# Patient Record
Sex: Female | Born: 1965
Health system: Southern US, Community
[De-identification: ages and names within clinical notes are randomized; demographics above are authoritative.]

## PROBLEM LIST (undated history)

## (undated) DIAGNOSIS — G4730 Sleep apnea, unspecified: Secondary | ICD-10-CM

## (undated) DIAGNOSIS — J45909 Unspecified asthma, uncomplicated: Secondary | ICD-10-CM

## (undated) DIAGNOSIS — R600 Localized edema: Secondary | ICD-10-CM

## (undated) DIAGNOSIS — E7800 Pure hypercholesterolemia, unspecified: Secondary | ICD-10-CM

## (undated) DIAGNOSIS — K5900 Constipation, unspecified: Secondary | ICD-10-CM

## (undated) DIAGNOSIS — Z9221 Personal history of antineoplastic chemotherapy: Secondary | ICD-10-CM

## (undated) DIAGNOSIS — R634 Abnormal weight loss: Secondary | ICD-10-CM

## (undated) DIAGNOSIS — E559 Vitamin D deficiency, unspecified: Secondary | ICD-10-CM

## (undated) DIAGNOSIS — C50919 Malignant neoplasm of unspecified site of unspecified female breast: Secondary | ICD-10-CM

## (undated) DIAGNOSIS — M549 Dorsalgia, unspecified: Secondary | ICD-10-CM

## (undated) DIAGNOSIS — Z91018 Allergy to other foods: Secondary | ICD-10-CM

## (undated) DIAGNOSIS — F419 Anxiety disorder, unspecified: Secondary | ICD-10-CM

## (undated) DIAGNOSIS — N938 Other specified abnormal uterine and vaginal bleeding: Secondary | ICD-10-CM

## (undated) DIAGNOSIS — Z923 Personal history of irradiation: Secondary | ICD-10-CM

## (undated) DIAGNOSIS — Z8742 Personal history of other diseases of the female genital tract: Secondary | ICD-10-CM

## (undated) DIAGNOSIS — R112 Nausea with vomiting, unspecified: Secondary | ICD-10-CM

## (undated) DIAGNOSIS — M2550 Pain in unspecified joint: Secondary | ICD-10-CM

## (undated) DIAGNOSIS — I10 Essential (primary) hypertension: Secondary | ICD-10-CM

## (undated) DIAGNOSIS — R638 Other symptoms and signs concerning food and fluid intake: Secondary | ICD-10-CM

## (undated) DIAGNOSIS — R0602 Shortness of breath: Secondary | ICD-10-CM

## (undated) DIAGNOSIS — E669 Obesity, unspecified: Secondary | ICD-10-CM

## (undated) HISTORY — DX: Pure hypercholesterolemia, unspecified: E78.00

## (undated) HISTORY — DX: Nausea with vomiting, unspecified: R11.2

## (undated) HISTORY — DX: Sleep apnea, unspecified: G47.30

## (undated) HISTORY — DX: Malignant neoplasm of unspecified site of unspecified female breast: C50.919

## (undated) HISTORY — DX: Allergy to other foods: Z91.018

## (undated) HISTORY — DX: Other specified abnormal uterine and vaginal bleeding: N93.8

## (undated) HISTORY — DX: Pain in unspecified joint: M25.50

## (undated) HISTORY — DX: Obesity, unspecified: E66.9

## (undated) HISTORY — DX: Shortness of breath: R06.02

## (undated) HISTORY — PX: REDUCTION MAMMAPLASTY: SUR839

## (undated) HISTORY — DX: Anxiety disorder, unspecified: F41.9

## (undated) HISTORY — DX: Dorsalgia, unspecified: M54.9

## (undated) HISTORY — DX: Constipation, unspecified: K59.00

## (undated) HISTORY — DX: Abnormal weight loss: R63.4

## (undated) HISTORY — DX: Personal history of other diseases of the female genital tract: Z87.42

## (undated) HISTORY — DX: Localized edema: R60.0

## (undated) HISTORY — PX: BREAST LUMPECTOMY: SHX2

## (undated) HISTORY — DX: Other symptoms and signs concerning food and fluid intake: R63.8

## (undated) HISTORY — DX: Unspecified asthma, uncomplicated: J45.909

## (undated) HISTORY — DX: Vitamin D deficiency, unspecified: E55.9

## (undated) MED FILL — Dexamethasone Sodium Phosphate Inj 100 MG/10ML: INTRAMUSCULAR | Qty: 1 | Status: AC

## (undated) MED FILL — Potassium Chloride Inj 10 mEq/100ML: INTRAVENOUS | Qty: 100 | Status: AC

## (undated) MED FILL — Fosaprepitant Dimeglumine For IV Infusion 150 MG (Base Eq): INTRAVENOUS | Qty: 5 | Status: AC

---

## 1991-05-04 HISTORY — PX: BREAST REDUCTION SURGERY: SHX8

## 1997-10-28 ENCOUNTER — Other Ambulatory Visit: Admission: RE | Admit: 1997-10-28 | Discharge: 1997-10-28 | Payer: Self-pay | Admitting: Obstetrics

## 1998-01-05 ENCOUNTER — Emergency Department (HOSPITAL_COMMUNITY): Admission: EM | Admit: 1998-01-05 | Discharge: 1998-01-06 | Payer: Self-pay | Admitting: Emergency Medicine

## 1998-02-12 ENCOUNTER — Inpatient Hospital Stay (HOSPITAL_COMMUNITY): Admission: AD | Admit: 1998-02-12 | Discharge: 1998-02-12 | Payer: Self-pay | Admitting: Obstetrics

## 1999-03-14 ENCOUNTER — Emergency Department (HOSPITAL_COMMUNITY): Admission: EM | Admit: 1999-03-14 | Discharge: 1999-03-15 | Payer: Self-pay | Admitting: Emergency Medicine

## 1999-09-20 ENCOUNTER — Encounter: Payer: Self-pay | Admitting: Obstetrics

## 1999-09-20 ENCOUNTER — Inpatient Hospital Stay (HOSPITAL_COMMUNITY): Admission: EM | Admit: 1999-09-20 | Discharge: 1999-09-20 | Payer: Self-pay | Admitting: *Deleted

## 1999-10-03 ENCOUNTER — Encounter: Admission: RE | Admit: 1999-10-03 | Discharge: 1999-10-03 | Payer: Self-pay | Admitting: Obstetrics

## 2000-11-02 ENCOUNTER — Emergency Department (HOSPITAL_COMMUNITY): Admission: EM | Admit: 2000-11-02 | Discharge: 2000-11-02 | Payer: Self-pay | Admitting: Emergency Medicine

## 2000-11-05 ENCOUNTER — Other Ambulatory Visit: Admission: RE | Admit: 2000-11-05 | Discharge: 2000-11-05 | Payer: Self-pay | Admitting: Family Medicine

## 2000-11-14 ENCOUNTER — Encounter: Admission: RE | Admit: 2000-11-14 | Discharge: 2000-11-14 | Payer: Self-pay | Admitting: Family Medicine

## 2000-11-14 ENCOUNTER — Encounter: Payer: Self-pay | Admitting: Family Medicine

## 2000-11-21 ENCOUNTER — Encounter: Payer: Self-pay | Admitting: Family Medicine

## 2000-11-21 ENCOUNTER — Ambulatory Visit (HOSPITAL_COMMUNITY): Admission: RE | Admit: 2000-11-21 | Discharge: 2000-11-21 | Payer: Self-pay | Admitting: Family Medicine

## 2001-11-08 ENCOUNTER — Encounter: Payer: Self-pay | Admitting: Emergency Medicine

## 2001-11-08 ENCOUNTER — Emergency Department (HOSPITAL_COMMUNITY): Admission: EM | Admit: 2001-11-08 | Discharge: 2001-11-08 | Payer: Self-pay | Admitting: Emergency Medicine

## 2003-02-11 ENCOUNTER — Other Ambulatory Visit: Admission: RE | Admit: 2003-02-11 | Discharge: 2003-02-11 | Payer: Self-pay | Admitting: Family Medicine

## 2003-07-10 ENCOUNTER — Emergency Department (HOSPITAL_COMMUNITY): Admission: EM | Admit: 2003-07-10 | Discharge: 2003-07-10 | Payer: Self-pay | Admitting: Emergency Medicine

## 2004-06-16 ENCOUNTER — Emergency Department (HOSPITAL_COMMUNITY): Admission: EM | Admit: 2004-06-16 | Discharge: 2004-06-16 | Payer: Self-pay | Admitting: Emergency Medicine

## 2005-01-18 ENCOUNTER — Inpatient Hospital Stay (HOSPITAL_COMMUNITY): Admission: AD | Admit: 2005-01-18 | Discharge: 2005-01-19 | Payer: Self-pay | Admitting: Obstetrics & Gynecology

## 2005-03-01 ENCOUNTER — Other Ambulatory Visit: Admission: RE | Admit: 2005-03-01 | Discharge: 2005-03-01 | Payer: Self-pay | Admitting: Family Medicine

## 2005-03-01 ENCOUNTER — Ambulatory Visit: Payer: Self-pay | Admitting: Family Medicine

## 2005-03-05 DIAGNOSIS — N938 Other specified abnormal uterine and vaginal bleeding: Secondary | ICD-10-CM

## 2005-03-05 DIAGNOSIS — E669 Obesity, unspecified: Secondary | ICD-10-CM

## 2005-03-05 DIAGNOSIS — Z8742 Personal history of other diseases of the female genital tract: Secondary | ICD-10-CM

## 2005-03-05 HISTORY — DX: Other specified abnormal uterine and vaginal bleeding: N93.8

## 2005-03-05 HISTORY — DX: Personal history of other diseases of the female genital tract: Z87.42

## 2005-03-05 HISTORY — DX: Obesity, unspecified: E66.9

## 2005-03-22 ENCOUNTER — Ambulatory Visit (HOSPITAL_COMMUNITY): Admission: RE | Admit: 2005-03-22 | Discharge: 2005-03-22 | Payer: Self-pay | Admitting: Family Medicine

## 2005-03-29 ENCOUNTER — Ambulatory Visit: Payer: Self-pay | Admitting: Family Medicine

## 2005-03-31 DIAGNOSIS — I10 Essential (primary) hypertension: Secondary | ICD-10-CM

## 2005-03-31 HISTORY — DX: Essential (primary) hypertension: I10

## 2005-05-13 ENCOUNTER — Emergency Department (HOSPITAL_COMMUNITY): Admission: EM | Admit: 2005-05-13 | Discharge: 2005-05-13 | Payer: Self-pay | Admitting: Family Medicine

## 2006-02-04 ENCOUNTER — Emergency Department (HOSPITAL_COMMUNITY): Admission: EM | Admit: 2006-02-04 | Discharge: 2006-02-04 | Payer: Self-pay | Admitting: Emergency Medicine

## 2006-04-30 ENCOUNTER — Ambulatory Visit: Admission: AD | Admit: 2006-04-30 | Discharge: 2006-04-30 | Payer: Self-pay | Admitting: Obstetrics and Gynecology

## 2006-04-30 ENCOUNTER — Encounter (INDEPENDENT_AMBULATORY_CARE_PROVIDER_SITE_OTHER): Payer: Self-pay | Admitting: Specialist

## 2006-05-01 DIAGNOSIS — Z8742 Personal history of other diseases of the female genital tract: Secondary | ICD-10-CM

## 2006-05-01 HISTORY — DX: Personal history of other diseases of the female genital tract: Z87.42

## 2006-05-01 HISTORY — PX: HYSTEROSCOPY: SHX211

## 2006-06-11 ENCOUNTER — Emergency Department (HOSPITAL_COMMUNITY): Admission: EM | Admit: 2006-06-11 | Discharge: 2006-06-11 | Payer: Self-pay | Admitting: Family Medicine

## 2006-07-11 DIAGNOSIS — R638 Other symptoms and signs concerning food and fluid intake: Secondary | ICD-10-CM

## 2006-07-11 DIAGNOSIS — R634 Abnormal weight loss: Secondary | ICD-10-CM

## 2006-07-11 HISTORY — DX: Abnormal weight loss: R63.4

## 2006-07-11 HISTORY — DX: Other symptoms and signs concerning food and fluid intake: R63.8

## 2006-08-10 ENCOUNTER — Encounter: Admission: RE | Admit: 2006-08-10 | Discharge: 2006-08-10 | Payer: Self-pay | Admitting: Family Medicine

## 2006-09-24 ENCOUNTER — Emergency Department (HOSPITAL_COMMUNITY): Admission: EM | Admit: 2006-09-24 | Discharge: 2006-09-24 | Payer: Self-pay | Admitting: Emergency Medicine

## 2009-05-16 ENCOUNTER — Encounter: Admission: RE | Admit: 2009-05-16 | Discharge: 2009-05-16 | Payer: Self-pay | Admitting: Obstetrics and Gynecology

## 2010-05-11 ENCOUNTER — Inpatient Hospital Stay (INDEPENDENT_AMBULATORY_CARE_PROVIDER_SITE_OTHER)
Admission: RE | Admit: 2010-05-11 | Discharge: 2010-05-11 | Disposition: A | Discharge status: Home / Self Care | Payer: 59 | Source: Ambulatory Visit | Attending: Family Medicine | Admitting: Family Medicine

## 2010-05-11 ENCOUNTER — Emergency Department (HOSPITAL_COMMUNITY): Payer: 59

## 2010-05-11 ENCOUNTER — Emergency Department (HOSPITAL_COMMUNITY)
Admission: EM | Admit: 2010-05-11 | Discharge: 2010-05-11 | Disposition: A | Discharge status: Home / Self Care | Payer: 59 | Attending: Emergency Medicine | Admitting: Emergency Medicine

## 2010-05-11 DIAGNOSIS — M6281 Muscle weakness (generalized): Secondary | ICD-10-CM

## 2010-05-11 DIAGNOSIS — G43909 Migraine, unspecified, not intractable, without status migrainosus: Secondary | ICD-10-CM | POA: Insufficient documentation

## 2010-05-11 DIAGNOSIS — Z79899 Other long term (current) drug therapy: Secondary | ICD-10-CM | POA: Insufficient documentation

## 2010-05-11 DIAGNOSIS — G43009 Migraine without aura, not intractable, without status migrainosus: Secondary | ICD-10-CM

## 2010-05-11 DIAGNOSIS — R112 Nausea with vomiting, unspecified: Secondary | ICD-10-CM | POA: Insufficient documentation

## 2010-05-11 DIAGNOSIS — I10 Essential (primary) hypertension: Secondary | ICD-10-CM | POA: Insufficient documentation

## 2010-05-11 DIAGNOSIS — H9319 Tinnitus, unspecified ear: Secondary | ICD-10-CM | POA: Insufficient documentation

## 2010-05-11 DIAGNOSIS — H53149 Visual discomfort, unspecified: Secondary | ICD-10-CM | POA: Insufficient documentation

## 2010-05-11 LAB — DIFFERENTIAL
Lymphocytes Relative: 22 % (ref 12–46)
Lymphs Abs: 2.4 10*3/uL (ref 0.7–4.0)
Monocytes Relative: 8 % (ref 3–12)
Neutro Abs: 7.3 10*3/uL (ref 1.7–7.7)

## 2010-05-11 LAB — POCT I-STAT, CHEM 8
Chloride: 108 mEq/L (ref 96–112)
Creatinine, Ser: 1.1 mg/dL (ref 0.4–1.2)
HCT: 37 % (ref 36.0–46.0)
Potassium: 4.4 mEq/L (ref 3.5–5.1)
Sodium: 136 mEq/L (ref 135–145)

## 2010-05-11 LAB — URINALYSIS, ROUTINE W REFLEX MICROSCOPIC: Ketones, ur: NEGATIVE mg/dL

## 2010-05-11 LAB — CBC
HCT: 37 % (ref 36.0–46.0)
MCHC: 32.7 g/dL (ref 30.0–36.0)
Platelets: 234 10*3/uL (ref 150–400)

## 2010-07-21 NOTE — Group Therapy Note (Signed)
NAME:  Pamela [name], Pamela [name] NO.:  [id number]   MEDICAL RECORD NO.:  [id number]           PATIENT TYPE:   LOCATION:  [facility]                     FACILITY:   PHYSICIAN:  Argentina Donovan, MD             DATE OF BIRTH:   DATE OF SERVICE:  03/31/2005                                    CLINIC NOTE   CHIEF COMPLAINT:  Follow-up laboratory tests and ultrasound.   SUBJECTIVE:  Patient is a 45 year old gravida 4, para 2-0-2-2 who had been  seen in clinic on March 01, 2005 with abnormal uterine bleeding.  This  was typified by menometrorrhagia.  In the past she had tried birth control  pills with limited success.  In November she had had very heavy bleeding,  however, had been started on progesterone but she stated the progesterone  did not work for her and she had three months of almost continuous spotting.  This prompted her to come to the GYN clinic last month.  At that time the  patient underwent an endometrial biopsy, some laboratory work, and an  ultrasound.   The results of these tests are as follows:  The endometrial biopsy shows no  evidence of a malignancy.  It does show proliferative type endometrial  glands.  She had a TSH which was normal and an FSH which was 5.6 and normal  and not premenopausal.  Patient underwent a repeat ultrasound.  This was  done because the patient on her previous ultrasound obtained when she went  to the emergency department in November showed a thickened heterogeneous  endometrium and also a 2.7 x 2.4 x 2.6 cm complex cyst in the right ovary.  The follow-up ultrasound shows a normal appearance of the uterus and both  ovaries.  This shows a resolution of the right ovarian cyst.  It also shows  a thin endometrium of 4 mm.   The patient has been on Ortho-Cyclen since we saw her in clinic last.  She  states that the bleeding has subsequently stopped now that she has been on  the birth control pills for the last two months.  She is currently  having no  problems and is having normal cycles.   OBJECTIVE:  VITAL SIGNS:  Temperature 99.2, pulse 79, blood pressure  149/100, weight 280.2 pounds, height 5 feet 7.5 inches.   Physical examination was deferred.  Spent approximately 20 minutes in  discussion with the patient about her laboratory results.   IMPRESSION:  1.  Dysfunctional uterine bleeding secondary to hormonal imbalance.  2.  Obesity.  3.  Hypertension.  4.  Ovarian cysts.   PLAN:  Abnormal uterine bleeding seems to be controlled by her Ortho-Cyclen.  Instructed the patient to continue the pills as directed.  Instructed that  if in the future patient has problems with abnormal vaginal bleeding that is  not regulated by birth control pills we can try other types of hormonal  methods and if that is insufficient could pursue options such as endometrial  ablation.  Advised the patient to come back as needed for abnormal bleeding,  but  continue birth control pills at the present time.  Ovarian cysts:  This is  resolved on the subsequent follow-up ultrasound and needs no further follow-  up.     ______________________________  Tanya Nones, M.D.    ______________________________  Argentina Donovan, MD    /MEDQ  D:  03/29/2005  T:  03/30/2005  Job:  [id number]

## 2010-07-21 NOTE — Op Note (Signed)
NAME:  Pamela [name], Pamela [name]            ACCOUNT NO.:  [id number]   MEDICAL RECORD NO.:  [id number]          PATIENT TYPE:  AMB   LOCATION:  [facility]                          FACILITY:  [facility]   PHYSICIAN:  Osborn Coho, M.D.   DATE OF BIRTH:  18-Sep-1965   DATE OF PROCEDURE:  05/01/2006  DATE OF DISCHARGE:                               OPERATIVE REPORT   PREOPERATIVE DIAGNOSIS:  Menorrhagia.   POSTOPERATIVE DIAGNOSIS:  Menorrhagia.   PROCEDURE:  1. Hysteroscopy.  2. Dilation and curettage.   ATTENDING:  Osborn Coho, M.D.   ANESTHESIA:  General via LMA.   SPECIMENS TO PATHOLOGY:  Endometrial curettings.   FLUID:  600 mL.   ESTIMATED BLOOD LOSS:  Minimal.   URINE OUTPUT:  Quantity sufficient via straight cath prior to procedure.   HYSTEROSCOPIC FLUID DEFICIT:  Sorbitol 60 mL   COMPLICATIONS:  None.   PROCEDURE:  The patient was taken to the operating room after risks,  benefits, and alternatives reviewed with the patient.  The patient  verbalized understanding, consent signed and witnessed.  The patient was  placed under general anesthesia and prepped and draped in the normal  sterile fashion in the dorsal lithotomy position.  A bivalve speculum  placed in the patient's vagina and a paracervical block administered.  The cervix was dilated for passage of the diagnostic hysteroscope after  sounding the uterus to 9 cm.  Diagnostic hysteroscope was introduced and  the patient was bleeding a fair amount and visualization was not  optimal; however, no obvious lesions were noted.  Curettage was  performed until a gritty texture noted and curettings sent to pathology.  The diagnostic hysteroscope  introduced again and slightly better visualization and still no obvious  intracavitary lesions.  All instruments were removed.  There was good  hemostasis at tenaculum site.  There was minimal bleeding.  The patient  tolerated procedure well.  Count was correct and the patient was  returned  to recovery room in good condition.      Osborn Coho, M.D.  Electronically Signed     AR/MEDQ  D:  05/01/2006  T:  05/01/2006  Job:  [id number]

## 2010-07-21 NOTE — Group Therapy Note (Signed)
NAME:  Pamela [name], Pamela [name] NO.:  [id number]   MEDICAL RECORD NO.:  [id number]          PATIENT TYPE:  WOC   LOCATION:  [facility]                   FACILITY:  [facility]   PHYSICIAN:  Tinnie Gens, MD        DATE OF BIRTH:  Nov 09, 1965   DATE OF SERVICE:                                    CLINIC NOTE   CHIEF COMPLAINT:  Heavy vaginal bleeding.   HISTORY OF PRESENT ILLNESS:  Patient is a 45 year old gravida 4, para 2-0-2-  2, who has previously had very regular cycles that came once a month, lasted  for five days, since age 5.  Since October, the patient has had  increasingly heavy cycles with abnormal bleeding.  She was seen at the  health department in November of this year and given a pack of pills along  with a Pap smear for her abnormal bleeding, which did not really help.  Her  bleeding continued.  She came in to the [facility] on November 16 and had  heavy bleeding and was started on Megace, which stopped it for five days,  then she could no longer afford that.  She was switched to Provera, and she  continued to have bleeding all through the month of November and most of  December, and it just stopped approximately three days ago.  This patient  does report having a history of this in the past.  She has had problems once  or twice in her lifetime with heavy cycles and abnormal flow, but then it  goes back to regular monthly cycles for her.  She denies any significant  weight change recently or any extra stress.  The patient does have some  light-headedness and dizziness, headaches upon standing, which she  attributes to her abnormal bleeding.   PAST MEDICAL HISTORY:  Significant for asthma, elevated blood pressure, and  elevated cholesterol for which she takes no medications.   PAST SURGICAL HISTORY:  She had a breast reduction and a C-section.   MEDICATIONS:  Ortho-Cyclen and multivitamin.   ALLERGIES:  IODINE and LIQUID CODEINE.   OBSTETRIC HISTORY:  She is a  G4, P2.  She has one vaginal delivery followed  by a C-section for fetal indications.   GYNECOLOGIC HISTORY:  Last Pap was November of 2006.  It was normal.  She  has no history of an abnormal Pap.   FAMILY HISTORY:  Diabetes, hypertension, breast and bone cancer in a  grandmother and aunt and her grandfather.   SOCIAL HISTORY:  No tobacco, alcohol, or drug use.  She is employed.  She  lives with her son and daughter.   REVIEW OF SYMPTOMS:  Fourteen point review of systems reviewed.  Please see  GYN history on the chart.  She has noted one bruise that stayed around  longer than usual.  Otherwise, the review of systems is reviewed in the HPI.   PHYSICAL EXAMINATION:  VITAL SIGNS:  Her blood pressure is 135/80, weight is  277.7, pulse is 68, temp 98.5, height 5 feet 7 and 1/2 inches tall.  GENERAL:  She is an obese black female  in no acute distress.  ABDOMEN:  Soft, non-tender, and non-distended.  No masses.  GENITOURINARY:  She has normal external female genitalia.  The vagina is  pink and rugated.  Cervix is parous and without lesion.  The uterus is very  firm, anteverted, and it feels like there may be an anterior fibroid.  The  adnexa could not be palpated secondary to body habitus.   Records are reviewed.  The patient had a thick heterogeneous endometrium and  a complex cyst on the right ovary on her January 19, 2005, pelvic  ultrasound.  She also had a hemoglobin of 10.2 in November as well.   PROCEDURE:  The cervix was cleaned with Betadine x2.  The uterus sounded to  approximately 9 cm.  Endometrial biopsy was obtained without difficulty.   IMPRESSION:  1.  Abnormal uterine bleeding.  2.  Obesity.  3.  History of hypertension.  4.  History of elevated cholesterol.  5.  Ovarian cyst.   PLAN:  1.  Endometrial biopsy today.  2.  Check TSH and FSH today.  3.  Follow-up in four weeks for a discussion of results and +/- any      treatments that are indicated.  4.  Will  follow up her pelvic ultrasound in two weeks.           ______________________________  Tinnie Gens, MD     TP/MEDQ  D:  03/01/2005  T:  03/01/2005  Job:  [id number]

## 2011-01-18 ENCOUNTER — Encounter (HOSPITAL_COMMUNITY): Payer: Self-pay | Admitting: Nurse Practitioner

## 2011-01-18 ENCOUNTER — Encounter: Payer: Self-pay | Admitting: *Deleted

## 2011-01-18 ENCOUNTER — Emergency Department (HOSPITAL_COMMUNITY)
Admission: EM | Admit: 2011-01-18 | Discharge: 2011-01-18 | Disposition: A | Discharge status: Home / Self Care | Payer: 59 | Attending: Emergency Medicine | Admitting: Emergency Medicine

## 2011-01-18 ENCOUNTER — Emergency Department (INDEPENDENT_AMBULATORY_CARE_PROVIDER_SITE_OTHER)
Admission: EM | Admit: 2011-01-18 | Discharge: 2011-01-18 | Disposition: A | Discharge status: Other Acute Inpatient Hospital | Payer: 59 | Source: Home / Self Care | Attending: Family Medicine | Admitting: Family Medicine

## 2011-01-18 DIAGNOSIS — R51 Headache: Secondary | ICD-10-CM

## 2011-01-18 DIAGNOSIS — G43909 Migraine, unspecified, not intractable, without status migrainosus: Secondary | ICD-10-CM | POA: Insufficient documentation

## 2011-01-18 DIAGNOSIS — I10 Essential (primary) hypertension: Secondary | ICD-10-CM

## 2011-01-18 DIAGNOSIS — H53149 Visual discomfort, unspecified: Secondary | ICD-10-CM | POA: Insufficient documentation

## 2011-01-18 DIAGNOSIS — R519 Headache, unspecified: Secondary | ICD-10-CM

## 2011-01-18 HISTORY — DX: Essential (primary) hypertension: I10

## 2011-01-18 MED ORDER — DEXAMETHASONE SODIUM PHOSPHATE 10 MG/ML IJ SOLN
10.0000 mg | Freq: Once | INTRAMUSCULAR | Status: AC
  Administered 2011-01-18: 10 mg via INTRAVENOUS
  Filled 2011-01-18: qty 1

## 2011-01-18 MED ORDER — METOCLOPRAMIDE HCL 5 MG/ML IJ SOLN
10.0000 mg | Freq: Once | INTRAMUSCULAR | Status: AC
  Administered 2011-01-18: 10 mg via INTRAVENOUS
  Filled 2011-01-18: qty 2

## 2011-01-18 MED ORDER — CLONIDINE HCL 0.1 MG PO TABS
0.1000 mg | ORAL_TABLET | Freq: Once | ORAL | Status: AC
  Administered 2011-01-18: 0.1 mg via ORAL
  Filled 2011-01-18: qty 1

## 2011-01-18 MED ORDER — ONDANSETRON 4 MG PO TBDP
4.0000 mg | ORAL_TABLET | Freq: Once | ORAL | Status: AC
  Administered 2011-01-18: 4 mg via ORAL

## 2011-01-18 MED ORDER — ONDANSETRON 4 MG PO TBDP
ORAL_TABLET | ORAL | Status: AC
  Filled 2011-01-18: qty 1

## 2011-01-18 MED ORDER — AMLODIPINE BESYLATE 5 MG PO TABS
5.0000 mg | ORAL_TABLET | Freq: Once | ORAL | Status: AC
  Administered 2011-01-18: 5 mg via ORAL
  Filled 2011-01-18: qty 1

## 2011-01-18 MED ORDER — DIPHENHYDRAMINE HCL 50 MG/ML IJ SOLN
25.0000 mg | Freq: Once | INTRAMUSCULAR | Status: AC
  Administered 2011-01-18: 25 mg via INTRAVENOUS
  Filled 2011-01-18: qty 1

## 2011-01-18 NOTE — ED Notes (Signed)
ASSUMED CARE ON PT. NO PAIN OR DISCOMFORT AT THIS TIME, RESPIRATIONS UNLABORED, INTRODUCED SELF , CALL LIGHT WITHIN REACH , BEDRAILS UP.

## 2011-01-18 NOTE — ED Notes (Signed)
Pt c/o migraine since 0300 today. Hx of migraines. Went to [facility] for treatment and they sent for HTN. Pt reports she has been taking BP meds as prescribed but did miss todays dose. No cp or SOB

## 2011-01-18 NOTE — ED Notes (Signed)
Report    Phoned  To  Paulino Rily

## 2011-01-18 NOTE — ED Provider Notes (Signed)
History     CSN: [id number] Arrival date & time: 01/18/2011  3:00 PM   First MD Initiated Contact with Patient 01/18/11 1523      Chief Complaint  Patient presents with  . Migraine    (Consider location/radiation/quality/duration/timing/severity/associated sxs/prior treatment) HPI Comments: Seen at urgent care as pt was unable to see her PCP and due to BP was sent here.  Patient is a 45 y.o. female presenting with migraine. The history is provided by the patient.  Migraine This is a recurrent problem. The current episode started 6 to 12 hours ago. The problem occurs constantly. The problem has been gradually worsening. Associated symptoms include headaches. Pertinent negatives include no chest pain and no shortness of breath. Exacerbated by: light, loud noise. The symptoms are relieved by nothing. She has tried acetaminophen, rest and a cold compress for the symptoms. The treatment provided no relief.    Past Medical History  Diagnosis Date  . Hypertension   . Migraine     Past Surgical History  Procedure Date  . Cesarean section   . Breast reduction surgery     History reviewed. No pertinent family history.  History  Substance Use Topics  . Smoking status: Never Smoker   . Smokeless tobacco: Not on file  . Alcohol Use: No    OB History    Grav Para Term Preterm Abortions TAB SAB Ect Mult Living                  Review of Systems  Eyes: Positive for photophobia. Negative for visual disturbance.  Respiratory: Negative for shortness of breath.   Cardiovascular: Negative for chest pain.  Neurological: Positive for headaches. Negative for speech difficulty, weakness, light-headedness and numbness.  All other systems reviewed and are negative.    Allergies  Codeine and Shellfish allergy  Home Medications   Current Outpatient Rx  Name Route Sig Dispense Refill  . AMLODIPINE-OLMESARTAN 5-40 MG PO TABS Oral Take 1 tablet by mouth daily.      Marland Kitchen VICTOZA [place]  Subcutaneous Inject 1.6 mg into the skin daily.     Marland Kitchen VITAMIN B-6 25 MG PO TABS Oral Take 25 mg by mouth 2 (two) times a week.      Marland Kitchen ROSUVASTATIN CALCIUM 40 MG PO TABS Oral Take 40 mg by mouth daily.        BP 219/106  Pulse 59  Temp(Src) 98.2 F (36.8 C) (Oral)  Ht 5\' 8"  (1.727 m)  Wt 307 lb (139.254 kg)  BMI 46.68 kg/m2  SpO2 98%  Physical Exam  Nursing note and vitals reviewed. Constitutional: She is oriented to person, place, and time. She appears well-developed and well-nourished. She appears distressed.  HENT:  Head: Normocephalic and atraumatic.  Eyes: EOM are normal. Pupils are equal, round, and reactive to light.  Fundoscopic exam:      The right eye shows no papilledema.       The left eye shows no papilledema.  Neck: Normal range of motion. Neck supple.  Cardiovascular: Normal rate, regular rhythm, normal heart sounds and intact distal pulses.  Exam reveals no friction rub.   No murmur heard. Pulmonary/Chest: Effort normal and breath sounds normal. She has no wheezes. She has no rales.  Abdominal: Soft. Bowel sounds are normal. She exhibits no distension. There is no tenderness. There is no rebound and no guarding.  Musculoskeletal: Normal range of motion. She exhibits no tenderness.       No edema  Lymphadenopathy:  She has no cervical adenopathy.  Neurological: She is alert and oriented to person, place, and time. She has normal strength. No cranial nerve deficit or sensory deficit. Gait normal.       photophobia  Skin: Skin is warm and dry. No rash noted.  Psychiatric: She has a normal mood and affect. Her behavior is normal.    ED Course  Procedures (including critical care time)  Labs Reviewed - No data to display No results found.   No diagnosis found.    MDM   Pt with typical migraine HA without sx suggestive of SAH(sudden onset, worst of life, or deficits), infection, or cavernous vein thrombosis.  Normal neuro exam however pt HTN today and  sent from urgent care due to HTN.  Pt did not take meds this am.  States usually doesn't get HA with blood pressure.   Will give HA cocktail and re-eval.  Denies CP, SOB or other complaints.  Will check Head CT to r/o other reason for migraine due to BP.  4:49 PM After HA cocktail states HA improved but pt experiencing a dystonic reaction from reglan but will not let us give more benadryl and wants the iV out.  Will allow her to calm down.   6:14 PM Pt refusing head CT.  BP still elevated and will give something else for BP clonidine to see if improves.  HA improved with cocktail.  8:01 PM Repeat BP much improved at 150's after clonidine and pt feeling better. Will d/c home.  Gwyneth Sprout, MD 01/18/11 2001

## 2011-01-18 NOTE — ED Notes (Signed)
Pt refused CT  

## 2011-01-18 NOTE — ED Notes (Signed)
Pt became anxious after receiving iv meds. Pt states "i want to leave" "take this out" explained to pt risks of leaving ama. Pt verbalized understanding. Dr Anitra Lauth at bedside to speak with pt. Pt now deciding to stay but wants iv out. Iv removed. Pt a little calmer now and resting quietly in bed.

## 2011-01-18 NOTE — ED Notes (Signed)
Pt  Reports   History  Of migraines     -  Pt  Reports  She  Was  Awake  By headache  In middle of night       -     Pain is  Worse     Nausea    -  Vomited    X  1  -  Photophobia  Present             Pt did    Not   Take  bp  Med  Today      She  Is  Awake  And  Alert  Oriented

## 2011-01-18 NOTE — ED Notes (Signed)
Pt resting quietly in bed. No distress noted

## 2011-01-18 NOTE — ED Provider Notes (Signed)
History     CSN: [id number] Arrival date & time: 01/18/2011  1:20 PM   First MD Initiated Contact with Patient 01/18/11 1338      Chief Complaint  Patient presents with  . Headache    (Consider location/radiation/quality/duration/timing/severity/associated sxs/prior treatment) Patient is a 45 y.o. female presenting with headaches. The history is provided by the patient.  Headache The primary symptoms include headaches. The symptoms began 6 to 12 hours ago (awoke during night with headache, sx continue and hbp highest ever.). The symptoms are unchanged. Focality: localized across forehead and eyes. The symptoms occurred while sleeping.  The headache is associated with photophobia.  Additional symptoms include photophobia. Associated symptoms comments: Nausea and watery eyes..    Past Medical History  Diagnosis Date  . Hypertension   . Migraine     Past Surgical History  Procedure Date  . Cesarean section   . Breast reduction surgery     No family history on file.  History  Substance Use Topics  . Smoking status: Never Smoker   . Smokeless tobacco: Not on file  . Alcohol Use: No    OB History    Grav Para Term Preterm Abortions TAB SAB Ect Mult Living                  Review of Systems  Eyes: Positive for photophobia.  Neurological: Positive for headaches.    Allergies  Codeine and Shellfish allergy  Home Medications   Current Outpatient Rx  Name Route Sig Dispense Refill  . AMLODIPINE-OLMESARTAN 5-40 MG PO TABS Oral Take 1 tablet by mouth daily.      Marland Kitchen VICTOZA [place] Subcutaneous Inject into the skin.      Marland Kitchen VITAMIN B-6 25 MG PO TABS Oral Take 25 mg by mouth 2 (two) times a week.      Marland Kitchen ROSUVASTATIN CALCIUM 40 MG PO TABS Oral Take 40 mg by mouth daily.        BP 226/122  Pulse 65  Temp(Src) 98.2 F (36.8 C) (Oral)  Resp 20  SpO2 100%  Physical Exam  ED Course  Procedures (including critical care time)  Labs Reviewed - No data to display No  results found.   No diagnosis found.    MDM         Barkley Bruns, MD 01/18/11 [id number]

## 2011-02-14 ENCOUNTER — Emergency Department (INDEPENDENT_AMBULATORY_CARE_PROVIDER_SITE_OTHER)
Admission: EM | Admit: 2011-02-14 | Discharge: 2011-02-14 | Disposition: A | Discharge status: Home / Self Care | Payer: 59 | Source: Home / Self Care | Attending: Emergency Medicine | Admitting: Emergency Medicine

## 2011-02-14 ENCOUNTER — Encounter (HOSPITAL_COMMUNITY): Payer: Self-pay

## 2011-02-14 DIAGNOSIS — J069 Acute upper respiratory infection, unspecified: Secondary | ICD-10-CM

## 2011-02-14 DIAGNOSIS — J029 Acute pharyngitis, unspecified: Secondary | ICD-10-CM

## 2011-02-14 MED ORDER — PREDNISONE 10 MG PO TABS: 20.0000 mg | ORAL_TABLET | Freq: Every day | ORAL | Status: DC

## 2011-02-14 NOTE — ED Notes (Signed)
Was reportedly seen at [facility] and was told she has strep , and was given shot and Rx for antibiotics for her syx; c/o she does not feel any better

## 2011-02-15 ENCOUNTER — Encounter (HOSPITAL_COMMUNITY): Payer: Self-pay | Admitting: Emergency Medicine

## 2011-02-15 NOTE — ED Provider Notes (Signed)
History     CSN: [id number] Arrival date & time: 02/14/2011  8:12 PM   First MD Initiated Contact with Patient 02/14/11 2014      Chief Complaint  Patient presents with  . Sore Throat    (Consider location/radiation/quality/duration/timing/severity/associated sxs/prior treatment) HPI Comments: SEEN BY PCP DIAGNOSED WITH STREP, BUT SORE THROAT NOT GOING AWAY DESPITE ANTIBIOTIC, COUGHING AND CONGSTED  Patient is a 45 y.o. female presenting with pharyngitis. The history is provided by the patient.  Sore Throat The current episode started more than 1 week ago. The problem occurs constantly. The problem has not changed since onset.Pertinent negatives include no chest pain, no abdominal pain and no headaches. The symptoms are aggravated by swallowing. She has tried nothing for the symptoms.    Past Medical History  Diagnosis Date  . Hypertension   . Migraine     Past Surgical History  Procedure Date  . Cesarean section   . Breast reduction surgery     History reviewed. No pertinent family history.  History  Substance Use Topics  . Smoking status: Never Smoker   . Smokeless tobacco: Not on file  . Alcohol Use: No    OB History    Grav Para Term Preterm Abortions TAB SAB Ect Mult Living                  Review of Systems  Constitutional: Positive for fever and fatigue.  HENT: Positive for congestion and rhinorrhea.   Eyes: Negative for photophobia and redness.  Cardiovascular: Negative for chest pain.  Gastrointestinal: Negative for abdominal pain.  Skin: Negative.   Neurological: Negative for headaches.    Allergies  Codeine and Shellfish allergy  Home Medications   Current Outpatient Rx  Name Route Sig Dispense Refill  . AMLODIPINE-OLMESARTAN 5-40 MG PO TABS Oral Take 1 tablet by mouth daily.      Marland Kitchen VICTOZA [place] Subcutaneous Inject 1.6 mg into the skin daily.     Marland Kitchen PREDNISONE 10 MG PO TABS Oral Take 2 tablets (20 mg total) by mouth daily. X 5 days 15 tablet  0  . VITAMIN B-6 25 MG PO TABS Oral Take 25 mg by mouth 2 (two) times a week.      Marland Kitchen ROSUVASTATIN CALCIUM 40 MG PO TABS Oral Take 40 mg by mouth daily.        BP 137/86  Pulse 85  Temp(Src) 99.9 F (37.7 C) (Oral)  Resp 16  SpO2 99%  Physical Exam  Nursing note and vitals reviewed. Constitutional: She appears well-developed and well-nourished.  HENT:  Head: Normocephalic.  Right Ear: Tympanic membrane normal.  Left Ear: Tympanic membrane normal.  Nose: Nose normal.  Mouth/Throat: Uvula is midline, oropharynx is clear and moist and mucous membranes are normal.  Eyes: Conjunctivae are normal.  Neck: Neck supple.  Cardiovascular: Regular rhythm.   Pulmonary/Chest: Effort normal and breath sounds normal. No respiratory distress.  Abdominal: Soft.  Neurological: She is alert.  Skin: Skin is warm.    ED Course  Procedures (including critical care time)  Labs Reviewed - No data to display No results found.   1. Upper respiratory infection   2. Pharyngitis       MDM  ILI X PHARYNGITIS ON ABX FOR  +STEP AT [facility] OFFICE        Jimmie Molly, MD 02/15/11 0009

## 2011-08-06 ENCOUNTER — Telehealth: Payer: Self-pay | Admitting: Obstetrics and Gynecology

## 2011-08-07 NOTE — Telephone Encounter (Signed)
cht pulled/collections

## 2011-09-03 ENCOUNTER — Ambulatory Visit (INDEPENDENT_AMBULATORY_CARE_PROVIDER_SITE_OTHER): Payer: 59 | Admitting: Obstetrics and Gynecology

## 2011-09-03 ENCOUNTER — Encounter: Payer: Self-pay | Admitting: Obstetrics and Gynecology

## 2011-09-03 VITALS — BP 140/90 | HR 56 | Ht 68.0 in | Wt 309.0 lb

## 2011-09-03 DIAGNOSIS — Z113 Encounter for screening for infections with a predominantly sexual mode of transmission: Secondary | ICD-10-CM

## 2011-09-03 DIAGNOSIS — Z6841 Body Mass Index (BMI) 40.0 and over, adult: Secondary | ICD-10-CM | POA: Insufficient documentation

## 2011-09-03 DIAGNOSIS — E669 Obesity, unspecified: Secondary | ICD-10-CM

## 2011-09-03 DIAGNOSIS — Z124 Encounter for screening for malignant neoplasm of cervix: Secondary | ICD-10-CM

## 2011-09-03 NOTE — Progress Notes (Signed)
Last Pap: 07/16/08 WNL: Yes Regular Periods: yes Contraception: Mirena - inserted 05/20/06 Monthly Breast exam:yes Tetanus<62yrs:yes Nl.Bladder Function:yes Daily BMs:yes Healthy Diet:no Calcium:no Mammogram:yes Date of Mammogram: 05/16/09 Exercise:yes, Pt states she just started back  Have often Exercise: walking daily Seatbelt: yes Abuse at home: no Stressful work:yes Sigmoid-colonoscopy: n/a  Bone Density: Yes, pt states sometime in the last 2-3 years PCP: Dr. Dorothyann Peng Change in PMH: n/a Change in FMH:n/a  No complaints Due to have IUD removed and replaced Filed Vitals:   09/03/11 1146  BP: 140/90  Pulse: 56   ROS: noncontributory  Physical Examination: General appearance - alert, well appearing, and in no distress Neck - supple, no significant adenopathy Chest - clear to auscultation, no wheezes, rales or rhonchi, symmetric air entry Heart - normal rate and regular rhythm Abdomen - soft, nontender, nondistended, no masses or organomegaly Breasts - breasts appear normal, no suspicious masses, no skin or nipple changes or axillary nodes Pelvic - normal external genitalia, vulva, vagina, cervix, uterus and adnexa, IUD string visible (difficult exam secondary to habitus) Back exam - no CVAT Extremities - no edema, redness or tenderness in the calves or thighs  A/P Next available to remove and replace IUD Pap/GC/CT today with consent Mammo

## 2011-09-05 LAB — PAP IG, CT-NG, RFX HPV ASCU

## 2011-10-01 ENCOUNTER — Ambulatory Visit (INDEPENDENT_AMBULATORY_CARE_PROVIDER_SITE_OTHER): Payer: 59 | Admitting: Obstetrics and Gynecology

## 2011-10-01 ENCOUNTER — Encounter: Payer: Self-pay | Admitting: Obstetrics and Gynecology

## 2011-10-01 VITALS — BP 140/88 | Wt 305.0 lb

## 2011-10-01 DIAGNOSIS — Z975 Presence of (intrauterine) contraceptive device: Secondary | ICD-10-CM | POA: Insufficient documentation

## 2011-10-01 DIAGNOSIS — Z30433 Encounter for removal and reinsertion of intrauterine contraceptive device: Secondary | ICD-10-CM

## 2011-10-01 NOTE — Progress Notes (Signed)
UPT: negative GC/CHLAMYDIA: negative CONSENT SIGNED: yes DISINFECTION WITH betadine X3 UTERUS SOUNDED AT 9 CM IUD INSERTED PER PROTOCOL: yes COMPLICATION: none PATIENT INSTRUCTED TO CALL IS FEVER OR ABNORMAL PAIN:yes PATIENT INSTRUCTED ON HOW TO CHECK IUD STRINGS: yes FOLLOW UP APPT: 5wks  Prior to insertion, existing IUD was removed without difficulty.

## 2011-10-02 ENCOUNTER — Other Ambulatory Visit: Payer: Self-pay

## 2011-10-02 DIAGNOSIS — Z975 Presence of (intrauterine) contraceptive device: Secondary | ICD-10-CM

## 2011-10-02 MED ORDER — LEVONORGESTREL 20 MCG/24HR IU IUD
INTRAUTERINE_SYSTEM | Freq: Once | INTRAUTERINE | Status: AC
  Administered 2011-10-01: 1 via INTRAUTERINE

## 2011-10-02 NOTE — Addendum Note (Signed)
Addended by: Marla Roe A on: 10/02/2011 09:56 AM   Modules accepted: Orders

## 2011-11-07 NOTE — Telephone Encounter (Signed)
Encounter to close encounter.Pamela [name] A

## 2011-11-08 ENCOUNTER — Ambulatory Visit (INDEPENDENT_AMBULATORY_CARE_PROVIDER_SITE_OTHER): Payer: 59 | Admitting: Obstetrics and Gynecology

## 2011-11-08 ENCOUNTER — Encounter: Payer: Self-pay | Admitting: Obstetrics and Gynecology

## 2011-11-08 VITALS — BP 112/68 | Ht 69.0 in | Wt 298.0 lb

## 2011-11-08 DIAGNOSIS — Z30431 Encounter for routine checking of intrauterine contraceptive device: Secondary | ICD-10-CM

## 2011-11-08 NOTE — Progress Notes (Signed)
IUD SURVEILLANCE NOTE  MIRENA IUD inserted on 10/01/2011  Pain: none Bleeding: minimal Patient has checked IUD strings: no Other complaints: no  EXAM:  IUD strings visualized: yes              Pelvic exam normal: yes  Next visit with annual exam   Osborn Coho MD 11/08/2011 11:28 AM

## 2012-01-17 MED ORDER — LEVONORGESTREL 20 MCG/24HR IU IUD: 1.0000 | INTRAUTERINE_SYSTEM | Freq: Once | INTRAUTERINE | Status: DC

## 2013-06-03 ENCOUNTER — Encounter (HOSPITAL_COMMUNITY): Payer: Self-pay | Admitting: Emergency Medicine

## 2013-06-03 ENCOUNTER — Emergency Department (HOSPITAL_COMMUNITY)
Admission: EM | Admit: 2013-06-03 | Discharge: 2013-06-03 | Disposition: A | Discharge status: Home / Self Care | Payer: 59 | Source: Home / Self Care | Attending: Family Medicine | Admitting: Family Medicine

## 2013-06-03 DIAGNOSIS — M5412 Radiculopathy, cervical region: Secondary | ICD-10-CM

## 2013-06-03 MED ORDER — CYCLOBENZAPRINE HCL 5 MG PO TABS: 5.0000 mg | ORAL_TABLET | Freq: Every evening | ORAL | Status: DC | PRN

## 2013-06-03 MED ORDER — PREDNISONE 10 MG PO KIT: PACK | ORAL | Status: DC

## 2013-06-03 NOTE — Discharge Instructions (Signed)
Thank you for coming in today. Follow up with Physical Therapy.  If not better follow up with Dr. Tamala Julian at [facility].  Come back or go to the emergency room if you notice new weakness new numbness problems walking or bowel or bladder problems.  Cervical Radiculopathy Cervical radiculopathy happens when a nerve in the neck is pinched or bruised by a slipped (herniated) disk or by arthritic changes in the bones of the cervical spine. This can occur due to an injury or as part of the normal aging process. Pressure on the cervical nerves can cause pain or numbness that runs from your neck all the way down into your arm and fingers. CAUSES  There are many possible causes, including:  Injury.  Muscle tightness in the neck from overuse.  Swollen, painful joints (arthritis).  Breakdown or degeneration in the bones and joints of the spine (spondylosis) due to aging.  Bone spurs that may develop near the cervical nerves. SYMPTOMS  Symptoms include pain, weakness, or numbness in the affected arm and hand. Pain can be severe or irritating. Symptoms may be worse when extending or turning the neck. DIAGNOSIS  Your caregiver will ask about your symptoms and do a physical exam. He or she may test your strength and reflexes. X-rays, CT scans, and MRI scans may be needed in cases of injury or if the symptoms do not go away after a period of time. Electromyography (EMG) or nerve conduction testing may be done to study how your nerves and muscles are working. TREATMENT  Your caregiver may recommend certain exercises to help relieve your symptoms. Cervical radiculopathy can, and often does, get better with time and treatment. If your problems continue, treatment options may include:  Wearing a soft collar for short periods of time.  Physical therapy to strengthen the neck muscles.  Medicines, such as nonsteroidal anti-inflammatory drugs (NSAIDs), oral corticosteroids, or spinal  injections.  Surgery. Different types of surgery may be done depending on the cause of your problems. HOME CARE INSTRUCTIONS   Put ice on the affected area.  Put ice in a plastic bag.  Place a towel between your skin and the bag.  Leave the ice on for 15-20 minutes, 03-04 times a day or as directed by your caregiver.  If ice does not help, you can try using heat. Take a warm shower or bath, or use a hot water bottle as directed by your caregiver.  You may try a gentle neck and shoulder massage.  Use a flat pillow when you sleep.  Only take over-the-counter or prescription medicines for pain, discomfort, or fever as directed by your caregiver.  If physical therapy was prescribed, follow your caregiver's directions.  If a soft collar was prescribed, use it as directed. SEEK IMMEDIATE MEDICAL CARE IF:   Your pain gets much worse and cannot be controlled with medicines.  You have weakness or numbness in your hand, arm, face, or leg.  You have a high fever or a stiff, rigid neck.  You lose bowel or bladder control (incontinence).  You have trouble with walking, balance, or speaking. MAKE SURE YOU:   Understand these instructions.  Will watch your condition.  Will get help right away if you are not doing well or get worse. Document Released: 11/14/2000 Document Revised: 05/14/2011 Document Reviewed: 10/03/2010 [facility] Patient Information 2014 [place], [place].

## 2013-06-03 NOTE — ED Notes (Signed)
C/o back pain States the pain comes and goes States pain started last Monday for two days left and came back on Sunday/Monday  States when she has this pain she has swelling and tingling in her right arm and hand

## 2013-06-03 NOTE — ED Provider Notes (Signed)
Pamela [name] is a 48 y.o. female who presents to Urgent Care today for right neck and trapezius pain. This is been present for the past several days. She notes pain in her neck trapezius radiating to her right hand. This is associated with occasional numb tingly sensations and subjective weakness. The weakness is improving over the past several days. She denies any bowel bladder dysfunction or difficulty walking. She has tried Aleve which has not helped much. She denies any injury history.   Past Medical History  Diagnosis Date  . Migraine   . H/O menorrhagia 05/01/2006  . Hypertension 03/31/2005  . History of ovarian cyst 2007  . Obesity 2007  . DUB (dysfunctional uterine bleeding) 2007  . Asthma   . Elevated cholesterol   . Weight loss 07/11/2006  . Increased BMI 07/11/2006  . N&V (nausea and vomiting)    History  Substance Use Topics  . Smoking status: Never Smoker   . Smokeless tobacco: Never Used  . Alcohol Use: No   ROS as above Medications: No current facility-administered medications for this encounter.   Current Outpatient Prescriptions  Medication Sig Dispense Refill  . amLODipine-olmesartan (AZOR) 5-40 MG per tablet Take 1 tablet by mouth daily.        . cyclobenzaprine (FLEXERIL) 5 MG tablet Take 1 tablet (5 mg total) by mouth at bedtime as needed for muscle spasms.  20 tablet  0  . ergocalciferol (VITAMIN D2) 50000 UNITS capsule Take 50,000 Units by mouth once a week.      Marland Kitchen levonorgestrel (MIRENA) 20 MCG/24HR IUD 1 Intra Uterine Device (1 each total) by Intrauterine route once.  1 each  0  . levonorgestrel (MIRENA) 20 MCG/24HR IUD 1 each by Intrauterine route once.      . Liraglutide (VICTOZA [place]) Inject 1.6 mg into the skin daily.       . Phentermine-Topiramate (QSYMIA) 3.75-23 MG CP24 Take by mouth.      . PredniSONE 10 MG KIT 12 day dose pack po  1 kit  0  . Pyridoxine HCl (VITAMIN B-6) 25 MG tablet Take 25 mg by mouth 2 (two) times a week.        . rosuvastatin  (CRESTOR) 40 MG tablet Take 40 mg by mouth daily.        Marland Kitchen topiramate (TOPAMAX) 25 MG capsule Take 25 mg by mouth as needed.        Exam:  BP 152/94  Pulse 61  Temp(Src) 98.3 F (36.8 C) (Oral)  Resp 20  SpO2 99%  LMP 05/02/2013 Gen: Well NAD obese HEENT: EOMI,  MMM Lungs: Normal work of breathing. CTABL Heart: RRR no MRG Abd: NABS, Soft. NT, ND Exts: Brisk capillary refill, warm and well perfused.  Neck: Nontender to spinal midline. Normal neck range of motion. Negative Spurling's test. Upper extremity strength is intact throughout. Reflexes are equal and normal bilateral upper extremities. Sensation is intact throughout. Capillary refill intact distally bilateral upper extremity  No results found for this or any previous visit (from the past 24 hour(s)). No results found.  Assessment and Plan: 48 y.o. female with cervical radiculopathy. Plan to treat with prednisone Dosepak, Flexeril. Additionally will use physical therapy. Follow up with sports medicine if not improving  Discussed warning signs or symptoms. Please see discharge instructions. Patient expresses understanding.    Gregor Hams, MD 06/03/13 [phone]

## 2013-07-21 ENCOUNTER — Other Ambulatory Visit: Payer: Self-pay

## 2013-07-21 DIAGNOSIS — R0683 Snoring: Secondary | ICD-10-CM | POA: Insufficient documentation

## 2013-07-21 DIAGNOSIS — I10 Essential (primary) hypertension: Secondary | ICD-10-CM | POA: Insufficient documentation

## 2013-07-21 DIAGNOSIS — E7800 Pure hypercholesterolemia, unspecified: Secondary | ICD-10-CM | POA: Insufficient documentation

## 2013-07-21 DIAGNOSIS — Z1231 Encounter for screening mammogram for malignant neoplasm of breast: Secondary | ICD-10-CM

## 2013-07-29 ENCOUNTER — Ambulatory Visit
Admission: RE | Admit: 2013-07-29 | Discharge: 2013-07-29 | Disposition: A | Discharge status: Home / Self Care | Payer: 59 | Source: Ambulatory Visit

## 2013-07-29 ENCOUNTER — Encounter (INDEPENDENT_AMBULATORY_CARE_PROVIDER_SITE_OTHER): Payer: Self-pay

## 2013-07-29 DIAGNOSIS — Z1231 Encounter for screening mammogram for malignant neoplasm of breast: Secondary | ICD-10-CM

## 2013-08-07 DIAGNOSIS — F54 Psychological and behavioral factors associated with disorders or diseases classified elsewhere: Secondary | ICD-10-CM | POA: Insufficient documentation

## 2013-10-27 DIAGNOSIS — G4733 Obstructive sleep apnea (adult) (pediatric): Secondary | ICD-10-CM | POA: Insufficient documentation

## 2013-10-27 DIAGNOSIS — G4726 Circadian rhythm sleep disorder, shift work type: Secondary | ICD-10-CM | POA: Insufficient documentation

## 2013-10-27 HISTORY — DX: Obstructive sleep apnea (adult) (pediatric): G47.33

## 2014-01-04 ENCOUNTER — Encounter (HOSPITAL_COMMUNITY): Payer: Self-pay | Admitting: Emergency Medicine

## 2015-04-10 ENCOUNTER — Encounter (HOSPITAL_COMMUNITY): Payer: Self-pay | Admitting: Emergency Medicine

## 2015-04-10 ENCOUNTER — Emergency Department (HOSPITAL_COMMUNITY)
Admission: EM | Admit: 2015-04-10 | Discharge: 2015-04-10 | Disposition: A | Discharge status: Home / Self Care | Payer: 59 | Source: Home / Self Care

## 2015-04-10 DIAGNOSIS — R6889 Other general symptoms and signs: Secondary | ICD-10-CM

## 2015-04-10 NOTE — ED Notes (Signed)
Pt d/c by Linde Gillis , PA

## 2015-04-10 NOTE — Discharge Instructions (Signed)
Influenza, Adult Influenza (flu) is an infection in the mouth, nose, and throat (respiratory tract) caused by a virus. The flu can make you feel very ill. Influenza spreads easily from person to person (contagious).  HOME CARE   Only take medicines as told by your doctor.  Use a cool mist humidifier to make breathing easier.  Get plenty of rest until your fever goes away. This usually takes 3 to 4 days.  Drink enough fluids to keep your pee (urine) clear or pale yellow.  Cover your mouth and nose when you cough or sneeze.  Wash your hands well to avoid spreading the flu.  Stay home from work or school until your fever has been gone for at least 1 full day.  Get a flu shot every year. GET HELP RIGHT AWAY IF:   You have trouble breathing or feel short of breath.  Your skin or nails turn blue.  You have severe neck pain or stiffness.  You have a severe headache, facial pain, or earache.  Your fever gets worse or keeps coming back.  You feel sick to your stomach (nauseous), throw up (vomit), or have watery poop (diarrhea).  You have chest pain.  You have a deep cough that gets worse, or you cough up more thick spit (mucus). MAKE SURE YOU:   Understand these instructions.  Will watch your condition.  Will get help right away if you are not doing well or get worse.   This information is not intended to replace advice given to you by your health care provider. Make sure you discuss any questions you have with your health care provider.   Document Released: 11/29/2007 Document Revised: 03/12/2014 Document Reviewed: 05/21/2011 Elsevier Interactive Patient Education 2016 [employer]  

## 2015-04-10 NOTE — ED Notes (Signed)
C/o cold sx onset 6 days associated w/ST, BA, HA, prod cough, emesis, nausea Reports she went to [facility] - was given Doxycycline, and cough meds; treated for Bronchitis and sinus infection States sx are getting worse A&O x4... No acute distress.

## 2015-04-28 ENCOUNTER — Encounter (HOSPITAL_COMMUNITY): Payer: Self-pay | Admitting: Family Medicine

## 2015-04-28 ENCOUNTER — Emergency Department (INDEPENDENT_AMBULATORY_CARE_PROVIDER_SITE_OTHER): Payer: 59

## 2015-04-28 ENCOUNTER — Emergency Department (HOSPITAL_COMMUNITY)
Admission: EM | Admit: 2015-04-28 | Discharge: 2015-04-28 | Disposition: A | Discharge status: Home / Self Care | Payer: 59 | Source: Home / Self Care | Attending: Family Medicine | Admitting: Family Medicine

## 2015-04-28 ENCOUNTER — Emergency Department (HOSPITAL_COMMUNITY): Payer: 59

## 2015-04-28 DIAGNOSIS — S6000XA Contusion of unspecified finger without damage to nail, initial encounter: Secondary | ICD-10-CM

## 2015-04-28 DIAGNOSIS — I10 Essential (primary) hypertension: Secondary | ICD-10-CM

## 2015-04-28 DIAGNOSIS — S62501A Fracture of unspecified phalanx of right thumb, initial encounter for closed fracture: Secondary | ICD-10-CM

## 2015-04-28 DIAGNOSIS — S6010XA Contusion of unspecified finger with damage to nail, initial encounter: Secondary | ICD-10-CM

## 2015-04-28 NOTE — ED Notes (Signed)
Right thumb injury.  Caught right thumb in car door last night.  Visible blood under nail ant base of nail.  Points to this area as location of pain

## 2015-04-28 NOTE — Discharge Instructions (Signed)
You have broken your thumb. Take several weeks to heal. Please use the splint for added protection and support. Please use ibuprofen 6 mg every 6 hours for pain and inflammation. Please let pain be her guide with regards to returning to normal function.

## 2015-04-28 NOTE — ED Provider Notes (Addendum)
CSN: [id number]     Arrival date & time 04/28/15  1315 History   First MD Initiated Contact with Patient 04/28/15 1441     Chief Complaint  Patient presents with  . Finger Injury   (Consider location/radiation/quality/duration/timing/severity/associated sxs/prior Treatment) HPI Right thumb injury. Patient closed her right thumb in the car door one day ago. Car door did not lock the finger was never stuck in the door. Pain and swelling have been constant since injury. Small amount of blood underneath the proximal right thumbnail. Ice and ibuprofen with minimal benefit. Denies any lightheadedness, dizziness, LOC. Sensation in intact. Slight decreased range of motion of the right thumb secondary to swelling.    Past Medical History  Diagnosis Date  . Migraine   . H/O menorrhagia 05/01/2006  . Hypertension 03/31/2005  . History of ovarian cyst 2007  . Obesity 2007  . DUB (dysfunctional uterine bleeding) 2007  . Asthma   . Elevated cholesterol   . Weight loss 07/11/2006  . Increased BMI 07/11/2006  . N&V (nausea and vomiting)    Past Surgical History  Procedure Laterality Date  . Breast reduction surgery  05/1991  . Hysteroscopy  05/01/2006  . Cesarean section     Family History  Problem Relation Age of Onset  . Diabetes Maternal Grandmother   . Hypertension Mother    Social History  Substance Use Topics  . Smoking status: Never Smoker   . Smokeless tobacco: Never Used  . Alcohol Use: No   OB History    Gravida Para Term Preterm AB TAB SAB Ectopic Multiple Living   4 2 1 1 2  1   2      Review of Systems Per HPI with all other pertinent systems negative.   Allergies  Codeine and Shellfish allergy  Home Medications   Prior to Admission medications   Medication Sig Start Date End Date Taking? Authorizing Provider  amLODipine-olmesartan (AZOR) 5-40 MG per tablet Take 1 tablet by mouth daily.      Historical Provider, MD  cyclobenzaprine (FLEXERIL) 5 MG tablet Take 1 tablet  (5 mg total) by mouth at bedtime as needed for muscle spasms. 06/03/13   Gregor Hams, MD  ergocalciferol (VITAMIN D2) 50000 UNITS capsule Take 50,000 Units by mouth once a week.    Historical Provider, MD  levonorgestrel (MIRENA) 20 MCG/24HR IUD 1 Intra Uterine Device (1 each total) by Intrauterine route once. 10/02/11 10/01/12  Everett Graff, MD  levonorgestrel (MIRENA) 20 MCG/24HR IUD 1 each by Intrauterine route once.    Historical Provider, MD  Liraglutide (VICTOZA [place]) Inject 1.6 mg into the skin daily.     Historical Provider, MD  Phentermine-Topiramate (QSYMIA) 3.75-23 MG CP24 Take by mouth.    Historical Provider, MD  PredniSONE 10 MG KIT 12 day dose pack po 06/03/13   Gregor Hams, MD  Pyridoxine HCl (VITAMIN B-6) 25 MG tablet Take 25 mg by mouth 2 (two) times a week.      Historical Provider, MD  rosuvastatin (CRESTOR) 40 MG tablet Take 40 mg by mouth daily.      Historical Provider, MD  topiramate (TOPAMAX) 25 MG capsule Take 25 mg by mouth as needed.    Historical Provider, MD   Meds Ordered and Administered this Visit  Medications - No data to display  BP 185/95 mmHg  Pulse 63  Temp(Src) 98.5 F (36.9 C) (Oral)  Resp 16  SpO2 100% No data found.   Physical Exam Physical Exam  Constitutional: oriented to person, place, and time. appears well-developed and well-nourished. No distress.  HENT:  Head: Normocephalic and atraumatic.  Eyes: EOMI. PERRL.  Neck: Normal range of motion.  Cardiovascular: RRR, no m/r/g, 2+ distal pulses,  Pulmonary/Chest: Effort normal and breath sounds normal. No respiratory distress.  Abdominal: Soft. Bowel sounds are normal. NonTTP, no distension.  Musculoskeletal: Right thumb swollen and tender to palpation especially along the volar surface near the distal portion of the thumb. Small proximal subungual hematoma noted.  Neurological: alert and oriented to person, place, and time.  Skin: Skin is warm. No rash noted. non diaphoretic.  Psychiatric:  normal mood and affect. behavior is normal. Judgment and thought content normal.   ED Course  Procedures (including critical care time)  Labs Review Labs Reviewed - No data to display  Imaging Review Dg Finger Thumb Right  04/28/2015  CLINICAL DATA:  Injury to the right thumb last night. Right thumb pain. EXAM: RIGHT THUMB 2+V COMPARISON:  None. FINDINGS: Alignment of thumb is normal. No evidence for fracture or dislocation. Mild degenerative changes involving the thumb IP joint. IMPRESSION: No acute bone abnormality. Electronically Signed   By: Markus Daft M.D.   On: 04/28/2015 15:05     Visual Acuity Review  Right Eye Distance:   Left Eye Distance:   Bilateral Distance:    Right Eye Near:   Left Eye Near:    Bilateral Near:         MDM   1. Closed fracture of right thumb, initial encounter   2. Subungual hematoma of digit of hand, initial encounter    Elevation in blood pressure likely secondary to pain. Patient to continue home regimen.  Plain film states that there is no obvious fracture though is would appear that there is a small avulsion fracture on the volar surface of the distal phalanx. Patient will be managed with static brace to be used as needed along with ice and anti-inflammatories. If subungual hematoma progresses the blood will need to be evacuated but this is not needed at this time.  I have verbally reviewed the discharge instructions with the patient. A printed AVS was given to the patient.  All questions were answered prior to discharge.    Waldemar Dickens, MD 04/28/15 1539  Waldemar Dickens, MD 04/28/15 [phone]

## 2015-09-05 ENCOUNTER — Other Ambulatory Visit: Payer: Self-pay | Admitting: Nurse Practitioner

## 2015-09-05 DIAGNOSIS — R221 Localized swelling, mass and lump, neck: Secondary | ICD-10-CM

## 2015-09-12 ENCOUNTER — Ambulatory Visit
Admission: RE | Admit: 2015-09-12 | Discharge: 2015-09-12 | Disposition: A | Discharge status: Home / Self Care | Payer: 59 | Source: Ambulatory Visit | Attending: Internal Medicine | Admitting: Internal Medicine

## 2015-09-12 DIAGNOSIS — R221 Localized swelling, mass and lump, neck: Secondary | ICD-10-CM

## 2016-03-20 DIAGNOSIS — I10 Essential (primary) hypertension: Secondary | ICD-10-CM | POA: Diagnosis not present

## 2016-03-20 DIAGNOSIS — E538 Deficiency of other specified B group vitamins: Secondary | ICD-10-CM | POA: Diagnosis not present

## 2016-03-20 DIAGNOSIS — E782 Mixed hyperlipidemia: Secondary | ICD-10-CM | POA: Diagnosis not present

## 2016-03-20 DIAGNOSIS — N951 Menopausal and female climacteric states: Secondary | ICD-10-CM | POA: Diagnosis not present

## 2016-03-23 DIAGNOSIS — E782 Mixed hyperlipidemia: Secondary | ICD-10-CM | POA: Diagnosis not present

## 2016-03-23 DIAGNOSIS — I10 Essential (primary) hypertension: Secondary | ICD-10-CM | POA: Diagnosis not present

## 2016-03-30 DIAGNOSIS — I10 Essential (primary) hypertension: Secondary | ICD-10-CM | POA: Diagnosis not present

## 2016-03-30 DIAGNOSIS — E782 Mixed hyperlipidemia: Secondary | ICD-10-CM | POA: Diagnosis not present

## 2016-03-30 DIAGNOSIS — E538 Deficiency of other specified B group vitamins: Secondary | ICD-10-CM | POA: Diagnosis not present

## 2016-04-04 DIAGNOSIS — E782 Mixed hyperlipidemia: Secondary | ICD-10-CM | POA: Diagnosis not present

## 2016-04-04 DIAGNOSIS — I10 Essential (primary) hypertension: Secondary | ICD-10-CM | POA: Diagnosis not present

## 2016-04-04 DIAGNOSIS — E538 Deficiency of other specified B group vitamins: Secondary | ICD-10-CM | POA: Diagnosis not present

## 2016-04-11 DIAGNOSIS — I10 Essential (primary) hypertension: Secondary | ICD-10-CM | POA: Diagnosis not present

## 2016-04-11 DIAGNOSIS — E538 Deficiency of other specified B group vitamins: Secondary | ICD-10-CM | POA: Diagnosis not present

## 2016-04-11 DIAGNOSIS — E782 Mixed hyperlipidemia: Secondary | ICD-10-CM | POA: Diagnosis not present

## 2016-04-20 DIAGNOSIS — E538 Deficiency of other specified B group vitamins: Secondary | ICD-10-CM | POA: Diagnosis not present

## 2016-04-20 DIAGNOSIS — E782 Mixed hyperlipidemia: Secondary | ICD-10-CM | POA: Diagnosis not present

## 2016-04-20 DIAGNOSIS — I10 Essential (primary) hypertension: Secondary | ICD-10-CM | POA: Diagnosis not present

## 2016-04-27 DIAGNOSIS — E538 Deficiency of other specified B group vitamins: Secondary | ICD-10-CM | POA: Diagnosis not present

## 2016-04-27 DIAGNOSIS — E782 Mixed hyperlipidemia: Secondary | ICD-10-CM | POA: Diagnosis not present

## 2016-04-27 DIAGNOSIS — I10 Essential (primary) hypertension: Secondary | ICD-10-CM | POA: Diagnosis not present

## 2016-05-01 DIAGNOSIS — Z0000 Encounter for general adult medical examination without abnormal findings: Secondary | ICD-10-CM | POA: Diagnosis not present

## 2016-05-01 DIAGNOSIS — I129 Hypertensive chronic kidney disease with stage 1 through stage 4 chronic kidney disease, or unspecified chronic kidney disease: Secondary | ICD-10-CM | POA: Diagnosis not present

## 2016-05-01 DIAGNOSIS — N182 Chronic kidney disease, stage 2 (mild): Secondary | ICD-10-CM | POA: Diagnosis not present

## 2016-05-04 DIAGNOSIS — E538 Deficiency of other specified B group vitamins: Secondary | ICD-10-CM | POA: Diagnosis not present

## 2016-05-04 DIAGNOSIS — I10 Essential (primary) hypertension: Secondary | ICD-10-CM | POA: Diagnosis not present

## 2016-05-04 DIAGNOSIS — E782 Mixed hyperlipidemia: Secondary | ICD-10-CM | POA: Diagnosis not present

## 2016-05-18 DIAGNOSIS — I10 Essential (primary) hypertension: Secondary | ICD-10-CM | POA: Diagnosis not present

## 2016-05-18 DIAGNOSIS — E782 Mixed hyperlipidemia: Secondary | ICD-10-CM | POA: Diagnosis not present

## 2016-05-18 DIAGNOSIS — E538 Deficiency of other specified B group vitamins: Secondary | ICD-10-CM | POA: Diagnosis not present

## 2016-05-25 DIAGNOSIS — E782 Mixed hyperlipidemia: Secondary | ICD-10-CM | POA: Diagnosis not present

## 2016-05-25 DIAGNOSIS — E538 Deficiency of other specified B group vitamins: Secondary | ICD-10-CM | POA: Diagnosis not present

## 2016-05-25 DIAGNOSIS — I10 Essential (primary) hypertension: Secondary | ICD-10-CM | POA: Diagnosis not present

## 2016-06-01 DIAGNOSIS — E782 Mixed hyperlipidemia: Secondary | ICD-10-CM | POA: Diagnosis not present

## 2016-06-01 DIAGNOSIS — E538 Deficiency of other specified B group vitamins: Secondary | ICD-10-CM | POA: Diagnosis not present

## 2016-06-15 DIAGNOSIS — E782 Mixed hyperlipidemia: Secondary | ICD-10-CM | POA: Diagnosis not present

## 2016-06-15 DIAGNOSIS — I10 Essential (primary) hypertension: Secondary | ICD-10-CM | POA: Diagnosis not present

## 2016-06-15 DIAGNOSIS — E538 Deficiency of other specified B group vitamins: Secondary | ICD-10-CM | POA: Diagnosis not present

## 2016-06-28 DIAGNOSIS — H6001 Abscess of right external ear: Secondary | ICD-10-CM | POA: Diagnosis not present

## 2016-07-19 DIAGNOSIS — I10 Essential (primary) hypertension: Secondary | ICD-10-CM | POA: Diagnosis not present

## 2016-07-19 DIAGNOSIS — E782 Mixed hyperlipidemia: Secondary | ICD-10-CM | POA: Diagnosis not present

## 2016-08-03 DIAGNOSIS — E782 Mixed hyperlipidemia: Secondary | ICD-10-CM | POA: Diagnosis not present

## 2016-08-03 DIAGNOSIS — I10 Essential (primary) hypertension: Secondary | ICD-10-CM | POA: Diagnosis not present

## 2016-10-10 DIAGNOSIS — I10 Essential (primary) hypertension: Secondary | ICD-10-CM | POA: Diagnosis not present

## 2016-10-15 DIAGNOSIS — R7309 Other abnormal glucose: Secondary | ICD-10-CM | POA: Diagnosis not present

## 2016-10-15 DIAGNOSIS — N182 Chronic kidney disease, stage 2 (mild): Secondary | ICD-10-CM | POA: Diagnosis not present

## 2016-10-15 DIAGNOSIS — I129 Hypertensive chronic kidney disease with stage 1 through stage 4 chronic kidney disease, or unspecified chronic kidney disease: Secondary | ICD-10-CM | POA: Diagnosis not present

## 2016-10-15 DIAGNOSIS — E785 Hyperlipidemia, unspecified: Secondary | ICD-10-CM | POA: Diagnosis not present

## 2016-10-15 DIAGNOSIS — R5383 Other fatigue: Secondary | ICD-10-CM | POA: Diagnosis not present

## 2016-10-17 DIAGNOSIS — R7303 Prediabetes: Secondary | ICD-10-CM | POA: Diagnosis not present

## 2016-10-17 DIAGNOSIS — I10 Essential (primary) hypertension: Secondary | ICD-10-CM | POA: Diagnosis not present

## 2016-10-24 DIAGNOSIS — Z124 Encounter for screening for malignant neoplasm of cervix: Secondary | ICD-10-CM | POA: Diagnosis not present

## 2016-10-24 DIAGNOSIS — I10 Essential (primary) hypertension: Secondary | ICD-10-CM | POA: Diagnosis not present

## 2016-10-24 DIAGNOSIS — Z01419 Encounter for gynecological examination (general) (routine) without abnormal findings: Secondary | ICD-10-CM | POA: Diagnosis not present

## 2017-04-15 DIAGNOSIS — R209 Unspecified disturbances of skin sensation: Secondary | ICD-10-CM | POA: Diagnosis not present

## 2017-04-15 DIAGNOSIS — I129 Hypertensive chronic kidney disease with stage 1 through stage 4 chronic kidney disease, or unspecified chronic kidney disease: Secondary | ICD-10-CM | POA: Diagnosis not present

## 2017-04-15 DIAGNOSIS — M2550 Pain in unspecified joint: Secondary | ICD-10-CM | POA: Diagnosis not present

## 2017-05-07 DIAGNOSIS — Z0000 Encounter for general adult medical examination without abnormal findings: Secondary | ICD-10-CM | POA: Diagnosis not present

## 2017-05-07 DIAGNOSIS — M79641 Pain in right hand: Secondary | ICD-10-CM | POA: Diagnosis not present

## 2017-05-07 DIAGNOSIS — R9431 Abnormal electrocardiogram [ECG] [EKG]: Secondary | ICD-10-CM | POA: Diagnosis not present

## 2017-05-07 DIAGNOSIS — I10 Essential (primary) hypertension: Secondary | ICD-10-CM | POA: Diagnosis not present

## 2017-06-19 DIAGNOSIS — I10 Essential (primary) hypertension: Secondary | ICD-10-CM | POA: Diagnosis not present

## 2017-06-19 DIAGNOSIS — G43909 Migraine, unspecified, not intractable, without status migrainosus: Secondary | ICD-10-CM | POA: Diagnosis not present

## 2017-06-19 DIAGNOSIS — J309 Allergic rhinitis, unspecified: Secondary | ICD-10-CM | POA: Diagnosis not present

## 2017-06-25 DIAGNOSIS — S63501A Unspecified sprain of right wrist, initial encounter: Secondary | ICD-10-CM | POA: Diagnosis not present

## 2017-06-25 DIAGNOSIS — S8001XA Contusion of right knee, initial encounter: Secondary | ICD-10-CM | POA: Diagnosis not present

## 2017-06-25 DIAGNOSIS — S138XXA Sprain of joints and ligaments of other parts of neck, initial encounter: Secondary | ICD-10-CM | POA: Diagnosis not present

## 2017-07-02 DIAGNOSIS — M542 Cervicalgia: Secondary | ICD-10-CM | POA: Diagnosis not present

## 2017-07-02 DIAGNOSIS — M545 Low back pain: Secondary | ICD-10-CM | POA: Diagnosis not present

## 2017-07-09 DIAGNOSIS — S335XXA Sprain of ligaments of lumbar spine, initial encounter: Secondary | ICD-10-CM | POA: Diagnosis not present

## 2017-07-09 DIAGNOSIS — S134XXA Sprain of ligaments of cervical spine, initial encounter: Secondary | ICD-10-CM | POA: Diagnosis not present

## 2017-07-09 DIAGNOSIS — S8391XA Sprain of unspecified site of right knee, initial encounter: Secondary | ICD-10-CM | POA: Diagnosis not present

## 2017-07-16 DIAGNOSIS — S8391XA Sprain of unspecified site of right knee, initial encounter: Secondary | ICD-10-CM | POA: Diagnosis not present

## 2017-07-16 DIAGNOSIS — S335XXA Sprain of ligaments of lumbar spine, initial encounter: Secondary | ICD-10-CM | POA: Diagnosis not present

## 2017-07-16 DIAGNOSIS — S134XXA Sprain of ligaments of cervical spine, initial encounter: Secondary | ICD-10-CM | POA: Diagnosis not present

## 2017-07-17 ENCOUNTER — Telehealth: Payer: Self-pay | Admitting: Cardiovascular Disease

## 2017-07-17 ENCOUNTER — Encounter: Payer: Self-pay | Admitting: Cardiovascular Disease

## 2017-07-17 DIAGNOSIS — I10 Essential (primary) hypertension: Secondary | ICD-10-CM | POA: Diagnosis not present

## 2017-07-17 DIAGNOSIS — M79641 Pain in right hand: Secondary | ICD-10-CM | POA: Diagnosis not present

## 2017-07-17 DIAGNOSIS — S134XXA Sprain of ligaments of cervical spine, initial encounter: Secondary | ICD-10-CM | POA: Diagnosis not present

## 2017-07-17 DIAGNOSIS — M542 Cervicalgia: Secondary | ICD-10-CM | POA: Diagnosis not present

## 2017-07-17 DIAGNOSIS — S8391XA Sprain of unspecified site of right knee, initial encounter: Secondary | ICD-10-CM | POA: Diagnosis not present

## 2017-07-17 DIAGNOSIS — S335XXA Sprain of ligaments of lumbar spine, initial encounter: Secondary | ICD-10-CM | POA: Diagnosis not present

## 2017-07-17 DIAGNOSIS — Z1389 Encounter for screening for other disorder: Secondary | ICD-10-CM | POA: Diagnosis not present

## 2017-07-17 NOTE — Telephone Encounter (Signed)
NOTES FAXED TO [facility] FROM DR. ROBYN SANDERS [phone].

## 2017-07-19 ENCOUNTER — Encounter: Payer: Self-pay | Admitting: Cardiovascular Disease

## 2017-07-19 ENCOUNTER — Encounter: Payer: 59 | Admitting: Cardiovascular Disease

## 2017-07-23 DIAGNOSIS — S335XXA Sprain of ligaments of lumbar spine, initial encounter: Secondary | ICD-10-CM | POA: Diagnosis not present

## 2017-07-23 DIAGNOSIS — S8391XA Sprain of unspecified site of right knee, initial encounter: Secondary | ICD-10-CM | POA: Diagnosis not present

## 2017-07-23 DIAGNOSIS — S134XXA Sprain of ligaments of cervical spine, initial encounter: Secondary | ICD-10-CM | POA: Diagnosis not present

## 2017-07-24 DIAGNOSIS — S134XXA Sprain of ligaments of cervical spine, initial encounter: Secondary | ICD-10-CM | POA: Diagnosis not present

## 2017-07-24 DIAGNOSIS — S335XXA Sprain of ligaments of lumbar spine, initial encounter: Secondary | ICD-10-CM | POA: Diagnosis not present

## 2017-07-24 DIAGNOSIS — S8391XA Sprain of unspecified site of right knee, initial encounter: Secondary | ICD-10-CM | POA: Diagnosis not present

## 2017-07-31 DIAGNOSIS — S134XXA Sprain of ligaments of cervical spine, initial encounter: Secondary | ICD-10-CM | POA: Diagnosis not present

## 2017-07-31 DIAGNOSIS — S8391XA Sprain of unspecified site of right knee, initial encounter: Secondary | ICD-10-CM | POA: Diagnosis not present

## 2017-07-31 DIAGNOSIS — S335XXA Sprain of ligaments of lumbar spine, initial encounter: Secondary | ICD-10-CM | POA: Diagnosis not present

## 2017-08-05 DIAGNOSIS — S335XXA Sprain of ligaments of lumbar spine, initial encounter: Secondary | ICD-10-CM | POA: Diagnosis not present

## 2017-08-05 DIAGNOSIS — S134XXA Sprain of ligaments of cervical spine, initial encounter: Secondary | ICD-10-CM | POA: Diagnosis not present

## 2017-08-05 DIAGNOSIS — S8391XA Sprain of unspecified site of right knee, initial encounter: Secondary | ICD-10-CM | POA: Diagnosis not present

## 2017-08-06 NOTE — Progress Notes (Signed)
This encounter was created in error - please disregard.

## 2017-08-09 DIAGNOSIS — S134XXA Sprain of ligaments of cervical spine, initial encounter: Secondary | ICD-10-CM | POA: Diagnosis not present

## 2017-08-09 DIAGNOSIS — S335XXA Sprain of ligaments of lumbar spine, initial encounter: Secondary | ICD-10-CM | POA: Diagnosis not present

## 2017-08-09 DIAGNOSIS — S8391XA Sprain of unspecified site of right knee, initial encounter: Secondary | ICD-10-CM | POA: Diagnosis not present

## 2017-08-12 DIAGNOSIS — S134XXA Sprain of ligaments of cervical spine, initial encounter: Secondary | ICD-10-CM | POA: Diagnosis not present

## 2017-08-12 DIAGNOSIS — S335XXA Sprain of ligaments of lumbar spine, initial encounter: Secondary | ICD-10-CM | POA: Diagnosis not present

## 2017-08-12 DIAGNOSIS — S8391XA Sprain of unspecified site of right knee, initial encounter: Secondary | ICD-10-CM | POA: Diagnosis not present

## 2017-08-14 DIAGNOSIS — S335XXA Sprain of ligaments of lumbar spine, initial encounter: Secondary | ICD-10-CM | POA: Diagnosis not present

## 2017-08-14 DIAGNOSIS — Z1211 Encounter for screening for malignant neoplasm of colon: Secondary | ICD-10-CM | POA: Diagnosis not present

## 2017-08-14 DIAGNOSIS — S134XXA Sprain of ligaments of cervical spine, initial encounter: Secondary | ICD-10-CM | POA: Diagnosis not present

## 2017-08-14 DIAGNOSIS — S8391XA Sprain of unspecified site of right knee, initial encounter: Secondary | ICD-10-CM | POA: Diagnosis not present

## 2017-08-26 DIAGNOSIS — I10 Essential (primary) hypertension: Secondary | ICD-10-CM | POA: Diagnosis not present

## 2017-08-27 DIAGNOSIS — S335XXA Sprain of ligaments of lumbar spine, initial encounter: Secondary | ICD-10-CM | POA: Diagnosis not present

## 2017-08-27 DIAGNOSIS — S8391XA Sprain of unspecified site of right knee, initial encounter: Secondary | ICD-10-CM | POA: Diagnosis not present

## 2017-08-27 DIAGNOSIS — S134XXA Sprain of ligaments of cervical spine, initial encounter: Secondary | ICD-10-CM | POA: Diagnosis not present

## 2017-08-29 ENCOUNTER — Encounter: Payer: Self-pay | Admitting: Cardiology

## 2017-08-29 ENCOUNTER — Ambulatory Visit: Payer: 59 | Admitting: Cardiology

## 2017-08-29 VITALS — BP 148/78 | HR 93 | Ht 69.0 in | Wt 313.0 lb

## 2017-08-29 DIAGNOSIS — I10 Essential (primary) hypertension: Secondary | ICD-10-CM

## 2017-08-29 DIAGNOSIS — E782 Mixed hyperlipidemia: Secondary | ICD-10-CM

## 2017-08-29 DIAGNOSIS — Z136 Encounter for screening for cardiovascular disorders: Secondary | ICD-10-CM | POA: Diagnosis not present

## 2017-08-29 NOTE — Patient Instructions (Signed)

## 2017-08-29 NOTE — Progress Notes (Addendum)
08/29/2017 Pamela [name]   March 27, 1965  [id number]  Primary Physician [name], MD Primary Cardiologist: New (Dr. Irish Lack, [place])  Reason for Visit/CC: New Pt Evaluation for Abnormal EKG   HPI:  Pamela [name] is a 52 y.o. female with CRF which includes FH of CAD (father w/ fatal MI at age 44 and mother with "small heart attack" at age 29), personal h/o HTN and HLD, who is being seen today as a new pt for abnormal EKG. She was referred by her PCP, Dr. [name], at [facility]. EKG at that office visit showed slight TWI in lateral leads. However repeat EKG obtained in our office today show no TWIs. Normal EKG with normal ST pattern.   Pt denies any cardiac symptoms. She walks for exercise, 3 mile walks 3-4 days a week. She denies exertional CP or dyspnea. She also is able to walk several flights of stairs w/o exertional symptoms.  Her PCP follows her BP and cholesterol. She is on amlodipine and Crestor. Pt does not smoke.   .hcc Current Meds  Medication Sig  . amLODipine (NORVASC) 10 MG tablet Take 1 tablet by mouth daily.  . cyclobenzaprine (FLEXERIL) 5 MG tablet Take 1 tablet (5 mg total) by mouth at bedtime as needed for muscle spasms.  Marland Kitchen levonorgestrel (MIRENA) 20 MCG/24HR IUD 1 each by Intrauterine route once.  . Liraglutide (VICTOZA ) Inject 1.6 mg into the skin daily.   . Phentermine-Topiramate (QSYMIA) 3.75-23 MG CP24 Take by mouth.  . rosuvastatin (CRESTOR) 40 MG tablet Take 40 mg by mouth daily.    Marland Kitchen topiramate (TOPAMAX) 25 MG capsule Take 25 mg by mouth as needed.   Allergies  Allergen Reactions  . Codeine   . Shellfish Allergy    Past Medical History:  Diagnosis Date  . Asthma   . DUB (dysfunctional uterine bleeding) 2007  . Elevated cholesterol   . H/O menorrhagia 05/01/2006  . History of ovarian cyst 2007  . Hypertension 03/31/2005  . Increased BMI 07/11/2006  . Migraine   . N&V (nausea and vomiting)   . Obesity 2007  .  Weight loss 07/11/2006   Family History  Problem Relation Age of Onset  . Diabetes Maternal Grandmother   . Hypertension Mother    Past Surgical History:  Procedure Laterality Date  . BREAST REDUCTION SURGERY  05/1991  . CESAREAN SECTION    . HYSTEROSCOPY  05/01/2006   Social History   Socioeconomic History  . Marital status: Single    Spouse name: Not on file  . Number of children: Not on file  . Years of education: Not on file  . Highest education level: Not on file  Occupational History  . Not on file  Social Needs  . Financial resource strain: Not on file  . Food insecurity:    Worry: Not on file    Inability: Not on file  . Transportation needs:    Medical: Not on file    Non-medical: Not on file  Tobacco Use  . Smoking status: Never Smoker  . Smokeless tobacco: Never Used  Substance and Sexual Activity  . Alcohol use: No  . Drug use: No    Frequency: 2.0 times per week  . Sexual activity: Never    Birth control/protection: IUD    Comment: Mirena  Lifestyle  . Physical activity:    Days per week: Not on file    Minutes per session: Not on file  . Stress: Not  on file  Relationships  . Social connections:    Talks on phone: Not on file    Gets together: Not on file    Attends religious service: Not on file    Active member of club or organization: Not on file    Attends meetings of clubs or organizations: Not on file    Relationship status: Not on file  . Intimate partner violence:    Fear of current or ex partner: Not on file    Emotionally abused: Not on file    Physically abused: Not on file    Forced sexual activity: Not on file  Other Topics Concern  . Not on file  Social History Narrative  . Not on file     Review of Systems: General: negative for chills, fever, night sweats or weight changes.  Cardiovascular: negative for chest pain, dyspnea on exertion, edema, orthopnea, palpitations, paroxysmal nocturnal dyspnea or shortness of  breath Dermatological: negative for rash Respiratory: negative for cough or wheezing Urologic: negative for hematuria Abdominal: negative for nausea, vomiting, diarrhea, bright red blood per rectum, melena, or hematemesis Neurologic: negative for visual changes, syncope, or dizziness All other systems reviewed and are otherwise negative except as noted above.   Physical Exam:  Blood pressure (!) 148/78, pulse 93, height 5\' 9"  (1.753 m), weight (!) 313 lb (142 kg), SpO2 97 %.  General appearance: alert, cooperative, no distress and morbidly obese Neck: no carotid bruit and no JVD Lungs: clear to auscultation bilaterally Heart: regular rate and rhythm, S1, S2 normal, no murmur, click, rub or gallop Extremities: extremities normal, atraumatic, no cyanosis or edema Pulses: 2+ and symmetric Skin: Skin color, texture, turgor normal. No rashes or lesions Neurologic: Grossly normal  EKG NSR no TW abnormalities -- personally reviewed   ASSESSMENT AND PLAN:   1. Abnormal EKG: EKG at PCP office showed slight lateral TWIs. However repeat EKG today shows normalization of Twaves. NSR. Despite her cardiac risk factors, she has no cardiac symptoms. No exertional CP or dyspnea. ? If Twave abnormality noted on prior EKG was related to possible elevated BP. Given her lack of symptoms and normal appearing EKG today, we do no recommend any additional cardiac w/u at this time. We recommend continued management of cardiac risk factors for risk reduction. Her PCP can continue to monitor and treat her HTN and HLD. She was instructed to f/u with [place] if she develops any exertional CP or dyspnea. Pt was also seen and examined by Dr. Irish Lack, (Doctor of the Day) who agrees with assessment and plan.    Follow-Up PRN.   Brittainy Ladoris Gene, MHS [facility] 08/29/2017 3:00 PM   Pamela [name] is a 52 y.o. female who is being seen today for the evaluation of screening for cardiovascular condition based on  ECG at the request of [name], MD.    I have examined the patient and reviewed assessment and plan and discussed with patient.  Agree with above as stated.  Cardiac exam is benign.  I personally reviewed the old ECG and today's ECG.  The T waves have normalized.  SHe has no cardiac sx.  I would focus on RF modification including weight loss, lipid lowering therapy, healthy diet and regular exercise.  Patient is in agreement and will let us know if she develops any sx.  We could reconsider testing at that time if needed.   Larae Grooms

## 2017-08-29 NOTE — Addendum Note (Signed)
Addended by: Jettie Booze on: 08/29/2017 05:07 PM   Modules accepted: Level of Service

## 2017-09-02 DIAGNOSIS — I10 Essential (primary) hypertension: Secondary | ICD-10-CM | POA: Diagnosis not present

## 2017-09-02 NOTE — Addendum Note (Signed)
Addended by: Rose Phi on: 09/02/2017 09:19 AM   Modules accepted: Orders

## 2017-09-12 DIAGNOSIS — K635 Polyp of colon: Secondary | ICD-10-CM | POA: Diagnosis not present

## 2017-09-12 DIAGNOSIS — D122 Benign neoplasm of ascending colon: Secondary | ICD-10-CM | POA: Diagnosis not present

## 2017-09-12 DIAGNOSIS — Z1211 Encounter for screening for malignant neoplasm of colon: Secondary | ICD-10-CM | POA: Diagnosis not present

## 2017-09-13 DIAGNOSIS — N951 Menopausal and female climacteric states: Secondary | ICD-10-CM | POA: Diagnosis not present

## 2017-09-13 DIAGNOSIS — E782 Mixed hyperlipidemia: Secondary | ICD-10-CM | POA: Diagnosis not present

## 2017-09-13 DIAGNOSIS — R7303 Prediabetes: Secondary | ICD-10-CM | POA: Diagnosis not present

## 2017-09-18 DIAGNOSIS — E782 Mixed hyperlipidemia: Secondary | ICD-10-CM | POA: Diagnosis not present

## 2017-09-18 DIAGNOSIS — R7303 Prediabetes: Secondary | ICD-10-CM | POA: Diagnosis not present

## 2017-09-24 DIAGNOSIS — I10 Essential (primary) hypertension: Secondary | ICD-10-CM | POA: Diagnosis not present

## 2017-09-24 DIAGNOSIS — R7303 Prediabetes: Secondary | ICD-10-CM | POA: Diagnosis not present

## 2017-10-01 DIAGNOSIS — R7303 Prediabetes: Secondary | ICD-10-CM | POA: Diagnosis not present

## 2017-10-01 DIAGNOSIS — I10 Essential (primary) hypertension: Secondary | ICD-10-CM | POA: Diagnosis not present

## 2017-10-30 DIAGNOSIS — E782 Mixed hyperlipidemia: Secondary | ICD-10-CM | POA: Diagnosis not present

## 2017-11-13 DIAGNOSIS — N951 Menopausal and female climacteric states: Secondary | ICD-10-CM | POA: Diagnosis not present

## 2017-11-13 DIAGNOSIS — I10 Essential (primary) hypertension: Secondary | ICD-10-CM | POA: Diagnosis not present

## 2017-11-13 DIAGNOSIS — E782 Mixed hyperlipidemia: Secondary | ICD-10-CM | POA: Diagnosis not present

## 2017-11-14 DIAGNOSIS — Z01419 Encounter for gynecological examination (general) (routine) without abnormal findings: Secondary | ICD-10-CM | POA: Diagnosis not present

## 2017-11-14 DIAGNOSIS — Z1231 Encounter for screening mammogram for malignant neoplasm of breast: Secondary | ICD-10-CM | POA: Diagnosis not present

## 2017-11-14 DIAGNOSIS — Z124 Encounter for screening for malignant neoplasm of cervix: Secondary | ICD-10-CM | POA: Diagnosis not present

## 2017-11-14 LAB — HM MAMMOGRAPHY

## 2018-01-20 ENCOUNTER — Ambulatory Visit (INDEPENDENT_AMBULATORY_CARE_PROVIDER_SITE_OTHER): Payer: 59 | Admitting: Family Medicine

## 2018-02-03 ENCOUNTER — Ambulatory Visit (INDEPENDENT_AMBULATORY_CARE_PROVIDER_SITE_OTHER): Payer: Self-pay

## 2018-02-03 ENCOUNTER — Ambulatory Visit (INDEPENDENT_AMBULATORY_CARE_PROVIDER_SITE_OTHER): Payer: 59 | Admitting: Orthopaedic Surgery

## 2018-02-03 ENCOUNTER — Encounter (INDEPENDENT_AMBULATORY_CARE_PROVIDER_SITE_OTHER): Payer: Self-pay | Admitting: Orthopaedic Surgery

## 2018-02-03 DIAGNOSIS — M7061 Trochanteric bursitis, right hip: Secondary | ICD-10-CM | POA: Diagnosis not present

## 2018-02-03 DIAGNOSIS — G8929 Other chronic pain: Secondary | ICD-10-CM

## 2018-02-03 DIAGNOSIS — M25561 Pain in right knee: Secondary | ICD-10-CM

## 2018-02-03 DIAGNOSIS — M25562 Pain in left knee: Secondary | ICD-10-CM

## 2018-02-03 DIAGNOSIS — M25551 Pain in right hip: Secondary | ICD-10-CM | POA: Diagnosis not present

## 2018-02-03 MED ORDER — DICLOFENAC SODIUM 1 % TD GEL: 2.0000 g | Freq: Four times a day (QID) | TRANSDERMAL | 3 refills | Status: DC

## 2018-02-03 MED ORDER — METHYLPREDNISOLONE ACETATE 40 MG/ML IJ SUSP
40.0000 mg | INTRAMUSCULAR | Status: AC | PRN
  Administered 2018-02-03: 40 mg via INTRA_ARTICULAR

## 2018-02-03 MED ORDER — LIDOCAINE HCL 1 % IJ SOLN
3.0000 mL | INTRAMUSCULAR | Status: AC | PRN
  Administered 2018-02-03: 3 mL

## 2018-02-03 NOTE — Progress Notes (Signed)
Office Visit Note   Patient: Pamela [name]           Date of Birth: [birth year]           MRN: [id number] Visit Date: 02/03/2018              Requested by: [name], [address] [place], [place] [address] PCP: [name], MD   Assessment & Plan: Visit Diagnoses:  1. Chronic pain of left knee   2. Chronic pain of right knee   3. Pain in right hip   4. Severe obesity (BMI >= 40) (HCC)   5. Trochanteric bursitis, right hip     Plan: She does have severe arthritis in both her knees and I think that contributes to her right hip hurting her.  She is not interested in any type of injection in her knees today but would like a steroid injection of the trochanteric area.  I will also have her try a regular regimen of Aleve and Tylenol arthritis.  I am also going to send in some Voltaren gel for her.  She is going to continue to work on weight loss and quad strengthening exercises.  We had a long and thorough discussion about her knees and about her health in general.  She is a very pleasant individual and at some point will likely need knee replacement surgery.  She has been to continue to try conservative treatment measures and I told her that anytime she would like to have steroid injections followed by hyaluronic acid injections in her knees not to hesitate he is to call.  All questions and concerns were answered and addressed.  Follow-up otherwise be as needed.  Follow-Up Instructions: Return if symptoms worsen or fail to improve.   Orders:  Orders Placed This Encounter  Procedures  . Large Joint Inj  . XR HIP UNILAT W OR W/O PELVIS 1V RIGHT  . XR Knee 1-2 Views Left  . XR Knee 1-2 Views Right   Meds ordered this encounter  Medications  . diclofenac sodium (VOLTAREN) 1 % GEL    Sig: Apply 2 g topically 4 (four) times daily.    Dispense:  100 g    Refill:  3      Procedures: Large Joint Inj: R greater trochanter on 02/03/2018 11:10 AM Indications:  pain and diagnostic evaluation Details: 22 G 1.5 in needle, lateral approach  Arthrogram: No  Medications: 3 mL lidocaine 1 %; 40 mg methylPREDNISolone acetate 40 MG/ML Outcome: tolerated well, no immediate complications Procedure, treatment alternatives, risks and benefits explained, specific risks discussed. Consent was given by the patient. Immediately prior to procedure a time out was called to verify the correct patient, procedure, equipment, support staff and site/side marked as required. Patient was prepped and draped in the usual sterile fashion.       Clinical Data: No additional findings.   Subjective: Chief Complaint  Patient presents with  . Right Hip - Pain  The patient is a very pleasant 52 year old female who comes in for evaluation treatment of bilateral knee pain as well as right hip pain.  She is also right-hand dominant and would like me to take a look at her right hand today.  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  She is dealt with her knees hurting for very long period of time.  She has been told  in the past that she had "bone-on-bone wear" and will need knee replacement surgery.  She has lost about 40 pounds and is still working on weight loss.  She notes that that affects her knees quite a bit.  Her last BMI is 46.2.  She does take Aleve on occasion but rarely.  She points lateral aspect of her right hip as source of her pain in the medial aspect of both knees a source of her pain.  Her hand hurts on the lateral aspect of her right hand.  This is a hand she rests against things when she does a lot of writing and typing.  It is to the lateral side of her right hand again that she reports pain with.  There is no numbness and tingling.  She denies any injuries.  She does report that about 3 years ago she had bilateral knee injections with a steroid that did help.  HPI  Review of Systems She currently denies any  headache, chest pain, shortness of breath, fever, chills, nausea, vomiting.  Objective: Vital Signs: There were no vitals taken for this visit.  Physical Exam She is alert and orient x3 and in no acute distress Ortho Exam Examination of her right hand shows pain over the fifth metacarpal at the midshaft area but no deformity to the bone.  She has good grip and pinch strength and when I compress her hand and shake her hand to simulate a handshake that cause a lot of pain the lateral aspect of her right hand.  She is neurovascularly intact in that hand.  Examination of her right hip shows no pain in the groin but pain on the lateral aspect of her hip is significant with internal X rotation and pain to palpation of the trochanteric area.  Examination of both knees show full range of motion.  Both knees feel ligamentously stable with no effusion.  Both knees have significant patellofemoral crepitation with varus malalignment and medial joint line tenderness.  Both knees hurt past 90 degrees of flexion. Specialty Comments:  No specialty comments available.  Imaging: Xr Hip Unilat W Or W/o Pelvis 1v Right  Result Date: 02/03/2018 An AP pelvis and lateral of the right hip shows no acute findings.  The hip joint space is well-maintained.  Xr Knee 1-2 Views Left  Result Date: 02/03/2018 2 views of the left knee show severe tricompartmental arthritic changes with medial joint space narrowing and varus malalignment.  There is significant patellofemoral disease and osteophytes in all 3 compartments.  Xr Knee 1-2 Views Right  Result Date: 02/03/2018 2 views of the right knee shows significant arthritic changes.  There is tricompartmental arthritis with osteophytes in all 3 compartments.  There is varus malalignment with almost complete loss of medial joint space.  There is severe patellofemoral degenerative changes as well.    PMFS History: Patient Active Problem List   Diagnosis Date Noted  .  Severe obesity (BMI >= 40) ([facility]) 02/03/2018  . IUD contraception-inserted 10/01/11 10/01/2011  . Obesity 09/03/2011   Past Medical History:  Diagnosis Date  . Asthma   . DUB (dysfunctional uterine bleeding) 2007  . Elevated cholesterol   . H/O menorrhagia 05/01/2006  . History of ovarian cyst 2007  . Hypertension 03/31/2005  . Increased BMI 07/11/2006  . Migraine   . N&V (nausea and vomiting)   . Obesity 2007  . Weight loss 07/11/2006    Family History  Problem Relation Age of Onset  . Diabetes Maternal  Grandmother   . Hypertension Mother     Past Surgical History:  Procedure Laterality Date  . BREAST REDUCTION SURGERY  05/1991  . CESAREAN SECTION    . HYSTEROSCOPY  05/01/2006   Social History   Occupational History  . Not on file  Tobacco Use  . Smoking status: Never Smoker  . Smokeless tobacco: Never Used  Substance and Sexual Activity  . Alcohol use: No  . Drug use: No    Frequency: 2.0 times per week  . Sexual activity: Never    Birth control/protection: IUD    Comment: Mirena

## 2018-02-05 ENCOUNTER — Telehealth (INDEPENDENT_AMBULATORY_CARE_PROVIDER_SITE_OTHER): Payer: Self-pay | Admitting: Orthopaedic Surgery

## 2018-02-05 ENCOUNTER — Encounter (INDEPENDENT_AMBULATORY_CARE_PROVIDER_SITE_OTHER): Payer: Self-pay

## 2018-02-05 NOTE — Telephone Encounter (Signed)
Patient called needing a note to return back to work without restrictions. Patient asked if the note can be emailed to her. The email address is [email or web address]. The number to contact patient is [phone]

## 2018-02-05 NOTE — Telephone Encounter (Signed)
Emailed to provided email address  

## 2018-02-05 NOTE — Telephone Encounter (Signed)
Should be fine right?

## 2018-02-05 NOTE — Telephone Encounter (Signed)
That will be fine. 

## 2018-02-06 ENCOUNTER — Other Ambulatory Visit: Payer: Self-pay | Admitting: Internal Medicine

## 2018-02-13 ENCOUNTER — Ambulatory Visit (INDEPENDENT_AMBULATORY_CARE_PROVIDER_SITE_OTHER): Payer: 59 | Admitting: Orthopaedic Surgery

## 2018-02-13 ENCOUNTER — Encounter (INDEPENDENT_AMBULATORY_CARE_PROVIDER_SITE_OTHER): Payer: Self-pay | Admitting: Orthopaedic Surgery

## 2018-02-13 DIAGNOSIS — M7061 Trochanteric bursitis, right hip: Secondary | ICD-10-CM

## 2018-02-13 DIAGNOSIS — M25551 Pain in right hip: Secondary | ICD-10-CM | POA: Diagnosis not present

## 2018-02-13 NOTE — Progress Notes (Signed)
The patient is well-known to me.  She is in follow-up after I provided a steroid injection around the trochanteric area of the right hip.  She says it helped for maybe a day or 2 but now the pain is becoming more aggravating again.  She is someone who has a BMI of 46.2.  She is active and on her feet all day long.  She does have chronic knee pain as well.  I can put her hip to internal extra rotation of the right side with no pain in the groin at all and her x-rays do not show any acute findings from when we x-rayed her hips a week ago.  This seems to be more of a significant trochanteric bursitis.  My next step for her would be to send her to outpatient physical therapy.  She is agreeable to try this if she can work it into her schedule.  I gave her a prescription for [facility] physical [facility].  I would like to see her back in about 6 weeks because I would consider repeat injection then since she is not a diabetic.  All question concerns were answered and addressed.

## 2018-03-10 DIAGNOSIS — R7303 Prediabetes: Secondary | ICD-10-CM | POA: Diagnosis not present

## 2018-03-10 DIAGNOSIS — Z6841 Body Mass Index (BMI) 40.0 and over, adult: Secondary | ICD-10-CM | POA: Diagnosis not present

## 2018-03-17 DIAGNOSIS — E782 Mixed hyperlipidemia: Secondary | ICD-10-CM | POA: Diagnosis not present

## 2018-03-27 ENCOUNTER — Ambulatory Visit (INDEPENDENT_AMBULATORY_CARE_PROVIDER_SITE_OTHER): Payer: 59 | Admitting: Orthopaedic Surgery

## 2018-03-27 ENCOUNTER — Encounter (INDEPENDENT_AMBULATORY_CARE_PROVIDER_SITE_OTHER): Payer: Self-pay | Admitting: Orthopaedic Surgery

## 2018-03-27 ENCOUNTER — Other Ambulatory Visit (INDEPENDENT_AMBULATORY_CARE_PROVIDER_SITE_OTHER): Payer: Self-pay

## 2018-03-27 VITALS — Ht 69.0 in | Wt 313.0 lb

## 2018-03-27 DIAGNOSIS — M25531 Pain in right wrist: Secondary | ICD-10-CM | POA: Diagnosis not present

## 2018-03-27 DIAGNOSIS — M1711 Unilateral primary osteoarthritis, right knee: Secondary | ICD-10-CM | POA: Diagnosis not present

## 2018-03-27 DIAGNOSIS — M25551 Pain in right hip: Secondary | ICD-10-CM

## 2018-03-27 MED ORDER — NABUMETONE 750 MG PO TABS: 750.0000 mg | ORAL_TABLET | Freq: Two times a day (BID) | ORAL | 1 refills | Status: DC | PRN

## 2018-03-27 NOTE — Progress Notes (Signed)
Office Visit Note   Patient: Pamela [name]           Date of Birth: October 21, 1965           MRN: [id number] Visit Date: 03/27/2018              Requested by: [name], [address] [place], [place] [address] PCP: [name], MD   Assessment & Plan: Visit Diagnoses:  1. Pain in right hip   2. Unilateral primary osteoarthritis, right knee   3. Pain in right wrist     Plan: I am going to start her on Relafen 750 mg twice daily with food.  We are also going to obtain an MRI of her right hip to assess for any muscle tear and nerve conduction studies of her right upper extremity to rule out carpal tunnel syndrome.  We will try wrist splint as well.  All question concerns were answered and addressed.  We will see her back in a few weeks to go over the studies.  We will also try to order hyaluronic acid for her right knee and I gave her handout for this.  Follow-Up Instructions: Return in about 2 weeks (around 04/10/2018).   Orders:  No orders of the defined types were placed in this encounter.  Meds ordered this encounter  Medications  . nabumetone (RELAFEN) 750 MG tablet    Sig: Take 1 tablet (750 mg total) by mouth 2 (two) times daily as needed.    Dispense:  60 tablet    Refill:  1      Procedures: No procedures performed   Clinical Data: No additional findings.   Subjective: Chief Complaint  Patient presents with  . Right Hip - Follow-up  The patient is well-known to me.  She is a very pleasant 53 year old female who we have seen for right hip pain.  We provided injection of the trochanteric area and this did not help.  More of her pain is in the groin and the proximal right thigh.  She is also now having right arm and hand pain.  She is been having right knee pain.  She has well-documented significant osteoarthritis in the right knee.  She is someone with a body mass index of 46.22.  This is become frustrating for her in terms of pain.  She  alternates Aleve and Tylenol arthritis.  She is very active individual with her work.  She does report some numbness and tingling in her fingertips on the right side.  HPI  Review of Systems She currently denies any headache, chest pain, shortness of breath, fever, chills, nausea, vomiting  Objective: Vital Signs: Ht 5\' 9"  (1.753 m)   Wt (!) 313 lb (142 kg)   BMI 46.22 kg/m   Physical Exam She is alert and orient x3 and in no acute distress Ortho Exam Examination of the right upper extremity shows fluid motion of the wrist elbow and shoulder as well as fingers without difficulty.  She does have pain and some decreased sensation but no glaring deformities.  There is no muscle atrophy.  Examination of her right hip shows that has full internal X rotation with some pain in the groin.  Seems a muscle related more so.  Examination of her right knee shows medial joint line tenderness and varus malalignment.  The knee has good range of motion and is ligamentously stable. Specialty Comments:  No specialty comments available.  Imaging: No results found. X-rays of  her hip from her last visit showed normal-appearing hip.  X-rays of the right knee show severe tricompartment arthritic change especially involving the medial compartment with near bone-on-bone wear.  There is particular osteophytes throughout the knee as well.  PMFS History: Patient Active Problem List   Diagnosis Date Noted  . Severe obesity (BMI >= 40) ([place]) 02/03/2018  . IUD contraception-inserted 10/01/11 10/01/2011  . Obesity 09/03/2011   Past Medical History:  Diagnosis Date  . Asthma   . DUB (dysfunctional uterine bleeding) 2007  . Elevated cholesterol   . H/O menorrhagia 05/01/2006  . History of ovarian cyst 2007  . Hypertension 03/31/2005  . Increased BMI 07/11/2006  . Migraine   . N&V (nausea and vomiting)   . Obesity 2007  . Weight loss 07/11/2006    Family History  Problem Relation Age of Onset  . Diabetes  Maternal Grandmother   . Hypertension Mother     Past Surgical History:  Procedure Laterality Date  . BREAST REDUCTION SURGERY  05/1991  . CESAREAN SECTION    . HYSTEROSCOPY  05/01/2006   Social History   Occupational History  . Not on file  Tobacco Use  . Smoking status: Never Smoker  . Smokeless tobacco: Never Used  Substance and Sexual Activity  . Alcohol use: No  . Drug use: No    Frequency: 2.0 times per week  . Sexual activity: Never    Birth control/protection: I.U.D.    Comment: Mirena

## 2018-03-28 ENCOUNTER — Other Ambulatory Visit (INDEPENDENT_AMBULATORY_CARE_PROVIDER_SITE_OTHER): Payer: Self-pay

## 2018-03-28 ENCOUNTER — Telehealth (INDEPENDENT_AMBULATORY_CARE_PROVIDER_SITE_OTHER): Payer: Self-pay

## 2018-03-28 DIAGNOSIS — M79641 Pain in right hand: Secondary | ICD-10-CM

## 2018-03-28 DIAGNOSIS — M25551 Pain in right hip: Secondary | ICD-10-CM

## 2018-03-28 NOTE — Telephone Encounter (Signed)
Right knee gel injection  

## 2018-04-08 ENCOUNTER — Telehealth (INDEPENDENT_AMBULATORY_CARE_PROVIDER_SITE_OTHER): Payer: Self-pay

## 2018-04-08 NOTE — Telephone Encounter (Signed)
Submitted VOB for Durolane, right knee. 

## 2018-04-08 NOTE — Telephone Encounter (Signed)
Noted  

## 2018-04-10 ENCOUNTER — Ambulatory Visit (INDEPENDENT_AMBULATORY_CARE_PROVIDER_SITE_OTHER): Payer: 59 | Admitting: Orthopaedic Surgery

## 2018-04-11 ENCOUNTER — Telehealth (INDEPENDENT_AMBULATORY_CARE_PROVIDER_SITE_OTHER): Payer: Self-pay

## 2018-04-11 NOTE — Telephone Encounter (Signed)
Patient is approved for Durolane, right knee. Buy & Bill Covered at 100% of the allowable amount. Co-pay of $50.00 No PA required  Appt. 04/22/2018 with Dr. Ninfa Linden

## 2018-04-16 ENCOUNTER — Encounter (INDEPENDENT_AMBULATORY_CARE_PROVIDER_SITE_OTHER): Payer: Self-pay | Admitting: Physical Medicine and Rehabilitation

## 2018-04-16 ENCOUNTER — Telehealth (INDEPENDENT_AMBULATORY_CARE_PROVIDER_SITE_OTHER): Payer: Self-pay | Admitting: Orthopaedic Surgery

## 2018-04-16 NOTE — Telephone Encounter (Signed)
Called in Rx to pharmacy. Tried to leave patient a message but her VM hasn't been setup

## 2018-04-16 NOTE — Telephone Encounter (Signed)
Pt came in the office asking if she can be sent in some medication for her MRI because she is very claustrophobiic.

## 2018-04-18 ENCOUNTER — Other Ambulatory Visit: Payer: 59

## 2018-04-18 ENCOUNTER — Ambulatory Visit
Admission: RE | Admit: 2018-04-18 | Discharge: 2018-04-18 | Disposition: A | Discharge status: Home / Self Care | Payer: 59 | Source: Ambulatory Visit | Attending: Orthopaedic Surgery | Admitting: Orthopaedic Surgery

## 2018-04-18 DIAGNOSIS — M25551 Pain in right hip: Secondary | ICD-10-CM

## 2018-04-21 ENCOUNTER — Other Ambulatory Visit: Payer: 59

## 2018-04-22 ENCOUNTER — Ambulatory Visit (INDEPENDENT_AMBULATORY_CARE_PROVIDER_SITE_OTHER): Payer: Self-pay | Admitting: Orthopaedic Surgery

## 2018-04-24 ENCOUNTER — Ambulatory Visit (INDEPENDENT_AMBULATORY_CARE_PROVIDER_SITE_OTHER): Payer: 59 | Admitting: Physician Assistant

## 2018-04-25 ENCOUNTER — Encounter (INDEPENDENT_AMBULATORY_CARE_PROVIDER_SITE_OTHER): Payer: Self-pay | Admitting: Physical Medicine and Rehabilitation

## 2018-04-25 ENCOUNTER — Ambulatory Visit (INDEPENDENT_AMBULATORY_CARE_PROVIDER_SITE_OTHER): Payer: 59 | Admitting: Physical Medicine and Rehabilitation

## 2018-04-25 DIAGNOSIS — R202 Paresthesia of skin: Secondary | ICD-10-CM

## 2018-04-25 NOTE — Progress Notes (Signed)
 .  Numeric Pain Rating Scale and Functional Assessment Average Pain 7   In the last MONTH (on 0-10 scale) has pain interfered with the following?  1. General activity like being  able to carry out your everyday physical activities such as walking, climbing stairs, carrying groceries, or moving a chair?  Rating(8)     

## 2018-04-28 NOTE — Procedures (Signed)
EMG & NCV Findings: Evaluation of the right median (across palm) sensory nerve showed prolonged distal peak latency (Wrist, 3.9 ms).  All remaining nerves (as indicated in the following tables) were within normal limits.    Needle evaluation of the right abductor pollicis brevis, the right first dorsal interosseous, the right Ext Digitorum, and the right triceps muscles showed moderately increased spontaneous activity.  All remaining muscles (as indicated in the following table) showed no evidence of electrical instability.    Impression: The above electrodiagnostic study is ABNORMAL and reveals evidence of moderate C8 radiculopathy on the right.  There is also evidence of a very mild right median nerve entrapment at the wrist (carpal tunnel syndrome) affecting sensory components.  The very mild median nerve entrapment does not really fit with her symptoms.   There is no significant electrodiagnostic evidence of any other focal nerve entrapment, brachial plexopathy or generalized peripheral neuropathy.   Recommendations: 1.  Follow-up with referring physician. 2.  Continue current management of symptoms. 3.  Continue use of resting splint at night-time and as needed during the day. 4.  Suggest MRI of the cervical spine.  Unfortunately the patient is very claustrophobic and tells me that she had a hard time getting the recent hip MRI do to the amount of mobility and claustrophobia that she has.  She basically stated that she probably would not be able to have an MRI of her cervical spine.  ___________________________ Wonda Olds Board Certified, American Board of Physical Medicine and Rehabilitation    Nerve Conduction Studies Anti Sensory Summary Table   Stim Site NR Peak (ms) Norm Peak (ms) P-T Amp (V) Norm P-T Amp Site1 Site2 Delta-P (ms) Dist (cm) Vel (m/s) Norm Vel (m/s)  Right Median Acr Palm Anti Sensory (2nd Digit)  32.8C  Wrist    *3.9 <3.6 35.9 >10 Wrist Palm 2.1 0.0      Palm    1.8 <2.0 7.7         Right Radial Anti Sensory (Base 1st Digit)  32.6C  Wrist    2.2 <3.1 7.0  Wrist Base 1st Digit 2.2 0.0    Right Ulnar Anti Sensory (5th Digit)  32.7C  Wrist    3.3 <3.7 27.9 >15.0 Wrist 5th Digit 3.3 14.0 42 >38   Motor Summary Table   Stim Site NR Onset (ms) Norm Onset (ms) O-P Amp (mV) Norm O-P Amp Site1 Site2 Delta-0 (ms) Dist (cm) Vel (m/s) Norm Vel (m/s)  Right Median Motor (Abd Poll Brev)  32.4C  Wrist    3.8 <4.2 8.3 >5 Elbow Wrist 4.1 21.0 51 >50  Elbow    7.9  7.9         Right Ulnar Motor (Abd Dig Min)  32.7C  Wrist    3.4 <4.2 6.9 >3 B Elbow Wrist 3.6 21.0 58 >53  B Elbow    7.0  5.3  A Elbow B Elbow 1.8 10.0 56 >53  A Elbow    8.8  5.3          EMG   Side Muscle Nerve Root Ins Act Fibs Psw Amp Dur Poly Recrt Int Fraser Din Comment  Right Abd Poll Brev Median C8-T1 Nml *2+ *2+ Nml Nml 0 Nml Nml   Right 1stDorInt Ulnar C8-T1 Nml *2+ *2+ Nml Nml 0 Nml Nml   Right PronatorTeres Median C6-7 Nml Nml Nml Nml Nml 0 Nml Nml   Right Biceps Musculocut C5-6 Nml Nml Nml Nml Nml 0 Nml Nml  Right Deltoid Axillary C5-6 Nml Nml Nml Nml Nml 0 Nml Nml   Right Ext Digitorum  Radial (Post Int) C7-8 Nml *2+ *2+ Nml Nml 0 Nml Nml   Right Triceps Radial C6-7-8 Nml *2+ *2+ Nml Nml 0 Nml Nml     Nerve Conduction Studies Anti Sensory Left/Right Comparison   Stim Site L Lat (ms) R Lat (ms) L-R Lat (ms) L Amp (V) R Amp (V) L-R Amp (%) Site1 Site2 L Vel (m/s) R Vel (m/s) L-R Vel (m/s)  Median Acr Palm Anti Sensory (2nd Digit)  32.8C  Wrist  *3.9   35.9  Wrist Palm     Palm  1.8   7.7        Radial Anti Sensory (Base 1st Digit)  32.6C  Wrist  2.2   7.0  Wrist Base 1st Digit     Ulnar Anti Sensory (5th Digit)  32.7C  Wrist  3.3   27.9  Wrist 5th Digit  42    Motor Left/Right Comparison   Stim Site L Lat (ms) R Lat (ms) L-R Lat (ms) L Amp (mV) R Amp (mV) L-R Amp (%) Site1 Site2 L Vel (m/s) R Vel (m/s) L-R Vel (m/s)  Median Motor (Abd Poll Brev)  32.4C  Wrist   3.8   8.3  Elbow Wrist  51   Elbow  7.9   7.9        Ulnar Motor (Abd Dig Min)  32.7C  Wrist  3.4   6.9  B Elbow Wrist  58   B Elbow  7.0   5.3  A Elbow B Elbow  56   A Elbow  8.8   5.3           Waveforms:

## 2018-04-30 ENCOUNTER — Ambulatory Visit (INDEPENDENT_AMBULATORY_CARE_PROVIDER_SITE_OTHER): Payer: 59 | Admitting: Physician Assistant

## 2018-05-03 NOTE — Progress Notes (Signed)
Pamela [name] - 53 y.o. female MRN [id number]  Date of birth: November 29, 1965  Office Visit Note: Visit Date: 04/25/2018 PCP: [name], MD Referred by: [name], MD  Subjective: Chief Complaint  Patient presents with  . Right Arm - Pain, Tingling  . Right Hand - Numbness, Tingling   HPI: Pamela [name] is a 53 y.o. female who comes in today At the request of Dr. Jean Rosenthal for electrodiagnostic study of the right upper limb.  She has been experiencing 6 or more months of progressive worsening pain and tingling in the right arm and right hand particularly in the middle ring and fifth digit.  Somewhat nondermatomal may be more ulnar.  She reports symptoms are constant at this point she does get some relief with heating pad.  She reports that she has weakness in the right arm and sometimes it gives out with her.  She is right-hand dominant.  She denies any left-sided symptoms.  She rates her pain as a 7 out of 10.  She also has been having right hip pain and did obtain an MRI of her right hip.  She is following up with Dr. Ninfa Linden for review both studies.  ROS Otherwise per HPI.  Assessment & Plan: Visit Diagnoses:  1. Paresthesia of skin     Plan: Impression: The above electrodiagnostic study is ABNORMAL and reveals evidence of moderate C8 radiculopathy on the right.  There is also evidence of a very mild right median nerve entrapment at the wrist (carpal tunnel syndrome) affecting sensory components.  The very mild median nerve entrapment does not really fit with her symptoms.   There is no significant electrodiagnostic evidence of any other focal nerve entrapment, brachial plexopathy or generalized peripheral neuropathy.   Recommendations: 1.  Follow-up with referring physician. 2.  Continue current management of symptoms. 3.  Continue use of resting splint at night-time and as needed during the day. 4.  Suggest MRI of the cervical spine.  Unfortunately the  patient is very claustrophobic and tells me that she had a hard time getting the recent hip MRI do to the amount of mobility and claustrophobia that she has.  She basically stated that she probably would not be able to have an MRI of her cervical spine.   Meds & Orders: No orders of the defined types were placed in this encounter.   Orders Placed This Encounter  Procedures  . NCV with EMG (electromyography)    Follow-up: Return for Jean Rosenthal, MD.   Procedures: No procedures performed  EMG & NCV Findings: Evaluation of the right median (across palm) sensory nerve showed prolonged distal peak latency (Wrist, 3.9 ms).  All remaining nerves (as indicated in the following tables) were within normal limits.    Needle evaluation of the right abductor pollicis brevis, the right first dorsal interosseous, the right Ext Digitorum, and the right triceps muscles showed moderately increased spontaneous activity.  All remaining muscles (as indicated in the following table) showed no evidence of electrical instability.    Impression: The above electrodiagnostic study is ABNORMAL and reveals evidence of moderate C8 radiculopathy on the right.  There is also evidence of a very mild right median nerve entrapment at the wrist (carpal tunnel syndrome) affecting sensory components.  The very mild median nerve entrapment does not really fit with her symptoms.   There is no significant electrodiagnostic evidence of any other focal nerve entrapment, brachial plexopathy or generalized peripheral neuropathy.   Recommendations: 1.  Follow-up with referring physician. 2.  Continue current management of symptoms. 3.  Continue use of resting splint at night-time and as needed during the day. 4.  Suggest MRI of the cervical spine.  Unfortunately the patient is very claustrophobic and tells me that she had a hard time getting the recent hip MRI do to the amount of mobility and claustrophobia that she has.   She basically stated that she probably would not be able to have an MRI of her cervical spine.  ___________________________ Wonda Olds Board Certified, American Board of Physical Medicine and Rehabilitation    Nerve Conduction Studies Anti Sensory Summary Table   Stim Site NR Peak (ms) Norm Peak (ms) P-T Amp (V) Norm P-T Amp Site1 Site2 Delta-P (ms) Dist (cm) Vel (m/s) Norm Vel (m/s)  Right Median Acr Palm Anti Sensory (2nd Digit)  32.8C  Wrist    *3.9 <3.6 35.9 >10 Wrist Palm 2.1 0.0    Palm    1.8 <2.0 7.7         Right Radial Anti Sensory (Base 1st Digit)  32.6C  Wrist    2.2 <3.1 7.0  Wrist Base 1st Digit 2.2 0.0    Right Ulnar Anti Sensory (5th Digit)  32.7C  Wrist    3.3 <3.7 27.9 >15.0 Wrist 5th Digit 3.3 14.0 42 >38   Motor Summary Table   Stim Site NR Onset (ms) Norm Onset (ms) O-P Amp (mV) Norm O-P Amp Site1 Site2 Delta-0 (ms) Dist (cm) Vel (m/s) Norm Vel (m/s)  Right Median Motor (Abd Poll Brev)  32.4C  Wrist    3.8 <4.2 8.3 >5 Elbow Wrist 4.1 21.0 51 >50  Elbow    7.9  7.9         Right Ulnar Motor (Abd Dig Min)  32.7C  Wrist    3.4 <4.2 6.9 >3 B Elbow Wrist 3.6 21.0 58 >53  B Elbow    7.0  5.3  A Elbow B Elbow 1.8 10.0 56 >53  A Elbow    8.8  5.3          EMG   Side Muscle Nerve Root Ins Act Fibs Psw Amp Dur Poly Recrt Int Fraser Din Comment  Right Abd Poll Brev Median C8-T1 Nml *2+ *2+ Nml Nml 0 Nml Nml   Right 1stDorInt Ulnar C8-T1 Nml *2+ *2+ Nml Nml 0 Nml Nml   Right PronatorTeres Median C6-7 Nml Nml Nml Nml Nml 0 Nml Nml   Right Biceps Musculocut C5-6 Nml Nml Nml Nml Nml 0 Nml Nml   Right Deltoid Axillary C5-6 Nml Nml Nml Nml Nml 0 Nml Nml   Right Ext Digitorum  Radial (Post Int) C7-8 Nml *2+ *2+ Nml Nml 0 Nml Nml   Right Triceps Radial C6-7-8 Nml *2+ *2+ Nml Nml 0 Nml Nml     Nerve Conduction Studies Anti Sensory Left/Right Comparison   Stim Site L Lat (ms) R Lat (ms) L-R Lat (ms) L Amp (V) R Amp (V) L-R Amp (%) Site1 Site2 L Vel (m/s) R Vel  (m/s) L-R Vel (m/s)  Median Acr Palm Anti Sensory (2nd Digit)  32.8C  Wrist  *3.9   35.9  Wrist Palm     Palm  1.8   7.7        Radial Anti Sensory (Base 1st Digit)  32.6C  Wrist  2.2   7.0  Wrist Base 1st Digit     Ulnar Anti Sensory (5th Digit)  32.7C  Wrist  3.3  27.9  Wrist 5th Digit  42    Motor Left/Right Comparison   Stim Site L Lat (ms) R Lat (ms) L-R Lat (ms) L Amp (mV) R Amp (mV) L-R Amp (%) Site1 Site2 L Vel (m/s) R Vel (m/s) L-R Vel (m/s)  Median Motor (Abd Poll Brev)  32.4C  Wrist  3.8   8.3  Elbow Wrist  51   Elbow  7.9   7.9        Ulnar Motor (Abd Dig Min)  32.7C  Wrist  3.4   6.9  B Elbow Wrist  58   B Elbow  7.0   5.3  A Elbow B Elbow  56   A Elbow  8.8   5.3           Waveforms:            Clinical History: No specialty comments available.   She reports that she has never smoked. She has never used smokeless tobacco. No results for input(s): HGBA1C, LABURIC in the last 8760 hours.  Objective:  VS:  HT:    WT:   BMI:     BP:   HR: bpm  TEMP: ( )  RESP:  Physical Exam Musculoskeletal:        General: No swelling, tenderness or deformity.     Comments: Inspection reveals no atrophy of the bilateral APB or FDI or hand intrinsics. There is no swelling, color changes, allodynia or dystrophic changes. There is 5 out of 5 strength in the bilateral wrist extension, finger abduction and long finger flexion.  Decreased sensation or dysesthesia in the right C7 or C8 dermatome.  There is a negative Froment's test bilaterally. There is a negative Tinel's test at the bilateral wrist and elbow. There is a negative Phalen's test bilaterally. There is a negative Hoffmann's test bilaterally.  Skin:    General: Skin is warm and dry.     Findings: No erythema or rash.  Neurological:     General: No focal deficit present.     Mental Status: She is alert and oriented to person, place, and time.     Motor: No weakness or abnormal muscle tone.     Coordination:  Coordination normal.  Psychiatric:        Mood and Affect: Mood normal.        Behavior: Behavior normal.     Ortho Exam Imaging: No results found.  Past Medical/Family/Surgical/Social History: Medications & Allergies reviewed per EMR, new medications updated. Patient Active Problem List   Diagnosis Date Noted  . Severe obesity (BMI >= 40) ([place]) 02/03/2018  . IUD contraception-inserted 10/01/11 10/01/2011  . Obesity 09/03/2011   Past Medical History:  Diagnosis Date  . Asthma   . DUB (dysfunctional uterine bleeding) 2007  . Elevated cholesterol   . H/O menorrhagia 05/01/2006  . History of ovarian cyst 2007  . Hypertension 03/31/2005  . Increased BMI 07/11/2006  . Migraine   . N&V (nausea and vomiting)   . Obesity 2007  . Weight loss 07/11/2006   Family History  Problem Relation Age of Onset  . Diabetes Maternal Grandmother   . Hypertension Mother    Past Surgical History:  Procedure Laterality Date  . BREAST REDUCTION SURGERY  05/1991  . CESAREAN SECTION    . HYSTEROSCOPY  05/01/2006   Social History   Occupational History  . Not on file  Tobacco Use  . Smoking status: Never Smoker  . Smokeless tobacco: Never Used  Substance and Sexual Activity  . Alcohol use: No  . Drug use: No    Frequency: 2.0 times per week  . Sexual activity: Never    Birth control/protection: I.U.D.    Comment: Mirena

## 2018-05-12 ENCOUNTER — Encounter (INDEPENDENT_AMBULATORY_CARE_PROVIDER_SITE_OTHER): Payer: Self-pay | Admitting: Physician Assistant

## 2018-05-12 ENCOUNTER — Ambulatory Visit (INDEPENDENT_AMBULATORY_CARE_PROVIDER_SITE_OTHER): Payer: 59 | Admitting: Physician Assistant

## 2018-05-12 DIAGNOSIS — M1611 Unilateral primary osteoarthritis, right hip: Secondary | ICD-10-CM

## 2018-05-12 DIAGNOSIS — M5412 Radiculopathy, cervical region: Secondary | ICD-10-CM | POA: Diagnosis not present

## 2018-05-12 MED ORDER — DIAZEPAM 5 MG PO TABS: ORAL_TABLET | ORAL | 0 refills | Status: DC

## 2018-05-12 NOTE — Progress Notes (Signed)
Office Visit Note   Patient: Pamela [name]           Date of Birth: 04-21-1965           MRN: [id number] Visit Date: 05/12/2018              Requested by: [name], [address] [place], [place] [address] PCP: [name], MD   Assessment & Plan: Visit Diagnoses:  1. Primary osteoarthritis of right hip   2. Cervical radicular pain     Plan: We will obtain an open MRI of her cervical spine due to her severe claustrophobia to rule out HNP as the source of her radicular pain down the right arm.  Also have her undergo a right hip intra-articular injection.  She will follow-up after the MRI to go over results and discuss further treatment.  Valium was sent in a to help her with the MRI claustrophobia symptoms.  Follow-Up Instructions: Return for After MRI .   Orders:  No orders of the defined types were placed in this encounter.  Meds ordered this encounter  Medications  . DISCONTD: diazepam (VALIUM) 5 MG tablet    Sig: TAKE ONE TAB ONE HOUR PRIOR TO MRI REPEAT AS NEEDED #2 . ZERO REFILLS    Dispense:  2 tablet    Refill:  0  . diazepam (VALIUM) 5 MG tablet    Sig: TAKE ONE TAB ONE HOUR PRIOR TO MRI REPEAT AS NEEDED #2 . ZERO REFILLS    Dispense:  2 tablet    Refill:  0      Procedures: No procedures performed   Clinical Data: No additional findings.   Subjective:   HPI Pamela [name] returns today to go over the MRI of her right hip.  She continues to have pain in the right hip groin region mainly whenever she goes from a sitting to standing position.  She states with first few steps she has pain in the right groin.  She denies any numbness tingling down the right leg.  She denies any pain in the left hip.  MRI results are reviewed with the patient and shows bilateral pincer type acetabular rims consistent with femoral acetabular impingement.  Also mild to moderate arthritic changes of both hips.  No evidence of AVN. She also underwent EMG  nerve conduction studies of the upper extremities by Dr. Ernestina Patches and this showed moderate C8 radiculopathy on the right and very mild median nerve entrapment on the right.  She continues to have pain radiating down the right arm.  She states she drops things due to the hand numbness tingling and pain.  Feels that her symptoms are becoming worse.  She notes she has pain into the right scapula that radiates down into the ring and fifth finger of the right hand. Review of Systems See HPI  Objective: Vital Signs: There were no vitals taken for this visit.  Physical Exam General: Well-developed well-nourished female no acute distress. Psych: Mood and affect appropriate. Ortho Exam Right hip good internal and external rotation with minimal discomfort.  No tenderness over the right trochanteric region. Right upper extremity: Tenderness along medial border of the right scapula.  Good range of motion the wrist hand.  No met muscle atrophy of the hand.  Subjective decreased sensation involving the right fifth and ring finger to light touch. Specialty Comments:  No specialty comments available.  Imaging: No results found.   PMFS History: Patient Active Problem List  Diagnosis Date Noted  . Severe obesity (BMI >= 40) ([place]) 02/03/2018  . IUD contraception-inserted 10/01/11 10/01/2011  . Obesity 09/03/2011   Past Medical History:  Diagnosis Date  . Asthma   . DUB (dysfunctional uterine bleeding) 2007  . Elevated cholesterol   . H/O menorrhagia 05/01/2006  . History of ovarian cyst 2007  . Hypertension 03/31/2005  . Increased BMI 07/11/2006  . Migraine   . N&V (nausea and vomiting)   . Obesity 2007  . Weight loss 07/11/2006    Family History  Problem Relation Age of Onset  . Diabetes Maternal Grandmother   . Hypertension Mother     Past Surgical History:  Procedure Laterality Date  . BREAST REDUCTION SURGERY  05/1991  . CESAREAN SECTION    . HYSTEROSCOPY  05/01/2006   Social History    Occupational History  . Not on file  Tobacco Use  . Smoking status: Never Smoker  . Smokeless tobacco: Never Used  Substance and Sexual Activity  . Alcohol use: No  . Drug use: No    Frequency: 2.0 times per week  . Sexual activity: Never    Birth control/protection: I.U.D.    Comment: Mirena

## 2018-05-13 ENCOUNTER — Other Ambulatory Visit (INDEPENDENT_AMBULATORY_CARE_PROVIDER_SITE_OTHER): Payer: Self-pay

## 2018-05-13 DIAGNOSIS — M4807 Spinal stenosis, lumbosacral region: Secondary | ICD-10-CM

## 2018-05-13 DIAGNOSIS — M25551 Pain in right hip: Secondary | ICD-10-CM

## 2018-05-26 ENCOUNTER — Telehealth (INDEPENDENT_AMBULATORY_CARE_PROVIDER_SITE_OTHER): Payer: Self-pay

## 2018-05-26 ENCOUNTER — Telehealth (INDEPENDENT_AMBULATORY_CARE_PROVIDER_SITE_OTHER): Payer: Self-pay | Admitting: Orthopaedic Surgery

## 2018-05-26 ENCOUNTER — Ambulatory Visit (INDEPENDENT_AMBULATORY_CARE_PROVIDER_SITE_OTHER): Payer: 59 | Admitting: Physician Assistant

## 2018-05-26 NOTE — Telephone Encounter (Signed)
Can we schedule her with Dr. Junius Roads for an injection please?

## 2018-05-26 NOTE — Telephone Encounter (Signed)
Ok to schedule a right hip joint steroid injection by Dr. Ernestina Patches or Dr. Junius Roads.

## 2018-05-26 NOTE — Telephone Encounter (Signed)
New Message  Pt verbalized wanting to know if she can get injection in her hip to carry her over until she gets her MRI and follows back up with [place].  Please f/u

## 2018-05-26 NOTE — Telephone Encounter (Signed)
Please advise 

## 2018-05-26 NOTE — Telephone Encounter (Signed)
Talked with patient and appointment has been canceled due to not having her MRI done.  MRI has been R/S to 06/02/2018.  Patient will call back to R/S.

## 2018-05-27 ENCOUNTER — Encounter (INDEPENDENT_AMBULATORY_CARE_PROVIDER_SITE_OTHER): Payer: Self-pay | Admitting: Family Medicine

## 2018-05-27 ENCOUNTER — Other Ambulatory Visit: Payer: Self-pay

## 2018-05-27 ENCOUNTER — Ambulatory Visit (INDEPENDENT_AMBULATORY_CARE_PROVIDER_SITE_OTHER): Payer: 59 | Admitting: Family Medicine

## 2018-05-27 DIAGNOSIS — M25551 Pain in right hip: Secondary | ICD-10-CM | POA: Diagnosis not present

## 2018-05-27 DIAGNOSIS — N6019 Diffuse cystic mastopathy of unspecified breast: Secondary | ICD-10-CM | POA: Insufficient documentation

## 2018-05-27 MED ORDER — DIAZEPAM 5 MG PO TABS: ORAL_TABLET | ORAL | 0 refills | Status: DC

## 2018-05-27 MED ORDER — METHYLPREDNISOLONE ACETATE 40 MG/ML IJ SUSP: 40.0000 mg | Freq: Once | INTRAMUSCULAR | Status: DC

## 2018-05-27 MED ORDER — NABUMETONE 750 MG PO TABS: 750.0000 mg | ORAL_TABLET | Freq: Two times a day (BID) | ORAL | 3 refills | Status: DC | PRN

## 2018-05-27 NOTE — Progress Notes (Signed)
Subjective: Patient is here for ultrasound-guided intra-articular right hip injection.  She did not get much relief with trochanteric bursa injection.  MRI showed some arthritic changes.  Ongoing constant pain in her hip and groin area, worse with walking but still painful when trying to sleep.  Objective: She still has good range of motion but moderate pain with passive flexion and internal rotation.  Procedure: Ultrasound-guided right hip injection: After sterile prep with Betadine, injected 8 cc 1% lidocaine without epinephrine and 40 mg methylprednisolone using a 22-gauge spinal needle, passing the needle through the iliofemoral ligament into the femoral head/neck junction.  Injectate was seen filling the joint capsule.  She had very good pain relief during the immediate anesthetic phase.  Follow-up as directed.

## 2018-05-27 NOTE — Telephone Encounter (Signed)
Patient scheduled to come in today at 4 pm.

## 2018-06-02 ENCOUNTER — Other Ambulatory Visit: Payer: 59

## 2018-06-06 ENCOUNTER — Telehealth (INDEPENDENT_AMBULATORY_CARE_PROVIDER_SITE_OTHER): Payer: Self-pay

## 2018-06-06 NOTE — Telephone Encounter (Signed)
Patient would like to Cx appt since her MRI appt was Cx

## 2018-06-09 ENCOUNTER — Ambulatory Visit (INDEPENDENT_AMBULATORY_CARE_PROVIDER_SITE_OTHER): Payer: 59 | Admitting: Orthopaedic Surgery

## 2018-06-12 ENCOUNTER — Encounter: Payer: Self-pay | Admitting: Internal Medicine

## 2018-06-12 ENCOUNTER — Other Ambulatory Visit: Payer: Self-pay

## 2018-06-12 ENCOUNTER — Ambulatory Visit: Payer: 59 | Admitting: Internal Medicine

## 2018-06-12 VITALS — BP 152/90 | HR 71 | Temp 98.4°F | Ht 67.8 in | Wt 317.6 lb

## 2018-06-12 DIAGNOSIS — J302 Other seasonal allergic rhinitis: Secondary | ICD-10-CM | POA: Diagnosis not present

## 2018-06-12 DIAGNOSIS — R0683 Snoring: Secondary | ICD-10-CM | POA: Diagnosis not present

## 2018-06-12 DIAGNOSIS — Z0000 Encounter for general adult medical examination without abnormal findings: Secondary | ICD-10-CM | POA: Diagnosis not present

## 2018-06-12 DIAGNOSIS — Z6841 Body Mass Index (BMI) 40.0 and over, adult: Secondary | ICD-10-CM

## 2018-06-12 DIAGNOSIS — I10 Essential (primary) hypertension: Secondary | ICD-10-CM | POA: Diagnosis not present

## 2018-06-12 LAB — POCT URINALYSIS DIPSTICK
Bilirubin, UA: NEGATIVE
Blood, UA: NEGATIVE
Glucose, UA: NEGATIVE
Ketones, UA: NEGATIVE
Leukocytes, UA: NEGATIVE
Nitrite, UA: NEGATIVE
Protein, UA: NEGATIVE
Spec Grav, UA: 1.025 (ref 1.010–1.025)
Urobilinogen, UA: 1 E.U./dL
pH, UA: 5.5 (ref 5.0–8.0)

## 2018-06-12 LAB — POCT UA - MICROALBUMIN
Albumin/Creatinine Ratio, Urine, POC: <30
Creatinine, POC: 300 mg/dL
Microalbumin Ur, POC: 80 mg/L

## 2018-06-12 MED ORDER — ROSUVASTATIN CALCIUM 20 MG PO TABS: ORAL_TABLET | ORAL | 2 refills | Status: DC

## 2018-06-12 MED ORDER — LORATADINE 10 MG PO TABS: 10.0000 mg | ORAL_TABLET | Freq: Every day | ORAL | 2 refills | Status: DC

## 2018-06-12 MED ORDER — AZILSARTAN-CHLORTHALIDONE 40-25 MG PO TABS: 1.0000 | ORAL_TABLET | Freq: Every day | ORAL | 1 refills | Status: DC

## 2018-06-12 MED ORDER — AMLODIPINE BESYLATE 10 MG PO TABS: 10.0000 mg | ORAL_TABLET | Freq: Every day | ORAL | 2 refills | Status: DC

## 2018-06-12 NOTE — Progress Notes (Addendum)
Subjective:     Patient ID: Pamela [name] , female    DOB: Dec 20, 1965 , 53 y.o.   MRN: [id number]   Chief Complaint  Patient presents with  . Annual Exam  . Hypertension    HPI  She is here today for a full physical examination. She is followed by GYN for her pelvic exams.   Hypertension  This is a chronic problem. The current episode started more than 1 year ago. The problem has been gradually improving since onset. The problem is controlled. Pertinent negatives include no blurred vision, chest pain, palpitations or shortness of breath. Risk factors for coronary artery disease include dyslipidemia, obesity and sedentary lifestyle. Past treatments include angiotensin blockers. The current treatment provides moderate improvement. Compliance problems include exercise.  Identifiable causes of hypertension include sleep apnea.     Past Medical History:  Diagnosis Date  . Asthma   . DUB (dysfunctional uterine bleeding) 2007  . Elevated cholesterol   . H/O menorrhagia 05/01/2006  . History of ovarian cyst 2007  . Hypertension 03/31/2005  . Increased BMI 07/11/2006  . Migraine   . N&V (nausea and vomiting)   . Obesity 2007  . Weight loss 07/11/2006     Family History  Problem Relation Age of Onset  . Diabetes Maternal Grandmother   . Hypertension Mother      Current Outpatient Medications:  .  amLODipine (NORVASC) 10 MG tablet, Take 1 tablet (10 mg total) by mouth daily., Disp: 90 tablet, Rfl: 2 .  Azilsartan-Chlorthalidone (EDARBYCLOR) 40-25 MG TABS, Take 1 tablet by mouth daily., Disp: 90 tablet, Rfl: 1 .  rosuvastatin (CRESTOR) 20 MG tablet, TAKE 1 TABLET IN THE P.M., Disp: 90 tablet, Rfl: 2 .  loratadine (CLARITIN) 10 MG tablet, Take 1 tablet (10 mg total) by mouth daily., Disp: 90 tablet, Rfl: 2  Current Facility-Administered Medications:  .  methylPREDNISolone acetate (DEPO-MEDROL) injection 40 mg, 40 mg, Intra-articular, Once, Hilts, Michael, MD   Allergies  Allergen  Reactions  . Pollen Extract Shortness Of Breath  . Latex Rash  . Codeine   . Shellfish Allergy      The patient states she uses abstinence for birth control. Last LMP was Patient's last menstrual period was 05/09/2018..  Negative for: breast discharge, breast lump(s), breast pain and breast self exam. Associated symptoms include abnormal vaginal bleeding. Pertinent negatives include abnormal bleeding (hematology), anxiety, decreased libido, depression, difficulty falling sleep, dyspareunia, history of infertility, nocturia, sexual dysfunction, sleep disturbances, urinary incontinence, urinary urgency, vaginal discharge and vaginal itching. Diet regular.The patient states her exercise level is  intermittent.  . The patient's tobacco use is:  Social History   Tobacco Use  Smoking Status Never Smoker  Smokeless Tobacco Never Used  . She has been exposed to passive smoke. The patient's alcohol use is:  Social History   Substance and Sexual Activity  Alcohol Use No    Review of Systems  Constitutional: Negative.   HENT: Positive for postnasal drip and rhinorrhea.   Eyes: Negative.  Negative for blurred vision.  Respiratory: Negative for shortness of breath.   Cardiovascular: Negative.  Negative for chest pain and palpitations.  Gastrointestinal: Negative.   Endocrine: Negative.   Genitourinary: Negative.   Musculoskeletal: Negative.   Skin: Negative.   Allergic/Immunologic: Negative.   Neurological: Negative.   Hematological: Negative.   Psychiatric/Behavioral: Negative.      Today's Vitals   06/12/18 1048  BP: (!) 152/90  Pulse: 71  Temp: 98.4 F (36.9 C)  TempSrc: Oral  Weight: (!) 317 lb 9.6 oz (144.1 kg)  Height: 5' 7.8" (1.722 m)   Body mass index is 48.58 kg/m.   Objective:  Physical Exam Vitals signs and nursing note reviewed.  Constitutional:      Appearance: Normal appearance. She is obese.  HENT:     Head: Normocephalic and atraumatic.     Right Ear:  Tympanic membrane, ear canal and external ear normal.     Left Ear: Tympanic membrane, ear canal and external ear normal.     Nose: Nose normal.     Mouth/Throat:     Mouth: Mucous membranes are moist.     Pharynx: Oropharynx is clear.  Eyes:     Extraocular Movements: Extraocular movements intact.     Conjunctiva/sclera: Conjunctivae normal.     Pupils: Pupils are equal, round, and reactive to light.  Neck:     Musculoskeletal: Normal range of motion and neck supple.  Cardiovascular:     Rate and Rhythm: Normal rate and regular rhythm.     Pulses: Normal pulses.     Heart sounds: Normal heart sounds.  Pulmonary:     Effort: Pulmonary effort is normal.     Breath sounds: Normal breath sounds.  Chest:     Breasts:        Right: Normal. No swelling, bleeding, inverted nipple, mass or nipple discharge.        Left: Normal. No swelling, bleeding, inverted nipple, mass or nipple discharge.  Abdominal:     General: Bowel sounds are normal.     Palpations: Abdomen is soft.     Comments: Obese, difficult to assess organomegaly  Genitourinary:    Comments: deferred Musculoskeletal: Normal range of motion.  Skin:    General: Skin is warm and dry.  Neurological:     General: No focal deficit present.     Mental Status: She is alert and oriented to person, place, and time.  Psychiatric:        Mood and Affect: Mood normal.        Behavior: Behavior normal.         Assessment And Plan:     1. Routine general medical examination at health care facility  A full exam was performed.  Importance of monthly self breast exams was discussed with the patient. PATIENT HAS BEEN ADVISED TO GET 30-45 MINUTES REGULAR EXERCISE NO LESS THAN FOUR TO FIVE DAYS PER WEEK - BOTH WEIGHTBEARING EXERCISES AND AEROBIC ARE RECOMMENDED.  SHE IS ADVISED TO FOLLOW A HEALTHY DIET WITH AT LEAST SIX FRUITS/VEGGIES PER DAY, DECREASE INTAKE OF RED MEAT, AND TO INCREASE FISH INTAKE TO TWO DAYS PER WEEK.  MEATS/FISH  SHOULD NOT BE FRIED, BAKED OR BROILED IS PREFERABLE.  I SUGGEST WEARING SPF 50 SUNSCREEN ON EXPOSED PARTS AND ESPECIALLY WHEN IN THE DIRECT SUNLIGHT FOR AN EXTENDED PERIOD OF TIME.  PLEASE AVOID FAST FOOD RESTAURANTS AND INCREASE YOUR WATER INTAKE.  - CMP14+EGFR - CBC - Lipid panel - Hemoglobin A1c  2. Essential hypertension  Uncontrolled. Importance of medication, dietary compliance was discussed with the patient.   - EKG 12-Lead  3. Seasonal allergies  She was given rx loratadine 71m daily. She will let me know if her symptoms persist.   4. Snoring  I will refer her for a sleep study. She reports having one five or more years ago and she was diagnosed with OSA but she never started the CPAP.   - Ambulatory referral to Neurology  5. Class 3 severe obesity due to excess calories with serious comorbidity and body mass index (BMI) of 45.0 to 49.9 in adult [facility])  Importance of achieving optimal weight to decrease risk of cardiovascular disease and cancers was discussed with the patient in full detail. She is encouraged to start slowly - start with 10 minutes twice daily at least three to four days per week and to gradually build to 30 minutes five days weekly. She was given tips to incorporate more activity into her daily routine - take stairs when possible, park farther away from her job, grocery stores, etc. .     [name], MD    THE PATIENT IS ENCOURAGED TO PRACTICE SOCIAL DISTANCING DUE TO THE COVID-19 PANDEMIC.

## 2018-06-12 NOTE — Patient Instructions (Signed)

## 2018-06-13 LAB — HEMOGLOBIN A1C
Est. average glucose Bld gHb Est-mCnc: 111 mg/dL
Hgb A1c MFr Bld: 5.5 % (ref 4.8–5.6)

## 2018-06-13 LAB — CMP14+EGFR
ALT: 11 IU/L (ref 0–32)
AST: 14 IU/L (ref 0–40)
Albumin/Globulin Ratio: 1.6 (ref 1.2–2.2)
Albumin: 4.3 g/dL (ref 3.8–4.9)
Alkaline Phosphatase: 68 IU/L (ref 39–117)
BUN/Creatinine Ratio: 12 (ref 9–23)
BUN: 11 mg/dL (ref 6–24)
Bilirubin Total: 0.5 mg/dL (ref 0.0–1.2)
CO2: 25 mmol/L (ref 20–29)
Calcium: 9.7 mg/dL (ref 8.7–10.2)
Chloride: 97 mmol/L (ref 96–106)
Creatinine, Ser: 0.89 mg/dL (ref 0.57–1.00)
GFR calc Af Amer: 87 mL/min/{1.73_m2} (ref >59)
GFR calc non Af Amer: 75 mL/min/{1.73_m2} (ref >59)
Globulin, Total: 2.7 g/dL (ref 1.5–4.5)
Glucose: 86 mg/dL (ref 65–99)
Potassium: 4 mmol/L (ref 3.5–5.2)
Sodium: 141 mmol/L (ref 134–144)
Total Protein: 7 g/dL (ref 6.0–8.5)

## 2018-06-13 LAB — CBC
Hematocrit: 35.8 % (ref 34.0–46.6)
Hemoglobin: 11.7 g/dL (ref 11.1–15.9)
MCH: 27.1 pg (ref 26.6–33.0)
MCHC: 32.7 g/dL (ref 31.5–35.7)
MCV: 83 fL (ref 79–97)
Platelets: 216 10*3/uL (ref 150–450)
RBC: 4.32 x10E6/uL (ref 3.77–5.28)
RDW: 13.8 % (ref 11.7–15.4)
WBC: 7.4 10*3/uL (ref 3.4–10.8)

## 2018-06-13 LAB — LIPID PANEL
Chol/HDL Ratio: 4 ratio (ref 0.0–4.4)
Cholesterol, Total: 232 mg/dL — ABNORMAL HIGH (ref 100–199)
HDL: 58 mg/dL (ref >39)
LDL Calculated: 158 mg/dL — ABNORMAL HIGH (ref 0–99)
Triglycerides: 82 mg/dL (ref 0–149)
VLDL Cholesterol Cal: 16 mg/dL (ref 5–40)

## 2018-06-19 ENCOUNTER — Ambulatory Visit (INDEPENDENT_AMBULATORY_CARE_PROVIDER_SITE_OTHER): Payer: Self-pay | Admitting: Physical Medicine and Rehabilitation

## 2018-06-24 ENCOUNTER — Other Ambulatory Visit: Payer: Self-pay

## 2018-06-24 ENCOUNTER — Telehealth: Payer: Self-pay

## 2018-06-24 NOTE — Telephone Encounter (Signed)
Patient called requesting a refill on her inhaler. I advised pt to call the pharmacy and have them send [place] over a request due to [place] not having an inhaler on file. YRL,RMA

## 2018-06-25 ENCOUNTER — Encounter: Payer: Self-pay | Admitting: Internal Medicine

## 2018-06-25 ENCOUNTER — Ambulatory Visit: Payer: 59 | Admitting: Internal Medicine

## 2018-06-25 ENCOUNTER — Other Ambulatory Visit: Payer: Self-pay

## 2018-06-25 VITALS — Ht 67.8 in

## 2018-06-25 DIAGNOSIS — J4520 Mild intermittent asthma, uncomplicated: Secondary | ICD-10-CM

## 2018-06-25 DIAGNOSIS — J302 Other seasonal allergic rhinitis: Secondary | ICD-10-CM | POA: Diagnosis not present

## 2018-06-25 DIAGNOSIS — M79641 Pain in right hand: Secondary | ICD-10-CM

## 2018-06-25 MED ORDER — CELECOXIB 200 MG PO CAPS: ORAL_CAPSULE | ORAL | 2 refills | Status: DC

## 2018-06-25 MED ORDER — ALBUTEROL SULFATE HFA 108 (90 BASE) MCG/ACT IN AERS: 2.0000 | INHALATION_SPRAY | Freq: Four times a day (QID) | RESPIRATORY_TRACT | 2 refills | Status: DC | PRN

## 2018-06-25 NOTE — Patient Instructions (Signed)
Hand Pain  Many things can cause hand pain. Some common causes are:  · An injury.  · Repeating the same movement with your hand over and over (overuse).  · Osteoporosis.  · Arthritis.  · Lumps in the tendons or joints of the hand and wrist (ganglion cysts).  · Infection.  Follow these instructions at home:  Pay attention to any changes in your symptoms. Take these actions to help with your discomfort:  · If directed, put ice on the affected area:  ? Put ice in a plastic bag.  ? Place a towel between your skin and the bag.  ? Leave the ice on for 15-20 minutes, 3?4 times a day for 2 days.  · Take over-the-counter and prescription medicines only as told by your health care provider.  · Minimize stress on your hands and wrists as much as possible.  · Take breaks from repetitive activity often.  · Do stretches as told by your health care provider.  · Do not do activities that make your pain worse.  Contact a health care provider if:  · Your pain does not get better after a few days of self-care.  · Your pain gets worse.  · Your pain affects your ability to do your daily activities.  Get help right away if:  · Your hand becomes warm, red, or swollen.  · Your hand is numb or tingling.  · Your hand is extremely swollen or deformed.  · Your hand or fingers turn white or blue.  · You cannot move your hand, wrist, or fingers.  This information is not intended to replace advice given to you by your health care provider. Make sure you discuss any questions you have with your health care provider.  Document Released: 03/18/2015 Document Revised: 07/28/2015 Document Reviewed: 03/17/2014  Elsevier Interactive Patient Education © 2019 Elsevier Inc.

## 2018-07-01 NOTE — Progress Notes (Signed)
Virtual Visit via Video   This visit type was conducted due to national recommendations for restrictions regarding the COVID-19 Pandemic (e.g. social distancing) in an effort to limit this patient's exposure and mitigate transmission in our community.  Due to her co-morbid illnesses, this patient is at least at moderate risk for complications without adequate follow up.  This format is felt to be most appropriate for this patient at this time.  All issues noted in this document were discussed and addressed.  A limited physical exam was performed with this format.    This visit type was conducted due to national recommendations for restrictions regarding the COVID-19 Pandemic (e.g. social distancing) in an effort to limit this patient's exposure and mitigate transmission in our community.  Patients identity confirmed using two different identifiers.  This format is felt to be most appropriate for this patient at this time.  All issues noted in this document were discussed and addressed.  No physical exam was performed (except for noted visual exam findings with Video Visits).    Date:  07/01/2018   ID:  Pamela [name], DOB [birth year], MRN [id number]  Patient Location:  Home  Provider location:   Office    Chief Complaint:  Allergy f/u  History of Present Illness:    Pamela [name] is a 53 y.o. female who presents via video conferencing for a telehealth visit today.    The patient does not have symptoms concerning for COVID-19 infection (fever, chills, cough, or new shortness of breath).   She presents today for virtual visit. She preferred this method of contact b/c COVID-19 pandemic. She is concerned about her allergy sx. She has not had any relief with use of tylenol and decongestant prn. She has sinus congestion, rhinorrhea and occasional dry cough. She denies having any fever/chills.     Past Medical History:  Diagnosis Date  . Asthma   . DUB (dysfunctional uterine  bleeding) 2007  . Elevated cholesterol   . H/O menorrhagia 05/01/2006  . History of ovarian cyst 2007  . Hypertension 03/31/2005  . Increased BMI 07/11/2006  . Migraine   . N&V (nausea and vomiting)   . Obesity 2007  . Weight loss 07/11/2006   Past Surgical History:  Procedure Laterality Date  . BREAST REDUCTION SURGERY  05/1991  . CESAREAN SECTION    . HYSTEROSCOPY  05/01/2006     Current Meds  Medication Sig  . amLODipine (NORVASC) 10 MG tablet Take 1 tablet (10 mg total) by mouth daily.  . Azilsartan-Chlorthalidone (EDARBYCLOR) 40-25 MG TABS Take 1 tablet by mouth daily.  Marland Kitchen loratadine (CLARITIN) 10 MG tablet Take 1 tablet (10 mg total) by mouth daily.  . rosuvastatin (CRESTOR) 20 MG tablet TAKE 1 TABLET IN THE P.M.   Current Facility-Administered Medications for the 06/25/18 encounter (Office Visit) with [name], MD  Medication  . methylPREDNISolone acetate (DEPO-MEDROL) injection 40 mg     Allergies:   Pollen extract; Latex; Codeine; and Shellfish allergy   Social History   Tobacco Use  . Smoking status: Never Smoker  . Smokeless tobacco: Never Used  Substance Use Topics  . Alcohol use: No  . Drug use: No    Frequency: 2.0 times per week     Family Hx: The patient's family history includes Diabetes in her maternal grandmother; Heart attack in her father; Hypertension in her father and mother.  ROS:   Please see the history of present illness.    Review of Systems  Constitutional: Negative.   HENT: Positive for congestion.   Respiratory: Negative.   Cardiovascular: Negative.   Gastrointestinal: Negative.   Musculoskeletal: Positive for joint pain (she c/o r hand pain. it is painful to make a fish. unable to determine what triggers her sx. ).  Neurological: Negative.   Psychiatric/Behavioral: Negative.     All other systems reviewed and are negative.   Labs/Other Tests and Data Reviewed:    Recent Labs: 06/12/2018: ALT 11; BUN 11; Creatinine, Ser 0.89;  Hemoglobin 11.7; Platelets 216; Potassium 4.0; Sodium 141   Recent Lipid Panel Lab Results  Component Value Date/Time   CHOL 232 (H) 06/12/2018 12:51 PM   TRIG 82 06/12/2018 12:51 PM   HDL 58 06/12/2018 12:51 PM   CHOLHDL 4.0 06/12/2018 12:51 PM   LDLCALC 158 (H) 06/12/2018 12:51 PM    Wt Readings from Last 3 Encounters:  06/12/18 (!) 317 lb 9.6 oz (144.1 kg)  03/27/18 (!) 313 lb (142 kg)  08/29/17 (!) 313 lb (142 kg)     Exam:    Vital Signs:  Ht 5' 7.8" (1.722 m)   BMI 48.58 kg/m     Physical Exam  Constitutional: She is oriented to person, place, and time and well-developed, well-nourished, and in no distress.  HENT:  Head: Normocephalic and atraumatic.  Neck: Normal range of motion.  Pulmonary/Chest: Effort normal.  Musculoskeletal:     Comments: r hand swelling at MCP joints. No overlying erythema. She performed squeeze test on herself which was negative.   Neurological: She is alert and oriented to person, place, and time.  Psychiatric: Affect normal.  Nursing note and vitals reviewed.   ASSESSMENT & PLAN:     1. Seasonal allergies  She was given rx loratadine 10mg  daily. She will let me know if her sx persist.   2. Mild intermittent reactive airway disease without complication  Chronic. She was given refill Ventolin HFA 2 puffs q6h prn.   3. Right hand pain  She agrees to come in for additional bloodwork. I will make further recommendations once her labs are available for review. She is encouraged to avoid processed foods which can promote inflammatory conditions.   - ANA, IFA (with reflex); Future - CYCLIC CITRUL PEPTIDE ANTIBODY, IGG/IGA; Future - Rheumatoid factor; Future - Sedimentation rate; Future - Uric acid; Future    COVID-19 Education: The signs and symptoms of COVID-19 were discussed with the patient and how to seek care for testing (follow up with PCP or arrange E-visit).  The importance of social distancing was discussed today.   Patient Risk:   After full review of this patients clinical status, I feel that they are at least moderate risk at this time.  Time:   Today, I have spent 12 minutes/ 30 seconds with the patient with telehealth technology discussing above diagnoses.     Medication Adjustments/Labs and Tests Ordered: Current medicines are reviewed at length with the patient today.  Concerns regarding medicines are outlined above.   Tests Ordered: Orders Placed This Encounter  Procedures  . ANA, IFA (with reflex)  . CYCLIC CITRUL PEPTIDE ANTIBODY, IGG/IGA  . Rheumatoid factor  . Sedimentation rate  . Uric acid    Medication Changes: Meds ordered this encounter  Medications  . albuterol (VENTOLIN HFA) 108 (90 Base) MCG/ACT inhaler    Sig: Inhale 2 puffs into the lungs every 6 (six) hours as needed for wheezing or shortness of breath.    Dispense:  1 Inhaler  Refill:  2  . celecoxib (CELEBREX) 200 MG capsule    Sig: One capsule po qd prn joint pain    Dispense:  30 capsule    Refill:  2    Disposition:  Follow up prn  Signed, [name], MD

## 2018-07-02 ENCOUNTER — Other Ambulatory Visit: Payer: Self-pay

## 2018-07-02 ENCOUNTER — Other Ambulatory Visit: Payer: 59

## 2018-07-02 DIAGNOSIS — M79641 Pain in right hand: Secondary | ICD-10-CM

## 2018-07-04 LAB — RHEUMATOID FACTOR: Rheumatoid fact SerPl-aCnc: <10 IU/mL (ref 0.0–13.9)

## 2018-07-04 LAB — URIC ACID: Uric Acid: 5 mg/dL (ref 2.5–7.1)

## 2018-07-04 LAB — SEDIMENTATION RATE: Sed Rate: 10 mm/hr (ref 0–40)

## 2018-07-04 LAB — ANTINUCLEAR ANTIBODIES, IFA: ANA Titer 1: NEGATIVE

## 2018-07-04 LAB — CYCLIC CITRUL PEPTIDE ANTIBODY, IGG/IGA: Cyclic Citrullin Peptide Ab: 9 units (ref 0–19)

## 2018-07-22 ENCOUNTER — Other Ambulatory Visit: Payer: 59

## 2018-08-06 ENCOUNTER — Other Ambulatory Visit: Payer: Self-pay

## 2018-08-06 ENCOUNTER — Encounter: Payer: Self-pay | Admitting: Orthopaedic Surgery

## 2018-08-06 ENCOUNTER — Ambulatory Visit (INDEPENDENT_AMBULATORY_CARE_PROVIDER_SITE_OTHER): Payer: 59 | Admitting: Orthopaedic Surgery

## 2018-08-06 DIAGNOSIS — M1611 Unilateral primary osteoarthritis, right hip: Secondary | ICD-10-CM

## 2018-08-06 NOTE — Progress Notes (Signed)
Office Visit Note   Patient: Pamela [name]           Date of Birth: [birth year]           MRN: [id number] Visit Date: 08/06/2018              Requested by: [name], [address] [place], [place] [address] PCP: [name], MD   Assessment & Plan: Visit Diagnoses:  1. Primary osteoarthritis of right hip     Plan: Patient would like to work on weight loss before considering any type of surgery.  Dr. Ninfa Linden did discuss with her right total hip arthroplasty.  Handout on right total hip arthroplasty anterior approach was given.  Questions encouraged and answered by Dr. Ninfa Linden and myself.  We will see her back in 2 months to see what kind of progress she is made on weight loss and to see what type of pain she has in her right hip.  Follow-Up Instructions: Return in about 2 months (around 10/06/2018).   Orders:  No orders of the defined types were placed in this encounter.  No orders of the defined types were placed in this encounter.     Procedures: No procedures performed   Clinical Data: No additional findings.   Subjective: Chief Complaint  Patient presents with  . Right Hip - Follow-up    HPI Pamela [name] returns today follow-up intra-articular injection of the right hip.  States that the injection only helped for about 2 to 3 days she states she had no real pain in her right hip and was able to do steps were easily.  She now feels that the hip pain is worse than it was.  She is having difficulty ambulating.  However she does not use any assistive device.  She has had no new injury to the hip.  She has been working on weight loss and reports her weight to be at 304 pounds she is 5 foot 9.  Review of Systems Please see HPI otherwise negative  Objective: Vital Signs: There were no vitals taken for this visit.  Physical Exam General: Well-developed well-nourished female no acute distress mood and affect appropriate Ortho Exam Right hip  good external rotation without pain.  Limited internal rotation with pain.  Left hip full range of motion without pain. Specialty Comments:  No specialty comments available.  Imaging: No results found.   PMFS History: Patient Active Problem List   Diagnosis Date Noted  . Primary osteoarthritis of right hip 08/06/2018  . Seasonal allergies 06/12/2018  . Fibrocystic disease of breast 05/27/2018  . Severe obesity (BMI >= 40) ([place]) 02/03/2018  . Obstructive sleep apnea syndrome, moderate 10/27/2013  . Shift work sleep disorder 10/27/2013  . Psychic factors associated with diseases classified elsewhere 08/07/2013  . High cholesterol 07/21/2013  . HTN (hypertension) 07/21/2013  . Snoring 07/21/2013  . IUD contraception-inserted 10/01/11 10/01/2011  . Obesity 09/03/2011   Past Medical History:  Diagnosis Date  . Asthma   . DUB (dysfunctional uterine bleeding) 2007  . Elevated cholesterol   . H/O menorrhagia 05/01/2006  . History of ovarian cyst 2007  . Hypertension 03/31/2005  . Increased BMI 07/11/2006  . Migraine   . N&V (nausea and vomiting)   . Obesity 2007  . Weight loss 07/11/2006    Family History  Problem Relation Age of Onset  . Diabetes Maternal Grandmother   . Hypertension Mother   . Hypertension Father   . Heart  attack Father     Past Surgical History:  Procedure Laterality Date  . BREAST REDUCTION SURGERY  05/1991  . CESAREAN SECTION    . HYSTEROSCOPY  05/01/2006   Social History   Occupational History  . Not on file  Tobacco Use  . Smoking status: Never Smoker  . Smokeless tobacco: Never Used  Substance and Sexual Activity  . Alcohol use: No  . Drug use: No    Frequency: 2.0 times per week  . Sexual activity: Never    Birth control/protection: I.U.D.    Comment: Mirena

## 2018-08-08 ENCOUNTER — Encounter: Payer: Self-pay | Admitting: Internal Medicine

## 2018-10-06 ENCOUNTER — Ambulatory Visit: Payer: 59 | Admitting: Physician Assistant

## 2018-10-06 ENCOUNTER — Ambulatory Visit: Payer: 59 | Admitting: Orthopaedic Surgery

## 2018-10-27 ENCOUNTER — Encounter: Payer: Self-pay | Admitting: Internal Medicine

## 2018-12-18 ENCOUNTER — Ambulatory Visit: Payer: 59 | Admitting: Internal Medicine

## 2018-12-22 ENCOUNTER — Ambulatory Visit: Payer: 59 | Admitting: Internal Medicine

## 2019-01-19 LAB — HM PAP SMEAR: HM Pap smear: NORMAL

## 2019-01-20 ENCOUNTER — Encounter: Payer: Self-pay | Admitting: Internal Medicine

## 2019-05-11 ENCOUNTER — Telehealth: Payer: Self-pay

## 2019-05-11 NOTE — Telephone Encounter (Signed)
Patient called in stated she was experiencing shortness of breath directed patient to the ER but she stated she can wait until Wednesday 05/13/2019 for appt with [facility] , appt scheduled for 05/13/2019 at 8:30am

## 2019-05-13 ENCOUNTER — Other Ambulatory Visit: Payer: Self-pay

## 2019-05-13 ENCOUNTER — Encounter: Payer: Self-pay | Admitting: Nurse Practitioner

## 2019-05-13 ENCOUNTER — Ambulatory Visit (INDEPENDENT_AMBULATORY_CARE_PROVIDER_SITE_OTHER): Payer: 59 | Admitting: Nurse Practitioner

## 2019-05-13 VITALS — BP 138/70 | HR 69 | Temp 98.4°F | Ht 67.8 in | Wt 310.0 lb

## 2019-05-13 DIAGNOSIS — R0600 Dyspnea, unspecified: Secondary | ICD-10-CM

## 2019-05-13 DIAGNOSIS — Z6841 Body Mass Index (BMI) 40.0 and over, adult: Secondary | ICD-10-CM

## 2019-05-13 DIAGNOSIS — I10 Essential (primary) hypertension: Secondary | ICD-10-CM | POA: Diagnosis not present

## 2019-05-13 DIAGNOSIS — R0609 Other forms of dyspnea: Secondary | ICD-10-CM

## 2019-05-13 MED ORDER — TRIAMCINOLONE ACETONIDE 40 MG/ML IJ SUSP
60.0000 mg | Freq: Once | INTRAMUSCULAR | Status: AC
  Administered 2019-05-13: 60 mg via INTRAMUSCULAR

## 2019-05-13 NOTE — Patient Instructions (Signed)
Shortness of Breath, Adult Shortness of breath means you have trouble breathing. Shortness of breath could be a sign of a medical problem. Follow these instructions at home:   Watch for any changes in your symptoms.  Do not use any products that contain nicotine or tobacco, such as cigarettes, e-cigarettes, and chewing tobacco.  Do not smoke. Smoking can cause shortness of breath. If you need help to quit smoking, ask your doctor.  Avoid things that can make it harder to breathe, such as: ? Mold. ? Dust. ? Air pollution. ? Chemical smells. ? Things that can cause allergy symptoms (allergens), if you have allergies.  Keep your living space clean. Use products that help remove mold and dust.  Rest as needed. Slowly return to your normal activities.  Take over-the-counter and prescription medicines only as told by your doctor. This includes oxygen therapy and inhaled medicines.  Keep all follow-up visits as told by your doctor. This is important. Contact a doctor if:  Your condition does not get better as soon as expected.  You have a hard time doing your normal activities, even after you rest.  You have new symptoms. Get help right away if:  Your shortness of breath gets worse.  You have trouble breathing when you are resting.  You feel light-headed or you pass out (faint).  You have a cough that is not helped by medicines.  You cough up blood.  You have pain with breathing.  You have pain in your chest, arms, shoulders, or belly (abdomen).  You have a fever.  You cannot walk up stairs.  You cannot exercise the way you normally do. These symptoms may represent a serious problem that is an emergency. Do not wait to see if the symptoms will go away. Get medical help right away. Call your local emergency services (911 in the [place] Do not drive yourself to the hospital. Summary  Shortness of breath is when you have trouble breathing enough air. It can be a sign of a  medical problem.  Avoid things that make it hard for you to breathe, such as smoking, pollution, mold, and dust.  Watch for any changes in your symptoms. Contact your doctor if you do not get better or you get worse. This information is not intended to replace advice given to you by your health care provider. Make sure you discuss any questions you have with your health care provider. Document Revised: 07/22/2017 Document Reviewed: 07/22/2017 Elsevier Patient Education  [place] an over [employer], newspaper 81 mg daily

## 2019-05-13 NOTE — Progress Notes (Addendum)
This visit occurred during the SARS-CoV-2 public health emergency.  Safety protocols were in place, including screening questions prior to the visit, additional usage of staff PPE, and extensive cleaning of exam room while observing appropriate contact time as indicated for disinfecting solutions.  Subjective:     Patient ID: Pamela [name] , female    DOB: 1965-04-05 , 54 y.o.   MRN: [id number]   Chief Complaint  Patient presents with  . Shortness of Breath    patient stated her sob started last sunday. patient stated she gets sob while being active.     HPI  Wt Readings from Last 3 Encounters: 05/13/19 : (!) 310 lb (140.6 kg) 06/12/18 : (!) 317 lb 9.6 oz (144.1 kg) 03/27/18 : (!) 313 lb (142 kg)   Shortness of Breath This is a recurrent (experienced back in January - negative Covid) problem. The current episode started in the past 7 days. The problem occurs intermittently (when walking and up moving around). The problem has been gradually worsening. Pertinent negatives include no abdominal pain, chest pain, fever, leg pain, leg swelling, orthopnea, sore throat or wheezing. Risk factors: IUD. She has tried ipratropium inhalers for the symptoms. Her past medical history is significant for asthma.     Past Medical History:  Diagnosis Date  . Asthma   . DUB (dysfunctional uterine bleeding) 2007  . Elevated cholesterol   . H/O menorrhagia 05/01/2006  . History of ovarian cyst 2007  . Hypertension 03/31/2005  . Increased BMI 07/11/2006  . Migraine   . N&V (nausea and vomiting)   . Obesity 2007  . Weight loss 07/11/2006     Family History  Problem Relation Age of Onset  . Diabetes Maternal Grandmother   . Hypertension Mother   . Hypertension Father   . Heart attack Father      Current Outpatient Medications:  .  albuterol (VENTOLIN HFA) 108 (90 Base) MCG/ACT inhaler, Inhale 2 puffs into the lungs every 6 (six) hours as needed for wheezing or shortness of breath., Disp: 1  Inhaler, Rfl: 2 .  amLODipine (NORVASC) 10 MG tablet, Take 1 tablet (10 mg total) by mouth daily., Disp: 90 tablet, Rfl: 2 .  Azilsartan-Chlorthalidone (EDARBYCLOR) 40-25 MG TABS, Take 1 tablet by mouth daily., Disp: 90 tablet, Rfl: 1 .  rosuvastatin (CRESTOR) 20 MG tablet, TAKE 1 TABLET IN THE P.M., Disp: 90 tablet, Rfl: 2 .  celecoxib (CELEBREX) 200 MG capsule, One capsule po qd prn joint pain (Patient not taking: Reported on 05/13/2019), Disp: 30 capsule, Rfl: 2 .  loratadine (CLARITIN) 10 MG tablet, Take 1 tablet (10 mg total) by mouth daily. (Patient not taking: Reported on 05/13/2019), Disp: 90 tablet, Rfl: 2  Current Facility-Administered Medications:  .  methylPREDNISolone acetate (DEPO-MEDROL) injection 40 mg, 40 mg, Intra-articular, Once, Hilts, Michael, MD   Allergies  Allergen Reactions  . Pollen Extract Shortness Of Breath  . Latex Rash  . Codeine   . Shellfish Allergy      Review of Systems  Constitutional: Negative for fever.  HENT: Negative for sore throat.   Respiratory: Positive for shortness of breath. Negative for wheezing.   Cardiovascular: Negative for chest pain, orthopnea and leg swelling.  Gastrointestinal: Negative for abdominal pain.  Psychiatric/Behavioral: Negative.      Today's Vitals   05/13/19 0906  BP: 138/70  Pulse: 69  Temp: 98.4 F (36.9 C)  TempSrc: Oral  SpO2: 97%  Weight: (!) 310 lb (140.6 kg)  Height: 5' 7.8" (  1.722 m)  PainSc: 0-No pain   Body mass index is 47.41 kg/m.   Objective:  Physical Exam Vitals reviewed.  Constitutional:      Appearance: She is well-developed.  Pulmonary:     Breath sounds: No decreased breath sounds, wheezing, rhonchi or rales.  Neurological:     General: No focal deficit present.     Mental Status: She is alert and oriented to person, place, and time.     Cranial Nerves: No cranial nerve deficit.  Psychiatric:        Mood and Affect: Mood normal.        Behavior: Behavior normal.          Assessment And Plan:     1. Class 3 severe obesity due to excess calories with serious comorbidity and body mass index (BMI) of 45.0 to 49.9 in adult [facility])  Chronic  She has lost 7 pounds since her last visit in 2020  2. Essential hypertension  Chronic, good control  Continue with current medications  EKG done at [facility] HR 65  3. Dyspnea on exertion  No wheezing noted on physical exam however she does have intermittent shortness of breath when speaking   Will obtain d-dimer to check for clot - CBC no Diff - triamcinolone acetonide (KENALOG-40) injection 60 mg - EKG 12-Lead - D-dimer, quantitative (not at [facility]) - Brain natriuretic peptide   [name], FNP    THE PATIENT IS ENCOURAGED TO PRACTICE SOCIAL DISTANCING DUE TO THE COVID-19 PANDEMIC.

## 2019-05-14 ENCOUNTER — Other Ambulatory Visit: Payer: Self-pay | Admitting: Nurse Practitioner

## 2019-05-14 DIAGNOSIS — R7989 Other specified abnormal findings of blood chemistry: Secondary | ICD-10-CM

## 2019-05-14 DIAGNOSIS — R0609 Other forms of dyspnea: Secondary | ICD-10-CM

## 2019-05-14 DIAGNOSIS — R0600 Dyspnea, unspecified: Secondary | ICD-10-CM

## 2019-05-14 LAB — CBC
Hematocrit: 36.4 % (ref 34.0–46.6)
Hemoglobin: 11.5 g/dL (ref 11.1–15.9)
MCH: 26.6 pg (ref 26.6–33.0)
MCHC: 31.6 g/dL (ref 31.5–35.7)
MCV: 84 fL (ref 79–97)
Platelets: 221 10*3/uL (ref 150–450)
RBC: 4.33 x10E6/uL (ref 3.77–5.28)
RDW: 14.2 % (ref 11.7–15.4)
WBC: 6.8 10*3/uL (ref 3.4–10.8)

## 2019-05-14 LAB — BRAIN NATRIURETIC PEPTIDE: BNP: 108.6 pg/mL — ABNORMAL HIGH (ref 0.0–100.0)

## 2019-05-14 LAB — D-DIMER, QUANTITATIVE: D-DIMER: 1.26 mg/L FEU — ABNORMAL HIGH (ref 0.00–0.49)

## 2019-05-15 ENCOUNTER — Telehealth: Payer: Self-pay | Admitting: Nurse Practitioner

## 2019-05-15 NOTE — Telephone Encounter (Signed)
Called patient to make patient aware of her results, she would like to go to another imaging center.  Will work on this on Monday. I have advised her if she has worsening shortness of breath, chest pain or feels worse to go to ER for further evaluation. She is to continue with the ASA 81 mg daily. She does report feeling better after having the steroid.

## 2019-06-17 ENCOUNTER — Encounter: Payer: Self-pay | Admitting: Internal Medicine

## 2019-06-17 ENCOUNTER — Ambulatory Visit (INDEPENDENT_AMBULATORY_CARE_PROVIDER_SITE_OTHER): Payer: 59 | Admitting: Internal Medicine

## 2019-06-17 ENCOUNTER — Other Ambulatory Visit: Payer: Self-pay

## 2019-06-17 VITALS — BP 128/84 | HR 81 | Temp 98.3°F | Ht 67.0 in | Wt 324.6 lb

## 2019-06-17 DIAGNOSIS — R0683 Snoring: Secondary | ICD-10-CM | POA: Diagnosis not present

## 2019-06-17 DIAGNOSIS — Z0000 Encounter for general adult medical examination without abnormal findings: Secondary | ICD-10-CM

## 2019-06-17 DIAGNOSIS — E559 Vitamin D deficiency, unspecified: Secondary | ICD-10-CM | POA: Diagnosis not present

## 2019-06-17 DIAGNOSIS — I10 Essential (primary) hypertension: Secondary | ICD-10-CM | POA: Diagnosis not present

## 2019-06-17 DIAGNOSIS — Z6841 Body Mass Index (BMI) 40.0 and over, adult: Secondary | ICD-10-CM

## 2019-06-17 LAB — POCT URINALYSIS DIPSTICK
Bilirubin, UA: NEGATIVE
Glucose, UA: NEGATIVE
Ketones, UA: NEGATIVE
Nitrite, UA: NEGATIVE
Protein, UA: POSITIVE — AB
Spec Grav, UA: 1.02 (ref 1.010–1.025)
Urobilinogen, UA: 1 E.U./dL
pH, UA: 7 (ref 5.0–8.0)

## 2019-06-17 LAB — POCT UA - MICROALBUMIN

## 2019-06-17 MED ORDER — AMLODIPINE BESYLATE 10 MG PO TABS: 10.0000 mg | ORAL_TABLET | Freq: Every day | ORAL | 2 refills | Status: DC

## 2019-06-17 MED ORDER — EDARBYCLOR 40-25 MG PO TABS: 1.0000 | ORAL_TABLET | Freq: Every day | ORAL | 2 refills | Status: DC

## 2019-06-17 MED ORDER — ROSUVASTATIN CALCIUM 20 MG PO TABS: ORAL_TABLET | ORAL | 2 refills | Status: DC

## 2019-06-17 MED ORDER — VITAMIN D (ERGOCALCIFEROL) 1.25 MG (50000 UNIT) PO CAPS: 50000.0000 [IU] | ORAL_CAPSULE | ORAL | 0 refills | Status: DC

## 2019-06-17 NOTE — Patient Instructions (Signed)
Health Maintenance, Female Adopting a healthy lifestyle and getting preventive care are important in promoting health and wellness. Ask your health care provider about:  The right schedule for you to have regular tests and exams.  Things you can do on your own to prevent diseases and keep yourself healthy. What should I know about diet, weight, and exercise? Eat a healthy diet   Eat a diet that includes plenty of vegetables, fruits, low-fat dairy products, and lean protein.  Do not eat a lot of foods that are high in solid fats, added sugars, or sodium. Maintain a healthy weight Body mass index (BMI) is used to identify weight problems. It estimates body fat based on height and weight. Your health care provider can help determine your BMI and help you achieve or maintain a healthy weight. Get regular exercise Get regular exercise. This is one of the most important things you can do for your health. Most adults should:  Exercise for at least 150 minutes each week. The exercise should increase your heart rate and make you sweat (moderate-intensity exercise).  Do strengthening exercises at least twice a week. This is in addition to the moderate-intensity exercise.  Spend less time sitting. Even light physical activity can be beneficial. Watch cholesterol and blood lipids Have your blood tested for lipids and cholesterol at 53 years of age, then have this test every 5 years. Have your cholesterol levels checked more often if:  Your lipid or cholesterol levels are high.  You are older than 53 years of age.  You are at high risk for heart disease. What should I know about cancer screening? Depending on your health history and family history, you may need to have cancer screening at various ages. This may include screening for:  Breast cancer.  Cervical cancer.  Colorectal cancer.  Skin cancer.  Lung cancer. What should I know about heart disease, diabetes, and high blood  pressure? Blood pressure and heart disease  High blood pressure causes heart disease and increases the risk of stroke. This is more likely to develop in people who have high blood pressure readings, are of African descent, or are overweight.  Have your blood pressure checked: ? Every 3-5 years if you are 18-39 years of age. ? Every year if you are 40 years old or older. Diabetes Have regular diabetes screenings. This checks your fasting blood sugar level. Have the screening done:  Once every three years after age 40 if you are at a normal weight and have a low risk for diabetes.  More often and at a younger age if you are overweight or have a high risk for diabetes. What should I know about preventing infection? Hepatitis B If you have a higher risk for hepatitis B, you should be screened for this virus. Talk with your health care provider to find out if you are at risk for hepatitis B infection. Hepatitis C Testing is recommended for:  Everyone born from 1945 through 1965.  Anyone with known risk factors for hepatitis C. Sexually transmitted infections (STIs)  Get screened for STIs, including gonorrhea and chlamydia, if: ? You are sexually active and are younger than 53 years of age. ? You are older than 53 years of age and your health care provider tells you that you are at risk for this type of infection. ? Your sexual activity has changed since you were last screened, and you are at increased risk for chlamydia or gonorrhea. Ask your health care provider if   you are at risk.  Ask your health care provider about whether you are at high risk for HIV. Your health care provider may recommend a prescription medicine to help prevent HIV infection. If you choose to take medicine to prevent HIV, you should first get tested for HIV. You should then be tested every 3 months for as long as you are taking the medicine. Pregnancy  If you are about to stop having your period (premenopausal) and  you may become pregnant, seek counseling before you get pregnant.  Take 400 to 800 micrograms (mcg) of folic acid every day if you become pregnant.  Ask for birth control (contraception) if you want to prevent pregnancy. Osteoporosis and menopause Osteoporosis is a disease in which the bones lose minerals and strength with aging. This can result in bone fractures. If you are 65 years old or older, or if you are at risk for osteoporosis and fractures, ask your health care provider if you should:  Be screened for bone loss.  Take a calcium or vitamin D supplement to lower your risk of fractures.  Be given hormone replacement therapy (HRT) to treat symptoms of menopause. Follow these instructions at home: Lifestyle  Do not use any products that contain nicotine or tobacco, such as cigarettes, e-cigarettes, and chewing tobacco. If you need help quitting, ask your health care provider.  Do not use street drugs.  Do not share needles.  Ask your health care provider for help if you need support or information about quitting drugs. Alcohol use  Do not drink alcohol if: ? Your health care provider tells you not to drink. ? You are pregnant, may be pregnant, or are planning to become pregnant.  If you drink alcohol: ? Limit how much you use to 0-1 drink a day. ? Limit intake if you are breastfeeding.  Be aware of how much alcohol is in your drink. In the [place] one drink equals one 12 oz bottle of beer (355 mL), one 5 oz glass of wine (148 mL), or one 1 oz glass of hard liquor (44 mL). General instructions  Schedule regular health, dental, and eye exams.  Stay current with your vaccines.  Tell your health care provider if: ? You often feel depressed. ? You have ever been abused or do not feel safe at home. Summary  Adopting a healthy lifestyle and getting preventive care are important in promoting health and wellness.  Follow your health care provider's instructions about healthy  diet, exercising, and getting tested or screened for diseases.  Follow your health care provider's instructions on monitoring your cholesterol and blood pressure. This information is not intended to replace advice given to you by your health care provider. Make sure you discuss any questions you have with your health care provider. Document Revised: 02/12/2018 Document Reviewed: 02/12/2018 Elsevier Patient Education  2020 [employer]  

## 2019-06-17 NOTE — Progress Notes (Signed)
This visit occurred during the SARS-CoV-2 public health emergency.  Safety protocols were in place, including screening questions prior to the visit, additional usage of staff PPE, and extensive cleaning of exam room while observing appropriate contact time as indicated for disinfecting solutions.  Subjective:     Patient ID: Pamela [name] , female    DOB: Jan 21, 1966 , 54 y.o.   MRN: [id number]   Chief Complaint  Patient presents with  . Annual Exam  . Hypertension    HPI  She is here today for a full physical exam. She is followed by Gyn for her pelvic exams. She has no specific concerns or complaints at this time.   Hypertension This is a chronic problem. The current episode started more than 1 year ago. The problem has been gradually improving since onset. The problem is controlled. Pertinent negatives include no blurred vision, chest pain, palpitations or shortness of breath. Risk factors for coronary artery disease include obesity and sedentary lifestyle.     Past Medical History:  Diagnosis Date  . Asthma   . DUB (dysfunctional uterine bleeding) 2007  . Elevated cholesterol   . H/O menorrhagia 05/01/2006  . History of ovarian cyst 2007  . Hypertension 03/31/2005  . Increased BMI 07/11/2006  . Migraine   . N&V (nausea and vomiting)   . Obesity 2007  . Weight loss 07/11/2006     Family History  Problem Relation Age of Onset  . Diabetes Maternal Grandmother   . Hypertension Mother   . Hypertension Father   . Heart attack Father      Current Outpatient Medications:  .  albuterol (VENTOLIN HFA) 108 (90 Base) MCG/ACT inhaler, Inhale 2 puffs into the lungs every 6 (six) hours as needed for wheezing or shortness of breath., Disp: 1 Inhaler, Rfl: 2 .  amLODipine (NORVASC) 10 MG tablet, Take 1 tablet (10 mg total) by mouth daily., Disp: 90 tablet, Rfl: 2 .  Azilsartan-Chlorthalidone (EDARBYCLOR) 40-25 MG TABS, Take 1 tablet by mouth daily., Disp: 90 tablet, Rfl: 1 .   celecoxib (CELEBREX) 200 MG capsule, One capsule po qd prn joint pain, Disp: 30 capsule, Rfl: 2 .  rosuvastatin (CRESTOR) 20 MG tablet, TAKE 1 TABLET IN THE P.M., Disp: 90 tablet, Rfl: 2 .  loratadine (CLARITIN) 10 MG tablet, Take 1 tablet (10 mg total) by mouth daily. (Patient not taking: Reported on 05/13/2019), Disp: 90 tablet, Rfl: 2  Current Facility-Administered Medications:  .  methylPREDNISolone acetate (DEPO-MEDROL) injection 40 mg, 40 mg, Intra-articular, Once, Hilts, Michael, MD   Allergies  Allergen Reactions  . Pollen Extract Shortness Of Breath  . Latex Rash  . Codeine   . Shellfish Allergy      The patient states she uses none for birth control. Last LMP was Patient's last menstrual period was 06/10/2019 (exact date).. Negative for Dysmenorrhea Negative for: breast discharge, breast lump(s), breast pain and breast self exam. Associated symptoms include abnormal vaginal bleeding. Pertinent negatives include abnormal bleeding (hematology), anxiety, decreased libido, depression, difficulty falling sleep, dyspareunia, history of infertility, nocturia, sexual dysfunction, sleep disturbances, urinary incontinence, urinary urgency, vaginal discharge and vaginal itching. Diet regular.The patient states her exercise level is    . The patient's tobacco use is:  Social History   Tobacco Use  Smoking Status Never Smoker  Smokeless Tobacco Never Used  . She has been exposed to passive smoke. The patient's alcohol use is:  Social History   Substance and Sexual Activity  Alcohol Use No  Review of Systems  Constitutional: Negative.   HENT: Negative.   Eyes: Negative.  Negative for blurred vision.  Respiratory: Negative.  Negative for shortness of breath.   Cardiovascular: Negative.  Negative for chest pain and palpitations.  Endocrine: Negative.   Genitourinary: Negative.   Musculoskeletal: Negative.   Skin: Negative.   Allergic/Immunologic: Negative.   Neurological: Negative.    Hematological: Negative.   Psychiatric/Behavioral: Negative.      Today's Vitals   06/17/19 0955  BP: 128/84  Pulse: 81  Temp: 98.3 F (36.8 C)  Weight: (!) 324 lb 9.6 oz (147.2 kg)  Height: 5\' 7"  (1.702 m)   Body mass index is 50.84 kg/m.   Objective:  Physical Exam Vitals and nursing note reviewed.  Constitutional:      Appearance: Normal appearance. She is obese.  HENT:     Head: Normocephalic and atraumatic.     Right Ear: Tympanic membrane, ear canal and external ear normal.     Left Ear: Tympanic membrane, ear canal and external ear normal.     Nose:     Comments: Deferred. masked    Mouth/Throat:     Comments: Deferred, masked Eyes:     Extraocular Movements: Extraocular movements intact.     Conjunctiva/sclera: Conjunctivae normal.     Pupils: Pupils are equal, round, and reactive to light.  Cardiovascular:     Rate and Rhythm: Normal rate and regular rhythm.     Pulses: Normal pulses.     Heart sounds: Normal heart sounds.  Pulmonary:     Effort: Pulmonary effort is normal.     Breath sounds: Normal breath sounds.  Chest:     Breasts: Tanner Score is 5.        Right: Normal.        Left: Normal.     Comments: Bilateral  Abdominal:     General: Bowel sounds are normal.     Palpations: Abdomen is soft.     Comments: Rounded, soft obese.  Genitourinary:    Comments: deferred Musculoskeletal:        General: Normal range of motion.     Cervical back: Normal range of motion and neck supple.  Skin:    General: Skin is warm and dry.  Neurological:     General: No focal deficit present.     Mental Status: She is alert and oriented to person, place, and time.  Psychiatric:        Mood and Affect: Mood normal.        Behavior: Behavior normal.         Assessment And Plan:     1. Routine general medical examination at health care facility  A full exam was performed.  Importance of monthly self breast exams was discussed with the patient. PATIENT  IS ADVISED TO GET 30-45 MINUTES REGULAR EXERCISE NO LESS THAN FOUR TO FIVE DAYS PER WEEK - BOTH WEIGHTBEARING EXERCISES AND AEROBIC ARE RECOMMENDED.  SHE IS ADVISED TO FOLLOW A HEALTHY DIET WITH AT LEAST SIX FRUITS/VEGGIES PER DAY, DECREASE INTAKE OF RED MEAT, AND TO INCREASE FISH INTAKE TO TWO DAYS PER WEEK.  MEATS/FISH SHOULD NOT BE FRIED, BAKED OR BROILED IS PREFERABLE.  I SUGGEST WEARING SPF 50 SUNSCREEN ON EXPOSED PARTS AND ESPECIALLY WHEN IN THE DIRECT SUNLIGHT FOR AN EXTENDED PERIOD OF TIME.  PLEASE AVOID FAST FOOD RESTAURANTS AND INCREASE YOUR WATER INTAKE.  - POCT Urinalysis Dipstick (8002) - POCT UA - Microalbumin  2. Essential hypertension  Chronic, well controlled. She will continue with current meds. She is encouraged to avoid adding salt to her foods. EKG not performed, she had one done earlier this year. She is encouraged to incorporate more exercise into her daily routine. She was given refills of her medications.   3. Snoring  She agrees to Neuro eval for sleep apnea. Risks associated with untreated OSA was discussed with the patient.   - Ambulatory referral to Neurology  4. Class 3 severe obesity due to excess calories with serious comorbidity and body mass index (BMI) of 50.0 to 59.9 in adult (HCC)  BMI 50. She is encouraged to strive for BMI less than 40 to decrease cardiac risk. She is advised to exercise at least 30 minutes five days per week. Also advised to avoid sugary beverages.   5. Vitamin D deficiency disease  I WILL CHECK A VIT D LEVEL AND SUPPLEMENT AS NEEDED.  ALSO ENCOURAGED TO SPEND 15 MINUTES IN THE SUN DAILY.  - Vitamin D (25 hydroxy)   [name], MD    THE PATIENT IS ENCOURAGED TO PRACTICE SOCIAL DISTANCING DUE TO THE COVID-19 PANDEMIC.

## 2019-06-18 LAB — LIPID PANEL
Chol/HDL Ratio: 4.7 ratio — ABNORMAL HIGH (ref 0.0–4.4)
Cholesterol, Total: 258 mg/dL — ABNORMAL HIGH (ref 100–199)
HDL: 55 mg/dL (ref >39)
LDL Chol Calc (NIH): 186 mg/dL — ABNORMAL HIGH (ref 0–99)
Triglycerides: 98 mg/dL (ref 0–149)
VLDL Cholesterol Cal: 17 mg/dL (ref 5–40)

## 2019-06-18 LAB — VITAMIN D 25 HYDROXY (VIT D DEFICIENCY, FRACTURES): Vit D, 25-Hydroxy: 18.6 ng/mL — ABNORMAL LOW (ref 30.0–100.0)

## 2019-06-18 LAB — CBC
Hematocrit: 37.3 % (ref 34.0–46.6)
Hemoglobin: 12.1 g/dL (ref 11.1–15.9)
MCH: 26.3 pg — ABNORMAL LOW (ref 26.6–33.0)
MCHC: 32.4 g/dL (ref 31.5–35.7)
MCV: 81 fL (ref 79–97)
Platelets: 229 10*3/uL (ref 150–450)
RBC: 4.6 x10E6/uL (ref 3.77–5.28)
RDW: 13.7 % (ref 11.7–15.4)
WBC: 6.3 10*3/uL (ref 3.4–10.8)

## 2019-06-18 LAB — CMP14+EGFR
ALT: 14 IU/L (ref 0–32)
AST: 14 IU/L (ref 0–40)
Albumin/Globulin Ratio: 2 (ref 1.2–2.2)
Albumin: 4.5 g/dL (ref 3.8–4.9)
Alkaline Phosphatase: 80 IU/L (ref 39–117)
BUN/Creatinine Ratio: 8 — ABNORMAL LOW (ref 9–23)
BUN: 8 mg/dL (ref 6–24)
Bilirubin Total: 0.7 mg/dL (ref 0.0–1.2)
CO2: 27 mmol/L (ref 20–29)
Calcium: 9.5 mg/dL (ref 8.7–10.2)
Chloride: 100 mmol/L (ref 96–106)
Creatinine, Ser: 0.98 mg/dL (ref 0.57–1.00)
GFR calc Af Amer: 77 mL/min/{1.73_m2} (ref >59)
GFR calc non Af Amer: 67 mL/min/{1.73_m2} (ref >59)
Globulin, Total: 2.2 g/dL (ref 1.5–4.5)
Glucose: 91 mg/dL (ref 65–99)
Potassium: 4.2 mmol/L (ref 3.5–5.2)
Sodium: 140 mmol/L (ref 134–144)
Total Protein: 6.7 g/dL (ref 6.0–8.5)

## 2019-06-18 LAB — TSH: TSH: 1.14 u[IU]/mL (ref 0.450–4.500)

## 2019-06-18 LAB — INSULIN, RANDOM: INSULIN: 16.1 u[IU]/mL (ref 2.6–24.9)

## 2019-06-24 ENCOUNTER — Telehealth: Payer: Self-pay

## 2019-06-24 NOTE — Telephone Encounter (Signed)
Called patient to discuss lab results, results given.

## 2019-07-02 ENCOUNTER — Encounter: Payer: Self-pay | Admitting: Neurology

## 2019-07-02 ENCOUNTER — Ambulatory Visit: Payer: 59 | Admitting: Neurology

## 2019-07-02 ENCOUNTER — Other Ambulatory Visit: Payer: Self-pay

## 2019-07-02 VITALS — BP 136/90 | HR 62 | Temp 96.9°F | Ht 69.0 in | Wt 326.0 lb

## 2019-07-02 DIAGNOSIS — G4733 Obstructive sleep apnea (adult) (pediatric): Secondary | ICD-10-CM

## 2019-07-02 DIAGNOSIS — R351 Nocturia: Secondary | ICD-10-CM | POA: Diagnosis not present

## 2019-07-02 DIAGNOSIS — Z6841 Body Mass Index (BMI) 40.0 and over, adult: Secondary | ICD-10-CM

## 2019-07-02 DIAGNOSIS — R519 Headache, unspecified: Secondary | ICD-10-CM

## 2019-07-02 NOTE — Progress Notes (Signed)
Subjective:    Patient ID: Pamela [name] is a 54 y.o. female.  HPI     Star Age, MD, PhD [facility] [address], [address] [address] [place], [place] [address]  Dear Dr. Baird Cancer,  I saw your patient, Pamela [name], upon your kind request in sleep clinic today for initial consultation of her sleep disorder, in particular, concern for underlying obstructive sleep apnea.  The patient is unaccompanied today.  As you know, Pamela [name] is a 54 year old right-handed woman with an underlying medical history of hypertension, migraine headaches, hyperlipidemia, asthma, and morbid obesity with a BMI of over 45, who reports snoring and sleep disruption, recurrent AM headaches. I reviewed your office note from 06/17/2019.  Her Epworth sleepiness score is 3 out of 24, fatigue severity score is 27 out of 63.  She is single and lives alone, she has 2 grown children.  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  She may work from 8 AM to 5 PM and from midnight to 5 or 6 AM some other days.  Generally, if she works the next day in the morning she will go to bed between 11 and 12 and rise time varies.  She has the TV on at night on low volume.  She has no pets in the household.  She has woken up with headaches repeatedly, at least 2-3 times per week, more so lately.  She had a sleep study several years ago in [place], probably over 5 or 6 years ago was told she had sleep apnea and was advised to proceed with CPAP therapy.  She did not pursue it at the time.  She was in the process of pursuing bariatric surgery also at the time which did not get completed either.  She decided against bariatric surgery.  She is a non-smoker and drinks alcohol a couple of times a month, caffeine not daily.  Her mother has sleep apnea and uses a CPAP machine.  Patient has gained weight over time.  She has nocturia generally once per  average night.  She would be willing to consider CPAP therapy.  Her Past Medical History Is Significant For: Past Medical History:  Diagnosis Date  . Asthma   . DUB (dysfunctional uterine bleeding) 2007  . Elevated cholesterol   . H/O menorrhagia 05/01/2006  . History of ovarian cyst 2007  . Hypertension 03/31/2005  . Increased BMI 07/11/2006  . Migraine   . N&V (nausea and vomiting)   . Obesity 2007  . Weight loss 07/11/2006    Her Past Surgical History Is Significant For: Past Surgical History:  Procedure Laterality Date  . BREAST REDUCTION SURGERY  05/1991  . CESAREAN SECTION    . HYSTEROSCOPY  05/01/2006    Her Family History Is Significant For: Family History  Problem Relation Age of Onset  . Diabetes Maternal Grandmother   . Hypertension Mother   . Hypertension Father   . Heart attack Father     Her Social History Is Significant For: Social History   Socioeconomic History  . Marital status: Single    Spouse name: Not on file  . Number of children: Not on file  . Years of education: Not on file  . Highest education level: Not on file  Occupational History  . Not on file  Tobacco Use  . Smoking status: Never Smoker  . Smokeless tobacco: Never Used  Substance  and Sexual Activity  . Alcohol use: No  . Drug use: No    Frequency: 2.0 times per week  . Sexual activity: Never    Birth control/protection: I.U.D.    Comment: Mirena  Other Topics Concern  . Not on file  Social History Narrative  . Not on file   Social Determinants of Health   Financial Resource Strain:   . Difficulty of Paying Living Expenses:   Food Insecurity:   . Worried About Charity fundraiser in the Last Year:   . Arboriculturist in the Last Year:   Transportation Needs:   . Film/video editor (Medical):   Marland Kitchen Lack of Transportation (Non-Medical):   Physical Activity:   . Days of Exercise per Week:   . Minutes of Exercise per Session:   Stress:   . Feeling of Stress :   Social  Connections:   . Frequency of Communication with Friends and Family:   . Frequency of Social Gatherings with Friends and Family:   . Attends Religious Services:   . Active Member of Clubs or Organizations:   . Attends Archivist Meetings:   Marland Kitchen Marital Status:     Her Allergies Are:  Allergies  Allergen Reactions  . Pollen Extract Shortness Of Breath  . Latex Rash  . Codeine   . Shellfish Allergy   :   Her Current Medications Are:  Outpatient Encounter Medications as of 07/02/2019  Medication Sig  . albuterol (VENTOLIN HFA) 108 (90 Base) MCG/ACT inhaler Inhale 2 puffs into the lungs every 6 (six) hours as needed for wheezing or shortness of breath.  Marland Kitchen amLODipine (NORVASC) 10 MG tablet Take 1 tablet (10 mg total) by mouth daily.  . Azilsartan-Chlorthalidone (EDARBYCLOR) 40-25 MG TABS Take 1 tablet by mouth daily.  . celecoxib (CELEBREX) 200 MG capsule One capsule po qd prn joint pain  . rosuvastatin (CRESTOR) 20 MG tablet TAKE 1 TABLET IN THE P.M.  . Vitamin D, Ergocalciferol, (DRISDOL) 1.25 MG (50000 UNIT) CAPS capsule Take 1 capsule (50,000 Units total) by mouth every 7 (seven) days.  Marland Kitchen loratadine (CLARITIN) 10 MG tablet Take 1 tablet (10 mg total) by mouth daily. (Patient not taking: Reported on 05/13/2019)   Facility-Administered Encounter Medications as of 07/02/2019  Medication  . methylPREDNISolone acetate (DEPO-MEDROL) injection 40 mg  :  Review of Systems:  Out of a complete 14 point review of systems, all are reviewed and negative with the exception of these symptoms as listed below: Review of Systems  Neurological:       Here for sleep consult. Prior sleep study. CPAP was reccommended but pt did not want to start at that time. Epworth Sleepiness Scale 0= would never doze 1= slight chance of dozing 2= moderate chance of dozing 3= high chance of dozing  Sitting and reading:1 Watching TV:1 Sitting inactive in a public place (ex. Theater or meeting):0 As a  passenger in a car for an hour without a break:0 Lying down to rest in the afternoon:1 Sitting and talking to someone:0 Sitting quietly after lunch (no alcohol):0 In a car, while stopped in traffic:0 Total:3     Objective:  Neurological Exam  Physical Exam Physical Examination:   Vitals:   07/02/19 1116  BP: 136/90  Pulse: 62  Temp: (!) 96.9 F (36.1 C)    General Examination: The patient is a very pleasant 54 y.o. female in no acute distress. She appears well-developed and well-nourished and well groomed.  HEENT: Normocephalic, atraumatic, pupils are equal, round and reactive to light, extraocular tracking is good without limitation to gaze excursion or nystagmus noted. Hearing is grossly intact. Face is symmetric with normal facial animation. Speech is clear with no dysarthria noted. There is no hypophonia. There is no lip, neck/head, jaw or voice tremor. Neck is supple with full range of passive and active motion. There are no carotid bruits on auscultation. Oropharynx exam reveals: mild mouth dryness, good dental hygiene and mild airway crowding, due to smaller airway entry, tonsils small, uvula also on the smaller side, Mallampati class I, neck circumference 15 and three-quarter inches.  She has a mild overbite.  Tongue protrudes centrally in palate elevates symmetrically.  Chest: Clear to auscultation without wheezing, rhonchi or crackles noted.  Heart: S1+S2+0, regular and normal without murmurs, rubs or gallops noted.   Abdomen: Soft, non-tender and non-distended with normal bowel sounds appreciated on auscultation.  Extremities: There is trace pitting edema in the distal lower extremities bilaterally.   Skin: Warm and dry without trophic changes noted.   Musculoskeletal: exam reveals no obvious joint deformities, tenderness or joint swelling or erythema.   Neurologically:  Mental status: The patient is awake, alert and oriented in all 4 spheres. Her immediate and  remote memory, attention, language skills and fund of knowledge are appropriate. There is no evidence of aphasia, agnosia, apraxia or anomia. Speech is clear with normal prosody and enunciation. Thought process is linear. Mood is normal and affect is normal.  Cranial nerves II - XII are as described above under HEENT exam.  Motor exam: Normal bulk, strength and tone is noted. There is no tremor, Romberg is negative. Fine motor skills and coordination: grossly intact.  Cerebellar testing: No dysmetria or intention tremor. There is no truncal or gait ataxia.  Sensory exam: intact to light touch in the upper and lower extremities.  Gait, station and balance: She stands easily. No veering to one side is noted. No leaning to one side is noted. Posture is age-appropriate and stance is narrow based. Gait shows normal stride length and normal pace. No problems turning are noted. Tandem walk is slightly challenging.                 Assessment and Plan:  In summary, Pamela [name] is a very pleasant 54 y.o.-year old female with an underlying medical history of hypertension, migraine headaches, hyperlipidemia, asthma, and morbid obesity with a BMI of over 45, whose history and physical exam are concerning for obstructive sleep apnea (OSA). I had a long chat with the patient about my findings and the diagnosis of OSA, its prognosis and treatment options. We talked about medical treatments, surgical interventions and non-pharmacological approaches. I explained in particular the risks and ramifications of untreated moderate to severe OSA, especially with respect to developing cardiovascular disease down the Road, including congestive heart failure, difficult to treat hypertension, cardiac arrhythmias, or stroke. Even type 2 diabetes has, in part, been linked to untreated OSA. Symptoms of untreated OSA include daytime sleepiness, memory problems, mood irritability and mood disorder such as depression and anxiety, lack  of energy, as well as recurrent headaches, especially morning headaches. We talked about trying to maintain a healthy lifestyle in general, as well as the importance of weight control. We also talked about the importance of good sleep hygiene. I recommended the following at this time: sleep study.   I explained the sleep test procedure to the patient and also outlined possible surgical and  non-surgical treatment options of OSA, including the use of a custom-made dental device (which would require a referral to a specialist dentist or oral surgeon), upper airway surgical options, such as traditional UPPP or a novel less invasive surgical option in the form of Inspire hypoglossal nerve stimulation (which would involve a referral to an ENT surgeon). I also explained the CPAP treatment option to the patient, who indicated that she would be willing to try CPAP if the need arises. I explained the importance of being compliant with PAP treatment, not only for insurance purposes but primarily to improve Her symptoms, and for the patient's long term health benefit, including to reduce Her cardiovascular risks. I answered all her questions today and the patient was in agreement. I plan to see her back after the sleep study is completed and encouraged her to call with any interim questions, concerns, problems or updates.   Thank you very much for allowing me to participate in the care of this nice patient. If I can be of any further assistance to you please do not hesitate to call me at [phone].  Sincerely,   Star Age, MD, PhD

## 2019-07-02 NOTE — Patient Instructions (Signed)

## 2019-07-08 ENCOUNTER — Telehealth (HOSPITAL_COMMUNITY): Payer: Self-pay | Admitting: Nurse Practitioner

## 2019-07-08 NOTE — Telephone Encounter (Signed)
Okay thank you

## 2019-07-08 NOTE — Telephone Encounter (Signed)
Called patient to schedule Echocardiogram per your order and patient declined to schedule. She state that she saw her doctor for a physical and they treated her with some steroids and now she is fine. Order will be removed from the WQ.

## 2019-07-21 ENCOUNTER — Ambulatory Visit (HOSPITAL_COMMUNITY)
Admission: EM | Admit: 2019-07-21 | Discharge: 2019-07-21 | Disposition: A | Discharge status: Home / Self Care | Payer: 59 | Attending: Family Medicine | Admitting: Family Medicine

## 2019-07-21 ENCOUNTER — Other Ambulatory Visit: Payer: Self-pay

## 2019-07-21 ENCOUNTER — Ambulatory Visit (INDEPENDENT_AMBULATORY_CARE_PROVIDER_SITE_OTHER): Payer: 59

## 2019-07-21 ENCOUNTER — Encounter (HOSPITAL_COMMUNITY): Payer: Self-pay

## 2019-07-21 DIAGNOSIS — M79642 Pain in left hand: Secondary | ICD-10-CM

## 2019-07-21 DIAGNOSIS — S42402A Unspecified fracture of lower end of left humerus, initial encounter for closed fracture: Secondary | ICD-10-CM

## 2019-07-21 DIAGNOSIS — S50312A Abrasion of left elbow, initial encounter: Secondary | ICD-10-CM

## 2019-07-21 DIAGNOSIS — M25522 Pain in left elbow: Secondary | ICD-10-CM

## 2019-07-21 DIAGNOSIS — I10 Essential (primary) hypertension: Secondary | ICD-10-CM

## 2019-07-21 MED ORDER — TETANUS-DIPHTH-ACELL PERTUSSIS 5-2.5-18.5 LF-MCG/0.5 IM SUSP
INTRAMUSCULAR | Status: AC
  Filled 2019-07-21: qty 0.5

## 2019-07-21 MED ORDER — TETANUS-DIPHTH-ACELL PERTUSSIS 5-2.5-18.5 LF-MCG/0.5 IM SUSP
0.5000 mL | Freq: Once | INTRAMUSCULAR | Status: AC
  Administered 2019-07-21: 0.5 mL via INTRAMUSCULAR

## 2019-07-21 NOTE — Discharge Instructions (Addendum)
Call your orthopaedist in the morning to schedule an appointment within the next few days. If not allergic, you may use over the counter ibuprofen or acetaminophen as needed.   Your blood pressure was noted to be elevated during your visit today. If you are currently taking medication for high blood pressure, please ensure you are taking this as directed. If you do not have a history of high blood pressure and your blood pressure remains persistently elevated, you may need to begin taking a medication at some point. You may return here within the next few days to recheck if unable to see your primary care provider or if do not have a one.  BP (!) 174/99 (BP Location: Right Arm)   Pulse 83   Temp 97.9 F (36.6 C) (Oral)   Resp 18   Ht 5\' 9"  (1.753 m)   Wt (!) 144.7 kg   SpO2 100%   BMI 47.11 kg/m

## 2019-07-21 NOTE — Progress Notes (Signed)
Orthopedic Tech Progress Note Patient Details:  Pamela [name] 07-May-1965 [id number]  Patient ID: Pamela [name], female   DOB: [birth year], 55 y.o.   MRN: [id number]  Applied post. Long arm splint [name] 07/21/2019, 9:13 PM

## 2019-07-21 NOTE — ED Triage Notes (Signed)
Pt states she tripped and fell and went to break her fall with her hands and injured her left hand and arm. Pt c/o 5/10 throbbing pain in left hand and arm. Pt has full ROM of arm and hand. Pt has wound on left elbow from fall.

## 2019-07-22 NOTE — ED Provider Notes (Signed)
[place]   [id number] 07/21/19 Arrival Time: 1907  ASSESSMENT & PLAN:  1. Pain of left hand   2. Left elbow pain   3. Elbow abrasion, left, initial encounter   4. Elevated blood pressure reading in office with diagnosis of hypertension   5. Closed fracture of left elbow, initial encounter     I have personally viewed the imaging studies ordered this visit. Left proximal ulna fracture.  Post arm splint applied by orthopaedic tech. Prefers OTC analgesics. Has an orthopaedist to schedule prompt f/u.  Simple wound care for elbow abrasion. No suturing needed.   Meds ordered this encounter  Medications  . Tdap (BOOSTRIX) injection 0.5 mL    Orders Placed This Encounter  Procedures  . DG Elbow Complete Left  . DG Hand Complete Left  . Apply splint long arm  . Apply Sling & Swathe    Recommend: Follow-up Information    Schedule an appointment as soon as possible for a visit  with [name], MD.   Specialty: Internal Medicine Why: To recheck your blood pressure. Contact information: [address] [place] [address] [phone]             Discharge Instructions     Call your orthopaedist in the morning to schedule an appointment within the next few days. If not allergic, you may use over the counter ibuprofen or acetaminophen as needed.   Your blood pressure was noted to be elevated during your visit today. If you are currently taking medication for high blood pressure, please ensure you are taking this as directed. If you do not have a history of high blood pressure and your blood pressure remains persistently elevated, you may need to begin taking a medication at some point. You may return here within the next few days to recheck if unable to see your primary care provider or if do not have a one.  BP (!) 174/99 (BP Location: Right Arm)   Pulse 83   Temp 97.9 F (36.6 C) (Oral)   Resp 18   Ht 5\' 9"  (1.753 m)   Wt (!)  144.7 kg   SpO2 100%   BMI 47.11 kg/m        Reviewed expectations re: course of current medical issues. Questions answered. Outlined signs and symptoms indicating need for more acute intervention. Patient verbalized understanding. After Visit Summary given.  SUBJECTIVE: History from: patient. Pamela [name] is a 54 y.o. female who reports persistent moderate pain of her left elbow and hand; described as aching; without radiation. Onset: abrupt. First noted: today. Injury/trama: reports falling onto LUE; immediate discomfort. Symptoms have progressed to a point and plateaued since beginning. Aggravating factors: certain movements. Alleviating factors: have not been identified. Associated symptoms: none reported. Extremity sensation changes or weakness: none. Self treatment: has not tried OTC therapies.  History of similar: no.  Past Surgical History:  Procedure Laterality Date  . BREAST REDUCTION SURGERY  05/1991  . CESAREAN SECTION    . HYSTEROSCOPY  05/01/2006    Increased blood pressure noted today. Reports that she is treated for HTN.  She reports taking medications as instructed, no chest pain on exertion, no dyspnea on exertion, no swelling of ankles, no orthostatic dizziness or lightheadedness, no orthopnea or paroxysmal nocturnal dyspnea and no palpitations.   OBJECTIVE:  Vitals:   07/21/19 1920 07/21/19 1924  BP: (!) 174/99   Pulse: 83   Resp: 18   Temp: 97.9 F (36.6 C)  TempSrc: Oral   SpO2: 100%   Weight:  (!) 144.7 kg  Height:  5\' 9"  (1.753 m)    General appearance: alert; no distress HEENT: [place]; AT Neck: supple with FROM Resp: unlabored respirations Extremities: . LUE: warm with well perfused appearance; poorly localized moderate tenderness over left lateral elbow; without gross deformities; swelling: minimal; bruising: none; small lateral skin abrasion; elbow ROM: slightly limited with extension; normal supination/pronation; is also tender  over proximal hand CV: brisk extremity capillary refill of LLE; 2+ radial pulse of LUE. Skin: warm and dry; no visible rashes Neurologic: gait normal; normal sensation and strength of LUE Psychological: alert and cooperative; normal mood and affect  Imaging: DG Elbow Complete Left  Result Date: 07/21/2019 CLINICAL DATA:  Status post fall. EXAM: LEFT ELBOW - COMPLETE 3+ VIEW COMPARISON:  None. FINDINGS: A 2 mm very mildly displaced fracture fragment is seen along the proximal aspect of the left ulna. This originates from the coronoid process. A small chronic deformity is seen involving the lateral epicondyle of the distal left humerus. There is no evidence of dislocation. There is no evidence of arthropathy or other focal bone abnormality. Mild-to-moderate severity dorsal soft tissue swelling is seen. A mild amount of soft tissue air is also noted. IMPRESSION: 1. Very small mildly displaced fracture of the proximal left ulna. 2. Dorsal soft tissue swelling with a mild amount of soft tissue air. Electronically Signed   By: Virgina Norfolk M.D.   On: 07/21/2019 20:19   DG Hand Complete Left  Result Date: 07/21/2019 CLINICAL DATA:  Status post fall. EXAM: LEFT HAND - COMPLETE 3+ VIEW COMPARISON:  None. FINDINGS: There is no evidence of fracture or dislocation. There is no evidence of arthropathy or other focal bone abnormality. Soft tissues are unremarkable. IMPRESSION: Negative. Electronically Signed   By: Virgina Norfolk M.D.   On: 07/21/2019 20:25      Allergies  Allergen Reactions  . Pollen Extract Shortness Of Breath  . Latex Rash  . Codeine   . Shellfish Allergy   . Betadine [Povidone Iodine] Rash    Past Medical History:  Diagnosis Date  . Asthma   . DUB (dysfunctional uterine bleeding) 2007  . Elevated cholesterol   . H/O menorrhagia 05/01/2006  . History of ovarian cyst 2007  . Hypertension 03/31/2005  . Increased BMI 07/11/2006  . Migraine   . N&V (nausea and vomiting)   .  Obesity 2007  . Weight loss 07/11/2006   Social History   Socioeconomic History  . Marital status: Single    Spouse name: Not on file  . Number of children: Not on file  . Years of education: Not on file  . Highest education level: Not on file  Occupational History  . Not on file  Tobacco Use  . Smoking status: Never Smoker  . Smokeless tobacco: Never Used  Substance and Sexual Activity  . Alcohol use: No  . Drug use: No    Frequency: 2.0 times per week  . Sexual activity: Never    Birth control/protection: I.U.D.    Comment: Mirena  Other Topics Concern  . Not on file  Social History Narrative  . Not on file   Social Determinants of Health   Financial Resource Strain:   . Difficulty of Paying Living Expenses:   Food Insecurity:   . Worried About Charity fundraiser in the Last Year:   . Arboriculturist in the Last Year:   Transportation Needs:   .  Lack of Transportation (Medical):   Marland Kitchen Lack of Transportation (Non-Medical):   Physical Activity:   . Days of Exercise per Week:   . Minutes of Exercise per Session:   Stress:   . Feeling of Stress :   Social Connections:   . Frequency of Communication with Friends and Family:   . Frequency of Social Gatherings with Friends and Family:   . Attends Religious Services:   . Active Member of Clubs or Organizations:   . Attends Archivist Meetings:   Marland Kitchen Marital Status:    Family History  Problem Relation Age of Onset  . Diabetes Maternal Grandmother   . Hypertension Mother   . Hypertension Father   . Heart attack Father    Past Surgical History:  Procedure Laterality Date  . BREAST REDUCTION SURGERY  05/1991  . CESAREAN SECTION    . HYSTEROSCOPY  05/01/2006      Vanessa Kick, MD 07/22/19 [phone]

## 2019-08-10 ENCOUNTER — Ambulatory Visit: Payer: 59

## 2019-09-17 ENCOUNTER — Encounter (HOSPITAL_COMMUNITY): Payer: Self-pay | Admitting: Emergency Medicine

## 2019-09-17 ENCOUNTER — Observation Stay (HOSPITAL_COMMUNITY)
Admission: EM | Admit: 2019-09-17 | Discharge: 2019-09-18 | Disposition: A | Discharge status: Home / Self Care | Payer: 59 | Attending: Internal Medicine | Admitting: Internal Medicine

## 2019-09-17 ENCOUNTER — Ambulatory Visit (INDEPENDENT_AMBULATORY_CARE_PROVIDER_SITE_OTHER): Payer: 59

## 2019-09-17 ENCOUNTER — Emergency Department (HOSPITAL_COMMUNITY): Payer: 59

## 2019-09-17 ENCOUNTER — Ambulatory Visit (HOSPITAL_COMMUNITY)
Admission: EM | Admit: 2019-09-17 | Discharge: 2019-09-17 | Disposition: A | Discharge status: Home / Self Care | Payer: 59 | Source: Home / Self Care

## 2019-09-17 ENCOUNTER — Other Ambulatory Visit: Payer: Self-pay

## 2019-09-17 DIAGNOSIS — I161 Hypertensive emergency: Secondary | ICD-10-CM | POA: Diagnosis not present

## 2019-09-17 DIAGNOSIS — Z79899 Other long term (current) drug therapy: Secondary | ICD-10-CM | POA: Insufficient documentation

## 2019-09-17 DIAGNOSIS — E66813 Obesity, class 3: Secondary | ICD-10-CM

## 2019-09-17 DIAGNOSIS — E782 Mixed hyperlipidemia: Secondary | ICD-10-CM | POA: Diagnosis not present

## 2019-09-17 DIAGNOSIS — Z20822 Contact with and (suspected) exposure to covid-19: Secondary | ICD-10-CM | POA: Insufficient documentation

## 2019-09-17 DIAGNOSIS — R0602 Shortness of breath: Secondary | ICD-10-CM | POA: Diagnosis present

## 2019-09-17 DIAGNOSIS — R778 Other specified abnormalities of plasma proteins: Secondary | ICD-10-CM | POA: Diagnosis not present

## 2019-09-17 DIAGNOSIS — Z6841 Body Mass Index (BMI) 40.0 and over, adult: Secondary | ICD-10-CM | POA: Insufficient documentation

## 2019-09-17 DIAGNOSIS — R0789 Other chest pain: Secondary | ICD-10-CM

## 2019-09-17 DIAGNOSIS — R7989 Other specified abnormal findings of blood chemistry: Secondary | ICD-10-CM | POA: Diagnosis present

## 2019-09-17 DIAGNOSIS — I10 Essential (primary) hypertension: Secondary | ICD-10-CM

## 2019-09-17 LAB — CBC
HCT: 38.5 % (ref 36.0–46.0)
Hemoglobin: 11.9 g/dL — ABNORMAL LOW (ref 12.0–15.0)
MCH: 25.7 pg — ABNORMAL LOW (ref 26.0–34.0)
MCHC: 30.9 g/dL (ref 30.0–36.0)
MCV: 83.2 fL (ref 80.0–100.0)
Platelets: 207 10*3/uL (ref 150–400)
RBC: 4.63 MIL/uL (ref 3.87–5.11)
RDW: 14.8 % (ref 11.5–15.5)
WBC: 8.1 10*3/uL (ref 4.0–10.5)
nRBC: 0 % (ref 0.0–0.2)

## 2019-09-17 LAB — BASIC METABOLIC PANEL
Anion gap: 11 (ref 5–15)
BUN: 8 mg/dL (ref 6–20)
CO2: 25 mmol/L (ref 22–32)
Calcium: 9.5 mg/dL (ref 8.9–10.3)
Chloride: 102 mmol/L (ref 98–111)
Creatinine, Ser: 0.86 mg/dL (ref 0.44–1.00)
GFR calc Af Amer: >60 mL/min (ref >60)
GFR calc non Af Amer: >60 mL/min (ref >60)
Glucose, Bld: 103 mg/dL — ABNORMAL HIGH (ref 70–99)
Potassium: 3.3 mmol/L — ABNORMAL LOW (ref 3.5–5.1)
Sodium: 138 mmol/L (ref 135–145)

## 2019-09-17 LAB — BRAIN NATRIURETIC PEPTIDE: B Natriuretic Peptide: 154.1 pg/mL — ABNORMAL HIGH (ref 0.0–100.0)

## 2019-09-17 LAB — TROPONIN I (HIGH SENSITIVITY): Troponin I (High Sensitivity): 26 ng/L — ABNORMAL HIGH (ref <18)

## 2019-09-17 LAB — CBG MONITORING, ED: Glucose-Capillary: 83 mg/dL (ref 70–99)

## 2019-09-17 MED ORDER — LORAZEPAM 1 MG PO TABS
1.0000 mg | ORAL_TABLET | Freq: Once | ORAL | Status: AC
  Administered 2019-09-17: 1 mg via ORAL
  Filled 2019-09-17: qty 1

## 2019-09-17 MED ORDER — ACETAMINOPHEN 500 MG PO TABS
1000.0000 mg | ORAL_TABLET | Freq: Once | ORAL | Status: AC
  Administered 2019-09-17: 1000 mg via ORAL
  Filled 2019-09-17: qty 2

## 2019-09-17 MED ORDER — NITROGLYCERIN 0.4 MG SL SUBL: 0.4000 mg | SUBLINGUAL_TABLET | SUBLINGUAL | Status: DC | PRN

## 2019-09-17 MED ORDER — LORAZEPAM 2 MG/ML IJ SOLN: 1.0000 mg | Freq: Once | INTRAMUSCULAR | Status: DC

## 2019-09-17 MED ORDER — NITROGLYCERIN IN D5W 200-5 MCG/ML-% IV SOLN
0.0000 ug/min | INTRAVENOUS | Status: DC
  Administered 2019-09-17: 5 ug/min via INTRAVENOUS
  Filled 2019-09-17: qty 250

## 2019-09-17 MED ORDER — ASPIRIN 81 MG PO CHEW
324.0000 mg | CHEWABLE_TABLET | Freq: Once | ORAL | Status: AC
  Administered 2019-09-17: 324 mg via ORAL

## 2019-09-17 NOTE — Discharge Instructions (Addendum)
We are redirecting you to the emergency room for rule out of a heart event given your EKG changes and severely elevated blood pressure. EMS will transport you there asap.

## 2019-09-17 NOTE — ED Triage Notes (Signed)
Patient has noticed breathing is different, sob, anxious.  .  Denies pain, but when she takes a deep breath, chest is sore.    Patient was seen in may after a fall and injury to arm.  Describes anxiety with the thought of being restricted by splint.  Once she realized she could remove it, she felt better. Patient relates haw she has felt today to how she felt when she thought she was restricted by cast.  Patient never misses work, but this is her 3rd day out of work  Denies cough, cold, runny nose, fever

## 2019-09-17 NOTE — ED Notes (Signed)
Patient is being discharged from the Urgent Care and sent to the Emergency Department via EMS . Per [place], [place], patient is in need of higher level of care due to EKG changes. Patient is aware and verbalizes understanding of plan of care.  Vitals:   09/17/19 1852  BP: (!) 182/92  Pulse: 71  Resp: 20  SpO2: 100%

## 2019-09-17 NOTE — ED Triage Notes (Signed)
Pt arrived via [facility] from [facility].Pt. complained of SOB and tightness in chest. Pt complains of tightness in chest for past 2 days.Pt denies pain and states its just tightness. EKG from urgent care shows possible first degree AV block and left bundle branch block.   Pt given 324 of asprin by PA before EMS arrival

## 2019-09-17 NOTE — ED Provider Notes (Addendum)
[place]   MRN: [id number] DOB: 1965-08-19  Subjective:   Pamela [name] is a 54 y.o. female presenting for 2 day shob, feeling anxious. Had similar episode at the beginning of the year. Had an OV with her PCP, had a Kenalog injection. Had positive d-dimer, borderline elevated bnp. A chest CT was ordered but not pursued as her sx resolved quickly after the Kenalog injection. Has not had COVID 19. No COVID vaccination. Works as a Education officer, [employer], does [employer].   No current facility-administered medications for this encounter.  Current Outpatient Medications:    amLODipine (NORVASC) 10 MG tablet, Take 1 tablet (10 mg total) by mouth daily., Disp: 90 tablet, Rfl: 2   Azilsartan-Chlorthalidone (EDARBYCLOR) 40-25 MG TABS, Take 1 tablet by mouth daily., Disp: 90 tablet, Rfl: 2   rosuvastatin (CRESTOR) 20 MG tablet, TAKE 1 TABLET IN THE P.M., Disp: 90 tablet, Rfl: 2   albuterol (VENTOLIN HFA) 108 (90 Base) MCG/ACT inhaler, Inhale 2 puffs into the lungs every 6 (six) hours as needed for wheezing or shortness of breath., Disp: 1 Inhaler, Rfl: 2   celecoxib (CELEBREX) 200 MG capsule, One capsule po qd prn joint pain, Disp: 30 capsule, Rfl: 2   loratadine (CLARITIN) 10 MG tablet, Take 1 tablet (10 mg total) by mouth daily. (Patient not taking: Reported on 05/13/2019), Disp: 90 tablet, Rfl: 2   Vitamin D, Ergocalciferol, (DRISDOL) 1.25 MG (50000 UNIT) CAPS capsule, Take 1 capsule (50,000 Units total) by mouth every 7 (seven) days., Disp: 24 capsule, Rfl: 0   Allergies  Allergen Reactions   Pollen Extract Shortness Of Breath   Latex Rash   Codeine    Shellfish Allergy    Betadine [Povidone Iodine] Rash    Past Medical History:  Diagnosis Date   Asthma    DUB (dysfunctional uterine bleeding) 2007   Elevated cholesterol    H/O menorrhagia 05/01/2006   History of ovarian cyst 2007   Hypertension 03/31/2005   Increased BMI 07/11/2006   Migraine    N&V (nausea and  vomiting)    Obesity 2007   Weight loss 07/11/2006     Past Surgical History:  Procedure Laterality Date   BREAST REDUCTION SURGERY  05/1991   CESAREAN SECTION     HYSTEROSCOPY  05/01/2006    Family History  Problem Relation Age of Onset   Diabetes Maternal Grandmother    Hypertension Mother    Hypertension Father    Heart attack Father     Social History   Tobacco Use   Smoking status: Never Smoker   Smokeless tobacco: Never Used  Vaping Use   Vaping Use: Never used  Substance Use Topics   Alcohol use: No   Drug use: Not Currently    Frequency: 2.0 times per week    ROS   Objective:   Vitals: BP (!) 182/92 (BP Location: Left Arm) Comment (BP Location): regular , forearm   Pulse 71    Resp 20    SpO2 100%   BP 184/94 on recheck.   BP 169/120 at 20:33  BP Readings from Last 3 Encounters:  09/17/19 (!) 182/92  07/21/19 (!) 174/99  07/02/19 136/90   Physical Exam Constitutional:      General: She is not in acute distress.    Appearance: Normal appearance. She is well-developed. She is obese. She is not ill-appearing, toxic-appearing or diaphoretic.  HENT:     Head: Normocephalic and atraumatic.     Nose: Nose normal.  Mouth/Throat:     Mouth: Mucous membranes are moist.  Eyes:     Extraocular Movements: Extraocular movements intact.     Pupils: Pupils are equal, round, and reactive to light.  Cardiovascular:     Rate and Rhythm: Normal rate and regular rhythm.     Pulses: Normal pulses.     Heart sounds: Normal heart sounds. No murmur heard.  No friction rub. No gallop.   Pulmonary:     Effort: Pulmonary effort is normal. No respiratory distress.     Breath sounds: Normal breath sounds. No stridor. No wheezing, rhonchi or rales.  Skin:    General: Skin is warm and dry.     Findings: No rash.  Neurological:     Mental Status: She is alert and oriented to person, place, and time.  Psychiatric:        Mood and Affect: Mood normal.          Behavior: Behavior normal.        Thought Content: Thought content normal.    DG Chest 2 View  Result Date: 09/17/2019 CLINICAL DATA:  54 year old female with shortness of breath for 2 days. EXAM: CHEST - 2 VIEW COMPARISON:  None. FINDINGS: Cardiomegaly. Other mediastinal contours are within normal limits. Visualized tracheal air column is within normal limits. Both lungs appear clear. No pneumothorax or pleural effusion. No acute osseous abnormality identified. Negative visible bowel gas pattern. IMPRESSION: Mild to moderate cardiomegaly with no acute pulmonary abnormality. Electronically Signed   By: Genevie Ann M.D.   On: 09/17/2019 19:38    ED ECG REPORT   Date: 09/17/2019  Rate: 87bpm  Rhythm: normal sinus rhythm  QRS Axis: left  Intervals: normal  ST/T Wave abnormalities: ST elevations anteriorly and ST elevations laterally  Conduction Disutrbances:first-degree A-V block   Narrative Interpretation: ST elevations in leads V2-V4, PVCs, 1st degree AV block, left axis deviation. Sinus rhythm at 87bpm. EKG starkly different from previous ekg from 05/13/2019.  Old EKG Reviewed: changes noted  I have personally reviewed the EKG tracing and agree with the computerized printout as noted.   Assessment and Plan :   PDMP not reviewed this encounter.  1. Shortness of breath   2. Uncontrolled hypertension   3. Chest pressure     Patient has significant ekg changes with possible antero-lateral infarct. Patient given 324mg  chewable aspirin. Redirected to the ER via EMS for cardiac work up.  Case reported out to EMS emphasizing stark changes from previous EKG in March showing normal sinus rhythm without any AV block, PVCs or ST elevations.    Jaynee Eagles, PA-C 09/17/19 2039

## 2019-09-17 NOTE — ED Notes (Signed)
[facility] called at this time per [place], [place]

## 2019-09-17 NOTE — ED Provider Notes (Signed)
[facility] EMERGENCY DEPARTMENT Provider Note   CSN: [id number] Arrival date & time: 09/17/19  2100     History Chief Complaint  Patient presents with  . Shortness of Breath  . Chest Pain    Pamela [name] is a 54 y.o. female with past medical history of elevated BMI, hyperlipidemia, hypertension presents to the ED from urgent care for evaluation of abnormal EKG.  Patient reports feeling anxious, restless on Monday.  Yesterday evening she had central chest heaviness that has been constant since, 8/10.  This is worse when she gets up and moves around and exerts herself.  Has noticed associated shortness of breath and feeling of anxiety with it.  Admits that she is not taking her blood pressure medicines as prescribed because they make her urinate too much.  Reports having similar episodes of shortness of breath and chest discomfort in March of this year.  She had abnormal lab test including D-dimer but did not follow-up with CT scan that was ordered because she states her symptoms improved after some steroids and breathing inhaler.  Her mother had a "slight" heart attack in her 73s.  Denies any personal history of CAD.  No tobacco use.  No Covid vaccine.  Denies fever,, orthopnea, extremity swelling, calf pain.  No associated nausea, vomiting, diaphoresis, neck or arm pain.  No history of PE or DVT.  HPI     Past Medical History:  Diagnosis Date  . Asthma   . DUB (dysfunctional uterine bleeding) 2007  . Elevated cholesterol   . H/O menorrhagia 05/01/2006  . History of ovarian cyst 2007  . Hypertension 03/31/2005  . Increased BMI 07/11/2006  . Migraine   . N&V (nausea and vomiting)   . Obesity 2007  . Weight loss 07/11/2006    Patient Active Problem List   Diagnosis Date Noted  . Primary osteoarthritis of right hip 08/06/2018  . Seasonal allergies 06/12/2018  . Fibrocystic disease of breast 05/27/2018  . Obstructive sleep apnea syndrome, moderate 10/27/2013  .  Shift work sleep disorder 10/27/2013  . Psychic factors associated with diseases classified elsewhere 08/07/2013  . High cholesterol 07/21/2013  . HTN (hypertension) 07/21/2013  . Snoring 07/21/2013  . IUD contraception-inserted 10/01/11 10/01/2011  . Obesity 09/03/2011    Past Surgical History:  Procedure Laterality Date  . BREAST REDUCTION SURGERY  05/1991  . CESAREAN SECTION    . HYSTEROSCOPY  05/01/2006     OB History    Gravida  4   Para  2   Term  1   Preterm  1   AB  2   Living  2     SAB  1   TAB      Ectopic      Multiple      Live Births  2           Family History  Problem Relation Age of Onset  . Diabetes Maternal Grandmother   . Hypertension Mother   . Hypertension Father   . Heart attack Father     Social History   Tobacco Use  . Smoking status: Never Smoker  . Smokeless tobacco: Never Used  Vaping Use  . Vaping Use: Never used  Substance Use Topics  . Alcohol use: No  . Drug use: Not Currently    Frequency: 2.0 times per week    Home Medications Prior to Admission medications   Medication Sig Start Date End Date Taking? Authorizing Provider  albuterol (  VENTOLIN HFA) 108 (90 Base) MCG/ACT inhaler Inhale 2 puffs into the lungs every 6 (six) hours as needed for wheezing or shortness of breath. 06/25/18  Yes [name], MD  amLODipine (NORVASC) 10 MG tablet Take 1 tablet (10 mg total) by mouth daily. 06/17/19  Yes [name], MD  Azilsartan-Chlorthalidone (EDARBYCLOR) 40-25 MG TABS Take 1 tablet by mouth daily. 06/17/19  Yes [name], MD  rosuvastatin (CRESTOR) 20 MG tablet TAKE 1 TABLET IN THE P.M. 06/17/19  Yes [name], MD  Vitamin D, Ergocalciferol, (DRISDOL) 1.25 MG (50000 UNIT) CAPS capsule Take 1 capsule (50,000 Units total) by mouth every 7 (seven) days. 06/17/19  Yes [name], MD  celecoxib (CELEBREX) 200 MG capsule One capsule po qd prn joint pain Patient not taking: Reported on 09/17/2019 06/25/18    [name], MD  loratadine (CLARITIN) 10 MG tablet Take 1 tablet (10 mg total) by mouth daily. Patient not taking: Reported on 05/13/2019 06/12/18 06/12/19  [name], MD    Allergies    Pollen extract, Latex, Codeine, Shellfish allergy, and Betadine [povidone iodine]  Review of Systems   Review of Systems  Respiratory: Positive for shortness of breath.   Cardiovascular: Positive for chest pain (pressure).  Psychiatric/Behavioral: The patient is nervous/anxious.   All other systems reviewed and are negative.   Physical Exam Updated Vital Signs BP (!) 165/77 (BP Location: Right Arm)   Pulse 88   Temp 98.7 F (37.1 C) (Oral)   Resp 11   Ht 5\' 9"  (1.753 m)   Wt (!) 145.2 kg   SpO2 97%   BMI 47.26 kg/m   Physical Exam Vitals and nursing note reviewed.  Constitutional:      General: She is not in acute distress.    Appearance: She is well-developed.     Comments: NAD. Obese   HENT:     Head: Normocephalic and atraumatic.     Right Ear: External ear normal.     Left Ear: External ear normal.     Nose: Nose normal.  Eyes:     General: No scleral icterus.    Conjunctiva/sclera: Conjunctivae normal.  Cardiovascular:     Rate and Rhythm: Normal rate and regular rhythm.     Heart sounds: Normal heart sounds. No murmur heard.      Comments: No peripheral edema.  1+ radial and DP pulses bilaterally.  No calf tenderness. Pulmonary:     Effort: Pulmonary effort is normal.     Breath sounds: No wheezing.     Comments: Diminished breath sounds in lower lobes anterior/posteriorly, difficult exam due to body habitus.  No rales.  Speaking in full sentences. Musculoskeletal:        General: No deformity. Normal range of motion.     Cervical back: Normal range of motion and neck supple.  Skin:    General: Skin is warm and dry.     Capillary Refill: Capillary refill takes less than 2 seconds.  Neurological:     Mental Status: She is alert and oriented to person, place, and  time.  Psychiatric:        Behavior: Behavior normal.        Thought Content: Thought content normal.        Judgment: Judgment normal.     ED Results / Procedures / Treatments   Labs (all labs ordered are listed, but only abnormal results are displayed) Labs Reviewed  BASIC METABOLIC PANEL - Abnormal; Notable for the following components:  Result Value   Potassium 3.3 (*)    Glucose, Bld 103 (*)    All other components within normal limits  CBC - Abnormal; Notable for the following components:   Hemoglobin 11.9 (*)    MCH 25.7 (*)    All other components within normal limits  BRAIN NATRIURETIC PEPTIDE - Abnormal; Notable for the following components:   B Natriuretic Peptide 154.1 (*)    All other components within normal limits  TROPONIN I (HIGH SENSITIVITY) - Abnormal; Notable for the following components:   Troponin I (High Sensitivity) 26 (*)    All other components within normal limits  SARS CORONAVIRUS 2 BY RT PCR (HOSPITAL ORDER, [facility])  CBG MONITORING, ED  TROPONIN I (HIGH SENSITIVITY)    EKG EKG Interpretation  Date/Time:  Thursday September 17 2019 21:15:30 EDT Ventricular Rate:  89 PR Interval:    QRS Duration: 160 QT Interval:  416 QTC Calculation: 507 R Axis:   -7 Text Interpretation: Sinus rhythm Atrial premature complex Right atrial enlargement Left bundle branch block Abnormal ECG Confirmed by Carmin Muskrat [phone]) on 09/17/2019 9:27:29 PM   Radiology DG Chest 2 View  Result Date: 09/17/2019 CLINICAL DATA:  54 year old female with shortness of breath for 2 days. EXAM: CHEST - 2 VIEW COMPARISON:  None. FINDINGS: Cardiomegaly. Other mediastinal contours are within normal limits. Visualized tracheal air column is within normal limits. Both lungs appear clear. No pneumothorax or pleural effusion. No acute osseous abnormality identified. Negative visible bowel gas pattern. IMPRESSION: Mild to moderate cardiomegaly with no  acute pulmonary abnormality. Electronically Signed   By: Genevie Ann M.D.   On: 09/17/2019 19:38   DG Chest Port 1 View  Result Date: 09/17/2019 CLINICAL DATA:  Chest pain, dyspnea EXAM: PORTABLE CHEST 1 VIEW COMPARISON:  09/17/2019 at 7:27 p.m. FINDINGS: Lungs are clear. No pneumothorax or pleural effusion. Mild cardiomegaly is again seen, stable since prior examination. Pulmonary vascularity is normal. No acute bone abnormality. IMPRESSION: Stable examination.  Mild cardiomegaly. Electronically Signed   By: Fidela Salisbury MD   On: 09/17/2019 22:40    Procedures .Critical Care Performed by: Kinnie Feil, PA-C Authorized by: Kinnie Feil, PA-C   Critical care provider statement:    Critical care time (minutes):  45   Critical care was necessary to treat or prevent imminent or life-threatening deterioration of the following conditions:  Cardiac failure   Critical care was time spent personally by me on the following activities:  Discussions with consultants, evaluation of patient's response to treatment, examination of patient, ordering and performing treatments and interventions, ordering and review of laboratory studies, ordering and review of radiographic studies, pulse oximetry, re-evaluation of patient's condition, obtaining history from patient or surrogate and review of old charts   I assumed direction of critical care for this patient from another provider in my specialty: no     (including critical care time)  Medications Ordered in ED Medications  nitroGLYCERIN 50 mg in dextrose 5 % 250 mL (0.2 mg/mL) infusion (45 mcg/min Intravenous Rate/Dose Change 09/17/19 2322)  acetaminophen (TYLENOL) tablet 1,000 mg (has no administration in time range)  LORazepam (ATIVAN) tablet 1 mg (has no administration in time range)    ED Course  I have reviewed the triage vital signs and the nursing notes.  Pertinent labs & imaging results that were available during my care of the patient  were reviewed by me and considered in my medical decision making (see chart  for details).  Clinical Course as of Sep 16 2333  Thu Sep 17, 2019  2137 Sinus rhythm Atrial premature complex Right atrial enlargement Left bundle branch block Abnormal ECG Confirmed by Carmin Muskrat [phone]) on 09/17/2019 9:27:29 PM  EKG 12-Lead [CG]  2205 Troponin I (High Sensitivity)(!): 26 [CG]  2305 IMPRESSION: Stable examination. Mild cardiomegaly.  DG Chest Port 1 View [CG]  2305 BP(!): 177/82 [CG]  2305 B Natriuretic Peptide(!): 154.1 [CG]    Clinical Course User Index [CG] Kinnie Feil, PA-C   MDM Rules/Calculators/A&P                          EKG reviewed with EDP Vanita Panda upon patient arrival.  She has new left bundle branch block, PACs and right atrial enlargement.  This is very different from her last EKG March 2021.  I obtained additional history from triage, nursing notes and review of medical chart.  Previous medical records available, nursing notes reviewed to obtain more history and assist with MDM  In summary, she had elevated D-dimer and borderline BNP in March 2021.  She did not follow-up with CT of the chest that was ordered.  Chief complain involves an extensive number of treatment options and is a complaint that carries with it a high risk of complications and morbidity and mortality.    The differential diagnosis includes hypertensive for emergency, ACS, unstable angina.  No history of heart failure or overt signs of hypervolemia on exam, respiratory distress, CHF is less likely.  No infectious symptoms and pneumonia, Covid less likely.  I have ordered lab work including troponin x2, BNP.  I have ordered chest x-ray, EKG.  Continuous cardiac monitoring and pulse ox.  She received aspirin on route by EMS.  We will start nitro drip for blood pressure control and chest pain.  2314: ER work-up personally reviewed and interpreted.  BNP is 154, chest x-ray without acute  cardiopulmonary abnormalities.  Troponin is 26.  K is 3.3.  I reevaluated patient and she has mild improvement in chest pressure.  Blood pressure has been improved significantly with nitro drip.  Remains hemodynamically stable.  Shared with EDP.  Will admit to medicine for likely hypertensive emergency.  Final Clinical Impression(s) / ED Diagnoses Final diagnoses:  Elevated troponin    Rx / DC Orders ED Discharge Orders    None       Arlean Hopping 09/17/19 2335    Carmin Muskrat, MD 09/19/19 [phone]

## 2019-09-18 ENCOUNTER — Observation Stay (HOSPITAL_BASED_OUTPATIENT_CLINIC_OR_DEPARTMENT_OTHER): Payer: 59

## 2019-09-18 ENCOUNTER — Encounter (HOSPITAL_COMMUNITY): Payer: Self-pay | Admitting: Internal Medicine

## 2019-09-18 DIAGNOSIS — R0602 Shortness of breath: Secondary | ICD-10-CM | POA: Diagnosis not present

## 2019-09-18 DIAGNOSIS — R778 Other specified abnormalities of plasma proteins: Secondary | ICD-10-CM | POA: Diagnosis not present

## 2019-09-18 DIAGNOSIS — Z6841 Body Mass Index (BMI) 40.0 and over, adult: Secondary | ICD-10-CM

## 2019-09-18 DIAGNOSIS — E782 Mixed hyperlipidemia: Secondary | ICD-10-CM | POA: Diagnosis present

## 2019-09-18 DIAGNOSIS — I161 Hypertensive emergency: Secondary | ICD-10-CM | POA: Diagnosis not present

## 2019-09-18 DIAGNOSIS — R079 Chest pain, unspecified: Secondary | ICD-10-CM | POA: Diagnosis not present

## 2019-09-18 LAB — CBC WITH DIFFERENTIAL/PLATELET
Abs Immature Granulocytes: 0.03 10*3/uL (ref 0.00–0.07)
Basophils Absolute: 0 10*3/uL (ref 0.0–0.1)
Basophils Relative: 1 %
Eosinophils Absolute: 0.1 10*3/uL (ref 0.0–0.5)
Eosinophils Relative: 1 %
HCT: 38.6 % (ref 36.0–46.0)
Hemoglobin: 11.9 g/dL — ABNORMAL LOW (ref 12.0–15.0)
Immature Granulocytes: 0 %
Lymphocytes Relative: 15 %
Lymphs Abs: 1.2 10*3/uL (ref 0.7–4.0)
MCH: 26.5 pg (ref 26.0–34.0)
MCHC: 30.8 g/dL (ref 30.0–36.0)
MCV: 86 fL (ref 80.0–100.0)
Monocytes Absolute: 0.7 10*3/uL (ref 0.1–1.0)
Monocytes Relative: 9 %
Neutro Abs: 5.7 10*3/uL (ref 1.7–7.7)
Neutrophils Relative %: 74 %
Platelets: 219 10*3/uL (ref 150–400)
RBC: 4.49 MIL/uL (ref 3.87–5.11)
RDW: 14.9 % (ref 11.5–15.5)
WBC: 7.8 10*3/uL (ref 4.0–10.5)
nRBC: 0 % (ref 0.0–0.2)

## 2019-09-18 LAB — COMPREHENSIVE METABOLIC PANEL
ALT: 15 U/L (ref 0–44)
AST: 17 U/L (ref 15–41)
Albumin: 3.8 g/dL (ref 3.5–5.0)
Alkaline Phosphatase: 58 U/L (ref 38–126)
Anion gap: 11 (ref 5–15)
BUN: 7 mg/dL (ref 6–20)
CO2: 26 mmol/L (ref 22–32)
Calcium: 9.2 mg/dL (ref 8.9–10.3)
Chloride: 99 mmol/L (ref 98–111)
Creatinine, Ser: 0.85 mg/dL (ref 0.44–1.00)
GFR calc Af Amer: >60 mL/min (ref >60)
GFR calc non Af Amer: >60 mL/min (ref >60)
Glucose, Bld: 107 mg/dL — ABNORMAL HIGH (ref 70–99)
Potassium: 3.7 mmol/L (ref 3.5–5.1)
Sodium: 136 mmol/L (ref 135–145)
Total Bilirubin: 1.5 mg/dL — ABNORMAL HIGH (ref 0.3–1.2)
Total Protein: 6.8 g/dL (ref 6.5–8.1)

## 2019-09-18 LAB — TROPONIN I (HIGH SENSITIVITY)
Troponin I (High Sensitivity): 38 ng/L — ABNORMAL HIGH (ref <18)
Troponin I (High Sensitivity): 40 ng/L — ABNORMAL HIGH (ref <18)

## 2019-09-18 LAB — LIPID PANEL
Cholesterol: 236 mg/dL — ABNORMAL HIGH (ref 0–200)
HDL: 47 mg/dL (ref >40)
LDL Cholesterol: 176 mg/dL — ABNORMAL HIGH (ref 0–99)
Total CHOL/HDL Ratio: 5 RATIO
Triglycerides: 67 mg/dL (ref <150)
VLDL: 13 mg/dL (ref 0–40)

## 2019-09-18 LAB — MAGNESIUM: Magnesium: 1.8 mg/dL (ref 1.7–2.4)

## 2019-09-18 LAB — ECHOCARDIOGRAM COMPLETE
Area-P 1/2: 3.85 cm2
Height: 69 in
S' Lateral: 3.7 cm
Weight: 5120 oz

## 2019-09-18 LAB — HIV ANTIBODY (ROUTINE TESTING W REFLEX): HIV Screen 4th Generation wRfx: NONREACTIVE

## 2019-09-18 LAB — SARS CORONAVIRUS 2 (TAT 6-24 HRS): SARS Coronavirus 2: NEGATIVE

## 2019-09-18 MED ORDER — EDARBYCLOR 40-25 MG PO TABS: 1.0000 | ORAL_TABLET | Freq: Every day | ORAL | 0 refills | Status: DC

## 2019-09-18 MED ORDER — ENOXAPARIN SODIUM 80 MG/0.8ML ~~LOC~~ SOLN
70.0000 mg | SUBCUTANEOUS | Status: DC
  Filled 2019-09-18: qty 0.7

## 2019-09-18 MED ORDER — CHLORTHALIDONE 25 MG PO TABS
25.0000 mg | ORAL_TABLET | Freq: Every day | ORAL | Status: DC
  Administered 2019-09-18: 25 mg via ORAL
  Filled 2019-09-18 (×2): qty 1

## 2019-09-18 MED ORDER — ONDANSETRON HCL 4 MG PO TABS: 4.0000 mg | ORAL_TABLET | Freq: Four times a day (QID) | ORAL | Status: DC | PRN

## 2019-09-18 MED ORDER — HYDRALAZINE HCL 20 MG/ML IJ SOLN: 10.0000 mg | Freq: Four times a day (QID) | INTRAMUSCULAR | Status: DC | PRN

## 2019-09-18 MED ORDER — AZILSARTAN-CHLORTHALIDONE 40-25 MG PO TABS: 1.0000 | ORAL_TABLET | Freq: Every day | ORAL | Status: DC

## 2019-09-18 MED ORDER — ACETAMINOPHEN 325 MG PO TABS: 650.0000 mg | ORAL_TABLET | Freq: Four times a day (QID) | ORAL | Status: DC | PRN

## 2019-09-18 MED ORDER — ALBUTEROL SULFATE (2.5 MG/3ML) 0.083% IN NEBU: 2.5000 mg | INHALATION_SOLUTION | Freq: Four times a day (QID) | RESPIRATORY_TRACT | Status: DC | PRN

## 2019-09-18 MED ORDER — ROSUVASTATIN CALCIUM 20 MG PO TABS: 20.0000 mg | ORAL_TABLET | Freq: Every day | ORAL | Status: DC

## 2019-09-18 MED ORDER — POLYETHYLENE GLYCOL 3350 17 G PO PACK: 17.0000 g | PACK | Freq: Every day | ORAL | Status: DC | PRN

## 2019-09-18 MED ORDER — ONDANSETRON HCL 4 MG/2ML IJ SOLN: 4.0000 mg | Freq: Four times a day (QID) | INTRAMUSCULAR | Status: DC | PRN

## 2019-09-18 MED ORDER — ASPIRIN EC 81 MG PO TBEC
81.0000 mg | DELAYED_RELEASE_TABLET | Freq: Every day | ORAL | Status: DC
  Administered 2019-09-18: 81 mg via ORAL
  Filled 2019-09-18: qty 1

## 2019-09-18 MED ORDER — ACETAMINOPHEN 650 MG RE SUPP: 650.0000 mg | Freq: Four times a day (QID) | RECTAL | Status: DC | PRN

## 2019-09-18 MED ORDER — AMLODIPINE BESYLATE 5 MG PO TABS
10.0000 mg | ORAL_TABLET | Freq: Every day | ORAL | Status: DC
  Administered 2019-09-18: 10 mg via ORAL
  Filled 2019-09-18: qty 2

## 2019-09-18 MED ORDER — IRBESARTAN 300 MG PO TABS
300.0000 mg | ORAL_TABLET | Freq: Every day | ORAL | Status: DC
  Administered 2019-09-18: 300 mg via ORAL
  Filled 2019-09-18 (×2): qty 1

## 2019-09-18 MED ORDER — ASPIRIN EC 81 MG PO TBEC: 81.0000 mg | DELAYED_RELEASE_TABLET | Freq: Every day | ORAL | 11 refills | Status: AC

## 2019-09-18 NOTE — H&P (Signed)
History and Physical    ALYSON KI TIW:[id number] DOB: 07-19-1965 DOA: 09/17/2019  PCP: [name], MD  Patient coming from: home   Chief Complaint:  Chief Complaint  Patient presents with  . Shortness of Breath  . Chest Pain     HPI:    54 year old female with past medical history of hypertension, hyperlipidemia, obesity, asthma who presents to [facility] emergency department complaints of shortness of breath.  Patient explains that for the past 2 to 3 days she has been feeling more more short of breath.  Patient describes the shortness of breath as occurring with exertion.  Shortness of breath is initially mild in intensity but is progressively becoming more and more severe.  Over the span of time patient is also been complaining of intermittent bouts of mild chest pressure.  Patient describes this chest pressure as midsternal in location and nonradiating.  Bouts of chest pressure can occur either with exertion or at rest.  Patient denies any associated cough, fevers, sick contacts.  Patient denies any leg swelling, paroxysmal nocturnal dyspnea or pillow orthopnea.  Of note, patient states that she has been extremely compliant with her antihypertensive regimen.  She states that sometimes for over a week at a time she takes no antihypertensive medications.  She states that she does not want to frequently use the bathroom while at work while on her regimen of chlorthalidone.  Patient symptoms continue to worsen until she eventually presented to [facility] emergency department for evaluation.  Upon evaluation in the emergency department patient was found to have markedly elevated blood pressures as high as 226/106.  Patient was initiated on nitroglycerin infusion.  The hospitalist group was then called to assess patient for admission the hospital.   Review of Systems: A 10-system review of systems has been performed and all systems are negative with the  exception of what is listed in the HPI.    Past Medical History:  Diagnosis Date  . Asthma   . DUB (dysfunctional uterine bleeding) 2007  . Elevated cholesterol   . H/O menorrhagia 05/01/2006  . History of ovarian cyst 2007  . Hypertension 03/31/2005  . Increased BMI 07/11/2006  . Migraine   . N&V (nausea and vomiting)   . Obesity 2007  . Obstructive sleep apnea syndrome, moderate 10/27/2013  . Weight loss 07/11/2006    Past Surgical History:  Procedure Laterality Date  . BREAST REDUCTION SURGERY  05/1991  . CESAREAN SECTION    . HYSTEROSCOPY  05/01/2006     reports that she has never smoked. She has never used smokeless tobacco. She reports previous drug use. Frequency: 2.00 times per week. She reports that she does not drink alcohol.  Allergies  Allergen Reactions  . Pollen Extract Shortness Of Breath  . Latex Rash  . Codeine Hives  . Shellfish Allergy Hives  . Betadine [Povidone Iodine] Rash    Family History  Problem Relation Age of Onset  . Diabetes Maternal Grandmother   . Hypertension Mother   . Hypertension Father   . Heart attack Father      Prior to Admission medications   Medication Sig Start Date End Date Taking? Authorizing Provider  albuterol (VENTOLIN HFA) 108 (90 Base) MCG/ACT inhaler Inhale 2 puffs into the lungs every 6 (six) hours as needed for wheezing or shortness of breath. 06/25/18  Yes [name], MD  amLODipine (NORVASC) 10 MG tablet Take 1 tablet (10 mg total) by mouth daily. 06/17/19  Yes  [name], MD  Azilsartan-Chlorthalidone (EDARBYCLOR) 40-25 MG TABS Take 1 tablet by mouth daily. 06/17/19  Yes [name], MD  rosuvastatin (CRESTOR) 20 MG tablet TAKE 1 TABLET IN THE P.M. 06/17/19  Yes [name], MD  Vitamin D, Ergocalciferol, (DRISDOL) 1.25 MG (50000 UNIT) CAPS capsule Take 1 capsule (50,000 Units total) by mouth every 7 (seven) days. 06/17/19  Yes [name], MD  celecoxib (CELEBREX) 200 MG capsule One capsule po qd prn  joint pain Patient not taking: Reported on 09/17/2019 06/25/18   [name], MD  loratadine (CLARITIN) 10 MG tablet Take 1 tablet (10 mg total) by mouth daily. Patient not taking: Reported on 05/13/2019 06/12/18 06/12/19  [name], MD    Physical Exam: Vitals:   09/18/19 0108 09/18/19 0117 09/18/19 0141 09/18/19 0145  BP: (!) 92/56 (!) 91/40 (!) 115/57 (!) 97/46  Pulse: 65 77 65 65  Resp: 13 17 19  (!) 22  Temp:      TempSrc:  Oral    SpO2: 96% 98% 96% 98%  Weight:      Height:        Constitutional: Acute alert and oriented x3, no associated distress.   Skin: no rashes, no lesions, good skin turgor noted. Eyes: Pupils are equally reactive to light.  No evidence of scleral icterus or conjunctival pallor.  ENMT: Moist mucous membranes noted.  Posterior pharynx clear of any exudate or lesions.   Neck: normal, supple, no masses, no thyromegaly.  No evidence of jugular venous distension.   Respiratory: clear to auscultation bilaterally, no wheezing, no crackles. Normal respiratory effort. No accessory muscle use.  Cardiovascular: Regular rate and rhythm, no murmurs / rubs / gallops. No extremity edema. 2+ pedal pulses. No carotid bruits.  Chest:   Nontender without crepitus or deformity.   Back:   Nontender without crepitus or deformity. Abdomen: Abdomen is soft and nontender.  No evidence of intra-abdominal masses.  Positive bowel sounds noted in all quadrants.   Musculoskeletal: No joint deformity upper and lower extremities. Good ROM, no contractures. Normal muscle tone.  Neurologic: CN 2-12 grossly intact. Sensation intact, strength noted to be 5 out of 5 in all 4 extremities.  Patient is following all commands.  Patient is responsive to verbal stimuli.   Psychiatric: Patient presents as a normal mood with appropriate affect.  Patient seems to possess insight as to theircurrent situation.     Labs on Admission: I have personally reviewed following labs and imaging studies -    CBC: Recent Labs  Lab 09/17/19 2120  WBC 8.1  HGB 11.9*  HCT 38.5  MCV 83.2  PLT 010   Basic Metabolic Panel: Recent Labs  Lab 09/17/19 2120  NA 138  K 3.3*  CL 102  CO2 25  GLUCOSE 103*  BUN 8  CREATININE 0.86  CALCIUM 9.5   GFR: Estimated Creatinine Clearance: 116.8 mL/min (by C-G formula based on SCr of 0.86 mg/dL). Liver Function Tests: No results for input(s): AST, ALT, ALKPHOS, BILITOT, PROT, ALBUMIN in the last 168 hours. No results for input(s): LIPASE, AMYLASE in the last 168 hours. No results for input(s): AMMONIA in the last 168 hours. Coagulation Profile: No results for input(s): INR, PROTIME in the last 168 hours. Cardiac Enzymes: No results for input(s): CKTOTAL, CKMB, CKMBINDEX, TROPONINI in the last 168 hours. BNP (last 3 results) No results for input(s): PROBNP in the last 8760 hours. HbA1C: No results for input(s): HGBA1C in the last 72 hours. CBG: Recent Labs  Lab 09/17/19 2118  GLUCAP 83   Lipid Profile: No results for input(s): CHOL, HDL, LDLCALC, TRIG, CHOLHDL, LDLDIRECT in the last 72 hours. Thyroid Function Tests: No results for input(s): TSH, T4TOTAL, FREET4, T3FREE, THYROIDAB in the last 72 hours. Anemia Panel: No results for input(s): VITAMINB12, FOLATE, FERRITIN, TIBC, IRON, RETICCTPCT in the last 72 hours. Urine analysis:    Component Value Date/Time   COLORURINE YELLOW 05/11/2010 1640   APPEARANCEUR CLEAR 05/11/2010 1640   LABSPEC 1.016 05/11/2010 1640   PHURINE 6.0 05/11/2010 1640   GLUCOSEU NEGATIVE 05/11/2010 1640   HGBUR NEGATIVE 05/11/2010 1640   BILIRUBINUR Negative 06/17/2019 1007   KETONESUR NEGATIVE 05/11/2010 1640   PROTEINUR Positive (A) 06/17/2019 1007   PROTEINUR NEGATIVE 05/11/2010 1640   UROBILINOGEN 1.0 06/17/2019 1007   UROBILINOGEN 0.2 05/11/2010 1640   NITRITE Negative 06/17/2019 1007   NITRITE NEGATIVE 05/11/2010 1640   LEUKOCYTESUR Small (1+) (A) 06/17/2019 1007    Radiological Exams on  Admission - Personally Reviewed: DG Chest 2 View  Result Date: 09/17/2019 CLINICAL DATA:  54 year old female with shortness of breath for 2 days. EXAM: CHEST - 2 VIEW COMPARISON:  None. FINDINGS: Cardiomegaly. Other mediastinal contours are within normal limits. Visualized tracheal air column is within normal limits. Both lungs appear clear. No pneumothorax or pleural effusion. No acute osseous abnormality identified. Negative visible bowel gas pattern. IMPRESSION: Mild to moderate cardiomegaly with no acute pulmonary abnormality. Electronically Signed   By: Genevie Ann M.D.   On: 09/17/2019 19:38   DG Chest Port 1 View  Result Date: 09/17/2019 CLINICAL DATA:  Chest pain, dyspnea EXAM: PORTABLE CHEST 1 VIEW COMPARISON:  09/17/2019 at 7:27 p.m. FINDINGS: Lungs are clear. No pneumothorax or pleural effusion. Mild cardiomegaly is again seen, stable since prior examination. Pulmonary vascularity is normal. No acute bone abnormality. IMPRESSION: Stable examination.  Mild cardiomegaly. Electronically Signed   By: Fidela Salisbury MD   On: 09/17/2019 22:40    EKG: Personally reviewed.  Rhythm is sinus rhythm with heart rate of 89 bpm.  Left bundle branch block.  Assessment/Plan Principal Problem:   Hypertensive emergency   Patient presenting with complaints of shortness of breath and intermittent bouts of chest discomfort.  Initial evaluation revealing patient with new onset left bundle branch block and slightly elevated troponin concerning for hypertensive urgency and early hypertensive emergency.  Patient was already initiated on a nitroglycerin infusion by the emergency department staff.  Unfortunately, with this infusion the patient is becoming hypotensive and therefore I am discontinuing this therapy for now.  Target blood pressures should be approximately 25% reduction from her initial presenting blood pressure of 226/106.  Considering the large swings in blood pressure will place patient in  stepdown unit for now, patient can be quickly downgraded to medical telemetry as she clinically improves.  We will place patient on amlodipine with parameters as well as as needed intravenous hydralazine for markedly elevated blood pressures  Will slowly titrate on additional oral antihypertensives later in the hospitalization as blood pressure tolerates  Continue to cycle cardiac enzymes  Obtaining echocardiogram  To monitor patient on telemetry.  Active Problems:   Elevated troponin level not due myocardial infarction   Slightly elevated troponin in this patient with atypical chest discomfort  Chest discomfort is nonradiating and occurring both with and without exertion.  I feel the chest discomfort is most likely related to the markedly elevated blood pressures.  Patient is unlikely to be suffering from plaque rupture.  Place patient on  daily aspirin therapy  Cycling cardiac enzymes  Monitoring patient on telemetry  Patient would benefit from noninvasive ischemic assessment either towards the end of this hospitalization or in the outpatient setting.    Class 3 severe obesity due to excess calories with serious comorbidity and body mass index (BMI) of 45.0 to 49.9 in adult [facility])   Once patient clinically improves will counsel patient on caloric restriction and regular physical activity    Mixed hyperlipidemia  Obtaining lipid panel  Continue home regimen of statin therapy   Code Status:  Full code Family Communication: Deferred  Status is: Observation  The patient remains OBS appropriate and will d/c before 2 midnights.  Dispo: The patient is from: Home              Anticipated d/c is to: Home              Anticipated d/c date is: 2 days              Patient currently is not medically stable to d/c.        Vernelle Emerald MD [facility] Pager [phone]  If 7PM-7AM, please contact night-coverage [email or web address] Use universal [place]  password for that web site. If you do not have the password, please call the hospital operator.  09/18/2019, 1:54 AM

## 2019-09-18 NOTE — ED Notes (Signed)
Patient transported to echo ?

## 2019-09-18 NOTE — Progress Notes (Signed)
  Echocardiogram 2D Echocardiogram has been performed.  Pamela [name] 09/18/2019, 8:50 AM

## 2019-09-18 NOTE — ED Notes (Signed)
Dr. Cyd Silence notified of systolic levels. Monitoring Pt closely. Nitro stopped

## 2019-09-18 NOTE — ED Notes (Signed)
Patient returned from echo

## 2019-09-18 NOTE — ED Notes (Signed)
Lunch Tray Ordered @ 1139. 

## 2019-09-18 NOTE — Discharge Summary (Signed)
Pamela [name], is a 54 y.o. female  DOB 1965/07/21  MRN [id number].  Admission date:  09/17/2019  Admitting Physician  Vernelle Emerald, MD  Discharge Date:  09/18/2019   Primary MD  [name], MD  Recommendations for primary care physician for things to follow:  -Please check CBC, BMP during next visit. -Please continue counseling about medication compliance, and low-salt diet.  Consider adjusting antihypertensive regimen if blood pressure remains uncontrolled and she is compliant with her medication regimen, will follow up on lipid panel once patient has been compliant with her Crestor. -Patient to follow with cardiology as an outpatient, see [facility] to arrange for outpatient follow-up, given finding of left bundle branch in EKG.   Admission Diagnosis  Hypertensive emergency [I16.1]   Discharge Diagnosis  Hypertensive emergency [I16.1]    Principal Problem:   Hypertensive emergency Active Problems:   Class 3 severe obesity due to excess calories with serious comorbidity and body mass index (BMI) of 45.0 to 49.9 in adult (HCC)   Elevated troponin level not due myocardial infarction   Mixed hyperlipidemia      Past Medical History:  Diagnosis Date  . Asthma   . DUB (dysfunctional uterine bleeding) 2007  . Elevated cholesterol   . H/O menorrhagia 05/01/2006  . History of ovarian cyst 2007  . Hypertension 03/31/2005  . Increased BMI 07/11/2006  . Migraine   . N&V (nausea and vomiting)   . Obesity 2007  . Obstructive sleep apnea syndrome, moderate 10/27/2013  . Weight loss 07/11/2006    Past Surgical History:  Procedure Laterality Date  . BREAST REDUCTION SURGERY  05/1991  . CESAREAN SECTION    . HYSTEROSCOPY  05/01/2006       History of present illness and  Hospital Course:     Kindly see H&P for history of present illness and admission details, please review complete Labs, Consult  reports and Test reports for all details in brief  HPI  from the history and physical done on the day of admission 09/18/2019  HPI:    54 year old female with past medical history of hypertension, hyperlipidemia, obesity, asthma who presents to [facility] emergency department complaints of shortness of breath.  Patient explains that for the past 2 to 3 days she has been feeling more more short of breath.  Patient describes the shortness of breath as occurring with exertion.  Shortness of breath is initially mild in intensity but is progressively becoming more and more severe.  Over the span of time patient is also been complaining of intermittent bouts of mild chest pressure.  Patient describes this chest pressure as midsternal in location and nonradiating.  Bouts of chest pressure can occur either with exertion or at rest.  Patient denies any associated cough, fevers, sick contacts.  Patient denies any leg swelling, paroxysmal nocturnal dyspnea or pillow orthopnea.  Of note, patient states that she has been extremely compliant with her antihypertensive regimen.  She states that sometimes for over a week at a time  she takes no antihypertensive medications.  She states that she does not want to frequently use the bathroom while at work while on her regimen of chlorthalidone.  Patient symptoms continue to worsen until she eventually presented to [facility] emergency department for evaluation.  Upon evaluation in the emergency department patient was found to have markedly elevated blood pressures as high as 226/106.  Patient was initiated on nitroglycerin infusion.  The hospitalist group was then called to assess patient for admission the hospital.   Hospital Course   Hypertensive emergency   Patient presenting with complaints of shortness of breath and intermittent bouts of chest discomfort.  Initial evaluation revealing patient with new onset left bundle branch block and  slightly elevated troponin concerning for hypertensive urgency and early hypertensive emergency.  Patient was already initiated on a nitroglycerin infusion by the emergency department staff.  Unfortunately, with this infusion the patient is becoming hypotensive , it has been stopped .  Target blood pressures should be approximately 25% reduction from her initial presenting blood pressure of 226/106, she did receive Norvasc 10 mg oral daily this morning, as well I have ordered her to receive her this morning 10 mg, her blood pressure has been around current target, most recent 161/97, as well I have ordered her to receive EDARBYCLOR before discharge.  Patient blood pressure uncontrolled most likely due to noncompliance as she has not been taking any of her antihypertensive regimen, as well she has not been compliant with low-salt diet as well, now her blood pressure has improved, she was counseled at length about compliance, and patient reports she will comply, and follow with her PCP next week, and this is to be adjusted further if remains elevated despite her being compliant with her meds, as well she will comply with low-salt diet.  Patient was ambulated in the hallway, no dyspnea, no chest pain.     Elevated troponin level not due myocardial infarction   Slightly elevated troponin in this patient with atypical chest discomfort, ACS ruled out 16>38>40, her troponins minimally elevated due to hypertensive urgency.  Chest discomfort is nonradiating and occurring both with and without exertion.  I feel the chest discomfort is most likely related to the markedly elevated blood pressures.  Patient is unlikely to be suffering from plaque rupture, patient was ambulated in the hallway, no recurrence of chest pain or shortness of breath.  She was started on aspirin daily.  Patient EKG with left bundle branch block, with no cardiac evaluation in the past, so 2D echo was obtained, with no evidence of  regional wall motion abnormalities, as well she is with a preserved EF, this needs to be further work-up which can be done as an outpatient, discussed with cardiology to arrange for outpatient follow-up.  Chronic diastolic CHF -2D echo was obtained, showing grade 2 diastolic dysfunction, she does have some trace edema, she does need some diuresis, she is on chlorthalidone, I have discussed with her about importance of compliance, and low-salt diet, so current dose of chlorthalidone should be sufficient when she is compliant.    Class 3 severe obesity due to excess calories with serious comorbidity and body mass index (BMI) of 45.0 to 49.9 in adult [facility])   Once patient clinically improves will counsel patient on caloric restriction and regular physical activity    Mixed hyperlipidemia  Continue home regimen of statin therapy  Obtain lipid panel during hospital stay with LDL and total cholesterol elevated, LDL is at 176, she  is on Crestor, but she is noncompliant, has not been taking it, so she was encouraged to continue with current dose of Crestor, and to reassess as an outpatient after compliance if dose need to be adjusted .     Discharge Condition:  Stable   Follow UP   Follow-up Information    [name], MD Follow up in 1 week(s).   Specialty: Internal Medicine Contact information: [address] [place] [address] [phone]                 Discharge Instructions  and  Discharge Medications     Discharge Instructions    Diet - low sodium heart healthy   Complete by: As directed    Discharge instructions   Complete by: As directed    Follow with Primary MD [name], MD in 7 days   Get CBC, CMP, checked  by Primary MD next visit.    Activity: As tolerated with Full fall precautions use walker/cane & assistance as needed   Disposition Home   Diet: Heart Healthy , low salt .  On your next visit with your primary care  physician please Get Medicines reviewed and adjusted.   Please request your Prim.MD to go over all Hospital Tests and Procedure/Radiological results at the follow up, please get all Hospital records sent to your Prim MD by signing hospital release before you go home.   If you experience worsening of your admission symptoms, develop shortness of breath, life threatening emergency, suicidal or homicidal thoughts you must seek medical attention immediately by calling 911 or calling your MD immediately  if symptoms less severe.  You Must read complete instructions/literature along with all the possible adverse reactions/side effects for all the Medicines you take and that have been prescribed to you. Take any new Medicines after you have completely understood and accpet all the possible adverse reactions/side effects.   Do not drive, operating heavy machinery, perform activities at heights, swimming or participation in water activities or provide baby sitting services if your were admitted for syncope or siezures until you have seen by Primary MD or a Neurologist and advised to do so again.  Do not drive when taking Pain medications.    Do not take more than prescribed Pain, Sleep and Anxiety Medications  Special Instructions: If you have smoked or chewed Tobacco  in the last 2 yrs please stop smoking, stop any regular Alcohol  and or any Recreational drug use.  Wear Seat belts while driving.   Please note  You were cared for by a hospitalist during your hospital stay. If you have any questions about your discharge medications or the care you received while you were in the hospital after you are discharged, you can call the unit and asked to speak with the hospitalist on call if the hospitalist that took care of you is not available. Once you are discharged, your primary care physician will handle any further medical issues. Please note that NO REFILLS for any discharge medications will be  authorized once you are discharged, as it is imperative that you return to your primary care physician (or establish a relationship with a primary care physician if you do not have one) for your aftercare needs so that they can reassess your need for medications and monitor your lab values.   Increase activity slowly   Complete by: As directed      Allergies as of 09/18/2019      Reactions  Pollen Extract Shortness Of Breath   Latex Rash   Codeine Hives   Shellfish Allergy Hives   Betadine [povidone Iodine] Rash      Medication List    STOP taking these medications   celecoxib 200 MG capsule Commonly known as: CeleBREX     TAKE these medications   albuterol 108 (90 Base) MCG/ACT inhaler Commonly known as: VENTOLIN HFA Inhale 2 puffs into the lungs every 6 (six) hours as needed for wheezing or shortness of breath.   amLODipine 10 MG tablet Commonly known as: NORVASC Take 1 tablet (10 mg total) by mouth daily.   aspirin EC 81 MG tablet Take 1 tablet (81 mg total) by mouth daily. Swallow whole.   Edarbyclor 40-25 MG Tabs Generic drug: Azilsartan-Chlorthalidone Take 1 tablet by mouth daily.   loratadine 10 MG tablet Commonly known as: Claritin Take 1 tablet (10 mg total) by mouth daily.   rosuvastatin 20 MG tablet Commonly known as: CRESTOR TAKE 1 TABLET IN THE P.M.   Vitamin D (Ergocalciferol) 1.25 MG (50000 UNIT) Caps capsule Commonly known as: DRISDOL Take 1 capsule (50,000 Units total) by mouth every 7 (seven) days.         Diet and Activity recommendation: See Discharge Instructions above   Consults obtained -  none   Major procedures and Radiology Reports - PLEASE review detailed and final reports for all details, in brief -     DG Chest 2 View  Result Date: 09/17/2019 CLINICAL DATA:  54 year old female with shortness of breath for 2 days. EXAM: CHEST - 2 VIEW COMPARISON:  None. FINDINGS: Cardiomegaly. Other mediastinal contours are within normal  limits. Visualized tracheal air column is within normal limits. Both lungs appear clear. No pneumothorax or pleural effusion. No acute osseous abnormality identified. Negative visible bowel gas pattern. IMPRESSION: Mild to moderate cardiomegaly with no acute pulmonary abnormality. Electronically Signed   By: Genevie Ann M.D.   On: 09/17/2019 19:38   DG Chest Port 1 View  Result Date: 09/17/2019 CLINICAL DATA:  Chest pain, dyspnea EXAM: PORTABLE CHEST 1 VIEW COMPARISON:  09/17/2019 at 7:27 p.m. FINDINGS: Lungs are clear. No pneumothorax or pleural effusion. Mild cardiomegaly is again seen, stable since prior examination. Pulmonary vascularity is normal. No acute bone abnormality. IMPRESSION: Stable examination.  Mild cardiomegaly. Electronically Signed   By: Fidela Salisbury MD   On: 09/17/2019 22:40   ECHOCARDIOGRAM COMPLETE  Result Date: 09/18/2019    ECHOCARDIOGRAM REPORT   Patient Name:   WING GFELLER Date of Exam: 09/18/2019 Medical Rec #:  [id number]          Height:       69.0 in Accession #:    [id number]         Weight:       320.0 lb Date of Birth:  11-Nov-1965          BSA:          2.523 m Patient Age:    62 years           BP:           107/49 mmHg Patient Gender: F                  HR:           59 bpm. Exam Location:  Inpatient Procedure: 2D Echo, Cardiac Doppler and Color Doppler Indications:    CHF  History:        Patient has no  prior history of Echocardiogram examinations.                 Signs/Symptoms:Shortness of Breath and Chest Pain; Risk                 Factors:Morbid obesity, Hypertension and Dyslipidemia.  Sonographer:    Dustin Flock Referring Phys: [id number] Vernelle Emerald  Sonographer Comments: Patient is morbidly obese. Image acquisition challenging due to patient body habitus. IMPRESSIONS  1. Left ventricular ejection fraction, by estimation, is 55 to 60%. The left ventricle has normal function. The left ventricle has no regional wall motion abnormalities. There is mild left  ventricular hypertrophy. Left ventricular diastolic parameters are consistent with Grade II diastolic dysfunction (pseudonormalization).  2. Right ventricular systolic function is normal. The right ventricular size is normal.  3. The mitral valve is normal in structure. Trivial mitral valve regurgitation. No evidence of mitral stenosis.  4. The aortic valve is normal in structure. Aortic valve regurgitation is not visualized. No aortic stenosis is present.  5. The inferior vena cava is normal in size with greater than 50% respiratory variability, suggesting right atrial pressure of 3 mmHg. FINDINGS  Left Ventricle: Left ventricular ejection fraction, by estimation, is 55 to 60%. The left ventricle has normal function. The left ventricle has no regional wall motion abnormalities. The left ventricular internal cavity size was normal in size. There is  mild left ventricular hypertrophy. Abnormal (paradoxical) septal motion, consistent with left bundle branch block. Left ventricular diastolic parameters are consistent with Grade II diastolic dysfunction (pseudonormalization). Right Ventricle: The right ventricular size is normal. No increase in right ventricular wall thickness. Right ventricular systolic function is normal. Left Atrium: Left atrial size was normal in size. Right Atrium: Right atrial size was normal in size. Pericardium: There is no evidence of pericardial effusion. Mitral Valve: The mitral valve is normal in structure. Normal mobility of the mitral valve leaflets. Trivial mitral valve regurgitation. No evidence of mitral valve stenosis. Tricuspid Valve: The tricuspid valve is normal in structure. Tricuspid valve regurgitation is not demonstrated. No evidence of tricuspid stenosis. Aortic Valve: The aortic valve is normal in structure. Aortic valve regurgitation is not visualized. No aortic stenosis is present. Pulmonic Valve: The pulmonic valve was normal in structure. Pulmonic valve regurgitation is  not visualized. No evidence of pulmonic stenosis. Aorta: The aortic root is normal in size and structure. Venous: The inferior vena cava is normal in size with greater than 50% respiratory variability, suggesting right atrial pressure of 3 mmHg. IAS/Shunts: No atrial level shunt detected by color flow Doppler.  LEFT VENTRICLE PLAX 2D LVIDd:         5.20 cm  Diastology LVIDs:         3.70 cm  LV e' lateral:   7.18 cm/s LV PW:         1.50 cm  LV E/e' lateral: 14.8 LV IVS:        1.40 cm  LV e' medial:    6.09 cm/s LVOT diam:     2.00 cm  LV E/e' medial:  17.4 LV SV:         51 LV SV Index:   20 LVOT Area:     3.14 cm  RIGHT VENTRICLE RV Basal diam:  2.60 cm RV S prime:     7.62 cm/s TAPSE (M-mode): 3.0 cm LEFT ATRIUM             Index       RIGHT  ATRIUM           Index LA diam:        4.20 cm 1.66 cm/m  RA Area:     14.80 cm LA Vol (A2C):   65.9 ml 26.12 ml/m RA Volume:   37.90 ml  15.02 ml/m LA Vol (A4C):   63.8 ml 25.29 ml/m LA Biplane Vol: 65.4 ml 25.92 ml/m  AORTIC VALVE LVOT Vmax:   86.20 cm/s LVOT Vmean:  53.700 cm/s LVOT VTI:    0.162 m  AORTA Ao Root diam: 2.70 cm MITRAL VALVE MV Area (PHT): 3.85 cm     SHUNTS MV Decel Time: 197 msec     Systemic VTI:  0.16 m MV E velocity: 106.00 cm/s  Systemic Diam: 2.00 cm MV A velocity: 90.80 cm/s MV E/A ratio:  1.17 Candee Furbish MD Electronically signed by Candee Furbish MD Signature Date/Time: 09/18/2019/10:59:02 AM    Final     Micro Results     Recent Results (from the past 240 hour(s))  SARS CORONAVIRUS 2 (TAT 6-24 HRS) Nasopharyngeal Nasopharyngeal Swab     Status: None   Collection Time: 09/17/19  7:13 PM   Specimen: Nasopharyngeal Swab  Result Value Ref Range Status   SARS Coronavirus 2 NEGATIVE NEGATIVE Final    Comment: (NOTE) SARS-CoV-2 target nucleic acids are NOT DETECTED.  The SARS-CoV-2 RNA is generally detectable in upper and lower respiratory specimens during the acute phase of infection. Negative results do not preclude SARS-CoV-2  infection, do not rule out co-infections with other pathogens, and should not be used as the sole basis for treatment or other patient management decisions. Negative results must be combined with clinical observations, patient history, and epidemiological information. The expected result is Negative.  Fact Sheet for Patients: SugarRoll.be  Fact Sheet for Healthcare Providers: https://www.woods-mathews.com/  This test is not yet approved or cleared by the [place] FDA and  has been authorized for detection and/or diagnosis of SARS-CoV-2 by FDA under an Emergency Use Authorization (EUA). This EUA will remain  in effect (meaning this test can be used) for the duration of the COVID-19 declaration under Se ction 564(b)(1) of the Act, 21 U.S.C. section 360bbb-3(b)(1), unless the authorization is terminated or revoked sooner.  Performed at [facility], [place] [address] [place], [place] [address]        Today   Subjective:   Chastelyn Athens today has no headache,no chest abdominal pain,no new weakness tingling or numbness, feels much better wants to go home today.   Objective:   Blood pressure (!) 161/97, pulse 69, temperature 98.7 F (37.1 C), temperature source Oral, resp. rate 17, height 5\' 9"  (1.753 m), weight (!) 145.2 kg, SpO2 96 %.   Intake/Output Summary (Last 24 hours) at 09/18/2019 1301 Last data filed at 09/18/2019 0753 Gross per 24 hour  Intake --  Output 825 ml  Net -825 ml    Exam Awake Alert, Oriented x 3, No new F.N deficits, Normal affect Symmetrical Chest wall movement, Good air movement bilaterally, CTAB RRR,No Gallops,Rubs or new Murmurs, No Parasternal Heave +ve B.Sounds, Abd Soft, Non tender, No organomegaly appriciated, No rebound -guarding or rigidity. No Cyanosis, Clubbing or edema, No new Rash or bruise  Data Review   CBC w Diff:  Lab Results  Component Value Date   WBC 7.8 09/18/2019    HGB 11.9 (L) 09/18/2019   HGB 12.1 06/17/2019   HCT 38.6 09/18/2019   HCT 37.3 06/17/2019   PLT  219 09/18/2019   PLT 229 06/17/2019   LYMPHOPCT 15 09/18/2019   MONOPCT 9 09/18/2019   EOSPCT 1 09/18/2019   BASOPCT 1 09/18/2019    CMP:  Lab Results  Component Value Date   NA 136 09/18/2019   NA 140 06/17/2019   K 3.7 09/18/2019   CL 99 09/18/2019   CO2 26 09/18/2019   BUN 7 09/18/2019   BUN 8 06/17/2019   CREATININE 0.85 09/18/2019   PROT 6.8 09/18/2019   PROT 6.7 06/17/2019   ALBUMIN 3.8 09/18/2019   ALBUMIN 4.5 06/17/2019   BILITOT 1.5 (H) 09/18/2019   BILITOT 0.7 06/17/2019   ALKPHOS 58 09/18/2019   AST 17 09/18/2019   ALT 15 09/18/2019  .   Total Time in preparing paper work, data evaluation and todays exam - 67 minutes  Phillips Climes M.D on 09/18/2019 at 1:01 PM  [facility]   Office  [phone]

## 2019-09-18 NOTE — ED Notes (Signed)
md called to notify rn to hold pt discharge until outpatient cardiology calls to setup an appt.

## 2019-09-18 NOTE — Discharge Instructions (Signed)
Follow with Primary MD [name], MD in 7 days   Get CBC, CMP, checked  by Primary MD next visit.    Activity: As tolerated with Full fall precautions use walker/cane & assistance as needed   Disposition Home   Diet: Heart Healthy , low salt .  On your next visit with your primary care physician please Get Medicines reviewed and adjusted.   Please request your Prim.MD to go over all Hospital Tests and Procedure/Radiological results at the follow up, please get all Hospital records sent to your Prim MD by signing hospital release before you go home.   If you experience worsening of your admission symptoms, develop shortness of breath, life threatening emergency, suicidal or homicidal thoughts you must seek medical attention immediately by calling 911 or calling your MD immediately  if symptoms less severe.  You Must read complete instructions/literature along with all the possible adverse reactions/side effects for all the Medicines you take and that have been prescribed to you. Take any new Medicines after you have completely understood and accpet all the possible adverse reactions/side effects.   Do not drive, operating heavy machinery, perform activities at heights, swimming or participation in water activities or provide baby sitting services if your were admitted for syncope or siezures until you have seen by Primary MD or a Neurologist and advised to do so again.  Do not drive when taking Pain medications.    Do not take more than prescribed Pain, Sleep and Anxiety Medications  Special Instructions: If you have smoked or chewed Tobacco  in the last 2 yrs please stop smoking, stop any regular Alcohol  and or any Recreational drug use.  Wear Seat belts while driving.   Please note  You were cared for by a hospitalist during your hospital stay. If you have any questions about your discharge medications or the care you received while you were in the hospital after you are  discharged, you can call the unit and asked to speak with the hospitalist on call if the hospitalist that took care of you is not available. Once you are discharged, your primary care physician will handle any further medical issues. Please note that NO REFILLS for any discharge medications will be authorized once you are discharged, as it is imperative that you return to your primary care physician (or establish a relationship with a primary care physician if you do not have one) for your aftercare needs so that they can reassess your need for medications and monitor your lab values.

## 2019-09-18 NOTE — ED Notes (Signed)
Patient verbalizes understanding of discharge instructions. Opportunity for questioning and answers were provided. Armband removed by staff, pt discharged from ED ambulatory.   

## 2019-09-18 NOTE — ED Notes (Signed)
Breakfast ordered--Pamela [name]  

## 2019-09-22 ENCOUNTER — Ambulatory Visit: Payer: 59 | Admitting: Internal Medicine

## 2019-09-22 ENCOUNTER — Other Ambulatory Visit: Payer: Self-pay

## 2019-09-22 ENCOUNTER — Encounter: Payer: Self-pay | Admitting: Internal Medicine

## 2019-09-22 VITALS — BP 148/88 | HR 76 | Temp 98.1°F | Ht 69.0 in | Wt 320.2 lb

## 2019-09-22 DIAGNOSIS — E782 Mixed hyperlipidemia: Secondary | ICD-10-CM | POA: Diagnosis not present

## 2019-09-22 DIAGNOSIS — Z1159 Encounter for screening for other viral diseases: Secondary | ICD-10-CM

## 2019-09-22 DIAGNOSIS — I10 Essential (primary) hypertension: Secondary | ICD-10-CM | POA: Diagnosis not present

## 2019-09-22 DIAGNOSIS — Z6841 Body Mass Index (BMI) 40.0 and over, adult: Secondary | ICD-10-CM

## 2019-09-22 DIAGNOSIS — I519 Heart disease, unspecified: Secondary | ICD-10-CM | POA: Diagnosis not present

## 2019-09-22 DIAGNOSIS — I5189 Other ill-defined heart diseases: Secondary | ICD-10-CM

## 2019-09-22 MED ORDER — VITAMIN D (ERGOCALCIFEROL) 1.25 MG (50000 UNIT) PO CAPS: 50000.0000 [IU] | ORAL_CAPSULE | ORAL | 1 refills | Status: DC

## 2019-09-22 MED ORDER — ROSUVASTATIN CALCIUM 20 MG PO TABS: ORAL_TABLET | ORAL | 2 refills | Status: DC

## 2019-09-22 NOTE — Progress Notes (Signed)
I,[name],acting as a Education administrator for [name], MD.,have documented all relevant documentation on the behalf of [name], MD,as directed by  [name], MD while in the presence of [name], MD.  This visit occurred during the SARS-CoV-2 public health emergency.  Safety protocols were in place, including screening questions prior to the visit, additional usage of staff PPE, and extensive cleaning of exam room while observing appropriate contact time as indicated for disinfecting solutions.  Subjective:     Patient ID: Pamela [name] , female    DOB: 1966/01/21 , 54 y.o.   MRN: [id number]   Chief Complaint  Patient presents with  . Hypertension    HPI  The patient is here today for a ER f/u. She is a 54 y.o. female with past medical history of hyperlipidemia, hypertension and obesity who presented to the ED from urgent care on 7/15 for evaluation of abnormal EKG.  Patient reports feeling anxious, restless last Monday.  Evening prior to admission,  she had central chest heaviness that has been constant since, 8/10.  This was worsened with exertion. Has noticed associated shortness of breath and feeling of anxiety with it.  Admits that she is not taking her blood pressure medicines as prescribed because they make her urinate too much.  Reports having similar episodes of shortness of breath and chest discomfort in March of this year.  She had abnormal lab test including D-dimer but did not follow-up with CT scan that was ordered because she states her symptoms improved after some steroids and breathing inhaler.  Denies any personal history of CAD.  No tobacco use.  No Covid vaccine. Denies fever,, orthopnea, extremity swelling, calf pain.  No associated nausea, vomiting, diaphoresis, neck or arm pain.  No history of PE or DVT.  ER workup negative for acute MI, bp improved with iv nitroglycerin. She reports "they" wanted to admit her, but no beds were available.  She  has been feeling better since discharge. There is still some sob with exertion. She needs note to return to work next week.   Hypertension This is a chronic problem. The current episode started more than 1 year ago. The problem has been gradually improving since onset. The problem is uncontrolled. Pertinent negatives include no blurred vision, chest pain, palpitations or shortness of breath. Risk factors for coronary artery disease include dyslipidemia, obesity and sedentary lifestyle. Past treatments include ACE inhibitors, angiotensin blockers, beta blockers and diuretics. Compliance problems include exercise and medication side effects.      Past Medical History:  Diagnosis Date  . Asthma   . DUB (dysfunctional uterine bleeding) 2007  . Elevated cholesterol   . H/O menorrhagia 05/01/2006  . History of ovarian cyst 2007  . Hypertension 03/31/2005  . Increased BMI 07/11/2006  . Migraine   . N&V (nausea and vomiting)   . Obesity 2007  . Obstructive sleep apnea syndrome, moderate 10/27/2013  . Weight loss 07/11/2006     Family History  Problem Relation Age of Onset  . Diabetes Maternal Grandmother   . Hypertension Mother   . Hypertension Father   . Heart attack Father      Current Outpatient Medications:  .  albuterol (VENTOLIN HFA) 108 (90 Base) MCG/ACT inhaler, Inhale 2 puffs into the lungs every 6 (six) hours as needed for wheezing or shortness of breath., Disp: 1 Inhaler, Rfl: 2 .  amLODipine (NORVASC) 10 MG tablet, Take 1 tablet (10 mg total) by mouth daily.,  Disp: 90 tablet, Rfl: 2 .  aspirin EC 81 MG tablet, Take 1 tablet (81 mg total) by mouth daily. Swallow whole., Disp: 30 tablet, Rfl: 11 .  Azilsartan-Chlorthalidone (EDARBYCLOR) 40-25 MG TABS, Take 1 tablet by mouth daily., Disp: 30 tablet, Rfl: 0 .  rosuvastatin (CRESTOR) 20 MG tablet, TAKE 1 TABLET IN THE P.M., Disp: 90 tablet, Rfl: 2 .  loratadine (CLARITIN) 10 MG tablet, Take 1 tablet (10 mg total) by mouth daily. (Patient  not taking: Reported on 05/13/2019), Disp: 90 tablet, Rfl: 2 .  Vitamin D, Ergocalciferol, (DRISDOL) 1.25 MG (50000 UNIT) CAPS capsule, Take 1 capsule (50,000 Units total) by mouth every 7 (seven) days., Disp: 24 capsule, Rfl: 1   Allergies  Allergen Reactions  . Pollen Extract Shortness Of Breath  . Latex Rash  . Codeine Hives  . Shellfish Allergy Hives  . Betadine [Povidone Iodine] Rash     Review of Systems  Constitutional: Negative.  Negative for fever.  HENT: Negative for sore throat.   Eyes: Negative for blurred vision.  Respiratory: Negative.  Negative for shortness of breath and wheezing.   Cardiovascular: Negative.  Negative for chest pain, palpitations and leg swelling.  Gastrointestinal: Negative.  Negative for abdominal pain.  Psychiatric/Behavioral: Negative.   All other systems reviewed and are negative.    Today's Vitals   09/22/19 0929  BP: (!) 148/88  Pulse: 76  Temp: 98.1 F (36.7 C)  TempSrc: Oral  SpO2: 98%  Weight: (!) 320 lb 3.2 oz (145.2 kg)  Height: 5' 9"  (1.753 m)   Body mass index is 47.29 kg/m.  Wt Readings from Last 3 Encounters:  09/22/19 (!) 320 lb 3.2 oz (145.2 kg)  09/17/19 (!) 320 lb (145.2 kg)  07/21/19 (!) 319 lb (144.7 kg)   Objective:  Physical Exam Vitals and nursing note reviewed.  Constitutional:      Appearance: Normal appearance. She is obese.  HENT:     Head: Normocephalic and atraumatic.  Cardiovascular:     Rate and Rhythm: Normal rate and regular rhythm.     Heart sounds: Normal heart sounds.  Pulmonary:     Breath sounds: Normal breath sounds.  Skin:    General: Skin is warm.  Neurological:     General: No focal deficit present.     Mental Status: She is alert and oriented to person, place, and time.         Assessment And Plan:     1. Malignant hypertension  Comments: ER records reviewed in full detail during her visit. Improved control, yet not at goal. I will refer her to HTN clinic with Dr. Oval Linsey.  Importance of compliance was discussed with the patient. She has been referred to Neuro for sleep study; she has had initial consultation but not the actual test. Encouraged to schedule this in the near future, she has risk factors for OSA. Risk factors associated with untreated OSA were discussed with her in detail.   - BMP8+EGFR - Ambulatory referral to Cardiology - CBC no Diff  2. Mixed hyperlipidemia  Comments: Chronic, importance of medication compliance was discussed with the patient. Refill was sent to the pharmacy. Most recent lipid results uncontrolled due to noncompliance. Will recheck at a later date.   3. Echocardiogram shows left ventricular diastolic dysfunction  Comments: Echo reviewed in full detail. Results discussed with patient in full detail.  - Ambulatory referral to Cardiology  4. Class 3 severe obesity due to excess calories with serious comorbidity and  body mass index (BMI) of 45.0 to 49.9 in adult [facility]) Comments: BMI 47. She is encouraged to initially strive for BMI less than 40 to decrease cardiac risk.   5. Encounter for HCV screening test for low risk patient - Hepatitis C antibody     Patient was given opportunity to ask questions. Patient verbalized understanding of the plan and was able to repeat key elements of the plan. All questions were answered to their satisfaction.  [name], MD   I, [name], MD, have reviewed all documentation for this visit. The documentation on 09/22/19 for the exam, diagnosis, procedures, and orders are all accurate and complete.  THE PATIENT IS ENCOURAGED TO PRACTICE SOCIAL DISTANCING DUE TO THE COVID-19 PANDEMIC.

## 2019-09-22 NOTE — Patient Instructions (Addendum)
Calm, one teaspoon nightly - magnesium supplementation   DASH Eating Plan DASH stands for "Dietary Approaches to Stop Hypertension." The DASH eating plan is a healthy eating plan that has been shown to reduce high blood pressure (hypertension). It may also reduce your risk for type 2 diabetes, heart disease, and stroke. The DASH eating plan may also help with weight loss. What are tips for following this plan?  General guidelines  Avoid eating more than 2,300 mg (milligrams) of salt (sodium) a day. If you have hypertension, you may need to reduce your sodium intake to 1,500 mg a day.  Limit alcohol intake to no more than 1 drink a day for nonpregnant women and 2 drinks a day for men. One drink equals 12 oz of beer, 5 oz of wine, or 1 oz of hard liquor.  Work with your health care provider to maintain a healthy body weight or to lose weight. Ask what an ideal weight is for you.  Get at least 30 minutes of exercise that causes your heart to beat faster (aerobic exercise) most days of the week. Activities may include walking, swimming, or biking.  Work with your health care provider or diet and nutrition specialist (dietitian) to adjust your eating plan to your individual calorie needs. Reading food labels   Check food labels for the amount of sodium per serving. Choose foods with less than 5 percent of the Daily Value of sodium. Generally, foods with less than 300 mg of sodium per serving fit into this eating plan.  To find whole grains, look for the word "whole" as the first word in the ingredient list. Shopping  Buy products labeled as "low-sodium" or "no salt added."  Buy fresh foods. Avoid canned foods and premade or frozen meals. Cooking  Avoid adding salt when cooking. Use salt-free seasonings or herbs instead of table salt or sea salt. Check with your health care provider or pharmacist before using salt substitutes.  Do not fry foods. Cook foods using healthy methods such as  baking, boiling, grilling, and broiling instead.  Cook with heart-healthy oils, such as olive, canola, soybean, or sunflower oil. Meal planning  Eat a balanced diet that includes: ? 5 or more servings of fruits and vegetables each day. At each meal, try to fill half of your plate with fruits and vegetables. ? Up to 6-8 servings of whole grains each day. ? Less than 6 oz of lean meat, poultry, or fish each day. A 3-oz serving of meat is about the same size as a deck of cards. One egg equals 1 oz. ? 2 servings of low-fat dairy each day. ? A serving of nuts, seeds, or beans 5 times each week. ? Heart-healthy fats. Healthy fats called Omega-3 fatty acids are found in foods such as flaxseeds and coldwater fish, like sardines, salmon, and mackerel.  Limit how much you eat of the following: ? Canned or prepackaged foods. ? Food that is high in trans fat, such as fried foods. ? Food that is high in saturated fat, such as fatty meat. ? Sweets, desserts, sugary drinks, and other foods with added sugar. ? Full-fat dairy products.  Do not salt foods before eating.  Try to eat at least 2 vegetarian meals each week.  Eat more home-cooked food and less restaurant, buffet, and fast food.  When eating at a restaurant, ask that your food be prepared with less salt or no salt, if possible. What foods are recommended? The items listed may not  be a complete list. Talk with your dietitian about what dietary choices are best for you. Grains Whole-grain or whole-wheat bread. Whole-grain or whole-wheat pasta. Brown rice. Modena Morrow. Bulgur. Whole-grain and low-sodium cereals. Pita bread. Low-fat, low-sodium crackers. Whole-wheat flour tortillas. Vegetables Fresh or frozen vegetables (raw, steamed, roasted, or grilled). Low-sodium or reduced-sodium tomato and vegetable juice. Low-sodium or reduced-sodium tomato sauce and tomato paste. Low-sodium or reduced-sodium canned vegetables. Fruits All fresh,  dried, or frozen fruit. Canned fruit in natural juice (without added sugar). Meat and other protein foods Skinless chicken or [place]. Ground chicken or [place]. Pork with fat trimmed off. Fish and seafood. Egg whites. Dried beans, peas, or lentils. Unsalted nuts, nut butters, and seeds. Unsalted canned beans. Lean cuts of beef with fat trimmed off. Low-sodium, lean deli meat. Dairy Low-fat (1%) or fat-free (skim) milk. Fat-free, low-fat, or reduced-fat cheeses. Nonfat, low-sodium ricotta or cottage cheese. Low-fat or nonfat yogurt. Low-fat, low-sodium cheese. Fats and oils Soft margarine without trans fats. Vegetable oil. Low-fat, reduced-fat, or light mayonnaise and salad dressings (reduced-sodium). Canola, safflower, olive, soybean, and sunflower oils. Avocado. Seasoning and other foods Herbs. Spices. Seasoning mixes without salt. Unsalted popcorn and pretzels. Fat-free sweets. What foods are not recommended? The items listed may not be a complete list. Talk with your dietitian about what dietary choices are best for you. Grains Baked goods made with fat, such as croissants, muffins, or some breads. Dry pasta or rice meal packs. Vegetables Creamed or fried vegetables. Vegetables in a cheese sauce. Regular canned vegetables (not low-sodium or reduced-sodium). Regular canned tomato sauce and paste (not low-sodium or reduced-sodium). Regular tomato and vegetable juice (not low-sodium or reduced-sodium). Angie Fava. Olives. Fruits Canned fruit in a light or heavy syrup. Fried fruit. Fruit in cream or butter sauce. Meat and other protein foods Fatty cuts of meat. Ribs. Fried meat. Berniece Salines. Sausage. Bologna and other processed lunch meats. Salami. Fatback. Hotdogs. Bratwurst. Salted nuts and seeds. Canned beans with added salt. Canned or smoked fish. Whole eggs or egg yolks. Chicken or [place] with skin. Dairy Whole or 2% milk, cream, and half-and-half. Whole or full-fat cream cheese. Whole-fat or sweetened  yogurt. Full-fat cheese. Nondairy creamers. Whipped toppings. Processed cheese and cheese spreads. Fats and oils Butter. Stick margarine. Lard. Shortening. Ghee. Bacon fat. Tropical oils, such as coconut, palm kernel, or palm oil. Seasoning and other foods Salted popcorn and pretzels. Onion salt, garlic salt, seasoned salt, table salt, and sea salt. Worcestershire sauce. Tartar sauce. Barbecue sauce. Teriyaki sauce. Soy sauce, including reduced-sodium. Steak sauce. Canned and packaged gravies. Fish sauce. Oyster sauce. Cocktail sauce. Horseradish that you find on the shelf. Ketchup. Mustard. Meat flavorings and tenderizers. Bouillon cubes. Hot sauce and Tabasco sauce. Premade or packaged marinades. Premade or packaged taco seasonings. Relishes. Regular salad dressings. Where to find more information:  National Heart, Lung, and [place]: https://Kopko-eaton.com/  American Heart Association: www.heart.org Summary  The DASH eating plan is a healthy eating plan that has been shown to reduce high blood pressure (hypertension). It may also reduce your risk for type 2 diabetes, heart disease, and stroke.  With the DASH eating plan, you should limit salt (sodium) intake to 2,300 mg a day. If you have hypertension, you may need to reduce your sodium intake to 1,500 mg a day.  When on the DASH eating plan, aim to eat more fresh fruits and vegetables, whole grains, lean proteins, low-fat dairy, and heart-healthy fats.  Work with your health care provider or diet and nutrition  specialist (dietitian) to adjust your eating plan to your individual calorie needs. This information is not intended to replace advice given to you by your health care provider. Make sure you discuss any questions you have with your health care provider. Document Revised: 02/01/2017 Document Reviewed: 02/13/2016 Elsevier Patient Education  2020 [employer].

## 2019-09-23 LAB — CBC
Hematocrit: 41.3 % (ref 34.0–46.6)
Hemoglobin: 13.2 g/dL (ref 11.1–15.9)
MCH: 26.6 pg (ref 26.6–33.0)
MCHC: 32 g/dL (ref 31.5–35.7)
MCV: 83 fL (ref 79–97)
Platelets: 247 10*3/uL (ref 150–450)
RBC: 4.97 x10E6/uL (ref 3.77–5.28)
RDW: 14.7 % (ref 11.7–15.4)
WBC: 7 10*3/uL (ref 3.4–10.8)

## 2019-09-23 LAB — BMP8+EGFR
BUN/Creatinine Ratio: 11 (ref 9–23)
BUN: 11 mg/dL (ref 6–24)
CO2: 27 mmol/L (ref 20–29)
Calcium: 10.2 mg/dL (ref 8.7–10.2)
Chloride: 98 mmol/L (ref 96–106)
Creatinine, Ser: 0.97 mg/dL (ref 0.57–1.00)
GFR calc Af Amer: 77 mL/min/{1.73_m2} (ref >59)
GFR calc non Af Amer: 67 mL/min/{1.73_m2} (ref >59)
Glucose: 95 mg/dL (ref 65–99)
Potassium: 4.3 mmol/L (ref 3.5–5.2)
Sodium: 142 mmol/L (ref 134–144)

## 2019-09-23 LAB — HEPATITIS C ANTIBODY: Hep C Virus Ab: <0.1 s/co ratio (ref 0.0–0.9)

## 2019-10-08 ENCOUNTER — Ambulatory Visit: Payer: 59 | Admitting: Cardiovascular Disease

## 2019-10-08 ENCOUNTER — Encounter: Payer: Self-pay | Admitting: Cardiovascular Disease

## 2019-10-08 ENCOUNTER — Other Ambulatory Visit: Payer: Self-pay

## 2019-10-08 VITALS — BP 134/86 | HR 71 | Ht 69.0 in | Wt 325.8 lb

## 2019-10-08 DIAGNOSIS — I10 Essential (primary) hypertension: Secondary | ICD-10-CM | POA: Diagnosis not present

## 2019-10-08 DIAGNOSIS — I2510 Atherosclerotic heart disease of native coronary artery without angina pectoris: Secondary | ICD-10-CM

## 2019-10-08 DIAGNOSIS — E66813 Obesity, class 3: Secondary | ICD-10-CM

## 2019-10-08 DIAGNOSIS — G4733 Obstructive sleep apnea (adult) (pediatric): Secondary | ICD-10-CM

## 2019-10-08 DIAGNOSIS — E785 Hyperlipidemia, unspecified: Secondary | ICD-10-CM | POA: Diagnosis not present

## 2019-10-08 DIAGNOSIS — Z6841 Body Mass Index (BMI) 40.0 and over, adult: Secondary | ICD-10-CM

## 2019-10-08 DIAGNOSIS — I5033 Acute on chronic diastolic (congestive) heart failure: Secondary | ICD-10-CM | POA: Diagnosis not present

## 2019-10-08 NOTE — Progress Notes (Signed)
10/08/2019 Pamela [name]   March 17, 1965  [id number]  Primary Physician Pamela Chard, MD Primary Cardiologist: Lorretta Harp MD Pamela [name], [place]  HPI:  Pamela [name] is a 54 y.o. morbidly overweight single African-American female mother of 2 children, soon-to-be grandmother of 1 grandchild referred by Dr. Baird [name] for cardiovascular evaluation because of hypertension and left bundle branch block. She has seen Pamela [name] in the past as well several years ago when she had an abnormal EKG. Her cardiac risk factor profile is notable for treated hypertension, hyperlipidemia and family history (father at age 16, mother at age 65 had myocardial infarction's). She is never had a heart attack or stroke. She denies chest pain or shortness of breath. 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 She was recently hospitalized overnight for hypertensive urgency and diastolic heart failure 44/81/85. Her enzymes were mildly elevated thought to be related to demand ischemia. 2D echo revealed normal LV function with grade 2 diastolic dysfunction. She was diuresed. She is seeing a dietitian in the near future. She is very salt restriction. She also has symptoms compatible with obstructive sleep apnea. When she was admitted she was complaining of some chest heaviness.   Current Meds  Medication Sig  . albuterol (VENTOLIN HFA) 108 (90 Base) MCG/ACT inhaler Inhale 2 puffs into the lungs every 6 (six) hours as needed for wheezing or shortness of breath.  Marland Kitchen amLODipine (NORVASC) 10 MG tablet Take 1 tablet (10 mg total) by mouth daily.  Marland Kitchen aspirin EC 81 MG tablet Take 1 tablet (81 mg total) by mouth daily. Swallow whole.  . Azilsartan-Chlorthalidone (EDARBYCLOR) 40-25 MG TABS Take 1 tablet by mouth daily.  . rosuvastatin (CRESTOR) 20 MG tablet TAKE 1 TABLET IN THE P.M.  . Vitamin D, Ergocalciferol, (DRISDOL) 1.25 MG (50000 UNIT) CAPS capsule Take 1 capsule (50,000 Units total) by mouth  every 7 (seven) days.     Allergies  Allergen Reactions  . Pollen Extract Shortness Of Breath  . Latex Rash  . Codeine Hives  . Shellfish Allergy Hives  . Betadine [Povidone Iodine] Rash    Social History   Socioeconomic History  . Marital status: Single    Spouse name: Not on file  . Number of children: Not on file  . Years of education: Not on file  . Highest education level: Not on file  Occupational History  . Not on file  Tobacco Use  . Smoking status: Never Smoker  . Smokeless tobacco: Never Used  Vaping Use  . Vaping Use: Never used  Substance and Sexual Activity  . Alcohol use: No  . Drug use: Not Currently    Frequency: 2.0 times per week  . Sexual activity: Never    Birth control/protection: I.U.D.    Comment: Mirena  Other Topics Concern  . Not on file  Social History Narrative  . Not on file   Social Determinants of Health   Financial Resource Strain:   . Difficulty of Paying Living Expenses:   Food Insecurity:   . Worried About Charity fundraiser in the Last Year:   . Arboriculturist in the Last Year:   Transportation Needs:   . Film/video editor (Medical):   Marland Kitchen [name] of Transportation (Non-Medical):   Physical Activity:   . Days of Exercise per Week:   . Minutes of Exercise per Session:   Stress:   . Feeling of Stress :   Social Connections:   .  Frequency of Communication with Friends and Family:   . Frequency of Social Gatherings with Friends and Family:   . Attends Religious Services:   . Active Member of Clubs or Organizations:   . Attends Archivist Meetings:   Marland Kitchen Marital Status:   Intimate Partner Violence:   . Fear of Current or Ex-Partner:   . Emotionally Abused:   Marland Kitchen Physically Abused:   . Sexually Abused:      Review of Systems: General: negative for chills, fever, night sweats or weight changes.  Cardiovascular: negative for chest pain, dyspnea on exertion, edema, orthopnea, palpitations, paroxysmal nocturnal  dyspnea or shortness of breath Dermatological: negative for rash Respiratory: negative for cough or wheezing Urologic: negative for hematuria Abdominal: negative for nausea, vomiting, diarrhea, bright red blood per rectum, melena, or hematemesis Neurologic: negative for visual changes, syncope, or dizziness All other systems reviewed and are otherwise negative except as noted above.    Blood pressure 134/86, pulse 71, height 5\' 9"  (1.753 m), weight (!) 325 lb 12.8 oz (147.8 kg).  General appearance: alert and no distress Neck: no adenopathy, no carotid bruit, no JVD, supple, symmetrical, trachea midline and thyroid not enlarged, symmetric, no tenderness/mass/nodules Lungs: clear to auscultation bilaterally Heart: regular rate and rhythm, S1, S2 normal, no murmur, click, rub or gallop Extremities: extremities normal, atraumatic, no cyanosis or edema Pulses: 2+ and symmetric Skin: Skin color, texture, turgor normal. No rashes or lesions Neurologic: Alert and oriented X 3, normal strength and tone. Normal symmetric reflexes. Normal coordination and gait  EKG sinus rhythm at 71 with nonspecific ST and T wave changes. Her EKG on 7/16 did show left bundle branch block. I personally reviewed this EKG.  ASSESSMENT AND PLAN:   Class 3 severe obesity due to excess calories with serious comorbidity and body mass index (BMI) of 45.0 to 49.9 in adult (Pamela [name]) ExerciseBMI 48. Patient is aware and wishes to lose weight. She is trying to and is scheduling appointment and a diet clinic.  High cholesterol History of hyperlipidemia on Crestor which she was not taking as reflected by her most recent lipid profile performed 09/18/19 revealing total cholesterol 236, LDL of 176 and HDL of 47. We will recheck a lipid liver profile in 2 months  HTN (hypertension) History of essential hypertension a blood pressure measured today 134/86. She does measure her blood pressure at home and is it is in the therapeutic  range. She is on Edarbi chlor and amlodipine.  Obstructive sleep apnea syndrome, moderate Body habitus and symptoms consistent with obstructive sleep apnea. She is being evaluated by neurologist for this. I think she would benefit from CPAP.  Acute on chronic diastolic heart failure ([place]) Recent mission for hypertensive urgency, chest heaviness, and shortness of breath. Her echo did show normal LV systolic function with grade 2 diastolic dysfunction. She was diuresed. She is aware of salt restriction.      Lorretta Harp MD FACP,FACC,FAHA, [facility] 10/08/2019 2:37 PM

## 2019-10-08 NOTE — Assessment & Plan Note (Signed)
ExerciseBMI 48. Patient is aware and wishes to lose weight. She is trying to and is scheduling appointment and a diet clinic.

## 2019-10-08 NOTE — Assessment & Plan Note (Signed)
Recent mission for hypertensive urgency, chest heaviness, and shortness of breath. Her echo did show normal LV systolic function with grade 2 diastolic dysfunction. She was diuresed. She is aware of salt restriction.

## 2019-10-08 NOTE — Patient Instructions (Addendum)
Medication Instructions:  Your physician recommends that you continue on your current medications as directed. Please refer to the Current Medication list given to you today.  *If you need a refill on your cardiac medications before your next appointment, please call your pharmacy*   Lab Work: Your physician recommends that you return for lab work in 2 months:   Fasting Lipid Panel-DO NOT EAT OR DRINK PAST MIDNIGHT. OKAY TO HAVE WATER  Hepatic (Liver) Function   If you have labs (blood work) drawn today and your tests are completely normal, you will receive your results only by: Marland Kitchen MyChart Message (if you have MyChart) OR . A paper copy in the mail If you have any lab test that is abnormal or we need to change your treatment, we will call you to review the results.   Testing/Procedures: Dr. Quay Burow has ordered a CT coronary calcium score. This test is done at [address]. This is $150 out of pocket.   Coronary CalciumScan A coronary calcium scan is an imaging test used to look for deposits of calcium and other fatty materials (plaques) in the inner lining of the blood vessels of the heart (coronary arteries). These deposits of calcium and plaques can partly clog and narrow the coronary arteries without producing any symptoms or warning signs. This puts a person at risk for a heart attack. This test can detect these deposits before symptoms develop. Tell a health care provider about:  Any allergies you have.  All medicines you are taking, including vitamins, herbs, eye drops, creams, and over-the-counter medicines.  Any problems you or family members have had with anesthetic medicines.  Any blood disorders you have.  Any surgeries you have had.  Any medical conditions you have.  Whether you are pregnant or may be pregnant. What are the risks? Generally, this is a safe procedure. However, problems may occur, including:  Harm to a pregnant woman and  her unborn baby. This test involves the use of radiation. Radiation exposure can be dangerous to a pregnant woman and her unborn baby. If you are pregnant, you generally should not have this procedure done.  Slight increase in the risk of cancer. This is because of the radiation involved in the test. What happens before the procedure? No preparation is needed for this procedure. What happens during the procedure?  You will undress and remove any jewelry around your neck or chest.  You will put on a hospital gown.  Sticky electrodes will be placed on your chest. The electrodes will be connected to an electrocardiogram (ECG) machine to record a tracing of the electrical activity of your heart.  A CT scanner will take pictures of your heart. During this time, you will be asked to lie still and hold your breath for 2-3 seconds while a picture of your heart is being taken. The procedure may vary among health care providers and hospitals. What happens after the procedure?  You can get dressed.  You can return to your normal activities.  It is up to you to get the results of your test. Ask your health care provider, or the department that is doing the test, when your results will be ready. Summary  A coronary calcium scan is an imaging test used to look for deposits of calcium and other fatty materials (plaques) in the inner lining of the blood vessels of the heart (coronary arteries).  Generally, this is a safe procedure. Tell your health care provider  if you are pregnant or may be pregnant.  No preparation is needed for this procedure.  A CT scanner will take pictures of your heart.  You can return to your normal activities after the scan is done. This information is not intended to replace advice given to you by your health care provider. Make sure you discuss any questions you have with your health care provider. Document Released: 08/18/2007 Document Revised: 01/09/2016 Document  Reviewed: 01/09/2016 Elsevier Interactive Patient Education  2017 [place]: At [facility], you and your health needs are our priority.  As part of our continuing mission to provide you with exceptional heart care, we have created designated Provider Care Teams.  These Care Teams include your primary Cardiologist (physician) and Advanced Practice Providers (APPs -  Physician Assistants and Nurse Practitioners) who all work together to provide you with the care you need, when you need it.  Your next appointment:   6 month(s)  The format for your next appointment:   In Person  Provider:   Quay Burow, MD  Other Instructions   DASH Eating Plan DASH stands for "Dietary Approaches to Stop Hypertension." The DASH eating plan is a healthy eating plan that has been shown to reduce high blood pressure (hypertension). It may also reduce your risk for type 2 diabetes, heart disease, and stroke. The DASH eating plan may also help with weight loss. What are tips for following this plan?  General guidelines  Avoid eating more than 2,300 mg (milligrams) of salt (sodium) a day. If you have hypertension, you may need to reduce your sodium intake to 1,500 mg a day.  Limit alcohol intake to no more than 1 drink a day for nonpregnant women and 2 drinks a day for men. One drink equals 12 oz of beer, 5 oz of wine, or 1 oz of hard liquor.  Work with your health care provider to maintain a healthy body weight or to lose weight. Ask what an ideal weight is for you.  Get at least 30 minutes of exercise that causes your heart to beat faster (aerobic exercise) most days of the week. Activities may include walking, swimming, or biking.  Work with your health care provider or diet and nutrition specialist (dietitian) to adjust your eating plan to your individual calorie needs. Reading food labels   Check food labels for the amount of sodium per serving. Choose foods with less than 5  percent of the Daily Value of sodium. Generally, foods with less than 300 mg of sodium per serving fit into this eating plan.  To find whole grains, look for the word "whole" as the first word in the ingredient list. Shopping  Buy products labeled as "low-sodium" or "no salt added."  Buy fresh foods. Avoid canned foods and premade or frozen meals. Cooking  Avoid adding salt when cooking. Use salt-free seasonings or herbs instead of table salt or sea salt. Check with your health care provider or pharmacist before using salt substitutes.  Do not fry foods. Cook foods using healthy methods such as baking, boiling, grilling, and broiling instead.  Cook with heart-healthy oils, such as olive, canola, soybean, or sunflower oil. Meal planning  Eat a balanced diet that includes: ? 5 or more servings of fruits and vegetables each day. At each meal, try to fill half of your plate with fruits and vegetables. ? Up to 6-8 servings of whole grains each day. ? Less than 6 oz of lean meat, poultry,  or fish each day. A 3-oz serving of meat is about the same size as a deck of cards. One egg equals 1 oz. ? 2 servings of low-fat dairy each day. ? A serving of nuts, seeds, or beans 5 times each week. ? Heart-healthy fats. Healthy fats called Omega-3 fatty acids are found in foods such as flaxseeds and coldwater fish, like sardines, salmon, and mackerel.  Limit how much you eat of the following: ? Canned or prepackaged foods. ? Food that is high in trans fat, such as fried foods. ? Food that is high in saturated fat, such as fatty meat. ? Sweets, desserts, sugary drinks, and other foods with added sugar. ? Full-fat dairy products.  Do not salt foods before eating.  Try to eat at least 2 vegetarian meals each week.  Eat more home-cooked food and less restaurant, buffet, and fast food.  When eating at a restaurant, ask that your food be prepared with less salt or no salt, if possible. What foods are  recommended? The items listed may not be a complete list. Talk with your dietitian about what dietary choices are best for you. Grains Whole-grain or whole-wheat bread. Whole-grain or whole-wheat pasta. Brown rice. [place]. Bulgur. Whole-grain and low-sodium cereals. Pita bread. Low-fat, low-sodium crackers. Whole-wheat flour tortillas. Vegetables Fresh or frozen vegetables (raw, steamed, roasted, or grilled). Low-sodium or reduced-sodium tomato and vegetable juice. Low-sodium or reduced-sodium tomato sauce and tomato paste. Low-sodium or reduced-sodium canned vegetables. Fruits All fresh, dried, or frozen fruit. Canned fruit in natural juice (without added sugar). Meat and other protein foods Skinless chicken or [place]. Ground chicken or [place]. Pork with fat trimmed off. Fish and seafood. Egg whites. Dried beans, peas, or lentils. Unsalted nuts, nut butters, and seeds. Unsalted canned beans. Lean cuts of beef with fat trimmed off. Low-sodium, lean deli meat. Dairy Low-fat (1%) or fat-free (skim) milk. Fat-free, low-fat, or reduced-fat cheeses. Nonfat, low-sodium ricotta or cottage cheese. Low-fat or nonfat yogurt. Low-fat, low-sodium cheese. Fats and oils Soft margarine without trans fats. Vegetable oil. Low-fat, reduced-fat, or light mayonnaise and salad dressings (reduced-sodium). Canola, safflower, olive, soybean, and sunflower oils. Avocado. Seasoning and other foods Herbs. Spices. Seasoning mixes without salt. Unsalted popcorn and pretzels. Fat-free sweets. What foods are not recommended? The items listed may not be a complete list. Talk with your dietitian about what dietary choices are best for you. Grains Baked goods made with fat, such as croissants, muffins, or some breads. Dry pasta or rice meal packs. Vegetables Creamed or fried vegetables. Vegetables in a cheese sauce. Regular canned vegetables (not low-sodium or reduced-sodium). Regular canned tomato sauce and paste (not  low-sodium or reduced-sodium). Regular tomato and vegetable juice (not low-sodium or reduced-sodium). Angie Fava. Olives. Fruits Canned fruit in a light or heavy syrup. Fried fruit. Fruit in cream or butter sauce. Meat and other protein foods Fatty cuts of meat. Ribs. Fried meat. Berniece Salines. Sausage. Bologna and other processed lunch meats. Salami. Fatback. Hotdogs. Bratwurst. Salted nuts and seeds. Canned beans with added salt. Canned or smoked fish. Whole eggs or egg yolks. Chicken or [place] with skin. Dairy Whole or 2% milk, cream, and half-and-half. Whole or full-fat cream cheese. Whole-fat or sweetened yogurt. Full-fat cheese. Nondairy creamers. Whipped toppings. Processed cheese and cheese spreads. Fats and oils Butter. Stick margarine. Lard. Shortening. Ghee. Bacon fat. Tropical oils, such as coconut, palm kernel, or palm oil. Seasoning and other foods Salted popcorn and pretzels. Onion salt, garlic salt, seasoned salt, table salt, and sea  salt. Worcestershire sauce. Tartar sauce. Barbecue sauce. Teriyaki sauce. Soy sauce, including reduced-sodium. Steak sauce. Canned and packaged gravies. Fish sauce. Oyster sauce. Cocktail sauce. Horseradish that you find on the shelf. Ketchup. Mustard. Meat flavorings and tenderizers. Bouillon cubes. Hot sauce and Tabasco sauce. Premade or packaged marinades. Premade or packaged taco seasonings. Relishes. Regular salad dressings. Where to find more information:  National Heart, Lung, and [place]: https://Barc[employer]-eaton.com/  American Heart Association: www.heart.org Summary  The DASH eating plan is a healthy eating plan that has been shown to reduce high blood pressure (hypertension). It may also reduce your risk for type 2 diabetes, heart disease, and stroke.  With the DASH eating plan, you should limit salt (sodium) intake to 2,300 mg a day. If you have hypertension, you may need to reduce your sodium intake to 1,500 mg a day.  When on the DASH eating plan,  aim to eat more fresh fruits and vegetables, whole grains, lean proteins, low-fat dairy, and heart-healthy fats.  Work with your health care provider or diet and nutrition specialist (dietitian) to adjust your eating plan to your individual calorie needs. This information is not intended to replace advice given to you by your health care provider. Make sure you discuss any questions you have with your health care provider. Document Revised: 02/01/2017 Document Reviewed: 02/13/2016 Elsevier Patient Education  2020 [employer].

## 2019-10-08 NOTE — Assessment & Plan Note (Signed)
History of essential hypertension a blood pressure measured today 134/86. She does measure her blood pressure at home and is it is in the therapeutic range. She is on Edarbi chlor and amlodipine.

## 2019-10-08 NOTE — Assessment & Plan Note (Signed)
Body habitus and symptoms consistent with obstructive sleep apnea. She is being evaluated by neurologist for this. I think she would benefit from CPAP.

## 2019-10-08 NOTE — Assessment & Plan Note (Signed)
History of hyperlipidemia on Crestor which she was not taking as reflected by her most recent lipid profile performed 09/18/19 revealing total cholesterol 236, LDL of 176 and HDL of 47. We will recheck a lipid liver profile in 2 months

## 2019-10-23 ENCOUNTER — Inpatient Hospital Stay: Admission: RE | Admit: 2019-10-23 | Payer: 59 | Source: Ambulatory Visit

## 2019-11-03 ENCOUNTER — Telehealth: Payer: Self-pay

## 2019-11-03 NOTE — Telephone Encounter (Signed)
The pt left a message that she wanted to cancel her appt for this Friday for a covid vaccination because she was able to get her first dose of Omega covid vaccination yesterday.

## 2019-11-03 NOTE — Telephone Encounter (Signed)
error 

## 2019-11-06 ENCOUNTER — Ambulatory Visit: Payer: 59

## 2019-11-12 ENCOUNTER — Telehealth: Payer: Self-pay

## 2019-11-12 NOTE — Telephone Encounter (Signed)
I returned the pt's call to see how many days that the pt's blood pressure has been running high, to get the readings, and see if she was using her bp monitor that she has at home because the pt left a message that her pt has been running between 160 and 165 on the top, that she wanted an appt to see if her medications needed to be changed.

## 2019-11-20 ENCOUNTER — Inpatient Hospital Stay: Admission: RE | Admit: 2019-11-20 | Payer: 59 | Source: Ambulatory Visit

## 2019-12-17 ENCOUNTER — Ambulatory Visit: Payer: 59 | Admitting: Internal Medicine

## 2019-12-31 ENCOUNTER — Encounter: Payer: Self-pay | Admitting: Nurse Practitioner

## 2019-12-31 ENCOUNTER — Ambulatory Visit: Payer: 59 | Admitting: Nurse Practitioner

## 2019-12-31 ENCOUNTER — Other Ambulatory Visit: Payer: Self-pay

## 2019-12-31 VITALS — BP 122/78 | HR 64 | Temp 98.3°F | Ht 69.0 in | Wt 336.8 lb

## 2019-12-31 DIAGNOSIS — F419 Anxiety disorder, unspecified: Secondary | ICD-10-CM

## 2019-12-31 DIAGNOSIS — E782 Mixed hyperlipidemia: Secondary | ICD-10-CM

## 2019-12-31 DIAGNOSIS — E559 Vitamin D deficiency, unspecified: Secondary | ICD-10-CM

## 2019-12-31 DIAGNOSIS — I10 Essential (primary) hypertension: Secondary | ICD-10-CM

## 2019-12-31 MED ORDER — BUSPIRONE HCL 7.5 MG PO TABS: 7.5000 mg | ORAL_TABLET | Freq: Two times a day (BID) | ORAL | 2 refills | Status: DC

## 2019-12-31 MED ORDER — VITAMIN D (ERGOCALCIFEROL) 1.25 MG (50000 UNIT) PO CAPS: 50000.0000 [IU] | ORAL_CAPSULE | ORAL | 1 refills | Status: DC

## 2019-12-31 MED ORDER — EDARBYCLOR 40-25 MG PO TABS: 1.0000 | ORAL_TABLET | Freq: Every day | ORAL | 0 refills | Status: DC

## 2019-12-31 NOTE — Patient Instructions (Signed)

## 2019-12-31 NOTE — Progress Notes (Signed)
I,Yamilka Roman [employer] as a Education administrator for [employer], FNP.,have documented all relevant documentation on the behalf of [name], FNP,as directed by  [name], FNP while in the presence of [name], [place]. This visit occurred during the SARS-CoV-2 public health emergency.  Safety protocols were in place, including screening questions prior to the visit, additional usage of staff PPE, and extensive cleaning of exam room while observing appropriate contact time as indicated for disinfecting solutions.  Subjective:     Patient ID: Pamela [name] , female    DOB: August 24, 1965 , 54 y.o.   MRN: [id number]   Chief Complaint  Patient presents with  . Hypertension    HPI  While on her way to a home visit last week while in a car she felt like she was confined space, this is occurring more often. Her coworker had to pull over to allow her to catch her breath.  She is feeling like it is difficult to navigate throughout the day.  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   She had fractured her elbow during the summer after having a cast place, she took the cast off because she felt really confined.    Hypertension This is a chronic problem. The current episode started more than 1 year ago. The problem has been gradually improving since onset. The problem is controlled. Pertinent negatives include no anxiety, blurred vision or chest pain. There are no associated agents to hypertension. Risk factors for coronary artery disease include obesity and sedentary lifestyle. Past treatments include diuretics and angiotensin blockers. There are no compliance problems.  There is no history of angina. There is no history of chronic renal disease.  Anxiety Presents for initial visit. Onset was 1 to 4 weeks ago (she has had these in the past). Symptoms include nervous/anxious behavior (she has been having episodes ). Patient reports no chest pain.       Past Medical History:  Diagnosis  Date  . Asthma   . DUB (dysfunctional uterine bleeding) 2007  . Elevated cholesterol   . H/O menorrhagia 05/01/2006  . History of ovarian cyst 2007  . Hypertension 03/31/2005  . Increased BMI 07/11/2006  . Migraine   . N&V (nausea and vomiting)   . Obesity 2007  . Obstructive sleep apnea syndrome, moderate 10/27/2013  . Weight loss 07/11/2006     Family History  Problem Relation Age of Onset  . Diabetes Maternal Grandmother   . Hypertension Mother   . Hypertension Father   . Heart attack Father      Current Outpatient Medications:  .  albuterol (VENTOLIN HFA) 108 (90 Base) MCG/ACT inhaler, Inhale 2 puffs into the lungs every 6 (six) hours as needed for wheezing or shortness of breath., Disp: 1 Inhaler, Rfl: 2 .  amLODipine (NORVASC) 10 MG tablet, Take 1 tablet (10 mg total) by mouth daily., Disp: 90 tablet, Rfl: 2 .  aspirin EC 81 MG tablet, Take 1 tablet (81 mg total) by mouth daily. Swallow whole., Disp: 30 tablet, Rfl: 11 .  Azilsartan-Chlorthalidone (EDARBYCLOR) 40-25 MG TABS, Take 1 tablet by mouth daily., Disp: 30 tablet, Rfl: 0 .  rosuvastatin (CRESTOR) 20 MG tablet, TAKE 1 TABLET IN THE P.M., Disp: 90 tablet, Rfl: 2 .  Vitamin D, Ergocalciferol, (DRISDOL) 1.25 MG (50000 UNIT) CAPS capsule, Take 1 capsule (50,000 Units total) by mouth every 7 (seven) days., Disp: 24 capsule, Rfl: 1 .  busPIRone (BUSPAR) 7.5 MG tablet, Take 1 tablet (  7.5 mg total) by mouth 2 (two) times daily., Disp: 60 tablet, Rfl: 2   Allergies  Allergen Reactions  . Pollen Extract Shortness Of Breath  . Latex Rash  . Codeine Hives  . Shellfish Allergy Hives  . Betadine [Povidone Iodine] Rash     Review of Systems  Constitutional: Negative.   HENT: Negative.   Eyes: Negative.  Negative for blurred vision.  Cardiovascular: Negative.  Negative for chest pain.  Genitourinary: Negative.   Skin: Negative.   Neurological: Negative.   Hematological: Negative.   Psychiatric/Behavioral: The patient is  nervous/anxious (she has been having episodes ).      Today's Vitals   12/31/19 0958  BP: 122/78  Pulse: 64  Temp: 98.3 F (36.8 C)  TempSrc: Oral  Weight: (!) 336 lb 12.8 oz (152.8 kg)  Height: 5' 9"  (1.753 m)  PainSc: 0-No pain   Body mass index is 49.74 kg/m.   Objective:  Physical Exam Vitals reviewed.  Constitutional:      General: She is not in acute distress.    Appearance: Normal appearance. She is well-developed. She is obese.  HENT:     Head: Normocephalic and atraumatic.  Eyes:     Pupils: Pupils are equal, round, and reactive to light.  Cardiovascular:     Rate and Rhythm: Normal rate and regular rhythm.     Pulses: Normal pulses.     Heart sounds: Normal heart sounds. No murmur heard.   Pulmonary:     Effort: Pulmonary effort is normal. No respiratory distress.     Breath sounds: Normal breath sounds. No wheezing.  Musculoskeletal:        General: Normal range of motion.  Skin:    General: Skin is warm and dry.     Capillary Refill: Capillary refill takes less than 2 seconds.  Neurological:     General: No focal deficit present.     Mental Status: She is alert and oriented to person, place, and time.     Cranial Nerves: No cranial nerve deficit.     Motor: No weakness.  Psychiatric:        Mood and Affect: Mood normal.        Behavior: Behavior normal.        Thought Content: Thought content normal.        Judgment: Judgment normal.         Assessment And Plan:     1. Essential hypertension . B/P is controlled.  . CMP ordered to check renal function.  . The importance of regular exercise and dietary modification was stressed to the patient.  - BMP8+eGFR - Azilsartan-Chlorthalidone (EDARBYCLOR) 40-25 MG TABS; Take 1 tablet by mouth daily.  Dispense: 30 tablet; Refill: 0  2. Anxiety  Will try her on buspirone daily in which she can go up to two times a day after 2 weeks.  She is also going to check into her EAP at work  She will follow up  in 4-6 weeks - busPIRone (BUSPAR) 7.5 MG tablet; Take 1 tablet (7.5 mg total) by mouth 2 (two) times daily.  Dispense: 60 tablet; Refill: 2  3. Mixed hyperlipidemia  Chronic, controlled  Continue with current medications, tolerating well.  4. Vitamin D deficiency disease  Will check vitamin D level and supplement as needed.     Also encouraged to spend 15 minutes in the sun daily.  - Vitamin D, Ergocalciferol, (DRISDOL) 1.25 MG (50000 UNIT) CAPS capsule; Take 1 capsule (50,000  Units total) by mouth every 7 (seven) days.  Dispense: 24 capsule; Refill: 1     Patient was given opportunity to ask questions. Patient verbalized understanding of the plan and was able to repeat key elements of the plan. All questions were answered to their satisfaction.    Teola Bradley, FNP, have reviewed all documentation for this visit. The documentation on 01/03/20 for the exam, diagnosis, procedures, and orders are all accurate and complete.  THE PATIENT IS ENCOURAGED TO PRACTICE SOCIAL DISTANCING DUE TO THE COVID-19 PANDEMIC.

## 2020-01-06 ENCOUNTER — Telehealth: Payer: Self-pay

## 2020-01-06 ENCOUNTER — Ambulatory Visit
Admission: EM | Admit: 2020-01-06 | Discharge: 2020-01-06 | Disposition: A | Discharge status: Home / Self Care | Payer: 59 | Attending: Physician Assistant | Admitting: Physician Assistant

## 2020-01-06 ENCOUNTER — Encounter: Payer: Self-pay | Admitting: Emergency Medicine

## 2020-01-06 ENCOUNTER — Other Ambulatory Visit: Payer: Self-pay

## 2020-01-06 DIAGNOSIS — K0889 Other specified disorders of teeth and supporting structures: Secondary | ICD-10-CM

## 2020-01-06 MED ORDER — CHLORHEXIDINE GLUCONATE 0.12 % MT SOLN
10.0000 mL | Freq: Two times a day (BID) | OROMUCOSAL | 0 refills | Status: DC
Start: 2020-01-06 — End: 2020-06-09

## 2020-01-06 MED ORDER — AMOXICILLIN-POT CLAVULANATE 875-125 MG PO TABS
1.0000 | ORAL_TABLET | Freq: Two times a day (BID) | ORAL | 0 refills | Status: DC
Start: 2020-01-06 — End: 2020-03-08

## 2020-01-06 MED ORDER — IBUPROFEN 800 MG PO TABS
800.0000 mg | ORAL_TABLET | Freq: Three times a day (TID) | ORAL | 0 refills | Status: DC
Start: 2020-01-06 — End: 2020-06-21

## 2020-01-06 MED ORDER — FLUCONAZOLE 150 MG PO TABS
150.0000 mg | ORAL_TABLET | Freq: Every day | ORAL | 0 refills | Status: DC
Start: 2020-01-06 — End: 2020-05-26

## 2020-01-06 NOTE — ED Triage Notes (Signed)
PT reports lower central dental abscess for 2 days.

## 2020-01-06 NOTE — Discharge Instructions (Signed)
Start Augmentin as directed for dental infection. Ibuprofen/tylenol for pain. Peridex for dental hygiene. Follow up with dentist for further treatment and evaluation. If experiencing swelling of the throat, trouble breathing, trouble swallowing, leaning forward to breath, drooling, go to the emergency department for further evaluation.   Diflucan to prevent yeast.

## 2020-01-06 NOTE — ED Provider Notes (Signed)
[facility]    CSN: [id number] Arrival date & time: 01/06/20  1727      History   Chief Complaint Chief Complaint  Patient presents with  . Dental Pain    HPI Pamela [name] is a 54 y.o. female.   54 year old female comes in for 2 day history of dental pain. Patient has plaques to the teeth with crack to the lower central incisor that has been present for few months. 2 days ago, area started becoming painful with swelling. Denies fever, trouble swallowing.      Past Medical History:  Diagnosis Date  . Asthma   . DUB (dysfunctional uterine bleeding) 2007  . Elevated cholesterol   . H/O menorrhagia 05/01/2006  . History of ovarian cyst 2007  . Hypertension 03/31/2005  . Increased BMI 07/11/2006  . Migraine   . N&V (nausea and vomiting)   . Obesity 2007  . Obstructive sleep apnea syndrome, moderate 10/27/2013  . Weight loss 07/11/2006    Patient Active Problem List   Diagnosis Date Noted  . Acute on chronic diastolic heart failure ([place]) 10/08/2019  . Elevated troponin level not due myocardial infarction 09/18/2019  . Mixed hyperlipidemia 09/18/2019  . Hypertensive emergency 09/17/2019  . Primary osteoarthritis of right hip 08/06/2018  . Seasonal allergies 06/12/2018  . Fibrocystic disease of breast 05/27/2018  . Obstructive sleep apnea syndrome, moderate 10/27/2013  . Shift work sleep disorder 10/27/2013  . Psychic factors associated with diseases classified elsewhere 08/07/2013  . High cholesterol 07/21/2013  . HTN (hypertension) 07/21/2013  . Snoring 07/21/2013  . IUD contraception-inserted 10/01/11 10/01/2011  . Class 3 severe obesity due to excess calories with serious comorbidity and body mass index (BMI) of 45.0 to 49.9 in adult [facility] 09/03/2011    Past Surgical History:  Procedure Laterality Date  . BREAST REDUCTION SURGERY  05/1991  . CESAREAN SECTION    . HYSTEROSCOPY  05/01/2006    OB History    Gravida  4   Para  2   Term  1    Preterm  1   AB  2   Living  2     SAB  1   TAB      Ectopic      Multiple      Live Births  2            Home Medications    Prior to Admission medications   Medication Sig Start Date End Date Taking? Authorizing Provider  albuterol (VENTOLIN HFA) 108 (90 Base) MCG/ACT inhaler Inhale 2 puffs into the lungs every 6 (six) hours as needed for wheezing or shortness of breath. 06/25/18  Yes [name], MD  amLODipine (NORVASC) 10 MG tablet Take 1 tablet (10 mg total) by mouth daily. 06/17/19  Yes [name], MD  aspirin EC 81 MG tablet Take 1 tablet (81 mg total) by mouth daily. Swallow whole. 09/18/19  Yes Elgergawy, Silver Huguenin, MD  Azilsartan-Chlorthalidone (EDARBYCLOR) 40-25 MG TABS Take 1 tablet by mouth daily. 12/31/19  Yes [name], FNP  rosuvastatin (CRESTOR) 20 MG tablet TAKE 1 TABLET IN THE P.M. 09/22/19  Yes [name], MD  Vitamin D, Ergocalciferol, (DRISDOL) 1.25 MG (50000 UNIT) CAPS capsule Take 1 capsule (50,000 Units total) by mouth every 7 (seven) days. 12/31/19  Yes [name], FNP  amoxicillin-clavulanate (AUGMENTIN) 875-125 MG tablet Take 1 tablet by mouth every 12 (twelve) hours. 01/06/20   Tasia Catchings, Zeferino Mounts V, PA-C  busPIRone (BUSPAR) 7.5  MG tablet Take 1 tablet (7.5 mg total) by mouth 2 (two) times daily. 12/31/19   [name], FNP  chlorhexidine (PERIDEX) 0.12 % solution Use as directed 10 mLs in the mouth or throat 2 (two) times daily. 01/06/20   Tasia Catchings, Hatsumi Steinhart V, PA-C  fluconazole (DIFLUCAN) 150 MG tablet Take 1 tablet (150 mg total) by mouth daily. Take second dose 72 hours later if symptoms still persists. 01/06/20   Tasia Catchings, Claron Rosencrans V, PA-C  ibuprofen (ADVIL) 800 MG tablet Take 1 tablet (800 mg total) by mouth 3 (three) times daily. 01/06/20   Ok Edwards, PA-C    Family History Family History  Problem Relation Age of Onset  . Diabetes Maternal Grandmother   . Hypertension Mother   . Hypertension Father   . Heart attack Father     Social History Social  History   Tobacco Use  . Smoking status: Never Smoker  . Smokeless tobacco: Never Used  Vaping Use  . Vaping Use: Never used  Substance Use Topics  . Alcohol use: No  . Drug use: Not Currently    Frequency: 2.0 times per week     Allergies   Pollen extract, Latex, Codeine, Shellfish allergy, and Betadine [povidone iodine]   Review of Systems Review of Systems  Reason unable to perform ROS: See HPI as above.     Physical Exam Triage Vital Signs ED Triage Vitals  Enc Vitals Group     BP 01/06/20 1738 (!) 141/83     Pulse Rate 01/06/20 1738 86     Resp 01/06/20 1738 18     Temp 01/06/20 1738 98.5 F (36.9 C)     Temp Source 01/06/20 1738 Oral     SpO2 01/06/20 1738 97 %     Weight --      Height --      Head Circumference --      Peak Flow --      Pain Score 01/06/20 1746 9     Pain Loc --      Pain Edu? --      Excl. in [place]? --    No data found.  Updated Vital Signs BP (!) 141/83 (BP Location: Left Arm)   Pulse 86   Temp 98.5 F (36.9 C) (Oral)   Resp 18   SpO2 97%   Physical Exam Constitutional:      General: She is not in acute distress.    Appearance: Normal appearance. She is well-developed. She is not ill-appearing, toxic-appearing or diaphoretic.  HENT:     Head: Normocephalic and atraumatic.     Jaw: No trismus.     Mouth/Throat:     Mouth: Mucous membranes are moist.     Pharynx: Oropharynx is clear. Uvula midline. No uvula swelling.     Tonsils: No tonsillar exudate.     Comments: Diffuse plaque build up with cracking of the plaque between lower central incisors. Swelling to the gum without fluctuance.   Floor of mouth soft to palpation. No facial swelling.  Eyes:     Conjunctiva/sclera: Conjunctivae normal.     Pupils: Pupils are equal, round, and reactive to light.  Pulmonary:     Effort: Pulmonary effort is normal. No respiratory distress.  Musculoskeletal:     Cervical back: Normal range of motion and neck supple.  Skin:     General: Skin is warm and dry.  Neurological:     Mental Status: She is alert and oriented to person, place,  and time.      UC Treatments / Results  Labs (all labs ordered are listed, but only abnormal results are displayed) Labs Reviewed - No data to display  EKG   Radiology No results found.  Procedures Procedures (including critical care time)  Medications Ordered in UC Medications - No data to display  Initial Impression / Assessment and Plan / UC Course  I have reviewed the triage vital signs and the nursing notes.  Pertinent labs & imaging results that were available during my care of the patient were reviewed by me and considered in my medical decision making (see chart for details).    Start antibiotics for possible dental infection. Symptomatic treatment as needed. Discussed with patient symptoms can return if dental problem is not addressed. Follow up with dentist for further evaluation and treatment of dental pain. Resources given. Return precautions given.   Final Clinical Impressions(s) / UC Diagnoses   Final diagnoses:  Pain, dental    ED Prescriptions    Medication Sig Dispense Auth. Provider   amoxicillin-clavulanate (AUGMENTIN) 875-125 MG tablet Take 1 tablet by mouth every 12 (twelve) hours. 14 tablet Kingsley Herandez V, PA-C   fluconazole (DIFLUCAN) 150 MG tablet Take 1 tablet (150 mg total) by mouth daily. Take second dose 72 hours later if symptoms still persists. 2 tablet Zidan Helget V, PA-C   ibuprofen (ADVIL) 800 MG tablet Take 1 tablet (800 mg total) by mouth 3 (three) times daily. 21 tablet Markeese Boyajian V, PA-C   chlorhexidine (PERIDEX) 0.12 % solution Use as directed 10 mLs in the mouth or throat 2 (two) times daily. 240 mL Tasia Catchings, Eulanda Dorion V, PA-C     I have reviewed the PDMP during this encounter.   Ok Edwards, PA-C 01/07/20 2200

## 2020-01-06 NOTE — Telephone Encounter (Signed)
The pt called and requested an antibiotic because her dentist had to reschedule her appt and put her on a high priority list.  The pt was told that Dr. Baird Cancer said she could send in a mediation for pain and that the pt would need to come in tomorrow to be evaluated for an antibiotic. The pt said that the strongest thing she takes for pain is a tylenol and that she will go to urgent care.

## 2020-02-02 LAB — HM PAP SMEAR: HM Pap smear: POSITIVE

## 2020-02-03 ENCOUNTER — Encounter: Payer: Self-pay | Admitting: Internal Medicine

## 2020-02-04 ENCOUNTER — Ambulatory Visit: Payer: 59 | Admitting: Nurse Practitioner

## 2020-02-22 ENCOUNTER — Emergency Department (HOSPITAL_COMMUNITY): Payer: 59

## 2020-02-22 ENCOUNTER — Ambulatory Visit: Payer: 59 | Admitting: Nurse Practitioner

## 2020-02-22 ENCOUNTER — Telehealth: Payer: Self-pay

## 2020-02-22 ENCOUNTER — Other Ambulatory Visit: Payer: Self-pay

## 2020-02-22 ENCOUNTER — Encounter (HOSPITAL_COMMUNITY): Payer: Self-pay | Admitting: Pediatrics

## 2020-02-22 ENCOUNTER — Emergency Department (HOSPITAL_COMMUNITY)
Admission: EM | Admit: 2020-02-22 | Discharge: 2020-02-23 | Disposition: A | Discharge status: Home / Self Care | Payer: 59 | Attending: Emergency Medicine | Admitting: Emergency Medicine

## 2020-02-22 DIAGNOSIS — I110 Hypertensive heart disease with heart failure: Secondary | ICD-10-CM | POA: Insufficient documentation

## 2020-02-22 DIAGNOSIS — J45909 Unspecified asthma, uncomplicated: Secondary | ICD-10-CM | POA: Diagnosis not present

## 2020-02-22 DIAGNOSIS — Z7951 Long term (current) use of inhaled steroids: Secondary | ICD-10-CM | POA: Insufficient documentation

## 2020-02-22 DIAGNOSIS — Z91040 Latex allergy status: Secondary | ICD-10-CM | POA: Insufficient documentation

## 2020-02-22 DIAGNOSIS — Z79899 Other long term (current) drug therapy: Secondary | ICD-10-CM | POA: Diagnosis not present

## 2020-02-22 DIAGNOSIS — Z7982 Long term (current) use of aspirin: Secondary | ICD-10-CM | POA: Diagnosis not present

## 2020-02-22 DIAGNOSIS — R0789 Other chest pain: Secondary | ICD-10-CM

## 2020-02-22 DIAGNOSIS — R0602 Shortness of breath: Secondary | ICD-10-CM | POA: Diagnosis not present

## 2020-02-22 DIAGNOSIS — I5033 Acute on chronic diastolic (congestive) heart failure: Secondary | ICD-10-CM | POA: Diagnosis not present

## 2020-02-22 LAB — BASIC METABOLIC PANEL
Anion gap: 11 (ref 5–15)
BUN: 9 mg/dL (ref 6–20)
CO2: 27 mmol/L (ref 22–32)
Calcium: 9.8 mg/dL (ref 8.9–10.3)
Chloride: 101 mmol/L (ref 98–111)
Creatinine, Ser: 0.89 mg/dL (ref 0.44–1.00)
GFR, Estimated: >60 mL/min (ref >60)
Glucose, Bld: 119 mg/dL — ABNORMAL HIGH (ref 70–99)
Potassium: 4 mmol/L (ref 3.5–5.1)
Sodium: 139 mmol/L (ref 135–145)

## 2020-02-22 LAB — CBC
HCT: 37.9 % (ref 36.0–46.0)
Hemoglobin: 11.7 g/dL — ABNORMAL LOW (ref 12.0–15.0)
MCH: 26.8 pg (ref 26.0–34.0)
MCHC: 30.9 g/dL (ref 30.0–36.0)
MCV: 86.7 fL (ref 80.0–100.0)
Platelets: 239 10*3/uL (ref 150–400)
RBC: 4.37 MIL/uL (ref 3.87–5.11)
RDW: 13.9 % (ref 11.5–15.5)
WBC: 8.9 10*3/uL (ref 4.0–10.5)
nRBC: 0 % (ref 0.0–0.2)

## 2020-02-22 LAB — TROPONIN I (HIGH SENSITIVITY)
Troponin I (High Sensitivity): 8 ng/L (ref <18)
Troponin I (High Sensitivity): 10 ng/L (ref <18)

## 2020-02-22 LAB — I-STAT BETA HCG BLOOD, ED (MC, WL, AP ONLY): I-stat hCG, quantitative: <5 m[IU]/mL (ref <5)

## 2020-02-22 NOTE — Telephone Encounter (Signed)
Patient called stating she would not be able to come for her appointment today due to work so she would like to reschedule. I was unable to leave pt vm YL,RMA

## 2020-02-22 NOTE — ED Triage Notes (Signed)
C/O chest pain and shortness of breath since Friday.

## 2020-02-23 LAB — BRAIN NATRIURETIC PEPTIDE: B Natriuretic Peptide: 20.2 pg/mL (ref 0.0–100.0)

## 2020-02-23 LAB — D-DIMER, QUANTITATIVE: D-Dimer, Quant: <0.27 ug/mL-FEU (ref 0.00–0.50)

## 2020-02-23 MED ORDER — HYDROXYZINE HCL 25 MG PO TABS: 25.0000 mg | ORAL_TABLET | Freq: Four times a day (QID) | ORAL | 0 refills | Status: DC | PRN

## 2020-02-23 NOTE — ED Provider Notes (Signed)
[facility] EMERGENCY DEPARTMENT Provider Note   CSN: [id number] Arrival date & time: 02/22/20  1451     History Chief Complaint  Patient presents with  . Chest Pain    Pamela [name] is a 54 y.o. female.  Patient presents to the emergency department with complaints of 3 days of shortness of breath.  Patient reports that she has had some intermittent heaviness in her chest as well.  She has noticed decreased ability to walk because of her shortness of breath.  She has not had any cough or URI symptoms.  She does have a history of asthma but very rarely uses her inhaler.  She tried her inhaler today and it did not help.  Patient has also been feeling very anxious.  She is not sure if her symptoms are from anxiety or something else.  She reports that she was in the ER several months ago with similar and was referred to cardiology.  She was seen in the office but did not follow-up for the cardiac CT that was ordered.        Past Medical History:  Diagnosis Date  . Asthma   . DUB (dysfunctional uterine bleeding) 2007  . Elevated cholesterol   . H/O menorrhagia 05/01/2006  . History of ovarian cyst 2007  . Hypertension 03/31/2005  . Increased BMI 07/11/2006  . Migraine   . N&V (nausea and vomiting)   . Obesity 2007  . Obstructive sleep apnea syndrome, moderate 10/27/2013  . Weight loss 07/11/2006    Patient Active Problem List   Diagnosis Date Noted  . Acute on chronic diastolic heart failure ([place]) 10/08/2019  . Elevated troponin level not due myocardial infarction 09/18/2019  . Mixed hyperlipidemia 09/18/2019  . Hypertensive emergency 09/17/2019  . Primary osteoarthritis of right hip 08/06/2018  . Seasonal allergies 06/12/2018  . Fibrocystic disease of breast 05/27/2018  . Obstructive sleep apnea syndrome, moderate 10/27/2013  . Shift work sleep disorder 10/27/2013  . Psychic factors associated with diseases classified elsewhere 08/07/2013  . High  cholesterol 07/21/2013  . HTN (hypertension) 07/21/2013  . Snoring 07/21/2013  . IUD contraception-inserted 10/01/11 10/01/2011  . Class 3 severe obesity due to excess calories with serious comorbidity and body mass index (BMI) of 45.0 to 49.9 in adult [facility]) 09/03/2011    Past Surgical History:  Procedure Laterality Date  . BREAST REDUCTION SURGERY  05/1991  . CESAREAN SECTION    . HYSTEROSCOPY  05/01/2006     OB History    Gravida  4   Para  2   Term  1   Preterm  1   AB  2   Living  2     SAB  1   IAB      Ectopic      Multiple      Live Births  2           Family History  Problem Relation Age of Onset  . Diabetes Maternal Grandmother   . Hypertension Mother   . Hypertension Father   . Heart attack Father     Social History   Tobacco Use  . Smoking status: Never Smoker  . Smokeless tobacco: Never Used  Vaping Use  . Vaping Use: Never used  Substance Use Topics  . Alcohol use: No  . Drug use: Not Currently    Frequency: 2.0 times per week    Home Medications Prior to Admission medications   Medication Sig Start Date  End Date Taking? Authorizing Provider  albuterol (VENTOLIN HFA) 108 (90 Base) MCG/ACT inhaler Inhale 2 puffs into the lungs every 6 (six) hours as needed for wheezing or shortness of breath. 06/25/18   [name], MD  amLODipine (NORVASC) 10 MG tablet Take 1 tablet (10 mg total) by mouth daily. 06/17/19   [name], MD  amoxicillin-clavulanate (AUGMENTIN) 875-125 MG tablet Take 1 tablet by mouth every 12 (twelve) hours. 01/06/20   Ok Edwards, PA-C  aspirin EC 81 MG tablet Take 1 tablet (81 mg total) by mouth daily. Swallow whole. 09/18/19   Elgergawy, Silver Huguenin, MD  Azilsartan-Chlorthalidone (EDARBYCLOR) 40-25 MG TABS Take 1 tablet by mouth daily. 12/31/19   [name], FNP  busPIRone (BUSPAR) 7.5 MG tablet Take 1 tablet (7.5 mg total) by mouth 2 (two) times daily. 12/31/19   [name], FNP  chlorhexidine (PERIDEX) 0.12 %  solution Use as directed 10 mLs in the mouth or throat 2 (two) times daily. 01/06/20   Tasia Catchings, Amy V, PA-C  fluconazole (DIFLUCAN) 150 MG tablet Take 1 tablet (150 mg total) by mouth daily. Take second dose 72 hours later if symptoms still persists. 01/06/20   Tasia Catchings, Amy V, PA-C  ibuprofen (ADVIL) 800 MG tablet Take 1 tablet (800 mg total) by mouth 3 (three) times daily. 01/06/20   Tasia Catchings, Amy V, PA-C  rosuvastatin (CRESTOR) 20 MG tablet TAKE 1 TABLET IN THE P.M. 09/22/19   [name], MD  Vitamin D, Ergocalciferol, (DRISDOL) 1.25 MG (50000 UNIT) CAPS capsule Take 1 capsule (50,000 Units total) by mouth every 7 (seven) days. 12/31/19   [name], FNP    Allergies    Pollen extract, Latex, Codeine, Shellfish allergy, and Betadine [povidone iodine]  Review of Systems   Review of Systems  Respiratory: Positive for shortness of breath.   Cardiovascular: Positive for chest pain.  All other systems reviewed and are negative.   Physical Exam Updated Vital Signs BP (!) 122/55   Pulse 78   Temp 97.9 F (36.6 C)   Resp 18   Ht 5\' 9"  (1.753 m)   Wt (!) 146.1 kg   SpO2 100%   BMI 47.55 kg/m   Physical Exam Vitals and nursing note reviewed.  Constitutional:      General: She is not in acute distress.    Appearance: Normal appearance. She is well-developed and well-nourished. She is obese.  HENT:     Head: Normocephalic and atraumatic.     Right Ear: Hearing normal.     Left Ear: Hearing normal.     Nose: Nose normal.     Mouth/Throat:     Mouth: Oropharynx is clear and moist and mucous membranes are normal.  Eyes:     Extraocular Movements: EOM normal.     Conjunctiva/sclera: Conjunctivae normal.     Pupils: Pupils are equal, round, and reactive to light.  Cardiovascular:     Rate and Rhythm: Regular rhythm.     Heart sounds: S1 normal and S2 normal. No murmur heard. No friction rub. No gallop.   Pulmonary:     Effort: Pulmonary effort is normal. No respiratory distress.     Breath  sounds: Normal breath sounds.  Chest:     Chest wall: No tenderness.  Abdominal:     General: Bowel sounds are normal.     Palpations: Abdomen is soft. There is no hepatosplenomegaly.     Tenderness: There is no abdominal tenderness. There is no guarding or rebound. Negative signs  include Murphy's sign and McBurney's sign.     Hernia: No hernia is present.  Musculoskeletal:        General: Normal range of motion.     Cervical back: Normal range of motion and neck supple.  Skin:    General: Skin is warm, dry and intact.     Findings: No rash.     Nails: There is no cyanosis.  Neurological:     Mental Status: She is alert and oriented to person, place, and time.     GCS: GCS eye subscore is 4. GCS verbal subscore is 5. GCS motor subscore is 6.     Cranial Nerves: No cranial nerve deficit.     Sensory: No sensory deficit.     Coordination: Coordination normal.     Deep Tendon Reflexes: Strength normal.  Psychiatric:        Mood and Affect: Mood and affect normal.        Speech: Speech normal.        Behavior: Behavior normal.        Thought Content: Thought content normal.     ED Results / Procedures / Treatments   Labs (all labs ordered are listed, but only abnormal results are displayed) Labs Reviewed  BASIC METABOLIC PANEL - Abnormal; Notable for the following components:      Result Value   Glucose, Bld 119 (*)    All other components within normal limits  CBC - Abnormal; Notable for the following components:   Hemoglobin 11.7 (*)    All other components within normal limits  D-DIMER, QUANTITATIVE (NOT AT [facility])  BRAIN NATRIURETIC PEPTIDE  I-STAT BETA HCG BLOOD, ED ([facility], [facility], [facility] ONLY)  TROPONIN I (HIGH SENSITIVITY)  TROPONIN I (HIGH SENSITIVITY)    EKG EKG Interpretation  Date/Time:  Monday February 22 2020 15:53:32 EST Ventricular Rate:  90 PR Interval:  182 QRS Duration: 90 QT Interval:  362 QTC Calculation: 442 R Axis:   97 Text Interpretation: Normal sinus  rhythm Rightward axis Cannot rule out Anterior infarct , age undetermined Abnormal ECG No significant change since last tracing Confirmed by Orpah Greek [phone]) on 02/23/2020 1:36:10 AM   Radiology DG Chest 2 View  Result Date: 02/22/2020 CLINICAL DATA:  Chest pain EXAM: CHEST - 2 VIEW COMPARISON:  09/17/2019 FINDINGS: Upper normal heart size. Mediastinal contours and pulmonary vascularity normal. Minimal atherosclerotic calcification aorta. Minimal chronic accentuation of RIGHT basilar markings, stable. Lungs otherwise clear. No acute infiltrate, pleural effusion or pneumothorax. Osseous structures unremarkable. IMPRESSION: No acute abnormalities. Electronically Signed   By: Lavonia Dana M.D.   On: 02/22/2020 15:44    Procedures Procedures (including critical care time)  Medications Ordered in ED Medications - No data to display  ED Course  I have reviewed the triage vital signs and the nursing notes.  Pertinent labs & imaging results that were available during my care of the patient were reviewed by me and considered in my medical decision making (see chart for details).    MDM Rules/Calculators/A&P                          Patient presents to the emergency department for evaluation of chest pain which is described as an intermittent tightness associated with shortness of breath.  Reviewing her records reveals a hospitalization in July for hypertensive crisis with slightly elevated troponins not felt to be consistent with acute coronary syndrome.  Echo showed grade 2  diastolic dysfunction during hospital stay.  She was started on new medications and has done well.  She did follow-up with cardiology on August 5 and was doing well at that time.  Patient symptoms do not seem typical for cardiac etiology today.  EKG unchanged from prior.  Troponins negative x2.  D-dimer normal.  No specific DVT risk factors.  She does not appear volume overloaded today.  Patient is very anxious.   She was given BuSpar recently for anxiety but did not like the way it made her feel and only took it for a couple of days.  We will give her Vistaril to be used as needed for anxiety and is to follow-up with primary care and cardiology.   Final Clinical Impression(s) / ED Diagnoses Final diagnoses:  Atypical chest pain  Shortness of breath    Rx / DC Orders ED Discharge Orders    None       Darrly Loberg, Gwenyth Allegra, MD 02/23/20 [phone]

## 2020-03-04 ENCOUNTER — Ambulatory Visit
Admission: EM | Admit: 2020-03-04 | Discharge: 2020-03-04 | Disposition: A | Discharge status: Home / Self Care | Payer: 59

## 2020-03-04 ENCOUNTER — Other Ambulatory Visit: Payer: Self-pay

## 2020-03-08 ENCOUNTER — Ambulatory Visit
Admission: EM | Admit: 2020-03-08 | Discharge: 2020-03-08 | Disposition: A | Discharge status: Home / Self Care | Payer: 59 | Attending: Emergency Medicine | Admitting: Emergency Medicine

## 2020-03-08 ENCOUNTER — Other Ambulatory Visit: Payer: Self-pay

## 2020-03-08 DIAGNOSIS — Z20822 Contact with and (suspected) exposure to covid-19: Secondary | ICD-10-CM | POA: Diagnosis not present

## 2020-03-08 DIAGNOSIS — J069 Acute upper respiratory infection, unspecified: Secondary | ICD-10-CM

## 2020-03-08 MED ORDER — HYDROXYZINE HCL 25 MG PO TABS: 25.0000 mg | ORAL_TABLET | Freq: Four times a day (QID) | ORAL | 0 refills | Status: DC | PRN

## 2020-03-08 MED ORDER — DM-GUAIFENESIN ER 30-600 MG PO TB12: 1.0000 | ORAL_TABLET | Freq: Two times a day (BID) | ORAL | 0 refills | Status: DC

## 2020-03-08 MED ORDER — BENZONATATE 200 MG PO CAPS: 200.0000 mg | ORAL_CAPSULE | Freq: Three times a day (TID) | ORAL | 0 refills | Status: AC | PRN

## 2020-03-08 NOTE — Discharge Instructions (Signed)
Covid test pending, monitor my chart for results Tylenol and ibuprofen as needed for any fevers body aches headaches Tessalon for cough Mucinex DM to further help with cough and congestion Hydroxyzine refilled Rest and fluids Follow-up if any symptoms not improving or worsening

## 2020-03-08 NOTE — ED Provider Notes (Signed)
[facility]    CSN: [id number] Arrival date & time: 03/08/20  1526      History   Chief Complaint Chief Complaint  Patient presents with   Generalized Body Aches    HPI Pamela [name] is a 55 y.o. female presenting today for evaluation of URI symptoms.  Reports that her symptoms began with chills and headache on Saturday, slightly improved with Tylenol.  Since she has developed some URI symptoms of sore throat.  She has had very low energy and fatigue.  Reports that her daughter is sick, but denies any known Covid exposures.  Using Coricidin.  Does report feeling slightly lightheaded as well as reporting odd taste in mouth.  She has had decreased appetite.  HPI  Past Medical History:  Diagnosis Date   Asthma    DUB (dysfunctional uterine bleeding) 2007   Elevated cholesterol    H/O menorrhagia 05/01/2006   History of ovarian cyst 2007   Hypertension 03/31/2005   Increased BMI 07/11/2006   Migraine    N&V (nausea and vomiting)    Obesity 2007   Obstructive sleep apnea syndrome, moderate 10/27/2013   Weight loss 07/11/2006    Patient Active Problem List   Diagnosis Date Noted   Acute on chronic diastolic heart failure (HCC) 10/08/2019   Elevated troponin level not due myocardial infarction 09/18/2019   Mixed hyperlipidemia 09/18/2019   Hypertensive emergency 09/17/2019   Primary osteoarthritis of right hip 08/06/2018   Seasonal allergies 06/12/2018   Fibrocystic disease of breast 05/27/2018   Obstructive sleep apnea syndrome, moderate 10/27/2013   Shift work sleep disorder 10/27/2013   Psychic factors associated with diseases classified elsewhere 08/07/2013   High cholesterol 07/21/2013   HTN (hypertension) 07/21/2013   Snoring 07/21/2013   IUD contraception-inserted 10/01/11 10/01/2011   Class 3 severe obesity due to excess calories with serious comorbidity and body mass index (BMI) of 45.0 to 49.9 in adult [facility]) 09/03/2011     Past Surgical History:  Procedure Laterality Date   BREAST REDUCTION SURGERY  05/1991   CESAREAN SECTION     HYSTEROSCOPY  05/01/2006    OB History    Gravida  4   Para  2   Term  1   Preterm  1   AB  2   Living  2     SAB  1   IAB      Ectopic      Multiple      Live Births  2            Home Medications    Prior to Admission medications   Medication Sig Start Date End Date Taking? Authorizing Provider  benzonatate (TESSALON) 200 MG capsule Take 1 capsule (200 mg total) by mouth 3 (three) times daily as needed for up to 7 days for cough. 03/08/20 03/15/20 Yes Sharolyn Weber C, PA-C  dextromethorphan-guaiFENesin (MUCINEX DM) 30-600 MG 12hr tablet Take 1 tablet by mouth 2 (two) times daily. 03/08/20  Yes Naja Apperson C, PA-C  albuterol (VENTOLIN HFA) 108 (90 Base) MCG/ACT inhaler Inhale 2 puffs into the lungs every 6 (six) hours as needed for wheezing or shortness of breath. 06/25/18   Dorothyann Peng, MD  amLODipine (NORVASC) 10 MG tablet Take 1 tablet (10 mg total) by mouth daily. 06/17/19   Dorothyann Peng, MD  aspirin EC 81 MG tablet Take 1 tablet (81 mg total) by mouth daily. Swallow whole. 09/18/19   Elgergawy, Leana Roe, MD  Azilsartan-Chlorthalidone (EDARBYCLOR)  40-25 MG TABS Take 1 tablet by mouth daily. 12/31/19   [name], FNP  busPIRone (BUSPAR) 7.5 MG tablet Take 1 tablet (7.5 mg total) by mouth 2 (two) times daily. 12/31/19   [name], FNP  chlorhexidine (PERIDEX) 0.12 % solution Use as directed 10 mLs in the mouth or throat 2 (two) times daily. 01/06/20   Tasia Catchings, Amy V, PA-C  fluconazole (DIFLUCAN) 150 MG tablet Take 1 tablet (150 mg total) by mouth daily. Take second dose 72 hours later if symptoms still persists. 01/06/20   Tasia Catchings, Amy V, PA-C  hydrOXYzine (ATARAX/VISTARIL) 25 MG tablet Take 1 tablet (25 mg total) by mouth every 6 (six) hours as needed for anxiety. 03/08/20   Caria Transue C, PA-C  ibuprofen (ADVIL) 800 MG tablet Take 1 tablet (800  mg total) by mouth 3 (three) times daily. 01/06/20   Tasia Catchings, Amy V, PA-C  rosuvastatin (CRESTOR) 20 MG tablet TAKE 1 TABLET IN THE P.M. 09/22/19   [name], MD  Vitamin D, Ergocalciferol, (DRISDOL) 1.25 MG (50000 UNIT) CAPS capsule Take 1 capsule (50,000 Units total) by mouth every 7 (seven) days. 12/31/19   [name], FNP    Family History Family History  Problem Relation Age of Onset   Diabetes Maternal Grandmother    Hypertension Mother    Hypertension Father    Heart attack Father     Social History Social History   Tobacco Use   Smoking status: Never Smoker   Smokeless tobacco: Never Used  Scientific laboratory technician Use: Never used  Substance Use Topics   Alcohol use: No   Drug use: Not Currently    Frequency: 2.0 times per week     Allergies   Pollen extract, Latex, Codeine, Shellfish allergy, and Betadine [povidone iodine]   Review of Systems Review of Systems  Constitutional: Positive for appetite change. Negative for activity change, chills, fatigue and fever.  HENT: Positive for congestion and sore throat. Negative for ear pain, rhinorrhea, sinus pressure and trouble swallowing.   Eyes: Negative for discharge and redness.  Respiratory: Positive for cough. Negative for chest tightness and shortness of breath.   Cardiovascular: Negative for chest pain.  Gastrointestinal: Negative for abdominal pain, diarrhea, nausea and vomiting.  Musculoskeletal: Negative for myalgias.  Skin: Negative for rash.  Neurological: Negative for dizziness, light-headedness and headaches.     Physical Exam Triage Vital Signs ED Triage Vitals [03/08/20 1823]  Enc Vitals Group     BP 132/84     Pulse Rate 96     Resp 20     Temp 99.5 F (37.5 C)     Temp Source Oral     SpO2 99 %     Weight      Height      Head Circumference      Peak Flow      Pain Score 5     Pain Loc      Pain Edu?      Excl. in [place]?    No data found.  Updated Vital Signs BP 132/84 (BP  Location: Left Arm)    Pulse 96    Temp 99.5 F (37.5 C) (Oral)    Resp 20    SpO2 99%   Visual Acuity Right Eye Distance:   Left Eye Distance:   Bilateral Distance:    Right Eye Near:   Left Eye Near:    Bilateral Near:     Physical Exam Vitals and nursing note  reviewed.  Constitutional:      Appearance: She is well-developed and well-nourished.     Comments: No acute distress  HENT:     Head: Normocephalic and atraumatic.     Ears:     Comments: Bilateral ears without tenderness to palpation of external auricle, tragus and mastoid, EAC's without erythema or swelling, TM's with good bony landmarks and cone of light. Non erythematous.     Nose: Nose normal.     Mouth/Throat:     Comments: Oral mucosa pink and moist, no tonsillar enlargement or exudate. Posterior pharynx patent and nonerythematous, no uvula deviation or swelling. Normal phonation.  Eyes:     Conjunctiva/sclera: Conjunctivae normal.  Cardiovascular:     Rate and Rhythm: Normal rate.  Pulmonary:     Effort: Pulmonary effort is normal. No respiratory distress.     Comments: Breathing comfortably at rest, CTABL, no wheezing, rales or other adventitious sounds auscultated  Abdominal:     General: There is no distension.  Musculoskeletal:        General: Normal range of motion.     Cervical back: Neck supple.  Skin:    General: Skin is warm and dry.  Neurological:     Mental Status: She is alert and oriented to person, place, and time.  Psychiatric:        Mood and Affect: Mood and affect normal.      UC Treatments / Results  Labs (all labs ordered are listed, but only abnormal results are displayed) Labs Reviewed  NOVEL CORONAVIRUS, NAA    EKG   Radiology No results found.  Procedures Procedures (including critical care time)  Medications Ordered in UC Medications - No data to display  Initial Impression / Assessment and Plan / UC Course  I have reviewed the triage vital signs and the  nursing notes.  Pertinent labs & imaging results that were available during my care of the patient were reviewed by me and considered in my medical decision making (see chart for details).    Viral URI with cough-Covid test pending, recommend symptomatic and supportive care of cough congestion and sore throat.  Refilled hydroxyzine.  Monitor for gradual improvement.  Discussed strict return precautions. Patient verbalized understanding and is agreeable with plan.   Final Clinical Impressions(s) / UC Diagnoses   Final diagnoses:  Encounter for screening laboratory testing for COVID-19 virus  Viral URI with cough     Discharge Instructions     Covid test pending, monitor my chart for results Tylenol and ibuprofen as needed for any fevers body aches headaches Tessalon for cough Mucinex DM to further help with cough and congestion Hydroxyzine refilled Rest and fluids Follow-up if any symptoms not improving or worsening    ED Prescriptions    Medication Sig Dispense Auth. Provider   hydrOXYzine (ATARAX/VISTARIL) 25 MG tablet Take 1 tablet (25 mg total) by mouth every 6 (six) hours as needed for anxiety. 36 tablet Japleen Tornow C, PA-C   benzonatate (TESSALON) 200 MG capsule Take 1 capsule (200 mg total) by mouth 3 (three) times daily as needed for up to 7 days for cough. 28 capsule Kaelene Elliston C, PA-C   dextromethorphan-guaiFENesin (MUCINEX DM) 30-600 MG 12hr tablet Take 1 tablet by mouth 2 (two) times daily. 20 tablet Nechuma Boven, [place] C, PA-C     PDMP not reviewed this encounter.   Sharyn Brilliant, [place] C, PA-C 03/09/20 1022

## 2020-03-08 NOTE — ED Triage Notes (Signed)
Pt states has no appetite at all since Thursday.

## 2020-03-08 NOTE — ED Triage Notes (Signed)
Pt requesting refill on hydroxyzine.

## 2020-03-08 NOTE — ED Triage Notes (Signed)
Pt c/o body aches, sore throat, headaches, and fatigue since last Thursday.

## 2020-03-11 LAB — NOVEL CORONAVIRUS, NAA: SARS-CoV-2, NAA: NOT DETECTED

## 2020-03-24 ENCOUNTER — Telehealth: Payer: Self-pay

## 2020-03-24 ENCOUNTER — Other Ambulatory Visit: Payer: Self-pay

## 2020-03-24 ENCOUNTER — Encounter: Payer: 59 | Admitting: Internal Medicine

## 2020-03-24 NOTE — Telephone Encounter (Signed)
The pt gave consent for virtual visit for cough.

## 2020-03-25 NOTE — Progress Notes (Signed)
Pt did not "show up" for virtual visit.

## 2020-04-15 ENCOUNTER — Ambulatory Visit: Payer: 59 | Admitting: Cardiovascular Disease

## 2020-04-19 ENCOUNTER — Other Ambulatory Visit: Payer: Self-pay | Admitting: Obstetrics and Gynecology

## 2020-04-19 DIAGNOSIS — R928 Other abnormal and inconclusive findings on diagnostic imaging of breast: Secondary | ICD-10-CM

## 2020-04-25 ENCOUNTER — Other Ambulatory Visit: Payer: 59

## 2020-04-29 ENCOUNTER — Ambulatory Visit
Admission: RE | Admit: 2020-04-29 | Discharge: 2020-04-29 | Disposition: A | Discharge status: Home / Self Care | Payer: 59 | Source: Ambulatory Visit | Attending: Obstetrics and Gynecology | Admitting: Obstetrics and Gynecology

## 2020-04-29 ENCOUNTER — Telehealth: Payer: Self-pay

## 2020-04-29 ENCOUNTER — Other Ambulatory Visit: Payer: Self-pay

## 2020-04-29 DIAGNOSIS — R928 Other abnormal and inconclusive findings on diagnostic imaging of breast: Secondary | ICD-10-CM

## 2020-04-29 NOTE — Telephone Encounter (Signed)
The pt was notified that Dr Baird Cancer received FLMA papers and that the pt needed leave from 04/28/2020 - 05/03/2020 and wanted to know who took the pt out of work.  The pt said she had a biopsy performed today to remove a mass out her breast at [facility] and that she was referred there by her gyn Dr Mancel Bale.  The pt said that she had been speaking to a Vivien Rota at Dr Mancel Bale office about getting the paperwork filled out but that she hasn't been able to get a response on if they are going to fill out the form.  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  The pt said she may be able to take a doctor's note and that she didn't think she could get a note from [facility].

## 2020-05-03 ENCOUNTER — Telehealth: Payer: Self-pay | Admitting: Hematology and Oncology

## 2020-05-03 ENCOUNTER — Telehealth: Payer: Self-pay

## 2020-05-03 ENCOUNTER — Encounter: Payer: Self-pay | Admitting: *Deleted

## 2020-05-03 DIAGNOSIS — C50511 Malignant neoplasm of lower-outer quadrant of right female breast: Secondary | ICD-10-CM | POA: Insufficient documentation

## 2020-05-03 DIAGNOSIS — Z170 Estrogen receptor positive status [ER+]: Secondary | ICD-10-CM | POA: Insufficient documentation

## 2020-05-03 NOTE — Telephone Encounter (Signed)
The pt came in to pay for her flma form.  The pt was notified that the form has been completed, faxed and her original was given to the. A copy was kept for her chart.

## 2020-05-03 NOTE — Telephone Encounter (Signed)
Spoke to patient to confirm afternoon [facility] appointment for 3/9, packet will be emailed to patient

## 2020-05-04 ENCOUNTER — Inpatient Hospital Stay: Admission: RE | Admit: 2020-05-04 | Payer: Self-pay | Source: Ambulatory Visit

## 2020-05-10 ENCOUNTER — Encounter: Payer: Self-pay | Admitting: Internal Medicine

## 2020-05-10 NOTE — Progress Notes (Signed)
[facility] NOTE  Patient Care Team: [name], MD as PCP - General (Internal Medicine) Mcarthur Rossetti, MD as Consulting Physician (Orthopedic Surgery) Mauro Kaufmann, RN as Oncology Nurse Navigator Rockwell Germany, RN as Oncology Nurse Navigator [name], MD as Consulting Physician (General Surgery) Nicholas Lose, MD as Consulting Physician (Hematology and Oncology) Eppie Gibson, MD as Attending Physician (Radiation Oncology)  CHIEF COMPLAINTS/PURPOSE OF CONSULTATION:  Newly diagnosed breast cancer  HISTORY OF PRESENTING ILLNESS:  Pamela [name] 55 y.o. female is here because of recent diagnosis of invasive ductal carcinoma of the right breast. Screening mammogram on 02/02/20 showed a 1.0cm mass at the 6 o'clock position in the right breast. Diagnostic mammogram and [place] on 04/06/20 showed the 1.2cm mass at the 6 o'clock position in the right breast. Biopsy on 04/29/20 showed invasive ductal carcinoma, grade 3, HER-2 positive (3+), ER 15% weak, PR-, Ki67 80%. She presents to the clinic today for initial evaluation and discussion of treatment options.   I reviewed her records extensively and collaborated the history with the patient.  SUMMARY OF ONCOLOGIC HISTORY: Oncology History  Malignant neoplasm of lower-outer quadrant of right breast of female, estrogen receptor positive ([place])  05/03/2020 Initial Diagnosis   Screening mammogram showed a 1.0cm mass at the 6 o'clock position in the right breast. Diagnostic mammogram and US showed the 1.2cm mass at the 6 o'clock position in the right breast. Biopsy showed IDC, grade 3, HER-2 positive (3+), ER 15% weak, PR-, Ki67 80%.   05/11/2020 Cancer Staging   Staging form: Breast, AJCC 8th Edition - Clinical stage from 05/11/2020: Stage IA (cT1b, cN0, cM0, G3, ER+, PR-, HER2+) - Signed by Nicholas Lose, MD on 05/11/2020 Stage prefix: Initial diagnosis     MEDICAL HISTORY:  Past Medical History:   Diagnosis Date  . Asthma   . DUB (dysfunctional uterine bleeding) 2007  . Elevated cholesterol   . H/O menorrhagia 05/01/2006  . History of ovarian cyst 2007  . Hypertension 03/31/2005  . Increased BMI 07/11/2006  . Migraine   . N&V (nausea and vomiting)   . Obesity 2007  . Obstructive sleep apnea syndrome, moderate 10/27/2013  . Weight loss 07/11/2006    SURGICAL HISTORY: Past Surgical History:  Procedure Laterality Date  . BREAST REDUCTION SURGERY  05/1991  . CESAREAN SECTION    . HYSTEROSCOPY  05/01/2006    SOCIAL HISTORY: Social History   Socioeconomic History  . Marital status: Single    Spouse name: Not on file  . Number of children: Not on file  . Years of education: Not on file  . Highest education level: Not on file  Occupational History  . Not on file  Tobacco Use  . Smoking status: Never Smoker  . Smokeless tobacco: Never Used  Vaping Use  . Vaping Use: Never used  Substance and Sexual Activity  . Alcohol use: No  . Drug use: Not Currently    Frequency: 2.0 times per week  . Sexual activity: Never    Birth control/protection: I.U.D.    Comment: Mirena  Other Topics Concern  . Not on file  Social History Narrative  . Not on file   Social Determinants of Health   Financial Resource Strain: Not on file  Food Insecurity: Not on file  Transportation Needs: Not on file  Physical Activity: Not on file  Stress: Not on file  Social Connections: Not on file  Intimate Partner Violence: Not on file  FAMILY HISTORY: Family History  Problem Relation Age of Onset  . Diabetes Maternal Grandmother   . Hypertension Mother   . Hypertension Father   . Heart attack Father   . Bone cancer Maternal Grandfather   . Breast cancer Maternal Aunt     ALLERGIES:  is allergic to pollen extract, latex, codeine, shellfish allergy, and betadine [povidone iodine].  MEDICATIONS:  Current Outpatient Medications  Medication Sig Dispense Refill  . amLODipine (NORVASC) 10  MG tablet Take 1 tablet (10 mg total) by mouth daily. 90 tablet 2  . Azilsartan-Chlorthalidone (EDARBYCLOR) 40-25 MG TABS Take 1 tablet by mouth daily. 30 tablet 0  . hydrOXYzine (ATARAX/VISTARIL) 25 MG tablet Take 1 tablet (25 mg total) by mouth every 6 (six) hours as needed for anxiety. 36 tablet 0  . rosuvastatin (CRESTOR) 20 MG tablet TAKE 1 TABLET IN THE P.M. 90 tablet 2  . Vitamin D, Ergocalciferol, (DRISDOL) 1.25 MG (50000 UNIT) CAPS capsule Take 1 capsule (50,000 Units total) by mouth every 7 (seven) days. 24 capsule 1  . albuterol (VENTOLIN HFA) 108 (90 Base) MCG/ACT inhaler Inhale 2 puffs into the lungs every 6 (six) hours as needed for wheezing or shortness of breath. (Patient not taking: Reported on 05/11/2020) 1 Inhaler 2  . aspirin EC 81 MG tablet Take 1 tablet (81 mg total) by mouth daily. Swallow whole. 30 tablet 11  . busPIRone (BUSPAR) 7.5 MG tablet Take 1 tablet (7.5 mg total) by mouth 2 (two) times daily. (Patient not taking: Reported on 05/11/2020) 60 tablet 2  . chlorhexidine (PERIDEX) 0.12 % solution Use as directed 10 mLs in the mouth or throat 2 (two) times daily. (Patient not taking: Reported on 05/11/2020) 240 mL 0  . dextromethorphan-guaiFENesin (MUCINEX DM) 30-600 MG 12hr tablet Take 1 tablet by mouth 2 (two) times daily. (Patient not taking: Reported on 05/11/2020) 20 tablet 0  . fluconazole (DIFLUCAN) 150 MG tablet Take 1 tablet (150 mg total) by mouth daily. Take second dose 72 hours later if symptoms still persists. (Patient not taking: Reported on 05/11/2020) 2 tablet 0  . ibuprofen (ADVIL) 800 MG tablet Take 1 tablet (800 mg total) by mouth 3 (three) times daily. (Patient not taking: Reported on 05/11/2020) 21 tablet 0   No current facility-administered medications for this visit.    REVIEW OF SYSTEMS:   Constitutional: Denies fevers, chills or abnormal night sweats  All other systems were reviewed with the patient and are negative.  PHYSICAL EXAMINATION: ECOG PERFORMANCE  STATUS: 1 - Symptomatic but completely ambulatory  There were no vitals filed for this visit. There were no vitals filed for this visit.      LABORATORY DATA:  I have reviewed the data as listed Lab Results  Component Value Date   WBC 7.1 05/11/2020   HGB 11.7 (L) 05/11/2020   HCT 37.2 05/11/2020   MCV 83.8 05/11/2020   PLT 238 05/11/2020   Lab Results  Component Value Date   NA 143 05/11/2020   K 3.9 05/11/2020   CL 102 05/11/2020   CO2 30 05/11/2020    RADIOGRAPHIC STUDIES: I have personally reviewed the radiological reports and agreed with the findings in the report.  ASSESSMENT AND PLAN:  Malignant neoplasm of lower-outer quadrant of right breast of female, estrogen receptor positive ([place]) 05/03/2020:Screening mammogram showed a 1.0cm mass at the 6 o'clock position in the right breast. Diagnostic mammogram and US showed the 1.2cm mass at the 6 o'clock position in the right breast. Biopsy  showed IDC, grade 3, HER-2 positive (3+), ER 15% weak, PR-, Ki67 80%.  Pathology and radiology counseling: Discussed with the patient, the details of pathology including the type of breast cancer,the clinical staging, the significance of ER, PR and HER-2/neu receptors and the implications for treatment. After reviewing the pathology in detail, we proceeded to discuss the different treatment options between surgery, radiation, chemotherapy, antiestrogen therapies.  Treatment plan: 1.  Breast conserving surgery with sentinel lymph node biopsy 2. adjuvant chemotherapy with Taxol Herceptin followed by Herceptin maintenance. 3.  Adjuvant radiation therapy 4.  Adjuvant antiestrogen therapy 5.  Genetic testing (mother and grandmother breast cancer)  Chemo counseling: Taxol Herceptin risks and benefits were explained in great detail including risk of cytopenias, allergic reactions to Taxol, neuropathy and cytopenias along with LFT changes.  Herceptin-related cardiac toxicities were also discussed  in detail.  Return to make after surgery to discuss the final pathology report.   All questions were answered. The patient knows to call the clinic with any problems, questions or concerns.   Rulon Eisenmenger, MD, MPH 05/11/2020    I, Molly Dorshimer, am acting as scribe for Nicholas Lose, MD.  I have reviewed the above documentation for accuracy and completeness, and I agree with the above.

## 2020-05-11 ENCOUNTER — Encounter: Payer: Self-pay | Admitting: Radiation Oncology

## 2020-05-11 ENCOUNTER — Inpatient Hospital Stay: Payer: 59 | Attending: Hematology and Oncology

## 2020-05-11 ENCOUNTER — Other Ambulatory Visit: Payer: Self-pay

## 2020-05-11 ENCOUNTER — Inpatient Hospital Stay (HOSPITAL_BASED_OUTPATIENT_CLINIC_OR_DEPARTMENT_OTHER): Payer: 59

## 2020-05-11 ENCOUNTER — Ambulatory Visit (HOSPITAL_BASED_OUTPATIENT_CLINIC_OR_DEPARTMENT_OTHER): Payer: 59 | Admitting: Genetic Counselor

## 2020-05-11 ENCOUNTER — Ambulatory Visit
Admission: RE | Admit: 2020-05-11 | Discharge: 2020-05-11 | Disposition: A | Discharge status: Home / Self Care | Payer: 59 | Source: Ambulatory Visit | Attending: Radiation Oncology | Admitting: Radiation Oncology

## 2020-05-11 ENCOUNTER — Ambulatory Visit: Payer: 59 | Attending: Surgery | Admitting: Physical Therapy

## 2020-05-11 ENCOUNTER — Encounter: Payer: Self-pay | Admitting: *Deleted

## 2020-05-11 ENCOUNTER — Encounter: Payer: Self-pay | Admitting: Physical Therapy

## 2020-05-11 ENCOUNTER — Inpatient Hospital Stay (HOSPITAL_BASED_OUTPATIENT_CLINIC_OR_DEPARTMENT_OTHER): Payer: 59 | Admitting: Hematology and Oncology

## 2020-05-11 ENCOUNTER — Ambulatory Visit: Payer: Self-pay | Admitting: Surgery

## 2020-05-11 DIAGNOSIS — Z885 Allergy status to narcotic agent status: Secondary | ICD-10-CM | POA: Insufficient documentation

## 2020-05-11 DIAGNOSIS — Z833 Family history of diabetes mellitus: Secondary | ICD-10-CM | POA: Insufficient documentation

## 2020-05-11 DIAGNOSIS — Z803 Family history of malignant neoplasm of breast: Secondary | ICD-10-CM

## 2020-05-11 DIAGNOSIS — Z170 Estrogen receptor positive status [ER+]: Secondary | ICD-10-CM

## 2020-05-11 DIAGNOSIS — Z8249 Family history of ischemic heart disease and other diseases of the circulatory system: Secondary | ICD-10-CM | POA: Insufficient documentation

## 2020-05-11 DIAGNOSIS — I10 Essential (primary) hypertension: Secondary | ICD-10-CM

## 2020-05-11 DIAGNOSIS — R293 Abnormal posture: Secondary | ICD-10-CM

## 2020-05-11 DIAGNOSIS — Z808 Family history of malignant neoplasm of other organs or systems: Secondary | ICD-10-CM | POA: Insufficient documentation

## 2020-05-11 DIAGNOSIS — Z801 Family history of malignant neoplasm of trachea, bronchus and lung: Secondary | ICD-10-CM

## 2020-05-11 DIAGNOSIS — C50511 Malignant neoplasm of lower-outer quadrant of right female breast: Secondary | ICD-10-CM

## 2020-05-11 DIAGNOSIS — Z79899 Other long term (current) drug therapy: Secondary | ICD-10-CM | POA: Insufficient documentation

## 2020-05-11 DIAGNOSIS — C50911 Malignant neoplasm of unspecified site of right female breast: Secondary | ICD-10-CM

## 2020-05-11 DIAGNOSIS — Z23 Encounter for immunization: Secondary | ICD-10-CM

## 2020-05-11 DIAGNOSIS — Z883 Allergy status to other anti-infective agents status: Secondary | ICD-10-CM

## 2020-05-11 LAB — CBC WITH DIFFERENTIAL (CANCER CENTER ONLY)
Abs Immature Granulocytes: 0.1 10*3/uL — ABNORMAL HIGH (ref 0.00–0.07)
Basophils Absolute: 0 10*3/uL (ref 0.0–0.1)
Basophils Relative: 1 %
Eosinophils Absolute: 0.2 10*3/uL (ref 0.0–0.5)
Eosinophils Relative: 2 %
HCT: 37.2 % (ref 36.0–46.0)
Hemoglobin: 11.7 g/dL — ABNORMAL LOW (ref 12.0–15.0)
Immature Granulocytes: 1 %
Lymphocytes Relative: 25 %
Lymphs Abs: 1.8 10*3/uL (ref 0.7–4.0)
MCH: 26.4 pg (ref 26.0–34.0)
MCHC: 31.5 g/dL (ref 30.0–36.0)
MCV: 83.8 fL (ref 80.0–100.0)
Monocytes Absolute: 0.7 10*3/uL (ref 0.1–1.0)
Monocytes Relative: 10 %
Neutro Abs: 4.3 10*3/uL (ref 1.7–7.7)
Neutrophils Relative %: 61 %
Platelet Count: 238 10*3/uL (ref 150–400)
RBC: 4.44 MIL/uL (ref 3.87–5.11)
RDW: 14.9 % (ref 11.5–15.5)
WBC Count: 7.1 10*3/uL (ref 4.0–10.5)
nRBC: 0 % (ref 0.0–0.2)

## 2020-05-11 LAB — CMP (CANCER CENTER ONLY)
ALT: 20 U/L (ref 0–44)
AST: 19 U/L (ref 15–41)
Albumin: 4.3 g/dL (ref 3.5–5.0)
Alkaline Phosphatase: 56 U/L (ref 38–126)
Anion gap: 11 (ref 5–15)
BUN: 9 mg/dL (ref 6–20)
CO2: 30 mmol/L (ref 22–32)
Calcium: 9.9 mg/dL (ref 8.9–10.3)
Chloride: 102 mmol/L (ref 98–111)
Creatinine: 0.97 mg/dL (ref 0.44–1.00)
GFR, Estimated: >60 mL/min (ref >60)
Glucose, Bld: 112 mg/dL — ABNORMAL HIGH (ref 70–99)
Potassium: 3.9 mmol/L (ref 3.5–5.1)
Sodium: 143 mmol/L (ref 135–145)
Total Bilirubin: 0.8 mg/dL (ref 0.3–1.2)
Total Protein: 7.8 g/dL (ref 6.5–8.1)

## 2020-05-11 LAB — GENETIC SCREENING ORDER

## 2020-05-11 NOTE — Progress Notes (Signed)
[facility] Psychosocial Distress Screening Counseling Intern  Counseling intern was referred by distress screening protocol.  The patient scored a 5 on the Psychosocial Distress Thermometer which indicates moderate distress. Counseling intern met with patient in exam room" to assess for distress and other psychosocial needs. The patient attended clinic with her daughter, who will also be her caregiver. The patient reported good support and said her distress went down to a 4. Her anxiety has gone down but will go up again. Patient reported having coping mechanisms.   ONCBCN DISTRESS SCREENING 05/11/2020  Screening Type Initial Screening  Distress experienced in past week (1-10) 5  Emotional problem type Nervousness/Anxiety  Information Concerns Type Lack of info about diagnosis;Lack of info about treatment  Referral to support programs Yes    Follow up needed: No.  Gaylyn Rong Counseling Intern

## 2020-05-11 NOTE — H&P (View-Only) (Signed)
Pamela [name] Appointment: 05/11/2020 1:00 PM Location: [facility] Patient #: [id number] DOB: January 05, 1966 Undefined / Language: Pamela [name] / Race: Black or African American Female  History of Present Illness Pamela Moores A. Tannen Vandezande MD; 05/11/2020 2:56 PM) Patient words: Pt presents to the [facility] for evaluation SD abnormality right breast 6 oclock ICD grade 3 ER POS PR NEG HER 2 NEU POS IDC. No history of mass discharge or pain in either breast.  The patient is a 55 year old female.   Past Surgical History Conni Slipper, RN; 05/11/2020 8:27 AM) Cesarean Section - 1 Mammoplasty; Reduction Bilateral.  Diagnostic Studies History Conni Slipper, RN; 05/11/2020 8:27 AM) Colonoscopy 1-5 years ago Mammogram 1-3 years ago  Medication History Conni Slipper, RN; 05/11/2020 8:27 AM) Medications Reconciled  Social History Conni Slipper, RN; 05/11/2020 8:27 AM) Alcohol use Occasional alcohol use. Caffeine use Tea. No drug use Tobacco use Never smoker.  Family History Conni Slipper, RN; 05/11/2020 8:27 AM) Arthritis Mother. Breast Cancer Family Members In General. Cancer Family Members In General. Cerebrovascular Accident Father. Diabetes Mellitus Family Members In General. Kidney Disease Mother. Thyroid problems Mother.  Pregnancy / Birth History Conni Slipper, RN; 05/11/2020 8:27 AM) Age at menarche 24 years. Age of menopause 51-55 Contraceptive History Intrauterine device. Gravida 4 Irregular periods Maternal age <15 Para 2  Other Problems Conni Slipper, RN; 05/11/2020 8:27 AM) Anxiety Disorder Asthma Back Pain High blood pressure Hypercholesterolemia Migraine Headache     Review of Systems Conni Slipper RN; 05/11/2020 8:27 AM) General Not Present- Appetite Loss, Chills, Fatigue, Fever, Night Sweats, Weight Gain and Weight Loss. Skin Not Present- Change in Wart/Mole, Dryness, Hives, Jaundice, New Lesions, Non-Healing Wounds, Rash and Ulcer. HEENT Present- Wears  glasses/contact lenses. Not Present- Earache, Hearing Loss, Hoarseness, Nose Bleed, Oral Ulcers, Ringing in the Ears, Seasonal Allergies, Sinus Pain, Sore Throat, Visual Disturbances and Yellow Eyes. Respiratory Not Present- Bloody sputum, Chronic Cough, Difficulty Breathing, Snoring and Wheezing. Breast Present- Breast Pain. Not Present- Breast Mass, Nipple Discharge and Skin Changes. Cardiovascular Present- Swelling of Extremities. Not Present- Chest Pain, Difficulty Breathing Lying Down, Leg Cramps, Palpitations, Rapid Heart Rate and Shortness of Breath. Gastrointestinal Not Present- Abdominal Pain, Bloating, Bloody Stool, Change in Bowel Habits, Chronic diarrhea, Constipation, Difficulty Swallowing, Excessive gas, Gets full quickly at meals, Hemorrhoids, Indigestion, Nausea, Rectal Pain and Vomiting. Female Genitourinary Not Present- Frequency, Nocturia, Painful Urination, Pelvic Pain and Urgency. Musculoskeletal Present- Back Pain. Not Present- Joint Pain, Joint Stiffness, Muscle Pain, Muscle Weakness and Swelling of Extremities. Neurological Not Present- Decreased Memory, Fainting, Headaches, Numbness, Seizures, Tingling, Tremor, Trouble walking and Weakness. Psychiatric Present- Anxiety. Not Present- Bipolar, Change in Sleep Pattern, Depression, Fearful and Frequent crying. Endocrine Not Present- Cold Intolerance, Excessive Hunger, Hair Changes, Heat Intolerance, Hot flashes and New Diabetes. Hematology Not Present- Blood Thinners, Easy Bruising, Excessive bleeding, Gland problems, HIV and Persistent Infections.   Physical Exam (Pamela Lesinski A. Idania Desouza MD; 05/11/2020 2:56 PM)  General Mental Status-Alert. General Appearance-Consistent with stated age. Hydration-Well hydrated. Voice-Normal.  Head and Neck Head-normocephalic, atraumatic with no lesions or palpable masses. Trachea-midline. Thyroid Gland Characteristics - normal size and consistency.  Breast Breast -  Left-Symmetric, Non Tender, No Biopsy scars, no Dimpling - Left, No Inflammation, No Lumpectomy scars, No Mastectomy scars, No Peau d' Orange. Breast - Right-Symmetric, Non Tender, No Biopsy scars, no Dimpling - Right, No Inflammation, No Lumpectomy scars, No Mastectomy scars, No Peau d' Orange. Breast Lump-No Palpable Breast Mass.  Abdomen Inspection Inspection of the abdomen reveals - No  Hernias. Skin - Scar - no surgical scars. Palpation/Percussion Palpation and Percussion of the abdomen reveal - Soft, Non Tender, No Rebound tenderness, No Rigidity (guarding) and No hepatosplenomegaly. Auscultation Auscultation of the abdomen reveals - Bowel sounds normal.  Neurologic Neurologic evaluation reveals -alert and oriented x 3 with no impairment of recent or remote memory. Mental Status-Normal.  Lymphatic Head & Neck  General Head & Neck Lymphatics: Bilateral - Description - Normal. Axillary  General Axillary Region: Bilateral - Description - Normal. Tenderness - Non Tender.    Assessment & Plan (Pamela Witter A. Vicenta Olds MD; 05/11/2020 2:58 PM)  BREAST CANCER, RIGHT (C50.911) Impression: pt opted for right breast lumpectomy , right sln mapping and port placement for chemotherapy given her 2 neu status   Risk of lumpectomy include bleeding, infection, seroma, more surgery, use of seed/wire, wound care, cosmetic deformity and the need for other treatments, death , blood clots, death. Pt agrees to proceed. doing well drains out begin ABC class. Risk of sentinel lymph node mapping include bleeding, infection, lymphedema, shoulder pain. stiffness, dye allergy. cosmetic deformity , blood clots, death, need for more surgery. Pt agrees to proceed. Pt requires port placement for chemotherapy. Risk include bleeding, infection, pneumothorax, hemothorax, mediastinal injury, nerve injury , blood vessel injury, stroke, blood clots, death, migration. embolization and need for additional procedures. Pt  agrees to proceed.   total time 45 min  Current Plans You are being scheduled for surgery- Our schedulers will call you.  You should hear from our office's scheduling department within 5 working days about the location, date, and time of surgery. We try to make accommodations for patient's preferences in scheduling surgery, but sometimes the OR schedule or the surgeon's schedule prevents [place] from making those accommodations.  If you have not heard from our office [phone]) in 5 working days, call the office and ask for your surgeon's nurse.  If you have other questions about your diagnosis, plan, or surgery, call the office and ask for your surgeon's nurse.  Pt Education - flb breast cancer surgery: discussed with patient and provided information. We discussed the staging and pathophysiology of breast cancer. We discussed all of the different options for treatment for breast cancer including surgery, chemotherapy, radiation therapy, Herceptin, and antiestrogen therapy. We discussed a sentinel lymph node biopsy as she does not appear to having lymph node involvement right now. We discussed the performance of that with injection of radioactive tracer and blue dye. We discussed that she would have an incision underneath her axillary hairline. We discussed that there is a bout a 10-20% chance of having a positive node with a sentinel lymph node biopsy and we will await the permanent pathology to make any other first further decisions in terms of her treatment. One of these options might be to return to the operating room to perform an axillary lymph node dissection. We discussed about a 1-2% risk lifetime of chronic shoulder pain as well as lymphedema associated with a sentinel lymph node biopsy. We discussed the options for treatment of the breast cancer which included lumpectomy versus a mastectomy. We discussed the performance of the lumpectomy with a wire placement. We discussed a 10-20%  chance of a positive margin requiring reexcision in the operating room. We also discussed that she may need radiation therapy or antiestrogen therapy or both if she undergoes lumpectomy. We discussed the mastectomy and the postoperative care for that as well. We discussed that there is no difference in her survival whether she undergoes  lumpectomy with radiation therapy or antiestrogen therapy versus a mastectomy. There is a slight difference in the local recurrence rate being 3-5% with lumpectomy and about 1% with a mastectomy. We discussed the risks of operation including bleeding, infection, possible reoperation. She understands her further therapy will be based on what her stages at the time of her operation.  Pt Education - ABC (After Breast Cancer) Class Info: discussed with patient and provided information. Use of a central venous catheter for intravenous therapy was discussed. Technique of catheter placement using ultrasound and fluoroscopy guidance was discussed. Risks such as bleeding, infection, pneumothorax, catheter occlusion, reoperation, and other risks were discussed. I noted a good likelihood this will help address the problem. Questions were answered. The patient expressed understanding & wishes to proceed.

## 2020-05-11 NOTE — Therapy (Signed)
[place], [place], [address] Phone: [phone]   Fax:  [phone]  Physical Therapy Evaluation  Patient Details  Name: Pamela [name] MRN: [id number] Date of Birth: 16-Aug-1965 Referring Provider (PT): Dr. [name]   Encounter Date: 05/11/2020   PT End of Session - 05/11/20 1813    Visit Number 1    Number of Visits 2    Date for PT Re-Evaluation 07/06/20    PT Start Time 1532    PT Stop Time 1554    PT Time Calculation (min) 22 min    Activity Tolerance Patient tolerated treatment well    Behavior During Therapy [facility] for tasks assessed/performed           Past Medical History:  Diagnosis Date  . Asthma   . DUB (dysfunctional uterine bleeding) 2007  . Elevated cholesterol   . H/O menorrhagia 05/01/2006  . History of ovarian cyst 2007  . Hypertension 03/31/2005  . Increased BMI 07/11/2006  . Migraine   . N&V (nausea and vomiting)   . Obesity 2007  . Obstructive sleep apnea syndrome, moderate 10/27/2013  . Weight loss 07/11/2006    Past Surgical History:  Procedure Laterality Date  . BREAST REDUCTION SURGERY  05/1991  . CESAREAN SECTION    . HYSTEROSCOPY  05/01/2006    There were no vitals filed for this visit.    Subjective Assessment - 05/11/20 1759    Subjective Patient reports she is here today to be seen by her medical team for her newly diagnosed right breast cancer.    Patient is accompained by: Family member    Pertinent History Patient was diagnosed on 02/01/2021 with right grade III invasive ductal carcinoma breast cancer. It measures 1.2 cm and is located in the lower outer quadrant. It is weakly ER positive, PR negative, and HER2 positive with a Ki67 of 80%. Her BMI is 49.6 putting her at high risk for lymphedema.    Patient Stated Goals Reduce lymphedema risk and [name] post op shoulder ROM HEP    Currently in Pain? No/denies              [facility] PT Assessment -  05/11/20 0001      Assessment   Medical Diagnosis Right breast cancer    Referring Provider (PT) Dr. Marcello Moores [place]    Onset Date/Surgical Date 02/01/21    Hand Dominance Right    Prior Therapy none      Precautions   Precautions Other (comment)    Precaution Comments active cancer      Restrictions   Weight Bearing Restrictions No      Balance Screen   Has the patient fallen in the past 6 months No    Has the patient had a decrease in activity level because of a fear of falling?  No    Is the patient reluctant to leave their home because of a fear of falling?  No      Home Environment   Living Environment Private residence    Living Arrangements Children   54 y.o. daughter   Available Help at Discharge Family      Prior Function   Level of Independence Independent    Vocation Full time employment    Programme researcher, broadcasting/film/video for [employer] She does not exercise      Cognition   Overall Cognitive Status Within Functional Limits for tasks assessed  Posture/Postural Control   Posture/Postural Control Postural limitations    Postural Limitations Rounded Shoulders;Forward head      ROM / Strength   AROM / PROM / Strength AROM;Strength      AROM   Overall AROM Comments Cervical extension limited 50%; bilateral rotation limited 25%; others WNL    AROM Assessment Site Shoulder    Right/Left Shoulder Right;Left    Right Shoulder Extension 58 Degrees    Right Shoulder Flexion 158 Degrees    Right Shoulder ABduction 153 Degrees    Right Shoulder Internal Rotation 59 Degrees    Right Shoulder External Rotation 90 Degrees    Left Shoulder Extension 58 Degrees    Left Shoulder Flexion 158 Degrees    Left Shoulder ABduction 153 Degrees    Left Shoulder Internal Rotation 57 Degrees    Left Shoulder External Rotation 71 Degrees      Strength   Overall Strength Within functional limits for tasks performed             LYMPHEDEMA/ONCOLOGY  QUESTIONNAIRE - 05/11/20 0001      Type   Cancer Type right breast cancer      Lymphedema Assessments   Lymphedema Assessments Upper extremities      Right Upper Extremity Lymphedema   10 cm Proximal to Olecranon Process 44.8 cm    Olecranon Process 32.2 cm    10 cm Proximal to Ulnar Styloid Process 28.8 cm    Just Proximal to Ulnar Styloid Process 19 cm    Across Hand at [employer] 20.7 cm    At [place] of 2nd Digit 6.9 cm      Left Upper Extremity Lymphedema   10 cm Proximal to Olecranon Process 45.1 cm    Olecranon Process 31.6 cm    10 cm Proximal to Ulnar Styloid Process 27.6 cm    Just Proximal to Ulnar Styloid Process 19.3 cm    Across Hand at [employer] 20.3 cm    At [place] [employer] of 2nd Digit 6.9 cm           L-DEX FLOWSHEETS - 05/11/20 1800      L-DEX LYMPHEDEMA SCREENING   Measurement Type Unilateral    L-DEX MEASUREMENT EXTREMITY Upper Extremity    POSITION  Standing    DOMINANT SIDE Right    At Risk Side Right    BASELINE SCORE (UNILATERAL) -4.7          The patient was assessed using the L-Dex machine today to produce a lymphedema index baseline score. The patient will be reassessed on a regular basis (typically every 3 months) to obtain new L-Dex scores. If the score is > 6.5 points away from his/her baseline score indicating onset of subclinical lymphedema, it will be recommended to wear a compression garment for 4 weeks, 12 hours per day and then be reassessed. If the score continues to be > 6.5 points from baseline at reassessment, we will initiate lymphedema treatment. Assessing in this manner has a 95% rate of preventing clinically significant lymphedema.       Katina Dung - 05/11/20 0001    Open a tight or new jar No difficulty    Do heavy household chores (wash walls, wash floors) No difficulty    Carry a shopping bag or briefcase No difficulty    Wash your back No difficulty    Use a knife to cut food No difficulty    Recreational activities in  which you take some force  or impact through your arm, shoulder, or hand (golf, hammering, tennis) No difficulty    During the past week, to what extent has your arm, shoulder or hand problem interfered with your normal social activities with family, friends, neighbors, or groups? Not at all    During the past week, to what extent has your arm, shoulder or hand problem limited your work or other regular daily activities Not at all    Arm, shoulder, or hand pain. None    Tingling (pins and needles) in your arm, shoulder, or hand None    Difficulty Sleeping No difficulty    DASH Score 0 %            Objective measurements completed on examination: See above findings.         Patient was instructed today in a home exercise program today for post op shoulder range of motion. These included active assist shoulder flexion in sitting, scapular retraction, wall walking with shoulder abduction, and hands behind head external rotation.  She was encouraged to do these twice a day, holding 3 seconds and repeating 5 times when permitted by her physician.          PT Education - 05/11/20 1812    Education Details Lymphedema risk reduction and post op shoulder ROM HEP    Person(s) Educated Patient    Methods Explanation;Demonstration;Handout    Comprehension Returned demonstration;Verbalized understanding               PT Long Term Goals - 05/11/20 1826      PT LONG TERM GOAL #1   Title Patient will demonstrate she has regained full shoulder ROM and function post operatively compared to baselines.    Time 8    Period Weeks    Status New    Target Date 07/06/20           Breast Clinic Goals - 05/11/20 1823      Patient will be able to verbalize understanding of pertinent lymphedema risk reduction practices relevant to her diagnosis specifically related to skin care.   Time 1    Period Days    Status Achieved      Patient will be able to return demonstrate and/or verbalize  understanding of the post-op home exercise program related to regaining shoulder range of motion.   Time 1    Period Days    Status Achieved      Patient will be able to verbalize understanding of the importance of attending the postoperative After Breast Cancer Class for further lymphedema risk reduction education and therapeutic exercise.   Time 1    Period Days    Status Achieved                 Plan - 05/11/20 1813    Clinical Impression Statement Patient was diagnosed on 02/01/2021 with right grade III invasive ductal carcinoma breast cancer. It measures 1.2 cm and is located in the lower outer quadrant. It is weakly ER positive, PR negative, and HER2 positive with a Ki67 of 80%. Her BMI is 49.6 putting her at high risk for lymphedema. Her multidisiplinary medical team met prior to her assessments to determine a recommended treatment plan. She is planning to have a right lumpectomy and sentinel node biopsy followed by chemotherapy, radiation, and anti-estrogen therapy. She will benefit from a post op PT reassessment and from L-Dex screens every 3 months for 2 years to detect subclinical lymphedema.    Stability/Clinical Decision  Making Stable/Uncomplicated    Clinical Decision Making Low    Rehab Potential Excellent    PT Frequency --   Eval and 1 f/u visit   PT Treatment/Interventions ADLs/Self Care Home Management;Therapeutic exercise;Patient/family education    PT Next Visit Plan Will reassess 3-4 weeks post op to determine needs    PT Home Exercise Plan Post op shoulder ROM HEP    Consulted and Agree with Plan of Care Patient;Family member/caregiver    Family Member Consulted daughter           Patient will benefit from skilled therapeutic intervention in order to improve the following deficits and impairments:  Postural dysfunction,Decreased knowledge of precautions,Impaired UE functional use,Pain  Visit Diagnosis: Malignant neoplasm of lower-outer quadrant of right  breast of female, estrogen receptor positive ([place]) - Plan: PT plan of care cert/re-cert  Abnormal posture - Plan: PT plan of care cert/re-cert   Patient will follow up at outpatient cancer rehab 3-4 weeks following surgery.  If the patient requires physical therapy at that time, a specific plan will be dictated and sent to the referring physician for approval. The patient was educated today on appropriate basic range of motion exercises to begin post operatively and the importance of attending the After Breast Cancer class following surgery.  Patient was educated today on lymphedema risk reduction practices as it pertains to recommendations that will benefit the patient immediately following surgery.  She verbalized good understanding.      Problem List Patient Active Problem List   Diagnosis Date Noted  . Malignant neoplasm of lower-outer quadrant of right breast of female, estrogen receptor positive ([place]) 05/03/2020  . Acute on chronic diastolic heart failure ([place]) 10/08/2019  . Elevated troponin level not due myocardial infarction 09/18/2019  . Mixed hyperlipidemia 09/18/2019  . Hypertensive emergency 09/17/2019  . Primary osteoarthritis of right hip 08/06/2018  . Seasonal allergies 06/12/2018  . Fibrocystic disease of breast 05/27/2018  . Obstructive sleep apnea syndrome, moderate 10/27/2013  . Shift work sleep disorder 10/27/2013  . Psychic factors associated with diseases classified elsewhere 08/07/2013  . High cholesterol 07/21/2013  . HTN (hypertension) 07/21/2013  . Snoring 07/21/2013  . IUD contraception-inserted 10/01/11 10/01/2011  . Class 3 severe obesity due to excess calories with serious comorbidity and body mass index (BMI) of 45.0 to 49.9 in adult [facility] 09/03/2011   Annia Friendly, PT 05/11/20 6:35 PM  [place], [place], [address] Phone: [phone]   Fax:  [phone]  Name:  FLORABEL FAULKS MRN: [id number] Date of Birth: 03/21/1965

## 2020-05-11 NOTE — Patient Instructions (Signed)

## 2020-05-11 NOTE — Progress Notes (Signed)
REFERRING PROVIDER: Nicholas Lose, MD [address] [place],  [place] [address]  PRIMARY PROVIDER:  [name], MD  PRIMARY REASON FOR VISIT:  Encounter Diagnoses  Name Primary?   Malignant neoplasm of lower-outer quadrant of right breast of female, estrogen receptor positive ([place]) Yes   Family history of breast cancer    Family history of lung cancer     HISTORY OF PRESENT ILLNESS:   Ms. Mcmanamon, a 55 y.o. female, was seen for a [place] cancer genetics consultation during the breast multidisciplinary clinic at the request of Dr. Lindi Adie due to a personal and family history of cancer.  Ms. Geeting presents to clinic today with her daughter to discuss the possibility of a hereditary predisposition to cancer, to discuss genetic testing, and to further clarify her future cancer risks, as well as potential cancer risks for family members.   In March 2022, at the age of 14, Ms. Meixner was diagnosed with invasive ductal carcinoma of the right breast (ER+/PR-/HER2+). The preliminary treatment plan includes breast conserving surgery, adjuvant chemotherapy, adjuvant radiation, and anti-estrogens.   CANCER HISTORY:  Oncology History  Malignant neoplasm of lower-outer quadrant of right breast of female, estrogen receptor positive ([place])  05/03/2020 Initial Diagnosis   Screening mammogram showed a 1.0cm mass at the 6 o'clock position in the right breast. Diagnostic mammogram and US showed the 1.2cm mass at the 6 o'clock position in the right breast. Biopsy showed IDC, grade 3, HER-2 positive (3+), ER 15% weak, PR-, Ki67 80%.   05/11/2020 Cancer Staging   Staging form: Breast, AJCC 8th Edition - Clinical stage from 05/11/2020: Stage IA (cT1b, cN0, cM0, G3, ER+, PR-, HER2+) - Signed by Nicholas Lose, MD on 05/11/2020 Stage prefix: Initial diagnosis    RISK FACTORS:  Menarche was at age 76.  First live birth at age 89.  OCP use for approximately 4 years.  Ovaries intact: yes.   Hysterectomy: no.  Approx date of last period Dec 2021 HRT use: 0 years. Colonoscopy: yes; most recent in 2020. Mammogram within the last year: yes. Up to date with pelvic exams: yes; most recent PAP 12/2019   Past Medical History:  Diagnosis Date   Asthma    DUB (dysfunctional uterine bleeding) 2007   Elevated cholesterol    H/O menorrhagia 05/01/2006   History of ovarian cyst 2007   Hypertension 03/31/2005   Increased BMI 07/11/2006   Migraine    N&V (nausea and vomiting)    Obesity 2007   Obstructive sleep apnea syndrome, moderate 10/27/2013   Weight loss 07/11/2006    Past Surgical History:  Procedure Laterality Date   BREAST REDUCTION SURGERY  05/1991   CESAREAN SECTION     HYSTEROSCOPY  05/01/2006    Social History   Socioeconomic History   Marital status: Single    Spouse name: Not on file   Number of children: Not on file   Years of education: Not on file   Highest education level: Not on file  Occupational History   Not on file  Tobacco Use   Smoking status: Never Smoker   Smokeless tobacco: Never Used  Vaping Use   Vaping Use: Never used  Substance and Sexual Activity   Alcohol use: No   Drug use: Not Currently    Frequency: 2.0 times per week   Sexual activity: Never    Birth control/protection: I.U.D.    Comment: Mirena  Other Topics Concern   Not on file  Social History Narrative  Not on file   Social Determinants of Health   Financial Resource Strain: Not on file  Food Insecurity: Not on file  Transportation Needs: Not on file  Physical Activity: Not on file  Stress: Not on file  Social Connections: Not on file     FAMILY HISTORY:  We obtained a detailed, 4-generation family history.  Significant diagnoses are listed below: Family History  Problem Relation Age of Onset   Bone cancer Maternal Grandfather        dx after 50   Breast cancer Maternal Aunt        dx 22s   Lung cancer Maternal Aunt        dx  after 31    Ms. Brewbaker has one daughter and one son, both without cancer.  Her paternal half brothers and sister do not have a cancer history.   Ms. Manzella mother is living at age 28 without cancer.  Ms. Rester mother's maternal half sister had breast cancer in her 50s.  No prior genetic testing was reported.  Ms. Nofsinger also has a maternal aunt with breast cancer and a maternal grandfather with a history of bone cancer after the age of 69. Ms. Savona father died at age 40 and did not have cancer.  No paternal family history of cancer was reported.   Ms. Rosano is unaware of previous family history of genetic testing for hereditary cancer risks. Patient's maternal ancestors are of African American descent, and paternal ancestors are of African American descent. There is no reported Ashkenazi Jewish ancestry. There is no known consanguinity.  GENETIC COUNSELING ASSESSMENT: Ms. Mclaine is a 55 y.o. female with a personal and family history of cancer which is somewhat suggestive of a hereditary cancer syndrome and predisposition to cancer given the presence of related cancers in the family and the ages of diagnosis. We, therefore, discussed and recommended the following at today's visit.   DISCUSSION: We discussed that 5 - 10% of cancer is hereditary, with most cases of hereditary breast cancer associated with mutations in BRCA1/2.  There are other genes that can be associated with hereditary breast cancer syndromes.  Type of cancer risk and level of risk are gene-specific. We discussed that testing is beneficial for several reasons including knowing how to follow individuals after completing their treatment, identifying whether potential treatment options would be beneficial, and understanding if other family members could be at risk for cancer and allowing them to undergo genetic testing.   We reviewed the characteristics, features and inheritance patterns of hereditary cancer syndromes. We also  discussed genetic testing, including the appropriate family members to test, the process of testing, insurance coverage and turn-around-time for results. We discussed the implications of a negative, positive and/or variant of uncertain significant result. In order to get genetic test results in a timely manner so that Ms. Hassan can use these genetic test results for surgical decisions, we recommended Ms. Buehrer pursue genetic testing for the [facility] STAT Panel.  The STAT Breast cancer panel offered by [facility] includes sequencing and rearrangement analysis for the following 9 genes:  ATM, BRCA1, BRCA2, CDH1, CHEK2, PALB2, PTEN, STK11 and TP53.   Once complete, we recommend Ms. Cid pursue reflex genetic testing to a more comprehensive gene panel.   Ms. Yinger  was offered a common hereditary cancer panel (47 genes) and an expanded pan-cancer panel (84 genes). Ms. Perusse was informed of the benefits and limitations of each panel, including that expanded pan-cancer panels contain genes  that do not have clear management guidelines at this point in time.  We also discussed that as the number of genes included on a panel increases, the chances of variants of uncertain significance increases.  After considering the benefits and limitations of each gene panel, Ms. Rozar  elected to have a common hereditary cancers panel through [facility].  The Common Hereditary Cancers Panel offered by [facility] includes sequencing and/or deletion duplication testing of the following 47 genes: APC, ATM, AXIN2, BARD1, BMPR1A, BRCA1, BRCA2, BRIP1, CDH1, CDK4, CDKN2A (p14ARF), CDKN2A (p16INK4a), CHEK2, CTNNA1, DICER1, EPCAM (Deletion/duplication testing only), GREM1 (promoter region deletion/duplication testing only), GREM1, HOXB13, KIT, MEN1, MLH1, MSH2, MSH3, MSH6, MUTYH, NBN, NF1, NHTL1, PALB2, PDGFRA, PMS2, POLD1, POLE, PTEN, RAD50, RAD51C, RAD51D, SDHA, SDHB, SDHC, SDHD, SMAD4, SMARCA4. STK11, TP53, TSC1, TSC2, and VHL.  The  following genes were evaluated for sequence changes only: SDHA and HOXB13 c.251G>A variant only.es only: SDHA and HOXB13 c.251G>A variant only.  Based on Ms. Zeleznik's personal and family history of cancer, she meets medical criteria for genetic testing. Despite that she meets criteria, she may still have an out of pocket cost. We discussed that if she has an out of pocket cost, the laboratory should contact her to discuss self-pay prices, patient pay assistance programs, if applicable, and other billing options.   PLAN: After considering the risks, benefits, and limitations, Ms. Mostafa provided informed consent to pursue genetic testing and the blood sample was sent to [facility] for analysis of the STAT + Common Hereditary Cancers Panel. Results should be available within approximately 1-2 weeks' time, at which point they will be disclosed by telephone to Ms. Stilley, as will any additional recommendations warranted by these results. Ms. Brinker will receive a summary of her genetic counseling visit and a copy of her results once available. This information will also be available in Epic.   Lastly, we encouraged Ms. Antonellis to remain in contact with cancer genetics annually so that we can continuously update the family history and inform her of any changes in cancer genetics and testing that may be of benefit for this family.   Ms. Porcelli questions were answered to her satisfaction today. Our contact information was provided should additional questions or concerns arise. Thank you for the referral and allowing [place] to share in the care of your patient.   Malon Branton M. Joette Catching, [place], [facility] Genetic Counselor [email or web address] (P) [phone]  The patient was seen for a total of 20 minutes in face-to-face genetic counseling.  Drs. Magrinat, Lindi Adie and/or Burr Medico were available to discuss this case as needed.  _______________________________________________________________________ For Office  Staff:  Number of people involved in session: 1 Was an Intern/ student involved with case: no

## 2020-05-11 NOTE — Progress Notes (Signed)
Radiation Oncology         [phone] ________________________________  Initial Outpatient Consultation  Name: Pamela [name] MRN: [id number]  Date: 05/11/2020  DOB: 23-Jul-1965  VQ:[id number], Bailey Mech, MD  [name], MD   REFERRING PHYSICIAN: [name], MD  DIAGNOSIS:    ICD-10-CM   1. Malignant neoplasm of lower-outer quadrant of right breast of female, estrogen receptor positive ([place])  C50.511    Z17.0     Cancer Staging Malignant neoplasm of lower-outer quadrant of right breast of female, estrogen receptor positive ([place]) Staging form: Breast, AJCC 8th Edition - Clinical stage from 05/11/2020: Stage IA (cT1b, cN0, cM0, G3, ER+, PR-, HER2+) - Signed by Nicholas Lose, MD on 05/11/2020 Stage prefix: Initial diagnosis  CHIEF COMPLAINT: Here to discuss management of right breast cancer  HISTORY OF PRESENT ILLNESS::Pamela [name] is a 55 y.o. female who presented with right breast abnormality on the following imaging: bilateral screening mammogram on the date of 02/02/2020. No symptoms were reported at that time. Ultrasound of the right breast on 04/06/2020 revealed a 0.9 x 0.8 x 1.2 cm irregular-shaped nonvascular hypoechoic mass at the 6 o'clock position, 9 cm from the nipple, that was suspicious for malignancy. Biopsy on the date of 04/29/2020 showed invasive ductal carcinoma. ER status: 15% weak; PR status: 0% negative; Her2 status: positive; Grade: 3.  She works as a Education officer, museum.  She travels and makes home visits around the community.  She is here with her daughter today.   She is otherwise in her usual state of health.  PREVIOUS RADIATION THERAPY: No  PAST MEDICAL HISTORY:  has a past medical history of Asthma, DUB (dysfunctional uterine bleeding) (2007), Elevated cholesterol, H/O menorrhagia (05/01/2006), History of ovarian cyst (2007), Hypertension (03/31/2005), Increased BMI (07/11/2006), Migraine, N&V (nausea and vomiting), Obesity (2007), Obstructive sleep apnea  syndrome, moderate (10/27/2013), and Weight loss (07/11/2006).    PAST SURGICAL HISTORY: Past Surgical History:  Procedure Laterality Date  . BREAST REDUCTION SURGERY  05/1991  . CESAREAN SECTION    . HYSTEROSCOPY  05/01/2006    FAMILY HISTORY: family history includes Bone cancer in her maternal grandfather; Breast cancer in her maternal aunt; Diabetes in her maternal grandmother; Heart attack in her father; Hypertension in her father and mother.  SOCIAL HISTORY:  reports that she has never smoked. She has never used smokeless tobacco. She reports previous drug use. Frequency: 2.00 times per week. She reports that she does not drink alcohol.  ALLERGIES: Pollen extract, Latex, Codeine, Shellfish allergy, and Betadine [povidone iodine]  MEDICATIONS:  Current Outpatient Medications  Medication Sig Dispense Refill  . albuterol (VENTOLIN HFA) 108 (90 Base) MCG/ACT inhaler Inhale 2 puffs into the lungs every 6 (six) hours as needed for wheezing or shortness of breath. (Patient not taking: Reported on 05/11/2020) 1 Inhaler 2  . amLODipine (NORVASC) 10 MG tablet Take 1 tablet (10 mg total) by mouth daily. 90 tablet 2  . aspirin EC 81 MG tablet Take 1 tablet (81 mg total) by mouth daily. Swallow whole. 30 tablet 11  . Azilsartan-Chlorthalidone (EDARBYCLOR) 40-25 MG TABS Take 1 tablet by mouth daily. 30 tablet 0  . busPIRone (BUSPAR) 7.5 MG tablet Take 1 tablet (7.5 mg total) by mouth 2 (two) times daily. (Patient not taking: Reported on 05/11/2020) 60 tablet 2  . chlorhexidine (PERIDEX) 0.12 % solution Use as directed 10 mLs in the mouth or throat 2 (two) times daily. (Patient not taking: Reported on 05/11/2020) 240 mL 0  . dextromethorphan-guaiFENesin (  MUCINEX DM) 30-600 MG 12hr tablet Take 1 tablet by mouth 2 (two) times daily. (Patient not taking: Reported on 05/11/2020) 20 tablet 0  . fluconazole (DIFLUCAN) 150 MG tablet Take 1 tablet (150 mg total) by mouth daily. Take second dose 72 hours later if symptoms  still persists. (Patient not taking: Reported on 05/11/2020) 2 tablet 0  . hydrOXYzine (ATARAX/VISTARIL) 25 MG tablet Take 1 tablet (25 mg total) by mouth every 6 (six) hours as needed for anxiety. 36 tablet 0  . ibuprofen (ADVIL) 800 MG tablet Take 1 tablet (800 mg total) by mouth 3 (three) times daily. (Patient not taking: Reported on 05/11/2020) 21 tablet 0  . rosuvastatin (CRESTOR) 20 MG tablet TAKE 1 TABLET IN THE P.M. 90 tablet 2  . Vitamin D, Ergocalciferol, (DRISDOL) 1.25 MG (50000 UNIT) CAPS capsule Take 1 capsule (50,000 Units total) by mouth every 7 (seven) days. 24 capsule 1   No current facility-administered medications for this encounter.    REVIEW OF SYSTEMS: As above  PHYSICAL EXAM:  vitals were not taken for this visit.   General: Alert and oriented, in no acute distress Heart: Regular in rate and rhythm with soft systolic murmur at the aortic region  Chest: Clear to auscultation bilaterally, with no rhonchi, wheezes, or rales. Psychiatric: Judgment and insight are intact. Affect is appropriate. Breasts: No palpable masses appreciated in the breasts or axillae bilaterally.    ECOG = 0  0 - Asymptomatic (Fully active, able to carry on all predisease activities without restriction)  1 - Symptomatic but completely ambulatory (Restricted in physically strenuous activity but ambulatory and able to carry out work of a light or sedentary nature. For example, light housework, office work)  2 - Symptomatic, <50% in bed during the day (Ambulatory and capable of all self care but unable to carry out any work activities. Up and about more than 50% of waking hours)  3 - Symptomatic, >50% in bed, but not bedbound (Capable of only limited self-care, confined to bed or chair 50% or more of waking hours)  4 - Bedbound (Completely disabled. Cannot carry on any self-care. Totally confined to bed or chair)  5 - Death   Eustace Pen MM, Creech RH, Tormey DC, et al. [phone]). "Toxicity and response  criteria of the [facility]". [place] Oncol. 5 (6): 649-55   LABORATORY DATA:  Lab Results  Component Value Date   WBC 7.1 05/11/2020   HGB 11.7 (L) 05/11/2020   HCT 37.2 05/11/2020   MCV 83.8 05/11/2020   PLT 238 05/11/2020   CMP     Component Value Date/Time   NA 143 05/11/2020 1218   NA 142 09/22/2019 1040   K 3.9 05/11/2020 1218   CL 102 05/11/2020 1218   CO2 30 05/11/2020 1218   GLUCOSE 112 (H) 05/11/2020 1218   BUN 9 05/11/2020 1218   BUN 11 09/22/2019 1040   CREATININE 0.97 05/11/2020 1218   CALCIUM 9.9 05/11/2020 1218   PROT 7.8 05/11/2020 1218   PROT 6.7 06/17/2019 1054   ALBUMIN 4.3 05/11/2020 1218   ALBUMIN 4.5 06/17/2019 1054   AST 19 05/11/2020 1218   ALT 20 05/11/2020 1218   ALKPHOS 56 05/11/2020 1218   BILITOT 0.8 05/11/2020 1218   GFRNONAA >60 05/11/2020 1218   GFRAA 77 09/22/2019 1040         RADIOGRAPHY: MM CLIP PLACEMENT RIGHT  Result Date: 04/29/2020 CLINICAL DATA:  Evaluate biopsy marker EXAM: DIAGNOSTIC RIGHT MAMMOGRAM POST  ULTRASOUND BIOPSY COMPARISON:  Previous exam(s). FINDINGS: Mammographic images were obtained following ultrasound guided biopsy of a right breast mass. The biopsy marking clip is within the biopsied right breast mass IMPRESSION: Appropriate positioning of the ribbon shaped biopsy marking clip at the site of biopsy in the biopsied right breast mass. Final Assessment: Post Procedure Mammograms for Marker Placement Electronically Signed   By: Dorise Bullion III M.D   On: 04/29/2020 13:50   [place] RT BREAST BX W LOC DEV 1ST LESION IMG BX SPEC US GUIDE  Addendum Date: 05/04/2020   ADDENDUM REPORT: 05/02/2020 15:09 ADDENDUM: Pathology revealed GRADE III INVASIVE DUCTAL CARCINOMA of the Right breast, 6 o'clock. This was found to be concordant by Dr. Dorise Bullion. Pathology results were discussed with the patient by telephone. The patient reported doing well after the biopsy with tenderness and oozing at the  site. Post biopsy instructions and care were reviewed and questions were answered. The patient was encouraged to call [facility] for any additional concerns. My direct phone number was provided. The patient was referred to [facility] on May 11, 2020. Pathology results reported by Terie Purser, RN on 05/02/2020. Electronically Signed   By: Dorise Bullion III M.D   On: 05/02/2020 15:09   Result Date: 05/04/2020 CLINICAL DATA:  Biopsy of a right breast mass EXAM: ULTRASOUND GUIDED RIGHT BREAST CORE NEEDLE BIOPSY COMPARISON:  Previous exam(s). PROCEDURE: I met with the patient and we discussed the procedure of ultrasound-guided biopsy, including benefits and alternatives. We discussed the high likelihood of a successful procedure. We discussed the risks of the procedure, including infection, bleeding, tissue injury, clip migration, and inadequate sampling. Informed written consent was given. The usual time-out protocol was performed immediately prior to the procedure. Lesion quadrant: 6 o'clock right breast Using sterile technique and 1% Lidocaine as local anesthetic, under direct ultrasound visualization, a 12 gauge spring-loaded device was used to perform biopsy of a 6 o'clock right breast mass using a lateral approach. At the conclusion of the procedure a ribbon shaped tissue marker clip was deployed into the biopsy cavity. Follow up 2 view mammogram was performed and dictated separately. IMPRESSION: Ultrasound guided biopsy of a 6 o'clock right breast mass. No apparent complications. Electronically Signed: By: Dorise Bullion III M.D On: 04/29/2020 13:26      IMPRESSION/PLAN: Right breast cancer  She has been discussed at our multidisciplinary tumor board.  The consensus is that she would be a good candidate for breast conservation. I talked to her about the option of a mastectomy and informed her that  her expected overall survival would be equivalent between mastectomy and breast conservation, based upon randomized controlled data. She is enthusiastic about breast conservation.  It was a pleasure meeting the patient today. We discussed the risks, benefits, and side effects of radiotherapy. I recommend radiotherapy to the right breast to reduce her risk of locoregional recurrence by 2/3.  We discussed that radiation would take approximately 4-6 weeks to complete and that I would give the patient a few weeks to heal following surgery before starting treatment planning. As chemotherapy will be given, this will precede radiotherapy. We spoke about acute effects including skin irritation and fatigue as well as much less common late effects including internal organ injury or irritation. We spoke about the latest technology that is used to minimize the risk of late effects for patients undergoing radiotherapy to the breast or  chest wall. No guarantees of treatment were given. The patient is enthusiastic about proceeding with treatment. I look forward to participating in the patient's care.  I will await her referral back to me for postoperative follow-up and eventual CT simulation/treatment planning.  We discussed measures to reduce the risk of infection during the COVID-19 pandemic.  She received her second shot in September.  She did get COVID in early January.  We talked about the pros and cons of waiting to get her booster shot versus getting it now.  She would like to receive it now.  We will provide it to her today in the cancer center.  Her daughter has not been vaccinated would like to receive the vaccination as well.  We will give her daughter her first shot today. Therapist, music)  On date of service, in total, I spent 45 minutes on this encounter. Patient was seen in person.   __________________________________________   Eppie Gibson, MD  This document serves as a record of services personally performed  by Eppie Gibson, MD. It was created on his behalf by Clerance Lav, a trained medical scribe. The creation of this record is based on the scribe's personal observations and the provider's statements to them. This document has been checked and approved by the attending provider.

## 2020-05-11 NOTE — Assessment & Plan Note (Signed)
05/03/2020:Screening mammogram showed a 1.0cm mass at the 6 o'clock position in the right breast. Diagnostic mammogram and US showed the 1.2cm mass at the 6 o'clock position in the right breast. Biopsy showed IDC, grade 3, HER-2 positive (3+), ER 15% weak, PR-, Ki67 80%.  Pathology and radiology counseling: Discussed with the patient, the details of pathology including the type of breast cancer,the clinical staging, the significance of ER, PR and HER-2/neu receptors and the implications for treatment. After reviewing the pathology in detail, we proceeded to discuss the different treatment options between surgery, radiation, chemotherapy, antiestrogen therapies.  Treatment plan: 1.  Breast conserving surgery with sentinel lymph node biopsy 2. adjuvant chemotherapy with Taxol Herceptin for Herceptin maintenance. 3.  Adjuvant radiation therapy 4.  Adjuvant antiestrogen therapy 5.  Genetic testing (mother and grandmother breast cancer)  Chemo counseling: Taxol Herceptin risks and benefits were explained in great detail including risk of cytopenias, allergic reactions to Taxol, neuropathy and cytopenias along with LFT changes.  Herceptin-related cardiac toxicities were also discussed in detail.  Return to make after surgery to discuss the final pathology report.

## 2020-05-11 NOTE — Progress Notes (Signed)
   Covid-19 Vaccination Clinic  Name:  Pamela [name]    MRN: [id number] DOB: 13-Aug-1965  05/11/2020  Pamela [name] was observed post Covid-19 immunization for 15 minutes without incident. She was provided with Vaccine Information Sheet and instruction to access the V-Safe system.   Pamela [name] was instructed to call 911 with any severe reactions post vaccine: Marland Kitchen Difficulty breathing  . Swelling of face and throat  . A fast heartbeat  . A bad rash all over body  . Dizziness and weakness   Immunizations Administered    Name Date Dose VIS Date Route   PFIZER Comrnaty(Gray TOP) Covid-19 Vaccine 05/11/2020  2:54 PM 0.3 mL 02/11/2020 Intramuscular   Manufacturer: [place]   Lot: [id number]   NDC: [id number]

## 2020-05-11 NOTE — H&P (Signed)
Pamela [name] Appointment: 05/11/2020 1:00 PM Location: [facility] Patient #: [id number] DOB: 26-Aug-1965 Undefined / Language: Cleophus Molt / Race: Black or African American Female  History of Present Illness Marcello Moores A. Meaghann Choo MD; 05/11/2020 2:56 PM) Patient words: Pt presents to the [facility] for evaluation SD abnormality right breast 6 oclock ICD grade 3 ER POS PR NEG HER 2 NEU POS IDC. No history of mass discharge or pain in either breast.  The patient is a 55 year old female.   Past Surgical History Conni Slipper, RN; 05/11/2020 8:27 AM) Cesarean Section - 1 Mammoplasty; Reduction Bilateral.  Diagnostic Studies History Conni Slipper, RN; 05/11/2020 8:27 AM) Colonoscopy 1-5 years ago Mammogram 1-3 years ago  Medication History Conni Slipper, RN; 05/11/2020 8:27 AM) Medications Reconciled  Social History Conni Slipper, RN; 05/11/2020 8:27 AM) Alcohol use Occasional alcohol use. Caffeine use Tea. No drug use Tobacco use Never smoker.  Family History Conni Slipper, RN; 05/11/2020 8:27 AM) Arthritis Mother. Breast Cancer Family Members In General. Cancer Family Members In General. Cerebrovascular Accident Father. Diabetes Mellitus Family Members In General. Kidney Disease Mother. Thyroid problems Mother.  Pregnancy / Birth History Conni Slipper, RN; 05/11/2020 8:27 AM) Age at menarche 40 years. Age of menopause 51-55 Contraceptive History Intrauterine device. Gravida 4 Irregular periods Maternal age <15 Para 2  Other Problems Conni Slipper, RN; 05/11/2020 8:27 AM) Anxiety Disorder Asthma Back Pain High blood pressure Hypercholesterolemia Migraine Headache     Review of Systems Conni Slipper RN; 05/11/2020 8:27 AM) General Not Present- Appetite Loss, Chills, Fatigue, Fever, Night Sweats, Weight Gain and Weight Loss. Skin Not Present- Change in Wart/Mole, Dryness, Hives, Jaundice, New Lesions, Non-Healing Wounds, Rash and Ulcer. HEENT Present- Wears  glasses/contact lenses. Not Present- Earache, Hearing Loss, Hoarseness, Nose Bleed, Oral Ulcers, Ringing in the Ears, Seasonal Allergies, Sinus Pain, Sore Throat, Visual Disturbances and Yellow Eyes. Respiratory Not Present- Bloody sputum, Chronic Cough, Difficulty Breathing, Snoring and Wheezing. Breast Present- Breast Pain. Not Present- Breast Mass, Nipple Discharge and Skin Changes. Cardiovascular Present- Swelling of Extremities. Not Present- Chest Pain, Difficulty Breathing Lying Down, Leg Cramps, Palpitations, Rapid Heart Rate and Shortness of Breath. Gastrointestinal Not Present- Abdominal Pain, Bloating, Bloody Stool, Change in Bowel Habits, Chronic diarrhea, Constipation, Difficulty Swallowing, Excessive gas, Gets full quickly at meals, Hemorrhoids, Indigestion, Nausea, Rectal Pain and Vomiting. Female Genitourinary Not Present- Frequency, Nocturia, Painful Urination, Pelvic Pain and Urgency. Musculoskeletal Present- Back Pain. Not Present- Joint Pain, Joint Stiffness, Muscle Pain, Muscle Weakness and Swelling of Extremities. Neurological Not Present- Decreased Memory, Fainting, Headaches, Numbness, Seizures, Tingling, Tremor, Trouble walking and Weakness. Psychiatric Present- Anxiety. Not Present- Bipolar, Change in Sleep Pattern, Depression, Fearful and Frequent crying. Endocrine Not Present- Cold Intolerance, Excessive Hunger, Hair Changes, Heat Intolerance, Hot flashes and New Diabetes. Hematology Not Present- Blood Thinners, Easy Bruising, Excessive bleeding, Gland problems, HIV and Persistent Infections.   Physical Exam (Lowen Mansouri A. Denya Buckingham MD; 05/11/2020 2:56 PM)  General Mental Status-Alert. General Appearance-Consistent with stated age. Hydration-Well hydrated. Voice-Normal.  Head and Neck Head-normocephalic, atraumatic with no lesions or palpable masses. Trachea-midline. Thyroid Gland Characteristics - normal size and consistency.  Breast Breast -  Left-Symmetric, Non Tender, No Biopsy scars, no Dimpling - Left, No Inflammation, No Lumpectomy scars, No Mastectomy scars, No Peau d' Orange. Breast - Right-Symmetric, Non Tender, No Biopsy scars, no Dimpling - Right, No Inflammation, No Lumpectomy scars, No Mastectomy scars, No Peau d' Orange. Breast Lump-No Palpable Breast Mass.  Abdomen Inspection Inspection of the abdomen reveals - No  Hernias. Skin - Scar - no surgical scars. Palpation/Percussion Palpation and Percussion of the abdomen reveal - Soft, Non Tender, No Rebound tenderness, No Rigidity (guarding) and No hepatosplenomegaly. Auscultation Auscultation of the abdomen reveals - Bowel sounds normal.  Neurologic Neurologic evaluation reveals -alert and oriented x 3 with no impairment of recent or remote memory. Mental Status-Normal.  Lymphatic Head & Neck  General Head & Neck Lymphatics: Bilateral - Description - Normal. Axillary  General Axillary Region: Bilateral - Description - Normal. Tenderness - Non Tender.    Assessment & Plan (Ivey Cina A. Dewan Emond MD; 05/11/2020 2:58 PM)  BREAST CANCER, RIGHT (C50.911) Impression: pt opted for right breast lumpectomy , right sln mapping and port placement for chemotherapy given her 2 neu status   Risk of lumpectomy include bleeding, infection, seroma, more surgery, use of seed/wire, wound care, cosmetic deformity and the need for other treatments, death , blood clots, death. Pt agrees to proceed. doing well drains out begin ABC class. Risk of sentinel lymph node mapping include bleeding, infection, lymphedema, shoulder pain. stiffness, dye allergy. cosmetic deformity , blood clots, death, need for more surgery. Pt agrees to proceed. Pt requires port placement for chemotherapy. Risk include bleeding, infection, pneumothorax, hemothorax, mediastinal injury, nerve injury , blood vessel injury, stroke, blood clots, death, migration. embolization and need for additional procedures. Pt  agrees to proceed.   total time 45 min  Current Plans You are being scheduled for surgery- Our schedulers will call you.  You should hear from our office's scheduling department within 5 working days about the location, date, and time of surgery. We try to make accommodations for patient's preferences in scheduling surgery, but sometimes the OR schedule or the surgeon's schedule prevents [place] from making those accommodations.  If you have not heard from our office [phone]) in 5 working days, call the office and ask for your surgeon's nurse.  If you have other questions about your diagnosis, plan, or surgery, call the office and ask for your surgeon's nurse.  Pt Education - flb breast cancer surgery: discussed with patient and provided information. We discussed the staging and pathophysiology of breast cancer. We discussed all of the different options for treatment for breast cancer including surgery, chemotherapy, radiation therapy, Herceptin, and antiestrogen therapy. We discussed a sentinel lymph node biopsy as she does not appear to having lymph node involvement right now. We discussed the performance of that with injection of radioactive tracer and blue dye. We discussed that she would have an incision underneath her axillary hairline. We discussed that there is a bout a 10-20% chance of having a positive node with a sentinel lymph node biopsy and we will await the permanent pathology to make any other first further decisions in terms of her treatment. One of these options might be to return to the operating room to perform an axillary lymph node dissection. We discussed about a 1-2% risk lifetime of chronic shoulder pain as well as lymphedema associated with a sentinel lymph node biopsy. We discussed the options for treatment of the breast cancer which included lumpectomy versus a mastectomy. We discussed the performance of the lumpectomy with a wire placement. We discussed a 10-20%  chance of a positive margin requiring reexcision in the operating room. We also discussed that she may need radiation therapy or antiestrogen therapy or both if she undergoes lumpectomy. We discussed the mastectomy and the postoperative care for that as well. We discussed that there is no difference in her survival whether she undergoes  lumpectomy with radiation therapy or antiestrogen therapy versus a mastectomy. There is a slight difference in the local recurrence rate being 3-5% with lumpectomy and about 1% with a mastectomy. We discussed the risks of operation including bleeding, infection, possible reoperation. She understands her further therapy will be based on what her stages at the time of her operation.  Pt Education - ABC (After Breast Cancer) Class Info: discussed with patient and provided information. Use of a central venous catheter for intravenous therapy was discussed. Technique of catheter placement using ultrasound and fluoroscopy guidance was discussed. Risks such as bleeding, infection, pneumothorax, catheter occlusion, reoperation, and other risks were discussed. I noted a good likelihood this will help address the problem. Questions were answered. The patient expressed understanding & wishes to proceed.

## 2020-05-12 ENCOUNTER — Encounter: Payer: Self-pay | Admitting: Genetic Counselor

## 2020-05-12 DIAGNOSIS — Z801 Family history of malignant neoplasm of trachea, bronchus and lung: Secondary | ICD-10-CM

## 2020-05-12 DIAGNOSIS — Z803 Family history of malignant neoplasm of breast: Secondary | ICD-10-CM | POA: Insufficient documentation

## 2020-05-12 HISTORY — DX: Family history of malignant neoplasm of trachea, bronchus and lung: Z80.1

## 2020-05-12 HISTORY — DX: Family history of malignant neoplasm of breast: Z80.3

## 2020-05-13 ENCOUNTER — Telehealth: Payer: Self-pay | Admitting: Hematology and Oncology

## 2020-05-13 ENCOUNTER — Other Ambulatory Visit: Payer: Self-pay | Admitting: Surgery

## 2020-05-13 DIAGNOSIS — C50911 Malignant neoplasm of unspecified site of right female breast: Secondary | ICD-10-CM

## 2020-05-13 NOTE — Telephone Encounter (Signed)
Scheduled follow-up appointments per 3/11 schedule message. Patient is aware.

## 2020-05-19 ENCOUNTER — Encounter: Payer: Self-pay | Admitting: *Deleted

## 2020-05-19 ENCOUNTER — Telehealth: Payer: Self-pay | Admitting: *Deleted

## 2020-05-19 NOTE — Telephone Encounter (Signed)
Spoke with patient to follow up from [facility] 3/9 and assess navigation needs.  Patient denies any concerns at this time.  Encouraged her to call should anything arise.Patient verbalized understanding.

## 2020-05-20 ENCOUNTER — Encounter: Payer: Self-pay | Admitting: Genetic Counselor

## 2020-05-20 ENCOUNTER — Telehealth: Payer: Self-pay | Admitting: Genetic Counselor

## 2020-05-20 ENCOUNTER — Other Ambulatory Visit: Payer: Self-pay

## 2020-05-20 ENCOUNTER — Ambulatory Visit (HOSPITAL_COMMUNITY)
Admission: RE | Admit: 2020-05-20 | Discharge: 2020-05-20 | Disposition: A | Discharge status: Home / Self Care | Payer: 59 | Source: Ambulatory Visit | Attending: Hematology and Oncology | Admitting: Hematology and Oncology

## 2020-05-20 DIAGNOSIS — G4730 Sleep apnea, unspecified: Secondary | ICD-10-CM | POA: Diagnosis not present

## 2020-05-20 DIAGNOSIS — Z1379 Encounter for other screening for genetic and chromosomal anomalies: Secondary | ICD-10-CM | POA: Insufficient documentation

## 2020-05-20 DIAGNOSIS — Z01810 Encounter for preprocedural cardiovascular examination: Secondary | ICD-10-CM | POA: Insufficient documentation

## 2020-05-20 DIAGNOSIS — I10 Essential (primary) hypertension: Secondary | ICD-10-CM | POA: Insufficient documentation

## 2020-05-20 DIAGNOSIS — Z170 Estrogen receptor positive status [ER+]: Secondary | ICD-10-CM | POA: Diagnosis not present

## 2020-05-20 DIAGNOSIS — C50511 Malignant neoplasm of lower-outer quadrant of right female breast: Secondary | ICD-10-CM

## 2020-05-20 DIAGNOSIS — Z0189 Encounter for other specified special examinations: Secondary | ICD-10-CM

## 2020-05-20 LAB — ECHOCARDIOGRAM COMPLETE
Area-P 1/2: 3.53 cm2
S' Lateral: 2.5 cm

## 2020-05-20 NOTE — Telephone Encounter (Signed)
Contacted patient in attempt to disclose results of genetic testing.  No answer and voicemail not set up to LVM.

## 2020-05-20 NOTE — Progress Notes (Signed)
  Echocardiogram 2D Echocardiogram has been performed.  Krishna Heuer G Jori Thrall 05/20/2020, 10:06 AM

## 2020-05-24 ENCOUNTER — Ambulatory Visit: Payer: Self-pay | Admitting: Genetic Counselor

## 2020-05-24 DIAGNOSIS — Z1379 Encounter for other screening for genetic and chromosomal anomalies: Secondary | ICD-10-CM

## 2020-05-24 DIAGNOSIS — C50511 Malignant neoplasm of lower-outer quadrant of right female breast: Secondary | ICD-10-CM

## 2020-05-24 DIAGNOSIS — Z803 Family history of malignant neoplasm of breast: Secondary | ICD-10-CM

## 2020-05-24 DIAGNOSIS — Z801 Family history of malignant neoplasm of trachea, bronchus and lung: Secondary | ICD-10-CM

## 2020-05-24 DIAGNOSIS — Z170 Estrogen receptor positive status [ER+]: Secondary | ICD-10-CM

## 2020-05-24 NOTE — Telephone Encounter (Signed)
Revealed negative genetic testing.  Discussed that we do not know why she has breast cancer or why there is cancer in the family. It could be sporadic, due to a different gene that we are not testing, or maybe our current technology may not be able to pick something up.  It will be important for her to keep in contact with genetics to keep up with whether additional testing may be needed.

## 2020-05-24 NOTE — Progress Notes (Signed)
HPI:  Ms. Seim was previously seen in the [place] clinic due to a personal and family history of cancer and concerns regarding a hereditary predisposition to cancer. Please refer to our prior cancer genetics clinic note for more information regarding our discussion, assessment and recommendations, at the time. Ms. Gondek recent genetic test results were disclosed to her, as were recommendations warranted by these results. These results and recommendations are discussed in more detail below.  CANCER HISTORY:  Oncology History  Malignant neoplasm of lower-outer quadrant of right breast of female, estrogen receptor positive ([place])  05/03/2020 Initial Diagnosis   Screening mammogram showed a 1.0cm mass at the 6 o'clock position in the right breast. Diagnostic mammogram and US showed the 1.2cm mass at the 6 o'clock position in the right breast. Biopsy showed IDC, grade 3, HER-2 positive (3+), ER 15% weak, PR-, Ki67 80%.   05/11/2020 Cancer Staging   Staging form: Breast, AJCC 8th Edition - Clinical stage from 05/11/2020: Stage IA (cT1b, cN0, cM0, G3, ER+, PR-, HER2+) - Signed by Nicholas Lose, MD on 05/11/2020 Stage prefix: Initial diagnosis   05/19/2020 Genetic Testing   Negative hereditary cancer genetic testing: no pathogenic variants detected in [facility] Breast STAT Panel and Common Hereditary Cancers Panel.  The report dates are May 19, 2020 (STAT) and May 23, 2020 (Common Hereditary).   The Common Hereditary Cancers Panel offered by [facility] includes sequencing and/or deletion duplication testing of the following 47 genes: APC, ATM, AXIN2, BARD1, BMPR1A, BRCA1, BRCA2, BRIP1, CDH1, CDK4, CDKN2A (p14ARF), CDKN2A (p16INK4a), CHEK2, CTNNA1, DICER1, EPCAM (Deletion/duplication testing only), GREM1 (promoter region deletion/duplication testing only), GREM1, HOXB13, KIT, MEN1, MLH1, MSH2, MSH3, MSH6, MUTYH, NBN, NF1, NHTL1, PALB2, PDGFRA, PMS2, POLD1, POLE, PTEN, RAD50, RAD51C, RAD51D,  SDHA, SDHB, SDHC, SDHD, SMAD4, SMARCA4. STK11, TP53, TSC1, TSC2, and VHL.  The following genes were evaluated for sequence changes only: SDHA and HOXB13 c.251G>A variant only.es only: SDHA and HOXB13 c.251G>A variant only.     FAMILY HISTORY:  We obtained a detailed, 4-generation family history.  Significant diagnoses are listed below: Family History  Problem Relation Age of Onset  . Bone cancer Maternal Grandfather        dx after 89  . Breast cancer Maternal Aunt        dx 46s  . Lung cancer Maternal Aunt        dx after 19     Ms. Riede has one daughter and one son, both without cancer.  Her paternal half brothers and sister do not have a cancer history.   Ms. Haskins mother is living at age 51 without cancer.  Ms. Lafontant mother's maternal half sister had breast cancer in her 10s.  No prior genetic testing was reported.  Ms. Urbanowicz also has a maternal aunt with breast cancer and a maternal grandfather with a history of bone cancer after the age of 58. Ms. Dykman father died at age 8 and did not have cancer.  No paternal family history of cancer was reported.   Ms. Radich is unaware of previous family history of genetic testing for hereditary cancer risks. Patient's maternal ancestors are of African American descent, and paternal ancestors are of African American descent. There is no reported Ashkenazi Jewish ancestry. There is no known consanguinity.  GENETIC TEST RESULTS: Genetic testing reported out on May 23, 2020.  The [facility] Common Hereditary Cancers Panel found no pathogenic mutations. The Common Hereditary Cancers Panel offered by [facility] includes sequencing and/or deletion duplication  testing of the following 47 genes: APC, ATM, AXIN2, BARD1, BMPR1A, BRCA1, BRCA2, BRIP1, CDH1, CDK4, CDKN2A (p14ARF), CDKN2A (p16INK4a), CHEK2, CTNNA1, DICER1, EPCAM (Deletion/duplication testing only), GREM1 (promoter region deletion/duplication testing only), GREM1, HOXB13, KIT, MEN1,  MLH1, MSH2, MSH3, MSH6, MUTYH, NBN, NF1, NHTL1, PALB2, PDGFRA, PMS2, POLD1, POLE, PTEN, RAD50, RAD51C, RAD51D, SDHA, SDHB, SDHC, SDHD, SMAD4, SMARCA4. STK11, TP53, TSC1, TSC2, and VHL.  The following genes were evaluated for sequence changes only: SDHA and HOXB13 c.251G>A variant only.es only: SDHA and HOXB13 c.251G>A variant only.  The test report has been scanned into EPIC and is located under the Molecular Pathology section of the Results Review tab.  A portion of the result report is included below for reference.    We discussed with Ms. Ashbaugh that because current genetic testing is not perfect, it is possible there may be a gene mutation in one of these genes that current testing cannot detect, but that chance is small.  We also discussed, that there could be another gene that has not yet been discovered, or that we have not yet tested, that is responsible for the cancer diagnoses in the family. It is also possible there is a hereditary cause for the cancer in the family that Ms. Newgent did not inherit and therefore was not identified in her testing.  Therefore, it is important to remain in touch with cancer genetics in the future so that we can continue to offer Ms. Tillis the most up to date genetic testing.   ADDITIONAL GENETIC TESTING: We discussed with Ms. Condron that there are other genes that are associated with increased cancer risk that can be analyzed. Should Ms. Servantes wish to pursue additional genetic testing, we are happy to discuss and coordinate this testing, at any time.    CANCER SCREENING RECOMMENDATIONS: Ms. Paone test result is considered negative (normal).  This means that we have not identified a hereditary cause for her personal history of cancer at this time. Most cancers happen by chance and this negative test suggests that her cancer may fall into this category.    While reassuring, this does not definitively rule out a hereditary predisposition to cancer. It is still  possible that there could be genetic mutations that are undetectable by current technology. There could be genetic mutations in genes that have not been tested or identified to increase cancer risk.  Therefore, it is recommended she continue to follow the cancer management and screening guidelines provided by her oncology and primary healthcare provider.   An individual's cancer risk and medical management are not determined by genetic test results alone. Overall cancer risk assessment incorporates additional factors, including personal medical history, family history, and any available genetic information that may result in a personalized plan for cancer prevention and surveillance  RECOMMENDATIONS FOR FAMILY MEMBERS:  Individuals in this family might be at some increased risk of developing cancer, over the general population risk, simply due to the family history of cancer.  We recommended women in this family have a yearly mammogram beginning at age 22, or 5 years younger than the earliest onset of cancer, an annual clinical breast exam, and perform monthly breast self-exams. Women in this family should also have a gynecological exam as recommended by their primary provider. Family members should be referred for colonoscopy starting at age 36 or as recommended by their physicians.   It is also possible there is a hereditary cause for the cancer in Ms. Kroboth's family that she did not inherit  and therefore was not identified in her.  Based on Ms. Sabella's family history, we recommended her maternal aunt with a breast cancer history have genetic counseling and testing. Ms. Klutts can let us know if we can be of any assistance in coordinating genetic counseling and/or testing for this family member.   FOLLOW-UP: Lastly, we discussed with Ms. Antenucci that cancer genetics is a rapidly advancing field and it is possible that new genetic tests will be appropriate for her and/or her family members in the future.  We encouraged her to remain in contact with cancer genetics on an annual basis so we can update her personal and family histories and let her know of advances in cancer genetics that may benefit this family.   Our contact number was provided. Ms. Willison questions were answered to her satisfaction, and she knows she is welcome to call us at anytime with additional questions or concerns.   Nephtali Docken M. Joette Catching, [place], [facility] Genetic Counselor [email or web address] (P) [phone]

## 2020-05-26 ENCOUNTER — Telehealth: Payer: Self-pay | Admitting: *Deleted

## 2020-05-26 ENCOUNTER — Other Ambulatory Visit: Payer: Self-pay

## 2020-05-26 ENCOUNTER — Encounter: Payer: Self-pay | Admitting: *Deleted

## 2020-05-26 ENCOUNTER — Encounter (HOSPITAL_BASED_OUTPATIENT_CLINIC_OR_DEPARTMENT_OTHER): Payer: Self-pay | Admitting: Surgery

## 2020-05-26 NOTE — Telephone Encounter (Signed)
Patient portal message sent with instructions to send forms to [facility]. FMLA/Disability Cover sheet e-mail sent to patient.

## 2020-05-26 NOTE — Telephone Encounter (Signed)
"  Pamela [name] [phone]).  Upcoming surgery scheduled 06/02/2020.  FMLA leave forms to send; however the card I have states incorrect fax number.  Please call to provide correct fax number."  Unable to reach patient to provide [phone] and to advise of 7 - 10 business day processing time.

## 2020-05-26 NOTE — Telephone Encounter (Signed)
FYI "Message wasn't deliverable because recipients mailbox is undergoing maintenance and can't accept messages now.  Please try resending the message later."

## 2020-05-27 ENCOUNTER — Telehealth: Payer: Self-pay | Admitting: Cardiovascular Disease

## 2020-05-27 NOTE — Telephone Encounter (Signed)
Returned call to patient who states that she was calling in to let Dr. Gwenlyn Found know that she was recently diagnosed with Breast cancer and therefore she cancelled her Calcium score at this time. Patient would like to know if Dr. Gwenlyn Found has any recommendations regarding her cardiac care following surgery and her chemo/radiation. Patient states she is to have surgery on 3/31 and starts Chemo on 4/11. Advised patient I would forward message to Dr. Gwenlyn Found. Patient verbalized understanding.

## 2020-05-27 NOTE — Telephone Encounter (Signed)
Patient was recently diagnosed with Breast Cancer. She has to have surgery 3/31 to remove the lump from her R breast. Patient wanted to know if she needs to still have the CT. Please advise

## 2020-05-28 NOTE — Telephone Encounter (Signed)
OK to cancel CCS. 2D nl. Can F/U with me in 3 months

## 2020-05-30 ENCOUNTER — Other Ambulatory Visit (HOSPITAL_COMMUNITY): Payer: 59

## 2020-05-31 NOTE — Telephone Encounter (Signed)
Spoke with patient. Patient made aware of Dr. Kennon Holter advice. 3 month follow up scheduled for 7/1 at 8:45am.  Advised patient to call back to office with any issues, questions, or concerns. Patient verbalized understanding.

## 2020-05-31 NOTE — Progress Notes (Signed)

## 2020-06-01 ENCOUNTER — Other Ambulatory Visit: Payer: Self-pay | Admitting: Surgery

## 2020-06-01 ENCOUNTER — Ambulatory Visit
Admission: RE | Admit: 2020-06-01 | Discharge: 2020-06-01 | Disposition: A | Discharge status: Home / Self Care | Payer: 59 | Source: Ambulatory Visit | Attending: Surgery | Admitting: Surgery

## 2020-06-01 ENCOUNTER — Other Ambulatory Visit: Payer: Self-pay

## 2020-06-01 ENCOUNTER — Ambulatory Visit: Payer: 59 | Admitting: Cardiovascular Disease

## 2020-06-01 DIAGNOSIS — C50911 Malignant neoplasm of unspecified site of right female breast: Secondary | ICD-10-CM

## 2020-06-01 DIAGNOSIS — Z170 Estrogen receptor positive status [ER+]: Secondary | ICD-10-CM

## 2020-06-01 NOTE — Progress Notes (Signed)
Covid positive test results in paper chart.

## 2020-06-02 ENCOUNTER — Encounter (HOSPITAL_BASED_OUTPATIENT_CLINIC_OR_DEPARTMENT_OTHER)
Admission: RE | Disposition: A | Discharge status: Home / Self Care | Payer: Self-pay | Source: Ambulatory Visit | Attending: Surgery

## 2020-06-02 ENCOUNTER — Ambulatory Visit (HOSPITAL_COMMUNITY): Payer: 59

## 2020-06-02 ENCOUNTER — Ambulatory Visit
Admission: RE | Admit: 2020-06-02 | Discharge: 2020-06-02 | Disposition: A | Discharge status: Home / Self Care | Payer: 59 | Source: Ambulatory Visit | Attending: Surgery | Admitting: Surgery

## 2020-06-02 ENCOUNTER — Ambulatory Visit (HOSPITAL_BASED_OUTPATIENT_CLINIC_OR_DEPARTMENT_OTHER): Payer: 59 | Admitting: Anesthesiology

## 2020-06-02 ENCOUNTER — Other Ambulatory Visit: Payer: Self-pay

## 2020-06-02 ENCOUNTER — Ambulatory Visit (HOSPITAL_COMMUNITY)
Admission: RE | Admit: 2020-06-02 | Discharge: 2020-06-02 | Disposition: A | Discharge status: Home / Self Care | Payer: 59 | Source: Ambulatory Visit | Attending: Surgery | Admitting: Surgery

## 2020-06-02 ENCOUNTER — Encounter (HOSPITAL_BASED_OUTPATIENT_CLINIC_OR_DEPARTMENT_OTHER): Payer: Self-pay | Admitting: Surgery

## 2020-06-02 ENCOUNTER — Ambulatory Visit (HOSPITAL_BASED_OUTPATIENT_CLINIC_OR_DEPARTMENT_OTHER)
Admission: RE | Admit: 2020-06-02 | Discharge: 2020-06-02 | Disposition: A | Discharge status: Home / Self Care | Payer: 59 | Source: Ambulatory Visit | Attending: Surgery | Admitting: Surgery

## 2020-06-02 DIAGNOSIS — Z170 Estrogen receptor positive status [ER+]: Secondary | ICD-10-CM | POA: Insufficient documentation

## 2020-06-02 DIAGNOSIS — C50911 Malignant neoplasm of unspecified site of right female breast: Secondary | ICD-10-CM | POA: Insufficient documentation

## 2020-06-02 DIAGNOSIS — Z95828 Presence of other vascular implants and grafts: Secondary | ICD-10-CM

## 2020-06-02 DIAGNOSIS — C773 Secondary and unspecified malignant neoplasm of axilla and upper limb lymph nodes: Secondary | ICD-10-CM | POA: Insufficient documentation

## 2020-06-02 HISTORY — PX: BREAST LUMPECTOMY WITH RADIOACTIVE SEED AND SENTINEL LYMPH NODE BIOPSY: SHX6550

## 2020-06-02 HISTORY — PX: PORTACATH PLACEMENT: SHX2246

## 2020-06-02 LAB — POCT PREGNANCY, URINE: Preg Test, Ur: NEGATIVE

## 2020-06-02 SURGERY — BREAST LUMPECTOMY WITH RADIOACTIVE SEED AND SENTINEL LYMPH NODE BIOPSY
Anesthesia: General | Site: Chest | Laterality: Right

## 2020-06-02 MED ORDER — FENTANYL CITRATE (PF) 100 MCG/2ML IJ SOLN
INTRAMUSCULAR | Status: AC
  Filled 2020-06-02: qty 2

## 2020-06-02 MED ORDER — CEFAZOLIN SODIUM-DEXTROSE 2-4 GM/100ML-% IV SOLN
2.0000 g | INTRAVENOUS | Status: AC
  Administered 2020-06-02: 2 g via INTRAVENOUS

## 2020-06-02 MED ORDER — FENTANYL CITRATE (PF) 100 MCG/2ML IJ SOLN
100.0000 ug | Freq: Once | INTRAMUSCULAR | Status: AC
  Administered 2020-06-02: 50 ug via INTRAVENOUS

## 2020-06-02 MED ORDER — CEFAZOLIN SODIUM-DEXTROSE 2-4 GM/100ML-% IV SOLN
INTRAVENOUS | Status: AC
  Filled 2020-06-02: qty 100

## 2020-06-02 MED ORDER — FENTANYL CITRATE (PF) 100 MCG/2ML IJ SOLN
INTRAMUSCULAR | Status: DC | PRN
  Administered 2020-06-02 (×5): 25 ug via INTRAVENOUS
  Administered 2020-06-02: 50 ug via INTRAVENOUS
  Administered 2020-06-02: 25 ug via INTRAVENOUS

## 2020-06-02 MED ORDER — LIDOCAINE HCL 1 % IJ SOLN
INTRAMUSCULAR | Status: DC | PRN
  Administered 2020-06-02: 80 mg via INTRADERMAL

## 2020-06-02 MED ORDER — OXYCODONE HCL 5 MG PO TABS
ORAL_TABLET | ORAL | Status: AC
  Filled 2020-06-02: qty 1

## 2020-06-02 MED ORDER — OXYCODONE HCL 5 MG PO TABS
5.0000 mg | ORAL_TABLET | Freq: Once | ORAL | Status: AC | PRN
  Administered 2020-06-02: 5 mg via ORAL

## 2020-06-02 MED ORDER — SODIUM CHLORIDE 0.9 % IV SOLN
INTRAVENOUS | Status: DC | PRN
  Administered 2020-06-02: 500 mL

## 2020-06-02 MED ORDER — ONDANSETRON HCL 4 MG/2ML IJ SOLN
INTRAMUSCULAR | Status: DC | PRN
  Administered 2020-06-02: 4 mg via INTRAVENOUS

## 2020-06-02 MED ORDER — EPHEDRINE 5 MG/ML INJ
INTRAVENOUS | Status: AC
  Filled 2020-06-02: qty 10

## 2020-06-02 MED ORDER — SUGAMMADEX SODIUM 500 MG/5ML IV SOLN
INTRAVENOUS | Status: DC | PRN
  Administered 2020-06-02: 400 mg via INTRAVENOUS

## 2020-06-02 MED ORDER — ALBUTEROL SULFATE HFA 108 (90 BASE) MCG/ACT IN AERS
INHALATION_SPRAY | RESPIRATORY_TRACT | Status: DC | PRN
  Administered 2020-06-02: 5 via RESPIRATORY_TRACT

## 2020-06-02 MED ORDER — TECHNETIUM TC 99M TILMANOCEPT KIT
1.0000 | PACK | Freq: Once | INTRAVENOUS | Status: AC | PRN
  Administered 2020-06-02: 1 via INTRADERMAL

## 2020-06-02 MED ORDER — EPHEDRINE SULFATE 50 MG/ML IJ SOLN
INTRAMUSCULAR | Status: DC | PRN
  Administered 2020-06-02 (×2): 10 mg via INTRAVENOUS
  Administered 2020-06-02: 5 mg via INTRAVENOUS
  Administered 2020-06-02: 10 mg via INTRAVENOUS

## 2020-06-02 MED ORDER — IBUPROFEN 800 MG PO TABS: 800.0000 mg | ORAL_TABLET | Freq: Three times a day (TID) | ORAL | 0 refills | Status: DC | PRN

## 2020-06-02 MED ORDER — OXYCODONE HCL 5 MG PO TABS: 5.0000 mg | ORAL_TABLET | Freq: Four times a day (QID) | ORAL | 0 refills | Status: DC | PRN

## 2020-06-02 MED ORDER — BUPIVACAINE-EPINEPHRINE 0.25% -1:200000 IJ SOLN
INTRAMUSCULAR | Status: DC | PRN
  Administered 2020-06-02: 27 mL

## 2020-06-02 MED ORDER — GLYCOPYRROLATE 0.2 MG/ML IJ SOLN
INTRAMUSCULAR | Status: DC | PRN
  Administered 2020-06-02 (×2): .1 mg via INTRAVENOUS

## 2020-06-02 MED ORDER — PHENYLEPHRINE 40 MCG/ML (10ML) SYRINGE FOR IV PUSH (FOR BLOOD PRESSURE SUPPORT)
PREFILLED_SYRINGE | INTRAVENOUS | Status: AC
  Filled 2020-06-02: qty 10

## 2020-06-02 MED ORDER — HEPARIN (PORCINE) IN NACL 1000-0.9 UT/500ML-% IV SOLN
INTRAVENOUS | Status: AC
  Filled 2020-06-02: qty 500

## 2020-06-02 MED ORDER — CLONIDINE HCL (ANALGESIA) 100 MCG/ML EP SOLN
EPIDURAL | Status: DC | PRN
  Administered 2020-06-02: 50 ug

## 2020-06-02 MED ORDER — BUPIVACAINE-EPINEPHRINE (PF) 0.5% -1:200000 IJ SOLN
INTRAMUSCULAR | Status: DC | PRN
  Administered 2020-06-02: 30 mL via PERINEURAL

## 2020-06-02 MED ORDER — SUGAMMADEX SODIUM 500 MG/5ML IV SOLN
INTRAVENOUS | Status: AC
  Filled 2020-06-02: qty 5

## 2020-06-02 MED ORDER — VANCOMYCIN HCL 500 MG IV SOLR
INTRAVENOUS | Status: AC
  Filled 2020-06-02: qty 500

## 2020-06-02 MED ORDER — CHLORHEXIDINE GLUCONATE CLOTH 2 % EX PADS: 6.0000 | MEDICATED_PAD | Freq: Once | CUTANEOUS | Status: DC

## 2020-06-02 MED ORDER — SODIUM CHLORIDE (PF) 0.9 % IJ SOLN
INTRAMUSCULAR | Status: AC
  Filled 2020-06-02: qty 10

## 2020-06-02 MED ORDER — LIDOCAINE 2% (20 MG/ML) 5 ML SYRINGE
INTRAMUSCULAR | Status: AC
  Filled 2020-06-02: qty 5

## 2020-06-02 MED ORDER — PROPOFOL 10 MG/ML IV BOLUS
INTRAVENOUS | Status: AC
  Filled 2020-06-02: qty 40

## 2020-06-02 MED ORDER — SUCCINYLCHOLINE CHLORIDE 20 MG/ML IJ SOLN
INTRAMUSCULAR | Status: DC | PRN
  Administered 2020-06-02: 140 mg via INTRAVENOUS

## 2020-06-02 MED ORDER — PHENYLEPHRINE HCL-NACL 10-0.9 MG/250ML-% IV SOLN
INTRAVENOUS | Status: DC | PRN
  Administered 2020-06-02: 50 ug/min via INTRAVENOUS

## 2020-06-02 MED ORDER — HEPARIN SOD (PORK) LOCK FLUSH 100 UNIT/ML IV SOLN
INTRAVENOUS | Status: AC
  Filled 2020-06-02: qty 5

## 2020-06-02 MED ORDER — ROCURONIUM BROMIDE 100 MG/10ML IV SOLN
INTRAVENOUS | Status: DC | PRN
  Administered 2020-06-02: 40 mg via INTRAVENOUS
  Administered 2020-06-02: 10 mg via INTRAVENOUS
  Administered 2020-06-02 (×2): 20 mg via INTRAVENOUS

## 2020-06-02 MED ORDER — FENTANYL CITRATE (PF) 100 MCG/2ML IJ SOLN: 25.0000 ug | INTRAMUSCULAR | Status: DC | PRN

## 2020-06-02 MED ORDER — ALBUTEROL SULFATE HFA 108 (90 BASE) MCG/ACT IN AERS
INHALATION_SPRAY | RESPIRATORY_TRACT | Status: AC
  Filled 2020-06-02: qty 6.7

## 2020-06-02 MED ORDER — METHYLENE BLUE 0.5 % INJ SOLN
INTRAVENOUS | Status: AC
  Filled 2020-06-02: qty 10

## 2020-06-02 MED ORDER — PROMETHAZINE HCL 25 MG/ML IJ SOLN: 6.2500 mg | INTRAMUSCULAR | Status: DC | PRN

## 2020-06-02 MED ORDER — VANCOMYCIN HCL 500 MG IV SOLR
INTRAVENOUS | Status: DC | PRN
  Administered 2020-06-02: 500 mg via TOPICAL

## 2020-06-02 MED ORDER — PROPOFOL 10 MG/ML IV BOLUS
INTRAVENOUS | Status: DC | PRN
  Administered 2020-06-02: 50 mg via INTRAVENOUS
  Administered 2020-06-02: 170 mg via INTRAVENOUS
  Administered 2020-06-02 (×2): 50 mg via INTRAVENOUS

## 2020-06-02 MED ORDER — DEXAMETHASONE SODIUM PHOSPHATE 10 MG/ML IJ SOLN
INTRAMUSCULAR | Status: AC
  Filled 2020-06-02: qty 1

## 2020-06-02 MED ORDER — HEPARIN SOD (PORK) LOCK FLUSH 100 UNIT/ML IV SOLN
INTRAVENOUS | Status: DC | PRN
  Administered 2020-06-02: 400 [IU]

## 2020-06-02 MED ORDER — ACETAMINOPHEN 500 MG PO TABS
ORAL_TABLET | ORAL | Status: AC
  Filled 2020-06-02: qty 2

## 2020-06-02 MED ORDER — PHENYLEPHRINE HCL (PRESSORS) 10 MG/ML IV SOLN
INTRAVENOUS | Status: DC | PRN
  Administered 2020-06-02: 60 ug via INTRAVENOUS
  Administered 2020-06-02: 40 ug via INTRAVENOUS
  Administered 2020-06-02: 100 ug via INTRAVENOUS
  Administered 2020-06-02 (×2): 80 ug via INTRAVENOUS

## 2020-06-02 MED ORDER — LACTATED RINGERS IV SOLN: INTRAVENOUS | Status: DC

## 2020-06-02 MED ORDER — ONDANSETRON HCL 4 MG/2ML IJ SOLN
INTRAMUSCULAR | Status: AC
  Filled 2020-06-02: qty 2

## 2020-06-02 MED ORDER — GLYCOPYRROLATE PF 0.2 MG/ML IJ SOSY
PREFILLED_SYRINGE | INTRAMUSCULAR | Status: AC
  Filled 2020-06-02: qty 1

## 2020-06-02 MED ORDER — HEPARIN (PORCINE) IN NACL 2-0.9 UNITS/ML
INTRAMUSCULAR | Status: AC | PRN
  Administered 2020-06-02: 1 via INTRAVENOUS

## 2020-06-02 MED ORDER — ACETAMINOPHEN 500 MG PO TABS
1000.0000 mg | ORAL_TABLET | ORAL | Status: AC
  Administered 2020-06-02: 1000 mg via ORAL

## 2020-06-02 MED ORDER — DEXAMETHASONE SODIUM PHOSPHATE 10 MG/ML IJ SOLN
INTRAMUSCULAR | Status: DC | PRN
  Administered 2020-06-02: 10 mg via INTRAVENOUS

## 2020-06-02 MED ORDER — MIDAZOLAM HCL 2 MG/2ML IJ SOLN
2.0000 mg | Freq: Once | INTRAMUSCULAR | Status: AC
  Administered 2020-06-02: 2 mg via INTRAVENOUS

## 2020-06-02 MED ORDER — MIDAZOLAM HCL 2 MG/2ML IJ SOLN
INTRAMUSCULAR | Status: AC
  Filled 2020-06-02: qty 2

## 2020-06-02 SURGICAL SUPPLY — 73 items
ADH SKN CLS APL DERMABOND .7 (GAUZE/BANDAGES/DRESSINGS) ×2
APL PRP STRL LF DISP 70% ISPRP (MISCELLANEOUS) ×4
APL SKNCLS STERI-STRIP NONHPOA (GAUZE/BANDAGES/DRESSINGS)
APPLIER CLIP 9.375 MED OPEN (MISCELLANEOUS) ×4
APR CLP MED 9.3 20 MLT OPN (MISCELLANEOUS) ×2
BAG DECANTER FOR FLEXI CONT (MISCELLANEOUS) ×4 IMPLANT
BENZOIN TINCTURE PRP APPL 2/3 (GAUZE/BANDAGES/DRESSINGS) IMPLANT
BINDER BREAST 3XL (GAUZE/BANDAGES/DRESSINGS) ×2 IMPLANT
BINDER BREAST LRG (GAUZE/BANDAGES/DRESSINGS) IMPLANT
BINDER BREAST MEDIUM (GAUZE/BANDAGES/DRESSINGS) IMPLANT
BINDER BREAST XLRG (GAUZE/BANDAGES/DRESSINGS) IMPLANT
BINDER BREAST XXLRG (GAUZE/BANDAGES/DRESSINGS) IMPLANT
BLADE HEX COATED 2.75 (ELECTRODE) ×4 IMPLANT
BLADE SURG 11 STRL SS (BLADE) ×4 IMPLANT
BLADE SURG 15 STRL LF DISP TIS (BLADE) ×2 IMPLANT
BLADE SURG 15 STRL SS (BLADE) ×8
CANISTER SUC SOCK COL 7IN (MISCELLANEOUS) IMPLANT
CANISTER SUCT 1200ML W/VALVE (MISCELLANEOUS) ×4 IMPLANT
CHLORAPREP W/TINT 26 (MISCELLANEOUS) ×6 IMPLANT
CLIP APPLIE 9.375 MED OPEN (MISCELLANEOUS) ×2 IMPLANT
CLOSURE WOUND 1/2 X4 (GAUZE/BANDAGES/DRESSINGS)
COVER BACK TABLE 60X90IN (DRAPES) ×4 IMPLANT
COVER MAYO STAND STRL (DRAPES) ×4 IMPLANT
COVER PROBE 5X48 (MISCELLANEOUS) ×4
COVER PROBE W GEL 5X96 (DRAPES) ×4 IMPLANT
DECANTER SPIKE VIAL GLASS SM (MISCELLANEOUS) IMPLANT
DERMABOND ADVANCED (GAUZE/BANDAGES/DRESSINGS) ×2
DERMABOND ADVANCED .7 DNX12 (GAUZE/BANDAGES/DRESSINGS) ×2 IMPLANT
DRAPE C-ARM 42X72 X-RAY (DRAPES) ×4 IMPLANT
DRAPE LAPAROSCOPIC ABDOMINAL (DRAPES) ×6 IMPLANT
DRAPE UTILITY XL STRL (DRAPES) ×6 IMPLANT
DRSG TEGADERM 2-3/8X2-3/4 SM (GAUZE/BANDAGES/DRESSINGS) IMPLANT
DRSG TEGADERM 4X4.75 (GAUZE/BANDAGES/DRESSINGS) IMPLANT
ELECT COATED BLADE 2.86 ST (ELECTRODE) ×4 IMPLANT
ELECT REM PT RETURN 9FT ADLT (ELECTROSURGICAL) ×4
ELECTRODE REM PT RTRN 9FT ADLT (ELECTROSURGICAL) ×2 IMPLANT
GAUZE SPONGE 4X4 12PLY STRL LF (GAUZE/BANDAGES/DRESSINGS) IMPLANT
GLOVE SRG 8 PF TXTR STRL LF DI (GLOVE) ×2 IMPLANT
GLOVE SURG POLYISO LF SZ6.5 (GLOVE) ×4 IMPLANT
GLOVE SURG POLYISO LF SZ7 (GLOVE) ×2 IMPLANT
GLOVE SURG POLYISO LF SZ8 (GLOVE) ×4 IMPLANT
GLOVE SURG UNDER POLY LF SZ8 (GLOVE) ×8
GOWN STRL REUS W/ TWL LRG LVL3 (GOWN DISPOSABLE) ×4 IMPLANT
GOWN STRL REUS W/ TWL XL LVL3 (GOWN DISPOSABLE) ×2 IMPLANT
GOWN STRL REUS W/TWL LRG LVL3 (GOWN DISPOSABLE) ×8
GOWN STRL REUS W/TWL XL LVL3 (GOWN DISPOSABLE) ×12
HEMOSTAT ARISTA ABSORB 3G PWDR (HEMOSTASIS) IMPLANT
HEMOSTAT SNOW SURGICEL 2X4 (HEMOSTASIS) IMPLANT
KIT CVR 48X5XPRB PLUP LF (MISCELLANEOUS) ×2 IMPLANT
KIT MARKER MARGIN INK (KITS) ×4 IMPLANT
KIT PORT POWER 8FR ISP CVUE (Port) ×2 IMPLANT
NDL HYPO 25X1 1.5 SAFETY (NEEDLE) ×2 IMPLANT
NEEDLE HYPO 25X1 1.5 SAFETY (NEEDLE) ×4 IMPLANT
NS IRRIG 1000ML POUR BTL (IV SOLUTION) ×4 IMPLANT
PACK BASIN DAY SURGERY FS (CUSTOM PROCEDURE TRAY) ×4 IMPLANT
PENCIL SMOKE EVACUATOR (MISCELLANEOUS) ×4 IMPLANT
SLEEVE SCD COMPRESS KNEE MED (STOCKING) ×4 IMPLANT
SPONGE LAP 4X18 RFD (DISPOSABLE) ×6 IMPLANT
STRIP CLOSURE SKIN 1/2X4 (GAUZE/BANDAGES/DRESSINGS) IMPLANT
SUT MNCRL AB 4-0 PS2 18 (SUTURE) ×4 IMPLANT
SUT MON AB 4-0 PC3 18 (SUTURE) ×4 IMPLANT
SUT PROLENE 2 0 CT2 30 (SUTURE) IMPLANT
SUT PROLENE 2 0 SH DA (SUTURE) ×4 IMPLANT
SUT SILK 2 0 TIES 17X18 (SUTURE)
SUT SILK 2-0 18XBRD TIE BLK (SUTURE) IMPLANT
SUT VICRYL 3-0 CR8 SH (SUTURE) ×4 IMPLANT
SYR 5ML LUER SLIP (SYRINGE) ×4 IMPLANT
SYR CONTROL 10ML LL (SYRINGE) ×4 IMPLANT
TOWEL GREEN STERILE FF (TOWEL DISPOSABLE) ×8 IMPLANT
TRAY FAXITRON CT DISP (TRAY / TRAY PROCEDURE) ×4 IMPLANT
TUBE CONNECTING 20'X1/4 (TUBING) ×1
TUBE CONNECTING 20X1/4 (TUBING) ×3 IMPLANT
YANKAUER SUCT BULB TIP NO VENT (SUCTIONS) ×4 IMPLANT

## 2020-06-02 NOTE — Op Note (Addendum)
Preoperative diagnosis: Stage I right breast cancer  Postoperative diagnosis: Same  Procedure: Right breast seed localized lumpectomy with right axillary sentinel lymph node mapping and placement of right 8 French internal jugular port with C arm and ultrasound guidance  Surgeon: [name], MD  Anesthesia: General with pectoral block local anesthetic of 0.25% Marcaine plain  EBL: 40 cc  Drains: None  Specimen: right  breast mass with seed and clip verified by Faxitron as well as additional margins and 2 right axillary sentinel nodes  Drains: None  IV fluids: Per anesthesia record  Indications for procedure: The patient presents for treatment of a right breast cancer.  She was seen by medical oncologist, radiation oncologist and surgery.  She opted for right breast lumpectomy for breast conserving surgery.  She will require port placement for postoperative chemotherapy.  All options were discussed.  Complications of surgery discussed.The procedure has been discussed with the patient. Alternatives to surgery have been discussed with the patient.  Risks of surgery include bleeding,  Infection,  Seroma formation, death,  and the need for further surgery.   The patient understands and wishes to proceed.Sentinel lymph node mapping and dissection has been discussed with the patient.  Risk of bleeding,  Infection,  Seroma formation,  Additional procedures,,  Shoulder weakness ,  Shoulder stiffness,  Nerve and blood vessel injury lymphedema and reaction to the mapping dyes have been discussed.  Alternatives to surgery have been discussed with the patient.  Port placement risk includes bleeding, infection, pneumothorax, hemothorax, injury to cardiac structures, injury to major vascular structures, replacement, migration, malfunction, infection, DVT, death, and the need further treatments and/or procedures.  The patient agrees to proceed.  Description of procedure: Patient seen in the holding area.   All questions were answered.  She underwent pectoral block per anesthesia protocol as well as injection of technetium sulfur colloid.  All questions were answered.  Right side was marked as the correct side.  She was then taken back to the operating room.  She is placed supine upon the OR table.  After induction of general esthesia, right breast was prepped and draped in a sterile fashion.  The Port-A-Cath was placed first.  Using ultrasound and C-arm guidance the right internal jugular vein was identified.  With the patient in Trendelenburg a needle was placed in the return of dark nonpulsatile blood was observed and a wire was fed through this.  C-arm showed the wire going down the superior vena cava.  Small incision was made below the right clavicle.  Small pocket was made with cautery.  A small stab incision was placed at the wire insertion site.  The port was brought on the field and cut to 20 cm.  It was then tunneled from the lower incision to the upper incision after being attached.  It had been flushed previously.  We then placed the dilator introducer complex of the wire moving it to and fro with no resistance encountered.  The dilator and wire were removed.  The cath was placed on the peel-away sheath and the sheath was peeled away without difficulty.  C-arm image showed the tip to be in the very distal vena cava.  She had no ectopy.  It drew back easily and flushed easily.  It was secured to the chest wall with 2-0 Prolene.  There is no kinking or twisting the catheter.  We then closed the incisions with a combination of 3-0 Vicryl and 4-0 Monocryl.  All counts were found  to be correct.  The patient was reprepped and redraped.  The right breast was then examined with the neoprobe.  The seed was identified with the help of the films.  This in the lower central breast.  I used a previous breast reduction scars along the inframammary fold to gain access.  Dissection was carried down to all tissue around  the seed clip were excised with a grossly negative margin.  Cautery used for hemostasis.  Faxitron image revealed the seed and clip to be present.  I took additional margins and these were oriented with ink as was the specimen is sent to pathology.  Cavities irrigated with antibiotic solution.  Local anesthetic infiltrated.  Clips placed to mark the cavity and the cavity closed with 3-0 Vicryl and 4-0 Monocryl.  Neoprobe settings changed technetium.  The right axilla was examined.  Hotspot identified and incision made of about 4 cm.  Dissection was carried down into the level 1 lymph node basin.  There was 2 hot nodes identified with the neoprobe.  These were removed.  Background counts approached 0.  Long thoracic nerve, thoracodorsal trunk and axillary vein were all preserved.  Hemostasis achieved with cautery.  Irrigation used.  Arista placed well as vancomycin powder.  Hemostasis was excellent.  Wound closed with 3-0 Vicryl and 4-0 Monocryl.  Dermabond applied.  All counts were found to be correct.  Breast binder placed.  The patient was awoke extubated taken to recovery in satisfactory condition.

## 2020-06-02 NOTE — Anesthesia Procedure Notes (Signed)
Procedure Name: Intubation Date/Time: 06/02/2020 9:22 AM Performed by: Garrel Ridgel, CRNA Pre-anesthesia Checklist: Patient identified, Emergency Drugs available, Suction available and Patient being monitored Patient Re-evaluated:Patient Re-evaluated prior to induction Oxygen Delivery Method: Circle system utilized Preoxygenation: Pre-oxygenation with 100% oxygen Induction Type: IV induction Ventilation: Mask ventilation without difficulty Laryngoscope Size: Glidescope Grade View: Grade II Tube type: Oral Tube size: 7.0 mm Number of attempts: 1 Airway Equipment and Method: Stylet,  Oral airway and Video-laryngoscopy Placement Confirmation: ETT inserted through vocal cords under direct vision,  positive ETCO2 and breath sounds checked- equal and bilateral Secured at: 24 cm Tube secured with: Tape Dental Injury: Teeth and Oropharynx as per pre-operative assessment  Difficulty Due To: Difficulty was anticipated

## 2020-06-02 NOTE — Transfer of Care (Signed)
Immediate Anesthesia Transfer of Care Note  Patient: Pamela [name]  Procedure(s) Performed: RIGHT BREAST LUMPECTOMY WITH RADIOACTIVE SEED AND RIGHT SENTINEL LYMPH NODE BIOPSY (Right Breast) INSERTION PORT-A-CATH (Right Chest)  Patient Location: PACU  Anesthesia Type:GA combined with regional for post-op pain  Level of Consciousness: drowsy  Airway & Oxygen Therapy: Patient Spontanous Breathing and Patient connected to face mask oxygen  Post-op Assessment: Report given to RN and Post -op Vital signs reviewed and stable  Post vital signs: Reviewed and stable  Last Vitals:  Vitals Value Taken Time  BP    Temp    Pulse 76 06/02/20 1137  Resp 12 06/02/20 1137  SpO2 100 % 06/02/20 1137  Vitals shown include unvalidated device data.  Last Pain:  Vitals:   06/02/20 0722  TempSrc: Oral  PainSc:          Complications: No complications documented.

## 2020-06-02 NOTE — Interval H&P Note (Signed)
History and Physical Interval Note:  06/02/2020 8:28 AM  Pamela [name]  has presented today for surgery, with the diagnosis of BREAST CANCER.  The various methods of treatment have been discussed with the patient and family. After consideration of risks, benefits and other options for treatment, the patient has consented to  Procedure(s): RIGHT BREAST LUMPECTOMY WITH RADIOACTIVE SEED AND RIGHT SENTINEL LYMPH NODE BIOPSY (Right) INSERTION PORT-A-CATH (N/A) as a surgical intervention.  The patient's history has been reviewed, patient examined, no change in status, stable for surgery.  I have reviewed the patient's chart and labs.  Questions were answered to the patient's satisfaction.     Turner Daniels MD The procedure has been discussed with the patient. Alternatives to surgery have been discussed with the patient.  Risks of surgery include bleeding,  Infection,  Seroma formation, death,  and the need for further surgery.   The patient understands and wishes to proceed.  Sentinel lymph node mapping and dissection has been discussed with the patient.  Risk of bleeding,  Infection,  Seroma formation,  Additional procedures,,  Shoulder weakness ,  Shoulder stiffness,  Nerve and blood vessel injury and reaction to the mapping dyes have been discussed.  Alternatives to surgery have been discussed with the patient.  The patient agrees to proceed.   Risk or port bleeding , infection, collapse lung , bleeding around the lung or heart,  cardiac injury, death, DVT, catheter malfunction

## 2020-06-02 NOTE — Anesthesia Postprocedure Evaluation (Signed)
Anesthesia Post Note  Patient: Pamela [name]  Procedure(s) Performed: RIGHT BREAST LUMPECTOMY WITH RADIOACTIVE SEED AND RIGHT SENTINEL LYMPH NODE BIOPSY (Right Breast) INSERTION PORT-A-CATH (Right Chest)     Patient location during evaluation: PACU Anesthesia Type: General Level of consciousness: awake and alert, awake and oriented Pain management: pain level controlled Vital Signs Assessment: post-procedure vital signs reviewed and stable Respiratory status: spontaneous breathing, nonlabored ventilation and respiratory function stable Cardiovascular status: blood pressure returned to baseline and stable Postop Assessment: no apparent nausea or vomiting Anesthetic complications: no   No complications documented.  Last Vitals:  Vitals:   06/02/20 1215 06/02/20 1300  BP: 140/68 (!) 151/78  Pulse: 96 88  Resp: 18 18  Temp:  37.2 C  SpO2: 99% 97%    Last Pain:  Vitals:   06/02/20 1300  TempSrc:   PainSc: 3                  Catalina Gravel

## 2020-06-02 NOTE — Anesthesia Procedure Notes (Signed)
Anesthesia Regional Block: Pectoralis block   Pre-Anesthetic Checklist: ,, timeout performed, Correct Patient, Correct Site, Correct Laterality, Correct Procedure, Correct Position, site marked, Risks and benefits discussed,  Surgical consent,  Pre-op evaluation,  At surgeon's request and post-op pain management  Laterality: Right  Prep: chloraprep       Needles:  Injection technique: Single-shot  Needle Type: Echogenic Needle     Needle Length: 9cm  Needle Gauge: 21     Additional Needles:   Procedures:,,,, ultrasound used (permanent image in chart),,,,  Narrative:  Start time: 06/02/2020 7:58 AM End time: 06/02/2020 8:08 AM Injection made incrementally with aspirations every 5 mL.  Performed by: Personally  Anesthesiologist: Catalina Gravel, MD  Additional Notes: No pain on injection. No increased resistance to injection. Injection made in 5cc increments.  Good needle visualization.  Patient tolerated procedure well.

## 2020-06-02 NOTE — Anesthesia Preprocedure Evaluation (Signed)
Anesthesia Evaluation  Patient identified by MRN, date of birth, ID band Patient awake    Reviewed: Allergy & Precautions, NPO status , Patient's Chart, lab work & pertinent test results  Airway Mallampati: III  TM Distance: >3 FB Neck ROM: Full    Dental  (+) Teeth Intact, Dental Advisory Given   Pulmonary asthma , sleep apnea and Continuous Positive Airway Pressure Ventilation ,    Pulmonary exam normal breath sounds clear to auscultation       Cardiovascular hypertension, Pt. on medications Normal cardiovascular exam Rhythm:Regular Rate:Normal     Neuro/Psych  Headaches,    GI/Hepatic negative GI ROS, Neg liver ROS,   Endo/Other  Morbid obesity  Renal/GU negative Renal ROS     Musculoskeletal  (+) Arthritis ,   Abdominal   Peds  Hematology negative hematology ROS (+)   Anesthesia Other Findings Day of surgery medications reviewed with the patient.  Right breast cancer   Reproductive/Obstetrics                             Anesthesia Physical Anesthesia Plan  ASA: III  Anesthesia Plan: General   Post-op Pain Management:  Regional for Post-op pain   Induction: Intravenous  PONV Risk Score and Plan: 3 and Midazolam, Dexamethasone and Ondansetron  Airway Management Planned: LMA  Additional Equipment:   Intra-op Plan:   Post-operative Plan: Extubation in OR  Informed Consent: I have reviewed the patients History and Physical, chart, labs and discussed the procedure including the risks, benefits and alternatives for the proposed anesthesia with the patient or authorized representative who has indicated his/her understanding and acceptance.     Dental advisory given  Plan Discussed with: CRNA  Anesthesia Plan Comments:         Anesthesia Quick Evaluation

## 2020-06-02 NOTE — Discharge Instructions (Signed)
[place] Office Phone Number [phone]  BREAST BIOPSY/ PARTIAL MASTECTOMY: POST OP INSTRUCTIONS  Always review your discharge instruction sheet given to you by the facility where your surgery was performed.  IF YOU HAVE DISABILITY OR FAMILY LEAVE FORMS, YOU MUST BRING THEM TO THE OFFICE FOR PROCESSING.  DO NOT GIVE THEM TO YOUR DOCTOR.  1. A prescription for pain medication may be given to you upon discharge.  Take your pain medication as prescribed, if needed.  If narcotic pain medicine is not needed, then you may take acetaminophen (Tylenol) or ibuprofen (Advil) as needed. NO Tylenol until 6:00pm if needed 2. Take your usually prescribed medications unless otherwise directed 3. If you need a refill on your pain medication, please contact your pharmacy.  They will contact our office to request authorization.  Prescriptions will not be filled after 5pm or on week-ends. 4. You should eat very light the first 24 hours after surgery, such as soup, crackers, pudding, etc.  Resume your normal diet the day after surgery. 5. Most patients will experience some swelling and bruising in the breast.  Ice packs and a good support bra will help.  Swelling and bruising can take several days to resolve.  6. It is common to experience some constipation if taking pain medication after surgery.  Increasing fluid intake and taking a stool softener will usually help or prevent this problem from occurring.  A mild laxative (Milk of Magnesia or Miralax) should be taken according to package directions if there are no bowel movements after 48 hours. 7. Unless discharge instructions indicate otherwise, you may remove your bandages 24-48 hours after surgery, and you may shower at that time.  You may have steri-strips (small skin tapes) in place directly over the incision.  These strips should be left on the skin for 7-10 days.  If your surgeon used skin glue on the incision, you may shower in 24 hours.   The glue will flake off over the next 2-3 weeks.  Any sutures or staples will be removed at the office during your follow-up visit. 8. ACTIVITIES:  You may resume regular daily activities (gradually increasing) beginning the next day.  Wearing a good support bra or sports bra minimizes pain and swelling.  You may have sexual intercourse when it is comfortable. a. You may drive when you no longer are taking prescription pain medication, you can comfortably wear a seatbelt, and you can safely maneuver your car and apply brakes. b. RETURN TO WORK:  ______________________________________________________________________________________ 9. You should see your doctor in the office for a follow-up appointment approximately two weeks after your surgery.  Your doctor's nurse will typically make your follow-up appointment when she calls you with your pathology report.  Expect your pathology report 2-3 business days after your surgery.  You may call to check if you do not hear from [place] after three days. 10. OTHER INSTRUCTIONS: _______________________________________________________________________________________________ _____________________________________________________________________________________________________________________________________ _____________________________________________________________________________________________________________________________________ _____________________________________________________________________________________________________________________________________  WHEN TO CALL YOUR DOCTOR: 1. Fever over 101.0 2. Nausea and/or vomiting. 3. Extreme swelling or bruising. 4. Continued bleeding from incision. 5. Increased pain, redness, or drainage from the incision.  The clinic staff is available to answer your questions during regular business hours.  Please don't hesitate to call and ask to speak to one of the nurses for clinical concerns.  If you have a medical  emergency, go to the nearest emergency room or call 911.  A surgeon from [facility] is always on call at the hospital.  For  further questions, please visit [email or web address]      PORT-A-CATH: POST OP INSTRUCTIONS  Always review your discharge instruction sheet given to you by the facility where your surgery was performed.   1. A prescription for pain medication may be given to you upon discharge. Take your pain medication as prescribed, if needed. If narcotic pain medicine is not needed, then you make take acetaminophen (Tylenol) or ibuprofen (Advil) as needed.  2. Take your usually prescribed medications unless otherwise directed. 3. If you need a refill on your pain medication, please contact our office. All narcotic pain medicine now requires a paper prescription.  Phoned in and fax refills are no longer allowed by law.  Prescriptions will not be filled after 5 pm or on weekends.  4. You should follow a light diet for the remainder of the day after your procedure. 5. Most patients will experience some mild swelling and/or bruising in the area of the incision. It may take several days to resolve. 6. It is common to experience some constipation if taking pain medication after surgery. Increasing fluid intake and taking a stool softener (such as Colace) will usually help or prevent this problem from occurring. A mild laxative (Milk of Magnesia or Miralax) should be taken according to package directions if there are no bowel movements after 48 hours.  7. Unless discharge instructions indicate otherwise, you may remove your bandages 48 hours after surgery, and you may shower at that time. You may have steri-strips (small white skin tapes) in place directly over the incision.  These strips should be left on the skin for 7-10 days.  If your surgeon used Dermabond (skin glue) on the incision, you may shower in 24 hours.  The glue will flake off over the next 2-3 weeks.  8. If your  port is left accessed at the end of surgery (needle left in port), the dressing cannot get wet and should only by changed by a healthcare professional. When the port is no longer accessed (when the needle has been removed), follow step 7.   9. ACTIVITIES:  Limit activity involving your arms for the next 72 hours. Do no strenuous exercise or activity for 1 week. You may drive when you are no longer taking prescription pain medication, you can comfortably wear a seatbelt, and you can maneuver your car. 10.You may need to see your doctor in the office for a follow-up appointment.  Please       check with your doctor.  11.When you receive a new Port-a-Cath, you will get a product guide and        ID card.  Please keep them in case you need them.  WHEN TO CALL YOUR DOCTOR [phone]): 1. Fever over 101.0 2. Chills 3. Continued bleeding from incision 4. Increased redness and tenderness at the site 5. Shortness of breath, difficulty breathing   The clinic staff is available to answer your questions during regular business hours. Please don't hesitate to call and ask to speak to one of the nurses or medical assistants for clinical concerns. If you have a medical emergency, go to the nearest emergency room or call 911.  A surgeon from [facility] is always on call at the hospital.     For further information, please visit [email or web address]     Post Anesthesia Home Care Instructions  Activity: Get plenty of rest for the remainder of the day. A responsible individual must stay with you for 24 hours following the procedure.  For the next 24 hours, DO NOT: -Drive a car -Paediatric nurse -Drink alcoholic beverages -Take any medication unless instructed by your physician -Make any legal decisions or sign important papers.  Meals: Start with liquid foods such as gelatin or soup. Progress to regular foods as tolerated. Avoid greasy, spicy, heavy foods. If nausea  and/or vomiting occur, drink only clear liquids until the nausea and/or vomiting subsides. Call your physician if vomiting continues.  Special Instructions/Symptoms: Your throat may feel dry or sore from the anesthesia or the breathing tube placed in your throat during surgery. If this causes discomfort, gargle with warm salt water. The discomfort should disappear within 24 hours.  If you had a scopolamine patch placed behind your ear for the management of post- operative nausea and/or vomiting:  1. The medication in the patch is effective for 72 hours, after which it should be removed.  Wrap patch in a tissue and discard in the trash. Wash hands thoroughly with soap and water. 2. You may remove the patch earlier than 72 hours if you experience unpleasant side effects which may include dry mouth, dizziness or visual disturbances. 3. Avoid touching the patch. Wash your hands with soap and water after contact with the patch.

## 2020-06-02 NOTE — Anesthesia Procedure Notes (Signed)
Procedure Name: LMA Insertion Date/Time: 06/02/2020 9:14 AM Performed by: Garrel Ridgel, CRNA Pre-anesthesia Checklist: Patient identified, Emergency Drugs available and Suction available Patient Re-evaluated:Patient Re-evaluated prior to induction Oxygen Delivery Method: Circle system utilized Preoxygenation: Pre-oxygenation with 100% oxygen Induction Type: IV induction Ventilation: Mask ventilation without difficulty LMA: LMA inserted LMA Size: 4.0 Number of attempts: 1 Placement Confirmation: positive ETCO2 Tube secured with: Tape Dental Injury: Teeth and Oropharynx as per pre-operative assessment

## 2020-06-02 NOTE — Progress Notes (Signed)
Assisted Dr. Gifford Shave with right, ultrasound guided, pectoralis block. Side rails up, monitors on throughout procedure. See vital signs in flow sheet. Tolerated Procedure well.

## 2020-06-03 ENCOUNTER — Encounter (HOSPITAL_BASED_OUTPATIENT_CLINIC_OR_DEPARTMENT_OTHER): Payer: Self-pay | Admitting: Surgery

## 2020-06-06 LAB — SURGICAL PATHOLOGY

## 2020-06-07 ENCOUNTER — Encounter: Payer: Self-pay | Admitting: *Deleted

## 2020-06-08 NOTE — Progress Notes (Signed)
Patient Care Team: [name], MD as PCP - General (Internal Medicine) Mcarthur Rossetti, MD as Consulting Physician (Orthopedic Surgery) Mauro Kaufmann, RN as Oncology Nurse Navigator Rockwell Germany, RN as Oncology Nurse Navigator [name], MD as Consulting Physician (General Surgery) Nicholas Lose, MD as Consulting Physician (Hematology and Oncology) Eppie Gibson, MD as Attending Physician (Radiation Oncology)  DIAGNOSIS:    ICD-10-CM   1. Malignant neoplasm of lower-outer quadrant of right breast of female, estrogen receptor positive ([place])  C50.511    Z17.0     SUMMARY OF ONCOLOGIC HISTORY: Oncology History  Malignant neoplasm of lower-outer quadrant of right breast of female, estrogen receptor positive ([place])  05/03/2020 Initial Diagnosis   Screening mammogram showed a 1.0cm mass at the 6 o'clock position in the right breast. Diagnostic mammogram and US showed the 1.2cm mass at the 6 o'clock position in the right breast. Biopsy showed IDC, grade 3, HER-2 positive (3+), ER 15% weak, PR-, Ki67 80%.   05/11/2020 Cancer Staging   Staging form: Breast, AJCC 8th Edition - Clinical stage from 05/11/2020: Stage IA (cT1b, cN0, cM0, G3, ER+, PR-, HER2+) - Signed by Nicholas Lose, MD on 05/11/2020 Stage prefix: Initial diagnosis   05/19/2020 Genetic Testing   Negative hereditary cancer genetic testing: no pathogenic variants detected in [facility] Breast STAT Panel and Common Hereditary Cancers Panel.  The report dates are May 19, 2020 (STAT) and May 23, 2020 (Common Hereditary).   The Common Hereditary Cancers Panel offered by [facility] includes sequencing and/or deletion duplication testing of the following 47 genes: APC, ATM, AXIN2, BARD1, BMPR1A, BRCA1, BRCA2, BRIP1, CDH1, CDK4, CDKN2A (p14ARF), CDKN2A (p16INK4a), CHEK2, CTNNA1, DICER1, EPCAM (Deletion/duplication testing only), GREM1 (promoter region deletion/duplication testing only), GREM1, HOXB13, KIT, MEN1, MLH1, MSH2,  MSH3, MSH6, MUTYH, NBN, NF1, NHTL1, PALB2, PDGFRA, PMS2, POLD1, POLE, PTEN, RAD50, RAD51C, RAD51D, SDHA, SDHB, SDHC, SDHD, SMAD4, SMARCA4. STK11, TP53, TSC1, TSC2, and VHL.  The following genes were evaluated for sequence changes only: SDHA and HOXB13 c.251G>A variant only.es only: SDHA and HOXB13 c.251G>A variant only.   06/02/2020 Surgery   Right lumpectomy ([facility]): invasive and in situ ductal carcinoma, 1.3cm, clear margins, 1 right axillary lymph node negative for carcinoma.     CHIEF COMPLIANT: Follow-up s/p right lumpectomy   INTERVAL HISTORY: Pamela [name] is a 55 y.o. with above-mentioned history of right breast cancer. She underwent a right lumpectomy on 06/02/20 with Dr. Brantley Stage for which pathology showed invasive and in situ ductal carcinoma, 1.3cm, clear margins, 1 right axillary lymph node negative for carcinoma. She presents to the clinic today for follow-up.   ALLERGIES:  is allergic to pollen extract, latex, codeine, shellfish allergy, and betadine [povidone iodine].  MEDICATIONS:  Current Outpatient Medications  Medication Sig Dispense Refill  . albuterol (VENTOLIN HFA) 108 (90 Base) MCG/ACT inhaler Inhale 2 puffs into the lungs every 6 (six) hours as needed for wheezing or shortness of breath. (Patient not taking: Reported on 05/11/2020) 1 Inhaler 2  . amLODipine (NORVASC) 10 MG tablet Take 1 tablet (10 mg total) by mouth daily. 90 tablet 2  . aspirin EC 81 MG tablet Take 1 tablet (81 mg total) by mouth daily. Swallow whole. 30 tablet 11  . Azilsartan-Chlorthalidone (EDARBYCLOR) 40-25 MG TABS Take 1 tablet by mouth daily. 30 tablet 0  . busPIRone (BUSPAR) 7.5 MG tablet Take 1 tablet (7.5 mg total) by mouth 2 (two) times daily. (Patient not taking: No sig reported) 60 tablet 2  . chlorhexidine (PERIDEX)  0.12 % solution Use as directed 10 mLs in the mouth or throat 2 (two) times daily. (Patient not taking: Reported on 05/11/2020) 240 mL 0  . dextromethorphan-guaiFENesin  (MUCINEX DM) 30-600 MG 12hr tablet Take 1 tablet by mouth 2 (two) times daily. (Patient not taking: Reported on 05/11/2020) 20 tablet 0  . hydrOXYzine (ATARAX/VISTARIL) 25 MG tablet Take 1 tablet (25 mg total) by mouth every 6 (six) hours as needed for anxiety. 36 tablet 0  . ibuprofen (ADVIL) 800 MG tablet Take 1 tablet (800 mg total) by mouth 3 (three) times daily. (Patient not taking: Reported on 05/11/2020) 21 tablet 0  . ibuprofen (ADVIL) 800 MG tablet Take 1 tablet (800 mg total) by mouth every 8 (eight) hours as needed. 30 tablet 0  . oxyCODONE (OXY IR/ROXICODONE) 5 MG immediate release tablet Take 1 tablet (5 mg total) by mouth every 6 (six) hours as needed for severe pain. 15 tablet 0  . rosuvastatin (CRESTOR) 20 MG tablet TAKE 1 TABLET IN THE P.M. 90 tablet 2  . Vitamin D, Ergocalciferol, (DRISDOL) 1.25 MG (50000 UNIT) CAPS capsule Take 1 capsule (50,000 Units total) by mouth every 7 (seven) days. 24 capsule 1   No current facility-administered medications for this visit.    PHYSICAL EXAMINATION: ECOG PERFORMANCE STATUS: 1 - Symptomatic but completely ambulatory  Vitals:   06/09/20 1532  BP: 128/65  Pulse: 70  Resp: 18  Temp: (!) 97.3 F (36.3 C)  SpO2: 98%   Filed Weights   06/09/20 1532  Weight: (!) 332 lb 12.8 oz (151 kg)    LABORATORY DATA:  I have reviewed the data as listed CMP Latest Ref Rng & Units 05/11/2020 02/22/2020 09/22/2019  Glucose 70 - 99 mg/dL 112(H) 119(H) 95  BUN 6 - 20 mg/dL 9 9 11   Creatinine 0.44 - 1.00 mg/dL 0.97 0.89 0.97  Sodium 135 - 145 mmol/L 143 139 142  Potassium 3.5 - 5.1 mmol/L 3.9 4.0 4.3  Chloride 98 - 111 mmol/L 102 101 98  CO2 22 - 32 mmol/L 30 27 27   Calcium 8.9 - 10.3 mg/dL 9.9 9.8 10.2  Total Protein 6.5 - 8.1 g/dL 7.8 - -  Total Bilirubin 0.3 - 1.2 mg/dL 0.8 - -  Alkaline Phos 38 - 126 U/L 56 - -  AST 15 - 41 U/L 19 - -  ALT 0 - 44 U/L 20 - -    Lab Results  Component Value Date   WBC 7.1 05/11/2020   HGB 11.7 (L) 05/11/2020    HCT 37.2 05/11/2020   MCV 83.8 05/11/2020   PLT 238 05/11/2020   NEUTROABS 4.3 05/11/2020    ASSESSMENT & PLAN:  Malignant neoplasm of lower-outer quadrant of right breast of female, estrogen receptor positive ([place]) 05/03/2020:Screening mammogram showed a 1.0cm mass at the 6 o'clock position in the right breast. Diagnostic mammogram and US showed the 1.2cm mass at the 6 o'clock position in the right breast. Biopsy showed IDC, grade 3, HER-2 positive (3+), ER 15% weak, PR-, Ki67 80%.  06/02/2020:Right lumpectomy ([facility]): invasive and in situ ductal carcinoma, 1.3cm, clear margins, 1 right axillary lymph node negative for carcinoma. ER 15% weak, PR-,HER-2 positive (3+) Ki67 80%.  Pathology counseling: I discussed the final pathology report of the patient provided  a copy of this report. I discussed the margins as well as lymph node surgeries. We also discussed the final staging along with previously performed ER/PR and HER-2/neu testing.  Treatment plan: 1.  Adjuvant chemotherapy with  Taxol Herceptin followed by Herceptin maintenance. 2.  Adjuvant radiation therapy 3.  Adjuvant antiestrogen therapy ----------------------------------------------------------------------------------------------------- Return to clinic in 3 weeks to start chemotherapy. Chemo education and echocardiogram have been ordered.   No orders of the defined types were placed in this encounter.  The patient has a good understanding of the overall plan. she agrees with it. she will call with any problems that may develop before the next visit here.  Total time spent: 30 mins including face to face time and time spent for planning, charting and coordination of care  Rulon Eisenmenger, MD, MPH 06/09/2020  I, Molly Dorshimer, am acting as scribe for Dr. Nicholas Lose.  I have reviewed the above documentation for accuracy and completeness, and I agree with the above.

## 2020-06-09 ENCOUNTER — Encounter: Payer: Self-pay | Admitting: *Deleted

## 2020-06-09 ENCOUNTER — Other Ambulatory Visit: Payer: Self-pay

## 2020-06-09 ENCOUNTER — Inpatient Hospital Stay: Payer: 59 | Attending: Hematology and Oncology | Admitting: Hematology and Oncology

## 2020-06-09 VITALS — BP 128/65 | HR 70 | Temp 97.3°F | Resp 18 | Ht 69.0 in | Wt 332.8 lb

## 2020-06-09 DIAGNOSIS — Z170 Estrogen receptor positive status [ER+]: Secondary | ICD-10-CM | POA: Insufficient documentation

## 2020-06-09 DIAGNOSIS — Z79899 Other long term (current) drug therapy: Secondary | ICD-10-CM | POA: Diagnosis not present

## 2020-06-09 DIAGNOSIS — C50511 Malignant neoplasm of lower-outer quadrant of right female breast: Secondary | ICD-10-CM | POA: Insufficient documentation

## 2020-06-09 MED ORDER — ONDANSETRON HCL 8 MG PO TABS: 8.0000 mg | ORAL_TABLET | Freq: Two times a day (BID) | ORAL | 1 refills | Status: DC | PRN

## 2020-06-09 MED ORDER — PROCHLORPERAZINE MALEATE 10 MG PO TABS: 10.0000 mg | ORAL_TABLET | Freq: Four times a day (QID) | ORAL | 1 refills | Status: DC | PRN

## 2020-06-09 MED ORDER — LIDOCAINE-PRILOCAINE 2.5-2.5 % EX CREA: TOPICAL_CREAM | CUTANEOUS | 3 refills | Status: DC

## 2020-06-09 NOTE — Assessment & Plan Note (Signed)
05/03/2020:Screening mammogram showed a 1.0cm mass at the 6 o'clock position in the right breast. Diagnostic mammogram and US showed the 1.2cm mass at the 6 o'clock position in the right breast. Biopsy showed IDC, grade 3, HER-2 positive (3+), ER 15% weak, PR-, Ki67 80%.  06/02/2020:Right lumpectomy ([facility]): invasive and in situ ductal carcinoma, 1.3cm, clear margins, 1 right axillary lymph node negative for carcinoma. ER 15% weak, PR-,HER-2 positive (3+) Ki67 80%.  Pathology counseling: I discussed the final pathology report of the patient provided  a copy of this report. I discussed the margins as well as lymph node surgeries. We also discussed the final staging along with previously performed ER/PR and HER-2/neu testing.  Treatment plan: 1.  Adjuvant chemotherapy with Taxol Herceptin followed by Herceptin maintenance. 2.  Adjuvant radiation therapy 3.  Adjuvant antiestrogen therapy ----------------------------------------------------------------------------------------------------- Return to clinic in 3 weeks to start chemotherapy.

## 2020-06-09 NOTE — Progress Notes (Signed)
START ON PATHWAY REGIMEN - Breast ° ° °  Cycle 1: A cycle is 7 days: °    Trastuzumab-xxxx  °    Paclitaxel  °  Cycles 2 through 12: A cycle is every 7 days: °    Trastuzumab-xxxx  °    Paclitaxel  °  Cycles 13 through 25: A cycle is every 21 days: °    Trastuzumab-xxxx  ° °**Always confirm dose/schedule in your pharmacy ordering system** ° °Patient Characteristics: °Postoperative without Neoadjuvant Therapy (Pathologic Staging), Invasive Disease, Adjuvant Therapy, HER2 Positive, ER Positive, Node Negative, pT1c, pN0/N1mi °Therapeutic Status: Postoperative without Neoadjuvant Therapy (Pathologic Staging) °AJCC Grade: G3 °AJCC N Category: pN0 °AJCC M Category: cM0 °ER Status: Positive (+) °AJCC 8 Stage Grouping: IA °HER2 Status: Positive (+) °Oncotype Dx Recurrence Score: Not Appropriate °AJCC T Category: pT1c °PR Status: Negative (-) °Adjuvant Therapy Status: No Adjuvant Therapy Received Yet or Changing Initial Adjuvant Regimen due to Tolerance °Intent of Therapy: °Curative Intent, Discussed with Patient °

## 2020-06-13 ENCOUNTER — Other Ambulatory Visit: Payer: Self-pay

## 2020-06-13 ENCOUNTER — Inpatient Hospital Stay: Payer: 59

## 2020-06-13 ENCOUNTER — Encounter: Payer: Self-pay | Admitting: *Deleted

## 2020-06-14 ENCOUNTER — Telehealth: Payer: Self-pay | Admitting: Hematology and Oncology

## 2020-06-14 NOTE — Telephone Encounter (Signed)
Scheduled appt per 4/11 sch msg. Pt aware.  

## 2020-06-21 ENCOUNTER — Ambulatory Visit (INDEPENDENT_AMBULATORY_CARE_PROVIDER_SITE_OTHER): Payer: 59 | Admitting: Internal Medicine

## 2020-06-21 ENCOUNTER — Encounter: Payer: Self-pay | Admitting: Internal Medicine

## 2020-06-21 ENCOUNTER — Other Ambulatory Visit: Payer: Self-pay

## 2020-06-21 VITALS — BP 116/78 | HR 74 | Temp 98.4°F | Ht 69.0 in | Wt 339.0 lb

## 2020-06-21 DIAGNOSIS — I10 Essential (primary) hypertension: Secondary | ICD-10-CM | POA: Diagnosis not present

## 2020-06-21 DIAGNOSIS — Z6841 Body Mass Index (BMI) 40.0 and over, adult: Secondary | ICD-10-CM

## 2020-06-21 DIAGNOSIS — Z0000 Encounter for general adult medical examination without abnormal findings: Secondary | ICD-10-CM | POA: Diagnosis not present

## 2020-06-21 DIAGNOSIS — C50511 Malignant neoplasm of lower-outer quadrant of right female breast: Secondary | ICD-10-CM | POA: Diagnosis not present

## 2020-06-21 DIAGNOSIS — H6121 Impacted cerumen, right ear: Secondary | ICD-10-CM

## 2020-06-21 DIAGNOSIS — Z170 Estrogen receptor positive status [ER+]: Secondary | ICD-10-CM

## 2020-06-21 LAB — POCT URINALYSIS DIPSTICK
Bilirubin, UA: NEGATIVE
Glucose, UA: NEGATIVE
Ketones, UA: NEGATIVE
Leukocytes, UA: NEGATIVE
Nitrite, UA: NEGATIVE
Protein, UA: NEGATIVE
Spec Grav, UA: 1.025 (ref 1.010–1.025)
Urobilinogen, UA: 1 E.U./dL
pH, UA: 5.5 (ref 5.0–8.0)

## 2020-06-21 LAB — POCT UA - MICROALBUMIN
Albumin/Creatinine Ratio, Urine, POC: <30
Creatinine, POC: 300 mg/dL
Microalbumin Ur, POC: 30 mg/L

## 2020-06-21 MED ORDER — EDARBYCLOR 40-25 MG PO TABS: 1.0000 | ORAL_TABLET | Freq: Every day | ORAL | 2 refills | Status: DC

## 2020-06-21 NOTE — Progress Notes (Signed)
I,Pamela [name],acting as a Education administrator for Pamela Greenland, MD.,have documented all relevant documentation on the behalf of Pamela Greenland, MD,as directed by  Pamela Greenland, MD while in the presence of Pamela Greenland, MD.  This visit occurred during the SARS-CoV-2 public health emergency.  Safety protocols were in place, including screening questions prior to the visit, additional usage of staff PPE, and extensive cleaning of exam room while observing appropriate contact time as indicated for disinfecting solutions.  Subjective:     Patient ID: Pamela [name] , female    DOB: [birth year] , 55 y.o.   MRN: [id number]   Chief Complaint  Patient presents with  . Annual Exam  . Hypertension    HPI  She is here today for a full physical exam. She is followed by [facility]  for her pelvic exams. Last pap was 02/02/2020. Since her last visit, she has been diagnosed with right breast cancer. She has had lumpectomy and lymph node resection. Scheduled for chemo, radiation and then adjuvant therapy.   Hypertension This is a chronic problem. The current episode started more than 1 year ago. The problem has been gradually improving since onset. The problem is controlled. Pertinent negatives include no blurred vision, chest pain, palpitations or shortness of breath. Risk factors for coronary artery disease include obesity and sedentary lifestyle.     Past Medical History:  Diagnosis Date  . Asthma   . DUB (dysfunctional uterine bleeding) 2007  . Elevated cholesterol   . Family history of breast cancer 05/12/2020  . Family history of lung cancer 05/12/2020  . H/O menorrhagia 05/01/2006  . History of ovarian cyst 2007  . Hypertension 03/31/2005  . Increased BMI 07/11/2006  . Migraine   . N&V (nausea and vomiting)   . Obesity 2007  . Obstructive sleep apnea syndrome, moderate 10/27/2013   no cpap  . Weight loss 07/11/2006     Family History  Problem Relation Age of Onset  .  Diabetes Maternal Grandmother   . Hypertension Mother   . Hypertension Father   . Heart attack Father   . Bone cancer Maternal Grandfather        dx after 93  . Breast cancer Maternal Aunt        dx 30s  . Lung cancer Maternal Aunt        dx after 50     Current Outpatient Medications:  .  aspirin EC 81 MG tablet, Take 1 tablet (81 mg total) by mouth daily. Swallow whole., Disp: 30 tablet, Rfl: 11 .  hydrOXYzine (ATARAX/VISTARIL) 25 MG tablet, Take 1 tablet (25 mg total) by mouth every 6 (six) hours as needed for anxiety., Disp: 36 tablet, Rfl: 0 .  Vitamin D, Ergocalciferol, (DRISDOL) 1.25 MG (50000 UNIT) CAPS capsule, Take 1 capsule (50,000 Units total) by mouth every 7 (seven) days., Disp: 24 capsule, Rfl: 1 .  amLODipine (NORVASC) 10 MG tablet, TAKE 1 TABLET BY MOUTH DAILY., Disp: 90 tablet, Rfl: 2 .  Azilsartan-Chlorthalidone (EDARBYCLOR) 40-25 MG TABS, Take 1 tablet by mouth daily., Disp: 90 tablet, Rfl: 2 .  ondansetron (ZOFRAN) 8 MG tablet, Take 1 tablet (8 mg total) by mouth 2 (two) times daily as needed (Nausea or vomiting). (Patient not taking: Reported on 06/21/2020), Disp: 30 tablet, Rfl: 1 .  oxyCODONE (OXY IR/ROXICODONE) 5 MG immediate release tablet, Take 1 tablet (5 mg total) by mouth every 6 (six) hours as needed for severe pain. (Patient not taking: Reported  on 06/21/2020), Disp: 15 tablet, Rfl: 0 .  prochlorperazine (COMPAZINE) 10 MG tablet, Take 1 tablet (10 mg total) by mouth every 6 (six) hours as needed (Nausea or vomiting). (Patient not taking: Reported on 06/21/2020), Disp: 30 tablet, Rfl: 1 .  rosuvastatin (CRESTOR) 40 MG tablet, Take 1 tablet (40 mg total) by mouth daily., Disp: 90 tablet, Rfl: 3   Allergies  Allergen Reactions  . Pollen Extract Shortness Of Breath  . Latex Rash  . Codeine Hives  . Shellfish Allergy Hives  . Betadine [Povidone Iodine] Rash      The patient states she uses IUD for birth control. Last LMP was No LMP recorded (lmp unknown).  (Menstrual status: IUD).. Negative for Dysmenorrhea. Negative for: breast discharge, breast lump(s), breast pain and breast self exam. Associated symptoms include abnormal vaginal bleeding. Pertinent negatives include abnormal bleeding (hematology), anxiety, decreased libido, depression, difficulty falling sleep, dyspareunia, history of infertility, nocturia, sexual dysfunction, sleep disturbances, urinary incontinence, urinary urgency, vaginal discharge and vaginal itching. Diet regular.The patient states her exercise level is  intermittent.  . The patient's tobacco use is:  Social History   Tobacco Use  Smoking Status Never Smoker  Smokeless Tobacco Never Used  . She has been exposed to passive smoke. The patient's alcohol use is:  Social History   Substance and Sexual Activity  Alcohol Use No    Review of Systems  Constitutional: Negative.   HENT: Negative.   Eyes: Negative.  Negative for blurred vision.  Respiratory: Negative.  Negative for shortness of breath.   Cardiovascular: Negative.  Negative for chest pain and palpitations.  Gastrointestinal: Negative.   Endocrine: Negative.   Genitourinary: Negative.   Musculoskeletal: Negative.   Skin: Negative.   Allergic/Immunologic: Negative.   Neurological: Negative.   Hematological: Negative.   Psychiatric/Behavioral: Negative.      Today's Vitals   06/21/20 0902  BP: 116/78  Pulse: 74  Temp: 98.4 F (36.9 C)  TempSrc: Oral  Weight: (!) 339 lb (153.8 kg)  Height: 5\' 9"  (1.753 m)   Body mass index is 50.06 kg/m.  Wt Readings from Last 3 Encounters:  06/21/20 (!) 339 lb (153.8 kg)  06/09/20 (!) 332 lb 12.8 oz (151 kg)  06/02/20 (!) 340 lb 2.7 oz (154.3 kg)   Objective:  Physical Exam Vitals and nursing note reviewed.  Constitutional:      Appearance: Normal appearance. She is obese.  HENT:     Head: Normocephalic and atraumatic.     Right Ear: Ear canal and external ear normal. There is impacted cerumen.      Left Ear: Tympanic membrane, ear canal and external ear normal.     Nose:     Comments: Masked     Mouth/Throat:     Comments: Masked  Eyes:     Extraocular Movements: Extraocular movements intact.     Conjunctiva/sclera: Conjunctivae normal.     Pupils: Pupils are equal, round, and reactive to light.  Cardiovascular:     Rate and Rhythm: Normal rate and regular rhythm.     Pulses: Normal pulses.     Heart sounds: Normal heart sounds.  Pulmonary:     Effort: Pulmonary effort is normal.     Breath sounds: Normal breath sounds.  Chest:  Breasts:     Tanner Score is 5.     Right: Normal.     Left: Normal.      Comments: Old healed surgical scars b/l; New healing surgical scars r axilla and  underneath r breast. No drainage. No surrounding erythema.  Abdominal:     General: Bowel sounds are normal.     Palpations: Abdomen is soft.     Comments: Obese, soft  Genitourinary:    Comments: deferred Musculoskeletal:        General: Normal range of motion.     Cervical back: Normal range of motion and neck supple.  Skin:    General: Skin is warm and dry.  Neurological:     General: No focal deficit present.     Mental Status: She is alert and oriented to person, place, and time.  Psychiatric:        Mood and Affect: Mood normal.        Behavior: Behavior normal.         Assessment And Plan:     1. Routine general medical examination at health care facility Comments: A full exam was performed. Importance of monthly self breast exams was discussed with the patient. PATIENT IS ADVISED TO GET 30-45 MINUTES REGULAR EXERCISE NO LESS THAN FOUR TO FIVE DAYS PER WEEK - BOTH WEIGHTBEARING EXERCISES AND AEROBIC ARE RECOMMENDED.  PATIENT IS ADVISED TO FOLLOW A HEALTHY DIET WITH AT LEAST SIX FRUITS/VEGGIES PER DAY, DECREASE INTAKE OF RED MEAT, AND TO INCREASE FISH INTAKE TO TWO DAYS PER WEEK.  MEATS/FISH SHOULD NOT BE FRIED, BAKED OR BROILED IS PREFERABLE.  IT IS ALSO IMPORTANT TO CUT BACK ON  YOUR SUGAR INTAKE. PLEASE AVOID ANYTHING WITH ADDED SUGAR, CORN SYRUP OR OTHER SWEETENERS. IF YOU MUST USE A SWEETENER, YOU CAN TRY STEVIA. IT IS ALSO IMPORTANT TO AVOID ARTIFICIALLY SWEETENERS AND DIET BEVERAGES. LASTLY, I SUGGEST WEARING SPF 50 SUNSCREEN ON EXPOSED PARTS AND ESPECIALLY WHEN IN THE DIRECT SUNLIGHT FOR AN EXTENDED PERIOD OF TIME.  PLEASE AVOID FAST FOOD RESTAURANTS AND INCREASE YOUR WATER INTAKE.  - Hemoglobin A1c - Lipid panel - Insulin, random(561) - Vitamin B12  2. Essential hypertension Comments: Chronic, well controlled. She will c/w Edarbiclor and amlodipine. EKG NSR w/ incomplete LBBB - this is a new finding. Advised to follow a low sodium diet.  - POCT Urinalysis Dipstick (81002) - POCT UA - Microalbumin - EKG 12-Lead - Azilsartan-Chlorthalidone (EDARBYCLOR) 40-25 MG TABS; Take 1 tablet by mouth daily.  Dispense: 90 tablet; Refill: 2  3. Right ear impacted cerumen  AFTER OBTAINING VERBAL CONSENT, RIGHT EAR WAS FLUSHED BY IRRIGATION. SHE TOLERATED PROCEDURE WELL WITHOUT ANY COMPLICATIONS. NO TM ABNORMALITIES WERE NOTED.  - Ear Lavage  4. Malignant neoplasm of lower-outer quadrant of right breast of female, estrogen receptor positive ([place]) Comments: Right lumpectomy ([facility]): invasive and in situ ductal carcinoma, 1.3cm, clear margins, 1 right axillary lymph node negative for carcinoma.  5. Class 3 severe obesity due to excess calories with serious comorbidity and body mass index (BMI) of 50.0 to 59.9 in adult [facility]) Comments: BMI 50.  She will be starting chemo in the near future. Will address this once she has completed her full treatment course.   Patient was given opportunity to ask questions. Patient verbalized understanding of the plan and was able to repeat key elements of the plan. All questions were answered to their satisfaction.   I, Pamela Greenland, MD, have reviewed all documentation for this visit. The documentation on 06/21/20 for the exam,  diagnosis, procedures, and orders are all accurate and complete.  THE PATIENT IS ENCOURAGED TO PRACTICE SOCIAL DISTANCING DUE TO THE COVID-19 PANDEMIC.

## 2020-06-21 NOTE — Patient Instructions (Signed)
Health Maintenance, Female Adopting a healthy lifestyle and getting preventive care are important in promoting health and wellness. Ask your health care provider about:  The right schedule for you to have regular tests and exams.  Things you can do on your own to prevent diseases and keep yourself healthy. What should I know about diet, weight, and exercise? Eat a healthy diet  Eat a diet that includes plenty of vegetables, fruits, low-fat dairy products, and lean protein.  Do not eat a lot of foods that are high in solid fats, added sugars, or sodium.   Maintain a healthy weight Body mass index (BMI) is used to identify weight problems. It estimates body fat based on height and weight. Your health care provider can help determine your BMI and help you achieve or maintain a healthy weight. Get regular exercise Get regular exercise. This is one of the most important things you can do for your health. Most adults should:  Exercise for at least 150 minutes each week. The exercise should increase your heart rate and make you sweat (moderate-intensity exercise).  Do strengthening exercises at least twice a week. This is in addition to the moderate-intensity exercise.  Spend less time sitting. Even light physical activity can be beneficial. Watch cholesterol and blood lipids Have your blood tested for lipids and cholesterol at 54 years of age, then have this test every 5 years. Have your cholesterol levels checked more often if:  Your lipid or cholesterol levels are high.  You are older than 54 years of age.  You are at high risk for heart disease. What should I know about cancer screening? Depending on your health history and family history, you may need to have cancer screening at various ages. This may include screening for:  Breast cancer.  Cervical cancer.  Colorectal cancer.  Skin cancer.  Lung cancer. What should I know about heart disease, diabetes, and high blood  pressure? Blood pressure and heart disease  High blood pressure causes heart disease and increases the risk of stroke. This is more likely to develop in people who have high blood pressure readings, are of African descent, or are overweight.  Have your blood pressure checked: ? Every 3-5 years if you are 18-39 years of age. ? Every year if you are 40 years old or older. Diabetes Have regular diabetes screenings. This checks your fasting blood sugar level. Have the screening done:  Once every three years after age 40 if you are at a normal weight and have a low risk for diabetes.  More often and at a younger age if you are overweight or have a high risk for diabetes. What should I know about preventing infection? Hepatitis B If you have a higher risk for hepatitis B, you should be screened for this virus. Talk with your health care provider to find out if you are at risk for hepatitis B infection. Hepatitis C Testing is recommended for:  Everyone born from 1945 through 1965.  Anyone with known risk factors for hepatitis C. Sexually transmitted infections (STIs)  Get screened for STIs, including gonorrhea and chlamydia, if: ? You are sexually active and are younger than 54 years of age. ? You are older than 54 years of age and your health care provider tells you that you are at risk for this type of infection. ? Your sexual activity has changed since you were last screened, and you are at increased risk for chlamydia or gonorrhea. Ask your health care provider   if you are at risk.  Ask your health care provider about whether you are at high risk for HIV. Your health care provider may recommend a prescription medicine to help prevent HIV infection. If you choose to take medicine to prevent HIV, you should first get tested for HIV. You should then be tested every 3 months for as long as you are taking the medicine. Pregnancy  If you are about to stop having your period (premenopausal) and  you may become pregnant, seek counseling before you get pregnant.  Take 400 to 800 micrograms (mcg) of folic acid every day if you become pregnant.  Ask for birth control (contraception) if you want to prevent pregnancy. Osteoporosis and menopause Osteoporosis is a disease in which the bones lose minerals and strength with aging. This can result in bone fractures. If you are 65 years old or older, or if you are at risk for osteoporosis and fractures, ask your health care provider if you should:  Be screened for bone loss.  Take a calcium or vitamin D supplement to lower your risk of fractures.  Be given hormone replacement therapy (HRT) to treat symptoms of menopause. Follow these instructions at home: Lifestyle  Do not use any products that contain nicotine or tobacco, such as cigarettes, e-cigarettes, and chewing tobacco. If you need help quitting, ask your health care provider.  Do not use street drugs.  Do not share needles.  Ask your health care provider for help if you need support or information about quitting drugs. Alcohol use  Do not drink alcohol if: ? Your health care provider tells you not to drink. ? You are pregnant, may be pregnant, or are planning to become pregnant.  If you drink alcohol: ? Limit how much you use to 0-1 drink a day. ? Limit intake if you are breastfeeding.  Be aware of how much alcohol is in your drink. In the [place] one drink equals one 12 oz bottle of beer (355 mL), one 5 oz glass of wine (148 mL), or one 1 oz glass of hard liquor (44 mL). General instructions  Schedule regular health, dental, and eye exams.  Stay current with your vaccines.  Tell your health care provider if: ? You often feel depressed. ? You have ever been abused or do not feel safe at home. Summary  Adopting a healthy lifestyle and getting preventive care are important in promoting health and wellness.  Follow your health care provider's instructions about healthy  diet, exercising, and getting tested or screened for diseases.  Follow your health care provider's instructions on monitoring your cholesterol and blood pressure. This information is not intended to replace advice given to you by your health care provider. Make sure you discuss any questions you have with your health care provider. Document Revised: 02/12/2018 Document Reviewed: 02/12/2018 Elsevier Patient Education  2021 Elsevier Inc.  

## 2020-06-22 LAB — VITAMIN B12: Vitamin B-12: 588 pg/mL (ref 232–1245)

## 2020-06-22 LAB — HEMOGLOBIN A1C
Est. average glucose Bld gHb Est-mCnc: 128 mg/dL
Hgb A1c MFr Bld: 6.1 % — ABNORMAL HIGH (ref 4.8–5.6)

## 2020-06-22 LAB — LIPID PANEL
Chol/HDL Ratio: 4.2 ratio (ref 0.0–4.4)
Cholesterol, Total: 198 mg/dL (ref 100–199)
HDL: 47 mg/dL (ref >39)
LDL Chol Calc (NIH): 129 mg/dL — ABNORMAL HIGH (ref 0–99)
Triglycerides: 120 mg/dL (ref 0–149)
VLDL Cholesterol Cal: 22 mg/dL (ref 5–40)

## 2020-06-22 LAB — INSULIN, RANDOM: INSULIN: 56 u[IU]/mL — ABNORMAL HIGH (ref 2.6–24.9)

## 2020-06-24 ENCOUNTER — Encounter: Payer: Self-pay | Admitting: *Deleted

## 2020-06-24 ENCOUNTER — Telehealth: Payer: Self-pay | Admitting: Hematology and Oncology

## 2020-06-24 NOTE — Progress Notes (Signed)
Pharmacist Chemotherapy Monitoring - Initial Assessment    Anticipated start date: 06/24/20   Regimen:  . Are orders appropriate based on the patient's diagnosis, regimen, and cycle? Yes . Does the plan date match the patient's scheduled date? Yes . Is the sequencing of drugs appropriate? Yes . Are the premedications appropriate for the patient's regimen? Yes . Prior Authorization for treatment is: Not Started o If applicable, is the correct biosimilar selected based on the patient's insurance? yes  Organ Function and Labs: Marland Kitchen Are dose adjustments needed based on the patient's renal function, hepatic function, or hematologic function? No . Are appropriate labs ordered prior to the start of patient's treatment? Yes . Other organ system assessment, if indicated: trastuzumab: Echo/ MUGA . The following baseline labs, if indicated, have been ordered: N/A  Dose Assessment: . Are the drug doses appropriate? Yes . Are the following correct: o Drug concentrations Yes o IV fluid compatible with drug Yes o Administration routes Yes o Timing of therapy Yes . If applicable, does the patient have documented access for treatment and/or plans for port-a-cath placement? yes . If applicable, have lifetime cumulative doses been properly documented and assessed? yes Lifetime Dose Tracking  No doses have been documented on this patient for the following tracked chemicals: Doxorubicin, Epirubicin, Idarubicin, Daunorubicin, Mitoxantrone, Bleomycin, Oxaliplatin, Carboplatin, Liposomal Doxorubicin  o   Toxicity Monitoring/Prevention: . The patient has the following take home antiemetics prescribed: Ondansetron and Prochlorperazine . The patient has the following take home medications prescribed: N/A . Medication allergies and previous infusion related reactions, if applicable, have been reviewed and addressed. Yes . The patient's current medication list has been assessed for drug-drug interactions with  their chemotherapy regimen. no significant drug-drug interactions were identified on review.  Order Review: . Are the treatment plan orders signed? Yes . Is the patient scheduled to see a provider prior to their treatment? Yes  I verify that I have reviewed each item in the above checklist and answered each question accordingly.  Pamela [name], [place], 06/24/2020  7:52 AM

## 2020-06-24 NOTE — Progress Notes (Signed)
The following biosimilar Kanjinti (trastuzumab-anns) has been selected for use in this patient.  Henreitta Leber, PharmD 06/24/20 @ 0800

## 2020-06-24 NOTE — Telephone Encounter (Signed)
Called pt about delaying her tx by a week. When I spoke to pt, she wasn't sure if it would be okay to delay the tx and what that would look like from a clinical standpoint. I told the pt since I was just in scheduling I would not be able to make any recommendations as far as her decision on whether to delay tx or not. I transferred pt to the nurse and let the pt know to call me back after she speaks to the nurse.

## 2020-06-25 ENCOUNTER — Other Ambulatory Visit: Payer: Self-pay | Admitting: Internal Medicine

## 2020-06-27 ENCOUNTER — Encounter: Payer: Self-pay | Admitting: *Deleted

## 2020-06-29 ENCOUNTER — Telehealth: Payer: Self-pay

## 2020-06-29 MED ORDER — ROSUVASTATIN CALCIUM 40 MG PO TABS: 40.0000 mg | ORAL_TABLET | Freq: Every day | ORAL | 3 refills | Status: DC

## 2020-06-29 NOTE — Telephone Encounter (Signed)
Spoke with pt regarding cholesterol lab work recently done by her PCP. Explained to pt results and recommendations per Tennis Must. Pamela [name]. We will increase crestor to 40mg  once daily. Pt is scheduled to have an office visit with Dr. Gwenlyn [name] on 09/02/20, at this appointment we will plan to recheck lipid/liver labs. Pt instructed to come to this visit fasting. All questions answered. Pt would like more information on ways to help LDL cholesterol, offered heart healthy diet and information on reducing LDL via diet. Pt verbalizes understanding.

## 2020-06-30 ENCOUNTER — Other Ambulatory Visit: Payer: Self-pay

## 2020-06-30 ENCOUNTER — Ambulatory Visit: Payer: 59 | Attending: Surgery | Admitting: Physical Therapy

## 2020-06-30 ENCOUNTER — Encounter: Payer: Self-pay | Admitting: Physical Therapy

## 2020-06-30 DIAGNOSIS — R293 Abnormal posture: Secondary | ICD-10-CM | POA: Insufficient documentation

## 2020-06-30 DIAGNOSIS — Z483 Aftercare following surgery for neoplasm: Secondary | ICD-10-CM

## 2020-06-30 DIAGNOSIS — C50511 Malignant neoplasm of lower-outer quadrant of right female breast: Secondary | ICD-10-CM | POA: Diagnosis present

## 2020-06-30 DIAGNOSIS — Z170 Estrogen receptor positive status [ER+]: Secondary | ICD-10-CM | POA: Diagnosis present

## 2020-06-30 NOTE — Patient Instructions (Signed)
            [facility]         [address], [place] [address]         [phone]         Annia Friendly, PT, CLT   After Breast Cancer Class It is recommended you attend the ABC class to be educated on lymphedema risk reduction. This class is free of charge and lasts for 1 hour. It is a 1-time class.  You are scheduled for 07/04/2020 at 11:00. We will send you a Webex link.  Scar massage You can begin gentle scar massage to your incisions using coconut oil or vitamin E cream a few minutes each day.  Home exercise Program Continue doing these until you complete radiation   Follow up PT: It is recommended you return every 3 months for the first 3 years following surgery to be assessed on the SOZO machine for an L-Dex score. This helps prevent clinically significant lymphedema in 95% of patients. These follow up screens are 15 minute appointments that you are not billed for. APPOINTMENTS FOR THIS AFTER 08/23/2020 WILL BE LOCATED AT [facility] AT [address] [place] [address]. You are scheduled for June 27th at 9:00.

## 2020-06-30 NOTE — Therapy (Signed)
[place], [place], [address] Phone: [phone]   Fax:  [phone]  Physical Therapy Treatment  Patient Details  Name: Pamela [name] MRN: [id number] Date of Birth: 01/30/1966 Referring Provider (PT): Dr. [name]   Encounter Date: 06/30/2020   PT End of Session - 06/30/20 1148    Visit Number 2    Number of Visits 2    PT Start Time 8527    PT Stop Time 1148    PT Time Calculation (min) 36 min    Activity Tolerance Patient tolerated treatment well    Behavior During Therapy [facility] for tasks assessed/performed           Past Medical History:  Diagnosis Date  . Asthma   . DUB (dysfunctional uterine bleeding) 2007  . Elevated cholesterol   . Family history of breast cancer 05/12/2020  . Family history of lung cancer 05/12/2020  . H/O menorrhagia 05/01/2006  . History of ovarian cyst 2007  . Hypertension 03/31/2005  . Increased BMI 07/11/2006  . Migraine   . N&V (nausea and vomiting)   . Obesity 2007  . Obstructive sleep apnea syndrome, moderate 10/27/2013   no cpap  . Weight loss 07/11/2006    Past Surgical History:  Procedure Laterality Date  . BREAST LUMPECTOMY WITH RADIOACTIVE SEED AND SENTINEL LYMPH NODE BIOPSY Right 06/02/2020   Procedure: RIGHT BREAST LUMPECTOMY WITH RADIOACTIVE SEED AND RIGHT SENTINEL LYMPH NODE BIOPSY;  Surgeon: [name], MD;  Location: [place];  Service: General;  Laterality: Right;  . BREAST REDUCTION SURGERY  05/1991  . CESAREAN SECTION    . HYSTEROSCOPY  05/01/2006  . PORTACATH PLACEMENT Right 06/02/2020   Procedure: INSERTION PORT-A-CATH;  Surgeon: [name], MD;  Location: [place];  Service: General;  Laterality: Right;    There were no vitals filed for this visit.   Subjective Assessment - 06/30/20 1114    Subjective Patient reports she underwent a right lumpectomy and sentinel node biopsy (1 negative node)  on 06/02/2020. She will begin chemotherapy 04/10/2020 followed by radiation.    Pertinent History Patient was diagnosed on 02/01/2021 with right grade III invasive ductal carcinoma breast cancer. She underwent a right lumpectomy and sentinel node biopsy (1 negative node) on 06/02/2020. It is weakly ER positive, PR negative, and HER2 positive with a Ki67 of 80%. Her BMI is 49.6 putting her at high risk for lymphedema.    Patient Stated Goals See if my arm is ok    Currently in Pain? No/denies              [facility] PT Assessment - 06/30/20 0001      Assessment   Medical Diagnosis s/p right lumpectomy and SLNB    Referring Provider (PT) Dr. Marcello Moores [place]    Onset Date/Surgical Date 06/02/20    Hand Dominance Right    Prior Therapy Baselines      Precautions   Precautions Other (comment)    Precaution Comments ative cancer      Restrictions   Weight Bearing Restrictions No      Balance Screen   Has the patient fallen in the past 6 months No    Has the patient had a decrease in activity level because of a fear of falling?  No    Is the patient reluctant to leave their home because of a fear of falling?  No      Home Environment  Living Environment Private residence    Living Arrangements Children   54 y.o. daughter   Available Help at Discharge Family      Prior Function   Level of Independence Independent    Vocation Full time employment    Programme researcher, broadcasting/film/video for [employer] She is walking 45 minutes      Cognition   Overall Cognitive Status Within Functional Limits for tasks assessed      Observation/Other Assessments   Observations Both the right breast and axillary incisions appear to be well healed with no redness or edema noted. There is some thickening of scar tissue present in both locations.      Posture/Postural Control   Posture/Postural Control Postural limitations    Postural Limitations Rounded Shoulders;Forward head      ROM /  Strength   AROM / PROM / Strength AROM      AROM   AROM Assessment Site Shoulder    Right/Left Shoulder Right    Right Shoulder Extension 56 Degrees    Right Shoulder Flexion 155 Degrees    Right Shoulder ABduction 147 Degrees    Right Shoulder Internal Rotation 67 Degrees    Right Shoulder External Rotation 84 Degrees      Strength   Overall Strength Unable to assess;Due to precautions             LYMPHEDEMA/ONCOLOGY QUESTIONNAIRE - 06/30/20 0001      Type   Cancer Type Right breast cancer      Surgeries   Lumpectomy Date 06/02/20    Sentinel Lymph Node Biopsy Date 06/02/20    Number Lymph Nodes Removed 1      Treatment   Active Chemotherapy Treatment No    Past Chemotherapy Treatment No    Active Radiation Treatment No    Past Radiation Treatment No    Current Hormone Treatment No    Past Hormone Therapy No      What other symptoms do you have   Are you Having Heaviness or Tightness No    Are you having Pain No    Are you having pitting edema No    Is it Hard or Difficult finding clothes that fit No    Do you have infections No    Is there Decreased scar mobility No    Stemmer Sign No      Lymphedema Assessments   Lymphedema Assessments Upper extremities      Right Upper Extremity Lymphedema   10 cm Proximal to Olecranon Process 45.5 cm    Olecranon Process 32.4 cm    10 cm Proximal to Ulnar Styloid Process 27.6 cm    Just Proximal to Ulnar Styloid Process 18.5 cm    Across Hand at [employer] 22 cm    At [place] of 2nd Digit 7 cm      Left Upper Extremity Lymphedema   10 cm Proximal to Olecranon Process 45.7 cm    Olecranon Process 30.9 cm    10 cm Proximal to Ulnar Styloid Process 26.3 cm    Just Proximal to Ulnar Styloid Process 18.4 cm    Across Hand at [employer] 19.8 cm    At [place] of 2nd Digit 6.7 cm              Quick Dash - 06/30/20 0001    Open a tight or new jar No difficulty    Do heavy household chores (wash walls, wash  floors) No difficulty    Carry a shopping bag or briefcase No difficulty    Wash your back No difficulty    Use a knife to cut food No difficulty    Recreational activities in which you take some force or impact through your arm, shoulder, or hand (golf, hammering, tennis) No difficulty    During the past week, to what extent has your arm, shoulder or hand problem interfered with your normal social activities with family, friends, neighbors, or groups? Not at all    During the past week, to what extent has your arm, shoulder or hand problem limited your work or other regular daily activities Not at all    Arm, shoulder, or hand pain. Mild    Tingling (pins and needles) in your arm, shoulder, or hand Mild    Difficulty Sleeping No difficulty    DASH Score 4.55 %                          PT Education - 06/30/20 1139    Education Details Aftercare including scar massage and lymphedema risk reduction    Person(s) Educated Patient    Methods Explanation;Demonstration;Handout    Comprehension Returned demonstration;Verbalized understanding               PT Long Term Goals - 06/30/20 1152      PT LONG TERM GOAL #1   Title Patient will demonstrate she has regained full shoulder ROM and function post operatively compared to baselines.    Time 8    Period Weeks    Status Achieved                 Plan - 06/30/20 1148    Clinical Impression Statement Patient appears to be doing very well s/p right lumpectomy and sentinel node biopsy on 06/02/2020. She had 1 negative node removed. Her incisions look well healed, her shoulder ROM is back to baseline, funcitonally she has returned to baseline, and she has no signs of early lymphedema. She will participate in the After Breast Cancer class on 07/04/2020 for lymphedema education. She will f/u with [place] every 3 months for a quick SOZO screen to detect subclinica lympehdema but otherwise has no PT needs at this time. She has been  encouraged to increase her activity level and aim for at least 30 minutes of exercise daily. She knows to contact us with questions or concerns.    PT Treatment/Interventions ADLs/Self Care Home Management;Therapeutic exercise;Patient/family education    PT Next Visit Plan D/C    PT Home Exercise Plan Post op shoulder ROM HEP    Consulted and Agree with Plan of Care Patient           Patient will benefit from skilled therapeutic intervention in order to improve the following deficits and impairments:  Postural dysfunction,Decreased knowledge of precautions,Impaired UE functional use,Pain  Visit Diagnosis: Abnormal posture  Malignant neoplasm of lower-outer quadrant of right breast of female, estrogen receptor positive [facility])  Aftercare following surgery for neoplasm     Problem List Patient Active Problem List   Diagnosis Date Noted  . Genetic testing 05/20/2020  . Family history of breast cancer 05/12/2020  . Family history of lung cancer 05/12/2020  . Malignant neoplasm of lower-outer quadrant of right breast of female, estrogen receptor positive ([place]) 05/03/2020  . Elevated troponin level not due myocardial infarction 09/18/2019  . Mixed hyperlipidemia 09/18/2019  . Hypertensive emergency 09/17/2019  .  Primary osteoarthritis of right hip 08/06/2018  . Seasonal allergies 06/12/2018  . Fibrocystic disease of breast 05/27/2018  . Obstructive sleep apnea syndrome, moderate 10/27/2013  . Shift work sleep disorder 10/27/2013  . Psychic factors associated with diseases classified elsewhere 08/07/2013  . High cholesterol 07/21/2013  . Essential hypertension 07/21/2013  . Snoring 07/21/2013  . IUD contraception-inserted 10/01/11 10/01/2011  . Class 3 severe obesity due to excess calories with serious comorbidity and body mass index (BMI) of 45.0 to 49.9 in adult [facility]) 09/03/2011   PHYSICAL THERAPY DISCHARGE SUMMARY  Visits from Start of Care: 2  Current functional level  related to goals / functional outcomes: Goals met. See above for objective findings.   Remaining deficits: None   Education / Equipment: Lymphedema education and HEP Plan: Patient agrees to discharge.  Patient goals were met. Patient is being discharged due to meeting the stated rehab goals.  ?????         Annia Friendly, [place] 06/30/20 11:53 AM  [place], [place], [address] Phone: [phone]   Fax:  [phone]  Name: RHYLAN GROSS MRN: [id number] Date of Birth: [birth year]

## 2020-07-01 ENCOUNTER — Ambulatory Visit: Payer: 59

## 2020-07-01 ENCOUNTER — Other Ambulatory Visit: Payer: 59

## 2020-07-07 ENCOUNTER — Encounter: Payer: Self-pay | Admitting: Hematology and Oncology

## 2020-07-07 NOTE — Assessment & Plan Note (Addendum)
05/03/2020:Screening mammogram showed a 1.0cm mass at the 6 o'clock position in the right breast. Diagnostic mammogram and US showed the 1.2cm mass at the 6 o'clock position in the right breast. Biopsy showed IDC, grade 3, HER-2 positive (3+), ER 15% weak, PR-, Ki67 80%.  06/02/2020:Right lumpectomy ([facility]): invasive and in situ ductal carcinoma, 1.3cm, clear margins, 1 right axillary lymph node negative for carcinoma. ER 15% weak, PR-,HER-2 positive (3+) Ki67 80%.  Treatment plan: 1.Adjuvant chemotherapy with Taxol Herceptinfollowed byHerceptin maintenance. 2.Adjuvant radiation therapy 3.Adjuvant antiestrogen therapy ----------------------------------------------------------------------------------------------------- Current Treatment: Cycle 1 Taxol Herceptin ECHO 05/20/20: EF 60-65% RTC in 1 week for cycle 2

## 2020-07-07 NOTE — Progress Notes (Signed)
Patient Care Team: [name], MD as PCP - General (Internal Medicine) Mcarthur Rossetti, MD as Consulting Physician (Orthopedic Surgery) Mauro Kaufmann, RN as Oncology Nurse Navigator Rockwell Germany, RN as Oncology Nurse Navigator [name], MD as Consulting Physician (General Surgery) Nicholas Lose, MD as Consulting Physician (Hematology and Oncology) Eppie Gibson, MD as Attending Physician (Radiation Oncology)  DIAGNOSIS:    ICD-10-CM   1. Malignant neoplasm of lower-outer quadrant of right breast of female, estrogen receptor positive ([place])  C50.511    Z17.0     SUMMARY OF ONCOLOGIC HISTORY: Oncology History  Malignant neoplasm of lower-outer quadrant of right breast of female, estrogen receptor positive ([place])  05/03/2020 Initial Diagnosis   Screening mammogram showed a 1.0cm mass at the 6 o'clock position in the right breast. Diagnostic mammogram and US showed the 1.2cm mass at the 6 o'clock position in the right breast. Biopsy showed IDC, grade 3, HER-2 positive (3+), ER 15% weak, PR-, Ki67 80%.   05/11/2020 Cancer Staging   Staging form: Breast, AJCC 8th Edition - Clinical stage from 05/11/2020: Stage IA (cT1b, cN0, cM0, G3, ER+, PR-, HER2+) - Signed by Nicholas Lose, MD on 05/11/2020 Stage prefix: Initial diagnosis   05/19/2020 Genetic Testing   Negative hereditary cancer genetic testing: no pathogenic variants detected in [facility] Breast STAT Panel and Common Hereditary Cancers Panel.  The report dates are May 19, 2020 (STAT) and May 23, 2020 (Common Hereditary).   The Common Hereditary Cancers Panel offered by [facility] includes sequencing and/or deletion duplication testing of the following 47 genes: APC, ATM, AXIN2, BARD1, BMPR1A, BRCA1, BRCA2, BRIP1, CDH1, CDK4, CDKN2A (p14ARF), CDKN2A (p16INK4a), CHEK2, CTNNA1, DICER1, EPCAM (Deletion/duplication testing only), GREM1 (promoter region deletion/duplication testing only), GREM1, HOXB13, KIT, MEN1, MLH1, MSH2,  MSH3, MSH6, MUTYH, NBN, NF1, NHTL1, PALB2, PDGFRA, PMS2, POLD1, POLE, PTEN, RAD50, RAD51C, RAD51D, SDHA, SDHB, SDHC, SDHD, SMAD4, SMARCA4. STK11, TP53, TSC1, TSC2, and VHL.  The following genes were evaluated for sequence changes only: SDHA and HOXB13 c.251G>A variant only.es only: SDHA and HOXB13 c.251G>A variant only.   06/02/2020 Surgery   Right lumpectomy ([facility]): invasive and in situ ductal carcinoma, 1.3cm, clear margins, 1 right axillary lymph node negative for carcinoma.   06/02/2020 Cancer Staging   Staging form: Breast, AJCC 8th Edition - Pathologic stage from 06/02/2020: Stage IA (pT1c, pN0, cM0, G3, ER+, PR-, HER2+) - Signed by Gardenia Phlegm, NP on 06/15/2020 Stage prefix: Initial diagnosis Histologic grading system: 3 grade system   07/08/2020 -  Chemotherapy    Patient is on Treatment Plan: BREAST PACLITAXEL + TRASTUZUMAB Q7D / TRASTUZUMAB Q21D        CHIEF COMPLIANT: Cycle 1 Taxol Herceptin  INTERVAL HISTORY: Pamela [name] is a 56 y.o. with above-mentioned history of right breast cancer who underwent a right lumpectomy and is currently on adjuvant chemotherapy with Taxol Herceptin. She presents to the clinic today for follow-up.   She is ready to start her treatment today.  Denies any new problems or concerns.  She is anxious to get started.  ALLERGIES:  is allergic to pollen extract, latex, codeine, shellfish allergy, and betadine [povidone iodine].  MEDICATIONS:  Current Outpatient Medications  Medication Sig Dispense Refill  . amLODipine (NORVASC) 10 MG tablet TAKE 1 TABLET BY MOUTH DAILY. 90 tablet 2  . aspirin EC 81 MG tablet Take 1 tablet (81 mg total) by mouth daily. Swallow whole. 30 tablet 11  . Azilsartan-Chlorthalidone (EDARBYCLOR) 40-25 MG TABS Take 1 tablet by mouth  daily. 90 tablet 2  . hydrOXYzine (ATARAX/VISTARIL) 25 MG tablet Take 1 tablet (25 mg total) by mouth every 6 (six) hours as needed for anxiety. 36 tablet 0  . rosuvastatin (CRESTOR)  40 MG tablet Take 1 tablet (40 mg total) by mouth daily. 90 tablet 3  . Vitamin D, Ergocalciferol, (DRISDOL) 1.25 MG (50000 UNIT) CAPS capsule Take 1 capsule (50,000 Units total) by mouth every 7 (seven) days. 24 capsule 1   No current facility-administered medications for this visit.    PHYSICAL EXAMINATION: ECOG PERFORMANCE STATUS: 1 - Symptomatic but completely ambulatory  Vitals:   07/08/20 1141  BP: (!) 128/53  Pulse: 89  Resp: 17  Temp: 97.7 F (36.5 C)  SpO2: 100%   Filed Weights   07/08/20 1141  Weight: (!) 338 lb (153.3 kg)    LABORATORY DATA:  I have reviewed the data as listed CMP Latest Ref Rng & Units 05/11/2020 02/22/2020 09/22/2019  Glucose 70 - 99 mg/dL 112(H) 119(H) 95  BUN 6 - 20 mg/dL 9 9 11   Creatinine 0.44 - 1.00 mg/dL 0.97 0.89 0.97  Sodium 135 - 145 mmol/L 143 139 142  Potassium 3.5 - 5.1 mmol/L 3.9 4.0 4.3  Chloride 98 - 111 mmol/L 102 101 98  CO2 22 - 32 mmol/L 30 27 27   Calcium 8.9 - 10.3 mg/dL 9.9 9.8 10.2  Total Protein 6.5 - 8.1 g/dL 7.8 - -  Total Bilirubin 0.3 - 1.2 mg/dL 0.8 - -  Alkaline Phos 38 - 126 U/L 56 - -  AST 15 - 41 U/L 19 - -  ALT 0 - 44 U/L 20 - -    Lab Results  Component Value Date   WBC 7.1 05/11/2020   HGB 11.7 (L) 05/11/2020   HCT 37.2 05/11/2020   MCV 83.8 05/11/2020   PLT 238 05/11/2020   NEUTROABS 4.3 05/11/2020    ASSESSMENT & PLAN:  Malignant neoplasm of lower-outer quadrant of right breast of female, estrogen receptor positive ([place]) 05/03/2020:Screening mammogram showed a 1.0cm mass at the 6 o'clock position in the right breast. Diagnostic mammogram and US showed the 1.2cm mass at the 6 o'clock position in the right breast. Biopsy showed IDC, grade 3, HER-2 positive (3+), ER 15% weak, PR-, Ki67 80%.  06/02/2020:Right lumpectomy ([facility]): invasive and in situ ductal carcinoma, 1.3cm, clear margins, 1 right axillary lymph node negative for carcinoma. ER 15% weak, PR-,HER-2 positive (3+) Ki67 80%.  Treatment  plan: 1.Adjuvant chemotherapy with Taxol Herceptinfollowed byHerceptin maintenance. 2.Adjuvant radiation therapy 3.Adjuvant antiestrogen therapy ----------------------------------------------------------------------------------------------------- Current Treatment: Cycle 1 Taxol Herceptin ECHO 05/20/20: EF 60-65% I discussed with her about antiemetic regimen in detail. We discussed the entire treatment plan once again.  RTC in 1 week for cycle 2    No orders of the defined types were placed in this encounter.  The patient has a good understanding of the overall plan. she agrees with it. she will call with any problems that may develop before the next visit here.  Total time spent: 30 mins including face to face time and time spent for planning, charting and coordination of care  Rulon Eisenmenger, MD, MPH 07/08/2020  I, Cloyde Reams Dorshimer, am acting as scribe for Dr. Nicholas Lose.  I have reviewed the above documentation for accuracy and completeness, and I agree with the above.

## 2020-07-07 NOTE — Progress Notes (Signed)
Called pt to introduce myself as herFinancial Resource Specialist,discuss copay assistanceand the [employer].  Pt gave me consent to apply in herbehalf so I enrolledher in the [employer] First Step program forKanjintifor $20,000 per calendar yearfrom5/5/22.  Pt will pay $0 for herfirstdose or cycleand $68for each subsequent dose or cycle.  I also informed her of the [employer], went over what it covers and gave her the income requirement. Ptis overqualified for the [employer] at this time.Iwillgiveher my card for any questions or concernsshe may have in the future.

## 2020-07-08 ENCOUNTER — Inpatient Hospital Stay: Payer: 59 | Attending: Hematology and Oncology

## 2020-07-08 ENCOUNTER — Encounter: Payer: Self-pay | Admitting: *Deleted

## 2020-07-08 ENCOUNTER — Inpatient Hospital Stay: Payer: 59 | Admitting: Hematology and Oncology

## 2020-07-08 ENCOUNTER — Inpatient Hospital Stay: Payer: 59

## 2020-07-08 ENCOUNTER — Other Ambulatory Visit: Payer: Self-pay

## 2020-07-08 ENCOUNTER — Other Ambulatory Visit: Payer: 59

## 2020-07-08 VITALS — BP 130/66 | HR 65 | Temp 99.2°F | Resp 16

## 2020-07-08 DIAGNOSIS — Z885 Allergy status to narcotic agent status: Secondary | ICD-10-CM | POA: Insufficient documentation

## 2020-07-08 DIAGNOSIS — Z79899 Other long term (current) drug therapy: Secondary | ICD-10-CM | POA: Insufficient documentation

## 2020-07-08 DIAGNOSIS — M791 Myalgia, unspecified site: Secondary | ICD-10-CM | POA: Diagnosis not present

## 2020-07-08 DIAGNOSIS — R112 Nausea with vomiting, unspecified: Secondary | ICD-10-CM | POA: Insufficient documentation

## 2020-07-08 DIAGNOSIS — Z833 Family history of diabetes mellitus: Secondary | ICD-10-CM | POA: Diagnosis not present

## 2020-07-08 DIAGNOSIS — C50511 Malignant neoplasm of lower-outer quadrant of right female breast: Secondary | ICD-10-CM

## 2020-07-08 DIAGNOSIS — Z170 Estrogen receptor positive status [ER+]: Secondary | ICD-10-CM

## 2020-07-08 DIAGNOSIS — Z803 Family history of malignant neoplasm of breast: Secondary | ICD-10-CM | POA: Diagnosis not present

## 2020-07-08 DIAGNOSIS — Z5112 Encounter for antineoplastic immunotherapy: Secondary | ICD-10-CM | POA: Diagnosis present

## 2020-07-08 DIAGNOSIS — Z8249 Family history of ischemic heart disease and other diseases of the circulatory system: Secondary | ICD-10-CM | POA: Insufficient documentation

## 2020-07-08 DIAGNOSIS — Z808 Family history of malignant neoplasm of other organs or systems: Secondary | ICD-10-CM | POA: Diagnosis not present

## 2020-07-08 DIAGNOSIS — I10 Essential (primary) hypertension: Secondary | ICD-10-CM | POA: Diagnosis not present

## 2020-07-08 DIAGNOSIS — Z801 Family history of malignant neoplasm of trachea, bronchus and lung: Secondary | ICD-10-CM | POA: Insufficient documentation

## 2020-07-08 DIAGNOSIS — Z95828 Presence of other vascular implants and grafts: Secondary | ICD-10-CM | POA: Insufficient documentation

## 2020-07-08 LAB — CBC WITH DIFFERENTIAL (CANCER CENTER ONLY)
Abs Immature Granulocytes: 0.05 10*3/uL (ref 0.00–0.07)
Basophils Absolute: 0 10*3/uL (ref 0.0–0.1)
Basophils Relative: 1 %
Eosinophils Absolute: 0.2 10*3/uL (ref 0.0–0.5)
Eosinophils Relative: 3 %
HCT: 37 % (ref 36.0–46.0)
Hemoglobin: 11.6 g/dL — ABNORMAL LOW (ref 12.0–15.0)
Immature Granulocytes: 1 %
Lymphocytes Relative: 30 %
Lymphs Abs: 2 10*3/uL (ref 0.7–4.0)
MCH: 26 pg (ref 26.0–34.0)
MCHC: 31.4 g/dL (ref 30.0–36.0)
MCV: 83 fL (ref 80.0–100.0)
Monocytes Absolute: 0.7 10*3/uL (ref 0.1–1.0)
Monocytes Relative: 11 %
Neutro Abs: 3.8 10*3/uL (ref 1.7–7.7)
Neutrophils Relative %: 54 %
Platelet Count: 232 10*3/uL (ref 150–400)
RBC: 4.46 MIL/uL (ref 3.87–5.11)
RDW: 14.6 % (ref 11.5–15.5)
WBC Count: 6.8 10*3/uL (ref 4.0–10.5)
nRBC: 0 % (ref 0.0–0.2)

## 2020-07-08 LAB — CMP (CANCER CENTER ONLY)
ALT: 26 U/L (ref 0–44)
AST: 25 U/L (ref 15–41)
Albumin: 4.1 g/dL (ref 3.5–5.0)
Alkaline Phosphatase: 56 U/L (ref 38–126)
Anion gap: 11 (ref 5–15)
BUN: 11 mg/dL (ref 6–20)
CO2: 30 mmol/L (ref 22–32)
Calcium: 9.7 mg/dL (ref 8.9–10.3)
Chloride: 101 mmol/L (ref 98–111)
Creatinine: 0.94 mg/dL (ref 0.44–1.00)
GFR, Estimated: >60 mL/min (ref >60)
Glucose, Bld: 124 mg/dL — ABNORMAL HIGH (ref 70–99)
Potassium: 3.6 mmol/L (ref 3.5–5.1)
Sodium: 142 mmol/L (ref 135–145)
Total Bilirubin: 0.9 mg/dL (ref 0.3–1.2)
Total Protein: 7.5 g/dL (ref 6.5–8.1)

## 2020-07-08 MED ORDER — ACETAMINOPHEN 325 MG PO TABS
ORAL_TABLET | ORAL | Status: AC
  Filled 2020-07-08: qty 2

## 2020-07-08 MED ORDER — FAMOTIDINE 20 MG IN NS 100 ML IVPB
20.0000 mg | Freq: Once | INTRAVENOUS | Status: AC
  Administered 2020-07-08: 20 mg via INTRAVENOUS

## 2020-07-08 MED ORDER — ACETAMINOPHEN 325 MG PO TABS
650.0000 mg | ORAL_TABLET | Freq: Once | ORAL | Status: AC
Start: 2020-07-08 — End: 2020-07-08
  Administered 2020-07-08: 650 mg via ORAL

## 2020-07-08 MED ORDER — DIPHENHYDRAMINE HCL 50 MG/ML IJ SOLN
25.0000 mg | Freq: Once | INTRAMUSCULAR | Status: AC
  Administered 2020-07-08: 25 mg via INTRAVENOUS

## 2020-07-08 MED ORDER — SODIUM CHLORIDE 0.9 % IV SOLN
10.0000 mg | Freq: Once | INTRAVENOUS | Status: AC
  Administered 2020-07-08: 10 mg via INTRAVENOUS
  Filled 2020-07-08: qty 10

## 2020-07-08 MED ORDER — SODIUM CHLORIDE 0.9 % IV SOLN
Freq: Once | INTRAVENOUS | Status: AC
  Filled 2020-07-08: qty 250

## 2020-07-08 MED ORDER — SODIUM CHLORIDE 0.9 % IV SOLN
80.0000 mg/m2 | Freq: Once | INTRAVENOUS | Status: AC
  Administered 2020-07-08: 216 mg via INTRAVENOUS
  Filled 2020-07-08: qty 36

## 2020-07-08 MED ORDER — SODIUM CHLORIDE 0.9% FLUSH
10.0000 mL | Freq: Once | INTRAVENOUS | Status: AC
  Administered 2020-07-08: 10 mL
  Filled 2020-07-08: qty 10

## 2020-07-08 MED ORDER — HEPARIN SOD (PORK) LOCK FLUSH 100 UNIT/ML IV SOLN
500.0000 [IU] | Freq: Once | INTRAVENOUS | Status: AC | PRN
  Administered 2020-07-08: 500 [IU]
  Filled 2020-07-08: qty 5

## 2020-07-08 MED ORDER — FAMOTIDINE 20 MG IN NS 100 ML IVPB
INTRAVENOUS | Status: AC
  Filled 2020-07-08: qty 100

## 2020-07-08 MED ORDER — TRASTUZUMAB-ANNS CHEMO 150 MG IV SOLR
600.0000 mg | Freq: Once | INTRAVENOUS | Status: AC
  Administered 2020-07-08: 600 mg via INTRAVENOUS
  Filled 2020-07-08: qty 28.57

## 2020-07-08 MED ORDER — DIPHENHYDRAMINE HCL 50 MG/ML IJ SOLN
INTRAMUSCULAR | Status: AC
  Filled 2020-07-08: qty 1

## 2020-07-08 MED ORDER — SODIUM CHLORIDE 0.9% FLUSH
10.0000 mL | INTRAVENOUS | Status: DC | PRN
  Administered 2020-07-08: 10 mL
  Filled 2020-07-08: qty 10

## 2020-07-08 NOTE — Patient Instructions (Signed)
[facility]  Discharge Instructions: Thank you for choosing [facility] to provide your oncology and hematology care.   If you have a lab appointment with the [facility], please go directly to the [facility] and check in at the registration area.   Wear comfortable clothing and clothing appropriate for easy access to any Portacath or PICC line.   We strive to give you quality time with your provider. You may need to reschedule your appointment if you arrive late (15 or more minutes).  Arriving late affects you and other patients whose appointments are after yours.  Also, if you miss three or more appointments without notifying the office, you may be dismissed from the clinic at the provider's discretion.      For prescription refill requests, have your pharmacy contact our office and allow 72 hours for refills to be completed.    Today you received the following chemotherapy and/or immunotherapy agents: Herceptin & Taxol.   To help prevent nausea and vomiting after your treatment, we encourage you to take your nausea medication as directed.  BELOW ARE SYMPTOMS THAT SHOULD BE REPORTED IMMEDIATELY: . *FEVER GREATER THAN 100.4 F (38 C) OR HIGHER . *CHILLS OR SWEATING . *NAUSEA AND VOMITING THAT IS NOT CONTROLLED WITH YOUR NAUSEA MEDICATION . *UNUSUAL SHORTNESS OF BREATH . *UNUSUAL BRUISING OR BLEEDING . *URINARY PROBLEMS (pain or burning when urinating, or frequent urination) . *BOWEL PROBLEMS (unusual diarrhea, constipation, pain near the anus) . TENDERNESS IN MOUTH AND THROAT WITH OR WITHOUT PRESENCE OF ULCERS (sore throat, sores in mouth, or a toothache) . UNUSUAL RASH, SWELLING OR PAIN  . UNUSUAL VAGINAL DISCHARGE OR ITCHING   Items with * indicate a potential emergency and should be followed up as soon as possible or go to the Emergency Department if any problems should occur.  Please show the CHEMOTHERAPY ALERT CARD or IMMUNOTHERAPY  ALERT CARD at check-in to the Emergency Department and triage nurse.  Should you have questions after your visit or need to cancel or reschedule your appointment, please contact [facility]  Dept: [phone]  and follow the prompts.  Office hours are 8:00 a.m. to 4:30 p.m. Monday - Friday. Please note that voicemails left after 4:00 p.m. may not be returned until the following business day.  We are closed weekends and major holidays. You have access to a nurse at all times for urgent questions. Please call the main number to the clinic Dept: [phone] and follow the prompts.   For any non-urgent questions, you may also contact your provider using MyChart. We now offer e-Visits for anyone 69 and older to request care online for non-urgent symptoms. For details visit [email or web address].   Also download the MyChart app! Go to the app store, search "MyChart", open the app, select [facility], and log in with your MyChart username and password.  Due to Covid, a mask is required upon entering the hospital/clinic. If you do not have a mask, one will be given to you upon arrival. For doctor visits, patients may have 1 support person aged 110 or older with them. For treatment visits, patients cannot have anyone with them due to current Covid guidelines and our immunocompromised population.   Trastuzumab injection for infusion What is this medicine? TRASTUZUMAB (tras TOO zoo mab) is a monoclonal antibody. It is used to treat breast cancer and stomach cancer. This medicine may be used for other purposes; ask your health care provider  or pharmacist if you have questions. COMMON BRAND NAME(S): Herceptin, Galvin Proffer, Trazimera What should I tell my health care provider before I take this medicine? They need to know if you have any of these conditions:  heart disease  heart failure  lung or breathing disease, like asthma  an unusual or  allergic reaction to trastuzumab, benzyl alcohol, or other medications, foods, dyes, or preservatives  pregnant or trying to get pregnant  breast-feeding How should I use this medicine? This drug is given as an infusion into a vein. It is administered in a hospital or clinic by a specially trained health care professional. Talk to your pediatrician regarding the use of this medicine in children. This medicine is not approved for use in children. Overdosage: If you think you have taken too much of this medicine contact a poison control center or emergency room at once. NOTE: This medicine is only for you. Do not share this medicine with others. What if I miss a dose? It is important not to miss a dose. Call your doctor or health care professional if you are unable to keep an appointment. What may interact with this medicine? This medicine may interact with the following medications:  certain types of chemotherapy, such as daunorubicin, doxorubicin, epirubicin, and idarubicin This list may not describe all possible interactions. Give your health care provider a list of all the medicines, herbs, non-prescription drugs, or dietary supplements you use. Also tell them if you smoke, drink alcohol, or use illegal drugs. Some items may interact with your medicine. What should I watch for while using this medicine? Visit your doctor for checks on your progress. Report any side effects. Continue your course of treatment even though you feel ill unless your doctor tells you to stop. Call your doctor or health care professional for advice if you get a fever, chills or sore throat, or other symptoms of a cold or flu. Do not treat yourself. Try to avoid being around people who are sick. You may experience fever, chills and shaking during your first infusion. These effects are usually mild and can be treated with other medicines. Report any side effects during the infusion to your health care professional. Fever  and chills usually do not happen with later infusions. Do not become pregnant while taking this medicine or for 7 months after stopping it. Women should inform their doctor if they wish to become pregnant or think they might be pregnant. Women of child-bearing potential will need to have a negative pregnancy test before starting this medicine. There is a potential for serious side effects to an unborn child. Talk to your health care professional or pharmacist for more information. Do not breast-feed an infant while taking this medicine or for 7 months after stopping it. Women must use effective birth control with this medicine. What side effects may I notice from receiving this medicine? Side effects that you should report to your doctor or health care professional as soon as possible:  allergic reactions like skin rash, itching or hives, swelling of the face, lips, or tongue  chest pain or palpitations  cough  dizziness  feeling faint or lightheaded, falls  fever  general ill feeling or flu-like symptoms  signs of worsening heart failure like breathing problems; swelling in your legs and feet  unusually weak or tired Side effects that usually do not require medical attention (report to your doctor or health care professional if they continue or are bothersome):  bone pain  changes in taste  diarrhea  joint pain  nausea/vomiting  weight loss This list may not describe all possible side effects. Call your doctor for medical advice about side effects. You may report side effects to FDA at 1-800-FDA-1088. Where should I keep my medicine? This drug is given in a hospital or clinic and will not be stored at home. NOTE: This sheet is a summary. It may not cover all possible information. If you have questions about this medicine, talk to your doctor, pharmacist, or health care provider.  2021 Elsevier/Gold Standard (2016-02-14 14:37:52)  Paclitaxel injection What is this  medicine? PACLITAXEL (PAK li TAX el) is a chemotherapy drug. It targets fast dividing cells, like cancer cells, and causes these cells to die. This medicine is used to treat ovarian cancer, breast cancer, lung cancer, Kaposi's sarcoma, and other cancers. This medicine may be used for other purposes; ask your health care provider or pharmacist if you have questions. COMMON BRAND NAME(S): Onxol, Taxol What should I tell my health care provider before I take this medicine? They need to know if you have any of these conditions:  history of irregular heartbeat  liver disease  low blood counts, like low white cell, platelet, or red cell counts  lung or breathing disease, like asthma  tingling of the fingers or toes, or other nerve disorder  an unusual or allergic reaction to paclitaxel, alcohol, polyoxyethylated castor oil, other chemotherapy, other medicines, foods, dyes, or preservatives  pregnant or trying to get pregnant  breast-feeding How should I use this medicine? This drug is given as an infusion into a vein. It is administered in a hospital or clinic by a specially trained health care professional. Talk to your pediatrician regarding the use of this medicine in children. Special care may be needed. Overdosage: If you think you have taken too much of this medicine contact a poison control center or emergency room at once. NOTE: This medicine is only for you. Do not share this medicine with others. What if I miss a dose? It is important not to miss your dose. Call your doctor or health care professional if you are unable to keep an appointment. What may interact with this medicine? Do not take this medicine with any of the following medications:  live virus vaccines This medicine may also interact with the following medications:  antiviral medicines for hepatitis, HIV or AIDS  certain antibiotics like erythromycin and clarithromycin  certain medicines for fungal infections  like ketoconazole and itraconazole  certain medicines for seizures like carbamazepine, phenobarbital, phenytoin  gemfibrozil  nefazodone  rifampin  St. John's wort This list may not describe all possible interactions. Give your health care provider a list of all the medicines, herbs, non-prescription drugs, or dietary supplements you use. Also tell them if you smoke, drink alcohol, or use illegal drugs. Some items may interact with your medicine. What should I watch for while using this medicine? Your condition will be monitored carefully while you are receiving this medicine. You will need important blood work done while you are taking this medicine. This medicine can cause serious allergic reactions. To reduce your risk you will need to take other medicine(s) before treatment with this medicine. If you experience allergic reactions like skin rash, itching or hives, swelling of the face, lips, or tongue, tell your doctor or health care professional right away. In some cases, you may be given additional medicines to help with side effects. Follow all directions for their use. This  drug may make you feel generally unwell. This is not uncommon, as chemotherapy can affect healthy cells as well as cancer cells. Report any side effects. Continue your course of treatment even though you feel ill unless your doctor tells you to stop. Call your doctor or health care professional for advice if you get a fever, chills or sore throat, or other symptoms of a cold or flu. Do not treat yourself. This drug decreases your body's ability to fight infections. Try to avoid being around people who are sick. This medicine may increase your risk to bruise or bleed. Call your doctor or health care professional if you notice any unusual bleeding. Be careful brushing and flossing your teeth or using a toothpick because you may get an infection or bleed more easily. If you have any dental work done, tell your dentist you  are receiving this medicine. Avoid taking products that contain aspirin, acetaminophen, ibuprofen, naproxen, or ketoprofen unless instructed by your doctor. These medicines may hide a fever. Do not become pregnant while taking this medicine. Women should inform their doctor if they wish to become pregnant or think they might be pregnant. There is a potential for serious side effects to an unborn child. Talk to your health care professional or pharmacist for more information. Do not breast-feed an infant while taking this medicine. Men are advised not to father a child while receiving this medicine. This product may contain alcohol. Ask your pharmacist or healthcare provider if this medicine contains alcohol. Be sure to tell all healthcare providers you are taking this medicine. Certain medicines, like metronidazole and disulfiram, can cause an unpleasant reaction when taken with alcohol. The reaction includes flushing, headache, nausea, vomiting, sweating, and increased thirst. The reaction can last from 30 minutes to several hours. What side effects may I notice from receiving this medicine? Side effects that you should report to your doctor or health care professional as soon as possible:  allergic reactions like skin rash, itching or hives, swelling of the face, lips, or tongue  breathing problems  changes in vision  fast, irregular heartbeat  high or low blood pressure  mouth sores  pain, tingling, numbness in the hands or feet  signs of decreased platelets or bleeding - bruising, pinpoint red spots on the skin, black, tarry stools, blood in the urine  signs of decreased red blood cells - unusually weak or tired, feeling faint or lightheaded, falls  signs of infection - fever or chills, cough, sore throat, pain or difficulty passing urine  signs and symptoms of liver injury like dark yellow or brown urine; general ill feeling or flu-like symptoms; light-colored stools; loss of  appetite; nausea; right upper belly pain; unusually weak or tired; yellowing of the eyes or skin  swelling of the ankles, feet, hands  unusually slow heartbeat Side effects that usually do not require medical attention (report to your doctor or health care professional if they continue or are bothersome):  diarrhea  hair loss  loss of appetite  muscle or joint pain  nausea, vomiting  pain, redness, or irritation at site where injected  tiredness This list may not describe all possible side effects. Call your doctor for medical advice about side effects. You may report side effects to FDA at 1-800-FDA-1088. Where should I keep my medicine? This drug is given in a hospital or clinic and will not be stored at home. NOTE: This sheet is a summary. It may not cover all possible information. If you have  questions about this medicine, talk to your doctor, pharmacist, or health care provider.  2021 Elsevier/Gold Standard (2019-01-21 13:37:23)

## 2020-07-11 ENCOUNTER — Telehealth: Payer: Self-pay | Admitting: *Deleted

## 2020-07-13 ENCOUNTER — Telehealth: Payer: Self-pay | Admitting: Medical

## 2020-07-13 ENCOUNTER — Telehealth: Payer: Self-pay | Admitting: *Deleted

## 2020-07-13 ENCOUNTER — Inpatient Hospital Stay: Payer: 59

## 2020-07-13 ENCOUNTER — Inpatient Hospital Stay (HOSPITAL_BASED_OUTPATIENT_CLINIC_OR_DEPARTMENT_OTHER): Payer: 59 | Admitting: Medical

## 2020-07-13 ENCOUNTER — Other Ambulatory Visit: Payer: Self-pay

## 2020-07-13 VITALS — BP 144/85 | HR 79 | Temp 98.4°F | Resp 18 | Wt 328.6 lb

## 2020-07-13 DIAGNOSIS — Z5112 Encounter for antineoplastic immunotherapy: Secondary | ICD-10-CM | POA: Diagnosis not present

## 2020-07-13 DIAGNOSIS — C50511 Malignant neoplasm of lower-outer quadrant of right female breast: Secondary | ICD-10-CM | POA: Diagnosis not present

## 2020-07-13 DIAGNOSIS — Z95828 Presence of other vascular implants and grafts: Secondary | ICD-10-CM

## 2020-07-13 DIAGNOSIS — R112 Nausea with vomiting, unspecified: Secondary | ICD-10-CM

## 2020-07-13 DIAGNOSIS — Z170 Estrogen receptor positive status [ER+]: Secondary | ICD-10-CM

## 2020-07-13 DIAGNOSIS — K5909 Other constipation: Secondary | ICD-10-CM

## 2020-07-13 DIAGNOSIS — M791 Myalgia, unspecified site: Secondary | ICD-10-CM | POA: Diagnosis not present

## 2020-07-13 DIAGNOSIS — R1110 Vomiting, unspecified: Secondary | ICD-10-CM | POA: Insufficient documentation

## 2020-07-13 LAB — CBC WITH DIFFERENTIAL (CANCER CENTER ONLY)
Abs Immature Granulocytes: 0.01 10*3/uL (ref 0.00–0.07)
Basophils Absolute: 0 10*3/uL (ref 0.0–0.1)
Basophils Relative: 1 %
Eosinophils Absolute: 0.3 10*3/uL (ref 0.0–0.5)
Eosinophils Relative: 4 %
HCT: 37.1 % (ref 36.0–46.0)
Hemoglobin: 11.7 g/dL — ABNORMAL LOW (ref 12.0–15.0)
Immature Granulocytes: 0 %
Lymphocytes Relative: 29 %
Lymphs Abs: 1.8 10*3/uL (ref 0.7–4.0)
MCH: 25.9 pg — ABNORMAL LOW (ref 26.0–34.0)
MCHC: 31.5 g/dL (ref 30.0–36.0)
MCV: 82.1 fL (ref 80.0–100.0)
Monocytes Absolute: 0.2 10*3/uL (ref 0.1–1.0)
Monocytes Relative: 3 %
Neutro Abs: 3.8 10*3/uL (ref 1.7–7.7)
Neutrophils Relative %: 63 %
Platelet Count: 252 10*3/uL (ref 150–400)
RBC: 4.52 MIL/uL (ref 3.87–5.11)
RDW: 14.1 % (ref 11.5–15.5)
WBC Count: 6 10*3/uL (ref 4.0–10.5)
nRBC: 0 % (ref 0.0–0.2)

## 2020-07-13 LAB — CMP (CANCER CENTER ONLY)
ALT: 23 U/L (ref 0–44)
AST: 21 U/L (ref 15–41)
Albumin: 4.2 g/dL (ref 3.5–5.0)
Alkaline Phosphatase: 58 U/L (ref 38–126)
Anion gap: 8 (ref 5–15)
BUN: 13 mg/dL (ref 6–20)
CO2: 32 mmol/L (ref 22–32)
Calcium: 9.8 mg/dL (ref 8.9–10.3)
Chloride: 99 mmol/L (ref 98–111)
Creatinine: 0.93 mg/dL (ref 0.44–1.00)
GFR, Estimated: >60 mL/min (ref >60)
Glucose, Bld: 109 mg/dL — ABNORMAL HIGH (ref 70–99)
Potassium: 3.6 mmol/L (ref 3.5–5.1)
Sodium: 139 mmol/L (ref 135–145)
Total Bilirubin: 1.7 mg/dL — ABNORMAL HIGH (ref 0.3–1.2)
Total Protein: 7.6 g/dL (ref 6.5–8.1)

## 2020-07-13 MED ORDER — HEPARIN SOD (PORK) LOCK FLUSH 100 UNIT/ML IV SOLN
500.0000 [IU] | Freq: Once | INTRAVENOUS | Status: DC
  Filled 2020-07-13: qty 5

## 2020-07-13 MED ORDER — HEPARIN SOD (PORK) LOCK FLUSH 100 UNIT/ML IV SOLN
500.0000 [IU] | Freq: Once | INTRAVENOUS | Status: AC
  Administered 2020-07-13: 500 [IU]
  Filled 2020-07-13: qty 5

## 2020-07-13 MED ORDER — ONDANSETRON HCL 4 MG/2ML IJ SOLN
8.0000 mg | Freq: Once | INTRAMUSCULAR | Status: DC
Start: 2020-07-13 — End: 2020-07-14

## 2020-07-13 MED ORDER — SODIUM CHLORIDE 0.9% FLUSH
10.0000 mL | Freq: Once | INTRAVENOUS | Status: AC
  Administered 2020-07-13: 10 mL
  Filled 2020-07-13: qty 10

## 2020-07-13 MED ORDER — HYDROCODONE-ACETAMINOPHEN 5-325 MG PO TABS: ORAL_TABLET | ORAL | 0 refills | Status: DC

## 2020-07-13 MED ORDER — SODIUM CHLORIDE 0.9 % IV SOLN
Freq: Once | INTRAVENOUS | Status: AC
  Filled 2020-07-13: qty 250

## 2020-07-13 MED ORDER — SODIUM CHLORIDE 0.9% FLUSH
10.0000 mL | Freq: Once | INTRAVENOUS | Status: AC
Start: 2020-07-13 — End: 2020-07-13
  Administered 2020-07-13: 10 mL
  Filled 2020-07-13: qty 10

## 2020-07-13 NOTE — Progress Notes (Signed)
[facility] Progress Note   Pamela [name] [id number] 10/05/1965 55 y.o.  Pamela [name] is managed by Dr. Nicholas Lose  Actively treated with chemotherapy/immunotherapy/hormonal therapy: yes  Current therapy: Taxol and Kanjinti  Last treated: 07/08/2020 (cycle #1, day #1)  Next scheduled appointment with provider: 07/15/2020  Assessment: Plan:    Malignant neoplasm of lower-outer quadrant of right breast of female, estrogen receptor positive ([place]) - Plan: heparin lock flush 100 unit/mL, sodium chloride flush (NS) 0.9 % injection 10 mL  Non-intractable vomiting with nausea, unspecified vomiting type - Plan: 0.9 %  sodium chloride infusion, ondansetron (ZOFRAN) injection 8 mg  Myalgia - Plan: HYDROcodone-acetaminophen (NORCO) 5-325 MG tablet  Port-A-Cath in place - Plan: heparin lock flush 100 unit/mL, sodium chloride flush (NS) 0.9 % injection 10 mL  Other constipation   ER positive malignant neoplasm of the right breast: Pamela [name] is managed by Dr. Nicholas Lose and is status post cycle 1, day 1 of Taxol and Kanjinti which was dosed on 07/08/2020.  She is scheduled to be seen on 07/15/2020.  Nausea: Pamela [name] was given a liter of normal saline and Zofran 8 mg IV x1.  Her antiemetics were reviewed with her.  Myalgias: Pamela [name] was given a prescription for Vicodin with instructions that she can take 1/2 to 1 tablet every 4 hours as needed for pain.  I did tell her that I would pass along to Dr. Nicholas Lose her concerned regarding her significant pain that she experienced with her first cycle of chemotherapy.  I told her to remain as active as possible and that she could use Motrin for her pain along with hot showers.  She is concerned that she will be able to continue her treatment should she continue to experience the level of pain that she had with cycle 1 of therapy.  Constipation: This list was given a sheet regarding the management of constipation.   She was told to begin senna-S, 1 to 2 tablets twice daily and to add MiraLAX if needed.  Please see After Visit Summary for patient specific instructions.  Future Appointments  Date Time Provider [place]  07/15/2020 12:15 PM [place] FLUSH [facility] None  07/15/2020 12:30 PM Nicholas Lose, MD [facility] None  07/15/2020  1:00 PM [facility] INFUSION [facility] None  07/22/2020 12:15 PM [facility] [place] FLUSH [facility] None  07/22/2020  1:15 PM [facility] INFUSION [facility] None  07/29/2020 11:00 AM [facility] [place] FLUSH [facility] None  07/29/2020 11:45 AM Nicholas Lose, MD [facility] None  08/05/2020 11:30 AM [facility] [place] FLUSH [facility] None  08/05/2020 12:30 PM [facility] INFUSION [facility] None  08/12/2020 11:30 AM [facility] [place] FLUSH [facility] None  08/12/2020 12:30 PM [facility] INFUSION [facility] None  08/29/2020  9:00 AM Suanne Marker, PTA [facility] None  09/02/2020  8:30 AM Lorretta Harp, MD [facility]  12/22/2020  9:30 AM [name], MD [facility] None  06/21/2021  9:20 AM [name], MD [facility] None    No orders of the defined types were placed in this encounter.      Subjective:   Patient ID:  Pamela [name] is a 55 y.o. (DOB 1965-09-13) female.  Chief Complaint: No chief complaint on file.   HPI Pamela [name]  is a 55 y.o. female with a diagnosis of an ER positive malignant neoplasm of the right breast.  She is followed by Dr. Nicholas Lose and is status post cycle 1, day 1 of Taxol and Kanjinti which was  dosed on 07/08/2020.  She presents today with a report that she has had severe generalized body aches for 5 days which are unrelieved with her use of Tylenol every 6 hours and Claritin every 12 hours.  She also reports that she has had nausea, mild constipation, headaches and intense bone and body aches.  She reports that her body aches have been 8/10 in severity.  She has been taking Tylenol without relief.  She reports today  that she is unsure if she will be able to continue with chemotherapy if her pain is at this level with her next cycle of chemotherapy..   Medications: I have reviewed the patient's current medications.  Allergies:  Allergies  Allergen Reactions  . Pollen Extract Shortness Of Breath  . Latex Rash  . Codeine Hives  . Shellfish Allergy Hives  . Betadine [Povidone Iodine] Rash    Past Medical History:  Diagnosis Date  . Asthma   . DUB (dysfunctional uterine bleeding) 2007  . Elevated cholesterol   . Family history of breast cancer 05/12/2020  . Family history of lung cancer 05/12/2020  . H/O menorrhagia 05/01/2006  . History of ovarian cyst 2007  . Hypertension 03/31/2005  . Increased BMI 07/11/2006  . Migraine   . N&V (nausea and vomiting)   . Obesity 2007  . Obstructive sleep apnea syndrome, moderate 10/27/2013   no cpap  . Weight loss 07/11/2006    Past Surgical History:  Procedure Laterality Date  . BREAST LUMPECTOMY WITH RADIOACTIVE SEED AND SENTINEL LYMPH NODE BIOPSY Right 06/02/2020   Procedure: RIGHT BREAST LUMPECTOMY WITH RADIOACTIVE SEED AND RIGHT SENTINEL LYMPH NODE BIOPSY;  Surgeon: [name], MD;  Location: [place];  Service: General;  Laterality: Right;  . BREAST REDUCTION SURGERY  05/1991  . CESAREAN SECTION    . HYSTEROSCOPY  05/01/2006  . PORTACATH PLACEMENT Right 06/02/2020   Procedure: INSERTION PORT-A-CATH;  Surgeon: [name], MD;  Location: [place];  Service: General;  Laterality: Right;    Family History  Problem Relation Age of Onset  . Diabetes Maternal Grandmother   . Hypertension Mother   . Hypertension Father   . Heart attack Father   . Bone cancer Maternal Grandfather        dx after 45  . Breast cancer Maternal Aunt        dx 75s  . Lung cancer Maternal Aunt        dx after 17    Social History   Socioeconomic History  . Marital status: Single    Spouse name: Not on file  . Number of children:  Not on file  . Years of education: Not on file  . Highest education level: Not on file  Occupational History  . Not on file  Tobacco Use  . Smoking status: Never Smoker  . Smokeless tobacco: Never Used  Vaping Use  . Vaping Use: Never used  Substance and Sexual Activity  . Alcohol use: No  . Drug use: Not Currently    Frequency: 2.0 times per week  . Sexual activity: Never    Birth control/protection: I.U.D.    Comment: Mirena  Other Topics Concern  . Not on file  Social History Narrative  . Not on file   Social Determinants of Health   Financial Resource Strain: Not on file  Food Insecurity: Not on file  Transportation Needs: Not on file  Physical Activity: Not on file  Stress: Not on file  Social Connections: Not on file  Intimate Partner Violence: Not on file    Past Medical History, Surgical history, Social history, and Family history were reviewed and updated as appropriate.   Please see review of systems for further details on the patient's review from today.   Review of Systems:  Review of Systems  Constitutional: Positive for appetite change. Negative for chills, diaphoresis and fever.  Respiratory: Negative for cough, choking, shortness of breath and wheezing.   Cardiovascular: Negative for chest pain and palpitations.  Gastrointestinal: Positive for nausea. Negative for constipation, diarrhea and vomiting.  Genitourinary: Negative for decreased urine volume.  Musculoskeletal: Positive for myalgias.  Neurological: Negative for headaches.    Objective:   Physical Exam:  BP (!) 144/85   Pulse 79   Temp 98.4 F (36.9 C) (Oral)   Resp 18   Wt (!) 328 lb 9 oz (149 kg)   LMP  (LMP Unknown) Comment: 06/2020  SpO2 98%   BMI 48.52 kg/m  ECOG: 0  Physical Exam Constitutional:      General: She is not in acute distress.    Appearance: She is not diaphoretic.  HENT:     Head: Normocephalic and atraumatic.  Eyes:     General: No scleral icterus.        Right eye: No discharge.        Left eye: No discharge.     Conjunctiva/sclera: Conjunctivae normal.  Cardiovascular:     Rate and Rhythm: Normal rate and regular rhythm.     Heart sounds: Normal heart sounds. No murmur heard. No friction rub. No gallop.   Pulmonary:     Effort: Pulmonary effort is normal. No respiratory distress.     Breath sounds: Normal breath sounds. No wheezing or rales.  Skin:    General: Skin is warm and dry.     Findings: No erythema or rash.  Neurological:     Mental Status: She is alert.     Coordination: Coordination normal.     Gait: Gait normal.  Psychiatric:        Mood and Affect: Mood normal.        Behavior: Behavior normal.        Thought Content: Thought content normal.        Judgment: Judgment normal.     Lab Review:     Component Value Date/Time   NA 139 07/13/2020 1444   NA 142 09/22/2019 1040   K 3.6 07/13/2020 1444   CL 99 07/13/2020 1444   CO2 32 07/13/2020 1444   GLUCOSE 109 (H) 07/13/2020 1444   BUN 13 07/13/2020 1444   BUN 11 09/22/2019 1040   CREATININE 0.93 07/13/2020 1444   CALCIUM 9.8 07/13/2020 1444   PROT 7.6 07/13/2020 1444   PROT 6.7 06/17/2019 1054   ALBUMIN 4.2 07/13/2020 1444   ALBUMIN 4.5 06/17/2019 1054   AST 21 07/13/2020 1444   ALT 23 07/13/2020 1444   ALKPHOS 58 07/13/2020 1444   BILITOT 1.7 (H) 07/13/2020 1444   GFRNONAA >60 07/13/2020 1444   GFRAA 77 09/22/2019 1040       Component Value Date/Time   WBC 6.0 07/13/2020 1444   WBC 8.9 02/22/2020 1529   RBC 4.52 07/13/2020 1444   HGB 11.7 (L) 07/13/2020 1444   HGB 13.2 09/22/2019 1040   HCT 37.1 07/13/2020 1444   HCT 41.3 09/22/2019 1040   PLT 252 07/13/2020 1444   PLT 247 09/22/2019 1040   MCV 82.1 07/13/2020  1444   MCV 83 09/22/2019 1040   MCH 25.9 (L) 07/13/2020 1444   MCHC 31.5 07/13/2020 1444   RDW 14.1 07/13/2020 1444   RDW 14.7 09/22/2019 1040   LYMPHSABS 1.8 07/13/2020 1444   MONOABS 0.2 07/13/2020 1444   EOSABS 0.3 07/13/2020  1444   BASOSABS 0.0 07/13/2020 1444   -------------------------------  Imaging from last 24 hours (if applicable):  Radiology interpretation: No results found.

## 2020-07-13 NOTE — Telephone Encounter (Signed)
Received call from pt with complaint of severe generalized body aches x 5 days unrelieved by tylenol q.6 hrs and Claritin q.12 hrs.  Pt also complaint of increase in nausea.  Per MD pt needing to be evaluated in [facility] today.  Scheduling message sent.

## 2020-07-13 NOTE — Telephone Encounter (Signed)
Scheduled appts per 5/11 sch msg. Pt aware.  

## 2020-07-14 MED FILL — Dexamethasone Sodium Phosphate Inj 100 MG/10ML: INTRAMUSCULAR | Qty: 1 | Status: AC

## 2020-07-14 NOTE — Progress Notes (Signed)
Patient Care Team: [name], MD as PCP - General (Internal Medicine) Mcarthur Rossetti, MD as Consulting Physician (Orthopedic Surgery) Mauro Kaufmann, RN as Oncology Nurse Navigator Rockwell Germany, RN as Oncology Nurse Navigator [name], MD as Consulting Physician (General Surgery) Nicholas Lose, MD as Consulting Physician (Hematology and Oncology) Eppie Gibson, MD as Attending Physician (Radiation Oncology)  DIAGNOSIS:    ICD-10-CM   1. Malignant neoplasm of lower-outer quadrant of right breast of female, estrogen receptor positive ([place])  C50.511    Z17.0     SUMMARY OF ONCOLOGIC HISTORY: Oncology History  Malignant neoplasm of lower-outer quadrant of right breast of female, estrogen receptor positive ([place])  05/03/2020 Initial Diagnosis   Screening mammogram showed a 1.0cm mass at the 6 o'clock position in the right breast. Diagnostic mammogram and US showed the 1.2cm mass at the 6 o'clock position in the right breast. Biopsy showed IDC, grade 3, HER-2 positive (3+), ER 15% weak, PR-, Ki67 80%.   05/11/2020 Cancer Staging   Staging form: Breast, AJCC 8th Edition - Clinical stage from 05/11/2020: Stage IA (cT1b, cN0, cM0, G3, ER+, PR-, HER2+) - Signed by Nicholas Lose, MD on 05/11/2020 Stage prefix: Initial diagnosis   05/19/2020 Genetic Testing   Negative hereditary cancer genetic testing: no pathogenic variants detected in [facility] Breast STAT Panel and Common Hereditary Cancers Panel.  The report dates are May 19, 2020 (STAT) and May 23, 2020 (Common Hereditary).   The Common Hereditary Cancers Panel offered by [facility] includes sequencing and/or deletion duplication testing of the following 47 genes: APC, ATM, AXIN2, BARD1, BMPR1A, BRCA1, BRCA2, BRIP1, CDH1, CDK4, CDKN2A (p14ARF), CDKN2A (p16INK4a), CHEK2, CTNNA1, DICER1, EPCAM (Deletion/duplication testing only), GREM1 (promoter region deletion/duplication testing only), GREM1, HOXB13, KIT, MEN1, MLH1, MSH2,  MSH3, MSH6, MUTYH, NBN, NF1, NHTL1, PALB2, PDGFRA, PMS2, POLD1, POLE, PTEN, RAD50, RAD51C, RAD51D, SDHA, SDHB, SDHC, SDHD, SMAD4, SMARCA4. STK11, TP53, TSC1, TSC2, and VHL.  The following genes were evaluated for sequence changes only: SDHA and HOXB13 c.251G>A variant only.es only: SDHA and HOXB13 c.251G>A variant only.   06/02/2020 Surgery   Right lumpectomy ([facility]): invasive and in situ ductal carcinoma, 1.3cm, clear margins, 1 right axillary lymph node negative for carcinoma.   06/02/2020 Cancer Staging   Staging form: Breast, AJCC 8th Edition - Pathologic stage from 06/02/2020: Stage IA (pT1c, pN0, cM0, G3, ER+, PR-, HER2+) - Signed by Gardenia Phlegm, NP on 06/15/2020 Stage prefix: Initial diagnosis Histologic grading system: 3 grade system   07/08/2020 -  Chemotherapy    Patient is on Treatment Plan: BREAST PACLITAXEL + TRASTUZUMAB Q7D / TRASTUZUMAB Q21D        CHIEF COMPLIANT: Cycle 2 Taxol Herceptin  INTERVAL HISTORY: Pamela [name] is a 55 y.o. with above-mentioned history of right breast cancer who underwent a right lumpectomy and is currently on adjuvant chemotherapy with Taxol Herceptin. She presents to the clinic todayfor follow-up. She could not tolerate cycle 1 of chemo well.  She had profound body aches that lasted the entire week.  She cannot get comfortable with Tylenol or Claritin or Motrin.  She was prescribed narcotics but she is intolerant therefore she did not take them.  Finally yesterday she started to feel better.  She also had nausea issues as well as profound fatigue as result of chemotherapy.  She could not work the entire week.  ALLERGIES:  is allergic to pollen extract, latex, codeine, shellfish allergy, and betadine [povidone iodine].  MEDICATIONS:  Current Outpatient Medications  Medication  Sig Dispense Refill  . amLODipine (NORVASC) 10 MG tablet TAKE 1 TABLET BY MOUTH DAILY. 90 tablet 2  . aspirin EC 81 MG tablet Take 1 tablet (81 mg total) by  mouth daily. Swallow whole. 30 tablet 11  . Azilsartan-Chlorthalidone (EDARBYCLOR) 40-25 MG TABS Take 1 tablet by mouth daily. 90 tablet 2  . HYDROcodone-acetaminophen (NORCO) 5-325 MG tablet 1/2 to 1 tablet Q 4 hours prn pain 30 tablet 0  . hydrOXYzine (ATARAX/VISTARIL) 25 MG tablet Take 1 tablet (25 mg total) by mouth every 6 (six) hours as needed for anxiety. 36 tablet 0  . rosuvastatin (CRESTOR) 40 MG tablet Take 1 tablet (40 mg total) by mouth daily. 90 tablet 3  . Vitamin D, Ergocalciferol, (DRISDOL) 1.25 MG (50000 UNIT) CAPS capsule Take 1 capsule (50,000 Units total) by mouth every 7 (seven) days. 24 capsule 1   No current facility-administered medications for this visit.    PHYSICAL EXAMINATION: ECOG PERFORMANCE STATUS: 2 - Symptomatic, <50% confined to bed  Vitals:   07/15/20 1244  BP: 133/73  Pulse: 89  Resp: 18  Temp: 98.2 F (36.8 C)  SpO2: 100%   Filed Weights   07/15/20 1244  Weight: (!) 331 lb (150.1 kg)    LABORATORY DATA:  I have reviewed the data as listed CMP Latest Ref Rng & Units 07/13/2020 07/08/2020 05/11/2020  Glucose 70 - 99 mg/dL 109(H) 124(H) 112(H)  BUN 6 - 20 mg/dL _0 Creatinine 0.44 - 1.00 mg/dL 0.93 0.94 0.97  Sodium 135 - 145 mmol/L 139 142 143  Potassium 3.5 - 5.1 mmol/L 3.6 3.6 3.9  Chloride 98 - 111 mmol/L 99 101 102  CO2 22 - 32 mmol/L 32 30 30  Calcium 8.9 - 10.3 mg/dL 9.8 9.7 9.9  Total Protein 6.5 - 8.1 g/dL 7.6 7.5 7.8  Total Bilirubin 0.3 - 1.2 mg/dL 1.7(H) 0.9 0.8  Alkaline Phos 38 - 126 U/L 58 56 56  AST 15 - 41 U/L _1 ALT 0 - 44 U/L _2 Lab Results  Component Value Date   WBC 5.3 07/15/2020   HGB 11.1 (L) 07/15/2020   HCT 34.7 (L) 07/15/2020   MCV 82.8 07/15/2020   PLT 262 07/15/2020   NEUTROABS 3.0 07/15/2020    ASSESSMENT & PLAN:  Malignant neoplasm of lower-outer quadrant of right breast of female, estrogen receptor positive ([place]) 05/03/2020:Screening mammogram showed a 1.0cm mass at the 6 o'clock  position in the right breast. Diagnostic mammogram and US showed the 1.2cm mass at the 6 o'clock position in the right breast. Biopsy showed IDC, grade 3, HER-2 positive (3+), ER 15% weak, PR-, Ki67 80%.  06/02/2020:Right lumpectomy ([facility]): invasive and in situ ductal carcinoma, 1.3cm, clear margins, 1 right axillary lymph node negative for carcinoma. ER 15% weak, PR-,HER-2 positive (3+) Ki67 80%.  Treatment plan: 1.Adjuvant chemotherapy with Taxol Herceptinfollowed byHerceptin maintenance. 2.Adjuvant radiation therapy 3.Adjuvant antiestrogen therapy ----------------------------------------------------------------------------------------------------- Current Treatment: Cycle 2  Herceptin (Taxol being discontinued for intolerance) ECHO 05/20/20: EF 60-65% Taxol toxicities: Severe body aches and pains that kept her incapacitated for the entire week.  This did not get better even with Claritin, Tylenol, Motrin etc.  This is also accompanied by profound fatigue.  She lost 10 pounds weight.   Based on such intolerance, recommended discontinuation of Taxol and treating her with Herceptin alone. We will give her weekly Herceptin for this week and next week and then make a determination if we can  switch her to every 3-week Herceptin after that. I sent a scheduling message so that they can adjust the time of infusion. Labs have been reviewed and they are excellent.  I will see her back in 2 weeks.  No orders of the defined types were placed in this encounter.  The patient has a good understanding of the overall plan. she agrees with it. she will call with any problems that may develop before the next visit here.  Total time spent: 30 mins including face to face time and time spent for planning, charting and coordination of care  Rulon Eisenmenger, MD, MPH 07/15/2020  I, Molly Dorshimer, am acting as scribe for Dr. Nicholas Lose.  I have reviewed the above documentation for accuracy and  completeness, and I agree with the above.

## 2020-07-14 NOTE — Assessment & Plan Note (Signed)
05/03/2020:Screening mammogram showed a 1.0cm mass at the 6 o'clock position in the right breast. Diagnostic mammogram and US showed the 1.2cm mass at the 6 o'clock position in the right breast. Biopsy showed IDC, grade 3, HER-2 positive (3+), ER 15% weak, PR-, Ki67 80%.  06/02/2020:Right lumpectomy ([facility]): invasive and in situ ductal carcinoma, 1.3cm, clear margins, 1 right axillary lymph node negative for carcinoma. ER 15% weak, PR-,HER-2 positive (3+) Ki67 80%.  Treatment plan: 1.Adjuvant chemotherapy with Taxol Herceptinfollowed byHerceptin maintenance. 2.Adjuvant radiation therapy 3.Adjuvant antiestrogen therapy ----------------------------------------------------------------------------------------------------- Current Treatment: Cycle 2 Taxol Herceptin ECHO 05/20/20: EF 60-65%   RTC weekly for Taxol and q 2 weeks for follow up with me

## 2020-07-15 ENCOUNTER — Other Ambulatory Visit: Payer: 59

## 2020-07-15 ENCOUNTER — Other Ambulatory Visit: Payer: Self-pay

## 2020-07-15 ENCOUNTER — Inpatient Hospital Stay: Payer: 59

## 2020-07-15 ENCOUNTER — Inpatient Hospital Stay (HOSPITAL_BASED_OUTPATIENT_CLINIC_OR_DEPARTMENT_OTHER): Payer: 59 | Admitting: Hematology and Oncology

## 2020-07-15 ENCOUNTER — Encounter: Payer: Self-pay | Admitting: *Deleted

## 2020-07-15 DIAGNOSIS — C50511 Malignant neoplasm of lower-outer quadrant of right female breast: Secondary | ICD-10-CM

## 2020-07-15 DIAGNOSIS — Z170 Estrogen receptor positive status [ER+]: Secondary | ICD-10-CM

## 2020-07-15 DIAGNOSIS — Z5112 Encounter for antineoplastic immunotherapy: Secondary | ICD-10-CM | POA: Diagnosis not present

## 2020-07-15 LAB — CBC WITH DIFFERENTIAL (CANCER CENTER ONLY)
Abs Immature Granulocytes: 0.02 10*3/uL (ref 0.00–0.07)
Basophils Absolute: 0.1 10*3/uL (ref 0.0–0.1)
Basophils Relative: 1 %
Eosinophils Absolute: 0.3 10*3/uL (ref 0.0–0.5)
Eosinophils Relative: 5 %
HCT: 34.7 % — ABNORMAL LOW (ref 36.0–46.0)
Hemoglobin: 11.1 g/dL — ABNORMAL LOW (ref 12.0–15.0)
Immature Granulocytes: 0 %
Lymphocytes Relative: 31 %
Lymphs Abs: 1.6 10*3/uL (ref 0.7–4.0)
MCH: 26.5 pg (ref 26.0–34.0)
MCHC: 32 g/dL (ref 30.0–36.0)
MCV: 82.8 fL (ref 80.0–100.0)
Monocytes Absolute: 0.3 10*3/uL (ref 0.1–1.0)
Monocytes Relative: 6 %
Neutro Abs: 3 10*3/uL (ref 1.7–7.7)
Neutrophils Relative %: 57 %
Platelet Count: 262 10*3/uL (ref 150–400)
RBC: 4.19 MIL/uL (ref 3.87–5.11)
RDW: 14.3 % (ref 11.5–15.5)
WBC Count: 5.3 10*3/uL (ref 4.0–10.5)
nRBC: 0 % (ref 0.0–0.2)

## 2020-07-15 LAB — CMP (CANCER CENTER ONLY)
ALT: 21 U/L (ref 0–44)
AST: 22 U/L (ref 15–41)
Albumin: 4 g/dL (ref 3.5–5.0)
Alkaline Phosphatase: 56 U/L (ref 38–126)
Anion gap: 9 (ref 5–15)
BUN: 11 mg/dL (ref 6–20)
CO2: 30 mmol/L (ref 22–32)
Calcium: 9.5 mg/dL (ref 8.9–10.3)
Chloride: 101 mmol/L (ref 98–111)
Creatinine: 0.9 mg/dL (ref 0.44–1.00)
GFR, Estimated: >60 mL/min (ref >60)
Glucose, Bld: 108 mg/dL — ABNORMAL HIGH (ref 70–99)
Potassium: 3.6 mmol/L (ref 3.5–5.1)
Sodium: 140 mmol/L (ref 135–145)
Total Bilirubin: 1 mg/dL (ref 0.3–1.2)
Total Protein: 7.4 g/dL (ref 6.5–8.1)

## 2020-07-15 MED ORDER — SODIUM CHLORIDE 0.9 % IV SOLN
Freq: Once | INTRAVENOUS | Status: AC
  Filled 2020-07-15: qty 250

## 2020-07-15 MED ORDER — HEPARIN SOD (PORK) LOCK FLUSH 100 UNIT/ML IV SOLN
500.0000 [IU] | Freq: Once | INTRAVENOUS | Status: AC | PRN
  Administered 2020-07-15: 500 [IU]
  Filled 2020-07-15: qty 5

## 2020-07-15 MED ORDER — TRASTUZUMAB-ANNS CHEMO 150 MG IV SOLR
300.0000 mg | Freq: Once | INTRAVENOUS | Status: AC
  Administered 2020-07-15: 300 mg via INTRAVENOUS
  Filled 2020-07-15: qty 14.29

## 2020-07-15 MED ORDER — ACETAMINOPHEN 325 MG PO TABS
650.0000 mg | ORAL_TABLET | Freq: Once | ORAL | Status: AC
  Administered 2020-07-15: 650 mg via ORAL

## 2020-07-15 MED ORDER — SODIUM CHLORIDE 0.9% FLUSH
10.0000 mL | INTRAVENOUS | Status: DC | PRN
  Administered 2020-07-15: 10 mL
  Filled 2020-07-15: qty 10

## 2020-07-15 MED ORDER — ACETAMINOPHEN 325 MG PO TABS
ORAL_TABLET | ORAL | Status: AC
  Filled 2020-07-15: qty 2

## 2020-07-15 MED ORDER — DIPHENHYDRAMINE HCL 50 MG/ML IJ SOLN
25.0000 mg | Freq: Once | INTRAMUSCULAR | Status: AC
  Administered 2020-07-15: 25 mg via INTRAVENOUS

## 2020-07-15 MED ORDER — DIPHENHYDRAMINE HCL 50 MG/ML IJ SOLN
INTRAMUSCULAR | Status: AC
  Filled 2020-07-15: qty 1

## 2020-07-15 NOTE — Patient Instructions (Signed)
[facility]ICAL [facility]COLOG[facility]  Discharge Instructions: °Thank you for choosing [facility] to provide your oncology and hematology care.  ° °If you have a lab appointment with the Cancer Center, please go directly to the Cancer Center and check in at the registration area. °  °Wear comfortable clothing and clothing appropriate for easy access to any Portacath or PICC line.  ° °We strive to give you quality time with your provider. You may need to reschedule your appointment if you arrive late (15 or more minutes).  Arriving late affects you and other patients whose appointments are after yours.  Also, if you miss three or more appointments without notifying the office, you may be dismissed from the clinic at the provider’s discretion.    °  °For prescription refill requests, have your pharmacy contact our office and allow 72 hours for refills to be completed.   ° °Today you received the following chemotherapy and/or immunotherapy agents: Kanjinti °  °To help prevent nausea and vomiting after your treatment, we encourage you to take your nausea medication as directed. ° °BELOW ARE SYMPTOMS THAT SHOULD BE REPORTED IMMEDIATELY: °*FEVER GREATER THAN 100.4 F (38 °C) OR HIGHER °*CHILLS OR SWEATING °*NAUSEA AND VOMITING THAT IS NOT CONTROLLED WITH YOUR NAUSEA MEDICATION °*UNUSUAL SHORTNESS OF BREATH °*UNUSUAL BRUISING OR BLEEDING °*URINARY PROBLEMS (pain or burning when urinating, or frequent urination) °*BOWEL PROBLEMS (unusual diarrhea, constipation, pain near the anus) °TENDERNESS IN MOUTH AND THROAT WITH OR WITHOUT PRESENCE OF ULCERS (sore throat, sores in mouth, or a toothache) °UNUSUAL RASH, SWELLING OR PAIN  °UNUSUAL VAGINAL DISCHARGE OR ITCHING  ° °Items with * indicate a potential emergency and should be followed up as soon as possible or go to the Emergency Department if any problems should occur. ° °Please show the CHEMOTHERAPY ALERT CARD or IMMUNOTHERAPY ALERT CARD at check-in to the  Emergency Department and triage nurse. ° °Should you have questions after your visit or need to cancel or reschedule your appointment, please contact [facility]  Dept: [phone]  and follow the prompts.  Office hours are 8:00 a.m. to 4:30 p.m. Monday - Friday. Please note that voicemails left after 4:00 p.m. may not be returned until the following business day.  We are closed weekends and major holidays. You have access to a nurse at all times for urgent questions. Please call the main number to the clinic Dept: [phone] and follow the prompts. ° ° °For any non-urgent questions, you may also contact your provider using MyChart. We now offer e-Visits for anyone 18 and older to request care online for non-urgent symptoms. For details visit [email or web address]. °  °Also download the MyChart app! Go to the app store, search "MyChart", open the app, select [facility], and log in with your MyChart username and password. ° °Due to Covid, a mask is required upon entering the hospital/clinic. If you do not have a mask, one will be given to you upon arrival. For doctor visits, patients may have 1 support person aged 18 or older with them. For treatment visits, patients cannot have anyone with them due to current Covid guidelines and our immunocompromised population.  ° °

## 2020-07-21 ENCOUNTER — Encounter: Payer: Self-pay | Admitting: *Deleted

## 2020-07-22 ENCOUNTER — Other Ambulatory Visit: Payer: 59

## 2020-07-22 ENCOUNTER — Other Ambulatory Visit: Payer: Self-pay

## 2020-07-22 ENCOUNTER — Inpatient Hospital Stay: Payer: 59

## 2020-07-22 VITALS — BP 101/63 | HR 82 | Temp 98.5°F | Resp 16

## 2020-07-22 DIAGNOSIS — Z170 Estrogen receptor positive status [ER+]: Secondary | ICD-10-CM

## 2020-07-22 DIAGNOSIS — Z5112 Encounter for antineoplastic immunotherapy: Secondary | ICD-10-CM | POA: Diagnosis not present

## 2020-07-22 DIAGNOSIS — C50511 Malignant neoplasm of lower-outer quadrant of right female breast: Secondary | ICD-10-CM

## 2020-07-22 MED ORDER — ACETAMINOPHEN 325 MG PO TABS
650.0000 mg | ORAL_TABLET | Freq: Once | ORAL | Status: AC
  Administered 2020-07-22: 650 mg via ORAL

## 2020-07-22 MED ORDER — DIPHENHYDRAMINE HCL 50 MG/ML IJ SOLN
INTRAMUSCULAR | Status: AC
  Filled 2020-07-22: qty 1

## 2020-07-22 MED ORDER — DIPHENHYDRAMINE HCL 50 MG/ML IJ SOLN
25.0000 mg | Freq: Once | INTRAMUSCULAR | Status: AC
  Administered 2020-07-22: 25 mg via INTRAVENOUS

## 2020-07-22 MED ORDER — ACETAMINOPHEN 325 MG PO TABS
ORAL_TABLET | ORAL | Status: AC
  Filled 2020-07-22: qty 2

## 2020-07-22 MED ORDER — SODIUM CHLORIDE 0.9 % IV SOLN
Freq: Once | INTRAVENOUS | Status: AC
  Filled 2020-07-22: qty 250

## 2020-07-22 MED ORDER — HEPARIN SOD (PORK) LOCK FLUSH 100 UNIT/ML IV SOLN
500.0000 [IU] | Freq: Once | INTRAVENOUS | Status: AC | PRN
  Administered 2020-07-22: 500 [IU]
  Filled 2020-07-22: qty 5

## 2020-07-22 MED ORDER — TRASTUZUMAB-ANNS CHEMO 150 MG IV SOLR
300.0000 mg | Freq: Once | INTRAVENOUS | Status: AC
  Administered 2020-07-22: 300 mg via INTRAVENOUS
  Filled 2020-07-22: qty 14.29

## 2020-07-22 MED ORDER — DIPHENHYDRAMINE HCL 25 MG PO CAPS
ORAL_CAPSULE | ORAL | Status: AC
  Filled 2020-07-22: qty 1

## 2020-07-22 MED ORDER — SODIUM CHLORIDE 0.9% FLUSH
10.0000 mL | INTRAVENOUS | Status: DC | PRN
  Administered 2020-07-22: 10 mL
  Filled 2020-07-22: qty 10

## 2020-07-22 NOTE — Patient Instructions (Signed)
[facility]  Discharge Instructions: Thank you for choosing [facility] to provide your oncology and hematology care.   If you have a lab appointment with the [facility], please go directly to the [facility] and check in at the registration area.   Wear comfortable clothing and clothing appropriate for easy access to any Portacath or PICC line.   We strive to give you quality time with your provider. You may need to reschedule your appointment if you arrive late (15 or more minutes).  Arriving late affects you and other patients whose appointments are after yours.  Also, if you miss three or more appointments without notifying the office, you may be dismissed from the clinic at the provider's discretion.      For prescription refill requests, have your pharmacy contact our office and allow 72 hours for refills to be completed.    Today you received the following chemotherapy and/or immunotherapy agents: Herceptin   To help prevent nausea and vomiting after your treatment, we encourage you to take your nausea medication as directed.  BELOW ARE SYMPTOMS THAT SHOULD BE REPORTED IMMEDIATELY: . *FEVER GREATER THAN 100.4 F (38 C) OR HIGHER . *CHILLS OR SWEATING . *NAUSEA AND VOMITING THAT IS NOT CONTROLLED WITH YOUR NAUSEA MEDICATION . *UNUSUAL SHORTNESS OF BREATH . *UNUSUAL BRUISING OR BLEEDING . *URINARY PROBLEMS (pain or burning when urinating, or frequent urination) . *BOWEL PROBLEMS (unusual diarrhea, constipation, pain near the anus) . TENDERNESS IN MOUTH AND THROAT WITH OR WITHOUT PRESENCE OF ULCERS (sore throat, sores in mouth, or a toothache) . UNUSUAL RASH, SWELLING OR PAIN  . UNUSUAL VAGINAL DISCHARGE OR ITCHING   Items with * indicate a potential emergency and should be followed up as soon as possible or go to the Emergency Department if any problems should occur.  Please show the CHEMOTHERAPY ALERT CARD or IMMUNOTHERAPY ALERT  CARD at check-in to the Emergency Department and triage nurse.  Should you have questions after your visit or need to cancel or reschedule your appointment, please contact [facility]  Dept: [phone]  and follow the prompts.  Office hours are 8:00 a.m. to 4:30 p.m. Monday - Friday. Please note that voicemails left after 4:00 p.m. may not be returned until the following business day.  We are closed weekends and major holidays. You have access to a nurse at all times for urgent questions. Please call the main number to the clinic Dept: [phone] and follow the prompts.   For any non-urgent questions, you may also contact your provider using MyChart. We now offer e-Visits for anyone 60 and older to request care online for non-urgent symptoms. For details visit [email or web address].   Also download the MyChart app! Go to the app store, search "MyChart", open the app, select [facility], and log in with your MyChart username and password.  Due to Covid, a mask is required upon entering the hospital/clinic. If you do not have a mask, one will be given to you upon arrival. For doctor visits, patients may have 1 support person aged 7 or older with them. For treatment visits, patients cannot have anyone with them due to current Covid guidelines and our immunocompromised population.   Trastuzumab injection for infusion What is this medicine? TRASTUZUMAB (tras TOO zoo mab) is a monoclonal antibody. It is used to treat breast cancer and stomach cancer. This medicine may be used for other purposes; ask your health care provider or pharmacist  if you have questions. COMMON BRAND NAME(S): Herceptin, Galvin Proffer, Trazimera What should I tell my health care provider before I take this medicine? They need to know if you have any of these conditions:  heart disease  heart failure  lung or breathing disease, like asthma  an unusual or allergic  reaction to trastuzumab, benzyl alcohol, or other medications, foods, dyes, or preservatives  pregnant or trying to get pregnant  breast-feeding How should I use this medicine? This drug is given as an infusion into a vein. It is administered in a hospital or clinic by a specially trained health care professional. Talk to your pediatrician regarding the use of this medicine in children. This medicine is not approved for use in children. Overdosage: If you think you have taken too much of this medicine contact a poison control center or emergency room at once. NOTE: This medicine is only for you. Do not share this medicine with others. What if I miss a dose? It is important not to miss a dose. Call your doctor or health care professional if you are unable to keep an appointment. What may interact with this medicine? This medicine may interact with the following medications:  certain types of chemotherapy, such as daunorubicin, doxorubicin, epirubicin, and idarubicin This list may not describe all possible interactions. Give your health care provider a list of all the medicines, herbs, non-prescription drugs, or dietary supplements you use. Also tell them if you smoke, drink alcohol, or use illegal drugs. Some items may interact with your medicine. What should I watch for while using this medicine? Visit your doctor for checks on your progress. Report any side effects. Continue your course of treatment even though you feel ill unless your doctor tells you to stop. Call your doctor or health care professional for advice if you get a fever, chills or sore throat, or other symptoms of a cold or flu. Do not treat yourself. Try to avoid being around people who are sick. You may experience fever, chills and shaking during your first infusion. These effects are usually mild and can be treated with other medicines. Report any side effects during the infusion to your health care professional. Fever and  chills usually do not happen with later infusions. Do not become pregnant while taking this medicine or for 7 months after stopping it. Women should inform their doctor if they wish to become pregnant or think they might be pregnant. Women of child-bearing potential will need to have a negative pregnancy test before starting this medicine. There is a potential for serious side effects to an unborn child. Talk to your health care professional or pharmacist for more information. Do not breast-feed an infant while taking this medicine or for 7 months after stopping it. Women must use effective birth control with this medicine. What side effects may I notice from receiving this medicine? Side effects that you should report to your doctor or health care professional as soon as possible:  allergic reactions like skin rash, itching or hives, swelling of the face, lips, or tongue  chest pain or palpitations  cough  dizziness  feeling faint or lightheaded, falls  fever  general ill feeling or flu-like symptoms  signs of worsening heart failure like breathing problems; swelling in your legs and feet  unusually weak or tired Side effects that usually do not require medical attention (report to your doctor or health care professional if they continue or are bothersome):  bone pain  changes  in taste  diarrhea  joint pain  nausea/vomiting  weight loss This list may not describe all possible side effects. Call your doctor for medical advice about side effects. You may report side effects to FDA at 1-800-FDA-1088. Where should I keep my medicine? This drug is given in a hospital or clinic and will not be stored at home. NOTE: This sheet is a summary. It may not cover all possible information. If you have questions about this medicine, talk to your doctor, pharmacist, or health care provider.  2021 Elsevier/Gold Standard (2016-02-14 14:37:52)

## 2020-07-29 ENCOUNTER — Inpatient Hospital Stay: Payer: 59

## 2020-07-29 ENCOUNTER — Other Ambulatory Visit: Payer: 59

## 2020-07-29 ENCOUNTER — Inpatient Hospital Stay (HOSPITAL_BASED_OUTPATIENT_CLINIC_OR_DEPARTMENT_OTHER): Payer: 59 | Admitting: Hematology and Oncology

## 2020-07-29 ENCOUNTER — Encounter: Payer: Self-pay | Admitting: *Deleted

## 2020-07-29 ENCOUNTER — Other Ambulatory Visit: Payer: Self-pay

## 2020-07-29 DIAGNOSIS — Z170 Estrogen receptor positive status [ER+]: Secondary | ICD-10-CM | POA: Diagnosis not present

## 2020-07-29 DIAGNOSIS — C50511 Malignant neoplasm of lower-outer quadrant of right female breast: Secondary | ICD-10-CM

## 2020-07-29 DIAGNOSIS — Z5112 Encounter for antineoplastic immunotherapy: Secondary | ICD-10-CM | POA: Diagnosis not present

## 2020-07-29 DIAGNOSIS — Z95828 Presence of other vascular implants and grafts: Secondary | ICD-10-CM

## 2020-07-29 LAB — CMP (CANCER CENTER ONLY)
ALT: 16 U/L (ref 0–44)
AST: 17 U/L (ref 15–41)
Albumin: 3.8 g/dL (ref 3.5–5.0)
Alkaline Phosphatase: 63 U/L (ref 38–126)
Anion gap: 11 (ref 5–15)
BUN: 12 mg/dL (ref 6–20)
CO2: 28 mmol/L (ref 22–32)
Calcium: 9.7 mg/dL (ref 8.9–10.3)
Chloride: 103 mmol/L (ref 98–111)
Creatinine: 0.89 mg/dL (ref 0.44–1.00)
GFR, Estimated: >60 mL/min (ref >60)
Glucose, Bld: 130 mg/dL — ABNORMAL HIGH (ref 70–99)
Potassium: 3.5 mmol/L (ref 3.5–5.1)
Sodium: 142 mmol/L (ref 135–145)
Total Bilirubin: 0.7 mg/dL (ref 0.3–1.2)
Total Protein: 7.2 g/dL (ref 6.5–8.1)

## 2020-07-29 LAB — CBC WITH DIFFERENTIAL (CANCER CENTER ONLY)
Abs Immature Granulocytes: 0.37 10*3/uL — ABNORMAL HIGH (ref 0.00–0.07)
Basophils Absolute: 0 10*3/uL (ref 0.0–0.1)
Basophils Relative: 1 %
Eosinophils Absolute: 0.2 10*3/uL (ref 0.0–0.5)
Eosinophils Relative: 2 %
HCT: 32.2 % — ABNORMAL LOW (ref 36.0–46.0)
Hemoglobin: 10.4 g/dL — ABNORMAL LOW (ref 12.0–15.0)
Immature Granulocytes: 4 %
Lymphocytes Relative: 18 %
Lymphs Abs: 1.6 10*3/uL (ref 0.7–4.0)
MCH: 26.4 pg (ref 26.0–34.0)
MCHC: 32.3 g/dL (ref 30.0–36.0)
MCV: 81.7 fL (ref 80.0–100.0)
Monocytes Absolute: 0.7 10*3/uL (ref 0.1–1.0)
Monocytes Relative: 8 %
Neutro Abs: 5.7 10*3/uL (ref 1.7–7.7)
Neutrophils Relative %: 67 %
Platelet Count: 238 10*3/uL (ref 150–400)
RBC: 3.94 MIL/uL (ref 3.87–5.11)
RDW: 14.5 % (ref 11.5–15.5)
WBC Count: 8.4 10*3/uL (ref 4.0–10.5)
nRBC: 0 % (ref 0.0–0.2)

## 2020-07-29 MED ORDER — SODIUM CHLORIDE 0.9% FLUSH
10.0000 mL | Freq: Once | INTRAVENOUS | Status: AC
  Administered 2020-07-29: 10 mL
  Filled 2020-07-29: qty 10

## 2020-07-29 MED ORDER — HEPARIN SOD (PORK) LOCK FLUSH 100 UNIT/ML IV SOLN
500.0000 [IU] | Freq: Once | INTRAVENOUS | Status: AC
  Administered 2020-07-29: 500 [IU]
  Filled 2020-07-29: qty 5

## 2020-07-29 NOTE — Assessment & Plan Note (Addendum)
05/03/2020:Screening mammogram showed a 1.0cm mass at the 6 o'clock position in the right breast. Diagnostic mammogram and US showed the 1.2cm mass at the 6 o'clock position in the right breast. Biopsy showed IDC, grade 3, HER-2 positive (3+), ER 15% weak, PR-, Ki67 80%.  06/02/2020:Right lumpectomy ([facility]): invasive and in situ ductal carcinoma, 1.3cm, clear margins, 1 right axillary lymph node negative for carcinoma. ER 15% weak, PR-,HER-2 positive (3+) Ki67 80%.  Treatment plan: 1.Adjuvant chemotherapy with Taxol Herceptinfollowed byHerceptin maintenance. 2.Adjuvant radiation therapy 3.Adjuvant antiestrogen therapy ----------------------------------------------------------------------------------------------------- Current Treatment: Cycle 4  Herceptin (Taxol discontinued for intolerance) ECHO 05/20/20: EF 60-65% Taxol toxicities: Severe body aches and pains that kept her incapacitated for the entire week.  This did not get better even with Claritin, Tylenol, Motrin etc.  This is also accompanied by profound fatigue.  She lost 10 pounds weight.   She cannot proceed to radiation. Return to clinic every 3 weeks for Herceptin every 6 weeks with follow-up with me with labs

## 2020-07-29 NOTE — Progress Notes (Signed)
Patient Care Team: [name], MD as PCP - General (Internal Medicine) Mcarthur Rossetti, MD as Consulting Physician (Orthopedic Surgery) Mauro Kaufmann, RN as Oncology Nurse Navigator Rockwell Germany, RN as Oncology Nurse Navigator [name], MD as Consulting Physician (General Surgery) Nicholas Lose, MD as Consulting Physician (Hematology and Oncology) Eppie Gibson, MD as Attending Physician (Radiation Oncology)  DIAGNOSIS:  Encounter Diagnosis  Name Primary?  . Malignant neoplasm of lower-outer quadrant of right breast of female, estrogen receptor positive ([place])     SUMMARY OF ONCOLOGIC HISTORY: Oncology History  Malignant neoplasm of lower-outer quadrant of right breast of female, estrogen receptor positive ([place])  05/03/2020 Initial Diagnosis   Screening mammogram showed a 1.0cm mass at the 6 o'clock position in the right breast. Diagnostic mammogram and US showed the 1.2cm mass at the 6 o'clock position in the right breast. Biopsy showed IDC, grade 3, HER-2 positive (3+), ER 15% weak, PR-, Ki67 80%.   05/11/2020 Cancer Staging   Staging form: Breast, AJCC 8th Edition - Clinical stage from 05/11/2020: Stage IA (cT1b, cN0, cM0, G3, ER+, PR-, HER2+) - Signed by Nicholas Lose, MD on 05/11/2020 Stage prefix: Initial diagnosis   05/19/2020 Genetic Testing   Negative hereditary cancer genetic testing: no pathogenic variants detected in [facility] Breast STAT Panel and Common Hereditary Cancers Panel.  The report dates are May 19, 2020 (STAT) and May 23, 2020 (Common Hereditary).   The Common Hereditary Cancers Panel offered by [facility] includes sequencing and/or deletion duplication testing of the following 47 genes: APC, ATM, AXIN2, BARD1, BMPR1A, BRCA1, BRCA2, BRIP1, CDH1, CDK4, CDKN2A (p14ARF), CDKN2A (p16INK4a), CHEK2, CTNNA1, DICER1, EPCAM (Deletion/duplication testing only), GREM1 (promoter region deletion/duplication testing only), GREM1, HOXB13, KIT, MEN1, MLH1, MSH2,  MSH3, MSH6, MUTYH, NBN, NF1, NHTL1, PALB2, PDGFRA, PMS2, POLD1, POLE, PTEN, RAD50, RAD51C, RAD51D, SDHA, SDHB, SDHC, SDHD, SMAD4, SMARCA4. STK11, TP53, TSC1, TSC2, and VHL.  The following genes were evaluated for sequence changes only: SDHA and HOXB13 c.251G>A variant only.es only: SDHA and HOXB13 c.251G>A variant only.   06/02/2020 Surgery   Right lumpectomy ([facility]): invasive and in situ ductal carcinoma, 1.3cm, clear margins, 1 right axillary lymph node negative for carcinoma.   06/02/2020 Cancer Staging   Staging form: Breast, AJCC 8th Edition - Pathologic stage from 06/02/2020: Stage IA (pT1c, pN0, cM0, G3, ER+, PR-, HER2+) - Signed by Gardenia Phlegm, NP on 06/15/2020 Stage prefix: Initial diagnosis Histologic grading system: 3 grade system   07/08/2020 -  Chemotherapy    Patient is on Treatment Plan: BREAST PACLITAXEL + TRASTUZUMAB Q7D / TRASTUZUMAB Q21D        CHIEF COMPLIANT: Follow-up to discuss treatment plan  INTERVAL HISTORY: Pamela [name] is a 55 year old with above-mentioned history of right breast cancer treated with lumpectomy and we gave her Taxol Herceptin but she could not tolerate Taxol and therefore it was discontinued.  She is here today to discuss further adjuvant treatment plan.  She is recovering very well from the  side effects related to Taxol.  She is tolerating Herceptin very well.  She had lost significant amount of hair and therefore she had to shave her head.   ALLERGIES:  is allergic to pollen extract, latex, codeine, shellfish allergy, and betadine [povidone iodine].  MEDICATIONS:  Current Outpatient Medications  Medication Sig Dispense Refill  . amLODipine (NORVASC) 10 MG tablet TAKE 1 TABLET BY MOUTH DAILY. 90 tablet 2  . aspirin EC 81 MG tablet Take 1 tablet (81 mg total) by mouth  daily. Swallow whole. 30 tablet 11  . Azilsartan-Chlorthalidone (EDARBYCLOR) 40-25 MG TABS Take 1 tablet by mouth daily. 90 tablet 2  . HYDROcodone-acetaminophen  (NORCO) 5-325 MG tablet 1/2 to 1 tablet Q 4 hours prn pain 30 tablet 0  . hydrOXYzine (ATARAX/VISTARIL) 25 MG tablet Take 1 tablet (25 mg total) by mouth every 6 (six) hours as needed for anxiety. 36 tablet 0  . rosuvastatin (CRESTOR) 40 MG tablet Take 1 tablet (40 mg total) by mouth daily. 90 tablet 3  . Vitamin D, Ergocalciferol, (DRISDOL) 1.25 MG (50000 UNIT) CAPS capsule Take 1 capsule (50,000 Units total) by mouth every 7 (seven) days. 24 capsule 1   No current facility-administered medications for this visit.    PHYSICAL EXAMINATION: ECOG PERFORMANCE STATUS: 1 - Symptomatic but completely ambulatory  Vitals:   07/29/20 1202  BP: (!) 150/63  Pulse: 78  Resp: 18  Temp: 97.8 F (36.6 C)  SpO2: 100%   Filed Weights   07/29/20 1202  Weight: (!) 340 lb 1.6 oz (154.3 kg)      LABORATORY DATA:  I have reviewed the data as listed CMP Latest Ref Rng & Units 07/29/2020 07/15/2020 07/13/2020  Glucose 70 - 99 mg/dL 130(H) 108(H) 109(H)  BUN 6 - 20 mg/dL 12 11 13   Creatinine 0.44 - 1.00 mg/dL 0.89 0.90 0.93  Sodium 135 - 145 mmol/L 142 140 139  Potassium 3.5 - 5.1 mmol/L 3.5 3.6 3.6  Chloride 98 - 111 mmol/L 103 101 99  CO2 22 - 32 mmol/L 28 30 32  Calcium 8.9 - 10.3 mg/dL 9.7 9.5 9.8  Total Protein 6.5 - 8.1 g/dL 7.2 7.4 7.6  Total Bilirubin 0.3 - 1.2 mg/dL 0.7 1.0 1.7(H)  Alkaline Phos 38 - 126 U/L 63 56 58  AST 15 - 41 U/L 17 22 21   ALT 0 - 44 U/L 16 21 23     Lab Results  Component Value Date   WBC 8.4 07/29/2020   HGB 10.4 (L) 07/29/2020   HCT 32.2 (L) 07/29/2020   MCV 81.7 07/29/2020   PLT 238 07/29/2020   NEUTROABS 5.7 07/29/2020    ASSESSMENT & PLAN:  Malignant neoplasm of lower-outer quadrant of right breast of female, estrogen receptor positive ([place]) 05/03/2020:Screening mammogram showed a 1.0cm mass at the 6 o'clock position in the right breast. Diagnostic mammogram and US showed the 1.2cm mass at the 6 o'clock position in the right breast. Biopsy showed IDC,  grade 3, HER-2 positive (3+), ER 15% weak, PR-, Ki67 80%.  06/02/2020:Right lumpectomy ([facility]): invasive and in situ ductal carcinoma, 1.3cm, clear margins, 1 right axillary lymph node negative for carcinoma. ER 15% weak, PR-,HER-2 positive (3+) Ki67 80%.  Treatment plan: 1.Adjuvant chemotherapy with Taxol Herceptinfollowed byHerceptin maintenance. 2.Adjuvant radiation therapy 3.Adjuvant antiestrogen therapy ----------------------------------------------------------------------------------------------------- Current Treatment: Herceptin every 3 weeks (next treatment 08/05/2020) ECHO 05/20/20: EF 60-65% Taxol toxicities: Severe body aches and pains that kept her incapacitated for the entire week.  This did not get better even with Claritin, Tylenol, Motrin etc.  This is also accompanied by profound fatigue.  She lost 10 pounds weight.   She can  proceed to radiation. Return to clinic every 3 weeks for Herceptin every 6 weeks with follow-up with me with labs    No orders of the defined types were placed in this encounter.  The patient has a good understanding of the overall plan. she agrees with it. she will call with any problems that may develop before the next visit here.  Total time spent: 30 mins including face to face time and time spent for planning, charting and co-ordination of care   Harriette Ohara, MD 07/29/20

## 2020-08-03 ENCOUNTER — Telehealth: Payer: Self-pay

## 2020-08-03 ENCOUNTER — Encounter: Payer: Self-pay | Admitting: *Deleted

## 2020-08-03 NOTE — Telephone Encounter (Signed)
Voicemail not setup,  I returned the pt's call because she left a message that she needed an appt.

## 2020-08-04 ENCOUNTER — Telehealth: Payer: Self-pay | Admitting: *Deleted

## 2020-08-04 NOTE — Progress Notes (Signed)
Location of Breast Cancer: Malignant neoplasm of lower-outer quadrant of RIGHT breast, estrogen receptor positive   Histology per Pathology Report:  06/02/2020 FINAL MICROSCOPIC DIAGNOSIS:  A. BREAST, RIGHT, LUMPECTOMY:  - Invasive and in situ ductal carcinoma, 1.3 cm.  - Margins not involved.  - Biopsy site and biopsy clip.  - See oncology table.  B. BREAST, RIGHT ADDITIONAL ANTERIO-SUPERIOR MARGIN, EXCISION:  - Benign breast tissue.  - No evidence of malignancy.  - Final anterior-superior margin free of tumor.  C. BREAST, RIGHT ADDITIONAL MEDIAL-POSTERIOR MARGIN, EXCISION:  - Benign breast tissue.  - No evidence of malignancy.  - Final medial-posterior margin free of tumor.  D. BREAST, RIGHT ADDITIONAL INFERIOr-LATERAL MARGIN, EXCISION:  - Benign breast tissue.  - No evidence of malignancy.  - Final inferior-lateral margin free of tumor.  E. LYMPH NODE, RIGHT AXILLARY, SENTINEL, EXCISION:  - One lymph node with no metastatic carcinoma (0/1).   Receptor Status: ER(15%), PR (0%), Her2-neu (Positive via IHC), Ki-67(80%)  Did patient present with symptoms (if so, please note symptoms) or was this found on screening mammography?:  Screening mammogram showed a 1.0cm mass at the 6 o'clock position in the right breast. Diagnostic mammogram and US showed the 1.2cm mass at the 6 o'clock position in the right breast.  Past/Anticipated interventions by surgeon, if any: 06/02/2020 Dr. Marcello Moores [facility] --Right breast seed localized lumpectomy with right axillary sentinel lymph node mapping  --Placement of right 8 French internal jugular port with C arm and ultrasound guidance  Past/Anticipated interventions by medical oncology, if any:  Under care of Dr. Nicholas Lose 07/29/2020 --Treatment plan: 1.Adjuvant chemotherapy with Taxol/Herceptinfollowed byHerceptin maintenance. 2.Adjuvant radiation therapy 3.Adjuvant antiestrogen therapy --Current Treatment: Herceptin every 3 weeks  (next treatment 08/05/2020) --Taxol toxicities: Severe body aches and pains that kept her incapacitated for the entire week. This did not get better even with Claritin, Tylenol, Motrin etc. This is also accompanied by profound fatigue. She lost 10 pounds weight. --She can  proceed to radiation. --Return to clinic every 3 weeks for Herceptin every 6 weeks with follow-up with me with labs  Lymphedema issues, if any:  Patient denies    Pain issues, if any: Patient denies  SAFETY ISSUES:  Prior radiation? No  Pacemaker/ICD? No  Possible current pregnancy? No--IUD in place  Is the patient on methotrexate? No  Current Complaints / other details:  Patient has received the first 3 Pfizer vaccines

## 2020-08-04 NOTE — Telephone Encounter (Signed)
Received call from pt with complaint of swollen nodule and tenderness behind left ear.  Per MD pt needing to be seen by PCP.  Pt states she has an apt today with Dr. Isidore Moos and will request Dr. Isidore Moos to assess prior to seeing PCP.

## 2020-08-04 NOTE — Progress Notes (Signed)
Radiation Oncology         [phone] ________________________________  Name: Pamela [name] MRN: [id number]  Date: 08/05/2020  DOB: 19-Oct-1965  Follow-Up Visit Note  Outpatient  CC: Pamela Chard, MD  Nicholas Lose, MD  Diagnosis:      ICD-10-CM   1. Malignant neoplasm of lower-outer quadrant of right breast of female, estrogen receptor positive ([place])  C50.511 LORazepam (ATIVAN) 0.5 MG tablet   Z17.0 LORazepam (ATIVAN) tablet 0.5 mg     Cancer Staging Malignant neoplasm of lower-outer quadrant of right breast of female, estrogen receptor positive ([place]) Staging form: Breast, AJCC 8th Edition - Clinical stage from 05/11/2020: Stage IA (cT1b, cN0, cM0, G3, ER+, PR-, HER2+) - Signed by Nicholas Lose, MD on 05/11/2020 Stage prefix: Initial diagnosis - Pathologic stage from 06/02/2020: Stage IA (pT1c, pN0, cM0, G3, ER+, PR-, HER2+) - Signed by Gardenia Phlegm, NP on 06/15/2020 Stage prefix: Initial diagnosis Histologic grading system: 3 grade system   CHIEF COMPLAINT: Here to discuss management of right breast cancer  Narrative:  The patient returns today for follow-up. She was seen in the multidisciplinary breast clinic on 05/11/2020.   She has not undergone any significant imaging studies since consultation.  Systemic therapy, if applicable, involved (dates and therapy as follows): Four cycles of Taxol and Herceptin (Taxol discontinued secondary to intolerance) from 07/08/2020 to 07/29/2020, followed by Herceptin maintenance.  Breast/nodal surgery on the date of 06/02/2020 revealed: tumor size of 1.3 cm; histology of invasive and in-situ carcinoma; margin status to invasive disease of negative; margin status to in situ disease of negative; nodal status of negative; ER status: 15% weak; PR status: 0% negative; Her2 status: positive; Grade: 3.   Symptomatically, the patient reports: severe claustrophobia, requests medication for treatment/simulation.  Lymphedema issues,  if any:  Patient denies    Pain issues, if any: Patient denies  SAFETY ISSUES:  Prior radiation? No  Pacemaker/ICD? No  Possible current pregnancy? No--IUD in place  Is the patient on methotrexate? No  Current Complaints / other details:  Patient has received the first 3 Pfizer vaccines          ALLERGIES:  is allergic to pollen extract, latex, codeine, shellfish allergy, and betadine [povidone iodine].  Meds: Current Outpatient Medications  Medication Sig Dispense Refill  . LORazepam (ATIVAN) 0.5 MG tablet Take 1 tablet 20 minutes before radiation therapy, PRN anxiety.  May take once daily at home the week before treatment to get used to effects. 30 tablet 0  . amLODipine (NORVASC) 10 MG tablet TAKE 1 TABLET BY MOUTH DAILY. 90 tablet 2  . aspirin EC 81 MG tablet Take 1 tablet (81 mg total) by mouth daily. Swallow whole. 30 tablet 11  . Azilsartan-Chlorthalidone (EDARBYCLOR) 40-25 MG TABS Take 1 tablet by mouth daily. 90 tablet 2  . HYDROcodone-acetaminophen (NORCO) 5-325 MG tablet 1/2 to 1 tablet Q 4 hours prn pain 30 tablet 0  . hydrOXYzine (ATARAX/VISTARIL) 25 MG tablet Take 1 tablet (25 mg total) by mouth every 6 (six) hours as needed for anxiety. 36 tablet 0  . levonorgestrel (MIRENA, 52 MG,) 20 MCG/DAY IUD 1 each by Intrauterine route once.    . lidocaine-prilocaine (EMLA) cream Apply 1 application topically once.    . rosuvastatin (CRESTOR) 40 MG tablet Take 1 tablet (40 mg total) by mouth daily. 90 tablet 3  . Vitamin D, Ergocalciferol, (DRISDOL) 1.25 MG (50000 UNIT) CAPS capsule Take 1 capsule (50,000 Units total) by mouth every 7 (seven) days.  24 capsule 1   No current facility-administered medications for this encounter.    Physical Findings:  height is 5' 9"  (1.753 m) and weight is 338 lb 2 oz (153.4 kg) (abnormal). Her temporal temperature is 96.7 F (35.9 C) (abnormal). Her blood pressure is 139/70 and her pulse is 73. Her respiration is 18 and oxygen saturation is  100%. .     General: Alert and oriented, in no acute distress   Breast exam reveals excellent healing of lower central lumpectomy scar, right breast  Lab Findings: Lab Results  Component Value Date   WBC 7.5 08/05/2020   HGB 10.7 (L) 08/05/2020   HCT 33.0 (L) 08/05/2020   MCV 82.3 08/05/2020   PLT 231 08/05/2020      Radiographic Findings: No results found.  Impression/Plan: Right breast cancer  We discussed adjuvant radiotherapy today.  I recommend 4 weeks of RT to the right breast in order to reduce risk of locoregional recurrence by 2/3.  I reviewed the logistics, benefits, risks, and potential side effects of this treatment in detail. Risks may include but not necessary be limited to acute and late injury tissue in the radiation fields such as skin irritation (change in color/pigmentation, itching, dryness, pain, peeling). She may experience fatigue. We also discussed possible risk of long term cosmetic changes or scar tissue. There is also a smaller risk for lung toxicity, lymphedema, musculoskeletal changes, rib fragility or induction of a second malignancy, late chronic non-healing soft tissue wound.    The patient asked good questions which I answered to her satisfaction. She is enthusiastic about proceeding with treatment. A consent form has been signed and placed in her chart.  Lorazepam Rx'd for claustrophobia, precautions re: this med discussed w/ patient.    On date of service, in total, I spent 30 minutes on this encounter. Patient was seen in person.  _____________________________________   Eppie Gibson, MD  This document serves as a record of services personally performed by Eppie Gibson, MD. It was created on his behalf by Clerance Lav, a trained medical scribe. The creation of this record is based on the scribe's personal observations and the provider's statements to them. This document has been checked and approved by the attending provider.

## 2020-08-05 ENCOUNTER — Inpatient Hospital Stay: Payer: 59

## 2020-08-05 ENCOUNTER — Inpatient Hospital Stay: Payer: 59 | Attending: Hematology and Oncology

## 2020-08-05 ENCOUNTER — Ambulatory Visit
Admission: RE | Admit: 2020-08-05 | Discharge: 2020-08-05 | Disposition: A | Discharge status: Home / Self Care | Payer: 59 | Source: Ambulatory Visit | Attending: Radiation Oncology | Admitting: Radiation Oncology

## 2020-08-05 ENCOUNTER — Other Ambulatory Visit: Payer: 59

## 2020-08-05 ENCOUNTER — Other Ambulatory Visit: Payer: Self-pay

## 2020-08-05 VITALS — BP 131/68 | HR 81 | Temp 98.0°F | Resp 18 | Ht 69.0 in | Wt 337.0 lb

## 2020-08-05 VITALS — BP 139/70 | HR 73 | Temp 96.7°F | Resp 18 | Ht 69.0 in | Wt 338.1 lb

## 2020-08-05 DIAGNOSIS — Z5112 Encounter for antineoplastic immunotherapy: Secondary | ICD-10-CM | POA: Diagnosis present

## 2020-08-05 DIAGNOSIS — R5383 Other fatigue: Secondary | ICD-10-CM | POA: Diagnosis not present

## 2020-08-05 DIAGNOSIS — Z79899 Other long term (current) drug therapy: Secondary | ICD-10-CM | POA: Insufficient documentation

## 2020-08-05 DIAGNOSIS — Z793 Long term (current) use of hormonal contraceptives: Secondary | ICD-10-CM | POA: Insufficient documentation

## 2020-08-05 DIAGNOSIS — D649 Anemia, unspecified: Secondary | ICD-10-CM | POA: Insufficient documentation

## 2020-08-05 DIAGNOSIS — Z7982 Long term (current) use of aspirin: Secondary | ICD-10-CM | POA: Insufficient documentation

## 2020-08-05 DIAGNOSIS — C50511 Malignant neoplasm of lower-outer quadrant of right female breast: Secondary | ICD-10-CM

## 2020-08-05 DIAGNOSIS — Z885 Allergy status to narcotic agent status: Secondary | ICD-10-CM | POA: Diagnosis not present

## 2020-08-05 DIAGNOSIS — Z5111 Encounter for antineoplastic chemotherapy: Secondary | ICD-10-CM | POA: Diagnosis present

## 2020-08-05 DIAGNOSIS — Z170 Estrogen receptor positive status [ER+]: Secondary | ICD-10-CM

## 2020-08-05 DIAGNOSIS — Z95828 Presence of other vascular implants and grafts: Secondary | ICD-10-CM

## 2020-08-05 LAB — CBC WITH DIFFERENTIAL (CANCER CENTER ONLY)
Abs Immature Granulocytes: 0.07 10*3/uL (ref 0.00–0.07)
Basophils Absolute: 0 10*3/uL (ref 0.0–0.1)
Basophils Relative: 0 %
Eosinophils Absolute: 0.3 10*3/uL (ref 0.0–0.5)
Eosinophils Relative: 3 %
HCT: 33 % — ABNORMAL LOW (ref 36.0–46.0)
Hemoglobin: 10.7 g/dL — ABNORMAL LOW (ref 12.0–15.0)
Immature Granulocytes: 1 %
Lymphocytes Relative: 20 %
Lymphs Abs: 1.5 10*3/uL (ref 0.7–4.0)
MCH: 26.7 pg (ref 26.0–34.0)
MCHC: 32.4 g/dL (ref 30.0–36.0)
MCV: 82.3 fL (ref 80.0–100.0)
Monocytes Absolute: 0.7 10*3/uL (ref 0.1–1.0)
Monocytes Relative: 9 %
Neutro Abs: 5 10*3/uL (ref 1.7–7.7)
Neutrophils Relative %: 67 %
Platelet Count: 231 10*3/uL (ref 150–400)
RBC: 4.01 MIL/uL (ref 3.87–5.11)
RDW: 14.9 % (ref 11.5–15.5)
WBC Count: 7.5 10*3/uL (ref 4.0–10.5)
nRBC: 0 % (ref 0.0–0.2)

## 2020-08-05 LAB — CMP (CANCER CENTER ONLY)
ALT: 15 U/L (ref 0–44)
AST: 15 U/L (ref 15–41)
Albumin: 3.8 g/dL (ref 3.5–5.0)
Alkaline Phosphatase: 65 U/L (ref 38–126)
Anion gap: 10 (ref 5–15)
BUN: 10 mg/dL (ref 6–20)
CO2: 29 mmol/L (ref 22–32)
Calcium: 9.8 mg/dL (ref 8.9–10.3)
Chloride: 101 mmol/L (ref 98–111)
Creatinine: 0.83 mg/dL (ref 0.44–1.00)
GFR, Estimated: >60 mL/min (ref >60)
Glucose, Bld: 114 mg/dL — ABNORMAL HIGH (ref 70–99)
Potassium: 3.3 mmol/L — ABNORMAL LOW (ref 3.5–5.1)
Sodium: 140 mmol/L (ref 135–145)
Total Bilirubin: 0.7 mg/dL (ref 0.3–1.2)
Total Protein: 7.3 g/dL (ref 6.5–8.1)

## 2020-08-05 MED ORDER — LORAZEPAM 0.5 MG PO TABS: ORAL_TABLET | ORAL | 0 refills | Status: DC

## 2020-08-05 MED ORDER — TRASTUZUMAB-ANNS CHEMO 150 MG IV SOLR
6.0000 mg/kg | Freq: Once | INTRAVENOUS | Status: AC
  Administered 2020-08-05: 903 mg via INTRAVENOUS
  Filled 2020-08-05: qty 43

## 2020-08-05 MED ORDER — LORAZEPAM 1 MG PO TABS
0.5000 mg | ORAL_TABLET | Freq: Once | ORAL | Status: AC
  Administered 2020-08-05: 0.5 mg via ORAL
  Filled 2020-08-05: qty 0.5

## 2020-08-05 MED ORDER — SODIUM CHLORIDE 0.9% FLUSH
10.0000 mL | INTRAVENOUS | Status: DC | PRN
  Administered 2020-08-05: 10 mL
  Filled 2020-08-05: qty 10

## 2020-08-05 MED ORDER — DIPHENHYDRAMINE HCL 25 MG PO CAPS
50.0000 mg | ORAL_CAPSULE | Freq: Once | ORAL | Status: AC
  Administered 2020-08-05: 50 mg via ORAL

## 2020-08-05 MED ORDER — ACETAMINOPHEN 325 MG PO TABS
650.0000 mg | ORAL_TABLET | Freq: Once | ORAL | Status: AC
  Administered 2020-08-05: 650 mg via ORAL

## 2020-08-05 MED ORDER — SODIUM CHLORIDE 0.9 % IV SOLN
Freq: Once | INTRAVENOUS | Status: AC
  Filled 2020-08-05: qty 250

## 2020-08-05 MED ORDER — ACETAMINOPHEN 325 MG PO TABS
ORAL_TABLET | ORAL | Status: AC
  Filled 2020-08-05: qty 2

## 2020-08-05 MED ORDER — SODIUM CHLORIDE 0.9% FLUSH
10.0000 mL | Freq: Once | INTRAVENOUS | Status: AC
  Administered 2020-08-05: 10 mL
  Filled 2020-08-05: qty 10

## 2020-08-05 MED ORDER — DIPHENHYDRAMINE HCL 25 MG PO CAPS
ORAL_CAPSULE | ORAL | Status: AC
  Filled 2020-08-05: qty 2

## 2020-08-05 MED ORDER — LORAZEPAM 1 MG PO TABS
ORAL_TABLET | ORAL | Status: AC
  Filled 2020-08-05: qty 1

## 2020-08-05 MED ORDER — HEPARIN SOD (PORK) LOCK FLUSH 100 UNIT/ML IV SOLN
500.0000 [IU] | Freq: Once | INTRAVENOUS | Status: AC | PRN
  Administered 2020-08-05: 500 [IU]
  Filled 2020-08-05: qty 5

## 2020-08-05 NOTE — Patient Instructions (Signed)
[facility]   Discharge Instructions: Thank you for choosing [facility] to provide your oncology and hematology care.   If you have a lab appointment with the [facility], please go directly to the [facility] and check in at the registration area.   Wear comfortable clothing and clothing appropriate for easy access to any Portacath or PICC line.   We strive to give you quality time with your provider. You may need to reschedule your appointment if you arrive late (15 or more minutes).  Arriving late affects you and other patients whose appointments are after yours.  Also, if you miss three or more appointments without notifying the office, you may be dismissed from the clinic at the provider's discretion.      For prescription refill requests, have your pharmacy contact our office and allow 72 hours for refills to be completed.    Today you received the following chemotherapy and/or immunotherapy agents: Trastuzumab ([place]jinti)     To help prevent nausea and vomiting after your treatment, we encourage you to take your nausea medication as directed.  BELOW ARE SYMPTOMS THAT SHOULD BE REPORTED IMMEDIATELY: . *FEVER GREATER THAN 100.4 F (38 C) OR HIGHER . *CHILLS OR SWEATING . *NAUSEA AND VOMITING THAT IS NOT CONTROLLED WITH YOUR NAUSEA MEDICATION . *UNUSUAL SHORTNESS OF BREATH . *UNUSUAL BRUISING OR BLEEDING . *URINARY PROBLEMS (pain or burning when urinating, or frequent urination) . *BOWEL PROBLEMS (unusual diarrhea, constipation, pain near the anus) . TENDERNESS IN MOUTH AND THROAT WITH OR WITHOUT PRESENCE OF ULCERS (sore throat, sores in mouth, or a toothache) . UNUSUAL RASH, SWELLING OR PAIN  . UNUSUAL VAGINAL DISCHARGE OR ITCHING   Items with * indicate a potential emergency and should be followed up as soon as possible or go to the Emergency Department if any problems should occur.  Please show the CHEMOTHERAPY ALERT CARD or  IMMUNOTHERAPY ALERT CARD at check-in to the Emergency Department and triage nurse.  Should you have questions after your visit or need to cancel or reschedule your appointment, please contact [facility]  Dept: [phone]  and follow the prompts.  Office hours are 8:00 a.m. to 4:30 p.m. Monday - Friday. Please note that voicemails left after 4:00 p.m. may not be returned until the following business day.  We are closed weekends and major holidays. You have access to a nurse at all times for urgent questions. Please call the main number to the clinic Dept: [phone] and follow the prompts.   For any non-urgent questions, you may also contact your provider using MyChart. We now offer e-Visits for anyone 11 and older to request care online for non-urgent symptoms. For details visit [email or web address].   Also download the MyChart app! Go to the app store, search "MyChart", open the app, select [facility], and log in with your MyChart username and password.  Due to Covid, a mask is required upon entering the hospital/clinic. If you do not have a mask, one will be given to you upon arrival. For doctor visits, patients may have 1 support person aged 49 or older with them. For treatment visits, patients cannot have anyone with them due to current Covid guidelines and our immunocompromised population.

## 2020-08-05 NOTE — Patient Instructions (Signed)

## 2020-08-06 ENCOUNTER — Encounter: Payer: Self-pay | Admitting: Radiation Oncology

## 2020-08-08 ENCOUNTER — Encounter: Payer: Self-pay | Admitting: *Deleted

## 2020-08-08 ENCOUNTER — Telehealth: Payer: Self-pay | Admitting: *Deleted

## 2020-08-08 ENCOUNTER — Other Ambulatory Visit: Payer: Self-pay

## 2020-08-08 DIAGNOSIS — Z170 Estrogen receptor positive status [ER+]: Secondary | ICD-10-CM

## 2020-08-08 DIAGNOSIS — C50511 Malignant neoplasm of lower-outer quadrant of right female breast: Secondary | ICD-10-CM

## 2020-08-08 NOTE — Telephone Encounter (Signed)
CALLED PATIENT TO ASK ABOUT COMING IN FOR LAB- PATIENT AGREED TO COME ON 08-12-20 @ 10:30 AM

## 2020-08-09 DIAGNOSIS — C50511 Malignant neoplasm of lower-outer quadrant of right female breast: Secondary | ICD-10-CM | POA: Diagnosis not present

## 2020-08-10 ENCOUNTER — Telehealth: Payer: Self-pay | Admitting: *Deleted

## 2020-08-10 NOTE — Assessment & Plan Note (Addendum)
05/03/2020:Screening mammogram showed a 1.0cm mass at the 6 o'clock position in the right breast. Diagnostic mammogram and US showed the 1.2cm mass at the 6 o'clock position in the right breast. Biopsy showed IDC, grade 3, HER-2 positive (3+), ER 15% weak, PR-, Ki67 80%.  06/02/2020:Right lumpectomy ([facility]): invasive and in situ ductal carcinoma, 1.3cm, clear margins, 1 right axillary lymph node negative for carcinoma. ER 15% weak, PR-,HER-2 positive (3+) Ki67 80%.  Treatment plan: 1.Adjuvant chemotherapy with Taxol Herceptinfollowed byHerceptin maintenance. 2.Adjuvant radiation therapy 3.Adjuvant antiestrogen therapy ----------------------------------------------------------------------------------------------------- Current Treatment: Herceptin every 3 weeks  ECHO 05/20/20: EF 60-65%  She can  proceed to radiation. Return to clinic every 3 weeks for Herceptin every 6 weeks with follow-up with me with labs

## 2020-08-10 NOTE — Telephone Encounter (Signed)
Received call from pt requesting to speak with MD regarding tx plan and herceptin.  Pt states she has several questions if additional therapy needs to be added at this time.  Pt requesting office visit to discuss in more detail with provider.  Apt scheduled and pt verbalized understanding of date and time.

## 2020-08-10 NOTE — Progress Notes (Addendum)
HEMATOLOGY-ONCOLOGY TELEPHONE VISIT PROGRESS NOTE  I connected with Pamela [name] on 08/11/2020 at 10:45 AM EDT by telephone and verified that I am speaking with the correct person using two identifiers.  I discussed the limitations, risks, security and privacy concerns of performing an evaluation and management service by telephone and the availability of in person appointments.  I also discussed with the patient that there may be a patient responsible charge related to this service. The patient expressed understanding and agreed to proceed.   History of Present Illness: Pamela [name] is a 55 y.o. female with above-mentioned history of right breast cancer treated with lumpectomy, and is currently on Herceptin maintenance. She presents over the phone today to discuss her treatment plan.  After the first cycle of Taxol we discontinued the Taxol and she is currently on Herceptin.  She thought a lot about her decision and called me today to discuss this further.  She would like to be aggressive with her cancer care and would like to receive the chemotherapy again and see if she can handle it better.  Oncology History  Malignant neoplasm of lower-outer quadrant of right breast of female, estrogen receptor positive ([place])  05/03/2020 Initial Diagnosis   Screening mammogram showed a 1.0cm mass at the 6 o'clock position in the right breast. Diagnostic mammogram and US showed the 1.2cm mass at the 6 o'clock position in the right breast. Biopsy showed IDC, grade 3, HER-2 positive (3+), ER 15% weak, PR-, Ki67 80%.   05/11/2020 Cancer Staging   Staging form: Breast, AJCC 8th Edition - Clinical stage from 05/11/2020: Stage IA (cT1b, cN0, cM0, G3, ER+, PR-, HER2+) - Signed by Pamela Lose, MD on 05/11/2020  Stage prefix: Initial diagnosis    05/19/2020 Genetic Testing   Negative hereditary cancer genetic testing: no pathogenic variants detected in [facility] Breast STAT Panel and Common Hereditary Cancers  Panel.  The report dates are May 19, 2020 (STAT) and May 23, 2020 (Common Hereditary).   The Common Hereditary Cancers Panel offered by [facility] includes sequencing and/or deletion duplication testing of the following 47 genes: APC, ATM, AXIN2, BARD1, BMPR1A, BRCA1, BRCA2, BRIP1, CDH1, CDK4, CDKN2A (p14ARF), CDKN2A (p16INK4a), CHEK2, CTNNA1, DICER1, EPCAM (Deletion/duplication testing only), GREM1 (promoter region deletion/duplication testing only), GREM1, HOXB13, KIT, MEN1, MLH1, MSH2, MSH3, MSH6, MUTYH, NBN, NF1, NHTL1, PALB2, PDGFRA, PMS2, POLD1, POLE, PTEN, RAD50, RAD51C, RAD51D, SDHA, SDHB, SDHC, SDHD, SMAD4, SMARCA4. STK11, TP53, TSC1, TSC2, and VHL.  The following genes were evaluated for sequence changes only: SDHA and HOXB13 c.251G>A variant only.es only: SDHA and HOXB13 c.251G>A variant only.   06/02/2020 Surgery   Right lumpectomy ([facility]): invasive and in situ ductal carcinoma, 1.3cm, clear margins, 1 right axillary lymph node negative for carcinoma.   06/02/2020 Cancer Staging   Staging form: Breast, AJCC 8th Edition - Pathologic stage from 06/02/2020: Stage IA (pT1c, pN0, cM0, G3, ER+, PR-, HER2+) - Signed by Pamela Phlegm, NP on 06/15/2020  Stage prefix: Initial diagnosis  Histologic grading system: 3 grade system    07/08/2020 -  Chemotherapy    Patient is on Treatment Plan: BREAST PACLITAXEL + TRASTUZUMAB Q7D / TRASTUZUMAB Q21D         Observations/Objective:     Assessment Plan:  Malignant neoplasm of lower-outer quadrant of right breast of female, estrogen receptor positive ([place]) 05/03/2020:Screening mammogram showed a 1.0cm mass at the 6 o'clock position in the right breast. Diagnostic mammogram and US showed the 1.2cm mass at the 6 o'clock position in  the right breast. Biopsy showed IDC, grade 3, HER-2 positive (3+), ER 15% weak, PR-, Ki67 80%.   06/02/2020:Right lumpectomy ([facility]): invasive and in situ ductal carcinoma, 1.3cm, clear margins, 1 right  axillary lymph node negative for carcinoma. ER 15% weak, PR-,HER-2 positive (3+) Ki67 80%.   Treatment plan: 1.  Adjuvant chemotherapy with Taxol Herceptin followed by Herceptin maintenance. 2.  Adjuvant radiation therapy 3.  Adjuvant antiestrogen therapy ----------------------------------------------------------------------------------------------------- Treatment plan: Patient would like to resume chemotherapy once again.  We discussed lowering the dosage of Taxol and see if she can tolerate it any better. We will cancel radiation treatments and will initiate Taxol and Herceptin weekly starting tomorrow. ECHO 05/20/20: EF 60-65%   Return to clinic weekly for chemo and every other week for follow-up with me.  I discussed the assessment and treatment plan with the patient. The patient was provided an opportunity to ask questions and all were answered. The patient agreed with the plan and demonstrated an understanding of the instructions. The patient was advised to call back or seek an in-person evaluation if the symptoms worsen or if the condition fails to improve as anticipated.   Total time spent: 21 mins including non-face to face time and time spent for planning, charting and coordination of care  Pamela Eisenmenger, MD 08/11/2020    I, Pamela [name], am acting as scribe for Pamela Lose, MD.  I have reviewed the above documentation for accuracy and completeness, and I agree with the above.

## 2020-08-11 ENCOUNTER — Encounter: Payer: Self-pay | Admitting: *Deleted

## 2020-08-11 ENCOUNTER — Other Ambulatory Visit: Payer: Self-pay | Admitting: Pharmacist

## 2020-08-11 ENCOUNTER — Inpatient Hospital Stay (HOSPITAL_BASED_OUTPATIENT_CLINIC_OR_DEPARTMENT_OTHER): Payer: 59 | Admitting: Hematology and Oncology

## 2020-08-11 DIAGNOSIS — C50511 Malignant neoplasm of lower-outer quadrant of right female breast: Secondary | ICD-10-CM | POA: Diagnosis not present

## 2020-08-11 DIAGNOSIS — Z170 Estrogen receptor positive status [ER+]: Secondary | ICD-10-CM | POA: Diagnosis not present

## 2020-08-12 ENCOUNTER — Other Ambulatory Visit: Payer: 59

## 2020-08-12 ENCOUNTER — Ambulatory Visit: Payer: 59

## 2020-08-12 ENCOUNTER — Other Ambulatory Visit: Payer: Self-pay

## 2020-08-12 ENCOUNTER — Inpatient Hospital Stay: Payer: 59

## 2020-08-12 VITALS — BP 131/63 | HR 76 | Temp 98.0°F | Resp 18 | Wt 334.7 lb

## 2020-08-12 DIAGNOSIS — C50511 Malignant neoplasm of lower-outer quadrant of right female breast: Secondary | ICD-10-CM

## 2020-08-12 DIAGNOSIS — Z5112 Encounter for antineoplastic immunotherapy: Secondary | ICD-10-CM | POA: Diagnosis not present

## 2020-08-12 DIAGNOSIS — Z170 Estrogen receptor positive status [ER+]: Secondary | ICD-10-CM

## 2020-08-12 DIAGNOSIS — Z95828 Presence of other vascular implants and grafts: Secondary | ICD-10-CM

## 2020-08-12 LAB — CBC WITH DIFFERENTIAL (CANCER CENTER ONLY)
Abs Immature Granulocytes: 0.05 10*3/uL (ref 0.00–0.07)
Basophils Absolute: 0 10*3/uL (ref 0.0–0.1)
Basophils Relative: 1 %
Eosinophils Absolute: 0.2 10*3/uL (ref 0.0–0.5)
Eosinophils Relative: 3 %
HCT: 32.9 % — ABNORMAL LOW (ref 36.0–46.0)
Hemoglobin: 10.6 g/dL — ABNORMAL LOW (ref 12.0–15.0)
Immature Granulocytes: 1 %
Lymphocytes Relative: 23 %
Lymphs Abs: 1.5 10*3/uL (ref 0.7–4.0)
MCH: 26.3 pg (ref 26.0–34.0)
MCHC: 32.2 g/dL (ref 30.0–36.0)
MCV: 81.6 fL (ref 80.0–100.0)
Monocytes Absolute: 0.5 10*3/uL (ref 0.1–1.0)
Monocytes Relative: 8 %
Neutro Abs: 4.2 10*3/uL (ref 1.7–7.7)
Neutrophils Relative %: 64 %
Platelet Count: 221 10*3/uL (ref 150–400)
RBC: 4.03 MIL/uL (ref 3.87–5.11)
RDW: 15 % (ref 11.5–15.5)
WBC Count: 6.4 10*3/uL (ref 4.0–10.5)
nRBC: 0 % (ref 0.0–0.2)

## 2020-08-12 LAB — CMP (CANCER CENTER ONLY)
ALT: 15 U/L (ref 0–44)
AST: 15 U/L (ref 15–41)
Albumin: 3.9 g/dL (ref 3.5–5.0)
Alkaline Phosphatase: 66 U/L (ref 38–126)
Anion gap: 10 (ref 5–15)
BUN: 12 mg/dL (ref 6–20)
CO2: 30 mmol/L (ref 22–32)
Calcium: 10 mg/dL (ref 8.9–10.3)
Chloride: 101 mmol/L (ref 98–111)
Creatinine: 0.85 mg/dL (ref 0.44–1.00)
GFR, Estimated: >60 mL/min (ref >60)
Glucose, Bld: 139 mg/dL — ABNORMAL HIGH (ref 70–99)
Potassium: 3.4 mmol/L — ABNORMAL LOW (ref 3.5–5.1)
Sodium: 141 mmol/L (ref 135–145)
Total Bilirubin: 1.1 mg/dL (ref 0.3–1.2)
Total Protein: 7.3 g/dL (ref 6.5–8.1)

## 2020-08-12 MED ORDER — HEPARIN SOD (PORK) LOCK FLUSH 100 UNIT/ML IV SOLN
500.0000 [IU] | Freq: Once | INTRAVENOUS | Status: AC | PRN
  Administered 2020-08-12: 500 [IU]
  Filled 2020-08-12: qty 5

## 2020-08-12 MED ORDER — SODIUM CHLORIDE 0.9% FLUSH
10.0000 mL | Freq: Once | INTRAVENOUS | Status: AC
  Administered 2020-08-12: 10 mL
  Filled 2020-08-12: qty 10

## 2020-08-12 MED ORDER — SODIUM CHLORIDE 0.9% FLUSH
10.0000 mL | INTRAVENOUS | Status: DC | PRN
  Administered 2020-08-12: 10 mL
  Filled 2020-08-12: qty 10

## 2020-08-12 MED ORDER — DIPHENHYDRAMINE HCL 50 MG/ML IJ SOLN
INTRAMUSCULAR | Status: AC
  Filled 2020-08-12: qty 1

## 2020-08-12 MED ORDER — DIPHENHYDRAMINE HCL 50 MG/ML IJ SOLN
25.0000 mg | Freq: Once | INTRAMUSCULAR | Status: AC
  Administered 2020-08-12: 25 mg via INTRAVENOUS

## 2020-08-12 MED ORDER — SODIUM CHLORIDE 0.9 % IV SOLN
Freq: Once | INTRAVENOUS | Status: AC
Start: 2020-08-12 — End: 2020-08-12
  Filled 2020-08-12: qty 250

## 2020-08-12 MED ORDER — DEXAMETHASONE SODIUM PHOSPHATE 100 MG/10ML IJ SOLN
10.0000 mg | Freq: Once | INTRAMUSCULAR | Status: AC
  Administered 2020-08-12: 10 mg via INTRAVENOUS
  Filled 2020-08-12: qty 10

## 2020-08-12 MED ORDER — SODIUM CHLORIDE 0.9 % IV SOLN
50.0000 mg/m2 | Freq: Once | INTRAVENOUS | Status: AC
  Administered 2020-08-12: 138 mg via INTRAVENOUS
  Filled 2020-08-12: qty 23

## 2020-08-12 MED ORDER — FAMOTIDINE 20 MG IN NS 100 ML IVPB
INTRAVENOUS | Status: AC
  Filled 2020-08-12: qty 100

## 2020-08-12 MED ORDER — FAMOTIDINE 20 MG IN NS 100 ML IVPB
20.0000 mg | Freq: Once | INTRAVENOUS | Status: AC
  Administered 2020-08-12: 20 mg via INTRAVENOUS

## 2020-08-12 NOTE — Patient Instructions (Signed)
[facility]  Discharge Instructions: Thank you for choosing [facility] to provide your oncology and hematology care.   If you have a lab appointment with the [facility], please go directly to the [facility] and check in at the registration area.   Wear comfortable clothing and clothing appropriate for easy access to any Portacath or PICC line.   We strive to give you quality time with your provider. You may need to reschedule your appointment if you arrive late (15 or more minutes).  Arriving late affects you and other patients whose appointments are after yours.  Also, if you miss three or more appointments without notifying the office, you may be dismissed from the clinic at the provider's discretion.      For prescription refill requests, have your pharmacy contact our office and allow 72 hours for refills to be completed.    Today you received the following chemotherapy and/or immunotherapy agents Taxol    To help prevent nausea and vomiting after your treatment, we encourage you to take your nausea medication as directed.  BELOW ARE SYMPTOMS THAT SHOULD BE REPORTED IMMEDIATELY: *FEVER GREATER THAN 100.4 F (38 C) OR HIGHER *CHILLS OR SWEATING *NAUSEA AND VOMITING THAT IS NOT CONTROLLED WITH YOUR NAUSEA MEDICATION *UNUSUAL SHORTNESS OF BREATH *UNUSUAL BRUISING OR BLEEDING *URINARY PROBLEMS (pain or burning when urinating, or frequent urination) *BOWEL PROBLEMS (unusual diarrhea, constipation, pain near the anus) TENDERNESS IN MOUTH AND THROAT WITH OR WITHOUT PRESENCE OF ULCERS (sore throat, sores in mouth, or a toothache) UNUSUAL RASH, SWELLING OR PAIN  UNUSUAL VAGINAL DISCHARGE OR ITCHING   Items with * indicate a potential emergency and should be followed up as soon as possible or go to the Emergency Department if any problems should occur.  Please show the CHEMOTHERAPY ALERT CARD or IMMUNOTHERAPY ALERT CARD at check-in to the  Emergency Department and triage nurse.  Should you have questions after your visit or need to cancel or reschedule your appointment, please contact [facility]  Dept: [phone]  and follow the prompts.  Office hours are 8:00 a.m. to 4:30 p.m. Monday - Friday. Please note that voicemails left after 4:00 p.m. may not be returned until the following business day.  We are closed weekends and major holidays. You have access to a nurse at all times for urgent questions. Please call the main number to the clinic Dept: [phone] and follow the prompts.   For any non-urgent questions, you may also contact your provider using MyChart. We now offer e-Visits for anyone 32 and older to request care online for non-urgent symptoms. For details visit [email or web address].   Also download the MyChart app! Go to the app store, search "MyChart", open the app, select [facility], and log in with your MyChart username and password.  Due to Covid, a mask is required upon entering the hospital/clinic. If you do not have a mask, one will be given to you upon arrival. For doctor visits, patients may have 1 support person aged 35 or older with them. For treatment visits, patients cannot have anyone with them due to current Covid guidelines and our immunocompromised population.

## 2020-08-15 ENCOUNTER — Ambulatory Visit: Payer: 59

## 2020-08-15 ENCOUNTER — Ambulatory Visit: Payer: 59 | Admitting: Radiation Oncology

## 2020-08-16 ENCOUNTER — Ambulatory Visit: Payer: 59

## 2020-08-17 ENCOUNTER — Ambulatory Visit: Payer: 59

## 2020-08-18 ENCOUNTER — Other Ambulatory Visit: Payer: Self-pay

## 2020-08-18 ENCOUNTER — Ambulatory Visit: Payer: 59

## 2020-08-18 DIAGNOSIS — C50511 Malignant neoplasm of lower-outer quadrant of right female breast: Secondary | ICD-10-CM

## 2020-08-18 DIAGNOSIS — Z170 Estrogen receptor positive status [ER+]: Secondary | ICD-10-CM

## 2020-08-18 NOTE — Progress Notes (Signed)
Patient Care Team: [name], MD as PCP - General (Internal Medicine) Mcarthur Rossetti, MD as Consulting Physician (Orthopedic Surgery) Mauro Kaufmann, RN as Oncology Nurse Navigator Rockwell Germany, RN as Oncology Nurse Navigator [name], MD as Consulting Physician (General Surgery) Nicholas Lose, MD as Consulting Physician (Hematology and Oncology) Eppie Gibson, MD as Attending Physician (Radiation Oncology)  DIAGNOSIS:    ICD-10-CM   1. Malignant neoplasm of lower-outer quadrant of right breast of female, estrogen receptor positive ([place])  C50.511    Z17.0       SUMMARY OF ONCOLOGIC HISTORY: Oncology History  Malignant neoplasm of lower-outer quadrant of right breast of female, estrogen receptor positive ([place])  05/03/2020 Initial Diagnosis   Screening mammogram showed a 1.0cm mass at the 6 o'clock position in the right breast. Diagnostic mammogram and US showed the 1.2cm mass at the 6 o'clock position in the right breast. Biopsy showed IDC, grade 3, HER-2 positive (3+), ER 15% weak, PR-, Ki67 80%.   05/11/2020 Cancer Staging   Staging form: Breast, AJCC 8th Edition - Clinical stage from 05/11/2020: Stage IA (cT1b, cN0, cM0, G3, ER+, PR-, HER2+) - Signed by Nicholas Lose, MD on 05/11/2020  Stage prefix: Initial diagnosis    05/19/2020 Genetic Testing   Negative hereditary cancer genetic testing: no pathogenic variants detected in [facility] Breast STAT Panel and Common Hereditary Cancers Panel.  The report dates are May 19, 2020 (STAT) and May 23, 2020 (Common Hereditary).   The Common Hereditary Cancers Panel offered by [facility] includes sequencing and/or deletion duplication testing of the following 47 genes: APC, ATM, AXIN2, BARD1, BMPR1A, BRCA1, BRCA2, BRIP1, CDH1, CDK4, CDKN2A (p14ARF), CDKN2A (p16INK4a), CHEK2, CTNNA1, DICER1, EPCAM (Deletion/duplication testing only), GREM1 (promoter region deletion/duplication testing only), GREM1, HOXB13, KIT, MEN1, MLH1,  MSH2, MSH3, MSH6, MUTYH, NBN, NF1, NHTL1, PALB2, PDGFRA, PMS2, POLD1, POLE, PTEN, RAD50, RAD51C, RAD51D, SDHA, SDHB, SDHC, SDHD, SMAD4, SMARCA4. STK11, TP53, TSC1, TSC2, and VHL.  The following genes were evaluated for sequence changes only: SDHA and HOXB13 c.251G>A variant only.es only: SDHA and HOXB13 c.251G>A variant only.   06/02/2020 Surgery   Right lumpectomy ([facility]): invasive and in situ ductal carcinoma, 1.3cm, clear margins, 1 right axillary lymph node negative for carcinoma.   06/02/2020 Cancer Staging   Staging form: Breast, AJCC 8th Edition - Pathologic stage from 06/02/2020: Stage IA (pT1c, pN0, cM0, G3, ER+, PR-, HER2+) - Signed by Gardenia Phlegm, NP on 06/15/2020  Stage prefix: Initial diagnosis  Histologic grading system: 3 grade system    07/08/2020 - 08/05/2020 Chemotherapy          08/12/2020 -  Chemotherapy    Patient is on Treatment Plan: BREAST PACLITAXEL + TRASTUZUMAB Q7D / TRASTUZUMAB Q21D         CHIEF COMPLIANT: Cycle 3 Taxol Herceptin  INTERVAL HISTORY: Pamela [name] is a 55 y.o. with above-mentioned history of right breast cancer treated with lumpectomy, and is currently on Taxol and Herceptin maintenance. She reports to the clinic today for treatment.  Considering the first treatment of chemo was extremely bad for her, she did extremely well with the last cycle of chemo.  It was at a lower dosage.  Other than mild fatigue she did not experience any side effects.  Denies any nausea or vomiting.  ALLERGIES:  is allergic to pollen extract, latex, codeine, shellfish allergy, and betadine [povidone iodine].  MEDICATIONS:  Current Outpatient Medications  Medication Sig Dispense Refill   amLODipine (NORVASC) 10 MG tablet TAKE  1 TABLET BY MOUTH DAILY. 90 tablet 2   aspirin EC 81 MG tablet Take 1 tablet (81 mg total) by mouth daily. Swallow whole. 30 tablet 11   Azilsartan-Chlorthalidone (EDARBYCLOR) 40-25 MG TABS Take 1 tablet by mouth daily. 90  tablet 2   HYDROcodone-acetaminophen (NORCO) 5-325 MG tablet 1/2 to 1 tablet Q 4 hours prn pain 30 tablet 0   hydrOXYzine (ATARAX/VISTARIL) 25 MG tablet Take 1 tablet (25 mg total) by mouth every 6 (six) hours as needed for anxiety. 36 tablet 0   levonorgestrel (MIRENA, 52 MG,) 20 MCG/DAY IUD 1 each by Intrauterine route once.     lidocaine-prilocaine (EMLA) cream Apply 1 application topically once.     LORazepam (ATIVAN) 0.5 MG tablet Take 1 tablet 20 minutes before radiation therapy, PRN anxiety.  May take once daily at home the week before treatment to get used to effects. 30 tablet 0   rosuvastatin (CRESTOR) 40 MG tablet Take 1 tablet (40 mg total) by mouth daily. 90 tablet 3   Vitamin D, Ergocalciferol, (DRISDOL) 1.25 MG (50000 UNIT) CAPS capsule Take 1 capsule (50,000 Units total) by mouth every 7 (seven) days. 24 capsule 1   No current facility-administered medications for this visit.    PHYSICAL EXAMINATION: ECOG PERFORMANCE STATUS: 1 - Symptomatic but completely ambulatory  Vitals:   08/19/20 0907  BP: (!) 116/58  Pulse: (!) 56  Resp: 18  Temp: 97.6 F (36.4 C)  SpO2: 100%   Filed Weights   08/19/20 0907  Weight: (!) 335 lb 8 oz (152.2 kg)    LABORATORY DATA:  I have reviewed the data as listed CMP Latest Ref Rng & Units 08/12/2020 08/05/2020 07/29/2020  Glucose 70 - 99 mg/dL 139(H) 114(H) 130(H)  BUN 6 - 20 mg/dL 12 10 12   Creatinine 0.44 - 1.00 mg/dL 0.85 0.83 0.89  Sodium 135 - 145 mmol/L 141 140 142  Potassium 3.5 - 5.1 mmol/L 3.4(L) 3.3(L) 3.5  Chloride 98 - 111 mmol/L 101 101 103  CO2 22 - 32 mmol/L 30 29 28   Calcium 8.9 - 10.3 mg/dL 10.0 9.8 9.7  Total Protein 6.5 - 8.1 g/dL 7.3 7.3 7.2  Total Bilirubin 0.3 - 1.2 mg/dL 1.1 0.7 0.7  Alkaline Phos 38 - 126 U/L 66 65 63  AST 15 - 41 U/L 15 15 17   ALT 0 - 44 U/L 15 15 16     Lab Results  Component Value Date   WBC 6.2 08/19/2020   HGB 10.3 (L) 08/19/2020   HCT 31.8 (L) 08/19/2020   MCV 81.7 08/19/2020   PLT  268 08/19/2020   NEUTROABS 3.4 08/19/2020    ASSESSMENT & PLAN:  Malignant neoplasm of lower-outer quadrant of right breast of female, estrogen receptor positive ([place]) 05/03/2020:Screening mammogram showed a 1.0cm mass at the 6 o'clock position in the right breast. Diagnostic mammogram and US showed the 1.2cm mass at the 6 o'clock position in the right breast. Biopsy showed IDC, grade 3, HER-2 positive (3+), ER 15% weak, PR-, Ki67 80%.   06/02/2020:Right lumpectomy ([facility]): invasive and in situ ductal carcinoma, 1.3cm, clear margins, 1 right axillary lymph node negative for carcinoma. ER 15% weak, PR-,HER-2 positive (3+) Ki67 80%.   Treatment plan: 1.  Adjuvant chemotherapy with Taxol Herceptin followed by Herceptin maintenance. 2.  Adjuvant radiation therapy 3.  Adjuvant antiestrogen therapy ----------------------------------------------------------------------------------------------------- Current Treatment:Cycle 3 Taxol-Herceptin ECHO 05/20/20: EF 60-65%   Chemo Toxicities:  Fatigue: We will start her on weekly B12 injections. Anemia: I discussed  with her about eating iron-containing foods.  RTC weekly with Taxol and every other week for follow up with me Return to clinic weekly for chemo and every other week for follow-up with me.      No orders of the defined types were placed in this encounter.  The patient has a good understanding of the overall plan. she agrees with it. she will call with any problems that may develop before the next visit here.  Total time spent: 30 mins including face to face time and time spent for planning, charting and coordination of care  Rulon Eisenmenger, MD, MPH 08/19/2020  I, Thana Ates, am acting as scribe for Dr. Nicholas Lose.  I have reviewed the above documentation for accuracy and completeness, and I agree with the above.

## 2020-08-18 NOTE — Assessment & Plan Note (Signed)
05/03/2020:Screening mammogram showed a 1.0cm mass at the 6 o'clock position in the right breast. Diagnostic mammogram and US showed the 1.2cm mass at the 6 o'clock position in the right breast. Biopsy showed IDC, grade 3, HER-2 positive (3+), ER 15% weak, PR-, Ki67 80%.  06/02/2020:Right lumpectomy ([facility]): invasive and in situ ductal carcinoma, 1.3cm, clear margins, 1 right axillary lymph node negative for carcinoma. ER 15% weak, PR-,HER-2 positive (3+) Ki67 80%.  Treatment plan: 1.Adjuvant chemotherapy with Taxol Herceptinfollowed byHerceptin maintenance. 2.Adjuvant radiation therapy 3.Adjuvant antiestrogen therapy ----------------------------------------------------------------------------------------------------- Current Treatment:Cycle 3 Taxol-Herceptin ECHO 05/20/20: EF 60-65%   Chemo Toxicities:  RTC weekly with Taxol and every other week for follow up with me Return to clinic weekly for chemo and every other week for follow-up with me.  

## 2020-08-19 ENCOUNTER — Ambulatory Visit: Payer: 59

## 2020-08-19 ENCOUNTER — Inpatient Hospital Stay (HOSPITAL_BASED_OUTPATIENT_CLINIC_OR_DEPARTMENT_OTHER): Payer: 59 | Admitting: Hematology and Oncology

## 2020-08-19 ENCOUNTER — Inpatient Hospital Stay: Payer: 59

## 2020-08-19 ENCOUNTER — Other Ambulatory Visit: Payer: Self-pay

## 2020-08-19 DIAGNOSIS — Z95828 Presence of other vascular implants and grafts: Secondary | ICD-10-CM

## 2020-08-19 DIAGNOSIS — Z170 Estrogen receptor positive status [ER+]: Secondary | ICD-10-CM | POA: Diagnosis not present

## 2020-08-19 DIAGNOSIS — C50511 Malignant neoplasm of lower-outer quadrant of right female breast: Secondary | ICD-10-CM

## 2020-08-19 DIAGNOSIS — Z5112 Encounter for antineoplastic immunotherapy: Secondary | ICD-10-CM | POA: Diagnosis not present

## 2020-08-19 LAB — CBC WITH DIFFERENTIAL (CANCER CENTER ONLY)
Abs Immature Granulocytes: 0.07 10*3/uL (ref 0.00–0.07)
Basophils Absolute: 0.1 10*3/uL (ref 0.0–0.1)
Basophils Relative: 1 %
Eosinophils Absolute: 0.2 10*3/uL (ref 0.0–0.5)
Eosinophils Relative: 4 %
HCT: 31.8 % — ABNORMAL LOW (ref 36.0–46.0)
Hemoglobin: 10.3 g/dL — ABNORMAL LOW (ref 12.0–15.0)
Immature Granulocytes: 1 %
Lymphocytes Relative: 32 %
Lymphs Abs: 2 10*3/uL (ref 0.7–4.0)
MCH: 26.5 pg (ref 26.0–34.0)
MCHC: 32.4 g/dL (ref 30.0–36.0)
MCV: 81.7 fL (ref 80.0–100.0)
Monocytes Absolute: 0.4 10*3/uL (ref 0.1–1.0)
Monocytes Relative: 7 %
Neutro Abs: 3.4 10*3/uL (ref 1.7–7.7)
Neutrophils Relative %: 55 %
Platelet Count: 268 10*3/uL (ref 150–400)
RBC: 3.89 MIL/uL (ref 3.87–5.11)
RDW: 15.5 % (ref 11.5–15.5)
WBC Count: 6.2 10*3/uL (ref 4.0–10.5)
nRBC: 0 % (ref 0.0–0.2)

## 2020-08-19 LAB — CMP (CANCER CENTER ONLY)
ALT: 18 U/L (ref 0–44)
AST: 17 U/L (ref 15–41)
Albumin: 4 g/dL (ref 3.5–5.0)
Alkaline Phosphatase: 57 U/L (ref 38–126)
Anion gap: 9 (ref 5–15)
BUN: 17 mg/dL (ref 6–20)
CO2: 29 mmol/L (ref 22–32)
Calcium: 9.2 mg/dL (ref 8.9–10.3)
Chloride: 100 mmol/L (ref 98–111)
Creatinine: 0.97 mg/dL (ref 0.44–1.00)
GFR, Estimated: >60 mL/min (ref >60)
Glucose, Bld: 110 mg/dL — ABNORMAL HIGH (ref 70–99)
Potassium: 3.6 mmol/L (ref 3.5–5.1)
Sodium: 138 mmol/L (ref 135–145)
Total Bilirubin: 0.8 mg/dL (ref 0.3–1.2)
Total Protein: 6.9 g/dL (ref 6.5–8.1)

## 2020-08-19 MED ORDER — HEPARIN SOD (PORK) LOCK FLUSH 100 UNIT/ML IV SOLN
500.0000 [IU] | Freq: Once | INTRAVENOUS | Status: AC | PRN
  Administered 2020-08-19: 500 [IU]
  Filled 2020-08-19: qty 5

## 2020-08-19 MED ORDER — SODIUM CHLORIDE 0.9 % IV SOLN
10.0000 mg | Freq: Once | INTRAVENOUS | Status: AC
  Administered 2020-08-19: 10 mg via INTRAVENOUS
  Filled 2020-08-19: qty 10

## 2020-08-19 MED ORDER — SODIUM CHLORIDE 0.9 % IV SOLN
50.0000 mg/m2 | Freq: Once | INTRAVENOUS | Status: AC
  Administered 2020-08-19: 138 mg via INTRAVENOUS
  Filled 2020-08-19: qty 23

## 2020-08-19 MED ORDER — DIPHENHYDRAMINE HCL 50 MG/ML IJ SOLN
INTRAMUSCULAR | Status: AC
  Filled 2020-08-19: qty 1

## 2020-08-19 MED ORDER — SODIUM CHLORIDE 0.9% FLUSH
10.0000 mL | INTRAVENOUS | Status: DC | PRN
  Administered 2020-08-19: 10 mL
  Filled 2020-08-19: qty 10

## 2020-08-19 MED ORDER — SODIUM CHLORIDE 0.9% FLUSH
10.0000 mL | Freq: Once | INTRAVENOUS | Status: AC
Start: 2020-08-19 — End: 2020-08-19
  Administered 2020-08-19: 10 mL
  Filled 2020-08-19: qty 10

## 2020-08-19 MED ORDER — DIPHENHYDRAMINE HCL 50 MG/ML IJ SOLN
25.0000 mg | Freq: Once | INTRAMUSCULAR | Status: AC
Start: 2020-08-19 — End: 2020-08-19
  Administered 2020-08-19: 25 mg via INTRAVENOUS

## 2020-08-19 MED ORDER — FAMOTIDINE 20 MG IN NS 100 ML IVPB
INTRAVENOUS | Status: AC
  Filled 2020-08-19: qty 100

## 2020-08-19 MED ORDER — CYANOCOBALAMIN 1000 MCG/ML IJ SOLN
1000.0000 ug | Freq: Once | INTRAMUSCULAR | Status: AC
  Administered 2020-08-19: 1000 ug via INTRAMUSCULAR

## 2020-08-19 MED ORDER — FAMOTIDINE 20 MG IN NS 100 ML IVPB
20.0000 mg | Freq: Once | INTRAVENOUS | Status: AC
  Administered 2020-08-19: 20 mg via INTRAVENOUS

## 2020-08-19 MED ORDER — CYANOCOBALAMIN 1000 MCG/ML IJ SOLN
INTRAMUSCULAR | Status: AC
  Filled 2020-08-19: qty 1

## 2020-08-19 MED ORDER — SODIUM CHLORIDE 0.9 % IV SOLN
Freq: Once | INTRAVENOUS | Status: AC
Start: 2020-08-19 — End: 2020-08-19
  Filled 2020-08-19: qty 250

## 2020-08-19 NOTE — Patient Instructions (Signed)
[facility]COL[facility]   Discharge Instructions: Thank you for choosing [facility] to provide your oncology and hematology care.   If you have a lab appointment with the Cancer Center, please go directly to the Cancer Center and check in at the registration area.   Wear comfortable clothing and clothing appropriate for easy access to any Portacath or PICC line.   We strive to give you quality time with your provider. You may need to reschedule your appointment if you arrive late (15 or more minutes).  Arriving late affects you and other patients whose appointments are after yours.  Also, if you miss three or more appointments without notifying the office, you may be dismissed from the clinic at the provider's discretion.      For prescription refill requests, have your pharmacy contact our office and allow 72 hours for refills to be completed.    Today you received the following chemotherapy and/or immunotherapy agents: paclitaxel.      To help prevent nausea and vomiting after your treatment, we encourage you to take your nausea medication as directed.  BELOW ARE SYMPTOMS THAT SHOULD BE REPORTED IMMEDIATELY: *FEVER GREATER THAN 100.4 F (38 C) OR HIGHER *CHILLS OR SWEATING *NAUSEA AND VOMITING THAT IS NOT CONTROLLED WITH YOUR NAUSEA MEDICATION *UNUSUAL SHORTNESS OF BREATH *UNUSUAL BRUISING OR BLEEDING *URINARY PROBLEMS (pain or burning when urinating, or frequent urination) *BOWEL PROBLEMS (unusual diarrhea, constipation, pain near the anus) TENDERNESS IN MOUTH AND THROAT WITH OR WITHOUT PRESENCE OF ULCERS (sore throat, sores in mouth, or a toothache) UNUSUAL RASH, SWELLING OR PAIN  UNUSUAL VAGINAL DISCHARGE OR ITCHING   Items with * indicate a potential emergency and should be followed up as soon as possible or go to the Emergency Department if any problems should occur.  Please show the CHEMOTHERAPY ALERT CARD or IMMUNOTHERAPY ALERT CARD at check-in  to the Emergency Department and triage nurse.  Should you have questions after your visit or need to cancel or reschedule your appointment, please contact [facility]  Dept: [phone]  and follow the prompts.  Office hours are 8:00 a.m. to 4:30 p.m. Monday - Friday. Please note that voicemails left after 4:00 p.m. may not be returned until the following business day.  We are closed weekends and major holidays. You have access to a nurse at all times for urgent questions. Please call the main number to the clinic Dept: [phone] and follow the prompts.   For any non-urgent questions, you may also contact your provider using MyChart. We now offer e-Visits for anyone 18 and older to request care online for non-urgent symptoms. For details visit [email or web address].   Also download the MyChart app! Go to the app store, search "MyChart", open the app, select [facility], and log in with your MyChart username and password.  Due to Covid, a mask is required upon entering the hospital/clinic. If you do not have a mask, one will be given to you upon arrival. For doctor visits, patients may have 1 support person aged 18 or older with them. For treatment visits, patients cannot have anyone with them due to current Covid guidelines and our immunocompromised population.   

## 2020-08-22 ENCOUNTER — Ambulatory Visit: Payer: 59

## 2020-08-23 ENCOUNTER — Ambulatory Visit: Payer: 59

## 2020-08-24 ENCOUNTER — Ambulatory Visit: Payer: 59

## 2020-08-25 ENCOUNTER — Ambulatory Visit: Payer: 59

## 2020-08-26 ENCOUNTER — Other Ambulatory Visit: Payer: Self-pay

## 2020-08-26 ENCOUNTER — Ambulatory Visit: Payer: 59

## 2020-08-26 ENCOUNTER — Inpatient Hospital Stay: Payer: 59

## 2020-08-26 VITALS — BP 147/67 | HR 60 | Temp 98.4°F | Resp 16 | Wt 336.0 lb

## 2020-08-26 DIAGNOSIS — Z170 Estrogen receptor positive status [ER+]: Secondary | ICD-10-CM

## 2020-08-26 DIAGNOSIS — Z95828 Presence of other vascular implants and grafts: Secondary | ICD-10-CM

## 2020-08-26 DIAGNOSIS — C50511 Malignant neoplasm of lower-outer quadrant of right female breast: Secondary | ICD-10-CM

## 2020-08-26 DIAGNOSIS — Z5112 Encounter for antineoplastic immunotherapy: Secondary | ICD-10-CM | POA: Diagnosis not present

## 2020-08-26 LAB — CMP (CANCER CENTER ONLY)
ALT: 14 U/L (ref 0–44)
AST: 13 U/L — ABNORMAL LOW (ref 15–41)
Albumin: 3.4 g/dL — ABNORMAL LOW (ref 3.5–5.0)
Alkaline Phosphatase: 63 U/L (ref 38–126)
Anion gap: 8 (ref 5–15)
BUN: 9 mg/dL (ref 6–20)
CO2: 28 mmol/L (ref 22–32)
Calcium: 8.8 mg/dL — ABNORMAL LOW (ref 8.9–10.3)
Chloride: 105 mmol/L (ref 98–111)
Creatinine: 0.79 mg/dL (ref 0.44–1.00)
GFR, Estimated: >60 mL/min (ref >60)
Glucose, Bld: 97 mg/dL (ref 70–99)
Potassium: 3.8 mmol/L (ref 3.5–5.1)
Sodium: 141 mmol/L (ref 135–145)
Total Bilirubin: 0.6 mg/dL (ref 0.3–1.2)
Total Protein: 6.7 g/dL (ref 6.5–8.1)

## 2020-08-26 LAB — CBC WITH DIFFERENTIAL (CANCER CENTER ONLY)
Abs Immature Granulocytes: 0.09 10*3/uL — ABNORMAL HIGH (ref 0.00–0.07)
Basophils Absolute: 0 10*3/uL (ref 0.0–0.1)
Basophils Relative: 1 %
Eosinophils Absolute: 0.1 10*3/uL (ref 0.0–0.5)
Eosinophils Relative: 3 %
HCT: 32.1 % — ABNORMAL LOW (ref 36.0–46.0)
Hemoglobin: 10.3 g/dL — ABNORMAL LOW (ref 12.0–15.0)
Immature Granulocytes: 2 %
Lymphocytes Relative: 29 %
Lymphs Abs: 1.5 10*3/uL (ref 0.7–4.0)
MCH: 26.5 pg (ref 26.0–34.0)
MCHC: 32.1 g/dL (ref 30.0–36.0)
MCV: 82.5 fL (ref 80.0–100.0)
Monocytes Absolute: 0.4 10*3/uL (ref 0.1–1.0)
Monocytes Relative: 7 %
Neutro Abs: 3.2 10*3/uL (ref 1.7–7.7)
Neutrophils Relative %: 58 %
Platelet Count: 268 10*3/uL (ref 150–400)
RBC: 3.89 MIL/uL (ref 3.87–5.11)
RDW: 16 % — ABNORMAL HIGH (ref 11.5–15.5)
WBC Count: 5.4 10*3/uL (ref 4.0–10.5)
nRBC: 0 % (ref 0.0–0.2)

## 2020-08-26 MED ORDER — SODIUM CHLORIDE 0.9% FLUSH
10.0000 mL | Freq: Once | INTRAVENOUS | Status: AC
  Administered 2020-08-26: 10 mL
  Filled 2020-08-26: qty 10

## 2020-08-26 MED ORDER — CYANOCOBALAMIN 1000 MCG/ML IJ SOLN
INTRAMUSCULAR | Status: AC
  Filled 2020-08-26: qty 1

## 2020-08-26 MED ORDER — ACETAMINOPHEN 325 MG PO TABS
ORAL_TABLET | ORAL | Status: AC
  Filled 2020-08-26: qty 2

## 2020-08-26 MED ORDER — SODIUM CHLORIDE 0.9% FLUSH
10.0000 mL | INTRAVENOUS | Status: DC | PRN
  Administered 2020-08-26: 10 mL
  Filled 2020-08-26: qty 10

## 2020-08-26 MED ORDER — SODIUM CHLORIDE 0.9 % IV SOLN
50.0000 mg/m2 | Freq: Once | INTRAVENOUS | Status: AC
  Administered 2020-08-26: 138 mg via INTRAVENOUS
  Filled 2020-08-26: qty 23

## 2020-08-26 MED ORDER — FAMOTIDINE 20 MG IN NS 100 ML IVPB
20.0000 mg | Freq: Once | INTRAVENOUS | Status: AC
  Administered 2020-08-26: 20 mg via INTRAVENOUS

## 2020-08-26 MED ORDER — CYANOCOBALAMIN 1000 MCG/ML IJ SOLN
1000.0000 ug | Freq: Once | INTRAMUSCULAR | Status: AC
  Administered 2020-08-26: 1000 ug via INTRAMUSCULAR

## 2020-08-26 MED ORDER — ACETAMINOPHEN 325 MG PO TABS
650.0000 mg | ORAL_TABLET | Freq: Once | ORAL | Status: AC
  Administered 2020-08-26: 650 mg via ORAL

## 2020-08-26 MED ORDER — DIPHENHYDRAMINE HCL 50 MG/ML IJ SOLN
25.0000 mg | Freq: Once | INTRAMUSCULAR | Status: AC
  Administered 2020-08-26: 25 mg via INTRAVENOUS

## 2020-08-26 MED ORDER — HEPARIN SOD (PORK) LOCK FLUSH 100 UNIT/ML IV SOLN
500.0000 [IU] | Freq: Once | INTRAVENOUS | Status: AC | PRN
  Administered 2020-08-26: 500 [IU]
  Filled 2020-08-26: qty 5

## 2020-08-26 MED ORDER — FAMOTIDINE 20 MG IN NS 100 ML IVPB
INTRAVENOUS | Status: AC
  Filled 2020-08-26: qty 100

## 2020-08-26 MED ORDER — TRASTUZUMAB-ANNS CHEMO 150 MG IV SOLR
2.0000 mg/kg | Freq: Once | INTRAVENOUS | Status: AC
  Administered 2020-08-26: 315 mg via INTRAVENOUS
  Filled 2020-08-26: qty 15

## 2020-08-26 MED ORDER — SODIUM CHLORIDE 0.9 % IV SOLN
Freq: Once | INTRAVENOUS | Status: AC
  Filled 2020-08-26: qty 250

## 2020-08-26 MED ORDER — DIPHENHYDRAMINE HCL 50 MG/ML IJ SOLN
INTRAMUSCULAR | Status: AC
  Filled 2020-08-26: qty 1

## 2020-08-26 MED ORDER — SODIUM CHLORIDE 0.9 % IV SOLN
10.0000 mg | Freq: Once | INTRAVENOUS | Status: AC
  Administered 2020-08-26: 10 mg via INTRAVENOUS
  Filled 2020-08-26: qty 10

## 2020-08-26 NOTE — Patient Instructions (Signed)
[facility]   Discharge Instructions: Thank you for choosing [facility] to provide your oncology and hematology care.   If you have a lab appointment with the [facility], please go directly to the [facility] and check in at the registration area.   Wear comfortable clothing and clothing appropriate for easy access to any Portacath or PICC line.   We strive to give you quality time with your provider. You may need to reschedule your appointment if you arrive late (15 or more minutes).  Arriving late affects you and other patients whose appointments are after yours.  Also, if you miss three or more appointments without notifying the office, you may be dismissed from the clinic at the provider's discretion.      For prescription refill requests, have your pharmacy contact our office and allow 72 hours for refills to be completed.    Today you received the following chemotherapy and/or immunotherapy agents: trastuzumab/paclitaxel.      To help prevent nausea and vomiting after your treatment, we encourage you to take your nausea medication as directed.  BELOW ARE SYMPTOMS THAT SHOULD BE REPORTED IMMEDIATELY: *FEVER GREATER THAN 100.4 F (38 C) OR HIGHER *CHILLS OR SWEATING *NAUSEA AND VOMITING THAT IS NOT CONTROLLED WITH YOUR NAUSEA MEDICATION *UNUSUAL SHORTNESS OF BREATH *UNUSUAL BRUISING OR BLEEDING *URINARY PROBLEMS (pain or burning when urinating, or frequent urination) *BOWEL PROBLEMS (unusual diarrhea, constipation, pain near the anus) TENDERNESS IN MOUTH AND THROAT WITH OR WITHOUT PRESENCE OF ULCERS (sore throat, sores in mouth, or a toothache) UNUSUAL RASH, SWELLING OR PAIN  UNUSUAL VAGINAL DISCHARGE OR ITCHING   Items with * indicate a potential emergency and should be followed up as soon as possible or go to the Emergency Department if any problems should occur.  Please show the CHEMOTHERAPY ALERT CARD or IMMUNOTHERAPY ALERT CARD  at check-in to the Emergency Department and triage nurse.  Should you have questions after your visit or need to cancel or reschedule your appointment, please contact [facility]  Dept: [phone]  and follow the prompts.  Office hours are 8:00 a.m. to 4:30 p.m. Monday - Friday. Please note that voicemails left after 4:00 p.m. may not be returned until the following business day.  We are closed weekends and major holidays. You have access to a nurse at all times for urgent questions. Please call the main number to the clinic Dept: [phone] and follow the prompts.   For any non-urgent questions, you may also contact your provider using MyChart. We now offer e-Visits for anyone 72 and older to request care online for non-urgent symptoms. For details visit [email or web address].   Also download the MyChart app! Go to the app store, search "MyChart", open the app, select [facility], and log in with your MyChart username and password.  Due to Covid, a mask is required upon entering the hospital/clinic. If you do not have a mask, one will be given to you upon arrival. For doctor visits, patients may have 1 support person aged 42 or older with them. For treatment visits, patients cannot have anyone with them due to current Covid guidelines and our immunocompromised population.

## 2020-08-26 NOTE — Progress Notes (Signed)
Per Dr. Alvy Bimler, may release treatment while waiting on CMP.

## 2020-08-29 ENCOUNTER — Ambulatory Visit: Payer: 59 | Admitting: Radiation Oncology

## 2020-08-29 ENCOUNTER — Ambulatory Visit: Payer: 59 | Attending: Surgery

## 2020-08-29 ENCOUNTER — Ambulatory Visit: Payer: 59

## 2020-08-30 ENCOUNTER — Ambulatory Visit (HOSPITAL_COMMUNITY)
Admission: RE | Admit: 2020-08-30 | Discharge: 2020-08-30 | Disposition: A | Discharge status: Home / Self Care | Payer: 59 | Source: Ambulatory Visit | Attending: Hematology and Oncology | Admitting: Hematology and Oncology

## 2020-08-30 ENCOUNTER — Ambulatory Visit: Payer: 59

## 2020-08-30 ENCOUNTER — Telehealth: Payer: Self-pay | Admitting: *Deleted

## 2020-08-30 ENCOUNTER — Other Ambulatory Visit: Payer: Self-pay

## 2020-08-30 DIAGNOSIS — Z170 Estrogen receptor positive status [ER+]: Secondary | ICD-10-CM | POA: Diagnosis not present

## 2020-08-30 DIAGNOSIS — Z0189 Encounter for other specified special examinations: Secondary | ICD-10-CM

## 2020-08-30 DIAGNOSIS — Z01818 Encounter for other preprocedural examination: Secondary | ICD-10-CM | POA: Insufficient documentation

## 2020-08-30 DIAGNOSIS — C50511 Malignant neoplasm of lower-outer quadrant of right female breast: Secondary | ICD-10-CM | POA: Insufficient documentation

## 2020-08-30 LAB — ECHOCARDIOGRAM COMPLETE
Area-P 1/2: 3.25 cm2
S' Lateral: 3 cm

## 2020-08-30 NOTE — Telephone Encounter (Signed)
Received call from pt with complaint of numbness and tingling in bilateral fingers and toes x several days.  Pt scheduled to see MD this week.  MD states he will assess pt during visit this week.  Pt verbalized understanding.

## 2020-08-30 NOTE — Progress Notes (Signed)
  Echocardiogram 2D Echocardiogram with strain has been performed.  Pamela [name] M 08/30/2020, 12:59 PM

## 2020-08-31 ENCOUNTER — Ambulatory Visit: Payer: 59

## 2020-09-01 ENCOUNTER — Ambulatory Visit: Payer: 59

## 2020-09-01 NOTE — Progress Notes (Signed)
Patient Care Team: [name], MD as PCP - General (Internal Medicine) Mcarthur Rossetti, MD as Consulting Physician (Orthopedic Surgery) Mauro Kaufmann, RN as Oncology Nurse Navigator Rockwell Germany, RN as Oncology Nurse Navigator [name], MD as Consulting Physician (General Surgery) Nicholas Lose, MD as Consulting Physician (Hematology and Oncology) Eppie Gibson, MD as Attending Physician (Radiation Oncology)  DIAGNOSIS:    ICD-10-CM   1. Malignant neoplasm of lower-outer quadrant of right breast of female, estrogen receptor positive ([place])  C50.511    Z17.0       SUMMARY OF ONCOLOGIC HISTORY: Oncology History  Malignant neoplasm of lower-outer quadrant of right breast of female, estrogen receptor positive ([place])  05/03/2020 Initial Diagnosis   Screening mammogram showed a 1.0cm mass at the 6 o'clock position in the right breast. Diagnostic mammogram and US showed the 1.2cm mass at the 6 o'clock position in the right breast. Biopsy showed IDC, grade 3, HER-2 positive (3+), ER 15% weak, PR-, Ki67 80%.   05/11/2020 Cancer Staging   Staging form: Breast, AJCC 8th Edition - Clinical stage from 05/11/2020: Stage IA (cT1b, cN0, cM0, G3, ER+, PR-, HER2+) - Signed by Nicholas Lose, MD on 05/11/2020  Stage prefix: Initial diagnosis    05/19/2020 Genetic Testing   Negative hereditary cancer genetic testing: no pathogenic variants detected in [facility] Breast STAT Panel and Common Hereditary Cancers Panel.  The report dates are May 19, 2020 (STAT) and May 23, 2020 (Common Hereditary).   The Common Hereditary Cancers Panel offered by [facility] includes sequencing and/or deletion duplication testing of the following 47 genes: APC, ATM, AXIN2, BARD1, BMPR1A, BRCA1, BRCA2, BRIP1, CDH1, CDK4, CDKN2A (p14ARF), CDKN2A (p16INK4a), CHEK2, CTNNA1, DICER1, EPCAM (Deletion/duplication testing only), GREM1 (promoter region deletion/duplication testing only), GREM1, HOXB13, KIT, MEN1, MLH1,  MSH2, MSH3, MSH6, MUTYH, NBN, NF1, NHTL1, PALB2, PDGFRA, PMS2, POLD1, POLE, PTEN, RAD50, RAD51C, RAD51D, SDHA, SDHB, SDHC, SDHD, SMAD4, SMARCA4. STK11, TP53, TSC1, TSC2, and VHL.  The following genes were evaluated for sequence changes only: SDHA and HOXB13 c.251G>A variant only.es only: SDHA and HOXB13 c.251G>A variant only.   06/02/2020 Surgery   Right lumpectomy ([facility]): invasive and in situ ductal carcinoma, 1.3cm, clear margins, 1 right axillary lymph node negative for carcinoma.   06/02/2020 Cancer Staging   Staging form: Breast, AJCC 8th Edition - Pathologic stage from 06/02/2020: Stage IA (pT1c, pN0, cM0, G3, ER+, PR-, HER2+) - Signed by Gardenia Phlegm, NP on 06/15/2020  Stage prefix: Initial diagnosis  Histologic grading system: 3 grade system    07/08/2020 - 08/05/2020 Chemotherapy          08/12/2020 -  Chemotherapy    Patient is on Treatment Plan: BREAST PACLITAXEL + TRASTUZUMAB Q7D / TRASTUZUMAB Q21D         CHIEF COMPLIANT: Cycle 5 Taxol Herceptin  INTERVAL HISTORY: Pamela [name] is a 55 y.o. with above-mentioned history of right breast cancer treated with lumpectomy, and is currently on Taxol and Herceptin maintenance. She reports to the clinic today for treatment.  Overall she is tolerating chemo extremely well without any major problems or concerns.  Denies neuropathy.  She has seen her primary care physician who is referring her for a sleep apnea study as well as to healthy weight loss program for losing weight.  ALLERGIES:  is allergic to pollen extract, latex, codeine, shellfish allergy, and betadine [povidone iodine].  MEDICATIONS:  Current Outpatient Medications  Medication Sig Dispense Refill   amLODipine (NORVASC) 10 MG tablet TAKE 1 TABLET  BY MOUTH DAILY. 90 tablet 2   aspirin EC 81 MG tablet Take 1 tablet (81 mg total) by mouth daily. Swallow whole. 30 tablet 11   Azilsartan-Chlorthalidone (EDARBYCLOR) 40-25 MG TABS Take 1 tablet by mouth  daily. 90 tablet 2   hydrOXYzine (ATARAX/VISTARIL) 25 MG tablet Take 1 tablet (25 mg total) by mouth every 6 (six) hours as needed for anxiety. 36 tablet 0   levonorgestrel (MIRENA, 52 MG,) 20 MCG/DAY IUD 1 each by Intrauterine route once.     lidocaine-prilocaine (EMLA) cream Apply 1 application topically once.     LORazepam (ATIVAN) 0.5 MG tablet Take 1 tablet 20 minutes before radiation therapy, PRN anxiety.  May take once daily at home the week before treatment to get used to effects. 30 tablet 0   rosuvastatin (CRESTOR) 40 MG tablet Take 1 tablet (40 mg total) by mouth daily. 90 tablet 3   Vitamin D, Ergocalciferol, (DRISDOL) 1.25 MG (50000 UNIT) CAPS capsule Take 1 capsule (50,000 Units total) by mouth every 7 (seven) days. 24 capsule 1   No current facility-administered medications for this visit.    PHYSICAL EXAMINATION: ECOG PERFORMANCE STATUS: 1 - Symptomatic but completely ambulatory  Vitals:   09/02/20 1037  BP: (!) 148/71  Pulse: 79  Resp: 18  Temp: 97.6 F (36.4 C)  SpO2: (!) 79%   Filed Weights   09/02/20 1037  Weight: (!) 333 lb 4.8 oz (151.2 kg)    LABORATORY DATA:  I have reviewed the data as listed CMP Latest Ref Rng & Units 08/26/2020 08/19/2020 08/12/2020  Glucose 70 - 99 mg/dL 97 110(H) 139(H)  BUN 6 - 20 mg/dL 9 17 12   Creatinine 0.44 - 1.00 mg/dL 0.79 0.97 0.85  Sodium 135 - 145 mmol/L 141 138 141  Potassium 3.5 - 5.1 mmol/L 3.8 3.6 3.4(L)  Chloride 98 - 111 mmol/L 105 100 101  CO2 22 - 32 mmol/L 28 29 30   Calcium 8.9 - 10.3 mg/dL 8.8(L) 9.2 10.0  Total Protein 6.5 - 8.1 g/dL 6.7 6.9 7.3  Total Bilirubin 0.3 - 1.2 mg/dL 0.6 0.8 1.1  Alkaline Phos 38 - 126 U/L 63 57 66  AST 15 - 41 U/L 13(L) 17 15  ALT 0 - 44 U/L 14 18 15     Lab Results  Component Value Date   WBC 5.4 08/26/2020   HGB 10.3 (L) 08/26/2020   HCT 32.1 (L) 08/26/2020   MCV 82.5 08/26/2020   PLT 268 08/26/2020   NEUTROABS 3.2 08/26/2020    ASSESSMENT & PLAN:  Malignant neoplasm  of lower-outer quadrant of right breast of female, estrogen receptor positive ([place]) positive ([place]) 05/03/2020:Screening mammogram showed a 1.0cm mass at the 6 o'clock position in the right breast. Diagnostic mammogram and US showed the 1.2cm mass at the 6 o'clock position in the right breast. Biopsy showed IDC, grade 3, HER-2 positive (3+), ER 15% weak, PR-, Ki67 80%.   06/02/2020:Right lumpectomy ([facility]): invasive and in situ ductal carcinoma, 1.3cm, clear margins, 1 right axillary lymph node negative for carcinoma. ER 15% weak, PR-,HER-2 positive (3+) Ki67 80%.   Treatment plan: 1.  Adjuvant chemotherapy with Taxol Herceptin followed by Herceptin maintenance. 2.  Adjuvant radiation therapy 3.  Adjuvant antiestrogen therapy ----------------------------------------------------------------------------------------------------- Current Treatment:Cycle 5 Taxol-Herceptin ECHO 05/20/20: EF 60-65%   Chemo Toxicities:  Fatigue: We will start her on weekly B12 injections. Anemia: Stable hemoglobin 10.3 Muscle aches and pains Nosebleeds: Encouraged her to use a humidifier at bedtime and moisturize with Vaseline  RTC weekly with Taxol and every other week for follow up with me      No orders of the defined types were placed in this encounter.  The patient has a good understanding of the overall plan. she agrees with it. she will call with any problems that may develop before the next visit here.  Total time spent: 30 mins including face to face time and time spent for planning, charting and coordination of care  Rulon Eisenmenger, MD, MPH 09/02/2020  I, Thana Ates, am acting as scribe for Dr. Nicholas Lose.  I have reviewed the above documentation for accuracy and completeness, and I agree with the above.

## 2020-09-02 ENCOUNTER — Inpatient Hospital Stay (HOSPITAL_BASED_OUTPATIENT_CLINIC_OR_DEPARTMENT_OTHER): Payer: 59 | Admitting: Hematology and Oncology

## 2020-09-02 ENCOUNTER — Other Ambulatory Visit: Payer: Self-pay

## 2020-09-02 ENCOUNTER — Inpatient Hospital Stay: Payer: 59

## 2020-09-02 ENCOUNTER — Inpatient Hospital Stay: Payer: 59 | Attending: Hematology and Oncology

## 2020-09-02 ENCOUNTER — Encounter: Payer: Self-pay | Admitting: Hematology and Oncology

## 2020-09-02 ENCOUNTER — Ambulatory Visit (INDEPENDENT_AMBULATORY_CARE_PROVIDER_SITE_OTHER): Payer: 59 | Admitting: Cardiovascular Disease

## 2020-09-02 ENCOUNTER — Other Ambulatory Visit: Payer: Self-pay | Admitting: Cardiovascular Disease

## 2020-09-02 ENCOUNTER — Other Ambulatory Visit: Payer: Self-pay | Admitting: *Deleted

## 2020-09-02 ENCOUNTER — Encounter: Payer: Self-pay | Admitting: Cardiovascular Disease

## 2020-09-02 ENCOUNTER — Ambulatory Visit: Payer: 59

## 2020-09-02 VITALS — HR 62

## 2020-09-02 DIAGNOSIS — Z808 Family history of malignant neoplasm of other organs or systems: Secondary | ICD-10-CM | POA: Diagnosis not present

## 2020-09-02 DIAGNOSIS — G629 Polyneuropathy, unspecified: Secondary | ICD-10-CM | POA: Diagnosis not present

## 2020-09-02 DIAGNOSIS — I10 Essential (primary) hypertension: Secondary | ICD-10-CM | POA: Diagnosis not present

## 2020-09-02 DIAGNOSIS — Z95828 Presence of other vascular implants and grafts: Secondary | ICD-10-CM

## 2020-09-02 DIAGNOSIS — Z8249 Family history of ischemic heart disease and other diseases of the circulatory system: Secondary | ICD-10-CM | POA: Insufficient documentation

## 2020-09-02 DIAGNOSIS — Z5112 Encounter for antineoplastic immunotherapy: Secondary | ICD-10-CM | POA: Insufficient documentation

## 2020-09-02 DIAGNOSIS — D649 Anemia, unspecified: Secondary | ICD-10-CM | POA: Diagnosis not present

## 2020-09-02 DIAGNOSIS — C50511 Malignant neoplasm of lower-outer quadrant of right female breast: Secondary | ICD-10-CM

## 2020-09-02 DIAGNOSIS — R5382 Chronic fatigue, unspecified: Secondary | ICD-10-CM | POA: Insufficient documentation

## 2020-09-02 DIAGNOSIS — Z5111 Encounter for antineoplastic chemotherapy: Secondary | ICD-10-CM | POA: Insufficient documentation

## 2020-09-02 DIAGNOSIS — G4733 Obstructive sleep apnea (adult) (pediatric): Secondary | ICD-10-CM

## 2020-09-02 DIAGNOSIS — Z803 Family history of malignant neoplasm of breast: Secondary | ICD-10-CM | POA: Diagnosis not present

## 2020-09-02 DIAGNOSIS — Z833 Family history of diabetes mellitus: Secondary | ICD-10-CM | POA: Insufficient documentation

## 2020-09-02 DIAGNOSIS — E66813 Obesity, class 3: Secondary | ICD-10-CM

## 2020-09-02 DIAGNOSIS — E785 Hyperlipidemia, unspecified: Secondary | ICD-10-CM

## 2020-09-02 DIAGNOSIS — Z79899 Other long term (current) drug therapy: Secondary | ICD-10-CM | POA: Diagnosis not present

## 2020-09-02 DIAGNOSIS — Z6841 Body Mass Index (BMI) 40.0 and over, adult: Secondary | ICD-10-CM

## 2020-09-02 DIAGNOSIS — E7800 Pure hypercholesterolemia, unspecified: Secondary | ICD-10-CM

## 2020-09-02 DIAGNOSIS — Z801 Family history of malignant neoplasm of trachea, bronchus and lung: Secondary | ICD-10-CM | POA: Insufficient documentation

## 2020-09-02 DIAGNOSIS — Z170 Estrogen receptor positive status [ER+]: Secondary | ICD-10-CM | POA: Insufficient documentation

## 2020-09-02 LAB — CBC WITH DIFFERENTIAL (CANCER CENTER ONLY)
Abs Immature Granulocytes: 0.15 10*3/uL — ABNORMAL HIGH (ref 0.00–0.07)
Basophils Absolute: 0.1 10*3/uL (ref 0.0–0.1)
Basophils Relative: 1 %
Eosinophils Absolute: 0.1 10*3/uL (ref 0.0–0.5)
Eosinophils Relative: 2 %
HCT: 32.5 % — ABNORMAL LOW (ref 36.0–46.0)
Hemoglobin: 10.3 g/dL — ABNORMAL LOW (ref 12.0–15.0)
Immature Granulocytes: 2 %
Lymphocytes Relative: 23 %
Lymphs Abs: 1.5 10*3/uL (ref 0.7–4.0)
MCH: 26.7 pg (ref 26.0–34.0)
MCHC: 31.7 g/dL (ref 30.0–36.0)
MCV: 84.2 fL (ref 80.0–100.0)
Monocytes Absolute: 0.5 10*3/uL (ref 0.1–1.0)
Monocytes Relative: 8 %
Neutro Abs: 4.2 10*3/uL (ref 1.7–7.7)
Neutrophils Relative %: 64 %
Platelet Count: 322 10*3/uL (ref 150–400)
RBC: 3.86 MIL/uL — ABNORMAL LOW (ref 3.87–5.11)
RDW: 16.8 % — ABNORMAL HIGH (ref 11.5–15.5)
WBC Count: 6.5 10*3/uL (ref 4.0–10.5)
nRBC: 0 % (ref 0.0–0.2)

## 2020-09-02 LAB — CMP (CANCER CENTER ONLY)
ALT: 20 U/L (ref 0–44)
AST: 17 U/L (ref 15–41)
Albumin: 3.6 g/dL (ref 3.5–5.0)
Alkaline Phosphatase: 67 U/L (ref 38–126)
Anion gap: 9 (ref 5–15)
BUN: 16 mg/dL (ref 6–20)
CO2: 29 mmol/L (ref 22–32)
Calcium: 9.6 mg/dL (ref 8.9–10.3)
Chloride: 104 mmol/L (ref 98–111)
Creatinine: 0.88 mg/dL (ref 0.44–1.00)
GFR, Estimated: >60 mL/min (ref >60)
Glucose, Bld: 103 mg/dL — ABNORMAL HIGH (ref 70–99)
Potassium: 4.1 mmol/L (ref 3.5–5.1)
Sodium: 142 mmol/L (ref 135–145)
Total Bilirubin: 0.6 mg/dL (ref 0.3–1.2)
Total Protein: 7 g/dL (ref 6.5–8.1)

## 2020-09-02 MED ORDER — SODIUM CHLORIDE 0.9% FLUSH
10.0000 mL | Freq: Once | INTRAVENOUS | Status: AC
Start: 2020-09-02 — End: 2020-09-02
  Administered 2020-09-02: 10 mL
  Filled 2020-09-02: qty 10

## 2020-09-02 MED ORDER — SODIUM CHLORIDE 0.9% FLUSH
10.0000 mL | INTRAVENOUS | Status: DC | PRN
  Administered 2020-09-02: 10 mL
  Filled 2020-09-02: qty 10

## 2020-09-02 MED ORDER — HEPARIN SOD (PORK) LOCK FLUSH 100 UNIT/ML IV SOLN
500.0000 [IU] | Freq: Once | INTRAVENOUS | Status: AC | PRN
  Administered 2020-09-02: 500 [IU]
  Filled 2020-09-02: qty 5

## 2020-09-02 MED ORDER — SODIUM CHLORIDE 0.9 % IV SOLN
Freq: Once | INTRAVENOUS | Status: AC
  Filled 2020-09-02: qty 250

## 2020-09-02 MED ORDER — TRASTUZUMAB-ANNS CHEMO 150 MG IV SOLR
2.0000 mg/kg | Freq: Once | INTRAVENOUS | Status: AC
  Administered 2020-09-02: 315 mg via INTRAVENOUS
  Filled 2020-09-02: qty 15

## 2020-09-02 MED ORDER — FAMOTIDINE 20 MG IN NS 100 ML IVPB
INTRAVENOUS | Status: AC
  Filled 2020-09-02: qty 100

## 2020-09-02 MED ORDER — SODIUM CHLORIDE 0.9 % IV SOLN
50.0000 mg/m2 | Freq: Once | INTRAVENOUS | Status: AC
  Administered 2020-09-02: 138 mg via INTRAVENOUS
  Filled 2020-09-02: qty 23

## 2020-09-02 MED ORDER — DIPHENHYDRAMINE HCL 50 MG/ML IJ SOLN
25.0000 mg | Freq: Once | INTRAMUSCULAR | Status: AC
Start: 2020-09-02 — End: 2020-09-02
  Administered 2020-09-02: 25 mg via INTRAVENOUS

## 2020-09-02 MED ORDER — SODIUM CHLORIDE 0.9 % IV SOLN
10.0000 mg | Freq: Once | INTRAVENOUS | Status: AC
  Administered 2020-09-02: 10 mg via INTRAVENOUS
  Filled 2020-09-02: qty 10

## 2020-09-02 MED ORDER — DIPHENHYDRAMINE HCL 50 MG/ML IJ SOLN
INTRAMUSCULAR | Status: AC
  Filled 2020-09-02: qty 1

## 2020-09-02 MED ORDER — ACETAMINOPHEN 325 MG PO TABS
ORAL_TABLET | ORAL | Status: AC
  Filled 2020-09-02: qty 2

## 2020-09-02 MED ORDER — ACETAMINOPHEN 325 MG PO TABS
650.0000 mg | ORAL_TABLET | Freq: Once | ORAL | Status: AC
  Administered 2020-09-02: 650 mg via ORAL

## 2020-09-02 MED ORDER — CYANOCOBALAMIN 1000 MCG/ML IJ SOLN
1000.0000 ug | Freq: Once | INTRAMUSCULAR | Status: AC
  Administered 2020-09-02: 1000 ug via INTRAMUSCULAR

## 2020-09-02 MED ORDER — FAMOTIDINE 20 MG IN NS 100 ML IVPB
20.0000 mg | Freq: Once | INTRAVENOUS | Status: AC
  Administered 2020-09-02: 20 mg via INTRAVENOUS

## 2020-09-02 MED ORDER — CYANOCOBALAMIN 1000 MCG/ML IJ SOLN
INTRAMUSCULAR | Status: AC
  Filled 2020-09-02: qty 1

## 2020-09-02 NOTE — Assessment & Plan Note (Signed)
History of symptoms compatible with obstructive sleep apnea with nocturnal snoring, daytime somnolence and severe obesity.  She is open to having a home sleep study to further evaluate.

## 2020-09-02 NOTE — Assessment & Plan Note (Signed)
History of essential hypertension a blood pressure measured today 130/86.  She is on amlodipine, and Edarbi chlor.

## 2020-09-02 NOTE — Assessment & Plan Note (Signed)
positive (Needles) 05/03/2020:Screening mammogram showed a 1.0cm mass at the 6 o'clock position in the right breast. Diagnostic mammogram and US showed the 1.2cm mass at the 6 o'clock position in the right breast. Biopsy showed IDC, grade 3, HER-2 positive (3+), ER 15% weak, PR-, Ki67 80%.  06/02/2020:Right lumpectomy ([facility]): invasive and in situ ductal carcinoma, 1.3cm, clear margins, 1 right axillary lymph node negative for carcinoma. ER 15% weak, PR-,HER-2 positive (3+) Ki67 80%.  Treatment plan: 1.Adjuvant chemotherapy with Taxol Herceptinfollowed byHerceptin maintenance. 2.Adjuvant radiation therapy 3.Adjuvant antiestrogen therapy ----------------------------------------------------------------------------------------------------- Current Treatment:Cycle 3 Taxol-Herceptin ECHO 05/20/20: EF 60-65%  Chemo Toxicities:  Fatigue: We will start her on weekly B12 injections. Anemia: I discussed with her about eating iron-containing foods.  RTC weekly with Taxol and every other week for follow up with me

## 2020-09-02 NOTE — Patient Instructions (Signed)
  Testing/Procedures:  CORONARY CT CALCIUM SCORING AT [place]   Follow-Up: At [facility], you and your health needs are our priority.  As part of our continuing mission to provide you with exceptional heart care, we have created designated Provider Care Teams.  These Care Teams include your primary Cardiologist (physician) and Advanced Practice Providers (APPs -  Physician Assistants and Nurse Practitioners) who all work together to provide you with the care you need, when you need it.  We recommend signing up for the patient portal called "MyChart".  Sign up information is provided on this After Visit Summary.  MyChart is used to connect with patients for Virtual Visits (Telemedicine).  Patients are able to view lab/test results, encounter notes, upcoming appointments, etc.  Non-urgent messages can be sent to your provider as well.   To learn more about what you can do with MyChart, go to [email or web address].    Your next appointment:   12 month(s)  The format for your next appointment:   In Person  Provider:   Quay Burow, MD

## 2020-09-02 NOTE — Patient Instructions (Signed)
[facility]  Discharge Instructions: Thank you for choosing [facility] to provide your oncology and hematology care.   If you have a lab appointment with the [facility], please go directly to the [facility] and check in at the registration area.   Wear comfortable clothing and clothing appropriate for easy access to any Portacath or PICC line.   We strive to give you quality time with your provider. You may need to reschedule your appointment if you arrive late (15 or more minutes).  Arriving late affects you and other patients whose appointments are after yours.  Also, if you miss three or more appointments without notifying the office, you may be dismissed from the clinic at the provider's discretion.      For prescription refill requests, have your pharmacy contact our office and allow 72 hours for refills to be completed.    Today you received the following chemotherapy and/or immunotherapy agents trastuzumab/taxol      To help prevent nausea and vomiting after your treatment, we encourage you to take your nausea medication as directed.  BELOW ARE SYMPTOMS THAT SHOULD BE REPORTED IMMEDIATELY: *FEVER GREATER THAN 100.4 F (38 C) OR HIGHER *CHILLS OR SWEATING *NAUSEA AND VOMITING THAT IS NOT CONTROLLED WITH YOUR NAUSEA MEDICATION *UNUSUAL SHORTNESS OF BREATH *UNUSUAL BRUISING OR BLEEDING *URINARY PROBLEMS (pain or burning when urinating, or frequent urination) *BOWEL PROBLEMS (unusual diarrhea, constipation, pain near the anus) TENDERNESS IN MOUTH AND THROAT WITH OR WITHOUT PRESENCE OF ULCERS (sore throat, sores in mouth, or a toothache) UNUSUAL RASH, SWELLING OR PAIN  UNUSUAL VAGINAL DISCHARGE OR ITCHING   Items with * indicate a potential emergency and should be followed up as soon as possible or go to the Emergency Department if any problems should occur.  Please show the CHEMOTHERAPY ALERT CARD or IMMUNOTHERAPY ALERT CARD at  check-in to the Emergency Department and triage nurse.  Should you have questions after your visit or need to cancel or reschedule your appointment, please contact [facility]  Dept: [phone]  and follow the prompts.  Office hours are 8:00 a.m. to 4:30 p.m. Monday - Friday. Please note that voicemails left after 4:00 p.m. may not be returned until the following business day.  We are closed weekends and major holidays. You have access to a nurse at all times for urgent questions. Please call the main number to the clinic Dept: [phone] and follow the prompts.   For any non-urgent questions, you may also contact your provider using MyChart. We now offer e-Visits for anyone 22 and older to request care online for non-urgent symptoms. For details visit [email or web address].   Also download the MyChart app! Go to the app store, search "MyChart", open the app, select [facility], and log in with your MyChart username and password.  Due to Covid, a mask is required upon entering the hospital/clinic. If you do not have a mask, one will be given to you upon arrival. For doctor visits, patients may have 1 support person aged 73 or older with them. For treatment visits, patients cannot have anyone with them due to current Covid guidelines and our immunocompromised population.

## 2020-09-02 NOTE — Assessment & Plan Note (Signed)
History of hyperlipidemia on high-dose statin therapy with lipid profile performed 06/21/2020 revealing total cholesterol 198, LDL 129 HDL 47.  I am going to get a coronary calcium score to help guide how aggressive to be with further lipid management.

## 2020-09-02 NOTE — Progress Notes (Signed)
09/02/2020 Pamela [name]   January 19, 1966  [id number]  Primary Physician Pamela Chard, MD Primary Cardiologist: Pamela Harp MD Pamela [name], [place]  HPI:  Pamela [name] is a 55 y.o. morbidly overweight single African-American female mother of 2 children, soon-to-be grandmother of 1 grandchild referred by [name] for cardiovascular evaluation because of hypertension and left bundle branch block. She has seen Pamela [name] in the past as well several years ago when she had an abnormal EKG. I last saw her in the office 10/08/2019. Her cardiac risk factor profile is notable for treated hypertension, hyperlipidemia and family history (father at age 2, mother at age 31 had myocardial infarction's). She is never had a heart attack or stroke. She denies chest pain or shortness of breath. 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 She was recently hospitalized overnight for hypertensive urgency and diastolic heart failure 40/98/11. Her enzymes were mildly elevated thought to be related to demand ischemia. 2D echo revealed normal LV function with grade 2 diastolic dysfunction. She was diuresed.  She is very salt restriction. She also has symptoms compatible with obstructive sleep apnea. When she was admitted she was complaining of some chest heaviness.  Since I saw her a year ago she was diagnosed with breast cancer.  She has had a lumpectomy, chemotherapy and is getting ready to start radiation therapy.  She also contracted COVID-19 back in January and had 2 days of flulike symptoms.  She denies chest pain or shortness of breath.  We did talk about obstructive sleep apnea which she is complaining of symptoms compatible with but she has never pursued a outpatient sleep study.   Current Meds  Medication Sig   amLODipine (NORVASC) 10 MG tablet TAKE 1 TABLET BY MOUTH DAILY.   aspirin EC 81 MG tablet Take 1 tablet (81 mg total) by mouth daily. Swallow whole.    Azilsartan-Chlorthalidone (EDARBYCLOR) 40-25 MG TABS Take 1 tablet by mouth daily.   hydrOXYzine (ATARAX/VISTARIL) 25 MG tablet Take 1 tablet (25 mg total) by mouth every 6 (six) hours as needed for anxiety.   levonorgestrel (MIRENA, 52 MG,) 20 MCG/DAY IUD 1 each by Intrauterine route once.   lidocaine-prilocaine (EMLA) cream Apply 1 application topically once.   LORazepam (ATIVAN) 0.5 MG tablet Take 1 tablet 20 minutes before radiation therapy, PRN anxiety.  May take once daily at home the week before treatment to get used to effects.   rosuvastatin (CRESTOR) 40 MG tablet Take 1 tablet (40 mg total) by mouth daily.   Vitamin D, Ergocalciferol, (DRISDOL) 1.25 MG (50000 UNIT) CAPS capsule Take 1 capsule (50,000 Units total) by mouth every 7 (seven) days.   [DISCONTINUED] HYDROcodone-acetaminophen (NORCO) 5-325 MG tablet 1/2 to 1 tablet Q 4 hours prn pain     Allergies  Allergen Reactions   Pollen Extract Shortness Of Breath   Latex Rash   Codeine Hives   Shellfish Allergy Hives   Betadine [Povidone Iodine] Rash    Social History   Socioeconomic History   Marital status: Single    Spouse name: Not on file   Number of children: Not on file   Years of education: Not on file   Highest education level: Not on file  Occupational History   Not on file  Tobacco Use   Smoking status: Never   Smokeless tobacco: Never  Vaping Use   Vaping Use: Never used  Substance and Sexual Activity   Alcohol use: No  Drug use: Not Currently    Frequency: 2.0 times per week   Sexual activity: Never    Birth control/protection: I.U.D.    Comment: Mirena  Other Topics Concern   Not on file  Social History Narrative   Not on file   Social Determinants of Health   Financial Resource Strain: Not on file  Food Insecurity: Not on file  Transportation Needs: Not on file  Physical Activity: Not on file  Stress: Not on file  Social Connections: Not on file  Intimate Partner Violence: Not on file      Review of Systems: General: negative for chills, fever, night sweats or weight changes.  Cardiovascular: negative for chest pain, dyspnea on exertion, edema, orthopnea, palpitations, paroxysmal nocturnal dyspnea or shortness of breath Dermatological: negative for rash Respiratory: negative for cough or wheezing Urologic: negative for hematuria Abdominal: negative for nausea, vomiting, diarrhea, bright red blood per rectum, melena, or hematemesis Neurologic: negative for visual changes, syncope, or dizziness All other systems reviewed and are otherwise negative except as noted above.    Blood pressure 130/86, pulse 90, height 5\' 9"  (1.753 m), weight (!) 333 lb (151 kg), SpO2 93 %.  General appearance: alert and no distress Neck: no adenopathy, no carotid bruit, no JVD, supple, symmetrical, trachea midline, and thyroid not enlarged, symmetric, no tenderness/mass/nodules Lungs: clear to auscultation bilaterally Heart: regular rate and rhythm, S1, S2 normal, no murmur, click, rub or gallop Extremities: extremities normal, atraumatic, no cyanosis or edema Pulses: 2+ and symmetric Skin: Skin color, texture, turgor normal. No rashes or lesions Neurologic: Grossly normal  EKG not performed today  ASSESSMENT AND PLAN:   Class 3 severe obesity due to excess calories with serious comorbidity and body mass index (BMI) of 45.0 to 49.9 in adult [facility]) We didHistory of morbid obesity with a BMI of approximately 50 talked about weight loss which she has been unsuccessful doing on her own.  She is agreed to go to the diet and wellness center at [facility] to have Pamela [name] assist her with this.  High cholesterol History of hyperlipidemia on high-dose statin therapy with lipid profile performed 06/21/2020 revealing total cholesterol 198, LDL 129 HDL 47.  I am going to get a coronary calcium score to help guide how aggressive to be with further lipid management.  Essential hypertension History of  essential hypertension a blood pressure measured today 130/86.  She is on amlodipine, and Edarbi chlor.  Obstructive sleep apnea syndrome, moderate History of symptoms compatible with obstructive sleep apnea with nocturnal snoring, daytime somnolence and severe obesity.  She is open to having a home sleep study to further evaluate.     Pamela Harp MD FACP,FACC,FAHA, FSCAI 09/02/2020 9:00 AM

## 2020-09-02 NOTE — Patient Instructions (Signed)

## 2020-09-02 NOTE — Assessment & Plan Note (Signed)
We didHistory of morbid obesity with a BMI of approximately 50 talked about weight loss which she has been unsuccessful doing on her own.  She is agreed to go to the diet and wellness center at [facility] to have Dr. Leafy Ro assist her with this.

## 2020-09-06 ENCOUNTER — Ambulatory Visit: Payer: 59

## 2020-09-07 ENCOUNTER — Ambulatory Visit: Payer: 59

## 2020-09-08 ENCOUNTER — Ambulatory Visit: Payer: 59

## 2020-09-09 ENCOUNTER — Other Ambulatory Visit: Payer: Self-pay

## 2020-09-09 ENCOUNTER — Inpatient Hospital Stay: Payer: 59

## 2020-09-09 ENCOUNTER — Ambulatory Visit: Payer: 59

## 2020-09-09 VITALS — BP 121/70 | HR 66 | Temp 98.0°F | Resp 18 | Wt 333.2 lb

## 2020-09-09 DIAGNOSIS — C50511 Malignant neoplasm of lower-outer quadrant of right female breast: Secondary | ICD-10-CM

## 2020-09-09 DIAGNOSIS — Z170 Estrogen receptor positive status [ER+]: Secondary | ICD-10-CM

## 2020-09-09 DIAGNOSIS — Z5112 Encounter for antineoplastic immunotherapy: Secondary | ICD-10-CM | POA: Diagnosis not present

## 2020-09-09 DIAGNOSIS — Z95828 Presence of other vascular implants and grafts: Secondary | ICD-10-CM

## 2020-09-09 LAB — CMP (CANCER CENTER ONLY)
ALT: 20 U/L (ref 0–44)
AST: 14 U/L — ABNORMAL LOW (ref 15–41)
Albumin: 4.1 g/dL (ref 3.5–5.0)
Alkaline Phosphatase: 57 U/L (ref 38–126)
Anion gap: 9 (ref 5–15)
BUN: 13 mg/dL (ref 6–20)
CO2: 29 mmol/L (ref 22–32)
Calcium: 9.6 mg/dL (ref 8.9–10.3)
Chloride: 101 mmol/L (ref 98–111)
Creatinine: 0.81 mg/dL (ref 0.44–1.00)
GFR, Estimated: >60 mL/min (ref >60)
Glucose, Bld: 116 mg/dL — ABNORMAL HIGH (ref 70–99)
Potassium: 3.6 mmol/L (ref 3.5–5.1)
Sodium: 139 mmol/L (ref 135–145)
Total Bilirubin: 0.7 mg/dL (ref 0.3–1.2)
Total Protein: 6.8 g/dL (ref 6.5–8.1)

## 2020-09-09 LAB — CBC WITH DIFFERENTIAL (CANCER CENTER ONLY)
Abs Immature Granulocytes: 0.27 10*3/uL — ABNORMAL HIGH (ref 0.00–0.07)
Basophils Absolute: 0.1 10*3/uL (ref 0.0–0.1)
Basophils Relative: 1 %
Eosinophils Absolute: 0.2 10*3/uL (ref 0.0–0.5)
Eosinophils Relative: 2 %
HCT: 33.9 % — ABNORMAL LOW (ref 36.0–46.0)
Hemoglobin: 10.6 g/dL — ABNORMAL LOW (ref 12.0–15.0)
Immature Granulocytes: 4 %
Lymphocytes Relative: 26 %
Lymphs Abs: 2 10*3/uL (ref 0.7–4.0)
MCH: 26.3 pg (ref 26.0–34.0)
MCHC: 31.3 g/dL (ref 30.0–36.0)
MCV: 84.1 fL (ref 80.0–100.0)
Monocytes Absolute: 0.5 10*3/uL (ref 0.1–1.0)
Monocytes Relative: 7 %
Neutro Abs: 4.7 10*3/uL (ref 1.7–7.7)
Neutrophils Relative %: 60 %
Platelet Count: 319 10*3/uL (ref 150–400)
RBC: 4.03 MIL/uL (ref 3.87–5.11)
RDW: 17.2 % — ABNORMAL HIGH (ref 11.5–15.5)
WBC Count: 7.6 10*3/uL (ref 4.0–10.5)
nRBC: 0 % (ref 0.0–0.2)

## 2020-09-09 MED ORDER — DIPHENHYDRAMINE HCL 50 MG/ML IJ SOLN
25.0000 mg | Freq: Once | INTRAMUSCULAR | Status: AC
  Administered 2020-09-09: 25 mg via INTRAVENOUS
  Filled 2020-09-09: qty 1

## 2020-09-09 MED ORDER — SODIUM CHLORIDE 0.9% FLUSH
10.0000 mL | INTRAVENOUS | Status: DC | PRN
  Administered 2020-09-09: 10 mL
  Filled 2020-09-09: qty 10

## 2020-09-09 MED ORDER — SODIUM CHLORIDE 0.9 % IV SOLN
50.0000 mg/m2 | Freq: Once | INTRAVENOUS | Status: AC
  Administered 2020-09-09: 138 mg via INTRAVENOUS
  Filled 2020-09-09: qty 23

## 2020-09-09 MED ORDER — HEPARIN SOD (PORK) LOCK FLUSH 100 UNIT/ML IV SOLN
500.0000 [IU] | Freq: Once | INTRAVENOUS | Status: AC | PRN
Start: 2020-09-09 — End: 2020-09-09
  Administered 2020-09-09: 500 [IU]
  Filled 2020-09-09: qty 5

## 2020-09-09 MED ORDER — TRASTUZUMAB-ANNS CHEMO 150 MG IV SOLR
300.0000 mg | Freq: Once | INTRAVENOUS | Status: AC
  Administered 2020-09-09: 300 mg via INTRAVENOUS
  Filled 2020-09-09: qty 14.29

## 2020-09-09 MED ORDER — SODIUM CHLORIDE 0.9 % IV SOLN
10.0000 mg | Freq: Once | INTRAVENOUS | Status: AC
  Administered 2020-09-09: 10 mg via INTRAVENOUS
  Filled 2020-09-09: qty 1

## 2020-09-09 MED ORDER — ACETAMINOPHEN 325 MG PO TABS
650.0000 mg | ORAL_TABLET | Freq: Once | ORAL | Status: AC
  Administered 2020-09-09: 650 mg via ORAL
  Filled 2020-09-09: qty 2

## 2020-09-09 MED ORDER — CYANOCOBALAMIN 1000 MCG/ML IJ SOLN
1000.0000 ug | Freq: Once | INTRAMUSCULAR | Status: AC
  Administered 2020-09-09: 1000 ug via INTRAMUSCULAR
  Filled 2020-09-09: qty 1

## 2020-09-09 MED ORDER — FAMOTIDINE 20 MG IN NS 100 ML IVPB
20.0000 mg | Freq: Once | INTRAVENOUS | Status: AC
  Administered 2020-09-09: 20 mg via INTRAVENOUS
  Filled 2020-09-09: qty 100

## 2020-09-09 MED ORDER — SODIUM CHLORIDE 0.9 % IV SOLN
Freq: Once | INTRAVENOUS | Status: AC
  Filled 2020-09-09: qty 250

## 2020-09-09 NOTE — Patient Instructions (Signed)
Implanted Port Home Guide An implanted port is a device that is placed under the skin. It is usually placed in the chest. The device can be used to give IV medicine, to take blood, or for dialysis. You may have an implanted port if: You need IV medicine that would be irritating to the small veins in your hands or arms. You need IV medicines, such as antibiotics, for a long period of time. You need IV nutrition for a long period of time. You need dialysis. When you have a port, your health care provider can choose to use the port instead of veins in your arms for these procedures. You may have fewer limitations when using a port than you would if you used other types of long-term IVs, and you will likely be able to return to normal activities afteryour incision heals. An implanted port has two main parts: Reservoir. The reservoir is the part where a needle is inserted to give medicines or draw blood. The reservoir is round. After it is placed, it appears as a small, raised area under your skin. Catheter. The catheter is a thin, flexible tube that connects the reservoir to a vein. Medicine that is inserted into the reservoir goes into the catheter and then into the vein. How is my port accessed? To access your port: A numbing cream may be placed on the skin over the port site. Your health care provider will put on a mask and sterile gloves. The skin over your port will be cleaned carefully with a germ-killing soap and allowed to dry. Your health care provider will gently pinch the port and insert a needle into it. Your health care provider will check for a blood return to make sure the port is in the vein and is not clogged. If your port needs to remain accessed to get medicine continuously (constant infusion), your health care provider will place a clear bandage (dressing) over the needle site. The dressing and needle will need to be changed every week, or as told by your health care provider. What  is flushing? Flushing helps keep the port from getting clogged. Follow instructions from your health care provider about how and when to flush the port. Ports are usually flushed with saline solution or a medicine called heparin. The need for flushing will depend on how the port is used: If the port is only used from time to time to give medicines or draw blood, the port may need to be flushed: Before and after medicines have been given. Before and after blood has been drawn. As part of routine maintenance. Flushing may be recommended every 4-6 weeks. If a constant infusion is running, the port may not need to be flushed. Throw away any syringes in a disposal container that is meant for sharp items (sharps container). You can buy a sharps container from a pharmacy, or you can make one by using an empty hard plastic bottle with a cover. How long will my port stay implanted? The port can stay in for as long as your health care provider thinks it is needed. When it is time for the port to come out, a surgery will be done to remove it. The surgery will be similar to the procedure that was done to putthe port in. Follow these instructions at home:  Flush your port as told by your health care provider. If you need an infusion over several days, follow instructions from your health care provider about how to take   care of your port site. Make sure you: Wash your hands with soap and water before you change your dressing. If soap and water are not available, use alcohol-based hand sanitizer. Change your dressing as told by your health care provider. Place any used dressings or infusion bags into a plastic bag. Throw that bag in the trash. Keep the dressing that covers the needle clean and dry. Do not get it wet. Do not use scissors or sharp objects near the tube. Keep the tube clamped, unless it is being used. Check your port site every day for signs of infection. Check for: Redness, swelling, or  pain. Fluid or blood. Pus or a bad smell. Protect the skin around the port site. Avoid wearing bra straps that rub or irritate the site. Protect the skin around your port from seat belts. Place a soft pad over your chest if needed. Bathe or shower as told by your health care provider. The site may get wet as long as you are not actively receiving an infusion. Return to your normal activities as told by your health care provider. Ask your health care provider what activities are safe for you. Carry a medical alert card or wear a medical alert bracelet at all times. This will let health care providers know that you have an implanted port in case of an emergency. Get help right away if: You have redness, swelling, or pain at the port site. You have fluid or blood coming from your port site. You have pus or a bad smell coming from the port site. You have a fever. Summary Implanted ports are usually placed in the chest for long-term IV access. Follow instructions from your health care provider about flushing the port and changing bandages (dressings). Take care of the area around your port by avoiding clothing that puts pressure on the area, and by watching for signs of infection. Protect the skin around your port from seat belts. Place a soft pad over your chest if needed. Get help right away if you have a fever or you have redness, swelling, pain, drainage, or a bad smell at the port site. This information is not intended to replace advice given to you by your health care provider. Make sure you discuss any questions you have with your healthcare provider. Document Revised: 07/06/2019 Document Reviewed: 07/06/2019 Elsevier Patient Education  2022 [employer]  

## 2020-09-09 NOTE — Patient Instructions (Signed)
[facility]  Discharge Instructions: Thank you for choosing [facility] to provide your oncology and hematology care.   If you have a lab appointment with the [facility], please go directly to the [facility] and check in at the registration area.   Wear comfortable clothing and clothing appropriate for easy access to any Portacath or PICC line.   We strive to give you quality time with your provider. You may need to reschedule your appointment if you arrive late (15 or more minutes).  Arriving late affects you and other patients whose appointments are after yours.  Also, if you miss three or more appointments without notifying the office, you may be dismissed from the clinic at the provider's discretion.      For prescription refill requests, have your pharmacy contact our office and allow 72 hours for refills to be completed.    Today you received the following chemotherapy and/or immunotherapy agents trastuzumab/taxol      To help prevent nausea and vomiting after your treatment, we encourage you to take your nausea medication as directed.  BELOW ARE SYMPTOMS THAT SHOULD BE REPORTED IMMEDIATELY: *FEVER GREATER THAN 100.4 F (38 C) OR HIGHER *CHILLS OR SWEATING *NAUSEA AND VOMITING THAT IS NOT CONTROLLED WITH YOUR NAUSEA MEDICATION *UNUSUAL SHORTNESS OF BREATH *UNUSUAL BRUISING OR BLEEDING *URINARY PROBLEMS (pain or burning when urinating, or frequent urination) *BOWEL PROBLEMS (unusual diarrhea, constipation, pain near the anus) TENDERNESS IN MOUTH AND THROAT WITH OR WITHOUT PRESENCE OF ULCERS (sore throat, sores in mouth, or a toothache) UNUSUAL RASH, SWELLING OR PAIN  UNUSUAL VAGINAL DISCHARGE OR ITCHING   Items with * indicate a potential emergency and should be followed up as soon as possible or go to the Emergency Department if any problems should occur.  Please show the CHEMOTHERAPY ALERT CARD or IMMUNOTHERAPY ALERT CARD at check-in  to the Emergency Department and triage nurse.  Should you have questions after your visit or need to cancel or reschedule your appointment, please contact [facility]  Dept: [phone]  and follow the prompts.  Office hours are 8:00 a.m. to 4:30 p.m. Monday - Friday. Please note that voicemails left after 4:00 p.m. may not be returned until the following business day.  We are closed weekends and major holidays. You have access to a nurse at all times for urgent questions. Please call the main number to the clinic Dept: [phone] and follow the prompts.   For any non-urgent questions, you may also contact your provider using MyChart. We now offer e-Visits for anyone 38 and older to request care online for non-urgent symptoms. For details visit [email or web address].   Also download the MyChart app! Go to the app store, search "MyChart", open the app, select [facility], and log in with your MyChart username and password.  Due to Covid, a mask is required upon entering the hospital/clinic. If you do not have a mask, one will be given to you upon arrival. For doctor visits, patients may have 1 support person aged 56 or older with them. For treatment visits, patients cannot have anyone with them due to current Covid guidelines and our immunocompromised population.   Paclitaxel injection What is this medication? PACLITAXEL (PAK li TAX el) is a chemotherapy drug. It targets fast dividing cells, like cancer cells, and causes these cells to die. This medicine is used to treat ovarian cancer, breast cancer, lung cancer, Kaposi's sarcoma, andother cancers. This medicine may be  used for other purposes; ask your health care provider orpharmacist if you have questions. COMMON BRAND NAME(S): Onxol, Taxol What should I tell my care team before I take this medication? They need to know if you have any of these conditions: history of irregular heartbeat liver disease low blood  counts, like low white cell, platelet, or red cell counts lung or breathing disease, like asthma tingling of the fingers or toes, or other nerve disorder an unusual or allergic reaction to paclitaxel, alcohol, polyoxyethylated castor oil, other chemotherapy, other medicines, foods, dyes, or preservatives pregnant or trying to get pregnant breast-feeding How should I use this medication? This drug is given as an infusion into a vein. It is administered in a hospitalor clinic by a specially trained health care professional. Talk to your pediatrician regarding the use of this medicine in children.Special care may be needed. Overdosage: If you think you have taken too much of this medicine contact apoison control center or emergency room at once. NOTE: This medicine is only for you. Do not share this medicine with others. What if I miss a dose? It is important not to miss your dose. Call your doctor or health careprofessional if you are unable to keep an appointment. What may interact with this medication? Do not take this medicine with any of the following medications: live virus vaccines This medicine may also interact with the following medications: antiviral medicines for hepatitis, HIV or AIDS certain antibiotics like erythromycin and clarithromycin certain medicines for fungal infections like ketoconazole and itraconazole certain medicines for seizures like carbamazepine, phenobarbital, phenytoin gemfibrozil nefazodone rifampin St. John's wort This list may not describe all possible interactions. Give your health care provider a list of all the medicines, herbs, non-prescription drugs, or dietary supplements you use. Also tell them if you smoke, drink alcohol, or use illegaldrugs. Some items may interact with your medicine. What should I watch for while using this medication? Your condition will be monitored carefully while you are receiving this medicine. You will need important blood  work done while you are taking thismedicine. This medicine can cause serious allergic reactions. To reduce your risk you will need to take other medicine(s) before treatment with this medicine. If you experience allergic reactions like skin rash, itching or hives, swelling of theface, lips, or tongue, tell your doctor or health care professional right away. In some cases, you may be given additional medicines to help with side effects.Follow all directions for their use. This drug may make you feel generally unwell. This is not uncommon, as chemotherapy can affect healthy cells as well as cancer cells. Report any side effects. Continue your course of treatment even though you feel ill unless yourdoctor tells you to stop. Call your doctor or health care professional for advice if you get a fever, chills or sore throat, or other symptoms of a cold or flu. Do not treat yourself. This drug decreases your body's ability to fight infections. Try toavoid being around people who are sick. This medicine may increase your risk to bruise or bleed. Call your doctor orhealth care professional if you notice any unusual bleeding. Be careful brushing and flossing your teeth or using a toothpick because you may get an infection or bleed more easily. If you have any dental work done,tell your dentist you are receiving this medicine. Avoid taking products that contain aspirin, acetaminophen, ibuprofen, naproxen, or ketoprofen unless instructed by your doctor. These medicines may hide afever. Do not become pregnant while taking this medicine.  Women should inform their doctor if they wish to become pregnant or think they might be pregnant. There is a potential for serious side effects to an unborn child. Talk to your health care professional or pharmacist for more information. Do not breast-feed aninfant while taking this medicine. Men are advised not to father a child while receiving this medicine. This product may contain  alcohol. Ask your pharmacist or healthcare provider if this medicine contains alcohol. Be sure to tell all healthcare providers you are taking this medicine. Certain medicines, like metronidazole and disulfiram, can cause an unpleasant reaction when taken with alcohol. The reaction includes flushing, headache, nausea, vomiting, sweating, and increased thirst. Thereaction can last from 30 minutes to several hours. What side effects may I notice from receiving this medication? Side effects that you should report to your doctor or health care professionalas soon as possible: allergic reactions like skin rash, itching or hives, swelling of the face, lips, or tongue breathing problems changes in vision fast, irregular heartbeat high or low blood pressure mouth sores pain, tingling, numbness in the hands or feet signs of decreased platelets or bleeding - bruising, pinpoint red spots on the skin, black, tarry stools, blood in the urine signs of decreased red blood cells - unusually weak or tired, feeling faint or lightheaded, falls signs of infection - fever or chills, cough, sore throat, pain or difficulty passing urine signs and symptoms of liver injury like dark yellow or brown urine; general ill feeling or flu-like symptoms; light-colored stools; loss of appetite; nausea; right upper belly pain; unusually weak or tired; yellowing of the eyes or skin swelling of the ankles, feet, hands unusually slow heartbeat Side effects that usually do not require medical attention (report to yourdoctor or health care professional if they continue or are bothersome): diarrhea hair loss loss of appetite muscle or joint pain nausea, vomiting pain, redness, or irritation at site where injected tiredness This list may not describe all possible side effects. Call your doctor for medical advice about side effects. You may report side effects to FDA at1-800-FDA-1088. Where should I keep my medication? This drug is  given in a hospital or clinic and will not be stored at home. NOTE: This sheet is a summary. It may not cover all possible information. If you have questions about this medicine, talk to your doctor, pharmacist, orhealth care provider.  2022 Elsevier/Gold Standard (2019-01-21 13:37:23)

## 2020-09-12 ENCOUNTER — Ambulatory Visit: Payer: 59

## 2020-09-12 ENCOUNTER — Encounter: Payer: Self-pay | Admitting: *Deleted

## 2020-09-14 ENCOUNTER — Telehealth: Payer: Self-pay | Admitting: Adult Health

## 2020-09-14 NOTE — Telephone Encounter (Signed)
Rescheduled per provider called and spoke with pt confirmed that 7/15 appt would be a virtual visit

## 2020-09-15 ENCOUNTER — Other Ambulatory Visit: Payer: 59

## 2020-09-15 ENCOUNTER — Ambulatory Visit: Payer: 59

## 2020-09-15 ENCOUNTER — Ambulatory Visit: Payer: 59 | Admitting: Hematology and Oncology

## 2020-09-15 NOTE — Progress Notes (Signed)
Pamela [name]:    [name], Pamela [name] [id number]   DIAGNOSIS: Cancer Staging Malignant neoplasm of lower-outer quadrant of right breast of female, estrogen receptor positive ([place]) Staging form: Breast, AJCC 8th Edition - Clinical stage from 05/11/2020: Stage IA (cT1b, cN0, cM0, G3, ER+, PR-, HER2+) - Signed by Nicholas Lose, MD on 05/11/2020 Stage prefix: Initial diagnosis - Pathologic stage from 06/02/2020: Stage IA (pT1c, pN0, cM0, G3, ER+, PR-, HER2+) - Signed by Gardenia Phlegm, NP on 06/15/2020 Stage prefix: Initial diagnosis Histologic grading system: 3 grade system   SUMMARY OF ONCOLOGIC HISTORY: Oncology History  Malignant neoplasm of lower-outer quadrant of right breast of female, estrogen receptor positive ([place])  05/03/2020 Initial Diagnosis   Screening mammogram showed a 1.0cm mass at the 6 o'clock position in the right breast. Diagnostic mammogram and US showed the 1.2cm mass at the 6 o'clock position in the right breast. Biopsy showed IDC, grade 3, HER-2 positive (3+), ER 15% weak, PR-, Ki67 80%.   05/11/2020 Cancer Staging   Staging form: Breast, AJCC 8th Edition - Clinical stage from 05/11/2020: Stage IA (cT1b, cN0, cM0, G3, ER+, PR-, HER2+) - Signed by Nicholas Lose, MD on 05/11/2020  Stage prefix: Initial diagnosis    05/19/2020 Genetic Testing   Negative hereditary cancer genetic testing: no pathogenic variants detected in [facility] Breast STAT Panel and Common Hereditary Cancers Panel.  The report dates are May 19, 2020 (STAT) and May 23, 2020 (Common Hereditary).   The Common Hereditary Cancers Panel offered by [facility] includes sequencing and/or deletion duplication testing of the following 47 genes: APC, ATM, AXIN2, BARD1, BMPR1A, BRCA1, BRCA2, BRIP1, CDH1, CDK4, CDKN2A (p14ARF), CDKN2A (p16INK4a), CHEK2, CTNNA1, DICER1, EPCAM (Deletion/duplication testing only), GREM1 (promoter region  deletion/duplication testing only), GREM1, HOXB13, KIT, MEN1, MLH1, MSH2, MSH3, MSH6, MUTYH, NBN, NF1, NHTL1, PALB2, PDGFRA, PMS2, POLD1, POLE, PTEN, RAD50, RAD51C, RAD51D, SDHA, SDHB, SDHC, SDHD, SMAD4, SMARCA4. STK11, TP53, TSC1, TSC2, and VHL.  The following genes were evaluated for sequence changes only: SDHA and HOXB13 c.251G>A variant only.es only: SDHA and HOXB13 c.251G>A variant only.   06/02/2020 Surgery   Right lumpectomy ([facility]): invasive and in situ ductal carcinoma, 1.3cm, clear margins, 1 right axillary lymph node negative for carcinoma.   06/02/2020 Cancer Staging   Staging form: Breast, AJCC 8th Edition - Pathologic stage from 06/02/2020: Stage IA (pT1c, pN0, cM0, G3, ER+, PR-, HER2+) - Signed by Gardenia Phlegm, NP on 06/15/2020  Stage prefix: Initial diagnosis  Histologic grading system: 3 grade system    07/08/2020 - 08/05/2020 Chemotherapy          08/12/2020 -  Chemotherapy    Patient is on Treatment Plan: BREAST PACLITAXEL + TRASTUZUMAB Q7D / TRASTUZUMAB Q21D         CURRENT THERAPY: Taxol/Herceptin cycle 7  INTERVAL HISTORY: Pennelope [name] 55 y.o. female returns for evaluation prior to receiving her seventh cycle of weekly Taxol Hercpetin.  She notes that she has some mild achiness in her joints, but nothing compared to how profound her pain was with the first cycle.    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.  She has remained doing desk duties, however she felt tired, and achy this week so she took the week off.  She is grateful that she did so because several of her co-workers were diagnosed with COVID 19.   Pamela [name] notes  that she has occasional blood when she blows her nose, but no frank epistaxis.  She has applied neosporin to her nares, but that hasn't made a significant impact on it.    Pamela [name] is otherwise feeling well.  She specifically denies peripheral neuropathy.     Patient Active  Problem List   Diagnosis Date Noted   Non-intractable vomiting 07/13/2020   Port-A-Cath in place 07/08/2020   Genetic testing 05/20/2020   Family history of breast cancer 05/12/2020   Family history of lung cancer 05/12/2020   Malignant neoplasm of lower-outer quadrant of right breast of female, estrogen receptor positive ([place]) 05/03/2020   Elevated troponin level not due myocardial infarction 09/18/2019   Mixed hyperlipidemia 09/18/2019   Hypertensive emergency 09/17/2019   Primary osteoarthritis of right hip 08/06/2018   Seasonal allergies 06/12/2018   Fibrocystic disease of breast 05/27/2018   Obstructive sleep apnea syndrome, moderate 10/27/2013   Shift work sleep disorder 10/27/2013   Psychic factors associated with diseases classified elsewhere 08/07/2013   High cholesterol 07/21/2013   Essential hypertension 07/21/2013   Snoring 07/21/2013   IUD contraception-inserted 10/01/11 10/01/2011   Class 3 severe obesity due to excess calories with serious comorbidity and body mass index (BMI) of 45.0 to 49.9 in adult ([place]) 09/03/2011    is allergic to pollen extract, latex, codeine, shellfish allergy, and betadine [povidone iodine].  MEDICAL HISTORY: Past Medical History:  Diagnosis Date   Asthma    DUB (dysfunctional uterine bleeding) 2007   Elevated cholesterol    Family history of breast cancer 05/12/2020   Family history of lung cancer 05/12/2020   H/O menorrhagia 05/01/2006   History of ovarian cyst 2007   Hypertension 03/31/2005   Increased BMI 07/11/2006   Migraine    N&V (nausea and vomiting)    Obesity 2007   Obstructive sleep apnea syndrome, moderate 10/27/2013   no cpap   Weight loss 07/11/2006    SURGICAL HISTORY: Past Surgical History:  Procedure Laterality Date   BREAST LUMPECTOMY WITH RADIOACTIVE SEED AND SENTINEL LYMPH NODE BIOPSY Right 06/02/2020   Procedure: RIGHT BREAST LUMPECTOMY WITH RADIOACTIVE SEED AND RIGHT SENTINEL LYMPH NODE BIOPSY;  Surgeon: [name], MD;  Location: [facility];  Service: General;  Laterality: Right;   BREAST REDUCTION SURGERY  05/1991   CESAREAN SECTION     HYSTEROSCOPY  05/01/2006   PORTACATH PLACEMENT Right 06/02/2020   Procedure: INSERTION PORT-A-CATH;  Surgeon: [name], MD;  Location: [facility];  Service: General;  Laterality: Right;    SOCIAL HISTORY: Social History   Socioeconomic History   Marital status: Single    Spouse name: Not on file   Number of children: Not on file   Years of education: Not on file   Highest education level: Not on file  Occupational History   Not on file  Tobacco Use   Smoking status: Never   Smokeless tobacco: Never  Vaping Use   Vaping Use: Never used  Substance and Sexual Activity   Alcohol use: No   Drug use: Not Currently    Frequency: 2.0 times per week   Sexual activity: Never    Birth control/protection: I.U.D.    Comment: Mirena  Other Topics Concern   Not on file  Social History Narrative   Not on file   Social Determinants of Health   Financial Resource Strain: Not on file  Food Insecurity: Not on file  Transportation Needs: Not on file  Physical Activity: Not  on file  Stress: Not on file  Social Connections: Not on file  Intimate Partner Violence: Not on file    FAMILY HISTORY: Family History  Problem Relation Age of Onset   Diabetes Maternal Grandmother    Hypertension Mother    Hypertension Father    Heart attack Father    Bone cancer Maternal Grandfather        dx after 7   Breast cancer Maternal Aunt        dx 52s   Lung cancer Maternal Aunt        dx after 50    Review of Systems  Constitutional:  Positive for fatigue. Negative for appetite change, chills, fever and unexpected weight change.  HENT:   Negative for hearing loss, lump/mass and trouble swallowing.   Eyes:  Negative for eye problems and icterus.  Respiratory:  Negative for chest tightness, cough and shortness of breath.    Cardiovascular:  Negative for chest pain, leg swelling and palpitations.  Gastrointestinal:  Negative for abdominal distention, abdominal pain, constipation, diarrhea, nausea and vomiting.  Endocrine: Negative for hot flashes.  Genitourinary:  Negative for difficulty urinating.   Musculoskeletal:  Positive for arthralgias.  Skin:  Negative for itching and rash.  Neurological:  Negative for dizziness, extremity weakness, headaches and numbness.  Hematological:  Negative for adenopathy. Does not bruise/bleed easily.  Psychiatric/Behavioral:  Negative for depression. The patient is not nervous/anxious.      PHYSICAL EXAMINATION  ECOG PERFORMANCE STATUS: 1 - Symptomatic but completely ambulatory  Vitals:   09/16/20 1322  BP: 137/63  Pulse: 83  Resp: 18  Temp: 97.7 F (36.5 C)  SpO2: 100%   Patient appears well, in no  apparent distress, breathing is non labored, skin visualized without rash or lesion, mood and behavior are normal.     LABORATORY DATA:  CBC    Component Value Date/Time   WBC 8.1 09/16/2020 1249   WBC 8.9 02/22/2020 1529   RBC 3.89 09/16/2020 1249   HGB 10.5 (L) 09/16/2020 1249   HGB 13.2 09/22/2019 1040   HCT 31.9 (L) 09/16/2020 1249   HCT 41.3 09/22/2019 1040   PLT 294 09/16/2020 1249   PLT 247 09/22/2019 1040   MCV 82.0 09/16/2020 1249   MCV 83 09/22/2019 1040   MCH 27.0 09/16/2020 1249   MCHC 32.9 09/16/2020 1249   RDW 17.3 (H) 09/16/2020 1249   RDW 14.7 09/22/2019 1040   LYMPHSABS 1.4 09/16/2020 1249   MONOABS 0.5 09/16/2020 1249   EOSABS 0.1 09/16/2020 1249   BASOSABS 0.1 09/16/2020 1249    CMP     Component Value Date/Time   NA 140 09/16/2020 1249   NA 142 09/22/2019 1040   K 3.8 09/16/2020 1249   CL 103 09/16/2020 1249   CO2 31 09/16/2020 1249   GLUCOSE 95 09/16/2020 1249   BUN 13 09/16/2020 1249   BUN 11 09/22/2019 1040   CREATININE 0.85 09/16/2020 1249   CALCIUM 10.0 09/16/2020 1249   PROT 7.1 09/16/2020 1249   PROT 6.7  06/17/2019 1054   ALBUMIN 3.7 09/16/2020 1249   ALBUMIN 4.5 06/17/2019 1054   AST 16 09/16/2020 1249   ALT 20 09/16/2020 1249   ALKPHOS 64 09/16/2020 1249   BILITOT 0.9 09/16/2020 1249   GFRNONAA >60 09/16/2020 1249   GFRAA 77 09/22/2019 1040         ASSESSMENT and THERAPY PLAN:   Malignant neoplasm of lower-outer quadrant of right breast of female, estrogen receptor  positive ([place]) positive ([place]) 05/03/2020:Screening mammogram showed a 1.0cm mass at the 6 o'clock position in the right breast. Diagnostic mammogram and US showed the 1.2cm mass at the 6 o'clock position in the right breast. Biopsy showed IDC, grade 3, HER-2 positive (3+), ER 15% weak, PR-, Ki67 80%.   06/02/2020:Right lumpectomy ([facility]): invasive and in situ ductal carcinoma, 1.3cm, clear margins, 1 right axillary lymph node negative for carcinoma. ER 15% weak, PR-,HER-2 positive (3+) Ki67 80%.   Treatment plan: 1.  Adjuvant chemotherapy with Taxol Herceptin followed by Herceptin maintenance. Initially had significant pain with 80 mg/m2, held x 3 weeks then dose reduced to 50 mg/m2 2.  Adjuvant radiation therapy 3.  Adjuvant antiestrogen therapy ----------------------------------------------------------------------------------------------------- Current Treatment:Cycle 7 Taxol-Herceptin ECHO 05/20/20: EF 60-65%   Chemo Toxicities:  Fatigue: stable Anemia: stable Profound achiness: much improved since dose reduction, will monitor  She has not developed any peripheral neuropathy.  She reviewed port concerns with me and I have asked Rodney Langton, our very experienced nurse, who is giving her chemotherapy today for his ideas on how to make this a better experience for her.      RTC weekly with Taxol and every other week for follow-[name]       The patient was provided an opportunity to ask questions and all were answered. The patient agreed with the plan and demonstrated an understanding of the instructions.   The  patient was advised to call back or seek an in-person evaluation if the symptoms worsen or if the condition fails to improve as anticipated.   I provided 30 minutes of face-to-face video visit time during this encounter, and > 50% was spent counseling as documented under my assessment & plan.  Wilber Bihari, NP 09/16/20 1:56 PM Medical Oncology and Hematology [facility] [address], [place] [address] Tel. [phone]    Fax. [phone]  *Total Encounter Time as defined by the Centers for Medicare and Medicaid Services includes, in addition to the face-to-face time of a patient visit (documented in the note above) non-face-to-face time: obtaining and reviewing outside history, ordering and reviewing medications, tests or procedures, care coordination (communications with other health care professionals or caregivers) and documentation in the medical record.

## 2020-09-15 NOTE — Progress Notes (Signed)
Pamela [name]:    [name], [name] [place] [address]   DIAGNOSIS: Cancer Staging Malignant neoplasm of lower-outer quadrant of right breast of female, estrogen receptor positive ([name]) Staging form: Breast, AJCC 8th Edition - Clinical stage from 05/11/2020: Stage IA (cT1b, cN0, cM0, G3, ER+, PR-, HER2+) - Signed by Nicholas Lose, MD on 05/11/2020 Stage prefix: Initial diagnosis - Pathologic stage from 06/02/2020: Stage IA (pT1c, pN0, cM0, G3, ER+, PR-, HER2+) - Signed by Gardenia Phlegm, NP on 06/15/2020 Stage prefix: Initial diagnosis Histologic grading system: 3 grade system   SUMMARY OF ONCOLOGIC HISTORY: Oncology History  Malignant neoplasm of lower-outer quadrant of right breast of female, estrogen receptor positive ([name])  05/03/2020 Initial Diagnosis   Screening mammogram showed a 1.0cm mass at the 6 o'clock position in the right breast. Diagnostic mammogram and US showed the 1.2cm mass at the 6 o'clock position in the right breast. Biopsy showed IDC, grade 3, HER-2 positive (3+), ER 15% weak, PR-, Ki67 80%.   05/11/2020 Cancer Staging   Staging form: Breast, AJCC 8th Edition - Clinical stage from 05/11/2020: Stage IA (cT1b, cN0, cM0, G3, ER+, PR-, HER2+) - Signed by Nicholas Lose, MD on 05/11/2020  Stage prefix: Initial diagnosis    05/19/2020 Genetic Testing   Negative hereditary cancer genetic testing: no pathogenic variants detected in [facility] Breast STAT Panel and Common Hereditary Cancers Panel.  The report dates are May 19, 2020 (STAT) and May 23, 2020 (Common Hereditary).   The Common Hereditary Cancers Panel offered by [facility] includes sequencing and/or deletion duplication testing of the following 47 genes: APC, ATM, AXIN2, BARD1, BMPR1A, BRCA1, BRCA2, BRIP1, CDH1, CDK4, CDKN2A (p14ARF), CDKN2A (p16INK4a), CHEK2, CTNNA1, DICER1, EPCAM (Deletion/duplication testing only), GREM1 (promoter region  deletion/duplication testing only), GREM1, HOXB13, KIT, MEN1, MLH1, MSH2, MSH3, MSH6, MUTYH, NBN, NF1, NHTL1, PALB2, PDGFRA, PMS2, POLD1, POLE, PTEN, RAD50, RAD51C, RAD51D, SDHA, SDHB, SDHC, SDHD, SMAD4, SMARCA4. STK11, TP53, TSC1, TSC2, and VHL.  The following genes were evaluated for sequence changes only: SDHA and HOXB13 c.251G>A variant only.es only: SDHA and HOXB13 c.251G>A variant only.   06/02/2020 Surgery   Right lumpectomy ([facility]): invasive and in situ ductal carcinoma, 1.3cm, clear margins, 1 right axillary lymph node negative for carcinoma.   06/02/2020 Cancer Staging   Staging form: Breast, AJCC 8th Edition - Pathologic stage from 06/02/2020: Stage IA (pT1c, pN0, cM0, G3, ER+, PR-, HER2+) - Signed by Gardenia Phlegm, NP on 06/15/2020  Stage prefix: Initial diagnosis  Histologic grading system: 3 grade system    07/08/2020 - 08/05/2020 Chemotherapy          08/12/2020 -  Chemotherapy    Patient is on Treatment Plan: BREAST PACLITAXEL + TRASTUZUMAB Q7D / TRASTUZUMAB Q21D         CURRENT THERAPY: Cycle 8 Taxol/Herceptin  INTERVAL HISTORY: Pamela [name] 55 y.o. female returns for evaluation prior to receiving cycle 6 of Taxol/Herceptin.  She says that she is tolerating the treatment well.  She had one episode of numbness and tingling in the pads of her fingertips which has since resolved.  She denies any significant mucositis, nausea, vomiting, or any other concerns.  She is mildly fatigued and has been managing this with energy conservation.   Her most recent echo was completed on 08/30/2020 and showed an EF of 60-65%.   Patient Active Problem List   Diagnosis Date Noted   Non-intractable vomiting 07/13/2020   Port-A-Cath in place  07/08/2020   Genetic testing 05/20/2020   Family history of breast cancer 05/12/2020   Family history of lung cancer 05/12/2020   Malignant neoplasm of lower-outer quadrant of right breast of female, estrogen receptor positive (Pamela [name])  05/03/2020   Elevated troponin level not due myocardial infarction 09/18/2019   Mixed hyperlipidemia 09/18/2019   Hypertensive emergency 09/17/2019   Primary osteoarthritis of right hip 08/06/2018   Seasonal allergies 06/12/2018   Fibrocystic disease of breast 05/27/2018   Obstructive sleep apnea syndrome, moderate 10/27/2013   Shift work sleep disorder 10/27/2013   Psychic factors associated with diseases classified elsewhere 08/07/2013   High cholesterol 07/21/2013   Essential hypertension 07/21/2013   Snoring 07/21/2013   IUD contraception-inserted 10/01/11 10/01/2011   Class 3 severe obesity due to excess calories with serious comorbidity and body mass index (BMI) of 45.0 to 49.9 in adult ([name]) 09/03/2011    is allergic to pollen extract, latex, codeine, shellfish allergy, and betadine [povidone iodine].  MEDICAL HISTORY: Past Medical History:  Diagnosis Date   Asthma    DUB (dysfunctional uterine bleeding) 2007   Elevated cholesterol    Family history of breast cancer 05/12/2020   Family history of lung cancer 05/12/2020   H/O menorrhagia 05/01/2006   History of ovarian cyst 2007   Hypertension 03/31/2005   Increased BMI 07/11/2006   Migraine    N&V (nausea and vomiting)    Obesity 2007   Obstructive sleep apnea syndrome, moderate 10/27/2013   no cpap   Weight loss 07/11/2006    SURGICAL HISTORY: Past Surgical History:  Procedure Laterality Date   BREAST LUMPECTOMY WITH RADIOACTIVE SEED AND SENTINEL LYMPH NODE BIOPSY Right 06/02/2020   Procedure: RIGHT BREAST LUMPECTOMY WITH RADIOACTIVE SEED AND RIGHT SENTINEL LYMPH NODE BIOPSY;  Surgeon: [name], MD;  Location: [facility];  Service: General;  Laterality: Right;   BREAST REDUCTION SURGERY  05/1991   CESAREAN SECTION     HYSTEROSCOPY  05/01/2006   PORTACATH PLACEMENT Right 06/02/2020   Procedure: INSERTION PORT-A-CATH;  Surgeon: [name], MD;  Location: [facility];  Service: General;   Laterality: Right;    SOCIAL HISTORY: Social History   Socioeconomic History   Marital status: Single    Spouse name: Not on file   Number of children: Not on file   Years of education: Not on file   Highest education level: Not on file  Occupational History   Not on file  Tobacco Use   Smoking status: Never   Smokeless tobacco: Never  Vaping Use   Vaping Use: Never used  Substance and Sexual Activity   Alcohol use: No   Drug use: Not Currently    Frequency: 2.0 times per week   Sexual activity: Never    Birth control/protection: I.U.D.    Comment: Mirena  Other Topics Concern   Not on file  Social History Narrative   Not on file   Social Determinants of Health   Financial Resource Strain: Not on file  Food Insecurity: Not on file  Transportation Needs: Not on file  Physical Activity: Not on file  Stress: Not on file  Social Connections: Not on file  Intimate Partner Violence: Not on file    FAMILY HISTORY: Family History  Problem Relation Age of Onset   Diabetes Maternal Grandmother    Hypertension Mother    Hypertension Father    Heart attack Father    Bone cancer Maternal Grandfather  dx after 58   Breast cancer Maternal Aunt        dx 16s   Lung cancer Maternal Aunt        dx after 50    Review of Systems  Constitutional:  Positive for fatigue. Negative for appetite change, chills, fever and unexpected weight change.  HENT:   Negative for hearing loss, lump/mass and trouble swallowing.   Eyes:  Negative for eye problems and icterus.  Respiratory:  Negative for chest tightness, cough and shortness of breath.   Cardiovascular:  Negative for chest pain, leg swelling and palpitations.  Gastrointestinal:  Negative for abdominal distention, abdominal pain, constipation, diarrhea, nausea and vomiting.  Endocrine: Negative for hot flashes.  Genitourinary:  Negative for difficulty urinating.   Musculoskeletal:  Negative for arthralgias.  Skin:   Negative for itching and rash.  Neurological:  Negative for dizziness, extremity weakness, headaches and numbness.  Hematological:  Negative for adenopathy. Does not bruise/bleed easily.  Psychiatric/Behavioral:  Negative for depression. The patient is not nervous/anxious.      PHYSICAL EXAMINATION  ECOG PERFORMANCE STATUS: 1 - Symptomatic but completely ambulatory  There were no vitals filed for this visit. See [facility] vitals taken in chemo room.  Physical Exam Constitutional:      General: She is not in acute distress.    Appearance: Normal appearance. She is not toxic-appearing.  HENT:     Head: Normocephalic and atraumatic.  Eyes:     General: No scleral icterus. Cardiovascular:     Rate and Rhythm: Normal rate and regular rhythm.     Pulses: Normal pulses.     Heart sounds: Normal heart sounds.  Pulmonary:     Effort: Pulmonary effort is normal.     Breath sounds: Normal breath sounds.  Abdominal:     General: Abdomen is flat. Bowel sounds are normal. There is no distension.     Palpations: Abdomen is soft.     Tenderness: There is no abdominal tenderness.  Musculoskeletal:        General: No swelling.     Cervical back: Neck supple.  Lymphadenopathy:     Cervical: No cervical adenopathy.  Skin:    General: Skin is warm and dry.     Findings: No rash.  Neurological:     General: No focal deficit present.     Mental Status: She is alert.  Psychiatric:        Mood and Affect: Mood normal.        Behavior: Behavior normal.    LABORATORY DATA:  CBC    Component Value Date/Time   WBC 7.9 09/23/2020 0957   WBC 8.9 02/22/2020 1529   RBC 3.97 09/23/2020 0957   HGB 10.6 (L) 09/23/2020 0957   HGB 13.2 09/22/2019 1040   HCT 33.6 (L) 09/23/2020 0957   HCT 41.3 09/22/2019 1040   PLT 318 09/23/2020 0957   PLT 247 09/22/2019 1040   MCV 84.6 09/23/2020 0957   MCV 83 09/22/2019 1040   MCH 26.7 09/23/2020 0957   MCHC 31.5 09/23/2020 0957   RDW 17.7 (H) 09/23/2020 0957    RDW 14.7 09/22/2019 1040   LYMPHSABS 2.2 09/23/2020 0957   MONOABS 0.6 09/23/2020 0957   EOSABS 0.1 09/23/2020 0957   BASOSABS 0.1 09/23/2020 0957    CMP     Component Value Date/Time   NA 143 09/23/2020 0957   NA 142 09/22/2019 1040   K 3.6 09/23/2020 0957   CL 100 09/23/2020 0957  CO2 32 09/23/2020 0957   GLUCOSE 113 (H) 09/23/2020 0957   BUN 11 09/23/2020 0957   BUN 11 09/22/2019 1040   CREATININE 0.73 09/23/2020 0957   CALCIUM 9.2 09/23/2020 0957   PROT 6.9 09/23/2020 0957   PROT 6.7 06/17/2019 1054   ALBUMIN 4.0 09/23/2020 0957   ALBUMIN 4.5 06/17/2019 1054   AST 20 09/23/2020 0957   ALT 24 09/23/2020 0957   ALKPHOS 52 09/23/2020 0957   BILITOT 0.7 09/23/2020 0957   GFRNONAA >60 09/23/2020 0957   GFRAA 77 09/22/2019 1040    ASSESSMENT and THERAPY PLAN:   Malignant neoplasm of lower-outer quadrant of right breast of female, estrogen receptor positive ([place]) positive ([place]) 05/03/2020:Screening mammogram showed a 1.0cm mass at the 6 o'clock position in the right breast. Diagnostic mammogram and US showed the 1.2cm mass at the 6 o'clock position in the right breast. Biopsy showed IDC, grade 3, HER-2 positive (3+), ER 15% weak, PR-, Ki67 80%.   06/02/2020:Right lumpectomy ([facility]): invasive and in situ ductal carcinoma, 1.3cm, clear margins, 1 right axillary lymph node negative for carcinoma. ER 15% weak, PR-,HER-2 positive (3+) Ki67 80%.   Treatment plan: 1.  Adjuvant chemotherapy with Taxol Herceptin followed by Herceptin maintenance. Initially had significant pain with 80 mg/m2, held x 3 weeks then dose reduced to 50 mg/m2 2.  Adjuvant radiation therapy 3.  Adjuvant antiestrogen therapy ----------------------------------------------------------------------------------------------------- Current Treatment:Cycle 8 Taxol-Herceptin ECHO 08/30/20: EF 60-65%   Chemo Toxicities:  Fatigue: stable, managing with energy conservation.  Anemia: stable She had very mild  peripheral neuropathy which has since resolved.  She will let me know if this returns, and we will be monitoring it very closely.   RTC weekly with Taxol and every other week for follow-[name]     All questions were answered. The patient knows to call the clinic with any problems, questions or concerns. We can certainly see the patient much sooner if necessary.  Total encounter time: 20 minutes* in face to face visit time, chart review, lab review, and documentation of the encounter.   Wilber Bihari, NP 09/25/20 2:22 PM Medical Oncology and Hematology [facility] [place], [place] [address] Tel. [phone]    Fax. [phone]  *Total Encounter Time as defined by the Centers for Medicare and Medicaid Services includes, in addition to the face-to-face time of a patient visit (documented in the note above) non-face-to-face time: obtaining and reviewing outside history, ordering and reviewing medications, tests or procedures, care coordination (communications with other health care professionals or caregivers) and documentation in the medical record.

## 2020-09-15 NOTE — Assessment & Plan Note (Addendum)
positive ([place]) 05/03/2020:Screening mammogram showed a 1.0cm mass at the 6 o'clock position in the right breast. Diagnostic mammogram and US showed the 1.2cm mass at the 6 o'clock position in the right breast. Biopsy showed IDC, grade 3, HER-2 positive (3+), ER 15% weak, PR-, Ki67 80%.  06/02/2020:Right lumpectomy ([facility]): invasive and in situ ductal carcinoma, 1.3cm, clear margins, 1 right axillary lymph node negative for carcinoma. ER 15% weak, PR-,HER-2 positive (3+) Ki67 80%.  Treatment plan: 1.Adjuvant chemotherapy with Taxol Herceptinfollowed byHerceptin maintenance. Initially had significant pain with 80 mg/m2, held x 3 weeks then dose reduced to 50 mg/m2 2.Adjuvant radiation therapy 3.Adjuvant antiestrogen therapy ----------------------------------------------------------------------------------------------------- Current Treatment:Cycle 7 Taxol-Herceptin ECHO 05/20/20: EF 60-65%  Chemo Toxicities:  Fatigue: stable Anemia: stable Profound achiness: much improved since dose reduction, will monitor  She has not developed any peripheral neuropathy.  She reviewed port concerns with me and I have asked Rodney Langton, our very experienced nurse, who is giving her chemotherapy today for his ideas on how to make this a better experience for her.     RTC weekly with Taxol and every other week for follow-up

## 2020-09-16 ENCOUNTER — Ambulatory Visit: Payer: 59

## 2020-09-16 ENCOUNTER — Other Ambulatory Visit: Payer: Self-pay

## 2020-09-16 ENCOUNTER — Encounter: Payer: Self-pay | Admitting: Adult Health

## 2020-09-16 ENCOUNTER — Inpatient Hospital Stay: Payer: 59

## 2020-09-16 ENCOUNTER — Ambulatory Visit: Payer: 59 | Admitting: Hematology and Oncology

## 2020-09-16 ENCOUNTER — Other Ambulatory Visit: Payer: Self-pay | Admitting: *Deleted

## 2020-09-16 ENCOUNTER — Other Ambulatory Visit: Payer: 59

## 2020-09-16 ENCOUNTER — Inpatient Hospital Stay (HOSPITAL_BASED_OUTPATIENT_CLINIC_OR_DEPARTMENT_OTHER): Payer: 59 | Admitting: Adult Health

## 2020-09-16 VITALS — BP 137/63 | HR 83 | Temp 97.7°F | Resp 18 | Ht 69.0 in | Wt 330.9 lb

## 2020-09-16 DIAGNOSIS — Z5112 Encounter for antineoplastic immunotherapy: Secondary | ICD-10-CM | POA: Diagnosis not present

## 2020-09-16 DIAGNOSIS — C50511 Malignant neoplasm of lower-outer quadrant of right female breast: Secondary | ICD-10-CM

## 2020-09-16 DIAGNOSIS — Z170 Estrogen receptor positive status [ER+]: Secondary | ICD-10-CM

## 2020-09-16 DIAGNOSIS — Z95828 Presence of other vascular implants and grafts: Secondary | ICD-10-CM

## 2020-09-16 LAB — CBC WITH DIFFERENTIAL (CANCER CENTER ONLY)
Abs Immature Granulocytes: 0.19 10*3/uL — ABNORMAL HIGH (ref 0.00–0.07)
Basophils Absolute: 0.1 10*3/uL (ref 0.0–0.1)
Basophils Relative: 1 %
Eosinophils Absolute: 0.1 10*3/uL (ref 0.0–0.5)
Eosinophils Relative: 1 %
HCT: 31.9 % — ABNORMAL LOW (ref 36.0–46.0)
Hemoglobin: 10.5 g/dL — ABNORMAL LOW (ref 12.0–15.0)
Immature Granulocytes: 2 %
Lymphocytes Relative: 18 %
Lymphs Abs: 1.4 10*3/uL (ref 0.7–4.0)
MCH: 27 pg (ref 26.0–34.0)
MCHC: 32.9 g/dL (ref 30.0–36.0)
MCV: 82 fL (ref 80.0–100.0)
Monocytes Absolute: 0.5 10*3/uL (ref 0.1–1.0)
Monocytes Relative: 7 %
Neutro Abs: 5.8 10*3/uL (ref 1.7–7.7)
Neutrophils Relative %: 71 %
Platelet Count: 294 10*3/uL (ref 150–400)
RBC: 3.89 MIL/uL (ref 3.87–5.11)
RDW: 17.3 % — ABNORMAL HIGH (ref 11.5–15.5)
WBC Count: 8.1 10*3/uL (ref 4.0–10.5)
nRBC: 0 % (ref 0.0–0.2)

## 2020-09-16 LAB — CMP (CANCER CENTER ONLY)
ALT: 20 U/L (ref 0–44)
AST: 16 U/L (ref 15–41)
Albumin: 3.7 g/dL (ref 3.5–5.0)
Alkaline Phosphatase: 64 U/L (ref 38–126)
Anion gap: 6 (ref 5–15)
BUN: 13 mg/dL (ref 6–20)
CO2: 31 mmol/L (ref 22–32)
Calcium: 10 mg/dL (ref 8.9–10.3)
Chloride: 103 mmol/L (ref 98–111)
Creatinine: 0.85 mg/dL (ref 0.44–1.00)
GFR, Estimated: >60 mL/min (ref >60)
Glucose, Bld: 95 mg/dL (ref 70–99)
Potassium: 3.8 mmol/L (ref 3.5–5.1)
Sodium: 140 mmol/L (ref 135–145)
Total Bilirubin: 0.9 mg/dL (ref 0.3–1.2)
Total Protein: 7.1 g/dL (ref 6.5–8.1)

## 2020-09-16 MED ORDER — DIPHENHYDRAMINE HCL 50 MG/ML IJ SOLN
INTRAMUSCULAR | Status: AC
  Filled 2020-09-16: qty 1

## 2020-09-16 MED ORDER — DEXAMETHASONE SODIUM PHOSPHATE 100 MG/10ML IJ SOLN
10.0000 mg | Freq: Once | INTRAMUSCULAR | Status: AC
  Administered 2020-09-16: 10 mg via INTRAVENOUS
  Filled 2020-09-16: qty 10

## 2020-09-16 MED ORDER — SODIUM CHLORIDE 0.9 % IV SOLN
50.0000 mg/m2 | Freq: Once | INTRAVENOUS | Status: AC
  Administered 2020-09-16: 138 mg via INTRAVENOUS
  Filled 2020-09-16: qty 23

## 2020-09-16 MED ORDER — CYANOCOBALAMIN 1000 MCG/ML IJ SOLN
1000.0000 ug | Freq: Once | INTRAMUSCULAR | Status: AC
  Administered 2020-09-16: 1000 ug via INTRAMUSCULAR

## 2020-09-16 MED ORDER — CYANOCOBALAMIN 1000 MCG/ML IJ SOLN
INTRAMUSCULAR | Status: AC
  Filled 2020-09-16: qty 1

## 2020-09-16 MED ORDER — TRASTUZUMAB-ANNS CHEMO 150 MG IV SOLR
2.0000 mg/kg | Freq: Once | INTRAVENOUS | Status: AC
  Administered 2020-09-16: 315 mg via INTRAVENOUS
  Filled 2020-09-16: qty 15

## 2020-09-16 MED ORDER — ACETAMINOPHEN 325 MG PO TABS
650.0000 mg | ORAL_TABLET | Freq: Once | ORAL | Status: AC
  Administered 2020-09-16: 650 mg via ORAL

## 2020-09-16 MED ORDER — FAMOTIDINE 20 MG IN NS 100 ML IVPB
20.0000 mg | Freq: Once | INTRAVENOUS | Status: AC
  Administered 2020-09-16: 20 mg via INTRAVENOUS

## 2020-09-16 MED ORDER — DIPHENHYDRAMINE HCL 25 MG PO CAPS
ORAL_CAPSULE | ORAL | Status: AC
  Filled 2020-09-16: qty 1

## 2020-09-16 MED ORDER — SODIUM CHLORIDE 0.9% FLUSH
10.0000 mL | Freq: Once | INTRAVENOUS | Status: AC
Start: 2020-09-16 — End: 2020-09-16
  Administered 2020-09-16: 10 mL
  Filled 2020-09-16: qty 10

## 2020-09-16 MED ORDER — SODIUM CHLORIDE 0.9 % IV SOLN
Freq: Once | INTRAVENOUS | Status: AC
  Filled 2020-09-16: qty 250

## 2020-09-16 MED ORDER — FAMOTIDINE 20 MG IN NS 100 ML IVPB
INTRAVENOUS | Status: AC
  Filled 2020-09-16: qty 100

## 2020-09-16 MED ORDER — SODIUM CHLORIDE 0.9% FLUSH
10.0000 mL | INTRAVENOUS | Status: DC | PRN
Start: 2020-09-16 — End: 2020-09-16
  Administered 2020-09-16: 10 mL
  Filled 2020-09-16: qty 10

## 2020-09-16 MED ORDER — DIPHENHYDRAMINE HCL 50 MG/ML IJ SOLN
25.0000 mg | Freq: Once | INTRAMUSCULAR | Status: AC
  Administered 2020-09-16: 25 mg via INTRAVENOUS

## 2020-09-16 MED ORDER — HEPARIN SOD (PORK) LOCK FLUSH 100 UNIT/ML IV SOLN
500.0000 [IU] | Freq: Once | INTRAVENOUS | Status: AC | PRN
  Administered 2020-09-16: 500 [IU]
  Filled 2020-09-16: qty 5

## 2020-09-16 MED ORDER — ACETAMINOPHEN 325 MG PO TABS
ORAL_TABLET | ORAL | Status: AC
  Filled 2020-09-16: qty 2

## 2020-09-16 NOTE — Patient Instructions (Signed)
[facility]   Discharge Instructions: Thank you for choosing [facility] to provide your oncology and hematology care.   If you have a lab appointment with the [facility], please go directly to the [facility] and check in at the registration area.   Wear comfortable clothing and clothing appropriate for easy access to any Portacath or PICC line.   We strive to give you quality time with your provider. You may need to reschedule your appointment if you arrive late (15 or more minutes).  Arriving late affects you and other patients whose appointments are after yours.  Also, if you miss three or more appointments without notifying the office, you may be dismissed from the clinic at the provider's discretion.      For prescription refill requests, have your pharmacy contact our office and allow 72 hours for refills to be completed.    Today you received the following chemotherapy and/or immunotherapy agents: trastuzumab and paclitaxel.      To help prevent nausea and vomiting after your treatment, we encourage you to take your nausea medication as directed.  BELOW ARE SYMPTOMS THAT SHOULD BE REPORTED IMMEDIATELY: *FEVER GREATER THAN 100.4 F (38 C) OR HIGHER *CHILLS OR SWEATING *NAUSEA AND VOMITING THAT IS NOT CONTROLLED WITH YOUR NAUSEA MEDICATION *UNUSUAL SHORTNESS OF BREATH *UNUSUAL BRUISING OR BLEEDING *URINARY PROBLEMS (pain or burning when urinating, or frequent urination) *BOWEL PROBLEMS (unusual diarrhea, constipation, pain near the anus) TENDERNESS IN MOUTH AND THROAT WITH OR WITHOUT PRESENCE OF ULCERS (sore throat, sores in mouth, or a toothache) UNUSUAL RASH, SWELLING OR PAIN  UNUSUAL VAGINAL DISCHARGE OR ITCHING   Items with * indicate a potential emergency and should be followed up as soon as possible or go to the Emergency Department if any problems should occur.  Please show the CHEMOTHERAPY ALERT CARD or IMMUNOTHERAPY ALERT  CARD at check-in to the Emergency Department and triage nurse.  Should you have questions after your visit or need to cancel or reschedule your appointment, please contact [name]  Dept: [phone]  and follow the prompts.  Office hours are 8:00 a.m. to 4:30 p.m. Monday - Friday. Please note that voicemails left after 4:00 p.m. may not be returned until the following business day.  We are closed weekends and major holidays. You have access to a nurse at all times for urgent questions. Please call the main number to the clinic Dept: [phone] and follow the prompts.   For any non-urgent questions, you may also contact your provider using MyChart. We now offer e-Visits for anyone 69 and older to request care online for non-urgent symptoms. For details visit [email or web address].   Also download the MyChart app! Go to the app store, search "MyChart", open the app, select [facility], and log in with your MyChart username and password.  Due to Covid, a mask is required upon entering the hospital/clinic. If you do not have a mask, one will be given to you upon arrival. For doctor visits, patients may have 1 support person aged 43 or older with them. For treatment visits, patients cannot have anyone with them due to current Covid guidelines and our immunocompromised population.

## 2020-09-16 NOTE — Patient Instructions (Signed)

## 2020-09-19 ENCOUNTER — Telehealth: Payer: Self-pay

## 2020-09-19 NOTE — Telephone Encounter (Signed)
Pt called stating after she had infusion Friday she began feeling neuropathy over the weekend. Per MD, we will watch sx and see if they worsen. She sees Mendel Ryder 7/22. Pt verbalized thanks and understanding.

## 2020-09-22 ENCOUNTER — Encounter (HOSPITAL_BASED_OUTPATIENT_CLINIC_OR_DEPARTMENT_OTHER): Payer: 59 | Admitting: Cardiovascular Disease

## 2020-09-23 ENCOUNTER — Inpatient Hospital Stay (HOSPITAL_BASED_OUTPATIENT_CLINIC_OR_DEPARTMENT_OTHER): Payer: 59 | Admitting: Adult Health

## 2020-09-23 ENCOUNTER — Other Ambulatory Visit: Payer: Self-pay

## 2020-09-23 ENCOUNTER — Inpatient Hospital Stay: Payer: 59

## 2020-09-23 ENCOUNTER — Encounter: Payer: Self-pay | Admitting: Adult Health

## 2020-09-23 VITALS — BP 135/70 | HR 74 | Temp 98.3°F | Resp 18 | Ht 69.0 in | Wt 332.2 lb

## 2020-09-23 DIAGNOSIS — Z5112 Encounter for antineoplastic immunotherapy: Secondary | ICD-10-CM | POA: Diagnosis not present

## 2020-09-23 DIAGNOSIS — Z170 Estrogen receptor positive status [ER+]: Secondary | ICD-10-CM

## 2020-09-23 DIAGNOSIS — C50511 Malignant neoplasm of lower-outer quadrant of right female breast: Secondary | ICD-10-CM

## 2020-09-23 DIAGNOSIS — Z95828 Presence of other vascular implants and grafts: Secondary | ICD-10-CM

## 2020-09-23 LAB — CMP (CANCER CENTER ONLY)
ALT: 24 U/L (ref 0–44)
AST: 20 U/L (ref 15–41)
Albumin: 4 g/dL (ref 3.5–5.0)
Alkaline Phosphatase: 52 U/L (ref 38–126)
Anion gap: 11 (ref 5–15)
BUN: 11 mg/dL (ref 6–20)
CO2: 32 mmol/L (ref 22–32)
Calcium: 9.2 mg/dL (ref 8.9–10.3)
Chloride: 100 mmol/L (ref 98–111)
Creatinine: 0.73 mg/dL (ref 0.44–1.00)
GFR, Estimated: >60 mL/min (ref >60)
Glucose, Bld: 113 mg/dL — ABNORMAL HIGH (ref 70–99)
Potassium: 3.6 mmol/L (ref 3.5–5.1)
Sodium: 143 mmol/L (ref 135–145)
Total Bilirubin: 0.7 mg/dL (ref 0.3–1.2)
Total Protein: 6.9 g/dL (ref 6.5–8.1)

## 2020-09-23 LAB — CBC WITH DIFFERENTIAL (CANCER CENTER ONLY)
Abs Immature Granulocytes: 0.16 10*3/uL — ABNORMAL HIGH (ref 0.00–0.07)
Basophils Absolute: 0.1 10*3/uL (ref 0.0–0.1)
Basophils Relative: 1 %
Eosinophils Absolute: 0.1 10*3/uL (ref 0.0–0.5)
Eosinophils Relative: 2 %
HCT: 33.6 % — ABNORMAL LOW (ref 36.0–46.0)
Hemoglobin: 10.6 g/dL — ABNORMAL LOW (ref 12.0–15.0)
Immature Granulocytes: 2 %
Lymphocytes Relative: 28 %
Lymphs Abs: 2.2 10*3/uL (ref 0.7–4.0)
MCH: 26.7 pg (ref 26.0–34.0)
MCHC: 31.5 g/dL (ref 30.0–36.0)
MCV: 84.6 fL (ref 80.0–100.0)
Monocytes Absolute: 0.6 10*3/uL (ref 0.1–1.0)
Monocytes Relative: 7 %
Neutro Abs: 4.7 10*3/uL (ref 1.7–7.7)
Neutrophils Relative %: 60 %
Platelet Count: 318 10*3/uL (ref 150–400)
RBC: 3.97 MIL/uL (ref 3.87–5.11)
RDW: 17.7 % — ABNORMAL HIGH (ref 11.5–15.5)
WBC Count: 7.9 10*3/uL (ref 4.0–10.5)
nRBC: 0 % (ref 0.0–0.2)

## 2020-09-23 MED ORDER — DIPHENHYDRAMINE HCL 50 MG/ML IJ SOLN
25.0000 mg | Freq: Once | INTRAMUSCULAR | Status: AC
  Administered 2020-09-23: 25 mg via INTRAVENOUS

## 2020-09-23 MED ORDER — CYANOCOBALAMIN 1000 MCG/ML IJ SOLN
INTRAMUSCULAR | Status: AC
  Filled 2020-09-23: qty 1

## 2020-09-23 MED ORDER — ACETAMINOPHEN 325 MG PO TABS
650.0000 mg | ORAL_TABLET | Freq: Once | ORAL | Status: AC
  Administered 2020-09-23: 650 mg via ORAL

## 2020-09-23 MED ORDER — CYANOCOBALAMIN 1000 MCG/ML IJ SOLN
1000.0000 ug | Freq: Once | INTRAMUSCULAR | Status: AC
  Administered 2020-09-23: 1000 ug via INTRAMUSCULAR

## 2020-09-23 MED ORDER — SODIUM CHLORIDE 0.9 % IV SOLN
10.0000 mg | Freq: Once | INTRAVENOUS | Status: AC
  Administered 2020-09-23: 10 mg via INTRAVENOUS
  Filled 2020-09-23: qty 10

## 2020-09-23 MED ORDER — SODIUM CHLORIDE 0.9% FLUSH
10.0000 mL | Freq: Once | INTRAVENOUS | Status: AC
  Administered 2020-09-23: 10 mL
  Filled 2020-09-23: qty 10

## 2020-09-23 MED ORDER — TRASTUZUMAB-ANNS CHEMO 150 MG IV SOLR
300.0000 mg | Freq: Once | INTRAVENOUS | Status: AC
  Administered 2020-09-23: 300 mg via INTRAVENOUS
  Filled 2020-09-23: qty 14.29

## 2020-09-23 MED ORDER — DIPHENHYDRAMINE HCL 50 MG/ML IJ SOLN
INTRAMUSCULAR | Status: AC
  Filled 2020-09-23: qty 1

## 2020-09-23 MED ORDER — SODIUM CHLORIDE 0.9 % IV SOLN
Freq: Once | INTRAVENOUS | Status: AC
  Filled 2020-09-23: qty 250

## 2020-09-23 MED ORDER — SODIUM CHLORIDE 0.9 % IV SOLN
50.0000 mg/m2 | Freq: Once | INTRAVENOUS | Status: AC
  Administered 2020-09-23: 138 mg via INTRAVENOUS
  Filled 2020-09-23: qty 23

## 2020-09-23 MED ORDER — ACETAMINOPHEN 325 MG PO TABS
ORAL_TABLET | ORAL | Status: AC
  Filled 2020-09-23: qty 2

## 2020-09-23 MED ORDER — SODIUM CHLORIDE 0.9% FLUSH
10.0000 mL | INTRAVENOUS | Status: DC | PRN
  Administered 2020-09-23: 10 mL
  Filled 2020-09-23: qty 10

## 2020-09-23 MED ORDER — FAMOTIDINE 20 MG IN NS 100 ML IVPB
20.0000 mg | Freq: Once | INTRAVENOUS | Status: AC
  Administered 2020-09-23: 20 mg via INTRAVENOUS

## 2020-09-23 MED ORDER — HEPARIN SOD (PORK) LOCK FLUSH 100 UNIT/ML IV SOLN
500.0000 [IU] | Freq: Once | INTRAVENOUS | Status: AC | PRN
  Administered 2020-09-23: 500 [IU]
  Filled 2020-09-23: qty 5

## 2020-09-23 MED ORDER — FAMOTIDINE 20 MG IN NS 100 ML IVPB
INTRAVENOUS | Status: AC
  Filled 2020-09-23: qty 100

## 2020-09-23 NOTE — Patient Instructions (Signed)
[facility]   Discharge Instructions: Thank you for choosing [facility] to provide your oncology and hematology care.   If you have a lab appointment with the [facility], please go directly to the [facility] and check in at the registration area.   Wear comfortable clothing and clothing appropriate for easy access to any Portacath or PICC line.   We strive to give you quality time with your provider. You may need to reschedule your appointment if you arrive late (15 or more minutes).  Arriving late affects you and other patients whose appointments are after yours.  Also, if you miss three or more appointments without notifying the office, you may be dismissed from the clinic at the provider's discretion.      For prescription refill requests, have your pharmacy contact our office and allow 72 hours for refills to be completed.    Today you received the following chemotherapy and/or immunotherapy agents: Trastuzumab (Herceptin) and Paclitaxel (Taxol)      To help prevent nausea and vomiting after your treatment, we encourage you to take your nausea medication as directed.  BELOW ARE SYMPTOMS THAT SHOULD BE REPORTED IMMEDIATELY: *FEVER GREATER THAN 100.4 F (38 C) OR HIGHER *CHILLS OR SWEATING *NAUSEA AND VOMITING THAT IS NOT CONTROLLED WITH YOUR NAUSEA MEDICATION *UNUSUAL SHORTNESS OF BREATH *UNUSUAL BRUISING OR BLEEDING *URINARY PROBLEMS (pain or burning when urinating, or frequent urination) *BOWEL PROBLEMS (unusual diarrhea, constipation, pain near the anus) TENDERNESS IN MOUTH AND THROAT WITH OR WITHOUT PRESENCE OF ULCERS (sore throat, sores in mouth, or a toothache) UNUSUAL RASH, SWELLING OR PAIN  UNUSUAL VAGINAL DISCHARGE OR ITCHING   Items with * indicate a potential emergency and should be followed up as soon as possible or go to the Emergency Department if any problems should occur.  Please show the CHEMOTHERAPY ALERT CARD or  IMMUNOTHERAPY ALERT CARD at check-in to the Emergency Department and triage nurse.  Should you have questions after your visit or need to cancel or reschedule your appointment, please contact [facility]  Dept: [phone]  and follow the prompts.  Office hours are 8:00 a.m. to 4:30 p.m. Monday - Friday. Please note that voicemails left after 4:00 p.m. may not be returned until the following business day.  We are closed weekends and major holidays. You have access to a nurse at all times for urgent questions. Please call the main number to the clinic Dept: [phone] and follow the prompts.   For any non-urgent questions, you may also contact your provider using MyChart. We now offer e-Visits for anyone 54 and older to request care online for non-urgent symptoms. For details visit [email or web address].   Also download the MyChart app! Go to the app store, search "MyChart", open the app, select [facility], and log in with your MyChart username and password.  Due to Covid, a mask is required upon entering the hospital/clinic. If you do not have a mask, one will be given to you upon arrival. For doctor visits, patients may have 1 support person aged 54 or older with them. For treatment visits, patients cannot have anyone with them due to current Covid guidelines and our immunocompromised population.

## 2020-09-25 ENCOUNTER — Encounter: Payer: Self-pay | Admitting: Hematology and Oncology

## 2020-09-25 NOTE — Assessment & Plan Note (Addendum)
positive ([facility]) 05/03/2020:Screening mammogram showed a 1.0cm mass at the 6 o'clock position in the right breast. Diagnostic mammogram and US showed the 1.2cm mass at the 6 o'clock position in the right breast. Biopsy showed IDC, grade 3, HER-2 positive (3+), ER 15% weak, PR-, Ki67 80%.  06/02/2020:Right lumpectomy ([facility]): invasive and in situ ductal carcinoma, 1.3cm, clear margins, 1 right axillary lymph node negative for carcinoma. ER 15% weak, PR-,HER-2 positive (3+) Ki67 80%.  Treatment plan: 1.Adjuvant chemotherapy with Taxol Herceptinfollowed byHerceptin maintenance. Initially had significant pain with 80 mg/m2, held x 3 weeks then dose reduced to 50 mg/m2 2.Adjuvant radiation therapy 3.Adjuvant antiestrogen therapy ----------------------------------------------------------------------------------------------------- Current Treatment:Cycle 8 Taxol-Herceptin ECHO 08/30/20: EF 60-65%  Chemo Toxicities:  Fatigue: stable, managing with energy conservation.  Anemia: stable She had very mild peripheral neuropathy which has since resolved.  She will let me know if this returns, and we will be monitoring it very closely.  RTC weekly with Taxol and every other week for follow-up

## 2020-09-29 ENCOUNTER — Inpatient Hospital Stay: Admission: RE | Admit: 2020-09-29 | Payer: Self-pay | Source: Ambulatory Visit

## 2020-09-30 ENCOUNTER — Inpatient Hospital Stay: Payer: 59

## 2020-09-30 ENCOUNTER — Other Ambulatory Visit: Payer: Self-pay

## 2020-09-30 VITALS — BP 140/76 | HR 80 | Temp 98.2°F | Resp 18 | Wt 330.1 lb

## 2020-09-30 DIAGNOSIS — C50511 Malignant neoplasm of lower-outer quadrant of right female breast: Secondary | ICD-10-CM

## 2020-09-30 DIAGNOSIS — Z170 Estrogen receptor positive status [ER+]: Secondary | ICD-10-CM

## 2020-09-30 DIAGNOSIS — Z95828 Presence of other vascular implants and grafts: Secondary | ICD-10-CM

## 2020-09-30 DIAGNOSIS — Z5112 Encounter for antineoplastic immunotherapy: Secondary | ICD-10-CM | POA: Diagnosis not present

## 2020-09-30 LAB — CBC WITH DIFFERENTIAL (CANCER CENTER ONLY)
Abs Immature Granulocytes: 0.21 10*3/uL — ABNORMAL HIGH (ref 0.00–0.07)
Basophils Absolute: 0 10*3/uL (ref 0.0–0.1)
Basophils Relative: 1 %
Eosinophils Absolute: 0.1 10*3/uL (ref 0.0–0.5)
Eosinophils Relative: 2 %
HCT: 31.6 % — ABNORMAL LOW (ref 36.0–46.0)
Hemoglobin: 10.3 g/dL — ABNORMAL LOW (ref 12.0–15.0)
Immature Granulocytes: 4 %
Lymphocytes Relative: 26 %
Lymphs Abs: 1.5 10*3/uL (ref 0.7–4.0)
MCH: 27.3 pg (ref 26.0–34.0)
MCHC: 32.6 g/dL (ref 30.0–36.0)
MCV: 83.8 fL (ref 80.0–100.0)
Monocytes Absolute: 0.4 10*3/uL (ref 0.1–1.0)
Monocytes Relative: 7 %
Neutro Abs: 3.6 10*3/uL (ref 1.7–7.7)
Neutrophils Relative %: 60 %
Platelet Count: 268 10*3/uL (ref 150–400)
RBC: 3.77 MIL/uL — ABNORMAL LOW (ref 3.87–5.11)
RDW: 18.3 % — ABNORMAL HIGH (ref 11.5–15.5)
WBC Count: 6 10*3/uL (ref 4.0–10.5)
nRBC: 0 % (ref 0.0–0.2)

## 2020-09-30 LAB — CMP (CANCER CENTER ONLY)
ALT: 24 U/L (ref 0–44)
AST: 18 U/L (ref 15–41)
Albumin: 3.6 g/dL (ref 3.5–5.0)
Alkaline Phosphatase: 58 U/L (ref 38–126)
Anion gap: 9 (ref 5–15)
BUN: 9 mg/dL (ref 6–20)
CO2: 30 mmol/L (ref 22–32)
Calcium: 9.5 mg/dL (ref 8.9–10.3)
Chloride: 103 mmol/L (ref 98–111)
Creatinine: 0.84 mg/dL (ref 0.44–1.00)
GFR, Estimated: >60 mL/min (ref >60)
Glucose, Bld: 113 mg/dL — ABNORMAL HIGH (ref 70–99)
Potassium: 3.6 mmol/L (ref 3.5–5.1)
Sodium: 142 mmol/L (ref 135–145)
Total Bilirubin: 0.8 mg/dL (ref 0.3–1.2)
Total Protein: 6.8 g/dL (ref 6.5–8.1)

## 2020-09-30 MED ORDER — SODIUM CHLORIDE 0.9 % IV SOLN
10.0000 mg | Freq: Once | INTRAVENOUS | Status: AC
  Administered 2020-09-30: 10 mg via INTRAVENOUS
  Filled 2020-09-30: qty 10

## 2020-09-30 MED ORDER — FAMOTIDINE 20 MG IN NS 100 ML IVPB
20.0000 mg | Freq: Once | INTRAVENOUS | Status: AC
Start: 2020-09-30 — End: 2020-09-30
  Administered 2020-09-30: 20 mg via INTRAVENOUS

## 2020-09-30 MED ORDER — HEPARIN SOD (PORK) LOCK FLUSH 100 UNIT/ML IV SOLN
500.0000 [IU] | Freq: Once | INTRAVENOUS | Status: AC | PRN
  Administered 2020-09-30: 500 [IU]
  Filled 2020-09-30: qty 5

## 2020-09-30 MED ORDER — TRASTUZUMAB-ANNS CHEMO 150 MG IV SOLR
300.0000 mg | Freq: Once | INTRAVENOUS | Status: AC
  Administered 2020-09-30: 300 mg via INTRAVENOUS
  Filled 2020-09-30: qty 14.29

## 2020-09-30 MED ORDER — CYANOCOBALAMIN 1000 MCG/ML IJ SOLN
1000.0000 ug | Freq: Once | INTRAMUSCULAR | Status: AC
Start: 2020-09-30 — End: 2020-09-30
  Administered 2020-09-30: 1000 ug via INTRAMUSCULAR

## 2020-09-30 MED ORDER — FAMOTIDINE 20 MG IN NS 100 ML IVPB
INTRAVENOUS | Status: AC
  Filled 2020-09-30: qty 100

## 2020-09-30 MED ORDER — SODIUM CHLORIDE 0.9 % IV SOLN
Freq: Once | INTRAVENOUS | Status: AC
Start: 2020-09-30 — End: 2020-09-30
  Filled 2020-09-30: qty 250

## 2020-09-30 MED ORDER — ACETAMINOPHEN 325 MG PO TABS
650.0000 mg | ORAL_TABLET | Freq: Once | ORAL | Status: AC
  Administered 2020-09-30: 650 mg via ORAL

## 2020-09-30 MED ORDER — SODIUM CHLORIDE 0.9% FLUSH
10.0000 mL | Freq: Once | INTRAVENOUS | Status: AC
  Administered 2020-09-30: 10 mL
  Filled 2020-09-30: qty 10

## 2020-09-30 MED ORDER — ACETAMINOPHEN 325 MG PO TABS
ORAL_TABLET | ORAL | Status: AC
  Filled 2020-09-30: qty 2

## 2020-09-30 MED ORDER — SODIUM CHLORIDE 0.9 % IV SOLN
50.0000 mg/m2 | Freq: Once | INTRAVENOUS | Status: AC
  Administered 2020-09-30: 138 mg via INTRAVENOUS
  Filled 2020-09-30: qty 23

## 2020-09-30 MED ORDER — SODIUM CHLORIDE 0.9% FLUSH
10.0000 mL | INTRAVENOUS | Status: DC | PRN
  Administered 2020-09-30: 10 mL
  Filled 2020-09-30: qty 10

## 2020-09-30 MED ORDER — CYANOCOBALAMIN 1000 MCG/ML IJ SOLN
INTRAMUSCULAR | Status: AC
  Filled 2020-09-30: qty 1

## 2020-09-30 MED ORDER — DIPHENHYDRAMINE HCL 50 MG/ML IJ SOLN
25.0000 mg | Freq: Once | INTRAMUSCULAR | Status: AC
  Administered 2020-09-30: 25 mg via INTRAVENOUS

## 2020-09-30 MED ORDER — DIPHENHYDRAMINE HCL 50 MG/ML IJ SOLN
INTRAMUSCULAR | Status: AC
  Filled 2020-09-30: qty 1

## 2020-09-30 NOTE — Patient Instructions (Signed)
[facility] ONCOLOGY  Discharge Instructions: Thank you for choosing [facility] to provide your oncology and hematology care.   If you have a lab appointment with the [facility], please go directly to the [facility] and check in at the registration area.   Wear comfortable clothing and clothing appropriate for easy access to any Portacath or PICC line.   We strive to give you quality time with your provider. You may need to reschedule your appointment if you arrive late (15 or more minutes).  Arriving late affects you and other patients whose appointments are after yours.  Also, if you miss three or more appointments without notifying the office, you may be dismissed from the clinic at the provider's discretion.      For prescription refill requests, have your pharmacy contact our office and allow 72 hours for refills to be completed.    Today you received the following chemotherapy and/or immunotherapy agents Trastuzumab-anns and Paclitaxel      To help prevent nausea and vomiting after your treatment, we encourage you to take your nausea medication as directed.  BELOW ARE SYMPTOMS THAT SHOULD BE REPORTED IMMEDIATELY: *FEVER GREATER THAN 100.4 F (38 C) OR HIGHER *CHILLS OR SWEATING *NAUSEA AND VOMITING THAT IS NOT CONTROLLED WITH YOUR NAUSEA MEDICATION *UNUSUAL SHORTNESS OF BREATH *UNUSUAL BRUISING OR BLEEDING *URINARY PROBLEMS (pain or burning when urinating, or frequent urination) *BOWEL PROBLEMS (unusual diarrhea, constipation, pain near the anus) TENDERNESS IN MOUTH AND THROAT WITH OR WITHOUT PRESENCE OF ULCERS (sore throat, sores in mouth, or a toothache) UNUSUAL RASH, SWELLING OR PAIN  UNUSUAL VAGINAL DISCHARGE OR ITCHING   Items with * indicate a potential emergency and should be followed up as soon as possible or go to the Emergency Department if any problems should occur.  Please show the CHEMOTHERAPY ALERT CARD or IMMUNOTHERAPY ALERT  CARD at check-in to the Emergency Department and triage nurse.  Should you have questions after your visit or need to cancel or reschedule your appointment, please contact [facility]  Dept: [phone]  and follow the prompts.  Office hours are 8:00 a.m. to 4:30 p.m. Monday - Friday. Please note that voicemails left after 4:00 p.m. may not be returned until the following business day.  We are closed weekends and major holidays. You have access to a nurse at all times for urgent questions. Please call the main number to the clinic Dept: [phone] and follow the prompts.   For any non-urgent questions, you may also contact your provider using MyChart. We now offer e-Visits for anyone 54 and older to request care online for non-urgent symptoms. For details visit [email or web address].   Also download the MyChart app! Go to the app store, search "MyChart", open the app, select [facility], and log in with your MyChart username and password.  Due to Covid, a mask is required upon entering the hospital/clinic. If you do not have a mask, one will be given to you upon arrival. For doctor visits, patients may have 1 support person aged 69 or older with them. For treatment visits, patients cannot have anyone with them due to current Covid guidelines and our immunocompromised population.

## 2020-10-03 ENCOUNTER — Ambulatory Visit (INDEPENDENT_AMBULATORY_CARE_PROVIDER_SITE_OTHER)
Admission: RE | Admit: 2020-10-03 | Discharge: 2020-10-03 | Disposition: A | Discharge status: Home / Self Care | Payer: Self-pay | Source: Ambulatory Visit | Attending: Cardiovascular Disease | Admitting: Cardiovascular Disease

## 2020-10-03 ENCOUNTER — Ambulatory Visit (HOSPITAL_BASED_OUTPATIENT_CLINIC_OR_DEPARTMENT_OTHER): Payer: 59 | Attending: Cardiovascular Disease

## 2020-10-03 ENCOUNTER — Other Ambulatory Visit: Payer: Self-pay

## 2020-10-03 DIAGNOSIS — I10 Essential (primary) hypertension: Secondary | ICD-10-CM

## 2020-10-03 DIAGNOSIS — G4733 Obstructive sleep apnea (adult) (pediatric): Secondary | ICD-10-CM

## 2020-10-03 DIAGNOSIS — E7800 Pure hypercholesterolemia, unspecified: Secondary | ICD-10-CM

## 2020-10-07 ENCOUNTER — Inpatient Hospital Stay: Payer: 59 | Attending: Hematology and Oncology

## 2020-10-07 ENCOUNTER — Inpatient Hospital Stay: Payer: 59

## 2020-10-07 ENCOUNTER — Other Ambulatory Visit: Payer: Self-pay

## 2020-10-07 ENCOUNTER — Other Ambulatory Visit: Payer: 59

## 2020-10-07 ENCOUNTER — Ambulatory Visit: Payer: 59

## 2020-10-07 VITALS — BP 143/68 | HR 79 | Temp 98.4°F | Resp 18 | Wt 330.8 lb

## 2020-10-07 DIAGNOSIS — C50511 Malignant neoplasm of lower-outer quadrant of right female breast: Secondary | ICD-10-CM | POA: Insufficient documentation

## 2020-10-07 DIAGNOSIS — G620 Drug-induced polyneuropathy: Secondary | ICD-10-CM | POA: Diagnosis not present

## 2020-10-07 DIAGNOSIS — R5383 Other fatigue: Secondary | ICD-10-CM | POA: Diagnosis not present

## 2020-10-07 DIAGNOSIS — Z170 Estrogen receptor positive status [ER+]: Secondary | ICD-10-CM

## 2020-10-07 DIAGNOSIS — D6481 Anemia due to antineoplastic chemotherapy: Secondary | ICD-10-CM | POA: Insufficient documentation

## 2020-10-07 DIAGNOSIS — T451X5A Adverse effect of antineoplastic and immunosuppressive drugs, initial encounter: Secondary | ICD-10-CM | POA: Diagnosis not present

## 2020-10-07 DIAGNOSIS — Z79899 Other long term (current) drug therapy: Secondary | ICD-10-CM | POA: Insufficient documentation

## 2020-10-07 DIAGNOSIS — Z95828 Presence of other vascular implants and grafts: Secondary | ICD-10-CM

## 2020-10-07 DIAGNOSIS — Z5112 Encounter for antineoplastic immunotherapy: Secondary | ICD-10-CM | POA: Diagnosis present

## 2020-10-07 DIAGNOSIS — Z5111 Encounter for antineoplastic chemotherapy: Secondary | ICD-10-CM | POA: Insufficient documentation

## 2020-10-07 LAB — CMP (CANCER CENTER ONLY)
ALT: 20 U/L (ref 0–44)
AST: 17 U/L (ref 15–41)
Albumin: 3.7 g/dL (ref 3.5–5.0)
Alkaline Phosphatase: 58 U/L (ref 38–126)
Anion gap: 11 (ref 5–15)
BUN: 11 mg/dL (ref 6–20)
CO2: 28 mmol/L (ref 22–32)
Calcium: 9.9 mg/dL (ref 8.9–10.3)
Chloride: 103 mmol/L (ref 98–111)
Creatinine: 0.85 mg/dL (ref 0.44–1.00)
GFR, Estimated: >60 mL/min (ref >60)
Glucose, Bld: 133 mg/dL — ABNORMAL HIGH (ref 70–99)
Potassium: 3.6 mmol/L (ref 3.5–5.1)
Sodium: 142 mmol/L (ref 135–145)
Total Bilirubin: 0.9 mg/dL (ref 0.3–1.2)
Total Protein: 7 g/dL (ref 6.5–8.1)

## 2020-10-07 LAB — CBC WITH DIFFERENTIAL (CANCER CENTER ONLY)
Abs Immature Granulocytes: 0.24 10*3/uL — ABNORMAL HIGH (ref 0.00–0.07)
Basophils Absolute: 0.1 10*3/uL (ref 0.0–0.1)
Basophils Relative: 1 %
Eosinophils Absolute: 0.1 10*3/uL (ref 0.0–0.5)
Eosinophils Relative: 1 %
HCT: 30.7 % — ABNORMAL LOW (ref 36.0–46.0)
Hemoglobin: 10 g/dL — ABNORMAL LOW (ref 12.0–15.0)
Immature Granulocytes: 4 %
Lymphocytes Relative: 23 %
Lymphs Abs: 1.5 10*3/uL (ref 0.7–4.0)
MCH: 27.7 pg (ref 26.0–34.0)
MCHC: 32.6 g/dL (ref 30.0–36.0)
MCV: 85 fL (ref 80.0–100.0)
Monocytes Absolute: 0.4 10*3/uL (ref 0.1–1.0)
Monocytes Relative: 7 %
Neutro Abs: 4.2 10*3/uL (ref 1.7–7.7)
Neutrophils Relative %: 64 %
Platelet Count: 296 10*3/uL (ref 150–400)
RBC: 3.61 MIL/uL — ABNORMAL LOW (ref 3.87–5.11)
RDW: 18.2 % — ABNORMAL HIGH (ref 11.5–15.5)
WBC Count: 6.6 10*3/uL (ref 4.0–10.5)
nRBC: 0.3 % — ABNORMAL HIGH (ref 0.0–0.2)

## 2020-10-07 MED ORDER — TRASTUZUMAB-ANNS CHEMO 150 MG IV SOLR
300.0000 mg | Freq: Once | INTRAVENOUS | Status: AC
  Administered 2020-10-07: 300 mg via INTRAVENOUS
  Filled 2020-10-07: qty 14.29

## 2020-10-07 MED ORDER — FAMOTIDINE 20 MG IN NS 100 ML IVPB
20.0000 mg | Freq: Once | INTRAVENOUS | Status: AC
Start: 2020-10-07 — End: 2020-10-07
  Administered 2020-10-07: 20 mg via INTRAVENOUS

## 2020-10-07 MED ORDER — SODIUM CHLORIDE 0.9% FLUSH
10.0000 mL | INTRAVENOUS | Status: DC | PRN
  Administered 2020-10-07: 10 mL
  Filled 2020-10-07: qty 10

## 2020-10-07 MED ORDER — SODIUM CHLORIDE 0.9 % IV SOLN
10.0000 mg | Freq: Once | INTRAVENOUS | Status: AC
  Administered 2020-10-07: 10 mg via INTRAVENOUS
  Filled 2020-10-07: qty 10

## 2020-10-07 MED ORDER — CYANOCOBALAMIN 1000 MCG/ML IJ SOLN
1000.0000 ug | Freq: Once | INTRAMUSCULAR | Status: AC
  Administered 2020-10-07: 1000 ug via INTRAMUSCULAR

## 2020-10-07 MED ORDER — ACETAMINOPHEN 325 MG PO TABS
650.0000 mg | ORAL_TABLET | Freq: Once | ORAL | Status: AC
  Administered 2020-10-07: 650 mg via ORAL

## 2020-10-07 MED ORDER — SODIUM CHLORIDE 0.9 % IV SOLN
50.0000 mg/m2 | Freq: Once | INTRAVENOUS | Status: AC
  Administered 2020-10-07: 138 mg via INTRAVENOUS
  Filled 2020-10-07: qty 23

## 2020-10-07 MED ORDER — DIPHENHYDRAMINE HCL 50 MG/ML IJ SOLN
INTRAMUSCULAR | Status: AC
  Filled 2020-10-07: qty 1

## 2020-10-07 MED ORDER — FAMOTIDINE 20 MG IN NS 100 ML IVPB
INTRAVENOUS | Status: AC
  Filled 2020-10-07: qty 100

## 2020-10-07 MED ORDER — HEPARIN SOD (PORK) LOCK FLUSH 100 UNIT/ML IV SOLN
500.0000 [IU] | Freq: Once | INTRAVENOUS | Status: AC | PRN
  Administered 2020-10-07: 500 [IU]
  Filled 2020-10-07: qty 5

## 2020-10-07 MED ORDER — DIPHENHYDRAMINE HCL 50 MG/ML IJ SOLN
25.0000 mg | Freq: Once | INTRAMUSCULAR | Status: AC
  Administered 2020-10-07: 25 mg via INTRAVENOUS

## 2020-10-07 MED ORDER — CYANOCOBALAMIN 1000 MCG/ML IJ SOLN
INTRAMUSCULAR | Status: AC
  Filled 2020-10-07: qty 1

## 2020-10-07 MED ORDER — SODIUM CHLORIDE 0.9% FLUSH
10.0000 mL | Freq: Once | INTRAVENOUS | Status: AC
  Administered 2020-10-07: 10 mL
  Filled 2020-10-07: qty 10

## 2020-10-07 MED ORDER — SODIUM CHLORIDE 0.9 % IV SOLN
Freq: Once | INTRAVENOUS | Status: AC
  Filled 2020-10-07: qty 250

## 2020-10-07 MED ORDER — ACETAMINOPHEN 325 MG PO TABS
ORAL_TABLET | ORAL | Status: AC
  Filled 2020-10-07: qty 2

## 2020-10-07 NOTE — Patient Instructions (Signed)
[facility] ONCOLOG[facility]  Discharge Instructions: Thank you for choosing [facility] to provide your oncology and hematology care.   If you have a lab appointment with the [facility], please go directly to the [facility] and check in at the registration area.   Wear comfortable clothing and clothing appropriate for easy access to any Portacath or PICC line.   We strive to give you quality time with your provider. You may need to reschedule your appointment if you arrive late (15 or more minutes).  Arriving late affects you and other patients whose appointments are after yours.  Also, if you miss three or more appointments without notifying the office, you may be dismissed from the clinic at the provider's discretion.      For prescription refill requests, have your pharmacy contact our office and allow 72 hours for refills to be completed.    Today you received the following chemotherapy and/or immunotherapy agents Trastuzumab-anns and Paclitaxel      To help prevent nausea and vomiting after your treatment, we encourage you to take your nausea medication as directed.  BELOW ARE SYMPTOMS THAT SHOULD BE REPORTED IMMEDIATELY: *FEVER GREATER THAN 100.4 F (38 C) OR HIGHER *CHILLS OR SWEATING *NAUSEA AND VOMITING THAT IS NOT CONTROLLED WITH YOUR NAUSEA MEDICATION *UNUSUAL SHORTNESS OF BREATH *UNUSUAL BRUISING OR BLEEDING *URINARY PROBLEMS (pain or burning when urinating, or frequent urination) *BOWEL PROBLEMS (unusual diarrhea, constipation, pain near the anus) TENDERNESS IN MOUTH AND THROAT WITH OR WITHOUT PRESENCE OF ULCERS (sore throat, sores in mouth, or a toothache) UNUSUAL RASH, SWELLING OR PAIN  UNUSUAL VAGINAL DISCHARGE OR ITCHING   Items with * indicate a potential emergency and should be followed up as soon as possible or go to the Emergency Department if any problems should occur.  Please show the CHEMOTHERAPY ALERT CARD or IMMUNOTHERAPY ALERT  CARD at check-in to the Emergency Department and triage nurse.  Should you have questions after your visit or need to cancel or reschedule your appointment, please contact [facility]  Dept: [phone]  and follow the prompts.  Office hours are 8:00 a.m. to 4:30 p.m. Monday - Friday. Please note that voicemails left after 4:00 p.m. may not be returned until the following business day.  We are closed weekends and major holidays. You have access to a nurse at all times for urgent questions. Please call the main number to the clinic Dept: [phone] and follow the prompts.   For any non-urgent questions, you may also contact your provider using MyChart. We now offer e-Visits for anyone 34 and older to request care online for non-urgent symptoms. For details visit [email or web address].   Also download the MyChart app! Go to the app store, search "MyChart", open the app, select [facility], and log in with your MyChart username and password.  Due to Covid, a mask is required upon entering the hospital/clinic. If you do not have a mask, one will be given to you upon arrival. For doctor visits, patients may have 1 support person aged 57 or older with them. For treatment visits, patients cannot have anyone with them due to current Covid guidelines and our immunocompromised population.

## 2020-10-11 ENCOUNTER — Encounter: Payer: Self-pay | Admitting: *Deleted

## 2020-10-13 ENCOUNTER — Encounter: Payer: Self-pay | Admitting: *Deleted

## 2020-10-13 NOTE — Progress Notes (Signed)
Patient Care Team: [name], MD as PCP - General (Internal Medicine) Mcarthur Rossetti, MD as Consulting Physician (Orthopedic Surgery) Mauro Kaufmann, RN as Oncology Nurse Navigator Rockwell Germany, RN as Oncology Nurse Navigator [name], MD as Consulting Physician (General Surgery) Nicholas Lose, MD as Consulting Physician (Hematology and Oncology) Eppie Gibson, MD as Attending Physician (Radiation Oncology)  DIAGNOSIS:    ICD-10-CM   1. Malignant neoplasm of lower-outer quadrant of right breast of female, estrogen receptor positive ([facility])  C50.511    Z17.0       SUMMARY OF ONCOLOGIC HISTORY: Oncology History  Malignant neoplasm of lower-outer quadrant of right breast of female, estrogen receptor positive ([facility])  05/03/2020 Initial Diagnosis   Screening mammogram showed a 1.0cm mass at the 6 o'clock position in the right breast. Diagnostic mammogram and US showed the 1.2cm mass at the 6 o'clock position in the right breast. Biopsy showed IDC, grade 3, HER-2 positive (3+), ER 15% weak, PR-, Ki67 80%.   05/11/2020 Cancer Staging   Staging form: Breast, AJCC 8th Edition - Clinical stage from 05/11/2020: Stage IA (cT1b, cN0, cM0, G3, ER+, PR-, HER2+) - Signed by Nicholas Lose, MD on 05/11/2020 Stage prefix: Initial diagnosis   05/19/2020 Genetic Testing   Negative hereditary cancer genetic testing: no pathogenic variants detected in [facility] Breast STAT Panel and Common Hereditary Cancers Panel.  The report dates are May 19, 2020 (STAT) and May 23, 2020 (Common Hereditary).   The Common Hereditary Cancers Panel offered by [facility] includes sequencing and/or deletion duplication testing of the following 47 genes: APC, ATM, AXIN2, BARD1, BMPR1A, BRCA1, BRCA2, BRIP1, CDH1, CDK4, CDKN2A (p14ARF), CDKN2A (p16INK4a), CHEK2, CTNNA1, DICER1, EPCAM (Deletion/duplication testing only), GREM1 (promoter region deletion/duplication testing only), GREM1, HOXB13, KIT, MEN1, MLH1, MSH2,  MSH3, MSH6, MUTYH, NBN, NF1, NHTL1, PALB2, PDGFRA, PMS2, POLD1, POLE, PTEN, RAD50, RAD51C, RAD51D, SDHA, SDHB, SDHC, SDHD, SMAD4, SMARCA4. STK11, TP53, TSC1, TSC2, and VHL.  The following genes were evaluated for sequence changes only: SDHA and HOXB13 c.251G>A variant only.es only: SDHA and HOXB13 c.251G>A variant only.   06/02/2020 Surgery   Right lumpectomy ([facility]): invasive and in situ ductal carcinoma, 1.3cm, clear margins, 1 right axillary lymph node negative for carcinoma.   06/02/2020 Cancer Staging   Staging form: Breast, AJCC 8th Edition - Pathologic stage from 06/02/2020: Stage IA (pT1c, pN0, cM0, G3, ER+, PR-, HER2+) - Signed by Gardenia Phlegm, NP on 06/15/2020 Stage prefix: Initial diagnosis Histologic grading system: 3 grade system   07/08/2020 - 08/05/2020 Chemotherapy          08/12/2020 -  Chemotherapy    Patient is on Treatment Plan: BREAST PACLITAXEL + TRASTUZUMAB Q7D / TRASTUZUMAB Q21D         CHIEF COMPLIANT:Cycle 11 Taxol Herceptin  INTERVAL HISTORY: Pamela [name] is a 55 y.o. with above-mentioned history of right breast cancer treated with lumpectomy, and is currently on Taxol and Herceptin maintenance. She reports to the clinic today for treatment.  She is tolerating chemotherapy extremely well without any major problems.  Muscle aches and pains are well managed.  Denies any nausea or vomiting.  Denies any significant peripheral neuropathy.  She had mild numbness which has improved.  ALLERGIES:  is allergic to pollen extract, latex, codeine, shellfish allergy, and betadine [povidone iodine].  MEDICATIONS:  Current Outpatient Medications  Medication Sig Dispense Refill   amLODipine (NORVASC) 10 MG tablet TAKE 1 TABLET BY MOUTH DAILY. 90 tablet 2   aspirin EC 81 MG  tablet Take 1 tablet (81 mg total) by mouth daily. Swallow whole. 30 tablet 11   Azilsartan-Chlorthalidone (EDARBYCLOR) 40-25 MG TABS Take 1 tablet by mouth daily. 90 tablet 2   hydrOXYzine  (ATARAX/VISTARIL) 25 MG tablet Take 1 tablet (25 mg total) by mouth every 6 (six) hours as needed for anxiety. 36 tablet 0   levonorgestrel (MIRENA, 52 MG,) 20 MCG/DAY IUD 1 each by Intrauterine route once.     lidocaine-prilocaine (EMLA) cream Apply 1 application topically once.     LORazepam (ATIVAN) 0.5 MG tablet Take 1 tablet 20 minutes before radiation therapy, PRN anxiety.  May take once daily at home the week before treatment to get used to effects. 30 tablet 0   rosuvastatin (CRESTOR) 40 MG tablet Take 1 tablet (40 mg total) by mouth daily. 90 tablet 3   Vitamin D, Ergocalciferol, (DRISDOL) 1.25 MG (50000 UNIT) CAPS capsule Take 1 capsule (50,000 Units total) by mouth every 7 (seven) days. 24 capsule 1   No current facility-administered medications for this visit.    PHYSICAL EXAMINATION: ECOG PERFORMANCE STATUS: 1 - Symptomatic but completely ambulatory  Vitals:   10/14/20 0952  BP: (!) 150/81  Pulse: 76  Resp: 19  Temp: (!) 97.2 F (36.2 C)  SpO2: 100%   Filed Weights   10/14/20 0952  Weight: (!) 332 lb 11.2 oz (150.9 kg)    LABORATORY DATA:  I have reviewed the data as listed CMP Latest Ref Rng & Units 10/07/2020 09/30/2020 09/23/2020  Glucose 70 - 99 mg/dL 133(H) 113(H) 113(H)  BUN 6 - 20 mg/dL 11 9 11   Creatinine 0.44 - 1.00 mg/dL 0.85 0.84 0.73  Sodium 135 - 145 mmol/L 142 142 143  Potassium 3.5 - 5.1 mmol/L 3.6 3.6 3.6  Chloride 98 - 111 mmol/L 103 103 100  CO2 22 - 32 mmol/L 28 30 32  Calcium 8.9 - 10.3 mg/dL 9.9 9.5 9.2  Total Protein 6.5 - 8.1 g/dL 7.0 6.8 6.9  Total Bilirubin 0.3 - 1.2 mg/dL 0.9 0.8 0.7  Alkaline Phos 38 - 126 U/L 58 58 52  AST 15 - 41 U/L 17 18 20   ALT 0 - 44 U/L 20 24 24     Lab Results  Component Value Date   WBC 6.6 10/07/2020   HGB 10.0 (L) 10/07/2020   HCT 30.7 (L) 10/07/2020   MCV 85.0 10/07/2020   PLT 296 10/07/2020   NEUTROABS 4.2 10/07/2020    ASSESSMENT & PLAN:  Malignant neoplasm of lower-outer quadrant of right breast  of female, estrogen receptor positive ([facility]) 05/03/2020:Screening mammogram showed a 1.0cm mass at the 6 o'clock position in the right breast. Diagnostic mammogram and US showed the 1.2cm mass at the 6 o'clock position in the right breast. Biopsy showed IDC, grade 3, HER-2 positive (3+), ER 15% weak, PR-, Ki67 80%.   06/02/2020:Right lumpectomy ([facility]): invasive and in situ ductal carcinoma, 1.3cm, clear margins, 1 right axillary lymph node negative for carcinoma. ER 15% weak, PR-,HER-2 positive (3+) Ki67 80%.   Treatment plan: 1.  Adjuvant chemotherapy with Taxol Herceptin followed by Herceptin maintenance. Initially had significant pain with 80 mg/m2, held x 3 weeks then dose reduced to 50 mg/m2 2.  Adjuvant radiation therapy 3.  Adjuvant antiestrogen therapy ----------------------------------------------------------------------------------------------------- Current Treatment:Cycle 11 Taxol-Herceptin ECHO 08/30/20: EF 60-65%   Chemo Toxicities:  Fatigue: stable, managing with energy conservation.  Anemia: stable Mild peripheral neuropathy: It has not progressed.   She has 1 more dose of Taxol Herceptin after and  after that she could start radiation. We will send a referral for radiation.  She will then be set up for every 3-week Herceptin's.    No orders of the defined types were placed in this encounter.  The patient has a good understanding of the overall plan. she agrees with it. she will call with any problems that may develop before the next visit here.  Total time spent: 30 mins including face to face time and time spent for planning, charting and coordination of care  Rulon Eisenmenger, MD, MPH 10/14/2020  I, Thana Ates, am acting as scribe for Dr. Nicholas Lose.  I have reviewed the above documentation for accuracy and completeness, and I agree with the above.

## 2020-10-14 ENCOUNTER — Inpatient Hospital Stay: Payer: 59

## 2020-10-14 ENCOUNTER — Other Ambulatory Visit: Payer: Self-pay

## 2020-10-14 ENCOUNTER — Other Ambulatory Visit: Payer: Self-pay | Admitting: Hematology and Oncology

## 2020-10-14 ENCOUNTER — Inpatient Hospital Stay (HOSPITAL_BASED_OUTPATIENT_CLINIC_OR_DEPARTMENT_OTHER): Payer: 59 | Admitting: Hematology and Oncology

## 2020-10-14 ENCOUNTER — Other Ambulatory Visit: Payer: Self-pay | Admitting: *Deleted

## 2020-10-14 DIAGNOSIS — C50511 Malignant neoplasm of lower-outer quadrant of right female breast: Secondary | ICD-10-CM

## 2020-10-14 DIAGNOSIS — Z5112 Encounter for antineoplastic immunotherapy: Secondary | ICD-10-CM | POA: Diagnosis not present

## 2020-10-14 DIAGNOSIS — Z95828 Presence of other vascular implants and grafts: Secondary | ICD-10-CM

## 2020-10-14 DIAGNOSIS — Z170 Estrogen receptor positive status [ER+]: Secondary | ICD-10-CM

## 2020-10-14 LAB — CMP (CANCER CENTER ONLY)
ALT: 24 U/L (ref 0–44)
AST: 18 U/L (ref 15–41)
Albumin: 3.6 g/dL (ref 3.5–5.0)
Alkaline Phosphatase: 59 U/L (ref 38–126)
Anion gap: 9 (ref 5–15)
BUN: 8 mg/dL (ref 6–20)
CO2: 28 mmol/L (ref 22–32)
Calcium: 9.4 mg/dL (ref 8.9–10.3)
Chloride: 105 mmol/L (ref 98–111)
Creatinine: 0.84 mg/dL (ref 0.44–1.00)
GFR, Estimated: >60 mL/min (ref >60)
Glucose, Bld: 107 mg/dL — ABNORMAL HIGH (ref 70–99)
Potassium: 3.8 mmol/L (ref 3.5–5.1)
Sodium: 142 mmol/L (ref 135–145)
Total Bilirubin: 0.8 mg/dL (ref 0.3–1.2)
Total Protein: 6.8 g/dL (ref 6.5–8.1)

## 2020-10-14 LAB — CBC WITH DIFFERENTIAL (CANCER CENTER ONLY)
Abs Immature Granulocytes: 0.34 10*3/uL — ABNORMAL HIGH (ref 0.00–0.07)
Basophils Absolute: 0.1 10*3/uL (ref 0.0–0.1)
Basophils Relative: 1 %
Eosinophils Absolute: 0.1 10*3/uL (ref 0.0–0.5)
Eosinophils Relative: 2 %
HCT: 30.7 % — ABNORMAL LOW (ref 36.0–46.0)
Hemoglobin: 10 g/dL — ABNORMAL LOW (ref 12.0–15.0)
Immature Granulocytes: 5 %
Lymphocytes Relative: 21 %
Lymphs Abs: 1.3 10*3/uL (ref 0.7–4.0)
MCH: 27.9 pg (ref 26.0–34.0)
MCHC: 32.6 g/dL (ref 30.0–36.0)
MCV: 85.5 fL (ref 80.0–100.0)
Monocytes Absolute: 0.5 10*3/uL (ref 0.1–1.0)
Monocytes Relative: 8 %
Neutro Abs: 4 10*3/uL (ref 1.7–7.7)
Neutrophils Relative %: 63 %
Platelet Count: 263 10*3/uL (ref 150–400)
RBC: 3.59 MIL/uL — ABNORMAL LOW (ref 3.87–5.11)
RDW: 18.7 % — ABNORMAL HIGH (ref 11.5–15.5)
WBC Count: 6.3 10*3/uL (ref 4.0–10.5)
nRBC: 0.3 % — ABNORMAL HIGH (ref 0.0–0.2)

## 2020-10-14 MED ORDER — DIPHENHYDRAMINE HCL 50 MG/ML IJ SOLN
INTRAMUSCULAR | Status: AC
  Filled 2020-10-14: qty 1

## 2020-10-14 MED ORDER — HEPARIN SOD (PORK) LOCK FLUSH 100 UNIT/ML IV SOLN
500.0000 [IU] | Freq: Once | INTRAVENOUS | Status: AC | PRN
  Administered 2020-10-14: 500 [IU]
  Filled 2020-10-14: qty 5

## 2020-10-14 MED ORDER — SODIUM CHLORIDE 0.9 % IV SOLN
10.0000 mg | Freq: Once | INTRAVENOUS | Status: AC
  Administered 2020-10-14: 10 mg via INTRAVENOUS
  Filled 2020-10-14: qty 10

## 2020-10-14 MED ORDER — SODIUM CHLORIDE 0.9% FLUSH
10.0000 mL | Freq: Once | INTRAVENOUS | Status: AC
  Administered 2020-10-14: 10 mL
  Filled 2020-10-14: qty 10

## 2020-10-14 MED ORDER — TRASTUZUMAB-ANNS CHEMO 150 MG IV SOLR
300.0000 mg | Freq: Once | INTRAVENOUS | Status: AC
  Administered 2020-10-14: 300 mg via INTRAVENOUS
  Filled 2020-10-14: qty 14.29

## 2020-10-14 MED ORDER — DIPHENHYDRAMINE HCL 50 MG/ML IJ SOLN
25.0000 mg | Freq: Once | INTRAMUSCULAR | Status: AC
  Administered 2020-10-14: 25 mg via INTRAVENOUS

## 2020-10-14 MED ORDER — SODIUM CHLORIDE 0.9 % IV SOLN
Freq: Once | INTRAVENOUS | Status: AC
  Filled 2020-10-14: qty 250

## 2020-10-14 MED ORDER — SODIUM CHLORIDE 0.9% FLUSH
10.0000 mL | INTRAVENOUS | Status: DC | PRN
  Administered 2020-10-14: 10 mL
  Filled 2020-10-14: qty 10

## 2020-10-14 MED ORDER — FAMOTIDINE 20 MG IN NS 100 ML IVPB
20.0000 mg | Freq: Once | INTRAVENOUS | Status: AC
  Administered 2020-10-14: 20 mg via INTRAVENOUS
  Filled 2020-10-14: qty 100

## 2020-10-14 MED ORDER — SODIUM CHLORIDE 0.9 % IV SOLN
50.0000 mg/m2 | Freq: Once | INTRAVENOUS | Status: AC
  Administered 2020-10-14: 138 mg via INTRAVENOUS
  Filled 2020-10-14: qty 23

## 2020-10-14 MED ORDER — ACETAMINOPHEN 325 MG PO TABS
650.0000 mg | ORAL_TABLET | Freq: Once | ORAL | Status: AC
  Administered 2020-10-14: 650 mg via ORAL
  Filled 2020-10-14: qty 2

## 2020-10-14 NOTE — Assessment & Plan Note (Signed)
05/03/2020:Screening mammogram showed a 1.0cm mass at the 6 o'clock position in the right breast. Diagnostic mammogram and US showed the 1.2cm mass at the 6 o'clock position in the right breast. Biopsy showed IDC, grade 3, HER-2 positive (3+), ER 15% weak, PR-, Ki67 80%.  06/02/2020:Right lumpectomy ([facility]): invasive and in situ ductal carcinoma, 1.3cm, clear margins, 1 right axillary lymph node negative for carcinoma. ER 15% weak, PR-,HER-2 positive (3+) Ki67 80%.  Treatment plan: 1.Adjuvant chemotherapy with Taxol Herceptinfollowed byHerceptin maintenance. Initially had significant pain with 80 mg/m2, held x 3 weeks then dose reduced to 50 mg/m2 2.Adjuvant radiation therapy 3.Adjuvant antiestrogen therapy ----------------------------------------------------------------------------------------------------- CurrentTreatment:Cycle 11 Taxol-Herceptin ECHO 08/30/20: EF 60-65%  Chemo Toxicities: Fatigue: stable, managing with energy conservation.  Anemia: stable Mild peripheral neuropathy: It has not progressed.  She has 1 more dose of Taxol Herceptin after and after that she could start radiation. We will send a referral for radiation.  She will then be set up for every 3-week Herceptin's.

## 2020-10-14 NOTE — Patient Instructions (Signed)
[facility] ONCOLOGY   Discharge Instructions: Thank you for choosing [facility] to provide your oncology and hematology care.   If you have a lab appointment with the [facility], please go directly to the [facility] and check in at the registration area.   Wear comfortable clothing and clothing appropriate for easy access to any Portacath or PICC line.   We strive to give you quality time with your provider. You may need to reschedule your appointment if you arrive late (15 or more minutes).  Arriving late affects you and other patients whose appointments are after yours.  Also, if you miss three or more appointments without notifying the office, you may be dismissed from the clinic at the provider's discretion.      For prescription refill requests, have your pharmacy contact our office and allow 72 hours for refills to be completed.    Today you received the following chemotherapy and/or immunotherapy agents: trastuzumab and paclitaxel.      To help prevent nausea and vomiting after your treatment, we encourage you to take your nausea medication as directed.  BELOW ARE SYMPTOMS THAT SHOULD BE REPORTED IMMEDIATELY: *FEVER GREATER THAN 100.4 F (38 C) OR HIGHER *CHILLS OR SWEATING *NAUSEA AND VOMITING THAT IS NOT CONTROLLED WITH YOUR NAUSEA MEDICATION *UNUSUAL SHORTNESS OF BREATH *UNUSUAL BRUISING OR BLEEDING *URINARY PROBLEMS (pain or burning when urinating, or frequent urination) *BOWEL PROBLEMS (unusual diarrhea, constipation, pain near the anus) TENDERNESS IN MOUTH AND THROAT WITH OR WITHOUT PRESENCE OF ULCERS (sore throat, sores in mouth, or a toothache) UNUSUAL RASH, SWELLING OR PAIN  UNUSUAL VAGINAL DISCHARGE OR ITCHING   Items with * indicate a potential emergency and should be followed up as soon as possible or go to the Emergency Department if any problems should occur.  Please show the CHEMOTHERAPY ALERT CARD or IMMUNOTHERAPY ALERT  CARD at check-in to the Emergency Department and triage nurse.  Should you have questions after your visit or need to cancel or reschedule your appointment, please contact [name]  Dept: [phone]  and follow the prompts.  Office hours are 8:00 a.m. to 4:30 p.m. Monday - Friday. Please note that voicemails left after 4:00 p.m. may not be returned until the following business day.  We are closed weekends and major holidays. You have access to a nurse at all times for urgent questions. Please call the main number to the clinic Dept: [phone] and follow the prompts.   For any non-urgent questions, you may also contact your provider using MyChart. We now offer e-Visits for anyone 69 and older to request care online for non-urgent symptoms. For details visit [email or web address].   Also download the MyChart app! Go to the app store, search "MyChart", open the app, select [facility], and log in with your MyChart username and password.  Due to Covid, a mask is required upon entering the hospital/clinic. If you do not have a mask, one will be given to you upon arrival. For doctor visits, patients may have 1 support person aged 43 or older with them. For treatment visits, patients cannot have anyone with them due to current Covid guidelines and our immunocompromised population.

## 2020-10-14 NOTE — Progress Notes (Signed)
Kanjinti '6mg'$ /kg to begin on 10/21/20 with the last scheduled dose of Taxol.  Raul Del [place], [place], BCPS, BCOP 10/14/2020 12:57 PM

## 2020-10-20 MED FILL — Dexamethasone Sodium Phosphate Inj 100 MG/10ML: INTRAMUSCULAR | Qty: 1 | Status: AC

## 2020-10-21 ENCOUNTER — Other Ambulatory Visit: Payer: Self-pay

## 2020-10-21 ENCOUNTER — Inpatient Hospital Stay: Payer: 59

## 2020-10-21 ENCOUNTER — Other Ambulatory Visit: Payer: Self-pay | Admitting: Adult Health

## 2020-10-21 VITALS — BP 141/57 | HR 71 | Temp 98.6°F | Resp 18 | Ht 69.0 in | Wt 331.0 lb

## 2020-10-21 DIAGNOSIS — C50511 Malignant neoplasm of lower-outer quadrant of right female breast: Secondary | ICD-10-CM

## 2020-10-21 DIAGNOSIS — Z95828 Presence of other vascular implants and grafts: Secondary | ICD-10-CM

## 2020-10-21 DIAGNOSIS — Z170 Estrogen receptor positive status [ER+]: Secondary | ICD-10-CM

## 2020-10-21 DIAGNOSIS — Z5112 Encounter for antineoplastic immunotherapy: Secondary | ICD-10-CM | POA: Diagnosis not present

## 2020-10-21 LAB — CMP (CANCER CENTER ONLY)
ALT: 20 U/L (ref 0–44)
AST: 15 U/L (ref 15–41)
Albumin: 3.6 g/dL (ref 3.5–5.0)
Alkaline Phosphatase: 63 U/L (ref 38–126)
Anion gap: 10 (ref 5–15)
BUN: 9 mg/dL (ref 6–20)
CO2: 27 mmol/L (ref 22–32)
Calcium: 9.4 mg/dL (ref 8.9–10.3)
Chloride: 104 mmol/L (ref 98–111)
Creatinine: 0.85 mg/dL (ref 0.44–1.00)
GFR, Estimated: >60 mL/min (ref >60)
Glucose, Bld: 114 mg/dL — ABNORMAL HIGH (ref 70–99)
Potassium: 3.6 mmol/L (ref 3.5–5.1)
Sodium: 141 mmol/L (ref 135–145)
Total Bilirubin: 1 mg/dL (ref 0.3–1.2)
Total Protein: 6.7 g/dL (ref 6.5–8.1)

## 2020-10-21 LAB — CBC WITH DIFFERENTIAL (CANCER CENTER ONLY)
Abs Immature Granulocytes: 0.15 10*3/uL — ABNORMAL HIGH (ref 0.00–0.07)
Basophils Absolute: 0 10*3/uL (ref 0.0–0.1)
Basophils Relative: 1 %
Eosinophils Absolute: 0.1 10*3/uL (ref 0.0–0.5)
Eosinophils Relative: 1 %
HCT: 31.3 % — ABNORMAL LOW (ref 36.0–46.0)
Hemoglobin: 10 g/dL — ABNORMAL LOW (ref 12.0–15.0)
Immature Granulocytes: 2 %
Lymphocytes Relative: 25 %
Lymphs Abs: 1.7 10*3/uL (ref 0.7–4.0)
MCH: 27.6 pg (ref 26.0–34.0)
MCHC: 31.9 g/dL (ref 30.0–36.0)
MCV: 86.5 fL (ref 80.0–100.0)
Monocytes Absolute: 0.5 10*3/uL (ref 0.1–1.0)
Monocytes Relative: 8 %
Neutro Abs: 4.4 10*3/uL (ref 1.7–7.7)
Neutrophils Relative %: 63 %
Platelet Count: 276 10*3/uL (ref 150–400)
RBC: 3.62 MIL/uL — ABNORMAL LOW (ref 3.87–5.11)
RDW: 18.5 % — ABNORMAL HIGH (ref 11.5–15.5)
WBC Count: 6.9 10*3/uL (ref 4.0–10.5)
nRBC: 0 % (ref 0.0–0.2)

## 2020-10-21 MED ORDER — SODIUM CHLORIDE 0.9 % IV SOLN
10.0000 mg | Freq: Once | INTRAVENOUS | Status: AC
  Administered 2020-10-21: 10 mg via INTRAVENOUS
  Filled 2020-10-21: qty 10

## 2020-10-21 MED ORDER — SODIUM CHLORIDE 0.9% FLUSH
10.0000 mL | INTRAVENOUS | Status: DC | PRN
  Administered 2020-10-21: 10 mL

## 2020-10-21 MED ORDER — TRASTUZUMAB-ANNS CHEMO 150 MG IV SOLR
900.0000 mg | Freq: Once | INTRAVENOUS | Status: AC
  Administered 2020-10-21: 900 mg via INTRAVENOUS
  Filled 2020-10-21: qty 42.86

## 2020-10-21 MED ORDER — SODIUM CHLORIDE 0.9% FLUSH
10.0000 mL | Freq: Once | INTRAVENOUS | Status: AC
  Administered 2020-10-21: 10 mL

## 2020-10-21 MED ORDER — DIPHENHYDRAMINE HCL 50 MG/ML IJ SOLN
25.0000 mg | Freq: Once | INTRAMUSCULAR | Status: AC
  Administered 2020-10-21: 25 mg via INTRAVENOUS
  Filled 2020-10-21: qty 1

## 2020-10-21 MED ORDER — FAMOTIDINE 20 MG IN NS 100 ML IVPB
20.0000 mg | Freq: Once | INTRAVENOUS | Status: AC
  Administered 2020-10-21: 20 mg via INTRAVENOUS
  Filled 2020-10-21: qty 100

## 2020-10-21 MED ORDER — SODIUM CHLORIDE 0.9 % IV SOLN
50.0000 mg/m2 | Freq: Once | INTRAVENOUS | Status: AC
  Administered 2020-10-21: 138 mg via INTRAVENOUS
  Filled 2020-10-21: qty 23

## 2020-10-21 MED ORDER — HEPARIN SOD (PORK) LOCK FLUSH 100 UNIT/ML IV SOLN
500.0000 [IU] | Freq: Once | INTRAVENOUS | Status: AC | PRN
  Administered 2020-10-21: 500 [IU]

## 2020-10-21 MED ORDER — SODIUM CHLORIDE 0.9 % IV SOLN: Freq: Once | INTRAVENOUS | Status: AC

## 2020-10-21 MED ORDER — ACETAMINOPHEN 325 MG PO TABS
650.0000 mg | ORAL_TABLET | Freq: Once | ORAL | Status: AC
  Administered 2020-10-21: 650 mg via ORAL
  Filled 2020-10-21: qty 2

## 2020-10-21 NOTE — Patient Instructions (Signed)
[facility] ONCOLOGY  Discharge Instructions: Thank you for choosing [facility] to provide your oncology and hematology care.   If you have a lab appointment with the [facility], please go directly to the [facility] and check in at the registration area.   Wear comfortable clothing and clothing appropriate for easy access to any Portacath or PICC line.   We strive to give you quality time with your provider. You may need to reschedule your appointment if you arrive late (15 or more minutes).  Arriving late affects you and other patients whose appointments are after yours.  Also, if you miss three or more appointments without notifying the office, you may be dismissed from the clinic at the provider's discretion.      For prescription refill requests, have your pharmacy contact our office and allow 72 hours for refills to be completed.    Today you received the following chemotherapy and/or immunotherapy agents Herceptin and Taxol      To help prevent nausea and vomiting after your treatment, we encourage you to take your nausea medication as directed.  BELOW ARE SYMPTOMS THAT SHOULD BE REPORTED IMMEDIATELY: *FEVER GREATER THAN 100.4 F (38 C) OR HIGHER *CHILLS OR SWEATING *NAUSEA AND VOMITING THAT IS NOT CONTROLLED WITH YOUR NAUSEA MEDICATION *UNUSUAL SHORTNESS OF BREATH *UNUSUAL BRUISING OR BLEEDING *URINARY PROBLEMS (pain or burning when urinating, or frequent urination) *BOWEL PROBLEMS (unusual diarrhea, constipation, pain near the anus) TENDERNESS IN MOUTH AND THROAT WITH OR WITHOUT PRESENCE OF ULCERS (sore throat, sores in mouth, or a toothache) UNUSUAL RASH, SWELLING OR PAIN  UNUSUAL VAGINAL DISCHARGE OR ITCHING   Items with * indicate a potential emergency and should be followed up as soon as possible or go to the Emergency Department if any problems should occur.  Please show the CHEMOTHERAPY ALERT CARD or IMMUNOTHERAPY ALERT CARD at  check-in to the Emergency Department and triage nurse.  Should you have questions after your visit or need to cancel or reschedule your appointment, please contact [facility]  Dept: [phone]  and follow the prompts.  Office hours are 8:00 a.m. to 4:30 p.m. Monday - Friday. Please note that voicemails left after 4:00 p.m. may not be returned until the following business day.  We are closed weekends and major holidays. You have access to a nurse at all times for urgent questions. Please call the main number to the clinic Dept: [phone] and follow the prompts.   For any non-urgent questions, you may also contact your provider using MyChart. We now offer e-Visits for anyone 31 and older to request care online for non-urgent symptoms. For details visit [email or web address].   Also download the MyChart app! Go to the app store, search "MyChart", open the app, select [facility], and log in with your MyChart username and password.  Due to Covid, a mask is required upon entering the hospital/clinic. If you do not have a mask, one will be given to you upon arrival. For doctor visits, patients may have 1 support person aged 70 or older with them. For treatment visits, patients cannot have anyone with them due to current Covid guidelines and our immunocompromised population.

## 2020-10-26 ENCOUNTER — Telehealth: Payer: Self-pay

## 2020-10-26 NOTE — Telephone Encounter (Signed)
Patient called regarding redness to bilateral hands and feet.   Patient is s/p D15C3 Trastuzumab/Taxol.   Denies any new lotions, or products.  RN educated patient that this can be a chemotherapy side effect.  Denies any fever, or nausea.    RN encouraged patient to utilize a thick cream, such as urea.  Avoid sun, harsh chemicals, or sun exposure to areas. Monitor for changes in skin.   Patient verbalized understanding and will call clinic back in the next few days to update on improvement.

## 2020-10-28 ENCOUNTER — Ambulatory Visit: Payer: 59

## 2020-10-28 ENCOUNTER — Encounter: Payer: Self-pay | Admitting: *Deleted

## 2020-10-28 ENCOUNTER — Other Ambulatory Visit: Payer: 59

## 2020-10-28 ENCOUNTER — Ambulatory Visit: Payer: 59 | Admitting: Hematology and Oncology

## 2020-11-04 ENCOUNTER — Other Ambulatory Visit: Payer: 59

## 2020-11-04 ENCOUNTER — Ambulatory Visit: Payer: 59

## 2020-11-09 ENCOUNTER — Ambulatory Visit: Payer: 59 | Admitting: Radiation Oncology

## 2020-11-09 ENCOUNTER — Encounter (INDEPENDENT_AMBULATORY_CARE_PROVIDER_SITE_OTHER): Payer: Self-pay

## 2020-11-09 ENCOUNTER — Ambulatory Visit
Admission: RE | Admit: 2020-11-09 | Discharge: 2020-11-09 | Disposition: A | Discharge status: Home / Self Care | Payer: 59 | Source: Ambulatory Visit | Attending: Radiation Oncology | Admitting: Radiation Oncology

## 2020-11-09 ENCOUNTER — Ambulatory Visit: Payer: 59

## 2020-11-11 ENCOUNTER — Other Ambulatory Visit: Payer: Self-pay

## 2020-11-11 ENCOUNTER — Inpatient Hospital Stay: Payer: 59 | Attending: Hematology and Oncology

## 2020-11-11 ENCOUNTER — Encounter: Payer: Self-pay | Admitting: *Deleted

## 2020-11-11 VITALS — BP 145/78 | HR 78 | Temp 99.0°F | Resp 18 | Wt 328.0 lb

## 2020-11-11 DIAGNOSIS — T451X5A Adverse effect of antineoplastic and immunosuppressive drugs, initial encounter: Secondary | ICD-10-CM | POA: Diagnosis not present

## 2020-11-11 DIAGNOSIS — Z95828 Presence of other vascular implants and grafts: Secondary | ICD-10-CM

## 2020-11-11 DIAGNOSIS — Z79899 Other long term (current) drug therapy: Secondary | ICD-10-CM | POA: Insufficient documentation

## 2020-11-11 DIAGNOSIS — Z5112 Encounter for antineoplastic immunotherapy: Secondary | ICD-10-CM | POA: Diagnosis present

## 2020-11-11 DIAGNOSIS — D6481 Anemia due to antineoplastic chemotherapy: Secondary | ICD-10-CM | POA: Insufficient documentation

## 2020-11-11 DIAGNOSIS — Z793 Long term (current) use of hormonal contraceptives: Secondary | ICD-10-CM | POA: Diagnosis not present

## 2020-11-11 DIAGNOSIS — C50511 Malignant neoplasm of lower-outer quadrant of right female breast: Secondary | ICD-10-CM | POA: Insufficient documentation

## 2020-11-11 DIAGNOSIS — Z170 Estrogen receptor positive status [ER+]: Secondary | ICD-10-CM | POA: Insufficient documentation

## 2020-11-11 DIAGNOSIS — Z885 Allergy status to narcotic agent status: Secondary | ICD-10-CM | POA: Diagnosis not present

## 2020-11-11 LAB — CBC WITH DIFFERENTIAL (CANCER CENTER ONLY)
Abs Immature Granulocytes: 0.17 10*3/uL — ABNORMAL HIGH (ref 0.00–0.07)
Basophils Absolute: 0.1 10*3/uL (ref 0.0–0.1)
Basophils Relative: 1 %
Eosinophils Absolute: 0.2 10*3/uL (ref 0.0–0.5)
Eosinophils Relative: 2 %
HCT: 33.6 % — ABNORMAL LOW (ref 36.0–46.0)
Hemoglobin: 10.8 g/dL — ABNORMAL LOW (ref 12.0–15.0)
Immature Granulocytes: 2 %
Lymphocytes Relative: 19 %
Lymphs Abs: 1.8 10*3/uL (ref 0.7–4.0)
MCH: 27.5 pg (ref 26.0–34.0)
MCHC: 32.1 g/dL (ref 30.0–36.0)
MCV: 85.5 fL (ref 80.0–100.0)
Monocytes Absolute: 1.2 10*3/uL — ABNORMAL HIGH (ref 0.1–1.0)
Monocytes Relative: 13 %
Neutro Abs: 5.8 10*3/uL (ref 1.7–7.7)
Neutrophils Relative %: 63 %
Platelet Count: 259 10*3/uL (ref 150–400)
RBC: 3.93 MIL/uL (ref 3.87–5.11)
RDW: 17.3 % — ABNORMAL HIGH (ref 11.5–15.5)
WBC Count: 9.2 10*3/uL (ref 4.0–10.5)
nRBC: 0.2 % (ref 0.0–0.2)

## 2020-11-11 LAB — CMP (CANCER CENTER ONLY)
ALT: 25 U/L (ref 0–44)
AST: 24 U/L (ref 15–41)
Albumin: 3.9 g/dL (ref 3.5–5.0)
Alkaline Phosphatase: 63 U/L (ref 38–126)
Anion gap: 13 (ref 5–15)
BUN: 9 mg/dL (ref 6–20)
CO2: 27 mmol/L (ref 22–32)
Calcium: 9.9 mg/dL (ref 8.9–10.3)
Chloride: 102 mmol/L (ref 98–111)
Creatinine: 0.99 mg/dL (ref 0.44–1.00)
GFR, Estimated: >60 mL/min (ref >60)
Glucose, Bld: 112 mg/dL — ABNORMAL HIGH (ref 70–99)
Potassium: 3.1 mmol/L — ABNORMAL LOW (ref 3.5–5.1)
Sodium: 142 mmol/L (ref 135–145)
Total Bilirubin: 1.2 mg/dL (ref 0.3–1.2)
Total Protein: 7.1 g/dL (ref 6.5–8.1)

## 2020-11-11 MED ORDER — HEPARIN SOD (PORK) LOCK FLUSH 100 UNIT/ML IV SOLN
500.0000 [IU] | Freq: Once | INTRAVENOUS | Status: AC | PRN
  Administered 2020-11-11: 500 [IU]

## 2020-11-11 MED ORDER — SODIUM CHLORIDE 0.9 % IV SOLN: Freq: Once | INTRAVENOUS | Status: AC

## 2020-11-11 MED ORDER — ACETAMINOPHEN 325 MG PO TABS
ORAL_TABLET | ORAL | Status: AC
  Filled 2020-11-11: qty 2

## 2020-11-11 MED ORDER — DIPHENHYDRAMINE HCL 50 MG/ML IJ SOLN
25.0000 mg | Freq: Once | INTRAMUSCULAR | Status: AC
  Administered 2020-11-11: 25 mg via INTRAVENOUS
  Filled 2020-11-11: qty 1

## 2020-11-11 MED ORDER — TRASTUZUMAB-ANNS CHEMO 150 MG IV SOLR
900.0000 mg | Freq: Once | INTRAVENOUS | Status: AC
  Administered 2020-11-11: 900 mg via INTRAVENOUS
  Filled 2020-11-11: qty 42.86

## 2020-11-11 MED ORDER — CYANOCOBALAMIN 1000 MCG/ML IJ SOLN
INTRAMUSCULAR | Status: AC
  Filled 2020-11-11: qty 1

## 2020-11-11 MED ORDER — CYANOCOBALAMIN 1000 MCG/ML IJ SOLN
1000.0000 ug | Freq: Once | INTRAMUSCULAR | Status: AC
  Administered 2020-11-11: 1000 ug via INTRAMUSCULAR

## 2020-11-11 MED ORDER — SODIUM CHLORIDE 0.9% FLUSH
10.0000 mL | INTRAVENOUS | Status: DC | PRN
  Administered 2020-11-11: 10 mL

## 2020-11-11 MED ORDER — ACETAMINOPHEN 325 MG PO TABS
650.0000 mg | ORAL_TABLET | Freq: Once | ORAL | Status: AC
  Administered 2020-11-11: 650 mg via ORAL

## 2020-11-11 NOTE — Progress Notes (Signed)
Potassium 3.1. pt denies diarrhea.  Per MD pt to increase potassium rich foods and we will re access in 1 month.  RN educated pt and verbalized understanding.

## 2020-11-11 NOTE — Patient Instructions (Signed)
[facility]  Discharge Instructions: Thank you for choosing [facility] to provide your oncology and hematology care.   If you have a lab appointment with the Cancer Center, please go directly to the Cancer Center and check in at the registration area.   Wear comfortable clothing and clothing appropriate for easy access to any Portacath or PICC line.   We strive to give you quality time with your provider. You may need to reschedule your appointment if you arrive late (15 or more minutes).  Arriving late affects you and other patients whose appointments are after yours.  Also, if you miss three or more appointments without notifying the office, you may be dismissed from the clinic at the provider's discretion.      For prescription refill requests, have your pharmacy contact our office and allow 72 hours for refills to be completed.    Today you received the following chemotherapy and/or immunotherapy agents herceptin      To help prevent nausea and vomiting after your treatment, we encourage you to take your nausea medication as directed.  BELOW ARE SYMPTOMS THAT SHOULD BE REPORTED IMMEDIATELY: *FEVER GREATER THAN 100.4 F (38 C) OR HIGHER *CHILLS OR SWEATING *NAUSEA AND VOMITING THAT IS NOT CONTROLLED WITH YOUR NAUSEA MEDICATION *UNUSUAL SHORTNESS OF BREATH *UNUSUAL BRUISING OR BLEEDING *URINARY PROBLEMS (pain or burning when urinating, or frequent urination) *BOWEL PROBLEMS (unusual diarrhea, constipation, pain near the anus) TENDERNESS IN MOUTH AND THROAT WITH OR WITHOUT PRESENCE OF ULCERS (sore throat, sores in mouth, or a toothache) UNUSUAL RASH, SWELLING OR PAIN  UNUSUAL VAGINAL DISCHARGE OR ITCHING   Items with * indicate a potential emergency and should be followed up as soon as possible or go to the Emergency Department if any problems should occur.  Please show the CHEMOTHERAPY ALERT CARD or IMMUNOTHERAPY ALERT CARD at check-in to  the Emergency Department and triage nurse.  Should you have questions after your visit or need to cancel or reschedule your appointment, please contact [facility]  Dept: [phone]  and follow the prompts.  Office hours are 8:00 a.m. to 4:30 p.m. Monday - Friday. Please note that voicemails left after 4:00 p.m. may not be returned until the following business day.  We are closed weekends and major holidays. You have access to a nurse at all times for urgent questions. Please call the main number to the clinic Dept: [phone] and follow the prompts.   For any non-urgent questions, you may also contact your provider using MyChart. We now offer e-Visits for anyone 18 and older to request care online for non-urgent symptoms. For details visit [email or web address].   Also download the MyChart app! Go to the app store, search "MyChart", open the app, select [facility], and log in with your MyChart username and password.  Due to Covid, a mask is required upon entering the hospital/clinic. If you do not have a mask, one will be given to you upon arrival. For doctor visits, patients may have 1 support person aged 18 or older with them. For treatment visits, patients cannot have anyone with them due to current Covid guidelines and our immunocompromised population.   

## 2020-11-11 NOTE — Progress Notes (Signed)
Ok to proceed today without labs per Dr.Gudena,

## 2020-11-14 ENCOUNTER — Encounter: Payer: Self-pay | Admitting: *Deleted

## 2020-11-14 NOTE — Progress Notes (Signed)
Location of Breast Cancer: Malignant neoplasm of lower-outer quadrant of RIGHT breast, estrogen receptor positive    Histology per Pathology Report:  06/02/2020 FINAL MICROSCOPIC DIAGNOSIS:  A. BREAST, RIGHT, LUMPECTOMY:  - Invasive and in situ ductal carcinoma, 1.3 cm.  - Margins not involved.  - Biopsy site and biopsy clip.  - See oncology table.  B. BREAST, RIGHT ADDITIONAL ANTERIO-SUPERIOR MARGIN, EXCISION:  - Benign breast tissue.  - No evidence of malignancy.  - Final anterior-superior margin free of tumor.  C. BREAST, RIGHT ADDITIONAL MEDIAL-POSTERIOR MARGIN, EXCISION:  - Benign breast tissue.  - No evidence of malignancy.  - Final medial-posterior margin free of tumor.  D. BREAST, RIGHT ADDITIONAL INFERIOr-LATERAL MARGIN, EXCISION:  - Benign breast tissue.  - No evidence of malignancy.  - Final inferior-lateral margin free of tumor.  E. LYMPH NODE, RIGHT AXILLARY, SENTINEL, EXCISION:  - One lymph node with no metastatic carcinoma (0/1).    Receptor Status: ER(15%), PR (0%), Her2-neu (Positive via IHC), Ki-67(80%)   Did patient present with symptoms (if so, please note symptoms) or was this found on screening mammography?:  Screening mammogram showed a 1.0cm mass at the 6 o'clock position in the right breast. Diagnostic mammogram and US showed the 1.2cm mass at the 6 o'clock position in the right breast.   Past/Anticipated interventions by surgeon, if any: 06/02/2020 Dr. Marcello Moores [facility] --Right breast seed localized lumpectomy with right axillary sentinel lymph node mapping  --Placement of right 8 French internal jugular port with C arm and ultrasound guidance   Past/Anticipated interventions by medical oncology, if any:  Under care of Dr. Nicholas Lose 10/14/2020 --Treatment plan: Adjuvant chemotherapy with Taxol/Herceptin followed by Herceptin maintenance.  Initially had significant pain with 80 mg/m2, held x 3 weeks then dose reduced to 50 mg/m2 Adjuvant radiation  therapy Adjuvant antiestrogen therapy --Current Treatment: Cycle 11 Taxol-Herceptin --Chemo Toxicities:  Fatigue: stable, managing with energy conservation. Anemia: stable Mild peripheral neuropathy: It has not progressed.  She has 1 more dose of Taxol Herceptin after and after that she could start radiation. We will send a referral for radiation. She will then be set up for every 3-week Herceptin's   Lymphedema issues, if any:  Patient denies     Pain issues, if any: Patient denies   SAFETY ISSUES: Prior radiation? No Pacemaker/ICD? No Possible current pregnancy? No--IUD in place Is the patient on methotrexate? No   Current Complaints / other details:  Nothing else of note

## 2020-11-14 NOTE — Progress Notes (Signed)
Radiation Oncology         [phone] ________________________________  Name: Pamela [name] MRN: [id number]  Date: 11/15/2020  DOB: [birth year]  Follow-Up New Visit Note  Outpatient  CC: [name], MD  Magrinat, Virgie Dad, MD  Diagnosis:      ICD-10-CM   1. Malignant neoplasm of lower-outer quadrant of right breast of female, estrogen receptor positive ([place])  C50.511    Z17.0         Cancer Staging Malignant neoplasm of lower-outer quadrant of right breast of female, estrogen receptor positive ([place]) Staging form: Breast, AJCC 8th Edition - Clinical stage from 05/11/2020: Stage IA (cT1b, cN0, cM0, G3, ER+, PR-, HER2+) - Signed by Nicholas Lose, MD on 05/11/2020 Stage prefix: Initial diagnosis - Pathologic stage from 06/02/2020: Stage IA (pT1c, pN0, cM0, G3, ER+, PR-, HER2+) - Signed by Gardenia Phlegm, NP on 06/15/2020 Stage prefix: Initial diagnosis Histologic grading system: 3 grade system   CHIEF COMPLAINT: Here to discuss management of right breast cancer  Narrative:  The patient returns today for follow-up. She was last seen by me for follow-up on 08/05/20 .   She has not undergone any significant imaging studies since consultation.  We had planned her radiation with anticipation of starting it in June but then she changed her mind about systemic therapy and radiation was canceled.  She contiued with systemic therapy as follows: PACLITAXEL + TRASTUZUMAB Q7D / TRASTUZUMAB Q21D  She completed the Taxol about 3 weeks ago and is continuing trastuzumab   She last followed up with Dr. Lindi Adie on 10/14/20 in which she denied all symptoms other than muscle aches, pain, and mild numbness which are well managed.   Symptomatically, the patient reports: severe claustrophobia; she reports some mild symptoms of peripheral neuropathy that wax and wane  Lymphedema issues, if any:  Patient denies    Pain issues, if any: Patient denies  SAFETY ISSUES: Prior radiation?  No Pacemaker/ICD? No Possible current pregnancy? No--IUD in place Is the patient on methotrexate? No  Current Complaints / other details:  Patient has received the first 3 Pfizer vaccines          ALLERGIES:  is allergic to pollen extract, latex, codeine, shellfish allergy, and betadine [povidone iodine].  Meds: Current Outpatient Medications  Medication Sig Dispense Refill   Multiple Vitamin (MULTIVITAMIN) LIQD Take 5 mLs by mouth daily.     amLODipine (NORVASC) 10 MG tablet TAKE 1 TABLET BY MOUTH DAILY. 90 tablet 2   aspirin EC 81 MG tablet Take 1 tablet (81 mg total) by mouth daily. Swallow whole. 30 tablet 11   Azilsartan-Chlorthalidone (EDARBYCLOR) 40-25 MG TABS Take 1 tablet by mouth daily. 90 tablet 2   hydrOXYzine (ATARAX/VISTARIL) 25 MG tablet Take 1 tablet (25 mg total) by mouth every 6 (six) hours as needed for anxiety. 36 tablet 0   levonorgestrel (MIRENA, 52 MG,) 20 MCG/DAY IUD 1 each by Intrauterine route once.     lidocaine-prilocaine (EMLA) cream Apply 1 application topically once.     LORazepam (ATIVAN) 0.5 MG tablet Take 1 tablet 20 minutes before radiation therapy, PRN anxiety.  May take once daily at home the week before treatment to get used to effects. 30 tablet 0   rosuvastatin (CRESTOR) 40 MG tablet Take 1 tablet (40 mg total) by mouth daily. 90 tablet 3   Vitamin D, Ergocalciferol, (DRISDOL) 1.25 MG (50000 UNIT) CAPS capsule Take 1 capsule (50,000 Units total) by mouth every 7 (seven) days. 24 capsule  1   No current facility-administered medications for this encounter.    Physical Findings:  height is 5' 9"  (1.753 m) and weight is 329 lb (149.2 kg) (abnormal). Her oral temperature is 98.5 F (36.9 C). Her blood pressure is 148/82 (abnormal) and her pulse is 86. Her respiration is 18 and oxygen saturation is 100%. .     General: Alert and oriented, in no acute distress   Breast exam reveals no concerning skin lesions over right breast, surgical scars are well  approximated  Lab Findings: Lab Results  Component Value Date   WBC 9.2 11/11/2020   HGB 10.8 (L) 11/11/2020   HCT 33.6 (L) 11/11/2020   MCV 85.5 11/11/2020   PLT 259 11/11/2020     Radiographic Findings: No results found.  Impression/Plan: Right breast cancer  We discussed adjuvant radiotherapy today.  She initially changed her mind about systemic therapy which caused [place] to cancel her previous radiation treatments.  Now that she is done with Taxol, I again recommend 4 weeks of RT to the right breast in order to reduce risk of locoregional recurrence by 2/3.  I  again reviewed the logistics, benefits, risks, and potential side effects of this treatment in detail. Risks may include but not necessary be limited to acute and late injury tissue in the radiation fields such as skin irritation (change in color/pigmentation, itching, dryness, pain, peeling). She may experience fatigue. We also discussed possible risk of long term cosmetic changes or scar tissue. There is also a smaller risk for lung toxicity, lymphedema, musculoskeletal changes, rib fragility or induction of a second malignancy, late chronic non-healing soft tissue wound.    The patient asked good questions which I answered to her satisfaction. She is enthusiastic about proceeding with treatment.  We will proceed with an up-to-date CT simulation today and anticipate starting her treatment next week.  On date of service, in total, I spent 25 minutes on this encounter. Patient was seen in person.  _____________________________________   Eppie Gibson, MD  This document serves as a record of services personally performed by Eppie Gibson, MD. It was created on her behalf by Roney Mans, a trained medical scribe. The creation of this record is based on the scribe's personal observations and the provider's statements to them. This document has been checked and approved by the attending provider.

## 2020-11-15 ENCOUNTER — Encounter: Payer: Self-pay | Admitting: Radiation Oncology

## 2020-11-15 ENCOUNTER — Other Ambulatory Visit: Payer: Self-pay

## 2020-11-15 ENCOUNTER — Ambulatory Visit
Admission: RE | Admit: 2020-11-15 | Discharge: 2020-11-15 | Disposition: A | Discharge status: Home / Self Care | Payer: 59 | Source: Ambulatory Visit | Attending: Radiation Oncology | Admitting: Radiation Oncology

## 2020-11-15 VITALS — BP 148/82 | HR 86 | Temp 98.5°F | Resp 18 | Ht 69.0 in | Wt 329.0 lb

## 2020-11-15 DIAGNOSIS — C50511 Malignant neoplasm of lower-outer quadrant of right female breast: Secondary | ICD-10-CM

## 2020-11-15 DIAGNOSIS — Z170 Estrogen receptor positive status [ER+]: Secondary | ICD-10-CM | POA: Insufficient documentation

## 2020-11-15 DIAGNOSIS — Z79899 Other long term (current) drug therapy: Secondary | ICD-10-CM | POA: Diagnosis not present

## 2020-11-15 DIAGNOSIS — Z923 Personal history of irradiation: Secondary | ICD-10-CM | POA: Diagnosis not present

## 2020-11-15 DIAGNOSIS — Z7982 Long term (current) use of aspirin: Secondary | ICD-10-CM | POA: Insufficient documentation

## 2020-11-16 ENCOUNTER — Telehealth: Payer: Self-pay | Admitting: *Deleted

## 2020-11-16 ENCOUNTER — Other Ambulatory Visit: Payer: Self-pay

## 2020-11-16 ENCOUNTER — Inpatient Hospital Stay: Payer: 59

## 2020-11-16 ENCOUNTER — Ambulatory Visit
Admission: RE | Admit: 2020-11-16 | Discharge: 2020-11-16 | Disposition: A | Discharge status: Home / Self Care | Payer: 59 | Source: Ambulatory Visit | Attending: Radiation Oncology | Admitting: Radiation Oncology

## 2020-11-16 DIAGNOSIS — C50511 Malignant neoplasm of lower-outer quadrant of right female breast: Secondary | ICD-10-CM

## 2020-11-16 DIAGNOSIS — Z170 Estrogen receptor positive status [ER+]: Secondary | ICD-10-CM

## 2020-11-16 LAB — CBC WITH DIFFERENTIAL (CANCER CENTER ONLY)
Abs Immature Granulocytes: 0.1 10*3/uL — ABNORMAL HIGH (ref 0.00–0.07)
Basophils Absolute: 0 10*3/uL (ref 0.0–0.1)
Basophils Relative: 1 %
Eosinophils Absolute: 0.2 10*3/uL (ref 0.0–0.5)
Eosinophils Relative: 3 %
HCT: 36.3 % (ref 36.0–46.0)
Hemoglobin: 11.2 g/dL — ABNORMAL LOW (ref 12.0–15.0)
Immature Granulocytes: 1 %
Lymphocytes Relative: 23 %
Lymphs Abs: 1.9 10*3/uL (ref 0.7–4.0)
MCH: 26.9 pg (ref 26.0–34.0)
MCHC: 30.9 g/dL (ref 30.0–36.0)
MCV: 87.1 fL (ref 80.0–100.0)
Monocytes Absolute: 0.8 10*3/uL (ref 0.1–1.0)
Monocytes Relative: 10 %
Neutro Abs: 5 10*3/uL (ref 1.7–7.7)
Neutrophils Relative %: 62 %
Platelet Count: 275 10*3/uL (ref 150–400)
RBC: 4.17 MIL/uL (ref 3.87–5.11)
RDW: 16.8 % — ABNORMAL HIGH (ref 11.5–15.5)
WBC Count: 8 10*3/uL (ref 4.0–10.5)
nRBC: 0.4 % — ABNORMAL HIGH (ref 0.0–0.2)

## 2020-11-16 LAB — CMP (CANCER CENTER ONLY)
ALT: 23 U/L (ref 0–44)
AST: 21 U/L (ref 15–41)
Albumin: 4.2 g/dL (ref 3.5–5.0)
Alkaline Phosphatase: 64 U/L (ref 38–126)
Anion gap: 11 (ref 5–15)
BUN: 10 mg/dL (ref 6–20)
CO2: 30 mmol/L (ref 22–32)
Calcium: 10.4 mg/dL — ABNORMAL HIGH (ref 8.9–10.3)
Chloride: 103 mmol/L (ref 98–111)
Creatinine: 0.87 mg/dL (ref 0.44–1.00)
GFR, Estimated: >60 mL/min (ref >60)
Glucose, Bld: 102 mg/dL — ABNORMAL HIGH (ref 70–99)
Potassium: 4 mmol/L (ref 3.5–5.1)
Sodium: 144 mmol/L (ref 135–145)
Total Bilirubin: 1 mg/dL (ref 0.3–1.2)
Total Protein: 7.5 g/dL (ref 6.5–8.1)

## 2020-11-16 LAB — PREGNANCY, URINE: Preg Test, Ur: NEGATIVE

## 2020-11-16 MED ORDER — HEPARIN SOD (PORK) LOCK FLUSH 100 UNIT/ML IV SOLN: 500.0000 [IU] | Freq: Once | INTRAVENOUS | Status: AC

## 2020-11-16 MED ORDER — SODIUM CHLORIDE 0.9% FLUSH: 10.0000 mL | Freq: Once | INTRAVENOUS | Status: AC

## 2020-11-16 NOTE — Telephone Encounter (Signed)
CALLED PATIENT TO ASK ABOUT COMING IN FOR LAB TODAY, PATIENT AGREED TO COME IN TODAY @ 11:15 AM

## 2020-11-21 DIAGNOSIS — C50511 Malignant neoplasm of lower-outer quadrant of right female breast: Secondary | ICD-10-CM | POA: Diagnosis not present

## 2020-11-22 ENCOUNTER — Ambulatory Visit
Admission: RE | Admit: 2020-11-22 | Discharge: 2020-11-22 | Disposition: A | Discharge status: Home / Self Care | Payer: 59 | Source: Ambulatory Visit | Attending: Radiation Oncology | Admitting: Radiation Oncology

## 2020-11-22 ENCOUNTER — Other Ambulatory Visit: Payer: Self-pay

## 2020-11-22 DIAGNOSIS — C50511 Malignant neoplasm of lower-outer quadrant of right female breast: Secondary | ICD-10-CM | POA: Diagnosis not present

## 2020-11-23 ENCOUNTER — Ambulatory Visit
Admission: RE | Admit: 2020-11-23 | Discharge: 2020-11-23 | Disposition: A | Discharge status: Home / Self Care | Payer: 59 | Source: Ambulatory Visit | Attending: Radiation Oncology | Admitting: Radiation Oncology

## 2020-11-23 DIAGNOSIS — C50511 Malignant neoplasm of lower-outer quadrant of right female breast: Secondary | ICD-10-CM | POA: Diagnosis not present

## 2020-11-24 ENCOUNTER — Ambulatory Visit
Admission: RE | Admit: 2020-11-24 | Discharge: 2020-11-24 | Disposition: A | Discharge status: Home / Self Care | Payer: 59 | Source: Ambulatory Visit | Attending: Radiation Oncology | Admitting: Radiation Oncology

## 2020-11-24 DIAGNOSIS — C50511 Malignant neoplasm of lower-outer quadrant of right female breast: Secondary | ICD-10-CM | POA: Diagnosis not present

## 2020-11-25 ENCOUNTER — Other Ambulatory Visit: Payer: Self-pay

## 2020-11-25 ENCOUNTER — Ambulatory Visit
Admission: RE | Admit: 2020-11-25 | Discharge: 2020-11-25 | Disposition: A | Discharge status: Home / Self Care | Payer: 59 | Source: Ambulatory Visit | Attending: Radiation Oncology | Admitting: Radiation Oncology

## 2020-11-25 DIAGNOSIS — C50511 Malignant neoplasm of lower-outer quadrant of right female breast: Secondary | ICD-10-CM | POA: Diagnosis not present

## 2020-11-28 ENCOUNTER — Other Ambulatory Visit: Payer: Self-pay

## 2020-11-28 ENCOUNTER — Ambulatory Visit
Admission: RE | Admit: 2020-11-28 | Discharge: 2020-11-28 | Disposition: A | Discharge status: Home / Self Care | Payer: 59 | Source: Ambulatory Visit | Attending: Radiation Oncology | Admitting: Radiation Oncology

## 2020-11-28 DIAGNOSIS — C50511 Malignant neoplasm of lower-outer quadrant of right female breast: Secondary | ICD-10-CM

## 2020-11-28 MED ORDER — RADIAPLEXRX EX GEL: Freq: Once | CUTANEOUS | Status: AC

## 2020-11-28 MED ORDER — ALRA NON-METALLIC DEODORANT (RAD-ONC)
1.0000 "application " | Freq: Once | TOPICAL | Status: AC
  Administered 2020-11-28: 1 via TOPICAL

## 2020-11-28 NOTE — Progress Notes (Signed)
Pt here for patient teaching.    Pt given Radiation and You booklet, skin care instructions, Alra deodorant, and Radiaplex gel.    Reviewed areas of pertinence such as fatigue, hair loss, skin changes, breast tenderness, and breast swelling .   Pt able to give teach back of to pat skin, use unscented/gentle soap, and drink plenty of water,apply Radiaplex bid, avoid applying anything to skin within 4 hours of treatment, avoid wearing an under wire bra, and to use an electric razor if they must shave.   Pt demonstrated understanding and verbalizes understanding of information given and will contact nursing with any questions or concerns.    [email or web address]/index         

## 2020-11-29 ENCOUNTER — Ambulatory Visit
Admission: RE | Admit: 2020-11-29 | Discharge: 2020-11-29 | Disposition: A | Discharge status: Home / Self Care | Payer: 59 | Source: Ambulatory Visit | Attending: Radiation Oncology | Admitting: Radiation Oncology

## 2020-11-29 DIAGNOSIS — C50511 Malignant neoplasm of lower-outer quadrant of right female breast: Secondary | ICD-10-CM | POA: Diagnosis not present

## 2020-11-30 ENCOUNTER — Ambulatory Visit
Admission: RE | Admit: 2020-11-30 | Discharge: 2020-11-30 | Disposition: A | Discharge status: Home / Self Care | Payer: 59 | Source: Ambulatory Visit | Attending: Radiation Oncology | Admitting: Radiation Oncology

## 2020-11-30 DIAGNOSIS — C50511 Malignant neoplasm of lower-outer quadrant of right female breast: Secondary | ICD-10-CM | POA: Diagnosis not present

## 2020-11-30 NOTE — Progress Notes (Signed)
Patient Care Team: [name], MD as PCP - General (Internal Medicine) Mcarthur Rossetti, MD as Consulting Physician (Orthopedic Surgery) Mauro Kaufmann, RN as Oncology Nurse Navigator Rockwell Germany, RN as Oncology Nurse Navigator [name], MD as Consulting Physician (General Surgery) Nicholas Lose, MD as Consulting Physician (Hematology and Oncology) Eppie Gibson, MD as Attending Physician (Radiation Oncology)  DIAGNOSIS:    ICD-10-CM   1. Malignant neoplasm of lower-outer quadrant of right breast of female, estrogen receptor positive ([place])  C50.511 ECHOCARDIOGRAM COMPLETE   Z17.0       SUMMARY OF ONCOLOGIC HISTORY: Oncology History  Malignant neoplasm of lower-outer quadrant of right breast of female, estrogen receptor positive ([place])  05/03/2020 Initial Diagnosis   Screening mammogram showed a 1.0cm mass at the 6 o'clock position in the right breast. Diagnostic mammogram and US showed the 1.2cm mass at the 6 o'clock position in the right breast. Biopsy showed IDC, grade 3, HER-2 positive (3+), ER 15% weak, PR-, Ki67 80%.   05/11/2020 Cancer Staging   Staging form: Breast, AJCC 8th Edition - Clinical stage from 05/11/2020: Stage IA (cT1b, cN0, cM0, G3, ER+, PR-, HER2+) - Signed by Nicholas Lose, MD on 05/11/2020 Stage prefix: Initial diagnosis   05/19/2020 Genetic Testing   Negative hereditary cancer genetic testing: no pathogenic variants detected in [facility] Breast STAT Panel and Common Hereditary Cancers Panel.  The report dates are May 19, 2020 (STAT) and May 23, 2020 (Common Hereditary).   The Common Hereditary Cancers Panel offered by [facility] includes sequencing and/or deletion duplication testing of the following 47 genes: APC, ATM, AXIN2, BARD1, BMPR1A, BRCA1, BRCA2, BRIP1, CDH1, CDK4, CDKN2A (p14ARF), CDKN2A (p16INK4a), CHEK2, CTNNA1, DICER1, EPCAM (Deletion/duplication testing only), GREM1 (promoter region deletion/duplication testing only), GREM1, HOXB13,  KIT, MEN1, MLH1, MSH2, MSH3, MSH6, MUTYH, NBN, NF1, NHTL1, PALB2, PDGFRA, PMS2, POLD1, POLE, PTEN, RAD50, RAD51C, RAD51D, SDHA, SDHB, SDHC, SDHD, SMAD4, SMARCA4. STK11, TP53, TSC1, TSC2, and VHL.  The following genes were evaluated for sequence changes only: SDHA and HOXB13 c.251G>A variant only.es only: SDHA and HOXB13 c.251G>A variant only.   06/02/2020 Surgery   Right lumpectomy ([facility]): invasive and in situ ductal carcinoma, 1.3cm, clear margins, 1 right axillary lymph node negative for carcinoma.   06/02/2020 Cancer Staging   Staging form: Breast, AJCC 8th Edition - Pathologic stage from 06/02/2020: Stage IA (pT1c, pN0, cM0, G3, ER+, PR-, HER2+) - Signed by Gardenia Phlegm, NP on 06/15/2020 Stage prefix: Initial diagnosis Histologic grading system: 3 grade system   07/08/2020 - 10/21/2020 Chemotherapy   Taxol Herceptin followed by Herceptin maintenance   11/11/2020 -  Chemotherapy   Herceptin maintenance         CHIEF COMPLIANT:  Herceptin  INTERVAL HISTORY: Pamela [name] is a 55 y.o. with above-mentioned history of right breast cancer treated with lumpectomy, and is currently on  Herceptin maintenance. She reports to the clinic today for treatment.  She is tolerating Herceptin extremely well.  Her energy levels are recovering.  She is currently on radiation and is tolerating it fairly well.  Does not have any major issues with neuropathy.  ALLERGIES:  is allergic to pollen extract, latex, codeine, shellfish allergy, and betadine [povidone iodine].  MEDICATIONS:  Current Outpatient Medications  Medication Sig Dispense Refill   amLODipine (NORVASC) 10 MG tablet TAKE 1 TABLET BY MOUTH DAILY. 90 tablet 2   aspirin EC 81 MG tablet Take 1 tablet (81 mg total) by mouth daily. Swallow whole. 30 tablet 11  Azilsartan-Chlorthalidone (EDARBYCLOR) 40-25 MG TABS Take 1 tablet by mouth daily. 90 tablet 2   hydrOXYzine (ATARAX/VISTARIL) 25 MG tablet Take 1 tablet (25 mg total) by  mouth every 6 (six) hours as needed for anxiety. 36 tablet 0   levonorgestrel (MIRENA, 52 MG,) 20 MCG/DAY IUD 1 each by Intrauterine route once.     lidocaine-prilocaine (EMLA) cream Apply 1 application topically once.     LORazepam (ATIVAN) 0.5 MG tablet Take 1 tablet 20 minutes before radiation therapy, PRN anxiety.  May take once daily at home the week before treatment to get used to effects. 30 tablet 0   Multiple Vitamin (MULTIVITAMIN) LIQD Take 5 mLs by mouth daily.     rosuvastatin (CRESTOR) 40 MG tablet Take 1 tablet (40 mg total) by mouth daily. 90 tablet 3   Vitamin D, Ergocalciferol, (DRISDOL) 1.25 MG (50000 UNIT) CAPS capsule Take 1 capsule (50,000 Units total) by mouth every 7 (seven) days. 24 capsule 1   No current facility-administered medications for this visit.   Facility-Administered Medications Ordered in Other Visits  Medication Dose Route Frequency Provider Last Rate Last Admin   heparin lock flush 100 unit/mL  500 Units Intracatheter Once Nicholas Lose, MD       sodium chloride flush (NS) 0.9 % injection 10 mL  10 mL Intracatheter Once Nicholas Lose, MD        PHYSICAL EXAMINATION: ECOG PERFORMANCE STATUS: 1 - Symptomatic but completely ambulatory  Vitals:   12/02/20 0853  BP: (!) 148/74  Pulse: 69  Resp: 18  Temp: 97.8 F (36.6 C)  SpO2: 100%   Filed Weights   12/02/20 0853  Weight: (!) 328 lb 9.6 oz (149.1 kg)     LABORATORY DATA:  I have reviewed the data as listed CMP Latest Ref Rng & Units 11/16/2020 11/11/2020 10/21/2020  Glucose 70 - 99 mg/dL 102(H) 112(H) 114(H)  BUN 6 - 20 mg/dL _0 Creatinine 0.44 - 1.00 mg/dL 0.87 0.99 0.85  Sodium 135 - 145 mmol/L 144 142 141  Potassium 3.5 - 5.1 mmol/L 4.0 3.1(L) 3.6  Chloride 98 - 111 mmol/L 103 102 104  CO2 22 - 32 mmol/L _1 Calcium 8.9 - 10.3 mg/dL 10.4(H) 9.9 9.4  Total Protein 6.5 - 8.1 g/dL 7.5 7.1 6.7  Total Bilirubin 0.3 - 1.2 mg/dL 1.0 1.2 1.0  Alkaline Phos 38 - 126 U/L 64 63 63  AST  15 - 41 U/L _2 ALT 0 - 44 U/L _3 Lab Results  Component Value Date   WBC 5.1 12/02/2020   HGB 10.7 (L) 12/02/2020   HCT 33.4 (L) 12/02/2020   MCV 85.2 12/02/2020   PLT 205 12/02/2020   NEUTROABS 3.0 12/02/2020    ASSESSMENT & PLAN:  Malignant neoplasm of lower-outer quadrant of right breast of female, estrogen receptor positive ([place]) 05/03/2020:Screening mammogram showed a 1.0cm mass at the 6 o'clock position in the right breast. Diagnostic mammogram and US showed the 1.2cm mass at the 6 o'clock position in the right breast. Biopsy showed IDC, grade 3, HER-2 positive (3+), ER 15% weak, PR-, Ki67 80%.   06/02/2020:Right lumpectomy ([facility]): invasive and in situ ductal carcinoma, 1.3cm, clear margins, 1 right axillary lymph node negative for carcinoma. ER 15% weak, PR-,HER-2 positive (3+) Ki67 80%.   Treatment plan: 1.  Adjuvant chemotherapy with Taxol Herceptin followed by Herceptin maintenance.  Stopped after 11 cycles of Taxol  2.  Adjuvant radiation  therapy 11/23/2020-12/19/2020 3.  Adjuvant antiestrogen therapy ----------------------------------------------------------------------------------------------------- Current Treatment Herceptin maintenance ECHO 08/30/20: EF 60-65% Herceptin toxicities: None Chemotherapy-induced anemia: Monitoring hemoglobin today is 10.7. Return to clinic every 3 weeks for Herceptin every 6 weeks for follow-up with me.    Orders Placed This Encounter  Procedures   ECHOCARDIOGRAM COMPLETE    Standing Status:   Future    Standing Expiration Date:   12/01/2021    Order Specific Question:   Where should this test be performed    Answer:   [facility]    Order Specific Question:   Perflutren DEFINITY (image enhancing agent) should be administered unless hypersensitivity or allergy exist    Answer:   Administer Perflutren    Order Specific Question:   Is a special reader required? (athlete or structural heart)    Answer:   No    Order  Specific Question:   Does this study need to be read by the Structural team/Level 3 readers?    Answer:   No    Order Specific Question:   Reason for exam-Echo    Answer:   Chemo  Z09    Order Specific Question:   Other Comments    Answer:   patient on trastuzumab. Please contact patient once exam has been scheduled.  Thank you.   The patient has a good understanding of the overall plan. she agrees with it. she will call with any problems that may develop before the next visit here.  Total time spent: 30 mins including face to face time and time spent for planning, charting and coordination of care  Rulon Eisenmenger, MD, MPH 12/02/2020  I, Thana Ates, am acting as scribe for Dr. Nicholas Lose.  I have reviewed the above documentation for accuracy and completeness, and I agree with the above.

## 2020-12-01 ENCOUNTER — Other Ambulatory Visit: Payer: Self-pay

## 2020-12-01 ENCOUNTER — Ambulatory Visit
Admission: RE | Admit: 2020-12-01 | Discharge: 2020-12-01 | Disposition: A | Discharge status: Home / Self Care | Payer: 59 | Source: Ambulatory Visit | Attending: Radiation Oncology | Admitting: Radiation Oncology

## 2020-12-01 ENCOUNTER — Ambulatory Visit: Payer: 59

## 2020-12-01 DIAGNOSIS — C50511 Malignant neoplasm of lower-outer quadrant of right female breast: Secondary | ICD-10-CM | POA: Diagnosis not present

## 2020-12-02 ENCOUNTER — Ambulatory Visit
Admission: RE | Admit: 2020-12-02 | Discharge: 2020-12-02 | Disposition: A | Discharge status: Home / Self Care | Payer: 59 | Source: Ambulatory Visit | Attending: Radiation Oncology | Admitting: Radiation Oncology

## 2020-12-02 ENCOUNTER — Inpatient Hospital Stay (HOSPITAL_BASED_OUTPATIENT_CLINIC_OR_DEPARTMENT_OTHER): Payer: 59 | Admitting: Hematology and Oncology

## 2020-12-02 ENCOUNTER — Inpatient Hospital Stay: Payer: 59

## 2020-12-02 VITALS — BP 148/74 | HR 69 | Temp 97.8°F | Resp 18 | Ht 69.0 in | Wt 328.6 lb

## 2020-12-02 DIAGNOSIS — C50511 Malignant neoplasm of lower-outer quadrant of right female breast: Secondary | ICD-10-CM

## 2020-12-02 DIAGNOSIS — Z5112 Encounter for antineoplastic immunotherapy: Secondary | ICD-10-CM | POA: Diagnosis not present

## 2020-12-02 DIAGNOSIS — Z170 Estrogen receptor positive status [ER+]: Secondary | ICD-10-CM

## 2020-12-02 DIAGNOSIS — Z95828 Presence of other vascular implants and grafts: Secondary | ICD-10-CM

## 2020-12-02 LAB — CBC WITH DIFFERENTIAL (CANCER CENTER ONLY)
Abs Immature Granulocytes: 0.03 10*3/uL (ref 0.00–0.07)
Basophils Absolute: 0.1 10*3/uL (ref 0.0–0.1)
Basophils Relative: 1 %
Eosinophils Absolute: 0.2 10*3/uL (ref 0.0–0.5)
Eosinophils Relative: 4 %
HCT: 33.4 % — ABNORMAL LOW (ref 36.0–46.0)
Hemoglobin: 10.7 g/dL — ABNORMAL LOW (ref 12.0–15.0)
Immature Granulocytes: 1 %
Lymphocytes Relative: 21 %
Lymphs Abs: 1.1 10*3/uL (ref 0.7–4.0)
MCH: 27.3 pg (ref 26.0–34.0)
MCHC: 32 g/dL (ref 30.0–36.0)
MCV: 85.2 fL (ref 80.0–100.0)
Monocytes Absolute: 0.8 10*3/uL (ref 0.1–1.0)
Monocytes Relative: 15 %
Neutro Abs: 3 10*3/uL (ref 1.7–7.7)
Neutrophils Relative %: 58 %
Platelet Count: 205 10*3/uL (ref 150–400)
RBC: 3.92 MIL/uL (ref 3.87–5.11)
RDW: 15.5 % (ref 11.5–15.5)
WBC Count: 5.1 10*3/uL (ref 4.0–10.5)
nRBC: 0 % (ref 0.0–0.2)

## 2020-12-02 LAB — CMP (CANCER CENTER ONLY)
ALT: 19 U/L (ref 0–44)
AST: 23 U/L (ref 15–41)
Albumin: 3.9 g/dL (ref 3.5–5.0)
Alkaline Phosphatase: 67 U/L (ref 38–126)
Anion gap: 11 (ref 5–15)
BUN: 10 mg/dL (ref 6–20)
CO2: 27 mmol/L (ref 22–32)
Calcium: 9.6 mg/dL (ref 8.9–10.3)
Chloride: 103 mmol/L (ref 98–111)
Creatinine: 0.88 mg/dL (ref 0.44–1.00)
GFR, Estimated: >60 mL/min (ref >60)
Glucose, Bld: 112 mg/dL — ABNORMAL HIGH (ref 70–99)
Potassium: 3.1 mmol/L — ABNORMAL LOW (ref 3.5–5.1)
Sodium: 141 mmol/L (ref 135–145)
Total Bilirubin: 0.9 mg/dL (ref 0.3–1.2)
Total Protein: 7 g/dL (ref 6.5–8.1)

## 2020-12-02 MED ORDER — DIPHENHYDRAMINE HCL 25 MG PO CAPS
50.0000 mg | ORAL_CAPSULE | Freq: Once | ORAL | Status: DC
  Filled 2020-12-02: qty 2

## 2020-12-02 MED ORDER — TRASTUZUMAB-ANNS CHEMO 150 MG IV SOLR
900.0000 mg | Freq: Once | INTRAVENOUS | Status: AC
  Administered 2020-12-02: 900 mg via INTRAVENOUS
  Filled 2020-12-02: qty 42.86

## 2020-12-02 MED ORDER — DIPHENHYDRAMINE HCL 50 MG/ML IJ SOLN
50.0000 mg | Freq: Once | INTRAMUSCULAR | Status: AC
  Administered 2020-12-02: 50 mg via INTRAVENOUS
  Filled 2020-12-02: qty 1

## 2020-12-02 MED ORDER — LIDOCAINE-PRILOCAINE 2.5-2.5 % EX CREA: 1.0000 "application " | TOPICAL_CREAM | Freq: Once | CUTANEOUS | 1 refills | Status: AC

## 2020-12-02 MED ORDER — HEPARIN SOD (PORK) LOCK FLUSH 100 UNIT/ML IV SOLN
500.0000 [IU] | Freq: Once | INTRAVENOUS | Status: AC | PRN
  Administered 2020-12-02: 500 [IU]

## 2020-12-02 MED ORDER — SODIUM CHLORIDE 0.9% FLUSH
10.0000 mL | INTRAVENOUS | Status: DC | PRN
  Administered 2020-12-02: 10 mL

## 2020-12-02 MED ORDER — SODIUM CHLORIDE 0.9 % IV SOLN: Freq: Once | INTRAVENOUS | Status: AC

## 2020-12-02 MED ORDER — ACETAMINOPHEN 325 MG PO TABS
650.0000 mg | ORAL_TABLET | Freq: Once | ORAL | Status: AC
  Administered 2020-12-02: 650 mg via ORAL
  Filled 2020-12-02: qty 2

## 2020-12-02 MED ORDER — SODIUM CHLORIDE 0.9 % IV SOLN: 50.0000 mg | Freq: Once | INTRAVENOUS | Status: DC

## 2020-12-02 MED ORDER — SODIUM CHLORIDE 0.9% FLUSH
10.0000 mL | Freq: Once | INTRAVENOUS | Status: AC
  Administered 2020-12-02: 10 mL

## 2020-12-02 NOTE — Assessment & Plan Note (Signed)
05/03/2020:Screening mammogram showed a 1.0cm mass at the 6 o'clock position in the right breast. Diagnostic mammogram and US showed the 1.2cm mass at the 6 o'clock position in the right breast. Biopsy showed IDC, grade 3, HER-2 positive (3+), ER 15% weak, PR-, Ki67 80%.  06/02/2020:Right lumpectomy ([facility]): invasive and in situ ductal carcinoma, 1.3cm, clear margins, 1 right axillary lymph node negative for carcinoma. ER 15% weak, PR-,HER-2 positive (3+) Ki67 80%.  Treatment plan: 1.Adjuvant chemotherapy with Taxol Herceptinfollowed byHerceptin maintenance.  Stopped after 11 cycles of Taxol  2.Adjuvant radiation therapy 11/23/2020-12/19/2020 3.Adjuvant antiestrogen therapy ----------------------------------------------------------------------------------------------------- CurrentTreatment Herceptin maintenance ECHO6/28/22: EF 60-65% Herceptin toxicities: None Return to clinic every 3 weeks for Herceptin every 6 weeks for follow-up with me.

## 2020-12-02 NOTE — Patient Instructions (Signed)
[facility] MEDICAL ONCOLOGY  Discharge Instructions: Thank you for choosing [facility] to provide your oncology and hematology care.   If you have a lab appointment with the Cancer Center, please go directly to the Cancer Center and check in at the registration area.   Wear comfortable clothing and clothing appropriate for easy access to any Portacath or PICC line.   We strive to give you quality time with your provider. You may need to reschedule your appointment if you arrive late (15 or more minutes).  Arriving late affects you and other patients whose appointments are after yours.  Also, if you miss three or more appointments without notifying the office, you may be dismissed from the clinic at the provider's discretion.      For prescription refill requests, have your pharmacy contact our office and allow 72 hours for refills to be completed.    Today you received the following chemotherapy and/or immunotherapy agents herceptin      To help prevent nausea and vomiting after your treatment, we encourage you to take your nausea medication as directed.  BELOW ARE SYMPTOMS THAT SHOULD BE REPORTED IMMEDIATELY: *FEVER GREATER THAN 100.4 F (38 C) OR HIGHER *CHILLS OR SWEATING *NAUSEA AND VOMITING THAT IS NOT CONTROLLED WITH YOUR NAUSEA MEDICATION *UNUSUAL SHORTNESS OF BREATH *UNUSUAL BRUISING OR BLEEDING *URINARY PROBLEMS (pain or burning when urinating, or frequent urination) *BOWEL PROBLEMS (unusual diarrhea, constipation, pain near the anus) TENDERNESS IN MOUTH AND THROAT WITH OR WITHOUT PRESENCE OF ULCERS (sore throat, sores in mouth, or a toothache) UNUSUAL RASH, SWELLING OR PAIN  UNUSUAL VAGINAL DISCHARGE OR ITCHING   Items with * indicate a potential emergency and should be followed up as soon as possible or go to the Emergency Department if any problems should occur.  Please show the CHEMOTHERAPY ALERT CARD or IMMUNOTHERAPY ALERT CARD at check-in to  the Emergency Department and triage nurse.  Should you have questions after your visit or need to cancel or reschedule your appointment, please contact [facility]  Dept: [phone]  and follow the prompts.  Office hours are 8:00 a.m. to 4:30 p.m. Monday - Friday. Please note that voicemails left after 4:00 p.m. may not be returned until the following business day.  We are closed weekends and major holidays. You have access to a nurse at all times for urgent questions. Please call the main number to the clinic Dept: [phone] and follow the prompts.   For any non-urgent questions, you may also contact your provider using MyChart. We now offer e-Visits for anyone 18 and older to request care online for non-urgent symptoms. For details visit [email or web address].   Also download the MyChart app! Go to the app store, search "MyChart", open the app, select [facility], and log in with your MyChart username and password.  Due to Covid, a mask is required upon entering the hospital/clinic. If you do not have a mask, one will be given to you upon arrival. For doctor visits, patients may have 1 support person aged 18 or older with them. For treatment visits, patients cannot have anyone with them due to current Covid guidelines and our immunocompromised population.   

## 2020-12-05 ENCOUNTER — Ambulatory Visit: Payer: 59

## 2020-12-05 ENCOUNTER — Telehealth: Payer: Self-pay

## 2020-12-05 NOTE — Telephone Encounter (Signed)
-----   Message from Gardenia Phlegm, NP sent at 12/05/2020  2:30 AM EDT ----- Please call patient and encourage to increase potassium intake via potassium rich foods ----- Message ----- From: Interface, Lab In [place] Sent: 12/02/2020   8:53 AM EDT To: Nicholas Lose, MD

## 2020-12-05 NOTE — Telephone Encounter (Signed)
Pt made aware of results; encouraged pt to eat potassium rich foods-- pt was given examples. Verbalized thanks and understanding.

## 2020-12-06 ENCOUNTER — Other Ambulatory Visit: Payer: Self-pay

## 2020-12-06 ENCOUNTER — Ambulatory Visit
Admission: RE | Admit: 2020-12-06 | Discharge: 2020-12-06 | Disposition: A | Discharge status: Home / Self Care | Payer: 59 | Source: Ambulatory Visit | Attending: Radiation Oncology | Admitting: Radiation Oncology

## 2020-12-06 DIAGNOSIS — Z170 Estrogen receptor positive status [ER+]: Secondary | ICD-10-CM | POA: Insufficient documentation

## 2020-12-06 DIAGNOSIS — C50511 Malignant neoplasm of lower-outer quadrant of right female breast: Secondary | ICD-10-CM | POA: Insufficient documentation

## 2020-12-07 ENCOUNTER — Ambulatory Visit
Admission: RE | Admit: 2020-12-07 | Discharge: 2020-12-07 | Disposition: A | Discharge status: Home / Self Care | Payer: 59 | Source: Ambulatory Visit | Attending: Radiation Oncology | Admitting: Radiation Oncology

## 2020-12-07 DIAGNOSIS — C50511 Malignant neoplasm of lower-outer quadrant of right female breast: Secondary | ICD-10-CM | POA: Diagnosis not present

## 2020-12-08 ENCOUNTER — Other Ambulatory Visit: Payer: Self-pay

## 2020-12-08 ENCOUNTER — Ambulatory Visit
Admission: RE | Admit: 2020-12-08 | Discharge: 2020-12-08 | Disposition: A | Discharge status: Home / Self Care | Payer: 59 | Source: Ambulatory Visit | Attending: Radiation Oncology | Admitting: Radiation Oncology

## 2020-12-08 DIAGNOSIS — C50511 Malignant neoplasm of lower-outer quadrant of right female breast: Secondary | ICD-10-CM | POA: Diagnosis not present

## 2020-12-09 ENCOUNTER — Ambulatory Visit (HOSPITAL_COMMUNITY)
Admission: RE | Admit: 2020-12-09 | Discharge: 2020-12-09 | Disposition: A | Discharge status: Home / Self Care | Payer: 59 | Source: Ambulatory Visit | Attending: Hematology and Oncology | Admitting: Hematology and Oncology

## 2020-12-09 ENCOUNTER — Ambulatory Visit
Admission: RE | Admit: 2020-12-09 | Discharge: 2020-12-09 | Disposition: A | Discharge status: Home / Self Care | Payer: 59 | Source: Ambulatory Visit | Attending: Radiation Oncology | Admitting: Radiation Oncology

## 2020-12-09 DIAGNOSIS — I517 Cardiomegaly: Secondary | ICD-10-CM | POA: Diagnosis not present

## 2020-12-09 DIAGNOSIS — Z79899 Other long term (current) drug therapy: Secondary | ICD-10-CM | POA: Insufficient documentation

## 2020-12-09 DIAGNOSIS — Z170 Estrogen receptor positive status [ER+]: Secondary | ICD-10-CM | POA: Diagnosis not present

## 2020-12-09 DIAGNOSIS — C50511 Malignant neoplasm of lower-outer quadrant of right female breast: Secondary | ICD-10-CM

## 2020-12-09 DIAGNOSIS — Z0189 Encounter for other specified special examinations: Secondary | ICD-10-CM

## 2020-12-09 DIAGNOSIS — Z5181 Encounter for therapeutic drug level monitoring: Secondary | ICD-10-CM | POA: Insufficient documentation

## 2020-12-09 LAB — ECHOCARDIOGRAM COMPLETE
Area-P 1/2: 4.17 cm2
S' Lateral: 3 cm

## 2020-12-09 NOTE — Progress Notes (Signed)
  Echocardiogram 2D Echocardiogram has been performed.  Matilde Bash 12/09/2020, 9:01 AM

## 2020-12-12 ENCOUNTER — Other Ambulatory Visit: Payer: Self-pay

## 2020-12-12 ENCOUNTER — Ambulatory Visit
Admission: RE | Admit: 2020-12-12 | Discharge: 2020-12-12 | Disposition: A | Discharge status: Home / Self Care | Payer: 59 | Source: Ambulatory Visit | Attending: Radiation Oncology | Admitting: Radiation Oncology

## 2020-12-12 ENCOUNTER — Ambulatory Visit: Payer: 59 | Admitting: Radiation Oncology

## 2020-12-12 ENCOUNTER — Other Ambulatory Visit: Payer: Self-pay | Admitting: *Deleted

## 2020-12-12 DIAGNOSIS — C50511 Malignant neoplasm of lower-outer quadrant of right female breast: Secondary | ICD-10-CM | POA: Diagnosis not present

## 2020-12-12 DIAGNOSIS — Z5181 Encounter for therapeutic drug level monitoring: Secondary | ICD-10-CM

## 2020-12-12 NOTE — Progress Notes (Signed)
Per MD request RN successfully placed referral to Dr. Haroldine Laws for evaluation and tx of abnormal echo. High priority message sent to HIM team to send referral.

## 2020-12-13 ENCOUNTER — Ambulatory Visit: Payer: 59

## 2020-12-13 ENCOUNTER — Ambulatory Visit
Admission: RE | Admit: 2020-12-13 | Discharge: 2020-12-13 | Disposition: A | Discharge status: Home / Self Care | Payer: 59 | Source: Ambulatory Visit | Attending: Radiation Oncology | Admitting: Radiation Oncology

## 2020-12-13 DIAGNOSIS — C50511 Malignant neoplasm of lower-outer quadrant of right female breast: Secondary | ICD-10-CM | POA: Diagnosis not present

## 2020-12-14 ENCOUNTER — Ambulatory Visit: Payer: 59

## 2020-12-14 ENCOUNTER — Ambulatory Visit
Admission: RE | Admit: 2020-12-14 | Discharge: 2020-12-14 | Disposition: A | Discharge status: Home / Self Care | Payer: 59 | Source: Ambulatory Visit | Attending: Radiation Oncology | Admitting: Radiation Oncology

## 2020-12-14 ENCOUNTER — Other Ambulatory Visit: Payer: Self-pay

## 2020-12-14 DIAGNOSIS — C50511 Malignant neoplasm of lower-outer quadrant of right female breast: Secondary | ICD-10-CM | POA: Diagnosis not present

## 2020-12-15 ENCOUNTER — Ambulatory Visit
Admission: RE | Admit: 2020-12-15 | Discharge: 2020-12-15 | Disposition: A | Discharge status: Home / Self Care | Payer: 59 | Source: Ambulatory Visit | Attending: Radiation Oncology | Admitting: Radiation Oncology

## 2020-12-15 DIAGNOSIS — C50511 Malignant neoplasm of lower-outer quadrant of right female breast: Secondary | ICD-10-CM | POA: Diagnosis not present

## 2020-12-16 ENCOUNTER — Other Ambulatory Visit: Payer: Self-pay

## 2020-12-16 ENCOUNTER — Ambulatory Visit (HOSPITAL_COMMUNITY)
Admission: RE | Admit: 2020-12-16 | Discharge: 2020-12-16 | Disposition: A | Discharge status: Home / Self Care | Payer: 59 | Source: Ambulatory Visit | Attending: Internal Medicine | Admitting: Internal Medicine

## 2020-12-16 ENCOUNTER — Ambulatory Visit
Admission: RE | Admit: 2020-12-16 | Discharge: 2020-12-16 | Disposition: A | Discharge status: Home / Self Care | Payer: 59 | Source: Ambulatory Visit | Attending: Radiation Oncology | Admitting: Radiation Oncology

## 2020-12-16 ENCOUNTER — Encounter (HOSPITAL_COMMUNITY): Payer: Self-pay | Admitting: Internal Medicine

## 2020-12-16 VITALS — BP 124/78 | HR 69 | Wt 324.2 lb

## 2020-12-16 DIAGNOSIS — Z885 Allergy status to narcotic agent status: Secondary | ICD-10-CM | POA: Insufficient documentation

## 2020-12-16 DIAGNOSIS — Z170 Estrogen receptor positive status [ER+]: Secondary | ICD-10-CM | POA: Diagnosis not present

## 2020-12-16 DIAGNOSIS — Z8249 Family history of ischemic heart disease and other diseases of the circulatory system: Secondary | ICD-10-CM | POA: Insufficient documentation

## 2020-12-16 DIAGNOSIS — C50511 Malignant neoplasm of lower-outer quadrant of right female breast: Secondary | ICD-10-CM | POA: Insufficient documentation

## 2020-12-16 DIAGNOSIS — Z7982 Long term (current) use of aspirin: Secondary | ICD-10-CM | POA: Diagnosis not present

## 2020-12-16 DIAGNOSIS — I110 Hypertensive heart disease with heart failure: Secondary | ICD-10-CM | POA: Diagnosis present

## 2020-12-16 DIAGNOSIS — I10 Essential (primary) hypertension: Secondary | ICD-10-CM

## 2020-12-16 DIAGNOSIS — Z793 Long term (current) use of hormonal contraceptives: Secondary | ICD-10-CM | POA: Insufficient documentation

## 2020-12-16 NOTE — Patient Instructions (Signed)
EKG done today.  No Labs done today.   No medication changes were made. Please continue all current medications as prescribed.  Your physician recommends that you schedule a follow-up appointment in: 3 months with an echo prior to your to your exam.   If you have any questions or concerns before your next appointment please send [place] a message through [place] or call our office at [phone].    TO LEAVE A MESSAGE FOR THE NURSE SELECT OPTION 2, PLEASE LEAVE A MESSAGE INCLUDING: YOUR NAME DATE OF BIRTH CALL BACK NUMBER REASON FOR CALL**this is important as we prioritize the call backs  YOU WILL RECEIVE A CALL BACK THE SAME DAY AS LONG AS YOU CALL BEFORE 4:00 PM   Do the following things EVERYDAY: Weigh yourself in the morning before breakfast. Write it down and keep it in a log. Take your medicines as prescribed Eat low salt foods--Limit salt (sodium) to 2000 mg per day.  Stay as active as you can everyday Limit all fluids for the day to less than 2 liters   At the [facility], you and your health needs are our priority. As part of our continuing mission to provide you with exceptional heart care, we have created designated Provider Care Teams. These Care Teams include your primary Cardiologist (physician) and Advanced Practice Providers (APPs- Physician Assistants and Nurse Practitioners) who all work together to provide you with the care you need, when you need it.   You may see any of the following providers on your designated Care Team at your next follow up: Dr Glori Bickers Dr Haynes Kerns, NP Lyda Jester, [place] Audry Riles, PharmD   Please be sure to bring in all your medications bottles to every appointment.

## 2020-12-16 NOTE — Progress Notes (Addendum)
CARDIO-ONCOLOGY CLINIC CONSULT NOTE  Referring Physician: Dr. Lindi Adie Primary Care: [name], MD Primary Cardiologist: Dr. Gwenlyn Found  HPI:  Pamela [name] is 56 y.o. female with obesity, HTN, HL and right breast cancer  diagnosed referred by Dr. Lindi Adie  for enrollment into the Cardio-Oncology program.   Malignant neoplasm of lower-outer quadrant of right breast of female, estrogen receptor positive ([place])  05/03/2020 Initial Diagnosis    Screening mammogram showed a 1.0cm mass at the 6 o'clock position in the right breast. Diagnostic mammogram and US showed the 1.2cm mass at the 6 o'clock position in the right breast. Biopsy showed IDC, grade 3, HER-2 positive (3+), ER 15% weak, PR-, Ki67 80%.    05/11/2020 Cancer Staging    Staging form: Breast, AJCC 8th Edition - Clinical stage from 05/11/2020: Stage IA (cT1b, cN0, cM0, G3, ER+, PR-, HER2+) - Signed by Nicholas Lose, MD on 05/11/2020 Stage prefix: Initial diagnosis    05/19/2020 Genetic Testing    Negative hereditary cancer genetic testing: no pathogenic variants detected in [facility] Breast STAT Panel and Common Hereditary Cancers Panel.  The report dates are May 19, 2020 (STAT) and May 23, 2020 (Common Hereditary).    The Common Hereditary Cancers Panel offered by [facility] includes sequencing and/or deletion duplication testing of the following 47 genes: APC, ATM, AXIN2, BARD1, BMPR1A, BRCA1, BRCA2, BRIP1, CDH1, CDK4, CDKN2A (p14ARF), CDKN2A (p16INK4a), CHEK2, CTNNA1, DICER1, EPCAM (Deletion/duplication testing only), GREM1 (promoter region deletion/duplication testing only), GREM1, HOXB13, KIT, MEN1, MLH1, MSH2, MSH3, MSH6, MUTYH, NBN, NF1, NHTL1, PALB2, PDGFRA, PMS2, POLD1, POLE, PTEN, RAD50, RAD51C, RAD51D, SDHA, SDHB, SDHC, SDHD, SMAD4, SMARCA4. STK11, TP53, TSC1, TSC2, and VHL.  The following genes were evaluated for sequence changes only: SDHA and HOXB13 c.251G>A variant only.es only: SDHA and HOXB13 c.251G>A variant only.    06/02/2020  Surgery    Right lumpectomy ([facility]): invasive and in situ ductal carcinoma, 1.3cm, clear margins, 1 right axillary lymph node negative for carcinoma.    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 Denies known heart disease. Had COVID in January and still has some SOB.   Diagnosed with HER-2+ breast CA in 3/22. Treated with lumpectomy in 3/22. Then Taxol/Herceptin 12 rounds from 5/22-8/22. Now on Herceptin q 3 weeks. Also about to finish XRT. Tolerating well. Has had 3 echos.  EF stable but on most recent echo GLS noted to drop significantly.   Echo 05/20/20: EF 60-65% GLS -21.2% Echo 08/30/20: EF 60-65% GLS - 20.7% Echo 107/22: EF 60-65% GLS reported as -13.3%    Review of Systems: [y] = yes, [ ]  = no   General: Weight gain [ ] ; Weight loss [ ] ; Anorexia [ ] ; Fatigue [ y]; Fever [ ] ; Chills [ ] ; Weakness [ ]   Cardiac: Chest pain/pressure [ ] ; Resting SOB [ ] ; Exertional SOB [ y]; Orthopnea [ ] ; Pedal Edema [ ] ; Palpitations [ ] ; Syncope [ ] ; Presyncope [ ] ; Paroxysmal nocturnal dyspnea[ ]   Pulmonary: Cough [ ] ; Wheezing[ ] ; Hemoptysis[ ] ;  Sputum [ ] ; Snoring [ ]   GI: Vomiting[ ] ; Dysphagia[ ] ; Melena[ ] ; Hematochezia [ ] ; Heartburn[ ] ; Abdominal pain [ ] ; Constipation [ ] ; Diarrhea [ ] ; BRBPR [ ]   GU: Hematuria[ ] ; Dysuria [ ] ; Nocturia[ ]   Vascular: Pain in legs with walking [ ] ; Pain in feet with lying flat [ ] ; Non-healing sores [ ] ; Stroke [ ] ; TIA [ ] ; Slurred speech [ ] ;  Neuro: Headaches[ y]; Vertigo[ ] ; Seizures[ ] ; Paresthesias[ ] ;Blurred vision  [ ] ; Diplopia [ ] ; Vision changes [ ]   Ortho/Skin: Arthritis [ y]; Joint pain [ y]; Muscle pain [ y]; Joint swelling [ ] ; Back Pain [ ] ; Rash [ ]   Psych: Depression[ ] ; Anxiety[ ]   Heme: Bleeding problems [ ] ; Clotting disorders [ ] ; Anemia [ ]   Endocrine: Diabetes [ ] ; Thyroid dysfunction[ ]    Past Medical History:  Diagnosis Date   Asthma    DUB (dysfunctional uterine bleeding) 2007   Elevated cholesterol    Family history of breast cancer 05/12/2020   Family history of lung cancer 05/12/2020   H/O menorrhagia 05/01/2006   History of ovarian cyst 2007   Hypertension 03/31/2005   Increased BMI 07/11/2006   Migraine    N&V (nausea and vomiting)    Obesity 2007   Obstructive sleep apnea syndrome, moderate 10/27/2013   no cpap   Weight loss 07/11/2006    Current Outpatient Medications  Medication Sig Dispense Refill   amLODipine (NORVASC) 10 MG tablet TAKE 1 TABLET BY MOUTH DAILY. 90 tablet 2   aspirin EC 81 MG tablet Take 1 tablet (81 mg total) by mouth daily. Swallow whole. 30 tablet 11   Azilsartan-Chlorthalidone (EDARBYCLOR) 40-25 MG TABS Take 1 tablet by mouth daily. 90 tablet 2   hydrOXYzine (ATARAX/VISTARIL) 25 MG tablet Take 1 tablet (25 mg total) by mouth every 6 (six) hours as needed for anxiety. 36 tablet 0   levonorgestrel (MIRENA, 52 MG,) 20 MCG/DAY IUD 1 each by Intrauterine route once.     Multiple Vitamin (MULTIVITAMIN) LIQD Take 5 mLs by mouth daily.     rosuvastatin (CRESTOR) 40 MG tablet Take 1 tablet (40 mg total) by mouth daily. 90 tablet 3   VITAMIN D, CHOLECALCIFEROL, PO Take 1 tablet by mouth daily.     No current facility-administered medications for this encounter.   Facility-Administered Medications Ordered in Other Encounters  Medication Dose Route Frequency Provider Last Rate Last Admin   heparin lock flush 100 unit/mL  500 Units Intracatheter Once Nicholas Lose, MD       sodium chloride flush (NS) 0.9 % injection 10 mL  10 mL Intracatheter Once Nicholas Lose, MD        Allergies  Allergen Reactions   Pollen Extract Shortness Of Breath   Latex Rash   Codeine Hives   Shellfish Allergy Hives   Betadine [Povidone Iodine] Rash      Social History   Socioeconomic History   Marital status: Single    Spouse name: Not on file   Number of children: Not on file   Years of education: Not on file   Highest education level: Not on file  Occupational History   Not on file  Tobacco Use   Smoking status: Never   Smokeless tobacco: Never  Vaping Use   Vaping Use: Never used  Substance and Sexual Activity   Alcohol use: No   Drug use: Not Currently    Frequency: 2.0 times per week   Sexual activity:  Not Currently    Birth control/protection: I.U.D.    Comment: Mirena  Other Topics Concern   Not on file  Social History Narrative   Not on file   Social Determinants of Health   Financial Resource Strain: Not on file  Food Insecurity: Not on file  Transportation Needs: Not on file  Physical Activity: Not on file  Stress: Not on file  Social Connections: Not on file  Intimate Partner Violence: Not on file      Family History  Problem Relation Age of Onset   Diabetes Maternal Grandmother    Hypertension Mother    Hypertension Father    Heart attack Father    Bone cancer Maternal Grandfather        dx after 61   Breast cancer Maternal Aunt        dx 95s   Lung cancer Maternal Aunt        dx after 50    Vitals:   12/16/20 1110  BP: 124/78  Pulse: 69  SpO2: 100%  Weight: (!) 147.1 kg (324 lb 3.2 oz)    PHYSICAL EXAM: General:  Well appearing. No respiratory difficulty HEENT: normal Neck: supple. no JVD. + R port Carotids 2+ bilat; no bruits. No lymphadenopathy or thryomegaly appreciated. Cor: PMI nondisplaced. Regular rate & rhythm. No rubs, gallops or murmurs. Lungs: clear Abdomen: obese soft, nontender, nondistended. No hepatosplenomegaly. No bruits or masses. Good bowel sounds. Extremities: no cyanosis,  clubbing, rash, edema Neuro: alert & oriented x 3, cranial nerves grossly intact. moves all 4 extremities w/o difficulty. Affect pleasant.  ECG: NSR 77 LBBB Personally reviewed   ASSESSMENT & PLAN:  1. Right Breast Cancer - HER2 + - Treated with lumpectomy in 3/22. Then Taxol/Herceptin 12 rounds from 5/22-8/22. Now on Herceptin q 3 weeks. Also about to finish XRT.  - Echo 10/22 EF 60-65% GLS reported as -13.3%. I have reviewed this and reading is inaccurate due to poor endocardial tracking - Explained incidence of Herceptin cardiotoxicity and role of Cardio-oncology clinic at length. Echo images reviewed personally - everything looks fine. Reviewed signs and symptoms of HF to look for. Continue Herceptin. Follow-up with echo in 3 months.  2. HTN - Blood pressure well controlled. Continue current regimen.    Glori Bickers, MD  11:30 AM

## 2020-12-19 ENCOUNTER — Ambulatory Visit
Admission: RE | Admit: 2020-12-19 | Discharge: 2020-12-19 | Disposition: A | Discharge status: Home / Self Care | Payer: 59 | Source: Ambulatory Visit | Attending: Radiation Oncology | Admitting: Radiation Oncology

## 2020-12-19 ENCOUNTER — Other Ambulatory Visit: Payer: Self-pay

## 2020-12-19 ENCOUNTER — Encounter: Payer: Self-pay | Admitting: *Deleted

## 2020-12-19 ENCOUNTER — Ambulatory Visit: Payer: 59

## 2020-12-19 DIAGNOSIS — C50511 Malignant neoplasm of lower-outer quadrant of right female breast: Secondary | ICD-10-CM

## 2020-12-19 MED ORDER — RADIAPLEXRX EX GEL: Freq: Once | CUTANEOUS | Status: AC

## 2020-12-20 ENCOUNTER — Other Ambulatory Visit: Payer: Self-pay

## 2020-12-20 ENCOUNTER — Ambulatory Visit
Admission: RE | Admit: 2020-12-20 | Discharge: 2020-12-20 | Disposition: A | Discharge status: Home / Self Care | Payer: 59 | Source: Ambulatory Visit | Attending: Radiation Oncology | Admitting: Radiation Oncology

## 2020-12-20 ENCOUNTER — Encounter: Payer: Self-pay | Admitting: Radiation Oncology

## 2020-12-20 DIAGNOSIS — C50511 Malignant neoplasm of lower-outer quadrant of right female breast: Secondary | ICD-10-CM | POA: Diagnosis not present

## 2020-12-21 ENCOUNTER — Ambulatory Visit: Payer: 59 | Attending: Radiation Oncology

## 2020-12-21 DIAGNOSIS — Z483 Aftercare following surgery for neoplasm: Secondary | ICD-10-CM | POA: Insufficient documentation

## 2020-12-21 DIAGNOSIS — Z170 Estrogen receptor positive status [ER+]: Secondary | ICD-10-CM | POA: Insufficient documentation

## 2020-12-21 DIAGNOSIS — C50511 Malignant neoplasm of lower-outer quadrant of right female breast: Secondary | ICD-10-CM | POA: Diagnosis present

## 2020-12-21 DIAGNOSIS — R600 Localized edema: Secondary | ICD-10-CM | POA: Diagnosis present

## 2020-12-21 DIAGNOSIS — M79622 Pain in left upper arm: Secondary | ICD-10-CM | POA: Diagnosis present

## 2020-12-21 DIAGNOSIS — R293 Abnormal posture: Secondary | ICD-10-CM | POA: Insufficient documentation

## 2020-12-21 DIAGNOSIS — M79621 Pain in right upper arm: Secondary | ICD-10-CM | POA: Insufficient documentation

## 2020-12-21 NOTE — Therapy (Signed)
[facility] @ [place], [place], [address] Phone: [phone]   Fax:  [phone]  Physical Therapy Evaluation  Patient Details  Name: Pamela [name] MRN: [id number] Date of Birth: [birth year] Referring Provider (PT): Dr. Isidore Moos   Encounter Date: 12/21/2020   PT End of Session - 12/21/20 0917     Visit Number 1    Number of Visits 13    Date for PT Re-Evaluation 02/01/21    PT Start Time 0808    PT Stop Time 0904    PT Time Calculation (min) 56 min    Activity Tolerance Patient tolerated treatment well    Behavior During Therapy [facility] for tasks assessed/performed             Past Medical History:  Diagnosis Date   Asthma    DUB (dysfunctional uterine bleeding) 2007   Elevated cholesterol    Family history of breast cancer 05/12/2020   Family history of lung cancer 05/12/2020   H/O menorrhagia 05/01/2006   History of ovarian cyst 2007   Hypertension 03/31/2005   Increased BMI 07/11/2006   Migraine    N&V (nausea and vomiting)    Obesity 2007   Obstructive sleep apnea syndrome, moderate 10/27/2013   no cpap   Weight loss 07/11/2006    Past Surgical History:  Procedure Laterality Date   BREAST LUMPECTOMY WITH RADIOACTIVE SEED AND SENTINEL LYMPH NODE BIOPSY Right 06/02/2020   Procedure: RIGHT BREAST LUMPECTOMY WITH RADIOACTIVE SEED AND RIGHT SENTINEL LYMPH NODE BIOPSY;  Surgeon: [name], MD;  Location: [facility];  Service: General;  Laterality: Right;   BREAST REDUCTION SURGERY  05/1991   CESAREAN SECTION     HYSTEROSCOPY  05/01/2006   PORTACATH PLACEMENT Right 06/02/2020   Procedure: INSERTION PORT-A-CATH;  Surgeon: [name], MD;  Location: [facility];  Service: General;  Laterality: Right;    There were no vitals filed for this visit.    Subjective Assessment - 12/21/20 0912     Subjective Pt is having tingling in the medial 3 fingers that comes and  goes and will leave in a few minutes but happens multiple times in a day. sometimes it skips a day or 2. right hand swells everyday and has been swelling for months even before surgery. No real swelling in arm, but has alot of pain in both arms mainly upper arm. Arm pain bilaterally started about a month ago .   She feels a heaviness in her arms during the day, but feels the pain mainly when trying to sleep or rest. She has disturbed sleep because she has to change position all night.. Bilateral shoulder ROM sometimes feels limited.    Pertinent History Pt had a right lumpectomy with SLNB on 06/02/2020 ith 0/1 LN.  she had chemo with taxol from 5/6 - 10/21/2020, and radiation 11/23/2020-12/20/2020. Cancer was in situ and IDC Gr 3 Her 2+, ER+, PR- with ki 67 of 80%                [facility] PT Assessment - 12/21/20 0001       Assessment   Medical Diagnosis Right breast cancer    Referring Provider (PT) Dr. Isidore Moos    Onset Date/Surgical Date 06/02/20    Hand Dominance Right    Prior Therapy yes      Precautions   Precaution Comments lymphedema risk      Restrictions   Weight Bearing Restrictions  No      Balance Screen   Has the patient fallen in the past 6 months No    Has the patient had a decrease in activity level because of a fear of falling?  No    Is the patient reluctant to leave their home because of a fear of falling?  No      Home Environment   Living Environment Private residence    Living Arrangements Children    Available Help at Discharge Family    Type of [place]      Prior Function   Level of Independence Independent    Vocation Full time employment    Vocation Requirements Social worker    Leisure games, visit friends, church      Cognition   Overall Cognitive Status Within Functional Limits for tasks assessed      Observation/Other Assessments   Observations breast skin mildy darker from radiation. incisions healed without visible swelling       Posture/Postural Control   Posture/Postural Control Postural limitations    Postural Limitations Rounded Shoulders;Forward head      AROM   Right Shoulder Extension 58 Degrees    Right Shoulder Flexion 142 Degrees   mild pain shoulder   Right Shoulder ABduction 158 Degrees    Right Shoulder Internal Rotation 55 Degrees   pain in shoulder   Right Shoulder External Rotation 75 Degrees   pain in shoulder   Left Shoulder Extension 52 Degrees    Left Shoulder Flexion 132 Degrees    Left Shoulder ABduction 155 Degrees    Left Shoulder Internal Rotation 30 Degrees   pain ERP   Left Shoulder External Rotation 70 Degrees   ERP   Cervical Flexion WNL increases pain in arms    Cervical Extension Decreased 20%, prod left shoulder    Cervical - Right Side Bend decreased 10% tight left   left SB decreased 20 % tight on left.   Cervical - Right Rotation decreased 20% tight on left    Cervical - Left Rotation decreased 20%, tight left neck               LYMPHEDEMA/ONCOLOGY QUESTIONNAIRE - 12/21/20 0001       Type   Cancer Type IDC and DCIs      Surgeries   Lumpectomy Date 06/02/20    Sentinel Lymph Node Biopsy Date 06/02/20    Number Lymph Nodes Removed 1      Treatment   Active Chemotherapy Treatment No    Past Chemotherapy Treatment Yes    Date 10/21/20    Active Radiation Treatment No    Past Radiation Treatment Yes    Date --   12/20/2020   Current Hormone Treatment No    Past Hormone Therapy No      What other symptoms do you have   Are you Having Heaviness or Tightness Yes   hand/shoulders   Are you having Pain Yes    Are you having pitting edema No    Is it Hard or Difficult finding clothes that fit No    Do you have infections No    Is there Decreased scar mobility No      Right Upper Extremity Lymphedema   15 cm Proximal to Olecranon Process 46 cm    10 cm Proximal to Olecranon Process 43 cm    Olecranon Process 32.2 cm    10 cm Proximal to Ulnar Styloid Process  27.8 cm  Just Proximal to Ulnar Styloid Process 19.1 cm    Across Hand at [employer] 21.8 cm    At [place] of 2nd Digit 6.9 cm      Left Upper Extremity Lymphedema   15 cm Proximal to Olecranon Process 48.3 cm    10 cm Proximal to Olecranon Process 44.6 cm    Olecranon Process 31.4 cm    10 cm Proximal to Ulnar Styloid Process 27.1 cm    Just Proximal to Ulnar Styloid Process 19.1 cm    Across Hand at [employer] 21.6 cm    At [place] of 2nd Digit 7.15 cm                   Quick Dash - 12/21/20 0001     Open a tight or new jar Mild difficulty    Do heavy household chores (wash walls, wash floors) Moderate difficulty    Carry a shopping bag or briefcase Moderate difficulty    Wash your back Moderate difficulty    Use a knife to cut food Mild difficulty    Recreational activities in which you take some force or impact through your arm, shoulder, or hand (golf, hammering, tennis) Moderate difficulty    During the past week, to what extent has your arm, shoulder or hand problem interfered with your normal social activities with family, friends, neighbors, or groups? Quite a bit    During the past week, to what extent has your arm, shoulder or hand problem limited your work or other regular daily activities [place]    Arm, shoulder, or hand pain. Severe    Tingling (pins and needles) in your arm, shoulder, or hand Mild    Difficulty Sleeping Severe difficulty    DASH Score 50 %              Objective measurements completed on examination: See above findings.                     PT Long Term Goals - 12/21/20 1029       PT LONG TERM GOAL #1   Title Pt will be independent and compliant with HEP for proper posture, ROM and strengthening    Time 6    Period Weeks    Status New    Target Date 02/01/21      PT LONG TERM GOAL #2   Title Pt will have decreased bilateral arm pain by atleast 50%    Time 6    Period Weeks    Status New     Target Date 01/31/21      PT LONG TERM GOAL #3   Title Pt will be able to return demonstrate proper sitting/standing posture    Time 6    Period Weeks    Status New    Target Date 01/31/21      PT LONG TERM GOAL #4   Title Pt will have Shoulder ROM WNL and strength atleast 4+/5    Time 6    Period Weeks    Status New    Target Date 01/31/21      PT LONG TERM GOAL #5   Title Pt will have improved sleep by 75%    Time 6    Period Weeks    Status New    Target Date 01/31/21      Additional Long Term Goals   Additional Long Term Goals Yes      PT  LONG TERM GOAL #6   Title Quick dash will be no greater than 15%    Baseline 50    Time 6    Period Weeks    Status New    Target Date 02/01/21                    Plan - 12/21/20 3419     Clinical Impression Statement Pt is s/p Right lumpectomy with SLNB on 06/02/2020.  She is also post chemo with taxol ending on 10/21/2020, and radiation which ended 12/20/2020. She is presently on Herceptin maintenance.  She presents with complaints of bilateral upper arm pain present for about 3-4 weeks and with intermittent right medial 3 finger tingling which comes and goes and resoves quickly usually.  Cervical Flexion did increase bilateral upper arm pain, and cervical ROM in general made her feel slightly better.  Pain is usually much worse at night and she tosses and turns.  She uses 3-4 pillows and has noticed she feels better with fewer pillows.  She was advised to try sleeping in a more neutral position tonihght to see how it feels. Shoulder ROM is limited somewhat bilaterally and she feels weakness right greater than left. There does appear to be mild finger swelling especially at middle finger and mild hand edema that may require a glove or wrapping.    Personal Factors and Comorbidities Comorbidity 2    Comorbidities right breast CA with chemo/radiation    Examination-Activity Limitations Sleep;Lift;Reach Overhead     Stability/Clinical Decision Making Stable/Uncomplicated    Clinical Decision Making Low    Rehab Potential Good    PT Frequency 2x / week    PT Duration 6 weeks    PT Treatment/Interventions ADLs/Self Care Home Management;Aquatic Therapy;Therapeutic exercise;Therapeutic activities;Patient/family education;Orthotic Fit/Training;Manual techniques;Passive range of motion;Compression bandaging;Manual lymph drainage    PT Next Visit Plan postural education,Postural exs: cervical retraction and assess benefit for shoulder pain, scapular retraction,cervical ROM try manual traction for bilateral arm pain, progress to strength for posture/shoulders, keep an eye on hand swelling present long before surgery . may need wrapping or glove    Recommended Other Services consider addressing right hand swelling prn    Consulted and Agree with Plan of Care Patient             Patient will benefit from skilled therapeutic intervention in order to improve the following deficits and impairments:  Decreased knowledge of precautions, Decreased range of motion, Decreased strength, Increased edema, Postural dysfunction, Pain, Impaired UE functional use, Obesity  Visit Diagnosis: Malignant neoplasm of lower-outer quadrant of right breast of female, estrogen receptor positive (HCC)  Abnormal posture  Localized edema  Pain in left upper arm  Pain in right upper arm  Aftercare following surgery for neoplasm     Problem List Patient Active Problem List   Diagnosis Date Noted   Non-intractable vomiting 07/13/2020   Port-A-Cath in place 07/08/2020   Genetic testing 05/20/2020   Family history of breast cancer 05/12/2020   Family history of lung cancer 05/12/2020   Malignant neoplasm of lower-outer quadrant of right breast of female, estrogen receptor positive ([place]) 05/03/2020   Elevated troponin level not due myocardial infarction 09/18/2019   Mixed hyperlipidemia 09/18/2019   Hypertensive emergency  09/17/2019   Primary osteoarthritis of right hip 08/06/2018   Seasonal allergies 06/12/2018   Fibrocystic disease of breast 05/27/2018   Obstructive sleep apnea syndrome, moderate 10/27/2013   Shift work sleep disorder 10/27/2013  Psychic factors associated with diseases classified elsewhere 08/07/2013   High cholesterol 07/21/2013   Essential hypertension 07/21/2013   Snoring 07/21/2013   IUD contraception-inserted 10/01/11 10/01/2011   Class 3 severe obesity due to excess calories with serious comorbidity and body mass index (BMI) of 45.0 to 49.9 in adult [facility]) 09/03/2011    Claris Pong, PT 12/21/2020, 10:36 AM  [facility] @ [place], [place], [address] Phone: [phone]   Fax:  [phone]  Name: Pamela [name] MRN: [id number] Date of Birth: 06-Nov-1965

## 2020-12-22 ENCOUNTER — Encounter: Payer: Self-pay | Admitting: Internal Medicine

## 2020-12-22 ENCOUNTER — Other Ambulatory Visit: Payer: Self-pay

## 2020-12-22 ENCOUNTER — Ambulatory Visit (INDEPENDENT_AMBULATORY_CARE_PROVIDER_SITE_OTHER): Payer: 59 | Admitting: Internal Medicine

## 2020-12-22 VITALS — BP 122/88 | HR 96 | Temp 99.6°F | Ht 69.0 in | Wt 320.4 lb

## 2020-12-22 DIAGNOSIS — I10 Essential (primary) hypertension: Secondary | ICD-10-CM

## 2020-12-22 DIAGNOSIS — R051 Acute cough: Secondary | ICD-10-CM | POA: Diagnosis not present

## 2020-12-22 DIAGNOSIS — Z170 Estrogen receptor positive status [ER+]: Secondary | ICD-10-CM

## 2020-12-22 DIAGNOSIS — Z6841 Body Mass Index (BMI) 40.0 and over, adult: Secondary | ICD-10-CM

## 2020-12-22 DIAGNOSIS — C50511 Malignant neoplasm of lower-outer quadrant of right female breast: Secondary | ICD-10-CM | POA: Diagnosis not present

## 2020-12-22 LAB — POC INFLUENZA A&B (BINAX/QUICKVUE)
Influenza A, POC: NEGATIVE
Influenza B, POC: NEGATIVE

## 2020-12-22 LAB — POC COVID19 BINAXNOW: SARS Coronavirus 2 Ag: NEGATIVE

## 2020-12-22 MED ORDER — AMLODIPINE BESYLATE 10 MG PO TABS: 10.0000 mg | ORAL_TABLET | Freq: Every day | ORAL | 2 refills | Status: DC

## 2020-12-22 MED ORDER — EDARBYCLOR 40-25 MG PO TABS: 1.0000 | ORAL_TABLET | Freq: Every day | ORAL | 2 refills | Status: DC

## 2020-12-22 NOTE — Progress Notes (Signed)
I,[name],acting as a Education administrator for [name], MD.,have documented all relevant documentation on the behalf of [name], MD,as directed by  [name], MD while in the presence of [name], MD.  This visit occurred during the SARS-CoV-2 public health emergency.  Safety protocols were in place, including screening questions prior to the visit, additional usage of staff PPE, and extensive cleaning of exam room while observing appropriate contact time as indicated for disinfecting solutions.  Subjective:     Patient ID: Pamela [name] , female    DOB: 09-22-1965 , 55 y.o.   MRN: [id number]   Chief Complaint  Patient presents with   Hypertension    HPI  The patient is here today for a blood pressure f/u.  She reports compliance with meds. She denies headaches, chest pain and shortness of breath.   Hypertension This is a chronic problem. The current episode started more than 1 year ago. The problem has been gradually improving since onset. The problem is controlled. There are no associated agents to hypertension. Risk factors for coronary artery disease include obesity and sedentary lifestyle. Past treatments include diuretics and angiotensin blockers. There are no compliance problems.  There is no history of angina. There is no history of chronic renal disease.    Past Medical History:  Diagnosis Date   Asthma    DUB (dysfunctional uterine bleeding) 2007   Elevated cholesterol    Family history of breast cancer 05/12/2020   Family history of lung cancer 05/12/2020   H/O menorrhagia 05/01/2006   History of ovarian cyst 2007   Hypertension 03/31/2005   Increased BMI 07/11/2006   Migraine    N&V (nausea and vomiting)    Obesity 2007   Obstructive sleep apnea syndrome, moderate 10/27/2013   no cpap   Weight loss 07/11/2006     Family History  Problem Relation Age of Onset   Diabetes Maternal Grandmother    Hypertension Mother    Hypertension Father     Heart attack Father    Bone cancer Maternal Grandfather        dx after 50   Breast cancer Maternal Aunt        dx 32s   Lung cancer Maternal Aunt        dx after 50     Current Outpatient Medications:    aspirin EC 81 MG tablet, Take 1 tablet (81 mg total) by mouth daily. Swallow whole., Disp: 30 tablet, Rfl: 11   levonorgestrel (MIRENA, 52 MG,) 20 MCG/DAY IUD, 1 each by Intrauterine route once., Disp: , Rfl:    Multiple Vitamin (MULTIVITAMIN) LIQD, Take 5 mLs by mouth daily., Disp: , Rfl:    rosuvastatin (CRESTOR) 40 MG tablet, Take 1 tablet (40 mg total) by mouth daily., Disp: 90 tablet, Rfl: 3   VITAMIN D, CHOLECALCIFEROL, PO, Take 1 tablet by mouth daily., Disp: , Rfl:    amLODipine (NORVASC) 10 MG tablet, Take 1 tablet (10 mg total) by mouth daily., Disp: 90 tablet, Rfl: 2   Azilsartan-Chlorthalidone (EDARBYCLOR) 40-25 MG TABS, Take 1 tablet by mouth daily., Disp: 90 tablet, Rfl: 2   benzonatate (TESSALON PERLES) 100 MG capsule, Take 1 capsule (100 mg total) by mouth 3 (three) times daily as needed for cough., Disp: 30 capsule, Rfl: 0   guaiFENesin-dextromethorphan (ROBITUSSIN DM) 100-10 MG/5ML syrup, Take 5 mLs by mouth every 4 (four) hours as needed for cough., Disp: 118 mL, Rfl: 0  Current Facility-Administered Medications:  0.9 %  sodium chloride infusion, , Intravenous, PRN, Ghumman, Ramandeep, NP  Facility-Administered Medications Ordered in Other Visits:    heparin lock flush 100 unit/mL, 500 Units, Intracatheter, Once, Gudena, Vinay, MD   sodium chloride flush (NS) 0.9 % injection 10 mL, 10 mL, Intracatheter, Once, Nicholas Lose, MD   Allergies  Allergen Reactions   Pollen Extract Shortness Of Breath   Latex Rash   Codeine Hives   Shellfish Allergy Hives   Betadine [Povidone Iodine] Rash     Review of Systems  Constitutional: Negative.   HENT:  Positive for congestion and rhinorrhea.   Respiratory: Negative.    Cardiovascular: Negative.   Gastrointestinal:  Negative.   Neurological: Negative.   Psychiatric/Behavioral: Negative.      Today's Vitals   12/22/20 0943  BP: 122/88  Pulse: 96  Temp: 99.6 F (37.6 C)  SpO2: 99%  Weight: (!) 320 lb 6.4 oz (145.3 kg)  Height: 5\' 9"  (1.753 m)   Body mass index is 47.31 kg/m.  Wt Readings from Last 3 Encounters:  01/18/21 (!) 313 lb 3.2 oz (142.1 kg)  01/18/21 (!) 310 lb (140.6 kg)  01/13/21 (!) 316 lb 14.4 oz (143.7 kg)    BP Readings from Last 3 Encounters:  01/18/21 (!) 142/103  01/18/21 132/86  01/13/21 (!) 153/93    Objective:  Physical Exam Vitals and nursing note reviewed.  Constitutional:      Appearance: Normal appearance. She is obese.  HENT:     Head: Normocephalic and atraumatic.     Right Ear: Tympanic membrane, ear canal and external ear normal. There is no impacted cerumen.     Left Ear: Tympanic membrane, ear canal and external ear normal. There is no impacted cerumen.     Nose:     Comments: Masked     Mouth/Throat:     Comments: Masked  Eyes:     Extraocular Movements: Extraocular movements intact.  Cardiovascular:     Rate and Rhythm: Normal rate and regular rhythm.     Heart sounds: Normal heart sounds.  Pulmonary:     Effort: Pulmonary effort is normal.     Breath sounds: Normal breath sounds.  Musculoskeletal:     Cervical back: Normal range of motion.  Skin:    General: Skin is warm.  Neurological:     General: No focal deficit present.     Mental Status: She is alert.  Psychiatric:        Mood and Affect: Mood normal.        Behavior: Behavior normal.        Assessment And Plan:     1. Essential hypertension Comments: Chronic, fair control. No med changes today. She is encouraged to follow low sodium diet. She was given refills on meds as below.  - TSH; Future - amLODipine (NORVASC) 10 MG tablet; Take 1 tablet (10 mg total) by mouth daily.  Dispense: 90 tablet; Refill: 2 - Azilsartan-Chlorthalidone (EDARBYCLOR) 40-25 MG TABS; Take 1 tablet by  mouth daily.  Dispense: 90 tablet; Refill: 2  2. Acute cough Comments: Rapid COVID neg, flu a/b neg. Sx likely due to postnasal drip. Advised to start loratadine 10mg  daily. She will let me know if her sx persist.  - POC Influenza A&B(BINAX/QUICKVUE) - POC COVID-19  3. Malignant neoplasm of lower-outer quadrant of right breast of female, estrogen receptor positive ([place]) Initially diagnosed in March 2022. She underwent right lumpectomy on 06/02/20. Treatment plan includes:  Adjuvant chemotherapy with Taxol  Herceptin followed by Herceptin maintenance.  Stopped after 11 cycles of Taxol, adjuvant radiation therapy   9/21-10/17/2022 and adjuvant antiestrogen therapy.  4. Class 3 severe obesity due to excess calories with serious comorbidity and body mass index (BMI) of 45.0 to 49.9 in adult [facility]) Comments: BMI 47. She is encouraged to initially strive for BMI less than 40 to decrease cardiac risk.   Patient was given opportunity to ask questions. Patient verbalized understanding of the plan and was able to repeat key elements of the plan. All questions were answered to their satisfaction.   I, [name], MD, have reviewed all documentation for this visit. The documentation on 12/22/20 for the exam, diagnosis, procedures, and orders are all accurate and complete.   IF YOU HAVE BEEN REFERRED TO A SPECIALIST, IT MAY TAKE 1-2 WEEKS TO SCHEDULE/PROCESS THE REFERRAL. IF YOU HAVE NOT HEARD FROM US/SPECIALIST IN TWO WEEKS, PLEASE GIVE [place] A CALL AT [phone].   THE PATIENT IS ENCOURAGED TO PRACTICE SOCIAL DISTANCING DUE TO THE COVID-19 PANDEMIC.

## 2020-12-22 NOTE — Patient Instructions (Signed)
Cooking With Less Salt Cooking with less salt is one way to reduce the amount of sodium you get from food. Sodium is one of the elements that make up salt. It is found naturally in foods and is also added to certain foods. Depending on your condition and overall health, your health care provider or dietitian may recommend that you reduce your sodium intake. Most people should have less than 2,300 milligrams (mg) of sodium each day. If you have high blood pressure (hypertension), you may need to limit your sodium to 1,500 mg each day. Follow the tipsbelow to help reduce your sodium intake. What are tips for eating less sodium? Reading food labels  Check the food label before buying or using packaged ingredients. Always check the label for the serving size and sodium content. Look for products with no more than 140 mg of sodium in one serving. Check the % Daily Value column to see what percent of the daily recommended amount of sodium is provided in one serving of the product. Foods with 5% or less in this column are considered low in sodium. Foods with 20% or higher are considered high in sodium. Do not choose foods with salt as one of the first three ingredients on the ingredients list. If salt is one of the first three ingredients, it usually means the item is high in sodium.  Shopping Buy sodium-free or low-sodium products. Look for the following words on food labels: Low-sodium. Sodium-free. Reduced-sodium. No salt added. Unsalted. Always check the sodium content even if foods are labeled as low-sodium or no salt added. Buy fresh foods. Cooking Use herbs, seasonings without salt, and spices as substitutes for salt. Use sodium-free baking soda when baking. Grill, braise, or roast foods to add flavor with less salt. Avoid adding salt to pasta, rice, or hot cereals. Drain and rinse canned vegetables, beans, and meat before use. Avoid adding salt when cooking sweets and desserts. Cook with  low-sodium ingredients. What foods are high in sodium? Vegetables Regular canned vegetables (not low-sodium or reduced-sodium). Sauerkraut, pickled vegetables, and relishes. Olives. French fries. Onion rings. Regular canned tomato sauce and paste. Regular tomato and vegetable juice. Frozenvegetables in sauces. Grains Instant hot cereals. Bread stuffing, pancake, and biscuit mixes. Croutons. Seasoned rice or pasta mixes. Noodle soup cups. Boxed or frozen macaroni and cheese. Regular salted crackers. Self-rising flour. Rolls. Bagels. Flourtortillas and wraps. Meats and other proteins Meat or fish that is salted, canned, smoked, cured, spiced, or pickled. This includes bacon, ham, sausages, hot dogs, corned beef, chipped beef, meat loaves, salt pork, jerky, pickled herring, anchovies, regular canned tuna, andsardines. Salted nuts. Dairy Processed cheese and cheese spreads. Cheese curds. Blue cheese. Feta cheese.String cheese. Regular cottage cheese. Buttermilk. Canned milk. The items listed above may not be a complete list of foods high in sodium. Actual amounts of sodium may be different depending on processing. Contact a dietitian for more information. What foods are low in sodium? Fruits Fresh, frozen, or canned fruit with no sauce added. Fruit juice. Vegetables Fresh or frozen vegetables with no sauce added. "No salt added" canned vegetables. "No salt added" tomato sauce and paste. Low-sodium orreduced-sodium tomato and vegetable juice. Grains Noodles, pasta, quinoa, rice. Shredded or puffed wheat or puffed rice. Regular or quick oats (not instant). Low-sodium crackers. Low-sodium bread. Whole-grainbread and whole-grain pasta. Unsalted popcorn. Meats and other proteins Fresh or frozen whole meats, poultry (not injected with sodium), and fish with no sauce added. Unsalted nuts. Dried peas, beans, and   lentils without added salt. Unsalted canned beans. Eggs. Unsalted nut butters. Low-sodium canned  tunaor chicken. Dairy Milk. Soy milk. Yogurt. Low-sodium cheeses, such as Swiss, Monterey Jack, mozzarella, and ricotta. Sherbet or ice cream (keep to  cup per serving).Cream cheese. Fats and oils Unsalted butter or margarine. Other foods Homemade pudding. Sodium-free baking soda and baking powder. Herbs and spices.Low-sodium seasoning mixes. Beverages Coffee and tea. Carbonated beverages. The items listed above may not be a complete list of foods low in sodium. Actual amounts of sodium may be different depending on processing. Contact a dietitian for more information. What are some salt alternatives when cooking? The following are herbs, seasonings, and spices that can be used instead of salt to flavor your food. Herbs should be fresh or dried. Do not choose packaged mixes. Next to the name of the herb, spice, or seasoning aresome examples of foods you can pair it with. Herbs Bay leaves - Soups, meat and vegetable dishes, and spaghetti sauce. Basil - Italian dishes, soups, pasta, and fish dishes. Cilantro - Meat, poultry, and vegetable dishes. Chili powder - Marinades and Mexican dishes. Chives - Salad dressings and potato dishes. Cumin - Mexican dishes, couscous, and meat dishes. Dill - Fish dishes, sauces, and salads. Fennel - Meat and vegetable dishes, breads, and cookies. Garlic (do not use garlic salt) - Italian dishes, meat dishes, salad dressings, and sauces. Marjoram - Soups, potato dishes, and meat dishes. Oregano - Pizza and spaghetti sauce. Parsley - Salads, soups, pasta, and meat dishes. Rosemary - Italian dishes, salad dressings, soups, and red meats. Saffron - Fish dishes, pasta, and some poultry dishes. Sage - Stuffings and sauces. Tarragon - Fish and poultry dishes. Thyme - Stuffing, meat, and fish dishes. Seasonings Lemon juice - Fish dishes, poultry dishes, vegetables, and salads. Vinegar - Salad dressings, vegetables, and fish dishes. Spices Cinnamon - Sweet  dishes, such as cakes, cookies, and puddings. Cloves - Gingerbread, puddings, and marinades for meats. Curry - Vegetable dishes, fish and poultry dishes, and stir-fry dishes. Ginger - Vegetable dishes, fish dishes, and stir-fry dishes. Nutmeg - Pasta, vegetables, poultry, fish dishes, and custard. Summary Cooking with less salt is one way to reduce the amount of sodium that you get from food. Buy sodium-free or low-sodium products. Check the food label before using or buying packaged ingredients. Use herbs, seasonings without salt, and spices as substitutes for salt in foods. This information is not intended to replace advice given to you by your health care provider. Make sure you discuss any questions you have with your healthcare provider. Document Revised: 02/11/2019 Document Reviewed: 02/11/2019 Elsevier Patient Education  2022 Elsevier Inc.  

## 2020-12-23 ENCOUNTER — Inpatient Hospital Stay: Payer: 59

## 2020-12-23 ENCOUNTER — Other Ambulatory Visit: Payer: Self-pay | Admitting: *Deleted

## 2020-12-23 ENCOUNTER — Inpatient Hospital Stay: Payer: 59 | Attending: Hematology and Oncology

## 2020-12-23 VITALS — BP 132/83 | HR 67 | Temp 98.1°F | Resp 18 | Ht 69.0 in

## 2020-12-23 DIAGNOSIS — Z5112 Encounter for antineoplastic immunotherapy: Secondary | ICD-10-CM | POA: Insufficient documentation

## 2020-12-23 DIAGNOSIS — I10 Essential (primary) hypertension: Secondary | ICD-10-CM

## 2020-12-23 DIAGNOSIS — Z95828 Presence of other vascular implants and grafts: Secondary | ICD-10-CM

## 2020-12-23 DIAGNOSIS — Z79899 Other long term (current) drug therapy: Secondary | ICD-10-CM | POA: Insufficient documentation

## 2020-12-23 DIAGNOSIS — C50511 Malignant neoplasm of lower-outer quadrant of right female breast: Secondary | ICD-10-CM

## 2020-12-23 DIAGNOSIS — Z170 Estrogen receptor positive status [ER+]: Secondary | ICD-10-CM | POA: Insufficient documentation

## 2020-12-23 LAB — CBC WITH DIFFERENTIAL (CANCER CENTER ONLY)
Abs Immature Granulocytes: 0.02 10*3/uL (ref 0.00–0.07)
Basophils Absolute: 0 10*3/uL (ref 0.0–0.1)
Basophils Relative: 0 %
Eosinophils Absolute: 0.2 10*3/uL (ref 0.0–0.5)
Eosinophils Relative: 3 %
HCT: 35.8 % — ABNORMAL LOW (ref 36.0–46.0)
Hemoglobin: 11.2 g/dL — ABNORMAL LOW (ref 12.0–15.0)
Immature Granulocytes: 0 %
Lymphocytes Relative: 12 %
Lymphs Abs: 0.8 10*3/uL (ref 0.7–4.0)
MCH: 26.4 pg (ref 26.0–34.0)
MCHC: 31.3 g/dL (ref 30.0–36.0)
MCV: 84.4 fL (ref 80.0–100.0)
Monocytes Absolute: 1 10*3/uL (ref 0.1–1.0)
Monocytes Relative: 14 %
Neutro Abs: 4.9 10*3/uL (ref 1.7–7.7)
Neutrophils Relative %: 71 %
Platelet Count: 201 10*3/uL (ref 150–400)
RBC: 4.24 MIL/uL (ref 3.87–5.11)
RDW: 14.3 % (ref 11.5–15.5)
WBC Count: 6.9 10*3/uL (ref 4.0–10.5)
nRBC: 0 % (ref 0.0–0.2)

## 2020-12-23 LAB — CMP (CANCER CENTER ONLY)
ALT: 18 U/L (ref 0–44)
AST: 23 U/L (ref 15–41)
Albumin: 4 g/dL (ref 3.5–5.0)
Alkaline Phosphatase: 63 U/L (ref 38–126)
Anion gap: 8 (ref 5–15)
BUN: 10 mg/dL (ref 6–20)
CO2: 32 mmol/L (ref 22–32)
Calcium: 9.3 mg/dL (ref 8.9–10.3)
Chloride: 99 mmol/L (ref 98–111)
Creatinine: 0.83 mg/dL (ref 0.44–1.00)
GFR, Estimated: >60 mL/min (ref >60)
Glucose, Bld: 116 mg/dL — ABNORMAL HIGH (ref 70–99)
Potassium: 3.2 mmol/L — ABNORMAL LOW (ref 3.5–5.1)
Sodium: 139 mmol/L (ref 135–145)
Total Bilirubin: 0.9 mg/dL (ref 0.3–1.2)
Total Protein: 7.4 g/dL (ref 6.5–8.1)

## 2020-12-23 LAB — TSH: TSH: 1.205 u[IU]/mL (ref 0.308–3.960)

## 2020-12-23 MED ORDER — DIPHENHYDRAMINE HCL 50 MG/ML IJ SOLN
50.0000 mg | Freq: Once | INTRAMUSCULAR | Status: AC
  Administered 2020-12-23: 50 mg via INTRAVENOUS
  Filled 2020-12-23: qty 1

## 2020-12-23 MED ORDER — HEPARIN SOD (PORK) LOCK FLUSH 100 UNIT/ML IV SOLN
500.0000 [IU] | Freq: Once | INTRAVENOUS | Status: AC | PRN
  Administered 2020-12-23: 500 [IU]

## 2020-12-23 MED ORDER — TRASTUZUMAB-ANNS CHEMO 150 MG IV SOLR
900.0000 mg | Freq: Once | INTRAVENOUS | Status: AC
  Administered 2020-12-23: 900 mg via INTRAVENOUS
  Filled 2020-12-23: qty 42.86

## 2020-12-23 MED ORDER — SODIUM CHLORIDE 0.9 % IV SOLN: Freq: Once | INTRAVENOUS | Status: AC

## 2020-12-23 MED ORDER — SODIUM CHLORIDE 0.9% FLUSH
10.0000 mL | INTRAVENOUS | Status: DC | PRN
  Administered 2020-12-23: 10 mL

## 2020-12-23 MED ORDER — ACETAMINOPHEN 325 MG PO TABS
650.0000 mg | ORAL_TABLET | Freq: Once | ORAL | Status: AC
  Administered 2020-12-23: 650 mg via ORAL
  Filled 2020-12-23: qty 2

## 2020-12-23 MED ORDER — SODIUM CHLORIDE 0.9% FLUSH
10.0000 mL | Freq: Once | INTRAVENOUS | Status: AC
Start: 2020-12-23 — End: 2020-12-23
  Administered 2020-12-23: 10 mL

## 2020-12-23 MED ORDER — CYANOCOBALAMIN 1000 MCG/ML IJ SOLN
1000.0000 ug | Freq: Once | INTRAMUSCULAR | Status: AC
Start: 2020-12-23 — End: 2020-12-23
  Administered 2020-12-23: 1000 ug via INTRAMUSCULAR
  Filled 2020-12-23: qty 1

## 2020-12-23 NOTE — Addendum Note (Signed)
Addended by: Neysa Hotter on: 12/23/2020 11:23 AM   Modules accepted: Orders

## 2020-12-23 NOTE — Patient Instructions (Signed)
[facility]  Discharge Instructions: Thank you for choosing [facility] to provide your oncology and hematology care.   If you have a lab appointment with the [facility], please go directly to the [facility] and check in at the registration area.   Wear comfortable clothing and clothing appropriate for easy access to any Portacath or PICC line.   We strive to give you quality time with your provider. You may need to reschedule your appointment if you arrive late (15 or more minutes).  Arriving late affects you and other patients whose appointments are after yours.  Also, if you miss three or more appointments without notifying the office, you may be dismissed from the clinic at the provider's discretion.      For prescription refill requests, have your pharmacy contact our office and allow 72 hours for refills to be completed.    Today you received the following chemotherapy and/or immunotherapy agents: Kanjiniti   To help prevent nausea and vomiting after your treatment, we encourage you to take your nausea medication as directed.  BELOW ARE SYMPTOMS THAT SHOULD BE REPORTED IMMEDIATELY: *FEVER GREATER THAN 100.4 F (38 C) OR HIGHER *CHILLS OR SWEATING *NAUSEA AND VOMITING THAT IS NOT CONTROLLED WITH YOUR NAUSEA MEDICATION *UNUSUAL SHORTNESS OF BREATH *UNUSUAL BRUISING OR BLEEDING *URINARY PROBLEMS (pain or burning when urinating, or frequent urination) *BOWEL PROBLEMS (unusual diarrhea, constipation, pain near the anus) TENDERNESS IN MOUTH AND THROAT WITH OR WITHOUT PRESENCE OF ULCERS (sore throat, sores in mouth, or a toothache) UNUSUAL RASH, SWELLING OR PAIN  UNUSUAL VAGINAL DISCHARGE OR ITCHING   Items with * indicate a potential emergency and should be followed up as soon as possible or go to the Emergency Department if any problems should occur.  Please show the CHEMOTHERAPY ALERT CARD or IMMUNOTHERAPY ALERT CARD at check-in to the  Emergency Department and triage nurse.  Should you have questions after your visit or need to cancel or reschedule your appointment, please contact [facility]  Dept: [phone]  and follow the prompts.  Office hours are 8:00 a.m. to 4:30 p.m. Monday - Friday. Please note that voicemails left after 4:00 p.m. may not be returned until the following business day.  We are closed weekends and major holidays. You have access to a nurse at all times for urgent questions. Please call the main number to the clinic Dept: [phone] and follow the prompts.   For any non-urgent questions, you may also contact your provider using MyChart. We now offer e-Visits for anyone 52 and older to request care online for non-urgent symptoms. For details visit [email or web address].   Also download the MyChart app! Go to the app store, search "MyChart", open the app, select , and log in with your MyChart username and password.  Due to Covid, a mask is required upon entering the hospital/clinic. If you do not have a mask, one will be given to you upon arrival. For doctor visits, patients may have 1 support person aged 35 or older with them. For treatment visits, patients cannot have anyone with them due to current Covid guidelines and our immunocompromised population.

## 2020-12-27 ENCOUNTER — Ambulatory Visit: Payer: 59

## 2020-12-29 ENCOUNTER — Encounter: Payer: 59 | Admitting: Rehabilitation

## 2021-01-03 ENCOUNTER — Encounter: Payer: Self-pay | Admitting: Rehabilitation

## 2021-01-03 ENCOUNTER — Ambulatory Visit: Payer: 59 | Attending: Radiation Oncology | Admitting: Rehabilitation

## 2021-01-03 ENCOUNTER — Other Ambulatory Visit: Payer: Self-pay

## 2021-01-03 DIAGNOSIS — R293 Abnormal posture: Secondary | ICD-10-CM | POA: Insufficient documentation

## 2021-01-03 DIAGNOSIS — R600 Localized edema: Secondary | ICD-10-CM | POA: Insufficient documentation

## 2021-01-03 DIAGNOSIS — M79622 Pain in left upper arm: Secondary | ICD-10-CM | POA: Diagnosis present

## 2021-01-03 DIAGNOSIS — C50511 Malignant neoplasm of lower-outer quadrant of right female breast: Secondary | ICD-10-CM | POA: Insufficient documentation

## 2021-01-03 DIAGNOSIS — M79621 Pain in right upper arm: Secondary | ICD-10-CM | POA: Diagnosis present

## 2021-01-03 DIAGNOSIS — Z170 Estrogen receptor positive status [ER+]: Secondary | ICD-10-CM | POA: Diagnosis present

## 2021-01-03 DIAGNOSIS — Z483 Aftercare following surgery for neoplasm: Secondary | ICD-10-CM | POA: Diagnosis present

## 2021-01-03 NOTE — Patient Instructions (Signed)
Access Code: [email or web address] URL: [email or web address]: 11/01/2022Prepared by: Marcene Brawn TevisExercises  Seated Cervical Retraction - 3 x daily - 7 x weekly - 1 sets - 10 reps - 3-5 second hold  Seated Upper Trapezius Stretch - 3 x daily - 7 x weekly - 3 reps - 30 seconds hold  Seated Shoulder External Rotation - 3 x daily - 7 x weekly - 1 sets - 10 reps - 3 second hold  Standing Shoulder Scaption Wall Walk - 1 x daily - 7 x weekly - 1 sets - 5 reps - 5 second hold  Standing Shoulder Flexion Wall Walk - 1 x daily - 7 x weekly - 1 sets - 5 reps - 5 second hold

## 2021-01-03 NOTE — Therapy (Addendum)
[facility] @ [address], [place], [address] Phone: [phone]   Fax:  [phone]  Physical Therapy Treatment  Patient Details  Name: Pamela [name] MRN: [id number] Date of Birth: [birth year] Referring Provider (PT): Dr. Isidore Moos   Encounter Date: 01/03/2021   PT End of Session - 01/03/21 1341     Visit Number 2    Number of Visits 13    Date for PT Re-Evaluation 02/01/21    PT Start Time 4801    PT Stop Time 1340    PT Time Calculation (min) 38 min    Activity Tolerance Patient tolerated treatment well    Behavior During Therapy [facility] for tasks assessed/performed             Past Medical History:  Diagnosis Date   Asthma    DUB (dysfunctional uterine bleeding) 2007   Elevated cholesterol    Family history of breast cancer 05/12/2020   Family history of lung cancer 05/12/2020   H/O menorrhagia 05/01/2006   History of ovarian cyst 2007   Hypertension 03/31/2005   Increased BMI 07/11/2006   Migraine    N&V (nausea and vomiting)    Obesity 2007   Obstructive sleep apnea syndrome, moderate 10/27/2013   no cpap   Weight loss 07/11/2006    Past Surgical History:  Procedure Laterality Date   BREAST LUMPECTOMY WITH RADIOACTIVE SEED AND SENTINEL LYMPH NODE BIOPSY Right 06/02/2020   Procedure: RIGHT BREAST LUMPECTOMY WITH RADIOACTIVE SEED AND RIGHT SENTINEL LYMPH NODE BIOPSY;  Surgeon: [name], MD;  Location: [facility];  Service: General;  Laterality: Right;   BREAST REDUCTION SURGERY  05/1991   CESAREAN SECTION     HYSTEROSCOPY  05/01/2006   PORTACATH PLACEMENT Right 06/02/2020   Procedure: INSERTION PORT-A-CATH;  Surgeon: [name], MD;  Location: [facility];  Service: General;  Laterality: Right;    There were no vitals filed for this visit.   Subjective Assessment - 01/03/21 1308     Subjective The pain is better because I went down to 1 pillow.  The Right arm is  still the problem with shooting pain but both shoulders are sore.    Pertinent History Pt had a right lumpectomy with SLNB on 06/02/2020 ith 0/1 LN.  she had chemo with taxol from 5/6 - 10/21/2020, and radiation 11/23/2020-12/20/2020. Cancer was in situ and IDC Gr 3 Her 2+, ER+, PR- with ki 67 of 80%    Currently in Pain? Yes    Pain Score 5     Pain Location Arm    Pain Orientation Left    Pain Descriptors / Indicators Aching    Pain Type Chronic pain    Pain Onset More than a month ago    Pain Frequency Constant                               [facility] Adult PT Treatment/Exercise - 01/03/21 0001       Exercises   Exercises Neck;Shoulder      Neck Exercises: Supine   Neck Retraction 10 reps    Neck Retraction Limitations with cueing to decrease force of contraction    Other Supine Exercise discussion on postural changes at the desk      Shoulder Exercises: Supine   Protraction Both;10 reps    Protraction Limitations with moderate vcs for initial completion    Flexion  Both;AAROM;10 reps    Flexion Limitations with the dowel      Shoulder Exercises: Seated   Other Seated Exercises seated ER bil x 5, bil X/Y/W x 5, review of chin tuck x 5      Shoulder Exercises: Pulleys   Flexion 2 minutes    Flexion Limitations with initial instruction with pull bilaterally Rt>Lt    ABduction 2 minutes    ABduction Limitations with initial instruction      Shoulder Exercises: Therapy Ball   Flexion Both;5 reps                          PT Long Term Goals - 12/21/20 1029       PT LONG TERM GOAL #1   Title Pt will be independent and compliant with HEP for proper posture, ROM and strengthening    Time 6    Period Weeks    Status New    Target Date 02/01/21      PT LONG TERM GOAL #2   Title Pt will have decreased bilateral arm pain by atleast 50%    Time 6    Period Weeks    Status New    Target Date 01/31/21      PT LONG TERM GOAL #3   Title Pt  will be able to return demonstrate proper sitting/standing posture    Time 6    Period Weeks    Status New    Target Date 01/31/21      PT LONG TERM GOAL #4   Title Pt will have Shoulder ROM WNL and strength atleast 4+/5    Time 6    Period Weeks    Status New    Target Date 01/31/21      PT LONG TERM GOAL #5   Title Pt will have improved sleep by 75%    Time 6    Period Weeks    Status New    Target Date 01/31/21      Additional Long Term Goals   Additional Long Term Goals Yes      PT LONG TERM GOAL #6   Title Quick dash will be no greater than 15%    Baseline 50    Time 6    Period Weeks    Status New    Target Date 02/01/21                   Plan - 01/03/21 1341     Clinical Impression Statement Pt has been working on improving posture at night with only 1 pillow and during work which has started to help.  Started postural and bil shoulder AROM/AAROM today which was tolerated well.  Started HEP with email and picture copies.  HEP with postural focus to start.  Pt returns to home visits today for work.    PT Frequency 2x / week    PT Duration 6 weeks    PT Treatment/Interventions ADLs/Self Care Home Management;Aquatic Therapy;Therapeutic exercise;Therapeutic activities;Patient/family education;Orthotic Fit/Training;Manual techniques;Passive range of motion;Compression bandaging;Manual lymph drainage    PT Next Visit Plan cont strength for posture/shoulders,    (keep an eye on hand swelling present long before surgery . may need wrapping or glove)    PT Home Exercise Plan Access Code: [email or web address]    Consulted and Agree with Plan of Care Patient             Patient will benefit from skilled therapeutic  intervention in order to improve the following deficits and impairments:     Visit Diagnosis: Abnormal posture  Localized edema  Pain in left upper arm  Pain in right upper arm  Aftercare following surgery for neoplasm  Malignant neoplasm of  lower-outer quadrant of right breast of female, estrogen receptor positive ([place])     Problem List Patient Active Problem List   Diagnosis Date Noted   Non-intractable vomiting 07/13/2020   Port-A-Cath in place 07/08/2020   Genetic testing 05/20/2020   Family history of breast cancer 05/12/2020   Family history of lung cancer 05/12/2020   Malignant neoplasm of lower-outer quadrant of right breast of female, estrogen receptor positive ([place]) 05/03/2020   Elevated troponin level not due myocardial infarction 09/18/2019   Mixed hyperlipidemia 09/18/2019   Hypertensive emergency 09/17/2019   Primary osteoarthritis of right hip 08/06/2018   Seasonal allergies 06/12/2018   Fibrocystic disease of breast 05/27/2018   Obstructive sleep apnea syndrome, moderate 10/27/2013   Shift work sleep disorder 10/27/2013   Psychic factors associated with diseases classified elsewhere 08/07/2013   High cholesterol 07/21/2013   Essential hypertension 07/21/2013   Snoring 07/21/2013   IUD contraception-inserted 10/01/11 10/01/2011   Class 3 severe obesity due to excess calories with serious comorbidity and body mass index (BMI) of 45.0 to 49.9 in adult [facility]) 09/03/2011   PHYSICAL THERAPY DISCHARGE SUMMARY  Visits from Start of Care: 2  Current functional level related to goals / functional outcomes: Goals not achieved.  Pt did not return   Remaining deficits: Unknown.  Did not return   Education / Equipment: NA   Patient agrees to discharge. Patient goals were not met. Patient is being discharged due to not returning since the last visit. Numerous No- Shows Stark Bray, PT 01/03/2021, 1:45 PM  [facility] @ [address] [place], [place], [address] Phone: [phone]   Fax:  [phone]  Name: AZALYA GALYON MRN: [id number] Date of Birth: [birth year]

## 2021-01-06 ENCOUNTER — Encounter: Payer: 59 | Admitting: Rehabilitation

## 2021-01-10 ENCOUNTER — Encounter: Payer: 59 | Admitting: Rehabilitation

## 2021-01-11 NOTE — Progress Notes (Signed)
Patient Care Team: [name], MD as PCP - General (Internal Medicine) Mcarthur Rossetti, MD as Consulting Physician (Orthopedic Surgery) Mauro Kaufmann, RN as Oncology Nurse Navigator Rockwell Germany, RN as Oncology Nurse Navigator [name], MD as Consulting Physician (General Surgery) Nicholas Lose, MD as Consulting Physician (Hematology and Oncology) Eppie Gibson, MD as Attending Physician (Radiation Oncology)  DIAGNOSIS:    ICD-10-CM   1. Malignant neoplasm of lower-outer quadrant of right breast of female, estrogen receptor positive ([place])  C50.511    Z17.0       SUMMARY OF ONCOLOGIC HISTORY: Oncology History  Malignant neoplasm of lower-outer quadrant of right breast of female, estrogen receptor positive ([place])  05/03/2020 Initial Diagnosis   Screening mammogram showed a 1.0cm mass at the 6 o'clock position in the right breast. Diagnostic mammogram and US showed the 1.2cm mass at the 6 o'clock position in the right breast. Biopsy showed IDC, grade 3, HER-2 positive (3+), ER 15% weak, PR-, Ki67 80%.   05/11/2020 Cancer Staging   Staging form: Breast, AJCC 8th Edition - Clinical stage from 05/11/2020: Stage IA (cT1b, cN0, cM0, G3, ER+, PR-, HER2+) - Signed by Nicholas Lose, MD on 05/11/2020 Stage prefix: Initial diagnosis    05/19/2020 Genetic Testing   Negative hereditary cancer genetic testing: no pathogenic variants detected in [facility] Breast STAT Panel and Common Hereditary Cancers Panel.  The report dates are May 19, 2020 (STAT) and May 23, 2020 (Common Hereditary).   The Common Hereditary Cancers Panel offered by [facility] includes sequencing and/or deletion duplication testing of the following 47 genes: APC, ATM, AXIN2, BARD1, BMPR1A, BRCA1, BRCA2, BRIP1, CDH1, CDK4, CDKN2A (p14ARF), CDKN2A (p16INK4a), CHEK2, CTNNA1, DICER1, EPCAM (Deletion/duplication testing only), GREM1 (promoter region deletion/duplication testing only), GREM1, HOXB13, KIT, MEN1, MLH1,  MSH2, MSH3, MSH6, MUTYH, NBN, NF1, NHTL1, PALB2, PDGFRA, PMS2, POLD1, POLE, PTEN, RAD50, RAD51C, RAD51D, SDHA, SDHB, SDHC, SDHD, SMAD4, SMARCA4. STK11, TP53, TSC1, TSC2, and VHL.  The following genes were evaluated for sequence changes only: SDHA and HOXB13 c.251G>A variant only.es only: SDHA and HOXB13 c.251G>A variant only.   06/02/2020 Surgery   Right lumpectomy (Pamela [name]): invasive and in situ ductal carcinoma, 1.3cm, clear margins, 1 right axillary lymph node negative for carcinoma.   06/02/2020 Cancer Staging   Staging form: Breast, AJCC 8th Edition - Pathologic stage from 06/02/2020: Stage IA (pT1c, pN0, cM0, G3, ER+, PR-, HER2+) - Signed by Gardenia Phlegm, NP on 06/15/2020 Stage prefix: Initial diagnosis Histologic grading system: 3 grade system    07/08/2020 - 10/21/2020 Chemotherapy   Taxol Herceptin followed by Herceptin maintenance   11/11/2020 -  Chemotherapy   Herceptin maintenance         CHIEF COMPLIANT: Herceptin maintenance  INTERVAL HISTORY: Pamela [name] is a 55 y.o. with above-mentioned history of right breast cancer treated with lumpectomy, and is currently on  Herceptin maintenance. She reports to the clinic today for treatment.   ALLERGIES:  is allergic to pollen extract, latex, codeine, shellfish allergy, and betadine [povidone iodine].  MEDICATIONS:  Current Outpatient Medications  Medication Sig Dispense Refill   amLODipine (NORVASC) 10 MG tablet Take 1 tablet (10 mg total) by mouth daily. 90 tablet 2   aspirin EC 81 MG tablet Take 1 tablet (81 mg total) by mouth daily. Swallow whole. 30 tablet 11   Azilsartan-Chlorthalidone (EDARBYCLOR) 40-25 MG TABS Take 1 tablet by mouth daily. 90 tablet 2   hydrOXYzine (ATARAX/VISTARIL) 25 MG tablet Take 1 tablet (25 mg total) by mouth  every 6 (six) hours as needed for anxiety. 36 tablet 0   levonorgestrel (MIRENA, 52 MG,) 20 MCG/DAY IUD 1 each by Intrauterine route once.     Multiple Vitamin (MULTIVITAMIN)  LIQD Take 5 mLs by mouth daily.     rosuvastatin (CRESTOR) 40 MG tablet Take 1 tablet (40 mg total) by mouth daily. 90 tablet 3   VITAMIN D, CHOLECALCIFEROL, PO Take 1 tablet by mouth daily.     No current facility-administered medications for this visit.   Facility-Administered Medications Ordered in Other Visits  Medication Dose Route Frequency Provider Last Rate Last Admin   heparin lock flush 100 unit/mL  500 Units Intracatheter Once Nicholas Lose, MD       sodium chloride flush (NS) 0.9 % injection 10 mL  10 mL Intracatheter Once Nicholas Lose, MD        PHYSICAL EXAMINATION: ECOG PERFORMANCE STATUS: 1 - Symptomatic but completely ambulatory  Vitals:   01/13/21 1023  BP: (!) 153/93  Pulse: 85  Resp: 16  Temp: 97.6 F (36.4 C)  SpO2: 100%   Filed Weights   01/13/21 1023  Weight: (!) 316 lb 14.4 oz (143.7 kg)    LABORATORY DATA:  I have reviewed the data as listed CMP Latest Ref Rng & Units 12/23/2020 12/02/2020 11/16/2020  Glucose 70 - 99 mg/dL 116(H) 112(H) 102(H)  BUN 6 - 20 mg/dL 10 10 10   Creatinine 0.44 - 1.00 mg/dL 0.83 0.88 0.87  Sodium 135 - 145 mmol/L 139 141 144  Potassium 3.5 - 5.1 mmol/L 3.2(L) 3.1(L) 4.0  Chloride 98 - 111 mmol/L 99 103 103  CO2 22 - 32 mmol/L 32 27 30  Calcium 8.9 - 10.3 mg/dL 9.3 9.6 10.4(H)  Total Protein 6.5 - 8.1 g/dL 7.4 7.0 7.5  Total Bilirubin 0.3 - 1.2 mg/dL 0.9 0.9 1.0  Alkaline Phos 38 - 126 U/L 63 67 64  AST 15 - 41 U/L 23 23 21   ALT 0 - 44 U/L 18 19 23     Lab Results  Component Value Date   WBC 6.9 12/23/2020   HGB 11.2 (L) 12/23/2020   HCT 35.8 (L) 12/23/2020   MCV 84.4 12/23/2020   PLT 201 12/23/2020   NEUTROABS 4.9 12/23/2020    ASSESSMENT & PLAN:  Malignant neoplasm of lower-outer quadrant of right breast of female, estrogen receptor positive (Pamela [name]) 05/03/2020:Screening mammogram showed a 1.0cm mass at the 6 o'clock position in the right breast. Diagnostic mammogram and US showed the 1.2cm mass at the 6 o'clock  position in the right breast. Biopsy showed IDC, grade 3, HER-2 positive (3+), ER 15% weak, PR-, Ki67 80%.   06/02/2020:Right lumpectomy (Pamela [name]): invasive and in situ ductal carcinoma, 1.3cm, clear margins, 1 right axillary lymph node negative for carcinoma. ER 15% weak, PR-,HER-2 positive (3+) Ki67 80%.   Treatment plan: 1.  Adjuvant chemotherapy with Taxol Herceptin followed by Herceptin maintenance.  Stopped after 11 cycles of Taxol  2.  Adjuvant radiation therapy 11/23/2020-12/19/2020 3.  Adjuvant antiestrogen therapy ----------------------------------------------------------------------------------------------------- Current Treatment Herceptin maintenance ECHO 08/30/20: EF 60-65% Herceptin toxicities: None Chemotherapy-induced anemia: Monitoring hemoglobin today is 10.7. She will get the Mirena ring removed We will check [place] and estradiol levels today to see if she is in menopause to determine which would be the appropriate antiestrogen pill.  Return to clinic every 3 weeks for Herceptin every 6 weeks for follow-up with me.      No orders of the defined types were placed in this encounter.  The  patient has a good understanding of the overall plan. she agrees with it. she will call with any problems that may develop before the next visit here.  Total time spent: 30 mins including face to face time and time spent for planning, charting and coordination of care  Rulon Eisenmenger, MD, MPH 01/13/2021  I, Thana Ates, am acting as scribe for Dr. Nicholas Lose.  I have reviewed the above documentation for accuracy and completeness, and I agree with the above.

## 2021-01-12 ENCOUNTER — Encounter: Payer: 59 | Admitting: Rehabilitation

## 2021-01-13 ENCOUNTER — Inpatient Hospital Stay: Payer: 59 | Attending: Hematology and Oncology

## 2021-01-13 ENCOUNTER — Inpatient Hospital Stay (HOSPITAL_BASED_OUTPATIENT_CLINIC_OR_DEPARTMENT_OTHER): Payer: 59 | Admitting: Hematology and Oncology

## 2021-01-13 ENCOUNTER — Other Ambulatory Visit: Payer: Self-pay

## 2021-01-13 ENCOUNTER — Inpatient Hospital Stay: Payer: 59

## 2021-01-13 ENCOUNTER — Encounter: Payer: Self-pay | Admitting: *Deleted

## 2021-01-13 ENCOUNTER — Other Ambulatory Visit: Payer: Self-pay | Admitting: *Deleted

## 2021-01-13 DIAGNOSIS — T451X5A Adverse effect of antineoplastic and immunosuppressive drugs, initial encounter: Secondary | ICD-10-CM | POA: Diagnosis not present

## 2021-01-13 DIAGNOSIS — N6315 Unspecified lump in the right breast, overlapping quadrants: Secondary | ICD-10-CM | POA: Diagnosis not present

## 2021-01-13 DIAGNOSIS — Z170 Estrogen receptor positive status [ER+]: Secondary | ICD-10-CM | POA: Insufficient documentation

## 2021-01-13 DIAGNOSIS — Z885 Allergy status to narcotic agent status: Secondary | ICD-10-CM | POA: Diagnosis not present

## 2021-01-13 DIAGNOSIS — D6481 Anemia due to antineoplastic chemotherapy: Secondary | ICD-10-CM | POA: Diagnosis not present

## 2021-01-13 DIAGNOSIS — C50511 Malignant neoplasm of lower-outer quadrant of right female breast: Secondary | ICD-10-CM | POA: Diagnosis present

## 2021-01-13 DIAGNOSIS — Z95828 Presence of other vascular implants and grafts: Secondary | ICD-10-CM

## 2021-01-13 DIAGNOSIS — Z5112 Encounter for antineoplastic immunotherapy: Secondary | ICD-10-CM | POA: Diagnosis present

## 2021-01-13 DIAGNOSIS — Z79899 Other long term (current) drug therapy: Secondary | ICD-10-CM | POA: Diagnosis not present

## 2021-01-13 LAB — CBC WITH DIFFERENTIAL (CANCER CENTER ONLY)
Abs Immature Granulocytes: 0.01 10*3/uL (ref 0.00–0.07)
Basophils Absolute: 0 10*3/uL (ref 0.0–0.1)
Basophils Relative: 1 %
Eosinophils Absolute: 0.2 10*3/uL (ref 0.0–0.5)
Eosinophils Relative: 3 %
HCT: 35.9 % — ABNORMAL LOW (ref 36.0–46.0)
Hemoglobin: 11.1 g/dL — ABNORMAL LOW (ref 12.0–15.0)
Immature Granulocytes: 0 %
Lymphocytes Relative: 22 %
Lymphs Abs: 1 10*3/uL (ref 0.7–4.0)
MCH: 25.9 pg — ABNORMAL LOW (ref 26.0–34.0)
MCHC: 30.9 g/dL (ref 30.0–36.0)
MCV: 83.7 fL (ref 80.0–100.0)
Monocytes Absolute: 0.6 10*3/uL (ref 0.1–1.0)
Monocytes Relative: 12 %
Neutro Abs: 3 10*3/uL (ref 1.7–7.7)
Neutrophils Relative %: 62 %
Platelet Count: 234 10*3/uL (ref 150–400)
RBC: 4.29 MIL/uL (ref 3.87–5.11)
RDW: 14.2 % (ref 11.5–15.5)
WBC Count: 4.7 10*3/uL (ref 4.0–10.5)
nRBC: 0 % (ref 0.0–0.2)

## 2021-01-13 LAB — CMP (CANCER CENTER ONLY)
ALT: 17 U/L (ref 0–44)
AST: 20 U/L (ref 15–41)
Albumin: 4 g/dL (ref 3.5–5.0)
Alkaline Phosphatase: 67 U/L (ref 38–126)
Anion gap: 10 (ref 5–15)
BUN: 9 mg/dL (ref 6–20)
CO2: 29 mmol/L (ref 22–32)
Calcium: 9.5 mg/dL (ref 8.9–10.3)
Chloride: 104 mmol/L (ref 98–111)
Creatinine: 0.83 mg/dL (ref 0.44–1.00)
GFR, Estimated: >60 mL/min (ref >60)
Glucose, Bld: 106 mg/dL — ABNORMAL HIGH (ref 70–99)
Potassium: 3.5 mmol/L (ref 3.5–5.1)
Sodium: 143 mmol/L (ref 135–145)
Total Bilirubin: 0.9 mg/dL (ref 0.3–1.2)
Total Protein: 7.1 g/dL (ref 6.5–8.1)

## 2021-01-13 MED ORDER — SODIUM CHLORIDE 0.9 % IV SOLN: Freq: Once | INTRAVENOUS | Status: AC

## 2021-01-13 MED ORDER — DIPHENHYDRAMINE HCL 50 MG/ML IJ SOLN
50.0000 mg | Freq: Once | INTRAMUSCULAR | Status: AC
  Administered 2021-01-13: 50 mg via INTRAVENOUS
  Filled 2021-01-13: qty 1

## 2021-01-13 MED ORDER — HEPARIN SOD (PORK) LOCK FLUSH 100 UNIT/ML IV SOLN
500.0000 [IU] | Freq: Once | INTRAVENOUS | Status: AC | PRN
  Administered 2021-01-13: 500 [IU]

## 2021-01-13 MED ORDER — TRASTUZUMAB-ANNS CHEMO 150 MG IV SOLR
900.0000 mg | Freq: Once | INTRAVENOUS | Status: AC
  Administered 2021-01-13: 900 mg via INTRAVENOUS
  Filled 2021-01-13: qty 42.86

## 2021-01-13 MED ORDER — SODIUM CHLORIDE 0.9% FLUSH
10.0000 mL | INTRAVENOUS | Status: DC | PRN
  Administered 2021-01-13: 10 mL

## 2021-01-13 MED ORDER — CYANOCOBALAMIN 1000 MCG/ML IJ SOLN
1000.0000 ug | Freq: Once | INTRAMUSCULAR | Status: AC
  Administered 2021-01-13: 1000 ug via INTRAMUSCULAR
  Filled 2021-01-13: qty 1

## 2021-01-13 MED ORDER — ACETAMINOPHEN 325 MG PO TABS
650.0000 mg | ORAL_TABLET | Freq: Once | ORAL | Status: AC
  Administered 2021-01-13: 650 mg via ORAL
  Filled 2021-01-13: qty 2

## 2021-01-13 NOTE — Patient Instructions (Signed)
[facility] MEDICAL ONCOLOGY  Discharge Instructions: Thank you for choosing [facility] to provide your oncology and hematology care.   If you have a lab appointment with the Cancer Center, please go directly to the Cancer Center and check in at the registration area.   Wear comfortable clothing and clothing appropriate for easy access to any Portacath or PICC line.   We strive to give you quality time with your provider. You may need to reschedule your appointment if you arrive late (15 or more minutes).  Arriving late affects you and other patients whose appointments are after yours.  Also, if you miss three or more appointments without notifying the office, you may be dismissed from the clinic at the provider's discretion.      For prescription refill requests, have your pharmacy contact our office and allow 72 hours for refills to be completed.    Today you received the following chemotherapy and/or immunotherapy agents : Herceptin     To help prevent nausea and vomiting after your treatment, we encourage you to take your nausea medication as directed.  BELOW ARE SYMPTOMS THAT SHOULD BE REPORTED IMMEDIATELY: *FEVER GREATER THAN 100.4 F (38 C) OR HIGHER *CHILLS OR SWEATING *NAUSEA AND VOMITING THAT IS NOT CONTROLLED WITH YOUR NAUSEA MEDICATION *UNUSUAL SHORTNESS OF BREATH *UNUSUAL BRUISING OR BLEEDING *URINARY PROBLEMS (pain or burning when urinating, or frequent urination) *BOWEL PROBLEMS (unusual diarrhea, constipation, pain near the anus) TENDERNESS IN MOUTH AND THROAT WITH OR WITHOUT PRESENCE OF ULCERS (sore throat, sores in mouth, or a toothache) UNUSUAL RASH, SWELLING OR PAIN  UNUSUAL VAGINAL DISCHARGE OR ITCHING   Items with * indicate a potential emergency and should be followed up as soon as possible or go to the Emergency Department if any problems should occur.  Please show the CHEMOTHERAPY ALERT CARD or IMMUNOTHERAPY ALERT CARD at check-in to  the Emergency Department and triage nurse.  Should you have questions after your visit or need to cancel or reschedule your appointment, please contact [facility]  Dept: [phone]  and follow the prompts.  Office hours are 8:00 a.m. to 4:30 p.m. Monday - Friday. Please note that voicemails left after 4:00 p.m. may not be returned until the following business day.  We are closed weekends and major holidays. You have access to a nurse at all times for urgent questions. Please call the main number to the clinic Dept: [phone] and follow the prompts.   For any non-urgent questions, you may also contact your provider using MyChart. We now offer e-Visits for anyone 18 and older to request care online for non-urgent symptoms. For details visit [email or web address].   Also download the MyChart app! Go to the app store, search "MyChart", open the app, select [place], and log in with your MyChart username and password.  Due to Covid, a mask is required upon entering the hospital/clinic. If you do not have a mask, one will be given to you upon arrival. For doctor visits, patients may have 1 support person aged 18 or older with them. For treatment visits, patients cannot have anyone with them due to current Covid guidelines and our immunocompromised population.   

## 2021-01-13 NOTE — Assessment & Plan Note (Signed)
05/03/2020:Screening mammogram showed a 1.0cm mass at the 6 o'clock position in the right breast. Diagnostic mammogram and US showed the 1.2cm mass at the 6 o'clock position in the right breast. Biopsy showed IDC, grade 3, HER-2 positive (3+), ER 15% weak, PR-, Ki67 80%.  06/02/2020:Right lumpectomy ([place]): invasive and in situ ductal carcinoma, 1.3cm, clear margins, 1 right axillary lymph node negative for carcinoma. ER 15% weak, PR-,HER-2 positive (3+) Ki67 80%.  Treatment plan: 1.Adjuvant chemotherapy with Taxol Herceptinfollowed byHerceptin maintenance.  Stopped after 11 cycles of Taxol  2.Adjuvant radiation therapy 11/23/2020-12/19/2020 3.Adjuvant antiestrogen therapy ----------------------------------------------------------------------------------------------------- CurrentTreatment Herceptin maintenance ECHO6/28/22: EF 60-65% Herceptin toxicities: None Chemotherapy-induced anemia: Monitoring hemoglobin today is 10.7. Return to clinic every 3 weeks for Herceptin every 6 weeks for follow-up with me.

## 2021-01-14 LAB — FOLLICLE STIMULATING HORMONE: FSH: 28 m[IU]/mL

## 2021-01-17 ENCOUNTER — Ambulatory Visit: Payer: 59 | Admitting: Rehabilitation

## 2021-01-18 ENCOUNTER — Telehealth (INDEPENDENT_AMBULATORY_CARE_PROVIDER_SITE_OTHER): Payer: 59 | Admitting: Nurse Practitioner

## 2021-01-18 ENCOUNTER — Encounter: Payer: Self-pay | Admitting: Nurse Practitioner

## 2021-01-18 ENCOUNTER — Other Ambulatory Visit: Payer: Self-pay

## 2021-01-18 ENCOUNTER — Ambulatory Visit (INDEPENDENT_AMBULATORY_CARE_PROVIDER_SITE_OTHER): Payer: 59

## 2021-01-18 VITALS — BP 132/86 | HR 71 | Temp 97.6°F | Ht 69.0 in | Wt 310.0 lb

## 2021-01-18 VITALS — BP 142/103 | HR 78 | Temp 98.3°F | Resp 20 | Ht 69.0 in | Wt 313.2 lb

## 2021-01-18 DIAGNOSIS — Z95828 Presence of other vascular implants and grafts: Secondary | ICD-10-CM

## 2021-01-18 DIAGNOSIS — R059 Cough, unspecified: Secondary | ICD-10-CM

## 2021-01-18 DIAGNOSIS — J029 Acute pharyngitis, unspecified: Secondary | ICD-10-CM

## 2021-01-18 DIAGNOSIS — Z20822 Contact with and (suspected) exposure to covid-19: Secondary | ICD-10-CM

## 2021-01-18 LAB — POC INFLUENZA A&B (BINAX/QUICKVUE)
Influenza A, POC: NEGATIVE
Influenza B, POC: NEGATIVE

## 2021-01-18 LAB — POC COVID19 BINAXNOW: SARS Coronavirus 2 Ag: POSITIVE — AB

## 2021-01-18 LAB — POCT RAPID STREP A (OFFICE): Rapid Strep A Screen: NEGATIVE

## 2021-01-18 MED ORDER — DIPHENHYDRAMINE HCL 50 MG/ML IJ SOLN: 50.0000 mg | Freq: Once | INTRAMUSCULAR | Status: AC | PRN

## 2021-01-18 MED ORDER — METHYLPREDNISOLONE SODIUM SUCC 125 MG IJ SOLR: 125.0000 mg | Freq: Once | INTRAMUSCULAR | Status: AC | PRN

## 2021-01-18 MED ORDER — EPINEPHRINE 0.3 MG/0.3ML IJ SOAJ: 0.3000 mg | Freq: Once | INTRAMUSCULAR | Status: AC | PRN

## 2021-01-18 MED ORDER — ALBUTEROL SULFATE HFA 108 (90 BASE) MCG/ACT IN AERS: 2.0000 | INHALATION_SPRAY | Freq: Once | RESPIRATORY_TRACT | Status: AC | PRN

## 2021-01-18 MED ORDER — BENZONATATE 100 MG PO CAPS: 100.0000 mg | ORAL_CAPSULE | Freq: Three times a day (TID) | ORAL | 0 refills | Status: DC | PRN

## 2021-01-18 MED ORDER — FAMOTIDINE IN NACL 20-0.9 MG/50ML-% IV SOLN: 20.0000 mg | Freq: Once | INTRAVENOUS | Status: AC | PRN

## 2021-01-18 MED ORDER — SODIUM CHLORIDE 0.9 % IV SOLN: INTRAVENOUS | Status: DC | PRN

## 2021-01-18 MED ORDER — GUAIFENESIN-DM 100-10 MG/5ML PO SYRP
5.0000 mL | ORAL_SOLUTION | ORAL | 0 refills | Status: DC | PRN
Start: 2021-01-18 — End: 2021-02-17

## 2021-01-18 MED ORDER — BEBTELOVIMAB 175 MG/2 ML IV (EUA)
175.0000 mg | Freq: Once | INTRAMUSCULAR | Status: AC
  Administered 2021-01-18: 175 mg via INTRAVENOUS

## 2021-01-18 MED ORDER — HEPARIN SOD (PORK) LOCK FLUSH 100 UNIT/ML IV SOLN
500.0000 [IU] | Freq: Once | INTRAVENOUS | Status: AC
  Administered 2021-01-18: 500 [IU] via INTRAVENOUS
  Filled 2021-01-18: qty 5

## 2021-01-18 NOTE — Progress Notes (Signed)
Diagnosis: COVID  Provider:  Marshell Garfinkel, MD  Procedure: Infusion  IV Type: Port a Cath, IV Location: R Chest  Bebtelovimab, Dose: 175 mg  Infusion Start Time: 1625  Infusion Stop Time: 1626  Post Infusion IV Care: Observation period completed and Port a Cath Deaccessed/Flushed  Discharge: Condition: Good, Destination: Home . AVS provided to patient.   Performed by:  Temple Pacini

## 2021-01-18 NOTE — Progress Notes (Addendum)
This visit occurred during the SARS-CoV-2 public health emergency.  Safety protocols were in place, including screening questions prior to the visit, additional usage of staff PPE, and extensive cleaning of exam room while observing appropriate contact time as indicated for disinfecting solutions.  Subjective:     Patient ID: Pamela [name] , female    DOB: Mar 24, 1965 , 55 y.o.   MRN: [id number]   Chief Complaint  Patient presents with   URI    Patient stated her symptoms started on Monday. She has some fatigue,coughing, fever, runny nose and sore throat She has been taking some coricidin and tylenol.     HPI  She has been sick since this weekend (3-4 days). Sunday she had a fever. She is feeling little better but still has the congestion, cough, weakness, fatigue and body ache. Rapid test in the office was positive for covid. She has not had a flu shot. She is a breast cancer patient.   URI  Associated symptoms include congestion, coughing, headaches and rhinorrhea. Pertinent negatives include no chest pain, diarrhea or wheezing.    Past Medical History:  Diagnosis Date   Asthma    DUB (dysfunctional uterine bleeding) 2007   Elevated cholesterol    Family history of breast cancer 05/12/2020   Family history of lung cancer 05/12/2020   H/O menorrhagia 05/01/2006   History of ovarian cyst 2007   Hypertension 03/31/2005   Increased BMI 07/11/2006   Migraine    N&V (nausea and vomiting)    Obesity 2007   Obstructive sleep apnea syndrome, moderate 10/27/2013   no cpap   Weight loss 07/11/2006     Family History  Problem Relation Age of Onset   Diabetes Maternal Grandmother    Hypertension Mother    Hypertension Father    Heart attack Father    Bone cancer Maternal Grandfather        dx after 50   Breast cancer Maternal Aunt        dx 63s   Lung cancer Maternal Aunt        dx after 50     Current Outpatient Medications:    amLODipine (NORVASC) 10 MG tablet, Take 1 tablet  (10 mg total) by mouth daily., Disp: 90 tablet, Rfl: 2   aspirin EC 81 MG tablet, Take 1 tablet (81 mg total) by mouth daily. Swallow whole., Disp: 30 tablet, Rfl: 11   Azilsartan-Chlorthalidone (EDARBYCLOR) 40-25 MG TABS, Take 1 tablet by mouth daily., Disp: 90 tablet, Rfl: 2   benzonatate (TESSALON PERLES) 100 MG capsule, Take 1 capsule (100 mg total) by mouth 3 (three) times daily as needed for cough., Disp: 30 capsule, Rfl: 0   guaiFENesin-dextromethorphan (ROBITUSSIN DM) 100-10 MG/5ML syrup, Take 5 mLs by mouth every 4 (four) hours as needed for cough., Disp: 118 mL, Rfl: 0   levonorgestrel (MIRENA, 52 MG,) 20 MCG/DAY IUD, 1 each by Intrauterine route once., Disp: , Rfl:    Multiple Vitamin (MULTIVITAMIN) LIQD, Take 5 mLs by mouth daily., Disp: , Rfl:    rosuvastatin (CRESTOR) 40 MG tablet, Take 1 tablet (40 mg total) by mouth daily., Disp: 90 tablet, Rfl: 3   VITAMIN D, CHOLECALCIFEROL, PO, Take 1 tablet by mouth daily., Disp: , Rfl:  No current facility-administered medications for this visit.  Facility-Administered Medications Ordered in Other Visits:    heparin lock flush 100 unit/mL, 500 Units, Intracatheter, Once, Gudena, Vinay, MD   sodium chloride flush (NS) 0.9 % injection 10 mL, 10  mL, Intracatheter, Once, Nicholas Lose, MD   Allergies  Allergen Reactions   Pollen Extract Shortness Of Breath   Latex Rash   Codeine Hives   Shellfish Allergy Hives   Betadine [Povidone Iodine] Rash     Review of Systems  Constitutional:  Positive for chills, fatigue and fever.  HENT:  Positive for congestion and rhinorrhea.   Respiratory:  Positive for cough. Negative for shortness of breath and wheezing.   Cardiovascular:  Negative for chest pain and palpitations.  Gastrointestinal:  Negative for constipation and diarrhea.  Musculoskeletal:  Positive for myalgias.  Neurological:  Positive for weakness and headaches.    Today's Vitals   01/18/21 0906  BP: 132/86  Pulse: 71  Temp: 97.6  F (36.4 C)  Weight: (!) 310 lb (140.6 kg)  Height: 5\' 9"  (1.753 m)   Body mass index is 45.78 kg/m.   Objective:  Physical Exam Constitutional:      Appearance: Normal appearance. She is obese.  HENT:     Head: Normocephalic and atraumatic.  Cardiovascular:     Rate and Rhythm: Normal rate and regular rhythm.     Pulses: Normal pulses.     Heart sounds: Normal heart sounds. No murmur heard. Pulmonary:     Effort: Pulmonary effort is normal. No respiratory distress.     Breath sounds: Normal breath sounds.  Skin:    General: Skin is warm and dry.     Capillary Refill: Capillary refill takes less than 2 seconds.  Neurological:     Mental Status: She is alert and oriented to person, place, and time.        Assessment And Plan:     1. Exposure to COVID-19 virus - bebtelovimab EUA injection SOLN 175 mg - 0.9 %  sodium chloride infusion - Hypersensitivity GRADE 1: Transient flushing or rash, or drug fever < 100.4 F - Hypersensitivity GRADE 2: Rash, flushing, urticaria, dyspnea, or drug fever = or > 100.4 F - Hypersensitivity GRADE 3: symptomatic bronchospasm, with or without urticaria, parenteral medication management indicated, allergy-related edema/angioedema, or hypotension - Hypersensitivity GRADE 4: Anaphylaxis - diphenhydrAMINE (BENADRYL) injection 50 mg - famotidine (PEPCID) 20 mg in sodium chloride 0.9 % 50 mL IVPB - methylPREDNISolone sodium succinate (SOLU-MEDROL) 125 mg in sodium chloride 0.9 % 50 mL IVPB - albuterol (VENTOLIN HFA) 108 (90 Base) MCG/ACT inhaler 2 puff - EPINEPHrine (EPI-PEN) injection 0.3 mg - POC COVID-19 - POC Influenza A&B(BINAX/QUICKVUE) -Sent referral for pt. To receive monoclonal antibodies due to her comorbidities and being a breast cancer pt.  -Advised pt if her symptoms get worse, she experiences any shortness of breath or chest pain to go the emergency room.    2. Cough, unspecified type - POC COVID-19 - POC Influenza  A&B(BINAX/QUICKVUE) - benzonatate (TESSALON PERLES) 100 MG capsule; Take 1 capsule (100 mg total) by mouth 3 (three) times daily as needed for cough.  Dispense: 30 capsule; Refill: 0 - guaiFENesin-dextromethorphan (ROBITUSSIN DM) 100-10 MG/5ML syrup; Take 5 mLs by mouth every 4 (four) hours as needed for cough.  Dispense: 118 mL; Refill: 0  3. Sore throat - POCT rapid strep A - POC Influenza A&B(BINAX/QUICKVUE)   The patient was encouraged to call or send a message through [facility] for any questions or concerns.   Follow up: if symptoms persist or do not get better.   Side effects and appropriate use of all the medication(s) were discussed with the patient today. Patient advised to use the medication(s) as directed by  their healthcare provider. The patient was encouraged to read, review, and understand all associated package inserts and contact our office with any questions or concerns. The patient accepts the risks of the treatment plan and had an opportunity to ask questions.   Advised patient to take Vitamin C, D, Zinc.  Keep yourself hydrated with a lot of water and rest. Take Delsym for cough and Mucinex as need. Take Tylenol or pain reliever every 4-6 hours as needed for pain/fever/body ache. Educated patient if symptoms get worse or if she experiences any SOB, chest pain or pain in her legs to seek immediate emergency care. Continue to monitor your oxygen levels. Call us if you have any questions. Quarantine for 5 days if tested positive and no symptoms or 10 days if tested positive and have symptoms. Wear a mask around other people.   Patient was given opportunity to ask questions. Patient verbalized understanding of the plan and was able to repeat key elements of the plan. All questions were answered to their satisfaction.  Raman Kiva Norland, DNP   I, Raman Janeann Paisley have reviewed all documentation for this visit. The documentation on 01/18/21 for the exam, diagnosis, procedures, and orders are  all accurate and complete.    THE PATIENT IS ENCOURAGED TO PRACTICE SOCIAL DISTANCING DUE TO THE COVID-19 PANDEMIC.

## 2021-01-18 NOTE — Patient Instructions (Signed)
10 Things You Can Do to Manage Your COVID-19 Symptoms at Home If you have possible or confirmed COVID-19 Stay home except to get medical care. Monitor your symptoms carefully. If your symptoms get worse, call your healthcare provider immediately. Get rest and stay hydrated. If you have a medical appointment, call the healthcare provider ahead of time and tell them that you have or may have COVID-19. For medical emergencies, call 911 and notify the dispatch personnel that you have or may have COVID-19. Cover your cough and sneezes with a tissue or use the inside of your elbow. Wash your hands often with soap and water for at least 20 seconds or clean your hands with an alcohol-based hand sanitizer that contains at least 60% alcohol. As much as possible, stay in a specific room and away from other people in your home. Also, you should use a separate bathroom, if available. If you need to be around other people in or outside of the home, wear a mask. Avoid sharing personal items with other people in your household, like dishes, towels, and bedding. Clean all surfaces that are touched often, like counters, tabletops, and doorknobs. Use household cleaning sprays or wipes according to the label instructions. [email or web address] Patient was given drug information sheet for Bebtelovimab.  Also given cost estimate sheet for Bebtelovimab.  Patient reviewed documentation and questions were answered.  Patient would like to proceed with treatment at this time.   09/18/2019 This information is not intended to replace advice given to you by your health care provider. Make sure you discuss any questions you have with your health care provider. Document Revised: 11/11/2020 Document Reviewed: 11/11/2020 Elsevier Patient Education  [place].

## 2021-01-19 ENCOUNTER — Encounter: Payer: Self-pay | Admitting: *Deleted

## 2021-01-19 ENCOUNTER — Telehealth: Payer: Self-pay | Admitting: *Deleted

## 2021-01-19 LAB — ESTRADIOL, ULTRA SENS: Estradiol, Sensitive: 14.4 pg/mL

## 2021-01-19 NOTE — Telephone Encounter (Signed)
CALLED PATIENT TO RESCHEDULE FU APPT. FOR 01-20-21 DUE TO PATIENT BEING SICK, PATIENT AGREED TO COME ON 02-17-21 @ 2:40 PM

## 2021-01-20 ENCOUNTER — Ambulatory Visit: Payer: 59 | Admitting: Radiation Oncology

## 2021-01-25 ENCOUNTER — Encounter (INDEPENDENT_AMBULATORY_CARE_PROVIDER_SITE_OTHER): Payer: Self-pay

## 2021-01-31 DIAGNOSIS — Z0289 Encounter for other administrative examinations: Secondary | ICD-10-CM

## 2021-02-02 ENCOUNTER — Ambulatory Visit: Payer: 59 | Attending: Radiation Oncology | Admitting: Rehabilitation

## 2021-02-02 DIAGNOSIS — C50511 Malignant neoplasm of lower-outer quadrant of right female breast: Secondary | ICD-10-CM | POA: Insufficient documentation

## 2021-02-02 DIAGNOSIS — R293 Abnormal posture: Secondary | ICD-10-CM | POA: Insufficient documentation

## 2021-02-02 DIAGNOSIS — M79622 Pain in left upper arm: Secondary | ICD-10-CM | POA: Insufficient documentation

## 2021-02-02 DIAGNOSIS — Z483 Aftercare following surgery for neoplasm: Secondary | ICD-10-CM | POA: Insufficient documentation

## 2021-02-02 DIAGNOSIS — Z170 Estrogen receptor positive status [ER+]: Secondary | ICD-10-CM | POA: Insufficient documentation

## 2021-02-02 DIAGNOSIS — M79621 Pain in right upper arm: Secondary | ICD-10-CM | POA: Insufficient documentation

## 2021-02-02 DIAGNOSIS — R600 Localized edema: Secondary | ICD-10-CM | POA: Insufficient documentation

## 2021-02-03 ENCOUNTER — Ambulatory Visit: Payer: 59 | Admitting: Radiation Oncology

## 2021-02-03 ENCOUNTER — Ambulatory Visit: Payer: 59

## 2021-02-03 ENCOUNTER — Other Ambulatory Visit: Payer: 59

## 2021-02-07 ENCOUNTER — Ambulatory Visit: Payer: 59

## 2021-02-08 ENCOUNTER — Encounter: Payer: Self-pay | Admitting: Hematology and Oncology

## 2021-02-08 NOTE — Progress Notes (Signed)
                                                                                                                                                             Patient Name: Pamela [name] MRN: [id number] DOB: 01/21/1966 Referring Physician: Lurline Del (Profile Not Attached) Date of Service: 12/20/2020 [facility]-,                                                         End Of Treatment Note  Diagnoses: C50.511-Malignant neoplasm of lower-outer quadrant of right female breast  Cancer Staging:  Cancer Staging  Malignant neoplasm of lower-outer quadrant of right breast of female, estrogen receptor positive ([place]) Staging form: Breast, AJCC 8th Edition - Clinical stage from 05/11/2020: Stage IA (cT1b, cN0, cM0, G3, ER+, PR-, HER2+) - Signed by Nicholas Lose, MD on 05/11/2020 Stage prefix: Initial diagnosis - Pathologic stage from 06/02/2020: Stage IA (pT1c, pN0, cM0, G3, ER+, PR-, HER2+) - Signed by Gardenia Phlegm, NP on 06/15/2020 Stage prefix: Initial diagnosis Histologic grading system: 3 grade system  Intent: Curative  Radiation Treatment Dates: 11/22/2020 through 12/20/2020 Site Technique Total Dose (Gy) Dose per Fx (Gy) Completed Fx Beam Energies  Breast, Right: Breast_Rt 3D 40.05/40.05 2.67 15/15 15X  Breast, Right: Breast_Rt_Bst 3D 10/10 2 5/5 6X, 10X   Narrative: The patient tolerated radiation therapy relatively well.   Plan: The patient will follow-up with radiation oncology in 87moand / or PRN.  -----------------------------------  SEppie Gibson MD

## 2021-02-09 ENCOUNTER — Ambulatory Visit: Payer: 59

## 2021-02-10 ENCOUNTER — Other Ambulatory Visit: Payer: 59

## 2021-02-10 ENCOUNTER — Ambulatory Visit: Payer: 59

## 2021-02-13 ENCOUNTER — Inpatient Hospital Stay: Payer: 59 | Attending: Hematology and Oncology

## 2021-02-13 ENCOUNTER — Other Ambulatory Visit: Payer: Self-pay

## 2021-02-13 ENCOUNTER — Inpatient Hospital Stay: Payer: 59

## 2021-02-13 VITALS — BP 151/74 | HR 65 | Temp 99.3°F | Resp 17 | Wt 313.5 lb

## 2021-02-13 DIAGNOSIS — T451X5A Adverse effect of antineoplastic and immunosuppressive drugs, initial encounter: Secondary | ICD-10-CM | POA: Diagnosis not present

## 2021-02-13 DIAGNOSIS — Z170 Estrogen receptor positive status [ER+]: Secondary | ICD-10-CM | POA: Insufficient documentation

## 2021-02-13 DIAGNOSIS — Z95828 Presence of other vascular implants and grafts: Secondary | ICD-10-CM

## 2021-02-13 DIAGNOSIS — D6481 Anemia due to antineoplastic chemotherapy: Secondary | ICD-10-CM | POA: Insufficient documentation

## 2021-02-13 DIAGNOSIS — C50511 Malignant neoplasm of lower-outer quadrant of right female breast: Secondary | ICD-10-CM | POA: Diagnosis present

## 2021-02-13 DIAGNOSIS — Z79899 Other long term (current) drug therapy: Secondary | ICD-10-CM | POA: Diagnosis not present

## 2021-02-13 DIAGNOSIS — Z885 Allergy status to narcotic agent status: Secondary | ICD-10-CM | POA: Insufficient documentation

## 2021-02-13 DIAGNOSIS — Z5112 Encounter for antineoplastic immunotherapy: Secondary | ICD-10-CM | POA: Insufficient documentation

## 2021-02-13 LAB — CMP (CANCER CENTER ONLY)
ALT: 12 U/L (ref 0–44)
AST: 17 U/L (ref 15–41)
Albumin: 3.9 g/dL (ref 3.5–5.0)
Alkaline Phosphatase: 61 U/L (ref 38–126)
Anion gap: 10 (ref 5–15)
BUN: 9 mg/dL (ref 6–20)
CO2: 27 mmol/L (ref 22–32)
Calcium: 9.3 mg/dL (ref 8.9–10.3)
Chloride: 105 mmol/L (ref 98–111)
Creatinine: 0.82 mg/dL (ref 0.44–1.00)
GFR, Estimated: >60 mL/min (ref >60)
Glucose, Bld: 107 mg/dL — ABNORMAL HIGH (ref 70–99)
Potassium: 3.4 mmol/L — ABNORMAL LOW (ref 3.5–5.1)
Sodium: 142 mmol/L (ref 135–145)
Total Bilirubin: 1 mg/dL (ref 0.3–1.2)
Total Protein: 7.1 g/dL (ref 6.5–8.1)

## 2021-02-13 LAB — CBC WITH DIFFERENTIAL (CANCER CENTER ONLY)
Abs Immature Granulocytes: 0.05 10*3/uL (ref 0.00–0.07)
Basophils Absolute: 0 10*3/uL (ref 0.0–0.1)
Basophils Relative: 1 %
Eosinophils Absolute: 0.2 10*3/uL (ref 0.0–0.5)
Eosinophils Relative: 4 %
HCT: 34.7 % — ABNORMAL LOW (ref 36.0–46.0)
Hemoglobin: 10.8 g/dL — ABNORMAL LOW (ref 12.0–15.0)
Immature Granulocytes: 1 %
Lymphocytes Relative: 24 %
Lymphs Abs: 1.3 10*3/uL (ref 0.7–4.0)
MCH: 25.1 pg — ABNORMAL LOW (ref 26.0–34.0)
MCHC: 31.1 g/dL (ref 30.0–36.0)
MCV: 80.5 fL (ref 80.0–100.0)
Monocytes Absolute: 0.6 10*3/uL (ref 0.1–1.0)
Monocytes Relative: 11 %
Neutro Abs: 3.2 10*3/uL (ref 1.7–7.7)
Neutrophils Relative %: 59 %
Platelet Count: 230 10*3/uL (ref 150–400)
RBC: 4.31 MIL/uL (ref 3.87–5.11)
RDW: 14.6 % (ref 11.5–15.5)
WBC Count: 5.4 10*3/uL (ref 4.0–10.5)
nRBC: 0 % (ref 0.0–0.2)

## 2021-02-13 MED ORDER — SODIUM CHLORIDE 0.9 % IV SOLN: Freq: Once | INTRAVENOUS | Status: AC

## 2021-02-13 MED ORDER — HEPARIN SOD (PORK) LOCK FLUSH 100 UNIT/ML IV SOLN
500.0000 [IU] | Freq: Once | INTRAVENOUS | Status: AC | PRN
  Administered 2021-02-13: 500 [IU]

## 2021-02-13 MED ORDER — SODIUM CHLORIDE 0.9% FLUSH
10.0000 mL | INTRAVENOUS | Status: DC | PRN
  Administered 2021-02-13: 10 mL

## 2021-02-13 MED ORDER — ACETAMINOPHEN 325 MG PO TABS
650.0000 mg | ORAL_TABLET | Freq: Once | ORAL | Status: AC
  Administered 2021-02-13: 650 mg via ORAL
  Filled 2021-02-13: qty 2

## 2021-02-13 MED ORDER — TRASTUZUMAB-ANNS CHEMO 150 MG IV SOLR
900.0000 mg | Freq: Once | INTRAVENOUS | Status: AC
  Administered 2021-02-13: 900 mg via INTRAVENOUS
  Filled 2021-02-13: qty 42.86

## 2021-02-13 MED ORDER — SODIUM CHLORIDE 0.9% FLUSH
10.0000 mL | Freq: Once | INTRAVENOUS | Status: AC
Start: 2021-02-13 — End: 2021-02-13
  Administered 2021-02-13: 10 mL

## 2021-02-13 MED ORDER — DIPHENHYDRAMINE HCL 50 MG/ML IJ SOLN
50.0000 mg | Freq: Once | INTRAMUSCULAR | Status: AC
  Administered 2021-02-13: 50 mg via INTRAVENOUS
  Filled 2021-02-13: qty 1

## 2021-02-13 NOTE — Patient Instructions (Signed)
[facility]  Discharge Instructions: ?Thank you for choosing [facility] to provide your oncology and hematology care.  ? ?If you have a lab appointment with the Cancer Center, please go directly to the Cancer Center and check in at the registration area. ?  ?Wear comfortable clothing and clothing appropriate for easy access to any Portacath or PICC line.  ? ?We strive to give you quality time with your provider. You may need to reschedule your appointment if you arrive late (15 or more minutes).  Arriving late affects you and other patients whose appointments are after yours.  Also, if you miss three or more appointments without notifying the office, you may be dismissed from the clinic at the provider?s discretion.    ?  ?For prescription refill requests, have your pharmacy contact our office and allow 72 hours for refills to be completed.   ? ?Today you received the following chemotherapy and/or immunotherapy agents: Trastuzumab    ?  ?To help prevent nausea and vomiting after your treatment, we encourage you to take your nausea medication as directed. ? ?BELOW ARE SYMPTOMS THAT SHOULD BE REPORTED IMMEDIATELY: ?*FEVER GREATER THAN 100.4 F (38 ?C) OR HIGHER ?*CHILLS OR SWEATING ?*NAUSEA AND VOMITING THAT IS NOT CONTROLLED WITH YOUR NAUSEA MEDICATION ?*UNUSUAL SHORTNESS OF BREATH ?*UNUSUAL BRUISING OR BLEEDING ?*URINARY PROBLEMS (pain or burning when urinating, or frequent urination) ?*BOWEL PROBLEMS (unusual diarrhea, constipation, pain near the anus) ?TENDERNESS IN MOUTH AND THROAT WITH OR WITHOUT PRESENCE OF ULCERS (sore throat, sores in mouth, or a toothache) ?UNUSUAL RASH, SWELLING OR PAIN  ?UNUSUAL VAGINAL DISCHARGE OR ITCHING  ? ?Items with * indicate a potential emergency and should be followed up as soon as possible or go to the Emergency Department if any problems should occur. ? ?Please show the CHEMOTHERAPY ALERT CARD or IMMUNOTHERAPY ALERT CARD at check-in  to the Emergency Department and triage nurse. ? ?Should you have questions after your visit or need to cancel or reschedule your appointment, please contact [facility]  Dept: [phone]  and follow the prompts.  Office hours are 8:00 a.m. to 4:30 p.m. Monday - Friday. Please note that voicemails left after 4:00 p.m. may not be returned until the following business day.  We are closed weekends and major holidays. You have access to a nurse at all times for urgent questions. Please call the main number to the clinic Dept: [phone] and follow the prompts. ? ? ?For any non-urgent questions, you may also contact your provider using MyChart. We now offer e-Visits for anyone 18 and older to request care online for non-urgent symptoms. For details visit [email or web address]. ?  ?Also download the MyChart app! Go to the app store, search "MyChart", open the app, select [facility], and log in with your MyChart username and password. ? ?Due to Covid, a mask is required upon entering the hospital/clinic. If you do not have a mask, one will be given to you upon arrival. For doctor visits, patients may have 1 support person aged 18 or older with them. For treatment visits, patients cannot have anyone with them due to current Covid guidelines and our immunocompromised population.  ? ?

## 2021-02-17 ENCOUNTER — Other Ambulatory Visit: Payer: Self-pay

## 2021-02-17 ENCOUNTER — Encounter: Payer: Self-pay | Admitting: Radiation Oncology

## 2021-02-17 ENCOUNTER — Ambulatory Visit
Admission: RE | Admit: 2021-02-17 | Discharge: 2021-02-17 | Disposition: A | Discharge status: Home / Self Care | Payer: 59 | Source: Ambulatory Visit | Attending: Radiation Oncology | Admitting: Radiation Oncology

## 2021-02-17 VITALS — BP 150/60 | HR 71 | Temp 96.7°F | Resp 18 | Ht 69.0 in | Wt 317.5 lb

## 2021-02-17 DIAGNOSIS — Z79899 Other long term (current) drug therapy: Secondary | ICD-10-CM | POA: Diagnosis not present

## 2021-02-17 DIAGNOSIS — Z170 Estrogen receptor positive status [ER+]: Secondary | ICD-10-CM | POA: Diagnosis not present

## 2021-02-17 DIAGNOSIS — C50511 Malignant neoplasm of lower-outer quadrant of right female breast: Secondary | ICD-10-CM | POA: Insufficient documentation

## 2021-02-17 DIAGNOSIS — Z7982 Long term (current) use of aspirin: Secondary | ICD-10-CM | POA: Diagnosis not present

## 2021-02-17 DIAGNOSIS — Z923 Personal history of irradiation: Secondary | ICD-10-CM | POA: Insufficient documentation

## 2021-02-17 NOTE — Progress Notes (Signed)
Pamela [name] presents today for follow-up after completing radiation to her right breast on 12/20/2020  Pain: Reports continued discomfort to the right breast. States it tends to be constant, but she is managing (prefers not to take OTC pain medication at this time) Skin: Reports skin is intact and healing well. Almost back to baseline color/texture ROM: Continued shoulder stiffness Lymphedema: Patient denies MedOnc F/U: Last saw Dr. Nicholas Lose on 01/13/2021 and is scheduled for F/U before her next infusion appointment on 03/03/2021 Other issues of note:   Pt reports Yes No Comments  Tamoxifen []  [x]    Letrozole []  [x]    Anastrazole []  [x]    Mammogram [x]  Date: TBD (managed through GYN) [] 

## 2021-02-17 NOTE — Progress Notes (Signed)
Radiation Oncology         [phone] ________________________________  Name: Pamela [name] MRN: [id number]  Date: 02/17/2021  DOB: [birth year]  Follow-Up Visit Note  Outpatient  CC: Pamela Chard, MD  Magrinat, Virgie Dad, MD  Diagnosis and Prior Radiotherapy:    ICD-10-CM   1. Malignant neoplasm of lower-outer quadrant of right breast of female, estrogen receptor positive ([place])  C50.511    Z17.0       CHIEF COMPLAINT: Here for follow-up and surveillance of breast cancer  Narrative:  The patient returns today for routine follow-up.  [name] presents today for follow-up after completing radiation to her right breast on 12/20/2020  Pain: Reports continued discomfort to the right breast. States it tends to be constant, but she is managing (prefers not to take OTC pain medication at this time) Skin: Reports skin is intact and healing well. Almost back to baseline color/texture ROM: Continued shoulder stiffness Lymphedema: Patient denies MedOnc F/U: Last saw Dr. Nicholas Lose on 01/13/2021 and is scheduled for F/U before her next infusion appointment on 03/03/2021 Other issues of note:   Pt reports Yes No Comments  Tamoxifen []  [x]    Letrozole []  [x]    Anastrazole []  [x]    Mammogram [x]  Date: TBD (managed through GYN) []                                   ALLERGIES:  is allergic to pollen extract, latex, codeine, shellfish allergy, and betadine [povidone iodine].  Meds: Current Outpatient Medications  Medication Sig Dispense Refill   amLODipine (NORVASC) 10 MG tablet Take 1 tablet (10 mg total) by mouth daily. 90 tablet 2   aspirin EC 81 MG tablet Take 1 tablet (81 mg total) by mouth daily. Swallow whole. 30 tablet 11   Azilsartan-Chlorthalidone (EDARBYCLOR) 40-25 MG TABS Take 1 tablet by mouth daily. 90 tablet 2   hydrOXYzine (ATARAX) 25 MG tablet Take 1 tablet by mouth every 12 (twelve) hours as needed.     levonorgestrel (MIRENA, 52 MG,) 20 MCG/DAY IUD 1 each by  Intrauterine route once.     lidocaine-prilocaine (EMLA) cream Apply 1 application topically once.     Multiple Vitamin (MULTIVITAMIN) LIQD Take 5 mLs by mouth daily.     rosuvastatin (CRESTOR) 40 MG tablet Take 1 tablet (40 mg total) by mouth daily. 90 tablet 3   VITAMIN D, CHOLECALCIFEROL, PO Take 1 tablet by mouth daily.     Current Facility-Administered Medications  Medication Dose Route Frequency Provider Last Rate Last Admin   0.9 %  sodium chloride infusion   Intravenous PRN Ghumman, Ramandeep, NP       Facility-Administered Medications Ordered in Other Encounters  Medication Dose Route Frequency Provider Last Rate Last Admin   heparin lock flush 100 unit/mL  500 Units Intracatheter Once Nicholas Lose, MD       sodium chloride flush (NS) 0.9 % injection 10 mL  10 mL Intracatheter Once Nicholas Lose, MD        Physical Findings: The patient is in no acute distress. Patient is alert and oriented.  height is 5\' 9"  (1.753 m) and weight is 317 lb 8 oz (144 kg) (abnormal). Her temporal temperature is 96.7 F (35.9 C) (abnormal). Her blood pressure is 150/60 (abnormal) and her pulse is 71. Her respiration is 18 and oxygen saturation is 100%. .    Satisfactory skin healing in radiotherapy fields. Skin  slightly dry   Lab Findings: Lab Results  Component Value Date   WBC 5.4 02/13/2021   HGB 10.8 (L) 02/13/2021   HCT 34.7 (L) 02/13/2021   MCV 80.5 02/13/2021   PLT 230 02/13/2021    Radiographic Findings: No results found.  Impression/Plan: Healing well from radiotherapy to the breast tissue.  Continue skin care with topical Vitamin E Oil and / or lotion for at least 2 more months for further healing.  I encouraged her to continue with yearly mammography as appropriate (for intact breast tissue) and followup with medical oncology. I will see her back on an as-needed basis. I have encouraged her to call if she has any issues or concerns in the future. I wished her the very  best.  On date of service, in total, I spent 10 minutes on this encounter. Patient was seen in person.  _____________________________________   Eppie Gibson, MD

## 2021-02-23 ENCOUNTER — Other Ambulatory Visit: Payer: 59

## 2021-02-23 ENCOUNTER — Encounter (INDEPENDENT_AMBULATORY_CARE_PROVIDER_SITE_OTHER): Payer: Self-pay

## 2021-02-23 ENCOUNTER — Ambulatory Visit: Payer: 59

## 2021-02-23 ENCOUNTER — Ambulatory Visit: Payer: 59 | Admitting: Hematology and Oncology

## 2021-03-02 NOTE — Progress Notes (Signed)
Patient Care Team: [name], MD as PCP - General (Internal Medicine) Mcarthur Rossetti, MD as Consulting Physician (Orthopedic Surgery) Mauro Kaufmann, RN as Oncology Nurse Navigator Rockwell Germany, RN as Oncology Nurse Navigator [name], MD as Consulting Physician (General Surgery) Nicholas Lose, MD as Consulting Physician (Hematology and Oncology) Eppie Gibson, MD as Attending Physician (Radiation Oncology)  DIAGNOSIS:    ICD-10-CM   1. Malignant neoplasm of lower-outer quadrant of right breast of female, estrogen receptor positive ([place])  C50.511    Z17.0       SUMMARY OF ONCOLOGIC HISTORY: Oncology History  Malignant neoplasm of lower-outer quadrant of right breast of female, estrogen receptor positive ([place])  05/03/2020 Initial Diagnosis   Screening mammogram showed a 1.0cm mass at the 6 o'clock position in the right breast. Diagnostic mammogram and US showed the 1.2cm mass at the 6 o'clock position in the right breast. Biopsy showed IDC, grade 3, HER-2 positive (3+), ER 15% weak, PR-, Ki67 80%.   05/11/2020 Cancer Staging   Staging form: Breast, AJCC 8th Edition - Clinical stage from 05/11/2020: Stage IA (cT1b, cN0, cM0, G3, ER+, PR-, HER2+) - Signed by Nicholas Lose, MD on 05/11/2020 Stage prefix: Initial diagnosis    05/19/2020 Genetic Testing   Negative hereditary cancer genetic testing: no pathogenic variants detected in [facility] Breast STAT Panel and Common Hereditary Cancers Panel.  The report dates are May 19, 2020 (STAT) and May 23, 2020 (Common Hereditary).   The Common Hereditary Cancers Panel offered by [facility] includes sequencing and/or deletion duplication testing of the following 47 genes: APC, ATM, AXIN2, BARD1, BMPR1A, BRCA1, BRCA2, BRIP1, CDH1, CDK4, CDKN2A (p14ARF), CDKN2A (p16INK4a), CHEK2, CTNNA1, DICER1, EPCAM (Deletion/duplication testing only), GREM1 (promoter region deletion/duplication testing only), GREM1, HOXB13, KIT, MEN1, MLH1,  MSH2, MSH3, MSH6, MUTYH, NBN, NF1, NHTL1, PALB2, PDGFRA, PMS2, POLD1, POLE, PTEN, RAD50, RAD51C, RAD51D, SDHA, SDHB, SDHC, SDHD, SMAD4, SMARCA4. STK11, TP53, TSC1, TSC2, and VHL.  The following genes were evaluated for sequence changes only: SDHA and HOXB13 c.251G>A variant only.es only: SDHA and HOXB13 c.251G>A variant only.   06/02/2020 Surgery   Right lumpectomy ([facility]): invasive and in situ ductal carcinoma, 1.3cm, clear margins, 1 right axillary lymph node negative for carcinoma.   06/02/2020 Cancer Staging   Staging form: Breast, AJCC 8th Edition - Pathologic stage from 06/02/2020: Stage IA (pT1c, pN0, cM0, G3, ER+, PR-, HER2+) - Signed by Gardenia Phlegm, NP on 06/15/2020 Stage prefix: Initial diagnosis Histologic grading system: 3 grade system    07/08/2020 - 10/21/2020 Chemotherapy   Taxol Herceptin followed by Herceptin maintenance   11/11/2020 -  Chemotherapy   Herceptin maintenance         CHIEF COMPLIANT: Herceptin maintenance  INTERVAL HISTORY: Pamela [name] is a 55 y.o. with above-mentioned history of right breast cancer treated with lumpectomy, and is currently on  Herceptin maintenance. She reports to the clinic today for treatment.   ALLERGIES:  is allergic to pollen extract, latex, codeine, shellfish allergy, and betadine [povidone iodine].  MEDICATIONS:  Current Outpatient Medications  Medication Sig Dispense Refill   amLODipine (NORVASC) 10 MG tablet Take 1 tablet (10 mg total) by mouth daily. 90 tablet 2   aspirin EC 81 MG tablet Take 1 tablet (81 mg total) by mouth daily. Swallow whole. 30 tablet 11   Azilsartan-Chlorthalidone (EDARBYCLOR) 40-25 MG TABS Take 1 tablet by mouth daily. 90 tablet 2   hydrOXYzine (ATARAX) 25 MG tablet Take 1 tablet by mouth every 12 (twelve)  hours as needed.     levonorgestrel (MIRENA, 52 MG,) 20 MCG/DAY IUD 1 each by Intrauterine route once.     lidocaine-prilocaine (EMLA) cream Apply 1 application topically once.      Multiple Vitamin (MULTIVITAMIN) LIQD Take 5 mLs by mouth daily.     rosuvastatin (CRESTOR) 40 MG tablet Take 1 tablet (40 mg total) by mouth daily. 90 tablet 3   VITAMIN D, CHOLECALCIFEROL, PO Take 1 tablet by mouth daily.     Current Facility-Administered Medications  Medication Dose Route Frequency Provider Last Rate Last Admin   0.9 %  sodium chloride infusion   Intravenous PRN Ghumman, Ramandeep, NP       Facility-Administered Medications Ordered in Other Visits  Medication Dose Route Frequency Provider Last Rate Last Admin   heparin lock flush 100 unit/mL  500 Units Intracatheter Once Nicholas Lose, MD       sodium chloride flush (NS) 0.9 % injection 10 mL  10 mL Intracatheter Once Nicholas Lose, MD        PHYSICAL EXAMINATION: ECOG PERFORMANCE STATUS: 1 - Symptomatic but completely ambulatory  Vitals:   03/03/21 0828  BP: (!) 145/70  Pulse: 69  Resp: 18  Temp: 97.6 F (36.4 C)  SpO2: 100%   Filed Weights   03/03/21 0828  Weight: (!) 317 lb 9 oz (144 kg)    LABORATORY DATA:  I have reviewed the data as listed CMP Latest Ref Rng & Units 02/13/2021 01/13/2021 12/23/2020  Glucose 70 - 99 mg/dL 107(H) 106(H) 116(H)  BUN 6 - 20 mg/dL 9 9 10   Creatinine 0.44 - 1.00 mg/dL 0.82 0.83 0.83  Sodium 135 - 145 mmol/L 142 143 139  Potassium 3.5 - 5.1 mmol/L 3.4(L) 3.5 3.2(L)  Chloride 98 - 111 mmol/L 105 104 99  CO2 22 - 32 mmol/L 27 29 32  Calcium 8.9 - 10.3 mg/dL 9.3 9.5 9.3  Total Protein 6.5 - 8.1 g/dL 7.1 7.1 7.4  Total Bilirubin 0.3 - 1.2 mg/dL 1.0 0.9 0.9  Alkaline Phos 38 - 126 U/L 61 67 63  AST 15 - 41 U/L 17 20 23   ALT 0 - 44 U/L 12 17 18     Lab Results  Component Value Date   WBC 5.7 03/03/2021   HGB 11.1 (L) 03/03/2021   HCT 35.4 (L) 03/03/2021   MCV 80.1 03/03/2021   PLT 222 03/03/2021   NEUTROABS 3.5 03/03/2021    ASSESSMENT & PLAN:  Malignant neoplasm of lower-outer quadrant of right breast of female, estrogen receptor positive  ([place]) 05/03/2020:Screening mammogram showed a 1.0cm mass at the 6 o'clock position in the right breast. Diagnostic mammogram and US showed the 1.2cm mass at the 6 o'clock position in the right breast. Biopsy showed IDC, grade 3, HER-2 positive (3+), ER 15% weak, PR-, Ki67 80%.   06/02/2020:Right lumpectomy ([facility]): invasive and in situ ductal carcinoma, 1.3cm, clear margins, 1 right axillary lymph node negative for carcinoma. ER 15% weak, PR-,HER-2 positive (3+) Ki67 80%.   Treatment plan: 1.  Adjuvant chemotherapy with Taxol Herceptin followed by Herceptin maintenance.  Stopped after 11 cycles of Taxol  2.  Adjuvant radiation therapy 11/23/2020-12/19/2020 3.  Adjuvant antiestrogen therapy ----------------------------------------------------------------------------------------------------- Current Treatment Herceptin maintenance ECHO 08/30/20: EF 60-65% Herceptin toxicities: None Chemotherapy-induced anemia: Monitoring hemoglobin today is 10.7. She will get the Mirena ring removed  FSH: 28, Estradiol 14.4: not in menopause  Tamoxifen Counseling: We discussed the risks and benefits of tamoxifen. These include but not limited to insomnia, hot  flashes, mood changes, vaginal dryness, and weight gain. Although rare, serious side effects including endometrial cancer, risk of blood clots were also discussed. We strongly believe that the benefits far outweigh the risks. Patient understands these risks and consented to starting treatment. Planned treatment duration is 10 years. We will discuss this at her next appointment.  Return to clinic every 3 weeks for Herceptin every 6 weeks for follow-up with me.    No orders of the defined types were placed in this encounter.  The patient has a good understanding of the overall plan. she agrees with it. she will call with any problems that may develop before the next visit here.  Total time spent: 30 mins including face to face time and time spent for  planning, charting and coordination of care  Rulon Eisenmenger, MD, MPH 03/03/2021  I, Thana Ates, am acting as scribe for Dr. Nicholas Lose.  I have reviewed the above documentation for accuracy and completeness, and I agree with the above.

## 2021-03-03 ENCOUNTER — Other Ambulatory Visit: Payer: Self-pay

## 2021-03-03 ENCOUNTER — Inpatient Hospital Stay: Payer: 59

## 2021-03-03 ENCOUNTER — Inpatient Hospital Stay (HOSPITAL_BASED_OUTPATIENT_CLINIC_OR_DEPARTMENT_OTHER): Payer: 59 | Admitting: Hematology and Oncology

## 2021-03-03 DIAGNOSIS — Z5112 Encounter for antineoplastic immunotherapy: Secondary | ICD-10-CM | POA: Diagnosis not present

## 2021-03-03 DIAGNOSIS — C50511 Malignant neoplasm of lower-outer quadrant of right female breast: Secondary | ICD-10-CM

## 2021-03-03 DIAGNOSIS — Z95828 Presence of other vascular implants and grafts: Secondary | ICD-10-CM

## 2021-03-03 DIAGNOSIS — Z170 Estrogen receptor positive status [ER+]: Secondary | ICD-10-CM

## 2021-03-03 LAB — CBC WITH DIFFERENTIAL (CANCER CENTER ONLY)
Abs Immature Granulocytes: 0.02 10*3/uL (ref 0.00–0.07)
Basophils Absolute: 0 10*3/uL (ref 0.0–0.1)
Basophils Relative: 1 %
Eosinophils Absolute: 0.3 10*3/uL (ref 0.0–0.5)
Eosinophils Relative: 5 %
HCT: 35.4 % — ABNORMAL LOW (ref 36.0–46.0)
Hemoglobin: 11.1 g/dL — ABNORMAL LOW (ref 12.0–15.0)
Immature Granulocytes: 0 %
Lymphocytes Relative: 22 %
Lymphs Abs: 1.2 10*3/uL (ref 0.7–4.0)
MCH: 25.1 pg — ABNORMAL LOW (ref 26.0–34.0)
MCHC: 31.4 g/dL (ref 30.0–36.0)
MCV: 80.1 fL (ref 80.0–100.0)
Monocytes Absolute: 0.7 10*3/uL (ref 0.1–1.0)
Monocytes Relative: 12 %
Neutro Abs: 3.5 10*3/uL (ref 1.7–7.7)
Neutrophils Relative %: 60 %
Platelet Count: 222 10*3/uL (ref 150–400)
RBC: 4.42 MIL/uL (ref 3.87–5.11)
RDW: 15 % (ref 11.5–15.5)
WBC Count: 5.7 10*3/uL (ref 4.0–10.5)
nRBC: 0 % (ref 0.0–0.2)

## 2021-03-03 LAB — CMP (CANCER CENTER ONLY)
ALT: 12 U/L (ref 0–44)
AST: 14 U/L — ABNORMAL LOW (ref 15–41)
Albumin: 4 g/dL (ref 3.5–5.0)
Alkaline Phosphatase: 56 U/L (ref 38–126)
Anion gap: 6 (ref 5–15)
BUN: 10 mg/dL (ref 6–20)
CO2: 32 mmol/L (ref 22–32)
Calcium: 9.2 mg/dL (ref 8.9–10.3)
Chloride: 103 mmol/L (ref 98–111)
Creatinine: 0.79 mg/dL (ref 0.44–1.00)
GFR, Estimated: >60 mL/min (ref >60)
Glucose, Bld: 106 mg/dL — ABNORMAL HIGH (ref 70–99)
Potassium: 3.3 mmol/L — ABNORMAL LOW (ref 3.5–5.1)
Sodium: 141 mmol/L (ref 135–145)
Total Bilirubin: 0.8 mg/dL (ref 0.3–1.2)
Total Protein: 7 g/dL (ref 6.5–8.1)

## 2021-03-03 MED ORDER — HEPARIN SOD (PORK) LOCK FLUSH 100 UNIT/ML IV SOLN
500.0000 [IU] | Freq: Once | INTRAVENOUS | Status: AC | PRN
  Administered 2021-03-03: 10:00:00 500 [IU]

## 2021-03-03 MED ORDER — HEPARIN SOD (PORK) LOCK FLUSH 100 UNIT/ML IV SOLN: 250.0000 [IU] | Freq: Once | INTRAVENOUS | Status: DC

## 2021-03-03 MED ORDER — TRASTUZUMAB-ANNS CHEMO 150 MG IV SOLR: 6.0000 mg/kg | Freq: Once | INTRAVENOUS | Status: DC

## 2021-03-03 MED ORDER — DIPHENHYDRAMINE HCL 50 MG/ML IJ SOLN
50.0000 mg | Freq: Once | INTRAMUSCULAR | Status: AC
  Administered 2021-03-03: 09:00:00 50 mg via INTRAVENOUS
  Filled 2021-03-03: qty 1

## 2021-03-03 MED ORDER — SODIUM CHLORIDE 0.9 % IV SOLN: Freq: Once | INTRAVENOUS | Status: AC

## 2021-03-03 MED ORDER — ACETAMINOPHEN 325 MG PO TABS
650.0000 mg | ORAL_TABLET | Freq: Once | ORAL | Status: AC
  Administered 2021-03-03: 09:00:00 650 mg via ORAL
  Filled 2021-03-03: qty 2

## 2021-03-03 MED ORDER — TRASTUZUMAB-ANNS CHEMO 150 MG IV SOLR
900.0000 mg | Freq: Once | INTRAVENOUS | Status: AC
  Administered 2021-03-03: 10:00:00 900 mg via INTRAVENOUS
  Filled 2021-03-03: qty 42.86

## 2021-03-03 MED ORDER — SODIUM CHLORIDE 0.9% FLUSH
10.0000 mL | INTRAVENOUS | Status: DC | PRN
  Administered 2021-03-03: 10:00:00 10 mL

## 2021-03-03 MED ORDER — SODIUM CHLORIDE 0.9% FLUSH
10.0000 mL | Freq: Once | INTRAVENOUS | Status: AC
  Administered 2021-03-03: 08:00:00 10 mL

## 2021-03-03 NOTE — Patient Instructions (Signed)
[facility]  Discharge Instructions: ?Thank you for choosing [facility] to provide your oncology and hematology care.  ? ?If you have a lab appointment with the Cancer Center, please go directly to the Cancer Center and check in at the registration area. ?  ?Wear comfortable clothing and clothing appropriate for easy access to any Portacath or PICC line.  ? ?We strive to give you quality time with your provider. You may need to reschedule your appointment if you arrive late (15 or more minutes).  Arriving late affects you and other patients whose appointments are after yours.  Also, if you miss three or more appointments without notifying the office, you may be dismissed from the clinic at the provider?s discretion.    ?  ?For prescription refill requests, have your pharmacy contact our office and allow 72 hours for refills to be completed.   ? ?Today you received the following chemotherapy and/or immunotherapy agents: Trastuzumab    ?  ?To help prevent nausea and vomiting after your treatment, we encourage you to take your nausea medication as directed. ? ?BELOW ARE SYMPTOMS THAT SHOULD BE REPORTED IMMEDIATELY: ?*FEVER GREATER THAN 100.4 F (38 ?C) OR HIGHER ?*CHILLS OR SWEATING ?*NAUSEA AND VOMITING THAT IS NOT CONTROLLED WITH YOUR NAUSEA MEDICATION ?*UNUSUAL SHORTNESS OF BREATH ?*UNUSUAL BRUISING OR BLEEDING ?*URINARY PROBLEMS (pain or burning when urinating, or frequent urination) ?*BOWEL PROBLEMS (unusual diarrhea, constipation, pain near the anus) ?TENDERNESS IN MOUTH AND THROAT WITH OR WITHOUT PRESENCE OF ULCERS (sore throat, sores in mouth, or a toothache) ?UNUSUAL RASH, SWELLING OR PAIN  ?UNUSUAL VAGINAL DISCHARGE OR ITCHING  ? ?Items with * indicate a potential emergency and should be followed up as soon as possible or go to the Emergency Department if any problems should occur. ? ?Please show the CHEMOTHERAPY ALERT CARD or IMMUNOTHERAPY ALERT CARD at check-in  to the Emergency Department and triage nurse. ? ?Should you have questions after your visit or need to cancel or reschedule your appointment, please contact [facility]  Dept: [phone]  and follow the prompts.  Office hours are 8:00 a.m. to 4:30 p.m. Monday - Friday. Please note that voicemails left after 4:00 p.m. may not be returned until the following business day.  We are closed weekends and major holidays. You have access to a nurse at all times for urgent questions. Please call the main number to the clinic Dept: [phone] and follow the prompts. ? ? ?For any non-urgent questions, you may also contact your provider using MyChart. We now offer e-Visits for anyone 18 and older to request care online for non-urgent symptoms. For details visit [email or web address]. ?  ?Also download the MyChart app! Go to the app store, search "MyChart", open the app, select , and log in with your MyChart username and password. ? ?Due to Covid, a mask is required upon entering the hospital/clinic. If you do not have a mask, one will be given to you upon arrival. For doctor visits, patients may have 1 support person aged 18 or older with them. For treatment visits, patients cannot have anyone with them due to current Covid guidelines and our immunocompromised population.  ? ?

## 2021-03-03 NOTE — Assessment & Plan Note (Signed)
05/03/2020:Screening mammogram showed a 1.0cm mass at the 6 o'clock position in the right breast. Diagnostic mammogram and US showed the 1.2cm mass at the 6 o'clock position in the right breast. Biopsy showed IDC, grade 3, HER-2 positive (3+), ER 15% weak, PR-, Ki67 80%.  06/02/2020:Right lumpectomy ([facility]): invasive and in situ ductal carcinoma, 1.3cm, clear margins, 1 right axillary lymph node negative for carcinoma. ER 15% weak, PR-,HER-2 positive (3+) Ki67 80%.  Treatment plan: 1.Adjuvant chemotherapy with Taxol Herceptinfollowed byHerceptin maintenance.Stopped after 11 cycles of Taxol 2.Adjuvant radiation therapy9/21/2022-12/19/2020 3.Adjuvant antiestrogen therapy ----------------------------------------------------------------------------------------------------- CurrentTreatmentHerceptinmaintenance ECHO6/28/22: EF 60-65% Herceptin toxicities: None Chemotherapy-induced anemia: Monitoring hemoglobin today is 10.7. She will get the Mirena ring removed  FSH: 28, Estradiol 14.4: not in menopause  Tamoxifen Counseling: We discussed the risks and benefits of tamoxifen. These include but not limited to insomnia, hot flashes, mood changes, vaginal dryness, and weight gain. Although rare, serious side effects including endometrial cancer, risk of blood clots were also discussed. We strongly believe that the benefits far outweigh the risks. Patient understands these risks and consented to starting treatment. Planned treatment duration is 10 years.  Return to clinic every 3 weeks for Herceptin every 6 weeks for follow-up with me.

## 2021-03-09 ENCOUNTER — Other Ambulatory Visit: Payer: Self-pay

## 2021-03-09 ENCOUNTER — Ambulatory Visit (INDEPENDENT_AMBULATORY_CARE_PROVIDER_SITE_OTHER): Payer: 59 | Admitting: Bariatrics

## 2021-03-09 ENCOUNTER — Encounter (INDEPENDENT_AMBULATORY_CARE_PROVIDER_SITE_OTHER): Payer: Self-pay | Admitting: Bariatrics

## 2021-03-09 VITALS — BP 146/83 | HR 67 | Temp 98.5°F | Ht 69.0 in | Wt 314.0 lb

## 2021-03-09 DIAGNOSIS — I10 Essential (primary) hypertension: Secondary | ICD-10-CM

## 2021-03-09 DIAGNOSIS — Z1331 Encounter for screening for depression: Secondary | ICD-10-CM | POA: Diagnosis not present

## 2021-03-09 DIAGNOSIS — R5383 Other fatigue: Secondary | ICD-10-CM | POA: Diagnosis not present

## 2021-03-09 DIAGNOSIS — Z6841 Body Mass Index (BMI) 40.0 and over, adult: Secondary | ICD-10-CM

## 2021-03-09 DIAGNOSIS — R0602 Shortness of breath: Secondary | ICD-10-CM | POA: Diagnosis not present

## 2021-03-09 DIAGNOSIS — R7309 Other abnormal glucose: Secondary | ICD-10-CM

## 2021-03-09 DIAGNOSIS — E559 Vitamin D deficiency, unspecified: Secondary | ICD-10-CM

## 2021-03-09 DIAGNOSIS — G4733 Obstructive sleep apnea (adult) (pediatric): Secondary | ICD-10-CM

## 2021-03-09 DIAGNOSIS — E7800 Pure hypercholesterolemia, unspecified: Secondary | ICD-10-CM

## 2021-03-09 DIAGNOSIS — J4520 Mild intermittent asthma, uncomplicated: Secondary | ICD-10-CM

## 2021-03-09 DIAGNOSIS — E66813 Obesity, class 3: Secondary | ICD-10-CM

## 2021-03-09 NOTE — Progress Notes (Signed)
Dear Dr. Gwenlyn Found,   Thank you for referring Pamela [name] to our clinic. The following note includes my evaluation and treatment recommendations.  Chief Complaint:   OBESITY Pamela [name] (MR# [id number]) is a 56 y.o. female who presents for evaluation and treatment of obesity and related comorbidities. Current BMI is Body mass index is 46.37 kg/m. Pamela [name] has been struggling with her weight for many years and has been unsuccessful in either losing weight, maintaining weight loss, or reaching her healthy weight goal.  Pamela [name] is currently in the action stage of change and ready to dedicate time achieving and maintaining a healthier weight. Pamela [name] is interested in becoming our patient and working on intensive lifestyle modifications including (but not limited to) diet and exercise for weight loss.  Pamela [name] does not necessarily like to cook.  She states that she craves sweets.  She sometimes skips meals.  Pamela [name]'s habits were reviewed today and are as follows: Her family eats meals together, she thinks her family will eat healthier with her, her desired weight loss is 130 pounds, she has been heavy most of her life, she started gaining weight in college, her heaviest weight ever was 334 pounds, she craves sweets, she snacks frequently in the evenings, she skips breakfast and/or lunch frequently, she is frequently drinking liquids with calories, she frequently makes poor food choices, she frequently eats larger portions than normal, and she struggles with emotional eating.  Depression Screen Pamela [name]'s Food and Mood (modified PHQ-9) score was 6.  Depression screen PHQ 2/9 03/09/2021  Decreased Interest 1  Down, Depressed, Hopeless 0  PHQ - 2 Score 1  Altered sleeping 1  Tired, decreased energy 2  Change in appetite 1  Feeling bad or failure about yourself  0  Trouble concentrating 1  Moving slowly or fidgety/restless 0  Suicidal thoughts 0  PHQ-9 Score 6   Difficult doing work/chores Not difficult at all  Some recent data might be hidden   Subjective:   1. Other fatigue Pamela [name] denies daytime somnolence and reports waking up still tired. Patent has a history of symptoms of morning fatigue and snoring. Pamela [name] generally gets 5 hours of sleep per night, and states that she has poor sleep quality. Snoring is present. Apneic episodes are not present. Epworth Sleepiness Score is 4.  Occurs with certain activities.  2. SOB (shortness of breath) on exertion Pamela [name] notes increasing shortness of breath with exercising and seems to be worsening over time with weight gain. She notes getting out of breath sooner with activity than she used to. This has gotten worse recently. Pamela [name] denies shortness of breath at rest or orthopnea.  Occurs with certain activities.  3. Essential hypertension Taking Norvasc, Edarbyclor.  Reasonably well controlled.  Review: taking medications as instructed, no medication side effects noted, no chest pain on exertion, no dyspnea on exertion, no swelling of ankles.   BP Readings from Last 3 Encounters:  03/09/21 (!) 146/83  03/03/21 (!) 145/70  02/17/21 (!) 150/60   4. High cholesterol Pamela [name] has hyperlipidemia and has been trying to improve her cholesterol levels with intensive lifestyle modification including a low saturated fat diet, exercise and weight loss. She denies any chest pain, claudication or myalgias.  She is taking rosuvastatin.  Lab Results  Component Value Date   ALT 12 03/03/2021   AST 14 (L) 03/03/2021   ALKPHOS 56 03/03/2021   BILITOT 0.8 03/03/2021   Lab Results  Component Value Date   CHOL 198 06/21/2020  HDL 47 06/21/2020   LDLCALC 129 (H) 06/21/2020   TRIG 120 06/21/2020   CHOLHDL 4.2 06/21/2020   5. Mild intermittent asthma, unspecified whether complicated She uses albuterol occasionally.  6. Vitamin D deficiency She is taking OTC vitamin D.  Lab Results  Component  Value Date   VD25OH 18.6 (L) 06/17/2019   7. Elevated glucose No medications.   8. OSA (obstructive sleep apnea) Pamela [name] is not using a CPAP machine.  9. Depression screen Pamela [name] was screened for depression as part of her new patient workup today.  PHQ-9 is 6.  Assessment/Plan:   1. Other fatigue Pamela [name] does feel that her weight is causing her energy to be lower than it should be. Fatigue may be related to obesity, depression or many other causes. Labs will be ordered, and in the meanwhile, Pamela [name] will focus on self care including making healthy food choices, increasing physical activity and focusing on stress reduction.  Gradually increase activities/exercise.  - TSH+T4F+T3Free  2. SOB (shortness of breath) on exertion Pamela [name] does feel that she gets out of breath more easily that she used to when she exercises. Pamela [name]'s shortness of breath appears to be obesity related and exercise induced. She has agreed to work on weight loss and gradually increase exercise to treat her exercise induced shortness of breath. Will continue to monitor closely.  Gradually increase activities/exercise.  - TSH+T4F+T3Free  3. Essential hypertension Continue medications.  4. High cholesterol Check lipid panel today.  - Lipid Panel With LDL/HDL Ratio  5. Mild intermittent asthma, unspecified whether complicated Use albuterol as needed.  6. Vitamin D deficiency Continue vitamin D.  Check vitamin D level today.  - VITAMIN D 25 Hydroxy (Vit-D Deficiency, Fractures)  7. Elevated glucose Check A1c and insulin level.  - Hemoglobin A1c - Insulin, random  8. OSA (obstructive sleep apnea) Discussed the importance of restorative sleep.  9. Depression screen Pamela [name] had a positive depression screening. Depression is commonly associated with obesity and often results in emotional eating behaviors. We will monitor this closely and work on CBT to help improve the non-hunger eating  patterns. Referral to Psychology may be required if no improvement is seen as she continues in our clinic.  10. Class 3 severe obesity with serious comorbidity and body mass index (BMI) of 45.0 to 49.9 in adult, unspecified obesity type (HCC)  Pamela [name] is currently in the action stage of change and her goal is to continue with weight loss efforts. I recommend Ayde begin the structured treatment plan as follows:  She has agreed to the [employer] +300-400 calories.  She will work on eliminating sugary drinks and minimizing skipping meals.  Handout provided:  [place].  Reviewed labs with patient from 03/03/2021, including CMP, CBC, glucose.  Exercise goals: No exercise has been prescribed at this time.   Behavioral modification strategies: increasing lean protein intake, decreasing simple carbohydrates, increasing vegetables, increasing water intake, decreasing eating out, no skipping meals, meal planning and cooking strategies, keeping healthy foods in the home, and planning for success.  She was informed of the importance of frequent follow-up visits to maximize her success with intensive lifestyle modifications for her multiple health conditions. She was informed we would discuss her lab results at her next visit unless there is a critical issue that needs to be addressed sooner. Margorie agreed to keep her next visit at the agreed upon time to discuss these results.  Objective:   Blood pressure (!) 146/83, pulse 67, temperature 98.5 F (  36.9 C), height 5\' 9"  (1.753 m), weight (!) 314 lb (142.4 kg), SpO2 99 %. Body mass index is 46.37 kg/m.  Indirect Calorimeter completed today shows a VO2 of 297 and a REE of 2045.  Her calculated basal metabolic rate is 1683 thus her basal metabolic rate is worse than expected.  General: Cooperative, alert, well developed, in no acute distress. HEENT: Conjunctivae and lids unremarkable. Cardiovascular: Regular rhythm.  Lungs: Normal work  of breathing. Neurologic: No focal deficits.   Lab Results  Component Value Date   CREATININE 0.79 03/03/2021   BUN 10 03/03/2021   NA 141 03/03/2021   K 3.3 (L) 03/03/2021   CL 103 03/03/2021   CO2 32 03/03/2021   Lab Results  Component Value Date   ALT 12 03/03/2021   AST 14 (L) 03/03/2021   ALKPHOS 56 03/03/2021   BILITOT 0.8 03/03/2021   Lab Results  Component Value Date   HGBA1C 6.1 (H) 06/21/2020   HGBA1C 5.5 06/12/2018   Lab Results  Component Value Date   INSULIN 56.0 (H) 06/21/2020   INSULIN 16.1 06/17/2019   Lab Results  Component Value Date   TSH 1.205 12/23/2020   Lab Results  Component Value Date   CHOL 198 06/21/2020   HDL 47 06/21/2020   LDLCALC 129 (H) 06/21/2020   TRIG 120 06/21/2020   CHOLHDL 4.2 06/21/2020   Lab Results  Component Value Date   WBC 5.7 03/03/2021   HGB 11.1 (L) 03/03/2021   HCT 35.4 (L) 03/03/2021   MCV 80.1 03/03/2021   PLT 222 03/03/2021   Attestation Statements:   Reviewed by clinician on day of visit: allergies, medications, problem list, medical history, surgical history, family history, social history, and previous encounter notes.  I, Water quality scientist, CMA, am acting as Location manager for [employer], DO  I have reviewed the above documentation for accuracy and completeness, and I agree with the above. Jearld Lesch, DO

## 2021-03-10 LAB — VITAMIN D 25 HYDROXY (VIT D DEFICIENCY, FRACTURES): Vit D, 25-Hydroxy: 38.5 ng/mL (ref 30.0–100.0)

## 2021-03-10 LAB — LIPID PANEL WITH LDL/HDL RATIO
Cholesterol, Total: 228 mg/dL — ABNORMAL HIGH (ref 100–199)
HDL: 48 mg/dL (ref >39)
LDL Chol Calc (NIH): 160 mg/dL — ABNORMAL HIGH (ref 0–99)
LDL/HDL Ratio: 3.3 ratio — ABNORMAL HIGH (ref 0.0–3.2)
Triglycerides: 111 mg/dL (ref 0–149)
VLDL Cholesterol Cal: 20 mg/dL (ref 5–40)

## 2021-03-10 LAB — TSH+T4F+T3FREE
Free T4: 1.34 ng/dL (ref 0.82–1.77)
T3, Free: 3.2 pg/mL (ref 2.0–4.4)
TSH: 1.09 u[IU]/mL (ref 0.450–4.500)

## 2021-03-10 LAB — INSULIN, RANDOM: INSULIN: 25.7 u[IU]/mL — ABNORMAL HIGH (ref 2.6–24.9)

## 2021-03-10 LAB — HEMOGLOBIN A1C
Est. average glucose Bld gHb Est-mCnc: 126 mg/dL
Hgb A1c MFr Bld: 6 % — ABNORMAL HIGH (ref 4.8–5.6)

## 2021-03-13 ENCOUNTER — Encounter (INDEPENDENT_AMBULATORY_CARE_PROVIDER_SITE_OTHER): Payer: Self-pay | Admitting: Bariatrics

## 2021-03-13 DIAGNOSIS — R7303 Prediabetes: Secondary | ICD-10-CM | POA: Insufficient documentation

## 2021-03-15 LAB — HM PAP SMEAR: HM Pap smear: ABNORMAL

## 2021-03-16 ENCOUNTER — Telehealth: Payer: Self-pay | Admitting: Hematology and Oncology

## 2021-03-16 ENCOUNTER — Encounter: Payer: Self-pay | Admitting: Internal Medicine

## 2021-03-16 NOTE — Telephone Encounter (Signed)
Sch per 1/12 inbasket, unable to leave msg

## 2021-03-17 ENCOUNTER — Ambulatory Visit: Payer: 59

## 2021-03-17 ENCOUNTER — Inpatient Hospital Stay: Payer: 59 | Attending: Hematology and Oncology

## 2021-03-17 ENCOUNTER — Other Ambulatory Visit: Payer: Self-pay

## 2021-03-17 ENCOUNTER — Other Ambulatory Visit: Payer: 59

## 2021-03-17 VITALS — BP 146/75 | HR 73 | Temp 98.9°F | Resp 20

## 2021-03-17 DIAGNOSIS — C50511 Malignant neoplasm of lower-outer quadrant of right female breast: Secondary | ICD-10-CM | POA: Diagnosis present

## 2021-03-17 DIAGNOSIS — Z801 Family history of malignant neoplasm of trachea, bronchus and lung: Secondary | ICD-10-CM | POA: Insufficient documentation

## 2021-03-17 DIAGNOSIS — R7303 Prediabetes: Secondary | ICD-10-CM | POA: Diagnosis not present

## 2021-03-17 DIAGNOSIS — D6481 Anemia due to antineoplastic chemotherapy: Secondary | ICD-10-CM | POA: Diagnosis not present

## 2021-03-17 DIAGNOSIS — Z803 Family history of malignant neoplasm of breast: Secondary | ICD-10-CM | POA: Insufficient documentation

## 2021-03-17 DIAGNOSIS — Z79899 Other long term (current) drug therapy: Secondary | ICD-10-CM | POA: Insufficient documentation

## 2021-03-17 DIAGNOSIS — Z8249 Family history of ischemic heart disease and other diseases of the circulatory system: Secondary | ICD-10-CM | POA: Diagnosis not present

## 2021-03-17 DIAGNOSIS — Z836 Family history of other diseases of the respiratory system: Secondary | ICD-10-CM | POA: Diagnosis not present

## 2021-03-17 DIAGNOSIS — I10 Essential (primary) hypertension: Secondary | ICD-10-CM | POA: Insufficient documentation

## 2021-03-17 DIAGNOSIS — Z833 Family history of diabetes mellitus: Secondary | ICD-10-CM | POA: Insufficient documentation

## 2021-03-17 DIAGNOSIS — T451X5A Adverse effect of antineoplastic and immunosuppressive drugs, initial encounter: Secondary | ICD-10-CM | POA: Diagnosis not present

## 2021-03-17 DIAGNOSIS — Z8349 Family history of other endocrine, nutritional and metabolic diseases: Secondary | ICD-10-CM | POA: Diagnosis not present

## 2021-03-17 DIAGNOSIS — Z95828 Presence of other vascular implants and grafts: Secondary | ICD-10-CM

## 2021-03-17 DIAGNOSIS — Z885 Allergy status to narcotic agent status: Secondary | ICD-10-CM | POA: Diagnosis not present

## 2021-03-17 DIAGNOSIS — Z170 Estrogen receptor positive status [ER+]: Secondary | ICD-10-CM | POA: Diagnosis not present

## 2021-03-17 DIAGNOSIS — Z5112 Encounter for antineoplastic immunotherapy: Secondary | ICD-10-CM | POA: Insufficient documentation

## 2021-03-17 DIAGNOSIS — Z6841 Body Mass Index (BMI) 40.0 and over, adult: Secondary | ICD-10-CM | POA: Diagnosis not present

## 2021-03-17 MED ORDER — CYANOCOBALAMIN 1000 MCG/ML IJ SOLN
1000.0000 ug | Freq: Once | INTRAMUSCULAR | Status: AC
  Administered 2021-03-17: 1000 ug via INTRAMUSCULAR
  Filled 2021-03-17: qty 1

## 2021-03-21 ENCOUNTER — Ambulatory Visit (HOSPITAL_BASED_OUTPATIENT_CLINIC_OR_DEPARTMENT_OTHER)
Admission: RE | Admit: 2021-03-21 | Discharge: 2021-03-21 | Disposition: A | Discharge status: Home / Self Care | Payer: 59 | Source: Ambulatory Visit

## 2021-03-21 ENCOUNTER — Encounter (HOSPITAL_COMMUNITY): Payer: Self-pay | Admitting: Internal Medicine

## 2021-03-21 ENCOUNTER — Ambulatory Visit (HOSPITAL_COMMUNITY)
Admission: RE | Admit: 2021-03-21 | Discharge: 2021-03-21 | Disposition: A | Discharge status: Home / Self Care | Payer: 59 | Source: Ambulatory Visit | Attending: Internal Medicine | Admitting: Internal Medicine

## 2021-03-21 ENCOUNTER — Other Ambulatory Visit: Payer: Self-pay

## 2021-03-21 VITALS — BP 122/78 | HR 64 | Wt 318.4 lb

## 2021-03-21 DIAGNOSIS — Z6841 Body Mass Index (BMI) 40.0 and over, adult: Secondary | ICD-10-CM

## 2021-03-21 DIAGNOSIS — Z170 Estrogen receptor positive status [ER+]: Secondary | ICD-10-CM | POA: Insufficient documentation

## 2021-03-21 DIAGNOSIS — Z0189 Encounter for other specified special examinations: Secondary | ICD-10-CM

## 2021-03-21 DIAGNOSIS — Z01818 Encounter for other preprocedural examination: Secondary | ICD-10-CM | POA: Insufficient documentation

## 2021-03-21 DIAGNOSIS — C50511 Malignant neoplasm of lower-outer quadrant of right female breast: Secondary | ICD-10-CM

## 2021-03-21 DIAGNOSIS — R0683 Snoring: Secondary | ICD-10-CM | POA: Diagnosis not present

## 2021-03-21 DIAGNOSIS — R0600 Dyspnea, unspecified: Secondary | ICD-10-CM | POA: Insufficient documentation

## 2021-03-21 DIAGNOSIS — I10 Essential (primary) hypertension: Secondary | ICD-10-CM | POA: Diagnosis not present

## 2021-03-21 DIAGNOSIS — I119 Hypertensive heart disease without heart failure: Secondary | ICD-10-CM | POA: Insufficient documentation

## 2021-03-21 DIAGNOSIS — G4733 Obstructive sleep apnea (adult) (pediatric): Secondary | ICD-10-CM | POA: Diagnosis not present

## 2021-03-21 LAB — ECHOCARDIOGRAM COMPLETE
AR max vel: 2.43 cm2
AV Area VTI: 2.38 cm2
AV Area mean vel: 2.31 cm2
AV Mean grad: 5 mmHg
AV Peak grad: 9.1 mmHg
Ao pk vel: 1.51 m/s
Area-P 1/2: 3.24 cm2
S' Lateral: 3.5 cm

## 2021-03-21 NOTE — Progress Notes (Addendum)
CARDIO-ONCOLOGY CLINIC NOTE  Referring Physician: Dr. Lindi Adie Primary Care: [name], MD Primary Cardiologist: Dr. Gwenlyn Found  HPI:  Ms. Jolliff is 56 y.o. female with obesity, HTN, HL and right breast cancer  diagnosed referred by Dr. Lindi Adie  for enrollment into the Cardio-Oncology program.   Malignant neoplasm of lower-outer quadrant of right breast of female, estrogen receptor positive ([place])  05/03/2020 Initial Diagnosis    Screening mammogram showed a 1.0cm mass at the 6 o'clock position in the right breast. Diagnostic mammogram and US showed the 1.2cm mass at the 6 o'clock position in the right breast. Biopsy showed IDC, grade 3, HER-2 positive (3+), ER 15% weak, PR-, Ki67 80%.    05/11/2020 Cancer Staging    Staging form: Breast, AJCC 8th Edition - Clinical stage from 05/11/2020: Stage IA (cT1b, cN0, cM0, G3, ER+, PR-, HER2+) - Signed by Nicholas Lose, MD on 05/11/2020 Stage prefix: Initial diagnosis    05/19/2020 Genetic Testing    Negative hereditary cancer genetic testing: no pathogenic variants detected in [facility] Breast STAT Panel and Common Hereditary Cancers Panel.  The report dates are May 19, 2020 (STAT) and May 23, 2020 (Common Hereditary).    The Common Hereditary Cancers Panel offered by [facility] includes sequencing and/or deletion duplication testing of the following 47 genes: APC, ATM, AXIN2, BARD1, BMPR1A, BRCA1, BRCA2, BRIP1, CDH1, CDK4, CDKN2A (p14ARF), CDKN2A (p16INK4a), CHEK2, CTNNA1, DICER1, EPCAM (Deletion/duplication testing only), GREM1 (promoter region deletion/duplication testing only), GREM1, HOXB13, KIT, MEN1, MLH1, MSH2, MSH3, MSH6, MUTYH, NBN, NF1, NHTL1, PALB2, PDGFRA, PMS2, POLD1, POLE, PTEN, RAD50, RAD51C, RAD51D, SDHA, SDHB, SDHC, SDHD, SMAD4, SMARCA4. STK11, TP53, TSC1, TSC2, and VHL.  The following genes were evaluated for sequence changes only: SDHA and HOXB13 c.251G>A variant only.es only: SDHA and HOXB13 c.251G>A variant only.    06/02/2020 Surgery     Right lumpectomy ([place]): invasive and in situ ductal carcinoma, 1.3cm, clear margins, 1 right axillary lymph node negative for carcinoma.    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 Denies known heart disease.  Diagnosed with HER-2+ breast CA in 3/22. Treated with lumpectomy in 3/22. Then Taxol/Herceptin 12 rounds from 5/22-8/22. Now on Herceptin q 3 weeks. Finished XRT in 10/22.   Doing well. Tolerating Herceptin well. Mild DOE. No edema, orthopnea or PND. + snoring. Has scheduled sleep study but didn't get device. Daytime somnolence. Recently started weight loss clinic  Echo today  03/21/20 Echo 55-60%  Mod LVH G2DD GLS 15.0% Personally reviewed   Echo 05/20/20: EF 60-65% GLS -21.2% Echo 08/30/20: EF 60-65% GLS - 20.7% Echo 10/22: EF 60-65% GLS reported as -13.3%    Past Medical History:  Diagnosis Date   Anxiety    Asthma    Back pain    Breast cancer ([place])    Constipation    DUB (dysfunctional uterine bleeding) 2007   Edema of both lower extremities    Elevated cholesterol    Family history of breast cancer 05/12/2020   Family history of lung cancer  05/12/2020   Food allergy    H/O menorrhagia 05/01/2006   High cholesterol    History of ovarian cyst 2007   Hypertension 03/31/2005   Increased BMI 07/11/2006   Joint pain    Migraine    N&V (nausea and vomiting)    Obesity 2007   Obstructive sleep apnea syndrome, moderate 10/27/2013   no cpap   Sleep apnea    SOB (shortness of breath)    Vitamin D deficiency    Vitamin D  deficiency    Weight loss 07/11/2006    Current Outpatient Medications  Medication Sig Dispense Refill   amLODipine (NORVASC) 10 MG tablet Take 1 tablet (10 mg total) by mouth daily. 90 tablet 2   aspirin EC 81 MG tablet Take 1 tablet (81 mg total) by mouth daily. Swallow whole. 30 tablet 11   Azilsartan-Chlorthalidone (EDARBYCLOR) 40-25 MG TABS Take 1 tablet by mouth daily. 90 tablet 2   hydrOXYzine (ATARAX) 25 MG tablet Take 1 tablet by mouth every 12 (twelve) hours as needed.     levonorgestrel (MIRENA, 52 MG,) 20 MCG/DAY IUD 1 each by Intrauterine route once.     lidocaine-prilocaine (EMLA) cream Apply 1 application topically once.     Multiple Vitamin (MULTIVITAMIN) LIQD Take 5 mLs by mouth daily.     rosuvastatin (CRESTOR) 40 MG tablet Take 1 tablet (40 mg total) by mouth daily. 90 tablet 3   VITAMIN D, CHOLECALCIFEROL, PO Take 1 tablet by mouth daily.     Current Facility-Administered Medications  Medication Dose Route Frequency Provider Last Rate Last Admin   0.9 %  sodium chloride infusion   Intravenous PRN Ghumman, Ramandeep, NP       Facility-Administered Medications Ordered in Other Encounters  Medication Dose Route Frequency Provider Last Rate Last Admin   heparin lock flush 100 unit/mL  500 Units Intracatheter Once Nicholas Lose, MD       sodium chloride flush (NS) 0.9 % injection 10 mL  10 mL Intracatheter Once Nicholas Lose, MD        Allergies  Allergen Reactions   Pollen Extract Shortness Of Breath   Latex Rash   Codeine Hives   Shellfish Allergy Hives   Betadine [Povidone Iodine] Rash      Social History   Socioeconomic History   Marital status: Single    Spouse name: Not on file   Number of children: Not on file   Years of education: Not on file   Highest education level: Not on file  Occupational History   Occupation: Education officer, museum  Tobacco Use   Smoking status: Never   Smokeless tobacco: Never  Vaping Use   Vaping Use: Never used  Substance and  Sexual Activity   Alcohol use: No   Drug use: Not Currently    Frequency: 2.0 times per week   Sexual activity: Not Currently    Birth control/protection: I.U.D.    Comment: Mirena  Other Topics Concern   Not on file  Social History Narrative   Not on file   Social Determinants of Health   Financial Resource Strain: Not on file  Food Insecurity: Not on file  Transportation Needs: Not on file  Physical Activity: Not on file  Stress: Not on file  Social Connections: Not on file  Intimate Partner Violence: Not on file      Family History  Problem Relation Age of Onset   Hypertension Mother    High Cholesterol Mother    Thyroid disease  Mother    Cancer Mother    Sleep apnea Mother    Hypertension Father    Heart attack Father    Sudden death Father    Diabetes Maternal Grandmother    Bone cancer Maternal Grandfather        dx after 49   Breast cancer Maternal Aunt        dx 84s   Lung cancer Maternal Aunt        dx after 50    Vitals:   03/21/21 1108  BP: 122/78  Pulse: 64  SpO2: 98%  Weight: (!) 144.4 kg (318 lb 6.4 oz)    PHYSICAL EXAM: General:  Well appearing. No resp difficulty HEENT: normal Neck: supple. no JVD. Carotids 2+ bilat; no bruits. No lymphadenopathy or thryomegaly appreciated. Cor: PMI nondisplaced. Regular rate & rhythm. No rubs, gallops or murmurs. Lungs: clear Abdomen: obese soft, nontender, nondistended. No hepatosplenomegaly. No bruits or masses. Good bowel sounds. Extremities: no cyanosis, clubbing, rash, edema Neuro: alert & orientedx3, cranial nerves grossly intact. moves all 4 extremities w/o difficulty. Affect pleasant    ASSESSMENT & PLAN:  1. Right Breast Cancer - HER2 + - Treated with lumpectomy in 3/22. Then Taxol/Herceptin 12 rounds from 5/22-8/22. Now on Herceptin q 3 weeks. Also about to finish XRT.  - Echo 10/22 EF 60-65% GLS reported as -13.3%. I have reviewed this and reading is inaccurate due to poor endocardial  tracking Echo today  03/21/20 Echo 55-60%  Mod LVH G2DD GLS 15.0% Personally reviewed - EF stable (perhaps slightly less vigorous than less echo but still well within normal range). Continue Herceptin. Repeat echo in 3 months.  - Completes Herceptin 5/23  2. HTN with LVH and G2DD on echo  - Blood pressure well controlled. Continue current regimen. - check sleep study  3. Snoring/daytime somnolence - check sleep study  4. Morbid obesity - has enrolled in weight loss clinic - consider GLP-1RA   Glori Bickers, MD  11:17 AM

## 2021-03-21 NOTE — Patient Instructions (Signed)
No Changes in your medication.  Your provider has recommended that you have a home sleep study.  We have provided you with the equipment in our office today. Please download the app and follow the instructions. YOUR PIN NUMBER IS: [id number]. Once you have completed the test you just dispose of the equipment, the information is automatically uploaded to [place] via blue-tooth technology. If your test is positive for sleep apnea and you need a home CPAP machine you will be contacted by Dr Theodosia Blender office [facility]) to set this up.   Your physician has requested that you have an echocardiogram. Echocardiography is a painless test that uses sound waves to create images of your heart. It provides your doctor with information about the size and shape of your heart and how well your hearts chambers and valves are working. This procedure takes approximately one hour. There are no restrictions for this procedure.  Your physician recommends that you schedule a follow-up appointment in: 3 months  If you have any questions or concerns before your next appointment please send [place] a message through [place] or call our office at [phone].    TO LEAVE A MESSAGE FOR THE NURSE SELECT OPTION 2, PLEASE LEAVE A MESSAGE INCLUDING: YOUR NAME DATE OF BIRTH CALL BACK NUMBER REASON FOR CALL**this is important as we prioritize the call backs  YOU WILL RECEIVE A CALL BACK THE SAME DAY AS LONG AS YOU CALL BEFORE 4:00 PM  At the [facility], you and your health needs are our priority. As part of our continuing mission to provide you with exceptional heart care, we have created designated Provider Care Teams. These Care Teams include your primary Cardiologist (physician) and Advanced Practice Providers (APPs- Physician Assistants and Nurse Practitioners) who all work together to provide you with the care you need, when you need it.   You may see any of the following providers on your designated Care Team at  your next follow up: Dr Glori Bickers Dr Haynes Kerns, NP Lyda Jester, [place] [facility] [place], [place] Audry Riles, PharmD   Please be sure to bring in all your medications bottles to every appointment.

## 2021-03-21 NOTE — Progress Notes (Signed)
°  Echocardiogram 2D Echocardiogram has been performed.  Pamela [name] 03/21/2021, 10:57 AM

## 2021-03-21 NOTE — Addendum Note (Signed)
Encounter addended by: Jerl Mina, RN on: 03/21/2021 11:47 AM  Actions taken: Order list changed, Diagnosis association updated, Clinical Note Signed

## 2021-03-22 ENCOUNTER — Ambulatory Visit (INDEPENDENT_AMBULATORY_CARE_PROVIDER_SITE_OTHER): Payer: 59 | Admitting: Bariatrics

## 2021-03-23 ENCOUNTER — Telehealth (HOSPITAL_COMMUNITY): Payer: Self-pay | Admitting: Surgery

## 2021-03-23 ENCOUNTER — Ambulatory Visit (INDEPENDENT_AMBULATORY_CARE_PROVIDER_SITE_OTHER): Payer: 59 | Admitting: Bariatrics

## 2021-03-23 NOTE — Telephone Encounter (Signed)
Patient called and made aware that pre cert is not needed for her home sleep study.  I have advised her to proceed with the study.  Date:   STOP BANG RISK ASSESSMENT S (snore) Have you been told that you snore?     YES   T (tired) Are you often tired, fatigued, or sleepy during the day?   YES  O (obstruction) Do you stop breathing, choke, or gasp during sleep? NO   P (pressure) Do you have or are you being treated for high blood pressure? NO   B (BMI) Is your body index greater than 35 kg/m? YES   A (age) Are you 56 years old or older? YES   N (neck) Do you have a neck circumference greater than 16 inches?   YES   G (gender) Are you a female? NO    5

## 2021-03-24 ENCOUNTER — Inpatient Hospital Stay: Payer: 59

## 2021-03-24 ENCOUNTER — Other Ambulatory Visit: Payer: Self-pay

## 2021-03-24 VITALS — BP 148/78 | HR 78 | Temp 98.2°F | Resp 20 | Wt 318.9 lb

## 2021-03-24 DIAGNOSIS — Z95828 Presence of other vascular implants and grafts: Secondary | ICD-10-CM

## 2021-03-24 DIAGNOSIS — C50511 Malignant neoplasm of lower-outer quadrant of right female breast: Secondary | ICD-10-CM

## 2021-03-24 DIAGNOSIS — Z170 Estrogen receptor positive status [ER+]: Secondary | ICD-10-CM

## 2021-03-24 DIAGNOSIS — Z5112 Encounter for antineoplastic immunotherapy: Secondary | ICD-10-CM | POA: Diagnosis not present

## 2021-03-24 LAB — CBC WITH DIFFERENTIAL (CANCER CENTER ONLY)
Abs Immature Granulocytes: 0.05 10*3/uL (ref 0.00–0.07)
Basophils Absolute: 0 10*3/uL (ref 0.0–0.1)
Basophils Relative: 1 %
Eosinophils Absolute: 0.2 10*3/uL (ref 0.0–0.5)
Eosinophils Relative: 3 %
HCT: 35.4 % — ABNORMAL LOW (ref 36.0–46.0)
Hemoglobin: 11.3 g/dL — ABNORMAL LOW (ref 12.0–15.0)
Immature Granulocytes: 1 %
Lymphocytes Relative: 21 %
Lymphs Abs: 1.4 10*3/uL (ref 0.7–4.0)
MCH: 25.6 pg — ABNORMAL LOW (ref 26.0–34.0)
MCHC: 31.9 g/dL (ref 30.0–36.0)
MCV: 80.1 fL (ref 80.0–100.0)
Monocytes Absolute: 0.8 10*3/uL (ref 0.1–1.0)
Monocytes Relative: 12 %
Neutro Abs: 4.1 10*3/uL (ref 1.7–7.7)
Neutrophils Relative %: 62 %
Platelet Count: 236 10*3/uL (ref 150–400)
RBC: 4.42 MIL/uL (ref 3.87–5.11)
RDW: 15.6 % — ABNORMAL HIGH (ref 11.5–15.5)
WBC Count: 6.5 10*3/uL (ref 4.0–10.5)
nRBC: 0 % (ref 0.0–0.2)

## 2021-03-24 LAB — CMP (CANCER CENTER ONLY)
ALT: 11 U/L (ref 0–44)
AST: 14 U/L — ABNORMAL LOW (ref 15–41)
Albumin: 4.3 g/dL (ref 3.5–5.0)
Alkaline Phosphatase: 60 U/L (ref 38–126)
Anion gap: 8 (ref 5–15)
BUN: 16 mg/dL (ref 6–20)
CO2: 29 mmol/L (ref 22–32)
Calcium: 9.6 mg/dL (ref 8.9–10.3)
Chloride: 103 mmol/L (ref 98–111)
Creatinine: 0.87 mg/dL (ref 0.44–1.00)
GFR, Estimated: >60 mL/min (ref >60)
Glucose, Bld: 115 mg/dL — ABNORMAL HIGH (ref 70–99)
Potassium: 3.4 mmol/L — ABNORMAL LOW (ref 3.5–5.1)
Sodium: 140 mmol/L (ref 135–145)
Total Bilirubin: 0.9 mg/dL (ref 0.3–1.2)
Total Protein: 7.2 g/dL (ref 6.5–8.1)

## 2021-03-24 MED ORDER — SODIUM CHLORIDE 0.9% FLUSH
10.0000 mL | Freq: Once | INTRAVENOUS | Status: AC
  Administered 2021-03-24: 10 mL

## 2021-03-24 MED ORDER — ACETAMINOPHEN 325 MG PO TABS
650.0000 mg | ORAL_TABLET | Freq: Once | ORAL | Status: AC
  Administered 2021-03-24: 650 mg via ORAL
  Filled 2021-03-24: qty 2

## 2021-03-24 MED ORDER — SODIUM CHLORIDE 0.9 % IV SOLN: Freq: Once | INTRAVENOUS | Status: AC

## 2021-03-24 MED ORDER — TRASTUZUMAB-ANNS CHEMO 150 MG IV SOLR
900.0000 mg | Freq: Once | INTRAVENOUS | Status: AC
  Administered 2021-03-24: 900 mg via INTRAVENOUS
  Filled 2021-03-24: qty 42.86

## 2021-03-24 MED ORDER — HEPARIN SOD (PORK) LOCK FLUSH 100 UNIT/ML IV SOLN
500.0000 [IU] | Freq: Once | INTRAVENOUS | Status: AC | PRN
  Administered 2021-03-24: 500 [IU]

## 2021-03-24 MED ORDER — DIPHENHYDRAMINE HCL 50 MG/ML IJ SOLN
50.0000 mg | Freq: Once | INTRAMUSCULAR | Status: AC
  Administered 2021-03-24: 50 mg via INTRAVENOUS
  Filled 2021-03-24: qty 1

## 2021-03-24 MED ORDER — SODIUM CHLORIDE 0.9% FLUSH
10.0000 mL | INTRAVENOUS | Status: DC | PRN
  Administered 2021-03-24: 10 mL

## 2021-03-24 NOTE — Patient Instructions (Signed)
[facility] MEDICAL [facility]COLOG[facility]  Discharge Instructions: °Thank you for choosing [facility] to provide your oncology and hematology care.  ° °If you have a lab appointment with the Cancer Center, please go directly to the Cancer Center and check in at the registration area. °  °Wear comfortable clothing and clothing appropriate for easy access to any Portacath or PICC line.  ° °We strive to give you quality time with your provider. You may need to reschedule your appointment if you arrive late (15 or more minutes).  Arriving late affects you and other patients whose appointments are after yours.  Also, if you miss three or more appointments without notifying the office, you may be dismissed from the clinic at the provider’s discretion.    °  °For prescription refill requests, have your pharmacy contact our office and allow 72 hours for refills to be completed.   ° °Today you received the following chemotherapy and/or immunotherapy agents: Kanjinti °  °To help prevent nausea and vomiting after your treatment, we encourage you to take your nausea medication as directed. ° °BELOW ARE SYMPTOMS THAT SHOULD BE REPORTED IMMEDIATELY: °*FEVER GREATER THAN 100.4 F (38 °C) OR HIGHER °*CHILLS OR SWEATING °*NAUSEA AND VOMITING THAT IS NOT CONTROLLED WITH YOUR NAUSEA MEDICATION °*UNUSUAL SHORTNESS OF BREATH °*UNUSUAL BRUISING OR BLEEDING °*URINARY PROBLEMS (pain or burning when urinating, or frequent urination) °*BOWEL PROBLEMS (unusual diarrhea, constipation, pain near the anus) °TENDERNESS IN MOUTH AND THROAT WITH OR WITHOUT PRESENCE OF ULCERS (sore throat, sores in mouth, or a toothache) °UNUSUAL RASH, SWELLING OR PAIN  °UNUSUAL VAGINAL DISCHARGE OR ITCHING  ° °Items with * indicate a potential emergency and should be followed up as soon as possible or go to the Emergency Department if any problems should occur. ° °Please show the CHEMOTHERAPY ALERT CARD or IMMUNOTHERAPY ALERT CARD at check-in to the  Emergency Department and triage nurse. ° °Should you have questions after your visit or need to cancel or reschedule your appointment, please contact [facility]  Dept: [phone]  and follow the prompts.  Office hours are 8:00 a.m. to 4:30 p.m. Monday - Friday. Please note that voicemails left after 4:00 p.m. may not be returned until the following business day.  We are closed weekends and major holidays. You have access to a nurse at all times for urgent questions. Please call the main number to the clinic Dept: [phone] and follow the prompts. ° ° °For any non-urgent questions, you may also contact your provider using MyChart. We now offer e-Visits for anyone 18 and older to request care online for non-urgent symptoms. For details visit [email or web address]. °  °Also download the MyChart app! Go to the app store, search "MyChart", open the app, select [facility], and log in with your MyChart username and password. ° °Due to Covid, a mask is required upon entering the hospital/clinic. If you do not have a mask, one will be given to you upon arrival. For doctor visits, patients may have 1 support person aged 18 or older with them. For treatment visits, patients cannot have anyone with them due to current Covid guidelines and our immunocompromised population.  ° °

## 2021-03-29 ENCOUNTER — Telehealth: Payer: Self-pay

## 2021-03-29 ENCOUNTER — Encounter: Payer: Self-pay | Admitting: Adult Health

## 2021-03-29 ENCOUNTER — Telehealth (HOSPITAL_COMMUNITY): Payer: Self-pay | Admitting: Surgery

## 2021-03-29 ENCOUNTER — Inpatient Hospital Stay (HOSPITAL_BASED_OUTPATIENT_CLINIC_OR_DEPARTMENT_OTHER): Payer: 59 | Admitting: Adult Health

## 2021-03-29 ENCOUNTER — Other Ambulatory Visit: Payer: Self-pay

## 2021-03-29 VITALS — BP 160/84 | HR 72 | Temp 97.7°F | Resp 18 | Ht 69.0 in | Wt 319.4 lb

## 2021-03-29 DIAGNOSIS — Z5112 Encounter for antineoplastic immunotherapy: Secondary | ICD-10-CM | POA: Diagnosis not present

## 2021-03-29 DIAGNOSIS — Z170 Estrogen receptor positive status [ER+]: Secondary | ICD-10-CM

## 2021-03-29 DIAGNOSIS — C50511 Malignant neoplasm of lower-outer quadrant of right female breast: Secondary | ICD-10-CM

## 2021-03-29 MED ORDER — LIDOCAINE-PRILOCAINE 2.5-2.5 % EX CREA: 1.0000 "application " | TOPICAL_CREAM | Freq: Every day | CUTANEOUS | 0 refills | Status: DC | PRN

## 2021-03-29 NOTE — Assessment & Plan Note (Signed)
05/03/2020:Screening mammogram showed a 1.0cm mass at the 6 o'clock position in the right breast. Diagnostic mammogram and US showed the 1.2cm mass at the 6 o'clock position in the right breast. Biopsy showed IDC, grade 3, HER-2 positive (3+), ER 15% weak, PR-, Ki67 80%.  06/02/2020:Right lumpectomy ([facility]): invasive and in situ ductal carcinoma, 1.3cm, clear margins, 1 right axillary lymph node negative for carcinoma. ER 15% weak, PR-,HER-2 positive (3+) Ki67 80%.  Treatment plan: 1.Adjuvant chemotherapy with Taxol Herceptinfollowed byHerceptin maintenance.Stopped after 11 cycles of Taxol 2.Adjuvant radiation therapy9/21/2022-12/19/2020 3.Adjuvant antiestrogen therapy ----------------------------------------------------------------------------------------------------- CurrentTreatmentHerceptinmaintenance ECHO6/28/22: EF 60-65% Herceptin toxicities: None Chemotherapy-induced anemia: Monitoring hemoglobin today is 10.7. She will get the Mirena ring removed Discussed Tamoxifen with Dr. Lindi Adie at her last appt  Breast Swelling: The physical exam is consistent with breast lymphedema.  This could have been exacerbated by her mammogram that was completed on January 11.  There are no clinical signs of breast cancer recurrence.  I have placed a referral for her to go to physical therapy for evaluation and treatment of this.  We discussed signs and symptoms of infection for her to contact us with.  She will return as scheduled for her follow-up and Herceptin treatments with Dr. Lindi Adie.

## 2021-03-29 NOTE — Progress Notes (Signed)
[facility] Follow up:    [name], [address] [place] [address]   DIAGNOSIS:  Cancer Staging  Malignant neoplasm of lower-outer quadrant of right breast of female, estrogen receptor positive ([place]) Staging form: Breast, AJCC 8th Edition - Clinical stage from 05/11/2020: Stage IA (cT1b, cN0, cM0, G3, ER+, PR-, HER2+) - Signed by Nicholas Lose, MD on 05/11/2020 Stage prefix: Initial diagnosis - Pathologic stage from 06/02/2020: Stage IA (pT1c, pN0, cM0, G3, ER+, PR-, HER2+) - Signed by Gardenia Phlegm, NP on 06/15/2020 Stage prefix: Initial diagnosis Histologic grading system: 3 grade system   SUMMARY OF ONCOLOGIC HISTORY: Oncology History  Malignant neoplasm of lower-outer quadrant of right breast of female, estrogen receptor positive ([place])  05/03/2020 Initial Diagnosis   Screening mammogram showed a 1.0cm mass at the 6 o'clock position in the right breast. Diagnostic mammogram and US showed the 1.2cm mass at the 6 o'clock position in the right breast. Biopsy showed IDC, grade 3, HER-2 positive (3+), ER 15% weak, PR-, Ki67 80%.   05/11/2020 Cancer Staging   Staging form: Breast, AJCC 8th Edition - Clinical stage from 05/11/2020: Stage IA (cT1b, cN0, cM0, G3, ER+, PR-, HER2+) - Signed by Nicholas Lose, MD on 05/11/2020 Stage prefix: Initial diagnosis    05/19/2020 Genetic Testing   Negative hereditary cancer genetic testing: no pathogenic variants detected in [facility] Breast STAT Panel and Common Hereditary Cancers Panel.  The report dates are May 19, 2020 (STAT) and May 23, 2020 (Common Hereditary).   The Common Hereditary Cancers Panel offered by [facility] includes sequencing and/or deletion duplication testing of the following 47 genes: APC, ATM, AXIN2, BARD1, BMPR1A, BRCA1, BRCA2, BRIP1, CDH1, CDK4, CDKN2A (p14ARF), CDKN2A (p16INK4a), CHEK2, CTNNA1, DICER1, EPCAM (Deletion/duplication testing only), GREM1 (promoter region  deletion/duplication testing only), GREM1, HOXB13, KIT, MEN1, MLH1, MSH2, MSH3, MSH6, MUTYH, NBN, NF1, NHTL1, PALB2, PDGFRA, PMS2, POLD1, POLE, PTEN, RAD50, RAD51C, RAD51D, SDHA, SDHB, SDHC, SDHD, SMAD4, SMARCA4. STK11, TP53, TSC1, TSC2, and VHL.  The following genes were evaluated for sequence changes only: SDHA and HOXB13 c.251G>A variant only.es only: SDHA and HOXB13 c.251G>A variant only.   06/02/2020 Surgery   Right lumpectomy ([facility]): invasive and in situ ductal carcinoma, 1.3cm, clear margins, 1 right axillary lymph node negative for carcinoma.   06/02/2020 Cancer Staging   Staging form: Breast, AJCC 8th Edition - Pathologic stage from 06/02/2020: Stage IA (pT1c, pN0, cM0, G3, ER+, PR-, HER2+) - Signed by Gardenia Phlegm, NP on 06/15/2020 Stage prefix: Initial diagnosis Histologic grading system: 3 grade system    07/08/2020 - 10/21/2020 Chemotherapy   Taxol Herceptin followed by Herceptin maintenance   11/11/2020 -  Chemotherapy   Herceptin maintenance         CURRENT THERAPY:Herceptin  INTERVAL HISTORY: Pamela [name] 56 y.o. female returns for evaluation of swelling of her breast.  She says this began yesterday when she noticed it.  She noted increasing fatigue and swelling, worse around the nipple.  She noted some warmth as well.  She says that she isn't sure if it is how she was sleeping on it.    She underwent mammogram and ultrasound on 03/15/2021 with Dr. Everett Graff.  We do not have records of this in our system.    She is undergoing Herceptin every 3 weeks and sees Dr. Haroldine Laws, most recently on 1/17 with well preserved EF and has f/u scheduled in April.   Patient Active Problem List   Diagnosis Date Noted  Prediabetes 03/13/2021   Non-intractable vomiting 07/13/2020   Port-A-Cath in place 07/08/2020   Genetic testing 05/20/2020   Family history of breast cancer 05/12/2020   Family history of lung cancer 05/12/2020   Malignant neoplasm of  lower-outer quadrant of right breast of female, estrogen receptor positive ([place]) 05/03/2020   Elevated troponin level not due myocardial infarction 09/18/2019   Mixed hyperlipidemia 09/18/2019   Hypertensive emergency 09/17/2019   Primary osteoarthritis of right hip 08/06/2018   Seasonal allergies 06/12/2018   Fibrocystic disease of breast 05/27/2018   Obstructive sleep apnea syndrome, moderate 10/27/2013   Shift work sleep disorder 10/27/2013   Psychic factors associated with diseases classified elsewhere 08/07/2013   High cholesterol 07/21/2013   Essential hypertension 07/21/2013   Snoring 07/21/2013   IUD contraception-inserted 10/01/11 10/01/2011   Class 3 severe obesity due to excess calories with serious comorbidity and body mass index (BMI) of 45.0 to 49.9 in adult ([place]) 09/03/2011    is allergic to pollen extract, latex, codeine, shellfish allergy, and betadine [povidone iodine].  MEDICAL HISTORY: Past Medical History:  Diagnosis Date   Anxiety    Asthma    Back pain    Breast cancer (HCC)    Constipation    DUB (dysfunctional uterine bleeding) 2007   Edema of both lower extremities    Elevated cholesterol    Family history of breast cancer 05/12/2020   Family history of lung cancer 05/12/2020   Food allergy    H/O menorrhagia 05/01/2006   High cholesterol    History of ovarian cyst 2007   Hypertension 03/31/2005   Increased BMI 07/11/2006   Joint pain    Migraine    N&V (nausea and vomiting)    Obesity 2007   Obstructive sleep apnea syndrome, moderate 10/27/2013   no cpap   Sleep apnea    SOB (shortness of breath)    Vitamin D deficiency    Vitamin D deficiency    Weight loss 07/11/2006    SURGICAL HISTORY: Past Surgical History:  Procedure Laterality Date   BREAST LUMPECTOMY WITH RADIOACTIVE SEED AND SENTINEL LYMPH NODE BIOPSY Right 06/02/2020   Procedure: RIGHT BREAST LUMPECTOMY WITH RADIOACTIVE SEED AND RIGHT SENTINEL LYMPH NODE BIOPSY;  Surgeon:  [name], MD;  Location: [facility];  Service: General;  Laterality: Right;   BREAST REDUCTION SURGERY  05/1991   CESAREAN SECTION     HYSTEROSCOPY  05/01/2006   PORTACATH PLACEMENT Right 06/02/2020   Procedure: INSERTION PORT-A-CATH;  Surgeon: [name], MD;  Location: [facility];  Service: General;  Laterality: Right;    SOCIAL HISTORY: Social History   Socioeconomic History   Marital status: Single    Spouse name: Not on file   Number of children: Not on file   Years of education: Not on file   Highest education level: Not on file  Occupational History   Occupation: Education officer, museum  Tobacco Use   Smoking status: Never   Smokeless tobacco: Never  Vaping Use   Vaping Use: Never used  Substance and Sexual Activity   Alcohol use: No   Drug use: Not Currently    Frequency: 2.0 times per week   Sexual activity: Not Currently    Birth control/protection: I.U.D.    Comment: Mirena  Other Topics Concern   Not on file  Social History Narrative   Not on file   Social Determinants of Health   Financial Resource Strain: Not on file  Food Insecurity: Not on  file  Transportation Needs: Not on file  Physical Activity: Not on file  Stress: Not on file  Social Connections: Not on file  Intimate Partner Violence: Not on file    FAMILY HISTORY: Family History  Problem Relation Age of Onset   Hypertension Mother    High Cholesterol Mother    Thyroid disease Mother    Cancer Mother    Sleep apnea Mother    Hypertension Father    Heart attack Father    Sudden death Father    Diabetes Maternal Grandmother    Bone cancer Maternal Grandfather        dx after 76   Breast cancer Maternal Aunt        dx 80s   Lung cancer Maternal Aunt        dx after 50    Review of Systems  Constitutional:  Negative for appetite change, chills, fatigue, fever and unexpected weight change.  HENT:   Negative for hearing loss, lump/mass and trouble  swallowing.   Eyes:  Negative for eye problems and icterus.  Respiratory:  Negative for chest tightness, cough and shortness of breath.   Cardiovascular:  Negative for chest pain, leg swelling and palpitations.  Gastrointestinal:  Negative for abdominal distention, abdominal pain, constipation, diarrhea, nausea and vomiting.  Endocrine: Negative for hot flashes.  Genitourinary:  Negative for difficulty urinating.   Musculoskeletal:  Negative for arthralgias.  Skin:  Negative for itching and rash.  Neurological:  Negative for dizziness, extremity weakness, headaches and numbness.  Hematological:  Negative for adenopathy. Does not bruise/bleed easily.  Psychiatric/Behavioral:  Negative for depression. The patient is not nervous/anxious.      PHYSICAL EXAMINATION  ECOG PERFORMANCE STATUS: 1 - Symptomatic but completely ambulatory  Vitals:   03/29/21 1308  BP: (!) 160/84  Pulse: 72  Resp: 18  Temp: 97.7 F (36.5 C)  SpO2: 99%    Physical Exam Constitutional:      General: She is not in acute distress.    Appearance: Normal appearance. She is not toxic-appearing.  HENT:     Head: Normocephalic and atraumatic.  Eyes:     General: No scleral icterus. Cardiovascular:     Rate and Rhythm: Normal rate and regular rhythm.     Pulses: Normal pulses.     Heart sounds: Normal heart sounds.  Pulmonary:     Effort: Pulmonary effort is normal.     Breath sounds: Normal breath sounds.  Chest:     Comments: Right breast with swelling noted, no tenderness, no erythema, no warmth this is consistent with radiation changes. Abdominal:     General: Abdomen is flat. Bowel sounds are normal. There is no distension.     Palpations: Abdomen is soft.     Tenderness: There is no abdominal tenderness.  Musculoskeletal:        General: No swelling.     Cervical back: Neck supple.  Lymphadenopathy:     Cervical: No cervical adenopathy.  Skin:    General: Skin is warm and dry.     Findings: No  rash.  Neurological:     General: No focal deficit present.     Mental Status: She is alert.  Psychiatric:        Mood and Affect: Mood normal.        Behavior: Behavior normal.    LABORATORY DATA:  CBC    Component Value Date/Time   WBC 6.5 03/24/2021 0812   WBC 8.9 02/22/2020 1529  RBC 4.42 03/24/2021 0812   HGB 11.3 (L) 03/24/2021 0812   HGB 13.2 09/22/2019 1040   HCT 35.4 (L) 03/24/2021 0812   HCT 41.3 09/22/2019 1040   PLT 236 03/24/2021 0812   PLT 247 09/22/2019 1040   MCV 80.1 03/24/2021 0812   MCV 83 09/22/2019 1040   MCH 25.6 (L) 03/24/2021 0812   MCHC 31.9 03/24/2021 0812   RDW 15.6 (H) 03/24/2021 0812   RDW 14.7 09/22/2019 1040   LYMPHSABS 1.4 03/24/2021 0812   MONOABS 0.8 03/24/2021 0812   EOSABS 0.2 03/24/2021 0812   BASOSABS 0.0 03/24/2021 0812    CMP     Component Value Date/Time   NA 140 03/24/2021 0812   NA 142 09/22/2019 1040   K 3.4 (L) 03/24/2021 0812   CL 103 03/24/2021 0812   CO2 29 03/24/2021 0812   GLUCOSE 115 (H) 03/24/2021 0812   BUN 16 03/24/2021 0812   BUN 11 09/22/2019 1040   CREATININE 0.87 03/24/2021 0812   CALCIUM 9.6 03/24/2021 0812   PROT 7.2 03/24/2021 0812   PROT 6.7 06/17/2019 1054   ALBUMIN 4.3 03/24/2021 0812   ALBUMIN 4.5 06/17/2019 1054   AST 14 (L) 03/24/2021 0812   ALT 11 03/24/2021 0812   ALKPHOS 60 03/24/2021 0812   BILITOT 0.9 03/24/2021 0812   GFRNONAA >60 03/24/2021 0812   GFRAA 77 09/22/2019 1040      ASSESSMENT and THERAPY PLAN:   Malignant neoplasm of lower-outer quadrant of right breast of female, estrogen receptor positive ([place]) 05/03/2020:Screening mammogram showed a 1.0cm mass at the 6 o'clock position in the right breast. Diagnostic mammogram and US showed the 1.2cm mass at the 6 o'clock position in the right breast. Biopsy showed IDC, grade 3, HER-2 positive (3+), ER 15% weak, PR-, Ki67 80%.   06/02/2020:Right lumpectomy ([facility]): invasive and in situ ductal carcinoma, 1.3cm, clear margins, 1  right axillary lymph node negative for carcinoma. ER 15% weak, PR-,HER-2 positive (3+) Ki67 80%.   Treatment plan: 1.  Adjuvant chemotherapy with Taxol Herceptin followed by Herceptin maintenance.  Stopped after 11 cycles of Taxol  2.  Adjuvant radiation therapy 11/23/2020-12/19/2020 3.  Adjuvant antiestrogen therapy ----------------------------------------------------------------------------------------------------- Current Treatment Herceptin maintenance ECHO 08/30/20: EF 60-65% Herceptin toxicities: None Chemotherapy-induced anemia: Monitoring hemoglobin today is 10.7. She will get the Mirena ring removed Discussed Tamoxifen with Dr. Lindi Adie at her last appt  Breast Swelling: The physical exam is consistent with breast lymphedema.  This could have been exacerbated by her mammogram that was completed on January 11.  There are no clinical signs of breast cancer recurrence.  I have placed a referral for her to go to physical therapy for evaluation and treatment of this.  We discussed signs and symptoms of infection for her to contact us with.  She will return as scheduled for her follow-up and Herceptin treatments with Dr. Lindi Adie.   Orders Placed This Encounter  Procedures   Ambulatory referral to Physical Therapy    Referral Priority:   Routine    Referral Type:   Physical Medicine    Referral Reason:   Specialty Services Required    Requested Specialty:   Physical Therapy    Number of Visits Requested:   1    All questions were answered. The patient knows to call the clinic with any problems, questions or concerns. We can certainly see the patient much sooner if necessary.  Total encounter time: 30 minutes in face to face visit time, chart review, lab review,  care coordination, order entry, and documentation of the encounter.   Wilber Bihari, NP 03/29/21 10:00 PM Medical Oncology and Hematology [facility] [place],  [address] Tel. [phone]     Fax. [phone]  *Total Encounter Time as defined by the Centers for Medicare and Medicaid Services includes, in addition to the face-to-face time of a patient visit (documented in the note above) non-face-to-face time: obtaining and reviewing outside history, ordering and reviewing medications, tests or procedures, care coordination (communications with other health care professionals or caregivers) and documentation in the medical record.

## 2021-03-29 NOTE — Telephone Encounter (Signed)
I called Pamela [name] regarding her ordered home sleep study.  She tells me that she has not had an opportunity to complete the study yet and will before Monday.

## 2021-03-29 NOTE — Telephone Encounter (Signed)
Return call to Pamela [name], Pamela [name] states she has noticed some swelling, warmth, and tenderness around the right breast for 1-2 days.  I scheduled Pamela [name] to come in for a visit with Mendel Ryder today, Pamela [name] verbalized understanding and thanks.

## 2021-03-30 ENCOUNTER — Telehealth: Payer: Self-pay

## 2021-03-30 NOTE — Telephone Encounter (Signed)
Spoke with pt per NP and advised pt she could use larger volume on port. Pt is aware her emla cream has been sent to [facility]. Verbalized thanks and understanding of conversation.

## 2021-03-30 NOTE — Telephone Encounter (Signed)
-----   Message from Gardenia Phlegm, NP sent at 03/29/2021 10:07 PM EST ----- Please let patient know that after discussing with pharmacy there eis not a stronger cream other than emla cream.  I sent in a larger disp volume so that she can use more at one time and hopefully that will help?    I sent it in to [facility].  Please let her know!

## 2021-04-03 ENCOUNTER — Encounter: Payer: Self-pay | Admitting: *Deleted

## 2021-04-03 NOTE — Progress Notes (Signed)
Received medical clearance from [facility] in [place] requesting okay from MD for pt to proceed with phentermine for weight loss.  Per MD okay for pt to proceed with any medication that will aid in weight loss.  RN successfully faxed signed form back [phone]).

## 2021-04-07 ENCOUNTER — Ambulatory Visit: Payer: 59 | Admitting: Hematology and Oncology

## 2021-04-07 ENCOUNTER — Ambulatory Visit: Payer: 59

## 2021-04-07 ENCOUNTER — Other Ambulatory Visit: Payer: 59

## 2021-04-07 ENCOUNTER — Telehealth (HOSPITAL_COMMUNITY): Payer: Self-pay | Admitting: Surgery

## 2021-04-07 NOTE — Telephone Encounter (Signed)
Patient called and reminded of the ordered home sleep study.  She tells me that she has not has an opportunity to do it yet and will complete it very soon.

## 2021-04-10 ENCOUNTER — Telehealth (HOSPITAL_COMMUNITY): Payer: Self-pay | Admitting: *Deleted

## 2021-04-10 NOTE — Telephone Encounter (Signed)
Received fax from [facility], pt needs clearance to participate in the program.  Per Dr Haroldine Laws: pt is an acceptable candidate for participation on the [place] along with recent OV and EKG all faxed to them at [phone]

## 2021-04-11 ENCOUNTER — Encounter: Payer: Self-pay | Admitting: Rehabilitation

## 2021-04-11 ENCOUNTER — Other Ambulatory Visit: Payer: Self-pay

## 2021-04-11 ENCOUNTER — Ambulatory Visit: Payer: 59 | Attending: Adult Health | Admitting: Rehabilitation

## 2021-04-11 DIAGNOSIS — Z483 Aftercare following surgery for neoplasm: Secondary | ICD-10-CM | POA: Diagnosis not present

## 2021-04-11 DIAGNOSIS — I890 Lymphedema, not elsewhere classified: Secondary | ICD-10-CM | POA: Insufficient documentation

## 2021-04-11 DIAGNOSIS — Z170 Estrogen receptor positive status [ER+]: Secondary | ICD-10-CM | POA: Diagnosis not present

## 2021-04-11 DIAGNOSIS — R293 Abnormal posture: Secondary | ICD-10-CM | POA: Diagnosis not present

## 2021-04-11 DIAGNOSIS — C50511 Malignant neoplasm of lower-outer quadrant of right female breast: Secondary | ICD-10-CM | POA: Diagnosis not present

## 2021-04-11 NOTE — Therapy (Signed)
OUTPATIENT PHYSICAL THERAPY ONCOLOGY EVALUATION  Patient Name: STARLENA BEIL MRN: [id number] DOB:June 05, 1965, 56 y.o., female Today's Date: 04/11/2021    Past Medical History:  Diagnosis Date   Anxiety    Asthma    Back pain    Breast cancer ([place])    Constipation    DUB (dysfunctional uterine bleeding) 2007   Edema of both lower extremities    Elevated cholesterol    Family history of breast cancer 05/12/2020   Family history of lung cancer 05/12/2020   Food allergy    H/O menorrhagia 05/01/2006   High cholesterol    History of ovarian cyst 2007   Hypertension 03/31/2005   Increased BMI 07/11/2006   Joint pain    Migraine    N&V (nausea and vomiting)    Obesity 2007   Obstructive sleep apnea syndrome, moderate 10/27/2013   no cpap   Sleep apnea    SOB (shortness of breath)    Vitamin D deficiency    Vitamin D deficiency    Weight loss 07/11/2006   Past Surgical History:  Procedure Laterality Date   BREAST LUMPECTOMY WITH RADIOACTIVE SEED AND SENTINEL LYMPH NODE BIOPSY Right 06/02/2020   Procedure: RIGHT BREAST LUMPECTOMY WITH RADIOACTIVE SEED AND RIGHT SENTINEL LYMPH NODE BIOPSY;  Surgeon: [name], MD;  Location: [place];  Service: General;  Laterality: Right;   BREAST REDUCTION SURGERY  05/1991   CESAREAN SECTION     HYSTEROSCOPY  05/01/2006   PORTACATH PLACEMENT Right 06/02/2020   Procedure: INSERTION PORT-A-CATH;  Surgeon: [name], MD;  Location: [place];  Service: General;  Laterality: Right;   Patient Active Problem List   Diagnosis Date Noted   Prediabetes 03/13/2021   Non-intractable vomiting 07/13/2020   Port-A-Cath in place 07/08/2020   Genetic testing 05/20/2020   Family history of breast cancer 05/12/2020   Family history of lung cancer 05/12/2020   Malignant neoplasm of lower-outer quadrant of right breast of female, estrogen receptor positive ([place]) 05/03/2020   Elevated troponin level not due  myocardial infarction 09/18/2019   Mixed hyperlipidemia 09/18/2019   Hypertensive emergency 09/17/2019   Primary osteoarthritis of right hip 08/06/2018   Seasonal allergies 06/12/2018   Fibrocystic disease of breast 05/27/2018   Obstructive sleep apnea syndrome, moderate 10/27/2013   Shift work sleep disorder 10/27/2013   Psychic factors associated with diseases classified elsewhere 08/07/2013   High cholesterol 07/21/2013   Essential hypertension 07/21/2013   Snoring 07/21/2013   IUD contraception-inserted 10/01/11 10/01/2011   Class 3 severe obesity due to excess calories with serious comorbidity and body mass index (BMI) of 45.0 to 49.9 in adult [facility]) 09/03/2011    PCP: [name], MD  REFERRING PROVIDER: Gardenia Phlegm, NP   REFERRING DIAG: C50.511,Z17.0 (ICD-10-CM) - Malignant neoplasm of lower-outer quadrant of right breast of female, estrogenreceptor positive ([place])   THERAPY DIAG:  Abnormal posture  Aftercare following surgery for neoplasm  Malignant neoplasm of lower-outer quadrant of right breast of female, estrogen receptor positive ([place])  Lymphedema, not elsewhere classified  ONSET DATE: 03/27/21  SUBJECTIVE  SUBJECTIVE STATEMENT:  The Rt breast started swelling since after the mammogram.  It is better now though.  No compression bra. I still have some arm pain with reaching.  Still wants to get a sleeve for the arm.    PERTINENT HISTORY:  Pt had a right lumpectomy with SLNB on 06/02/2020 ith 0/1 LN. she had chemo with taxol from 5/6 - 10/21/2020, and radiation 11/23/2020-12/20/2020. Cancer was in situ and IDC Gr 3 Her 2+, ER+, PR- with ki 67 of 80%   PAIN:  Are you having pain? No The Rt shoulder usually hurts at night when I roll on to that side  PRECAUTIONS:  lymphedema Rt UE and breast  WEIGHT BEARING RESTRICTIONS No  FALLS:  Has patient fallen in last 6 months? 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     LEISURE: no   HAND DOMINANCE : right   PRIOR LEVEL OF FUNCTION: Independent  PATIENT GOALS address the breast swelling and the shoulder pain   OBJECTIVE  COGNITION:  Overall cognitive status: Within functional limits for tasks assessed   PALPATION: Fibrosis Rt inferior breast and soft edema lateral breast and trunk  OBSERVATIONS / OTHER ASSESSMENTS:  Rt breast retracted from radiation - bil scars from reduction, skin darker and pores enlarged   POSTURE: rounded shoulders and forward head  UPPER EXTREMITY AROM/PROM:  A/PROM Right 04/11/2021 Left 04/11/2021  Shoulder extension 67 65  Shoulder flexion 160-pull in axilla 165  Shoulder abduction 135 - pull in axilla 165  Shoulder internal rotation 80   Shoulder external rotation 90 - pull in axilla 85    (Blank rows = not tested)     LYMPHEDEMA ASSESSMENTS:   SURGERY TYPE/DATE: 06/02/21  NUMBER OF LYMPH NODES REMOVED: 1    CHEMOTHERAPY: getting herceptin  RADIATION:yes  HORMONE TREATMENT: no   UE LANDMARK RIGHT 04/11/2021 LEFT 04/11/2021  At axilla    15 cm proximal to olecranon process 42   10 cm proximal to olecranon process 39   Olecranon process 30.0   15 cm proximal to ulnar styloid process    10 cm proximal to ulnar styloid process 25.4   Just proximal to ulnar styloid process 17.5   Across hand at thumb web space 20   At base of 2nd digit 6.8   (Blank rows = not tested) *full tension upper arm   TODAY'S TREATMENT  Gave pt small chip pack for fibrosis and large foam for lateral trunk Gave pt script for bras and  sleeve and information on how to order    PATIENT EDUCATION:  Education details: education on POC, breast lymphedema Person educated: Patient Education method: Theatre stage manager Education comprehension: verbalized understanding   ASSESSMENT:  CLINICAL IMPRESSION: Patient is a 56 y.o. female who was seen today for physical therapy evaluation and treatment for breast lymphedema and Rt shoulder stiffness post radiation and SLNB with lumpectomy almost 1 year ago. Pt has no signs of UE lymphedema with measurements smaller than last session.  Pt would also like to improve Rt shoulder reach and try to decrease pain with sleeping.  Objective impairments include decreased knowledge of use of DME, decreased ROM, and increased edema. These impairments are limiting patient from occupation, yard  work, and yard work. Personal factors including 1-2 comorbidities: radiation history and SLNB  are also affecting patient's functional outcome. Patient will benefit from skilled PT to address above impairments and improve overall function.  REHAB POTENTIAL: Excellent  CLINICAL DECISION MAKING: Stable/uncomplicated  EVALUATION COMPLEXITY: Low   GOALS: Goals reviewed with patient? Yes  SHORT TERM GOALS:  STG Name Target Date Goal status  1 Pt will be educated on use of compression bra with foam inserts Baseline:  04/25/2021 IN PROGRESS                                 LONG TERM GOALS:   LTG Name Target Date Goal status  1 Pt will be ind with self MLD for the Rt breast Baseline: 05/23/2021 INITIAL  2 Pt will improve Rt shoulder AROM into abduction to at least 160 Baseline:135 05/23/2021 INITIAL  3 Pt will be ind with final HEP Baseline: 05/23/2021 INITIAL  4     5     6     7       PLAN: PT FREQUENCY: 1x/week  PT DURATION: 6 weeks  PLANNED INTERVENTIONS: Therapeutic exercises, Patient/Family education, Orthotic/Fit training, Prosthetic training, DME instructions, Manual lymph  drainage, scar mobilization, and Taping  PLAN FOR NEXT SESSION: Start edu on Rt breast MLD, how is foam in bra?, include Rt shoulder PROM and upper quadrant release to improve AROM  Shan Levans, PT

## 2021-04-11 NOTE — Therapy (Signed)
OUTPATIENT PHYSICAL THERAPY ONCOLOGY EVALUATION  Patient Name: Pamela [name] MRN: [id number] DOB:1965/10/10, 56 y.o., female Today's Date: 04/11/2021   PT End of Session - 04/11/21 1704     Visit Number 1    Number of Visits 7    Date for PT Re-Evaluation 05/23/21    Authorization Type [employer]    PT Start Time 6286    PT Stop Time 1650    PT Time Calculation (min) 46 min    Activity Tolerance Patient tolerated treatment well    Behavior During Therapy [facility] for tasks assessed/performed             Past Medical History:  Diagnosis Date   Anxiety    Asthma    Back pain    Breast cancer ([place])    Constipation    DUB (dysfunctional uterine bleeding) 2007   Edema of both lower extremities    Elevated cholesterol    Family history of breast cancer 05/12/2020   Family history of lung cancer 05/12/2020   Food allergy    H/O menorrhagia 05/01/2006   High cholesterol    History of ovarian cyst 2007   Hypertension 03/31/2005   Increased BMI 07/11/2006   Joint pain    Migraine    N&V (nausea and vomiting)    Obesity 2007   Obstructive sleep apnea syndrome, moderate 10/27/2013   no cpap   Sleep apnea    SOB (shortness of breath)    Vitamin D deficiency    Vitamin D deficiency    Weight loss 07/11/2006   Past Surgical History:  Procedure Laterality Date   BREAST LUMPECTOMY WITH RADIOACTIVE SEED AND SENTINEL LYMPH NODE BIOPSY Right 06/02/2020   Procedure: RIGHT BREAST LUMPECTOMY WITH RADIOACTIVE SEED AND RIGHT SENTINEL LYMPH NODE BIOPSY;  Surgeon: [name], MD;  Location: [place];  Service: General;  Laterality: Right;   BREAST REDUCTION SURGERY  05/1991   CESAREAN SECTION     HYSTEROSCOPY  05/01/2006   PORTACATH PLACEMENT Right 06/02/2020   Procedure: INSERTION PORT-A-CATH;  Surgeon: [name], MD;  Location: [place];  Service: General;  Laterality: Right;   Patient Active Problem List   Diagnosis Date Noted   Prediabetes  03/13/2021   Non-intractable vomiting 07/13/2020   Port-A-Cath in place 07/08/2020   Genetic testing 05/20/2020   Family history of breast cancer 05/12/2020   Family history of lung cancer 05/12/2020   Malignant neoplasm of lower-outer quadrant of right breast of female, estrogen receptor positive ([place]) 05/03/2020   Elevated troponin level not due myocardial infarction 09/18/2019   Mixed hyperlipidemia 09/18/2019   Hypertensive emergency 09/17/2019   Primary osteoarthritis of right hip 08/06/2018   Seasonal allergies 06/12/2018   Fibrocystic disease of breast 05/27/2018   Obstructive sleep apnea syndrome, moderate 10/27/2013   Shift work sleep disorder 10/27/2013   Psychic factors associated with diseases classified elsewhere 08/07/2013   High cholesterol 07/21/2013   Essential hypertension 07/21/2013   Snoring 07/21/2013   IUD contraception-inserted 10/01/11 10/01/2011   Class 3 severe obesity due to excess calories with serious comorbidity and body mass index (BMI) of 45.0 to 49.9 in adult [facility]) 09/03/2011    PCP: [name], MD  REFERRING PROVIDER: Gardenia Phlegm, NP   REFERRING DIAG: C50.511,Z17.0 (ICD-10-CM) - Malignant neoplasm of lower-outer quadrant of right breast of female, estrogenreceptor positive ([place])   THERAPY DIAG:  Abnormal posture  Aftercare following surgery for neoplasm  Malignant neoplasm of lower-outer quadrant of right  breast of female, estrogen receptor positive ([place])  Lymphedema, not elsewhere classified  ONSET DATE: 03/27/21  SUBJECTIVE                                                                                                                                                                                           SUBJECTIVE STATEMENT:  The Rt breast started swelling since after the mammogram.  It is better now though.  No compression bra. I still have some arm pain with reaching.  Still wants to get a sleeve for the arm.     PERTINENT HISTORY:  Pt had a right lumpectomy with SLNB on 06/02/2020 ith 0/1 LN. she had chemo with taxol from 5/6 - 10/21/2020, and radiation 11/23/2020-12/20/2020. Cancer was in situ and IDC Gr 3 Her 2+, ER+, PR- with ki 67 of 80%   PAIN:  Are you having pain? No The Rt shoulder usually hurts at night when I roll on to that side  PRECAUTIONS: lymphedema Rt UE and breast  WEIGHT BEARING RESTRICTIONS No  FALLS:  Has patient fallen in last 6 months? 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     LEISURE: no   HAND DOMINANCE : right   PRIOR LEVEL OF FUNCTION: Independent  PATIENT GOALS address the breast swelling and the shoulder pain   OBJECTIVE  COGNITION:  Overall cognitive status: Within functional limits for tasks assessed   PALPATION: Fibrosis Rt inferior breast and soft edema lateral breast and trunk  OBSERVATIONS / OTHER ASSESSMENTS:  Rt breast retracted from radiation - bil scars from reduction, skin darker and pores enlarged   POSTURE: rounded shoulders and forward head  UPPER EXTREMITY AROM/PROM:  A/PROM Right 04/11/2021 Left 04/11/2021  Shoulder extension 67 65  Shoulder flexion 160-pull in axilla 165  Shoulder abduction 135 - pull in axilla 165  Shoulder internal rotation 80   Shoulder external rotation 90 - pull in axilla 85    (Blank rows = not tested)     LYMPHEDEMA ASSESSMENTS:   SURGERY TYPE/DATE: 06/02/21  NUMBER OF LYMPH NODES REMOVED: 1    CHEMOTHERAPY: getting herceptin  RADIATION:yes  HORMONE TREATMENT: no   UE LANDMARK RIGHT 04/11/2021 LEFT 04/11/2021  At axilla    15 cm proximal to olecranon process 42   10 cm proximal to olecranon process 39   Olecranon process 30.0  15 cm proximal to  ulnar styloid process    10 cm proximal to ulnar styloid process 25.4   Just proximal to ulnar styloid process 17.5   Across hand at thumb web space 20   At base of 2nd digit 6.8   (Blank rows = not tested) *full tension upper arm   TODAY'S TREATMENT  Gave pt small chip pack for fibrosis and large foam for lateral trunk Gave pt script for bras and sleeve and information on how to order    PATIENT EDUCATION:  Education details: education on POC, breast lymphedema Person educated: Patient Education method: Theatre stage manager Education comprehension: verbalized understanding   ASSESSMENT:  CLINICAL IMPRESSION: Patient is a 56 y.o. female who was seen today for physical therapy evaluation and treatment for breast lymphedema and Rt shoulder stiffness post radiation and SLNB with lumpectomy almost 1 year ago. Pt has no signs of UE lymphedema with measurements smaller than last session.  Pt would also like to improve Rt shoulder reach and try to decrease pain with sleeping.  Objective impairments include decreased knowledge of use of DME, decreased ROM, and increased edema. These impairments are limiting patient from occupation, yard work, and yard work. Personal factors including 1-2 comorbidities: radiation history and SLNB  are also affecting patient's functional outcome. Patient will benefit from skilled PT to address above impairments and improve overall function.  REHAB POTENTIAL: Excellent  CLINICAL DECISION MAKING: Stable/uncomplicated  EVALUATION COMPLEXITY: Low   GOALS: Goals reviewed with patient? Yes  SHORT TERM GOALS:  STG Name Target Date Goal status  1 Pt will be educated on use of compression bra with foam inserts Baseline:  04/25/2021 IN PROGRESS                                 LONG TERM GOALS:   LTG Name Target Date Goal status  1 Pt will be ind with self MLD for the Rt breast Baseline: 05/23/2021 INITIAL  2 Pt will improve Rt shoulder AROM into  abduction to at least 160 Baseline:135 05/23/2021 INITIAL  3 Pt will be ind with final HEP Baseline: 05/23/2021 INITIAL  4     5     6     7       PLAN: PT FREQUENCY: 1x/week  PT DURATION: 6 weeks  PLANNED INTERVENTIONS: Therapeutic exercises, Patient/Family education, Orthotic/Fit training, Prosthetic training, DME instructions, Manual lymph drainage, scar mobilization, and Taping  PLAN FOR NEXT SESSION: Start edu on Rt breast MLD, how is foam in bra?, include Rt shoulder PROM and upper quadrant release to improve AROM  Shan Levans, PT

## 2021-04-13 NOTE — Assessment & Plan Note (Signed)
05/03/2020:Screening mammogram showed a 1.0cm mass at the 6 o'clock position in the right breast. Diagnostic mammogram and US showed the 1.2cm mass at the 6 o'clock position in the right breast. Biopsy showed IDC, grade 3, HER-2 positive (3+), ER 15% weak, PR-, Ki67 80%.  06/02/2020:Right lumpectomy ([facility]): invasive and in situ ductal carcinoma, 1.3cm, clear margins, 1 right axillary lymph node negative for carcinoma. ER 15% weak, PR-,HER-2 positive (3+) Ki67 80%.  Treatment plan: 1.Adjuvant chemotherapy with Taxol Herceptinfollowed byHerceptin maintenance.Stopped after 11 cycles of Taxol 2.Adjuvant radiation therapy9/21/2022-12/19/2020 3.Adjuvant antiestrogen therapy ----------------------------------------------------------------------------------------------------- CurrentTreatmentHerceptinmaintenance ECHO6/28/22: EF 60-65% Herceptin toxicities: None Chemotherapy-induced anemia: Monitoring hemoglobin today is 10.7. She will get the Mirena ring removed  FSH: 28, Estradiol 14.4: not in menopause  Tamoxifen Toxicities:  RTC in 3 months for SCP visit

## 2021-04-13 NOTE — Progress Notes (Signed)
Patient Care Team: [name], MD as PCP - General (Internal Medicine) Mcarthur Rossetti, MD as Consulting Physician (Orthopedic Surgery) Mauro Kaufmann, RN as Oncology Nurse Navigator Rockwell Germany, RN as Oncology Nurse Navigator [name], MD as Consulting Physician (General Surgery) Nicholas Lose, MD as Consulting Physician (Hematology and Oncology) Eppie Gibson, MD as Attending Physician (Radiation Oncology)  DIAGNOSIS:    ICD-10-CM   1. Malignant neoplasm of lower-outer quadrant of right breast of female, estrogen receptor positive ([place])  C50.511    Z17.0       SUMMARY OF ONCOLOGIC HISTORY: Oncology History  Malignant neoplasm of lower-outer quadrant of right breast of female, estrogen receptor positive ([place])  05/03/2020 Initial Diagnosis   Screening mammogram showed a 1.0cm mass at the 6 o'clock position in the right breast. Diagnostic mammogram and US showed the 1.2cm mass at the 6 o'clock position in the right breast. Biopsy showed IDC, grade 3, HER-2 positive (3+), ER 15% weak, PR-, Ki67 80%.   05/11/2020 Cancer Staging   Staging form: Breast, AJCC 8th Edition - Clinical stage from 05/11/2020: Stage IA (cT1b, cN0, cM0, G3, ER+, PR-, HER2+) - Signed by Nicholas Lose, MD on 05/11/2020 Stage prefix: Initial diagnosis    05/19/2020 Genetic Testing   Negative hereditary cancer genetic testing: no pathogenic variants detected in [facility] Breast STAT Panel and Common Hereditary Cancers Panel.  The report dates are May 19, 2020 (STAT) and May 23, 2020 (Common Hereditary).   The Common Hereditary Cancers Panel offered by [facility] includes sequencing and/or deletion duplication testing of the following 47 genes: APC, ATM, AXIN2, BARD1, BMPR1A, BRCA1, BRCA2, BRIP1, CDH1, CDK4, CDKN2A (p14ARF), CDKN2A (p16INK4a), CHEK2, CTNNA1, DICER1, EPCAM (Deletion/duplication testing only), GREM1 (promoter region deletion/duplication testing only), GREM1, HOXB13, KIT, MEN1, MLH1,  MSH2, MSH3, MSH6, MUTYH, NBN, NF1, NHTL1, PALB2, PDGFRA, PMS2, POLD1, POLE, PTEN, RAD50, RAD51C, RAD51D, SDHA, SDHB, SDHC, SDHD, SMAD4, SMARCA4. STK11, TP53, TSC1, TSC2, and VHL.  The following genes were evaluated for sequence changes only: SDHA and HOXB13 c.251G>A variant only.es only: SDHA and HOXB13 c.251G>A variant only.   06/02/2020 Surgery   Right lumpectomy ([facility]): invasive and in situ ductal carcinoma, 1.3cm, clear margins, 1 right axillary lymph node negative for carcinoma.   06/02/2020 Cancer Staging   Staging form: Breast, AJCC 8th Edition - Pathologic stage from 06/02/2020: Stage IA (pT1c, pN0, cM0, G3, ER+, PR-, HER2+) - Signed by Gardenia Phlegm, NP on 06/15/2020 Stage prefix: Initial diagnosis Histologic grading system: 3 grade system    07/08/2020 - 10/21/2020 Chemotherapy   Taxol Herceptin followed by Herceptin maintenance   11/11/2020 -  Chemotherapy   Herceptin maintenance         CHIEF COMPLIANT: Herceptin maintenance  INTERVAL HISTORY: Pamela [name] is a 56 y.o. with above-mentioned history of breast cancer treated with lumpectomy, and is currently on  Herceptin maintenance. She reports to the clinic today for treatment.   ALLERGIES:  is allergic to pollen extract, latex, codeine, shellfish allergy, and betadine [povidone iodine].  MEDICATIONS:  Current Outpatient Medications  Medication Sig Dispense Refill   amLODipine (NORVASC) 10 MG tablet Take 1 tablet (10 mg total) by mouth daily. 90 tablet 2   aspirin EC 81 MG tablet Take 1 tablet (81 mg total) by mouth daily. Swallow whole. 30 tablet 11   Azilsartan-Chlorthalidone (EDARBYCLOR) 40-25 MG TABS Take 1 tablet by mouth daily. 90 tablet 2   hydrOXYzine (ATARAX) 25 MG tablet Take 1 tablet by mouth every 12 (twelve)  hours as needed.     levonorgestrel (MIRENA, 52 MG,) 20 MCG/DAY IUD 1 each by Intrauterine route once.     lidocaine-prilocaine (EMLA) cream Apply 1 application topically daily as needed.  60 g 0   Multiple Vitamin (MULTIVITAMIN) LIQD Take 5 mLs by mouth daily.     rosuvastatin (CRESTOR) 40 MG tablet Take 1 tablet (40 mg total) by mouth daily. 90 tablet 3   VITAMIN D, CHOLECALCIFEROL, PO Take 1 tablet by mouth daily.     Current Facility-Administered Medications  Medication Dose Route Frequency Provider Last Rate Last Admin   0.9 %  sodium chloride infusion   Intravenous PRN Ghumman, Ramandeep, NP       Facility-Administered Medications Ordered in Other Visits  Medication Dose Route Frequency Provider Last Rate Last Admin   heparin lock flush 100 unit/mL  500 Units Intracatheter Once Nicholas Lose, MD       sodium chloride flush (NS) 0.9 % injection 10 mL  10 mL Intracatheter Once Nicholas Lose, MD        PHYSICAL EXAMINATION: ECOG PERFORMANCE STATUS: 1 - Symptomatic but completely ambulatory  Vitals:   04/14/21 0834  BP: 137/75  Pulse: 78  Resp: 18  Temp: 97.7 F (36.5 C)  SpO2: 100%   Filed Weights   04/14/21 0834  Weight: (!) 307 lb 8 oz (139.5 kg)    LABORATORY DATA:  I have reviewed the data as listed CMP Latest Ref Rng & Units 03/24/2021 03/03/2021 02/13/2021  Glucose 70 - 99 mg/dL 115(H) 106(H) 107(H)  BUN 6 - 20 mg/dL _0 Creatinine 0.44 - 1.00 mg/dL 0.87 0.79 0.82  Sodium 135 - 145 mmol/L 140 141 142  Potassium 3.5 - 5.1 mmol/L 3.4(L) 3.3(L) 3.4(L)  Chloride 98 - 111 mmol/L 103 103 105  CO2 22 - 32 mmol/L 29 32 27  Calcium 8.9 - 10.3 mg/dL 9.6 9.2 9.3  Total Protein 6.5 - 8.1 g/dL 7.2 7.0 7.1  Total Bilirubin 0.3 - 1.2 mg/dL 0.9 0.8 1.0  Alkaline Phos 38 - 126 U/L 60 56 61  AST 15 - 41 U/L 14(L) 14(L) 17  ALT 0 - 44 U/L _1 Lab Results  Component Value Date   WBC 6.2 04/14/2021   HGB 11.3 (L) 04/14/2021   HCT 36.0 04/14/2021   MCV 79.8 (L) 04/14/2021   PLT 229 04/14/2021   NEUTROABS 3.9 04/14/2021    ASSESSMENT & PLAN:  Malignant neoplasm of lower-outer quadrant of right breast of female, estrogen receptor positive  ([place]) 05/03/2020:Screening mammogram showed a 1.0cm mass at the 6 o'clock position in the right breast. Diagnostic mammogram and US showed the 1.2cm mass at the 6 o'clock position in the right breast. Biopsy showed IDC, grade 3, HER-2 positive (3+), ER 15% weak, PR-, Ki67 80%.   06/02/2020:Right lumpectomy ([facility]): invasive and in situ ductal carcinoma, 1.3cm, clear margins, 1 right axillary lymph node negative for carcinoma. ER 15% weak, PR-,HER-2 positive (3+) Ki67 80%.   Treatment plan: 1.  Adjuvant chemotherapy with Taxol Herceptin followed by Herceptin maintenance.  Stopped after 11 cycles of Taxol  2.  Adjuvant radiation therapy 11/23/2020-12/19/2020 3.  Adjuvant antiestrogen therapy ----------------------------------------------------------------------------------------------------- Current Treatment Herceptin maintenance ECHO 08/30/20: EF 60-65% Herceptin toxicities: None Chemotherapy-induced anemia: Monitoring hemoglobin today is 10.7. She will get the Mirena ring removed   FSH: 28, Estradiol 14.4: not in menopause   I recommend starting tamoxifen today.  I sent a prescription for 20 mg daily.  We will start her at half a tablet daily and then if she tolerates it well she will take the full tablet. °She joined a weight loss program called [facility].  She lost about 10 to 12 pounds.  She is on phentermine. ° °RTC every 3 weeks for Herceptin every 6 weeks to follow-up with me. ° ° ° °No orders of the defined types were placed in this encounter. ° °The patient has a good understanding of the overall plan. she agrees with it. she will call with any problems that may develop before the next visit here. ° °Total time spent: 30 mins including face to face time and time spent for planning, charting and coordination of care ° °Vinay K Gudena, MD, MPH °04/14/2021 ° °I, Kirstyn Evans, am acting as scribe for Dr. Vinay Gudena. ° °I have reviewed the above documentation for accuracy and  completeness, and I agree with the above. ° ° ° ° ° ° °

## 2021-04-14 ENCOUNTER — Inpatient Hospital Stay: Payer: 59

## 2021-04-14 ENCOUNTER — Other Ambulatory Visit: Payer: Self-pay

## 2021-04-14 ENCOUNTER — Inpatient Hospital Stay (HOSPITAL_BASED_OUTPATIENT_CLINIC_OR_DEPARTMENT_OTHER): Payer: 59 | Admitting: Hematology and Oncology

## 2021-04-14 ENCOUNTER — Inpatient Hospital Stay: Payer: 59 | Attending: Adult Health

## 2021-04-14 DIAGNOSIS — D6481 Anemia due to antineoplastic chemotherapy: Secondary | ICD-10-CM | POA: Diagnosis not present

## 2021-04-14 DIAGNOSIS — Z79899 Other long term (current) drug therapy: Secondary | ICD-10-CM | POA: Diagnosis not present

## 2021-04-14 DIAGNOSIS — Z170 Estrogen receptor positive status [ER+]: Secondary | ICD-10-CM | POA: Diagnosis not present

## 2021-04-14 DIAGNOSIS — Z5112 Encounter for antineoplastic immunotherapy: Secondary | ICD-10-CM | POA: Insufficient documentation

## 2021-04-14 DIAGNOSIS — Z885 Allergy status to narcotic agent status: Secondary | ICD-10-CM | POA: Diagnosis not present

## 2021-04-14 DIAGNOSIS — I10 Essential (primary) hypertension: Secondary | ICD-10-CM

## 2021-04-14 DIAGNOSIS — C50511 Malignant neoplasm of lower-outer quadrant of right female breast: Secondary | ICD-10-CM | POA: Diagnosis not present

## 2021-04-14 DIAGNOSIS — Z95828 Presence of other vascular implants and grafts: Secondary | ICD-10-CM

## 2021-04-14 DIAGNOSIS — T451X5A Adverse effect of antineoplastic and immunosuppressive drugs, initial encounter: Secondary | ICD-10-CM | POA: Diagnosis not present

## 2021-04-14 LAB — CBC WITH DIFFERENTIAL (CANCER CENTER ONLY)
Abs Immature Granulocytes: 0.04 10*3/uL (ref 0.00–0.07)
Basophils Absolute: 0.1 10*3/uL (ref 0.0–0.1)
Basophils Relative: 1 %
Eosinophils Absolute: 0.2 10*3/uL (ref 0.0–0.5)
Eosinophils Relative: 3 %
HCT: 36 % (ref 36.0–46.0)
Hemoglobin: 11.3 g/dL — ABNORMAL LOW (ref 12.0–15.0)
Immature Granulocytes: 1 %
Lymphocytes Relative: 22 %
Lymphs Abs: 1.4 10*3/uL (ref 0.7–4.0)
MCH: 25.1 pg — ABNORMAL LOW (ref 26.0–34.0)
MCHC: 31.4 g/dL (ref 30.0–36.0)
MCV: 79.8 fL — ABNORMAL LOW (ref 80.0–100.0)
Monocytes Absolute: 0.6 10*3/uL (ref 0.1–1.0)
Monocytes Relative: 10 %
Neutro Abs: 3.9 10*3/uL (ref 1.7–7.7)
Neutrophils Relative %: 63 %
Platelet Count: 229 10*3/uL (ref 150–400)
RBC: 4.51 MIL/uL (ref 3.87–5.11)
RDW: 15.8 % — ABNORMAL HIGH (ref 11.5–15.5)
WBC Count: 6.2 10*3/uL (ref 4.0–10.5)
nRBC: 0 % (ref 0.0–0.2)

## 2021-04-14 LAB — CMP (CANCER CENTER ONLY)
ALT: 23 U/L (ref 0–44)
AST: 20 U/L (ref 15–41)
Albumin: 4.4 g/dL (ref 3.5–5.0)
Alkaline Phosphatase: 64 U/L (ref 38–126)
Anion gap: 6 (ref 5–15)
BUN: 20 mg/dL (ref 6–20)
CO2: 32 mmol/L (ref 22–32)
Calcium: 9.8 mg/dL (ref 8.9–10.3)
Chloride: 102 mmol/L (ref 98–111)
Creatinine: 0.98 mg/dL (ref 0.44–1.00)
GFR, Estimated: >60 mL/min (ref >60)
Glucose, Bld: 108 mg/dL — ABNORMAL HIGH (ref 70–99)
Potassium: 3.3 mmol/L — ABNORMAL LOW (ref 3.5–5.1)
Sodium: 140 mmol/L (ref 135–145)
Total Bilirubin: 0.9 mg/dL (ref 0.3–1.2)
Total Protein: 7.4 g/dL (ref 6.5–8.1)

## 2021-04-14 MED ORDER — ACETAMINOPHEN 325 MG PO TABS
650.0000 mg | ORAL_TABLET | Freq: Once | ORAL | Status: AC
  Administered 2021-04-14: 650 mg via ORAL
  Filled 2021-04-14: qty 2

## 2021-04-14 MED ORDER — PHENTERMINE HCL 37.5 MG PO CAPS: 37.5000 mg | ORAL_CAPSULE | ORAL | Status: DC

## 2021-04-14 MED ORDER — DIPHENHYDRAMINE HCL 50 MG/ML IJ SOLN
50.0000 mg | Freq: Once | INTRAMUSCULAR | Status: AC
  Administered 2021-04-14: 50 mg via INTRAVENOUS
  Filled 2021-04-14: qty 1

## 2021-04-14 MED ORDER — TRASTUZUMAB-ANNS CHEMO 150 MG IV SOLR
900.0000 mg | Freq: Once | INTRAVENOUS | Status: AC
  Administered 2021-04-14: 900 mg via INTRAVENOUS
  Filled 2021-04-14: qty 42.86

## 2021-04-14 MED ORDER — SODIUM CHLORIDE 0.9 % IV SOLN: Freq: Once | INTRAVENOUS | Status: AC

## 2021-04-14 MED ORDER — SODIUM CHLORIDE 0.9% FLUSH
10.0000 mL | INTRAVENOUS | Status: DC | PRN
  Administered 2021-04-14: 10 mL

## 2021-04-14 MED ORDER — SODIUM CHLORIDE 0.9% FLUSH
10.0000 mL | Freq: Once | INTRAVENOUS | Status: AC
  Administered 2021-04-14: 10 mL

## 2021-04-14 MED ORDER — HEPARIN SOD (PORK) LOCK FLUSH 100 UNIT/ML IV SOLN
500.0000 [IU] | Freq: Once | INTRAVENOUS | Status: AC | PRN
  Administered 2021-04-14: 500 [IU]

## 2021-04-14 MED ORDER — TAMOXIFEN CITRATE 20 MG PO TABS: 20.0000 mg | ORAL_TABLET | Freq: Every day | ORAL | 3 refills | Status: DC

## 2021-04-14 NOTE — Patient Instructions (Signed)
[facility]ICAL [facility]COL[facility]   ?Discharge Instructions: ?Thank you for choosing [facility] to provide your oncology and hematology care.  ? ?If you have a lab appointment with the Cancer Center, please go directly to the Cancer Center and check in at the registration area. ?  ?Wear comfortable clothing and clothing appropriate for easy access to any Portacath or PICC line.  ? ?We strive to give you quality time with your provider. You may need to reschedule your appointment if you arrive late (15 or more minutes).  Arriving late affects you and other patients whose appointments are after yours.  Also, if you miss three or more appointments without notifying the office, you may be dismissed from the clinic at the provider?s discretion.    ?  ?For prescription refill requests, have your pharmacy contact our office and allow 72 hours for refills to be completed.   ? ?Today you received the following chemotherapy and/or immunotherapy agents: trastuzumab-anns    ?  ?To help prevent nausea and vomiting after your treatment, we encourage you to take your nausea medication as directed. ? ?BELOW ARE SYMPTOMS THAT SHOULD BE REPORTED IMMEDIATELY: ?*FEVER GREATER THAN 100.4 F (38 ?C) OR HIGHER ?*CHILLS OR SWEATING ?*NAUSEA AND VOMITING THAT IS NOT CONTROLLED WITH YOUR NAUSEA MEDICATION ?*UNUSUAL SHORTNESS OF BREATH ?*UNUSUAL BRUISING OR BLEEDING ?*URINARY PROBLEMS (pain or burning when urinating, or frequent urination) ?*BOWEL PROBLEMS (unusual diarrhea, constipation, pain near the anus) ?TENDERNESS IN MOUTH AND THROAT WITH OR WITHOUT PRESENCE OF ULCERS (sore throat, sores in mouth, or a toothache) ?UNUSUAL RASH, SWELLING OR PAIN  ?UNUSUAL VAGINAL DISCHARGE OR ITCHING  ? ?Items with * indicate a potential emergency and should be followed up as soon as possible or go to the Emergency Department if any problems should occur. ? ?Please show the CHEMOTHERAPY ALERT CARD or IMMUNOTHERAPY ALERT CARD at  check-in to the Emergency Department and triage nurse. ? ?Should you have questions after your visit or need to cancel or reschedule your appointment, please contact [facility]  Dept: [phone]  and follow the prompts.  Office hours are 8:00 a.m. to 4:30 p.m. Monday - Friday. Please note that voicemails left after 4:00 p.m. may not be returned until the following business day.  We are closed weekends and major holidays. You have access to a nurse at all times for urgent questions. Please call the main number to the clinic Dept: [phone] and follow the prompts. ? ? ?For any non-urgent questions, you may also contact your provider using MyChart. We now offer e-Visits for anyone 18 and older to request care online for non-urgent symptoms. For details visit [email or web address]. ?  ?Also download the MyChart app! Go to the app store, search "MyChart", open the app, select [facility], and log in with your MyChart username and password. ? ?Due to Covid, a mask is required upon entering the hospital/clinic. If you do not have a mask, one will be given to you upon arrival. For doctor visits, patients may have 1 support person aged 18 or older with them. For treatment visits, patients cannot have anyone with them due to current Covid guidelines and our immunocompromised population.  ? ?

## 2021-04-18 ENCOUNTER — Ambulatory Visit: Payer: 59 | Admitting: Rehabilitation

## 2021-04-19 ENCOUNTER — Ambulatory Visit: Payer: 59 | Admitting: Rehabilitation

## 2021-04-19 ENCOUNTER — Other Ambulatory Visit: Payer: Self-pay

## 2021-04-19 ENCOUNTER — Encounter: Payer: Self-pay | Admitting: Rehabilitation

## 2021-04-19 DIAGNOSIS — C50511 Malignant neoplasm of lower-outer quadrant of right female breast: Secondary | ICD-10-CM | POA: Diagnosis not present

## 2021-04-19 DIAGNOSIS — I890 Lymphedema, not elsewhere classified: Secondary | ICD-10-CM

## 2021-04-19 DIAGNOSIS — R293 Abnormal posture: Secondary | ICD-10-CM

## 2021-04-19 DIAGNOSIS — Z483 Aftercare following surgery for neoplasm: Secondary | ICD-10-CM

## 2021-04-19 NOTE — Therapy (Signed)
OUTPATIENT PHYSICAL THERAPY TREATMENT NOTE   Patient Name: Pamela [name] MRN: [id number] DOB:April 03, 1965, 56 y.o., female Today's Date: 04/19/2021  PCP: [name], MD REFERRING PROVIDER: Wilber B[facility]*   PT End of Session - 04/19/21 1404     Visit Number 2    Number of Visits 7    Date for PT Re-Evaluation 05/23/21    PT Start Time 1937    Activity Tolerance Patient tolerated treatment well    Behavior During Therapy [facility] for tasks assessed/performed             Past Medical History:  Diagnosis Date   Anxiety    Asthma    Back pain    Breast cancer ([place])    Constipation    DUB (dysfunctional uterine bleeding) 2007   Edema of both lower extremities    Elevated cholesterol    Family history of breast cancer 05/12/2020   Family history of lung cancer 05/12/2020   Food allergy    H/O menorrhagia 05/01/2006   High cholesterol    History of ovarian cyst 2007   Hypertension 03/31/2005   Increased BMI 07/11/2006   Joint pain    Migraine    N&V (nausea and vomiting)    Obesity 2007   Obstructive sleep apnea syndrome, moderate 10/27/2013   no cpap   Sleep apnea    SOB (shortness of breath)    Vitamin D deficiency    Vitamin D deficiency    Weight loss 07/11/2006   Past Surgical History:  Procedure Laterality Date   BREAST LUMPECTOMY WITH RADIOACTIVE SEED AND SENTINEL LYMPH NODE BIOPSY Right 06/02/2020   Procedure: RIGHT BREAST LUMPECTOMY WITH RADIOACTIVE SEED AND RIGHT SENTINEL LYMPH NODE BIOPSY;  Surgeon: [name], MD;  Location: [place];  Service: General;  Laterality: Right;   BREAST REDUCTION SURGERY  05/1991   CESAREAN SECTION     HYSTEROSCOPY  05/01/2006   PORTACATH PLACEMENT Right 06/02/2020   Procedure: INSERTION PORT-A-CATH;  Surgeon: [name], MD;  Location: [place];  Service: General;  Laterality: Right;   Patient Active Problem List   Diagnosis Date Noted   Prediabetes 03/13/2021    Non-intractable vomiting 07/13/2020   Port-A-Cath in place 07/08/2020   Genetic testing 05/20/2020   Family history of breast cancer 05/12/2020   Family history of lung cancer 05/12/2020   Malignant neoplasm of lower-outer quadrant of right breast of female, estrogen receptor positive ([place]) 05/03/2020   Elevated troponin level not due myocardial infarction 09/18/2019   Mixed hyperlipidemia 09/18/2019   Hypertensive emergency 09/17/2019   Primary osteoarthritis of right hip 08/06/2018   Seasonal allergies 06/12/2018   Fibrocystic disease of breast 05/27/2018   Obstructive sleep apnea syndrome, moderate 10/27/2013   Shift work sleep disorder 10/27/2013   Psychic factors associated with diseases classified elsewhere 08/07/2013   High cholesterol 07/21/2013   Essential hypertension 07/21/2013   Snoring 07/21/2013   IUD contraception-inserted 10/01/11 10/01/2011   Class 3 severe obesity due to excess calories with serious comorbidity and body mass index (BMI) of 45.0 to 49.9 in adult [facility]) 09/03/2011    REFERRING DIAG: Rt breast cancer  THERAPY DIAG:  Abnormal posture  Aftercare following surgery for neoplasm  Malignant neoplasm of lower-outer quadrant of right breast of female, estrogen receptor positive ([place])  Lymphedema, not elsewhere classified  PERTINENT HISTORY: PERTINENT HISTORY:  Pt had a right lumpectomy with SLNB on 06/02/2020 ith 0/1 LN. she had chemo with taxol from 5/6 -  10/21/2020, and radiation 11/23/2020-12/20/2020. Cancer was in situ and IDC Gr 3 Her 2+, ER+, PR- with ki 67 of 80%   PRECAUTIONS: PRECAUTIONS: lymphedema Rt UE and breast   WEIGHT BEARING RESTRICTIONS No  SUBJECTIVE: I am exhausted.  My neuropathy is bad the past couple of days in the hands and feet on the Rt side.  I am getting the bra today.   PAIN:  Are you having pain? Yes NPRS scale: 7/10 Pain location: Rt lower leg and Rt arm Pain orientation: Right  PAIN TYPE: burning Pain description:  constant  Aggravating factors: nothing Relieving factors: ice and heat     TODAY'S TREATMENT  Self Care: Discussed compression options as pt is out of network with a special place.  As Sharrie Rothman from [facility] is here today she came in to discuss sleeve options.  We checked all RTW sleeve sizes but pt sizes out of the them at the upper arm, so a custom sleeve will work the best.  Pt was going to be measured today but has to leave for another appt.  Was also texting back and forth with Arrie Aran and Inez Catalina during breast clinic regarding if we can add compression socks to the referral for CIPN and that was [facility] and will be added.  Dr. Lindi Adie is having people use class 1 compression socks during chemotherapy.    MT Supine STM to the Rt axilla and latissimus in flexion stretched position and D2 flexion with MFR incorporated     ASSESSMENT:   CLINICAL IMPRESSION: Pt is starting to have some bad neuropathic type pain in the Rt lower leg and Rt UE x 2 days or so.  Pt was going to be measured by [facility] for garments but had to leave due to another appointment.  Will talk next visit to her about ordering.  This PT could also measure her for a custom garment.  May also check RTW size charts again as pt is planning on losing a lot of weight and it may not fit soon.  Started MFR and STM to the Rt axilla which was tolerated well.     REHAB POTENTIAL: Excellent   CLINICAL DECISION MAKING: Stable/uncomplicated       GOALS: Goals reviewed with patient? Yes   SHORT TERM GOALS:   STG Name Target Date Goal status  1 Pt will be educated on use of compression bra with foam inserts Baseline:  04/25/2021 IN PROGRESS                                                          LONG TERM GOALS:    LTG Name Target Date Goal status  1 Pt will be ind with self MLD for the Rt breast Baseline: 05/23/2021 INITIAL  2 Pt will improve Rt shoulder AROM into abduction to at least 160 Baseline:135 05/23/2021 INITIAL  3 Pt  will be ind with final HEP Baseline: 05/23/2021 INITIAL  4        5        6        7           PLAN: PT FREQUENCY: 1x/week   PT DURATION: 6 weeks   PLANNED INTERVENTIONS: Therapeutic exercises, Patient/Family education, Orthotic/Fit training, Prosthetic training, DME instructions, Manual lymph drainage, scar mobilization, and Taping  PLAN FOR NEXT SESSION: Start edu on Rt breast MLD, how is foam in bra?, include Rt shoulder PROM and upper quadrant release to improve AROM   Shan Levans, PT      04/19/2021, 2:05 PM

## 2021-04-25 ENCOUNTER — Encounter: Payer: Self-pay | Admitting: Rehabilitation

## 2021-04-25 ENCOUNTER — Ambulatory Visit: Payer: 59 | Admitting: Rehabilitation

## 2021-04-25 ENCOUNTER — Other Ambulatory Visit: Payer: Self-pay

## 2021-04-25 ENCOUNTER — Encounter: Payer: Self-pay | Admitting: *Deleted

## 2021-04-25 DIAGNOSIS — C50511 Malignant neoplasm of lower-outer quadrant of right female breast: Secondary | ICD-10-CM | POA: Diagnosis not present

## 2021-04-25 DIAGNOSIS — R293 Abnormal posture: Secondary | ICD-10-CM

## 2021-04-25 DIAGNOSIS — I890 Lymphedema, not elsewhere classified: Secondary | ICD-10-CM

## 2021-04-25 DIAGNOSIS — Z483 Aftercare following surgery for neoplasm: Secondary | ICD-10-CM

## 2021-04-25 NOTE — Therapy (Signed)
OUTPATIENT PHYSICAL THERAPY TREATMENT NOTE   Patient Name: Pamela [name] MRN: [id number] DOB:September 09, 1965, 56 y.o., female Today's Date: 04/25/2021  PCP: [name], MD REFERRING PROVIDER: [facility]*   PT End of Session - 04/25/21 1729     Visit Number 3    Number of Visits 7    Date for PT Re-Evaluation 05/23/21    PT Start Time 3903    PT Stop Time 0092    PT Time Calculation (min) 43 min    Activity Tolerance Patient tolerated treatment well    Behavior During Therapy [facility] for tasks assessed/performed              Past Medical History:  Diagnosis Date   Anxiety    Asthma    Back pain    Breast cancer ([place])    Constipation    DUB (dysfunctional uterine bleeding) 2007   Edema of both lower extremities    Elevated cholesterol    Family history of breast cancer 05/12/2020   Family history of lung cancer 05/12/2020   Food allergy    H/O menorrhagia 05/01/2006   High cholesterol    History of ovarian cyst 2007   Hypertension 03/31/2005   Increased BMI 07/11/2006   Joint pain    Migraine    N&V (nausea and vomiting)    Obesity 2007   Obstructive sleep apnea syndrome, moderate 10/27/2013   no cpap   Sleep apnea    SOB (shortness of breath)    Vitamin D deficiency    Vitamin D deficiency    Weight loss 07/11/2006   Past Surgical History:  Procedure Laterality Date   BREAST LUMPECTOMY WITH RADIOACTIVE SEED AND SENTINEL LYMPH NODE BIOPSY Right 06/02/2020   Procedure: RIGHT BREAST LUMPECTOMY WITH RADIOACTIVE SEED AND RIGHT SENTINEL LYMPH NODE BIOPSY;  Surgeon: [name], MD;  Location: [place];  Service: General;  Laterality: Right;   BREAST REDUCTION SURGERY  05/1991   CESAREAN SECTION     HYSTEROSCOPY  05/01/2006   PORTACATH PLACEMENT Right 06/02/2020   Procedure: INSERTION PORT-A-CATH;  Surgeon: [name], MD;  Location: [place];  Service: General;  Laterality: Right;   Patient Active Problem  List   Diagnosis Date Noted   Prediabetes 03/13/2021   Non-intractable vomiting 07/13/2020   Port-A-Cath in place 07/08/2020   Genetic testing 05/20/2020   Family history of breast cancer 05/12/2020   Family history of lung cancer 05/12/2020   Malignant neoplasm of lower-outer quadrant of right breast of female, estrogen receptor positive ([place]) 05/03/2020   Elevated troponin level not due myocardial infarction 09/18/2019   Mixed hyperlipidemia 09/18/2019   Hypertensive emergency 09/17/2019   Primary osteoarthritis of right hip 08/06/2018   Seasonal allergies 06/12/2018   Fibrocystic disease of breast 05/27/2018   Obstructive sleep apnea syndrome, moderate 10/27/2013   Shift work sleep disorder 10/27/2013   Psychic factors associated with diseases classified elsewhere 08/07/2013   High cholesterol 07/21/2013   Essential hypertension 07/21/2013   Snoring 07/21/2013   IUD contraception-inserted 10/01/11 10/01/2011   Class 3 severe obesity due to excess calories with serious comorbidity and body mass index (BMI) of 45.0 to 49.9 in adult [facility]) 09/03/2011    REFERRING DIAG: Rt breast cancer  THERAPY DIAG:  Abnormal posture  Aftercare following surgery for neoplasm  Malignant neoplasm of lower-outer quadrant of right breast of female, estrogen receptor positive ([place])  Lymphedema, not elsewhere classified  PERTINENT HISTORY: PERTINENT HISTORY:  Pt had a  right lumpectomy with SLNB on 06/02/2020 ith 0/1 LN. she had chemo with taxol from 5/6 - 10/21/2020, and radiation 11/23/2020-12/20/2020. Cancer was in situ and IDC Gr 3 Her 2+, ER+, PR- with ki 67 of 80%   PRECAUTIONS: PRECAUTIONS: lymphedema Rt UE and breast   WEIGHT BEARING RESTRICTIONS No  SUBJECTIVE: I am exhausted.  My neuropathy is bad the past couple of days in the hands and feet on the Rt side.  I am getting the bra today.   PAIN:  Are you having pain? Yes NPRS scale: 6/10 Pain location: Rt breast and arm Pain  orientation: Right  PAIN TYPE: burning Pain description: constant  Aggravating factors: nothing Relieving factors: ice and heat     TODAY'S TREATMENT  04/25/21 Orthotic Fit Training: Measured pt for custom Juzo soft sleeve and Juzo dynamic sleeve with RTW juzo soft glove to match with both sleeves.  Then measured pt for medi comfort stockings.  Benefit check sent in to [facility] before ordering. This PT has ordering information on the desk when benefits come back.    Self Care: Discussed compression options as pt is out of network with a special place.  As Sharrie Rothman from [facility] is here today she came in to discuss sleeve options.  We checked all RTW sleeve sizes but pt sizes out of the them at the upper arm, so a custom sleeve will work the best.  Pt was going to be measured today but has to leave for another appt.  Was also texting back and forth with Arrie Aran and Inez Catalina during breast clinic regarding if we can add compression socks to the referral for CIPN and that was [facility] and will be added.  Dr. Lindi Adie is having people use class 1 compression socks during chemotherapy.    MT Supine STM to the Rt axilla and latissimus in flexion stretched position and D2 flexion with MFR incorporated     ASSESSMENT:   CLINICAL IMPRESSION: Measured pt for sleeves and LE compression for [employer] pending benefit check.    REHAB POTENTIAL: Excellent   CLINICAL DECISION MAKING: Stable/uncomplicated       GOALS: Goals reviewed with patient? Yes   SHORT TERM GOALS:   STG Name Target Date Goal status  1 Pt will be educated on use of compression bra with foam inserts Baseline:  04/25/2021 IN PROGRESS                                                          LONG TERM GOALS:    LTG Name Target Date Goal status  1 Pt will be ind with self MLD for the Rt breast Baseline: 05/23/2021 INITIAL  2 Pt will improve Rt shoulder AROM into abduction to at least 160 Baseline:135 05/23/2021 INITIAL  3 Pt will be ind  with final HEP Baseline: 05/23/2021 INITIAL  4        5        6        7           PLAN: PT FREQUENCY: 1x/week   PT DURATION: 6 weeks   PLANNED INTERVENTIONS: Therapeutic exercises, Patient/Family education, Orthotic/Fit training, Prosthetic training, DME instructions, Manual lymph drainage, scar mobilization, and Taping   PLAN FOR NEXT SESSION: Start edu on Rt breast MLD, how is foam in  bra?, include Rt shoulder PROM and upper quadrant release to improve AROM   Shan Levans, PT

## 2021-04-25 NOTE — Progress Notes (Signed)
Pt requesting advice from MD if okay to take prescribed Medi weight loss enteric coated omega 3 as well as Medi weight loss vita super.  RN reviewed active ingredient with MD and MD states okay to proceed. Pt appreciative of advice.

## 2021-04-27 ENCOUNTER — Telehealth (HOSPITAL_COMMUNITY): Payer: Self-pay | Admitting: Surgery

## 2021-04-27 NOTE — Telephone Encounter (Signed)
I called patient to inform her that it was okay to proceed with ordered home sleep study and that insurance prior authorization is not required per [place].  I briefly reviewed the instructions and she says that she will complete within the next several nights.

## 2021-04-28 ENCOUNTER — Ambulatory Visit: Payer: 59

## 2021-04-28 ENCOUNTER — Other Ambulatory Visit: Payer: 59

## 2021-05-02 ENCOUNTER — Other Ambulatory Visit: Payer: Self-pay

## 2021-05-02 ENCOUNTER — Ambulatory Visit: Payer: 59

## 2021-05-02 ENCOUNTER — Encounter: Payer: Self-pay | Admitting: Rehabilitation

## 2021-05-02 DIAGNOSIS — C50511 Malignant neoplasm of lower-outer quadrant of right female breast: Secondary | ICD-10-CM

## 2021-05-02 DIAGNOSIS — R293 Abnormal posture: Secondary | ICD-10-CM

## 2021-05-02 DIAGNOSIS — Z483 Aftercare following surgery for neoplasm: Secondary | ICD-10-CM

## 2021-05-02 DIAGNOSIS — I890 Lymphedema, not elsewhere classified: Secondary | ICD-10-CM

## 2021-05-02 NOTE — Therapy (Addendum)
OUTPATIENT PHYSICAL THERAPY TREATMENT NOTE   Patient Name: Pamela [name] MRN: [id number] DOB:May 02, 1965, 56 y.o., female Today's Date: 05/02/2021  PCP: [name], MD REFERRING PROVIDER: Wilber Bihari Cornett*   PT End of Session - 05/02/21 1102     Visit Number 4    Number of Visits 7    Date for PT Re-Evaluation 05/23/21    PT Start Time 1007    PT Stop Time 1101    PT Time Calculation (min) 54 min    Activity Tolerance Patient tolerated treatment well    Behavior During Therapy [facility] for tasks assessed/performed              Past Medical History:  Diagnosis Date   Anxiety    Asthma    Back pain    Breast cancer ([place])    Constipation    DUB (dysfunctional uterine bleeding) 2007   Edema of both lower extremities    Elevated cholesterol    Family history of breast cancer 05/12/2020   Family history of lung cancer 05/12/2020   Food allergy    H/O menorrhagia 05/01/2006   High cholesterol    History of ovarian cyst 2007   Hypertension 03/31/2005   Increased BMI 07/11/2006   Joint pain    Migraine    N&V (nausea and vomiting)    Obesity 2007   Obstructive sleep apnea syndrome, moderate 10/27/2013   no cpap   Sleep apnea    SOB (shortness of breath)    Vitamin D deficiency    Vitamin D deficiency    Weight loss 07/11/2006   Past Surgical History:  Procedure Laterality Date   BREAST LUMPECTOMY WITH RADIOACTIVE SEED AND SENTINEL LYMPH NODE BIOPSY Right 06/02/2020   Procedure: RIGHT BREAST LUMPECTOMY WITH RADIOACTIVE SEED AND RIGHT SENTINEL LYMPH NODE BIOPSY;  Surgeon: [name], MD;  Location: [place];  Service: General;  Laterality: Right;   BREAST REDUCTION SURGERY  05/1991   CESAREAN SECTION     HYSTEROSCOPY  05/01/2006   PORTACATH PLACEMENT Right 06/02/2020   Procedure: INSERTION PORT-A-CATH;  Surgeon: [name], MD;  Location: [place];  Service: General;  Laterality: Right;   Patient Active Problem  List   Diagnosis Date Noted   Prediabetes 03/13/2021   Non-intractable vomiting 07/13/2020   Port-A-Cath in place 07/08/2020   Genetic testing 05/20/2020   Family history of breast cancer 05/12/2020   Family history of lung cancer 05/12/2020   Malignant neoplasm of lower-outer quadrant of right breast of female, estrogen receptor positive ([place]) 05/03/2020   Elevated troponin level not due myocardial infarction 09/18/2019   Mixed hyperlipidemia 09/18/2019   Hypertensive emergency 09/17/2019   Primary osteoarthritis of right hip 08/06/2018   Seasonal allergies 06/12/2018   Fibrocystic disease of breast 05/27/2018   Obstructive sleep apnea syndrome, moderate 10/27/2013   Shift work sleep disorder 10/27/2013   Psychic factors associated with diseases classified elsewhere 08/07/2013   High cholesterol 07/21/2013   Essential hypertension 07/21/2013   Snoring 07/21/2013   IUD contraception-inserted 10/01/11 10/01/2011   Class 3 severe obesity due to excess calories with serious comorbidity and body mass index (BMI) of 45.0 to 49.9 in adult [facility]) 09/03/2011    REFERRING DIAG: Rt breast cancer  THERAPY DIAG:  Abnormal posture  Aftercare following surgery for neoplasm  Malignant neoplasm of lower-outer quadrant of right breast of female, estrogen receptor positive ([place])  Lymphedema, not elsewhere classified  PERTINENT HISTORY: PERTINENT HISTORY:  Pt had a  right lumpectomy with SLNB on 06/02/2020 ith 0/1 LN. she had chemo with taxol from 5/6 - 10/21/2020, and radiation 11/23/2020-12/20/2020. Cancer was in situ and IDC Gr 3 Her 2+, ER+, PR- with ki 67 of 80%   PRECAUTIONS: PRECAUTIONS: lymphedema Rt UE and breast   WEIGHT BEARING RESTRICTIONS No  SUBJECTIVE: I really like my new bras. They fit so much better than the sports bras I got and insurance covered 2 compression bras and 4 regular. I've been wearing the foam but don't feel like it's made much of a difference.   PAIN:  Are you  having pain? Yes NPRS scale: 5/10 Pain location: Rt breast and outer upper arm Pain orientation: Right  PAIN TYPE: burning Pain description: constant  Aggravating factors: nothing Relieving factors: ice and heat     TODAY'S TREATMENT  05/02/21:  MT: MLD: In Supine: Short neck, 5 diaphragmatic breaths, Lt axillary and Rt inguinal nodes, anterior inter-axillary and Rt axillo-inguinal nodes, then focused on Rt breast, also in Lt S/L for lateral aspect of breast redirecting to axillo-inguinal and posterior inter-axillary anastomosis, then finished retracing all steps in supine beginning to instruct pt while performing.  P/ROM: In Supine to Rt shoulder into flexion and D2 for better stretch at scar tissue in axilla MFR: To Rt axilla over scar tissue   04/25/21 Orthotic Fit Training: Measured pt for custom Juzo soft sleeve and Juzo dynamic sleeve with RTW juzo soft glove to match with both sleeves.  Then measured pt for medi comfort stockings.  Benefit check sent in to [facility] before ordering. This PT has ordering information on the desk when benefits come back.    Self Care: Discussed compression options as pt is out of network with a special place.  As Sharrie Rothman from [place] is here today she came in to discuss sleeve options.  We checked all RTW sleeve sizes but pt sizes out of the them at the upper arm, so a custom sleeve will work the best.  Pt was going to be measured today but has to leave for another appt.  Was also texting back and forth with Arrie Aran and Inez Catalina during breast clinic regarding if we can add compression socks to the referral for CIPN and that was [facility] and will be added.  Dr. Lindi Adie is having people use class 1 compression socks during chemotherapy.    MT Supine STM to the Rt axilla and latissimus in flexion stretched position and D2 flexion with MFR incorporated     ASSESSMENT:   CLINICAL IMPRESSION: First session of manual lymph drainage to Rt breast. Began instructing pt in  basic principles of MLD and anatomy of lymphatic system. Also included MFR to Rt axilla at area of scar tissue. Pt reports her breast didn't feel as heavy at end of session. Pt reports the foam didn't seem to help much. Her insurance doesn't cover compression so she will use [employer] for one time purchase of compression sleeve. Pt verbalizes understanding of this and signed form.   REHAB POTENTIAL: Excellent   CLINICAL DECISION MAKING: Stable/uncomplicated       GOALS: Goals reviewed with patient? Yes   SHORT TERM GOALS:   STG Name Target Date Goal status  1 Pt will be educated on use of compression bra with foam inserts Baseline:  04/25/2021 IN PROGRESS  LONG TERM GOALS:    LTG Name Target Date Goal status  1 Pt will be ind with self MLD for the Rt breast Baseline: 05/23/2021 INITIAL  2 Pt will improve Rt shoulder AROM into abduction to at least 160 Baseline:135 05/23/2021 INITIAL  3 Pt will be ind with final HEP Baseline: 05/23/2021 INITIAL  4        5        6        7           PLAN: PT FREQUENCY: 1x/week   PT DURATION: 6 weeks   PLANNED INTERVENTIONS: Therapeutic exercises, Patient/Family education, Orthotic/Fit training, Prosthetic training, DME instructions, Manual lymph drainage, scar mobilization, and Taping   PLAN FOR NEXT SESSION: Cont and instruct Rt breast MLD having pt return demo and issue handout,include Rt shoulder PROM and upper quadrant release to improve AROM   Collie Siad, PTA 05/02/21 11:12 AM

## 2021-05-04 ENCOUNTER — Other Ambulatory Visit: Payer: Self-pay | Admitting: *Deleted

## 2021-05-04 DIAGNOSIS — C50511 Malignant neoplasm of lower-outer quadrant of right female breast: Secondary | ICD-10-CM

## 2021-05-05 ENCOUNTER — Inpatient Hospital Stay: Payer: 59 | Attending: Hematology and Oncology

## 2021-05-05 ENCOUNTER — Inpatient Hospital Stay: Payer: 59

## 2021-05-05 ENCOUNTER — Other Ambulatory Visit: Payer: Self-pay

## 2021-05-05 ENCOUNTER — Telehealth: Payer: Self-pay | Admitting: *Deleted

## 2021-05-05 ENCOUNTER — Other Ambulatory Visit: Payer: Self-pay | Admitting: *Deleted

## 2021-05-05 VITALS — BP 115/68 | HR 70 | Temp 98.7°F | Resp 18 | Wt 303.5 lb

## 2021-05-05 DIAGNOSIS — T451X5A Adverse effect of antineoplastic and immunosuppressive drugs, initial encounter: Secondary | ICD-10-CM | POA: Insufficient documentation

## 2021-05-05 DIAGNOSIS — D6481 Anemia due to antineoplastic chemotherapy: Secondary | ICD-10-CM | POA: Insufficient documentation

## 2021-05-05 DIAGNOSIS — R5383 Other fatigue: Secondary | ICD-10-CM | POA: Insufficient documentation

## 2021-05-05 DIAGNOSIS — C50511 Malignant neoplasm of lower-outer quadrant of right female breast: Secondary | ICD-10-CM

## 2021-05-05 DIAGNOSIS — Z793 Long term (current) use of hormonal contraceptives: Secondary | ICD-10-CM | POA: Insufficient documentation

## 2021-05-05 DIAGNOSIS — Z885 Allergy status to narcotic agent status: Secondary | ICD-10-CM | POA: Insufficient documentation

## 2021-05-05 DIAGNOSIS — Z79899 Other long term (current) drug therapy: Secondary | ICD-10-CM | POA: Diagnosis not present

## 2021-05-05 DIAGNOSIS — R232 Flushing: Secondary | ICD-10-CM | POA: Insufficient documentation

## 2021-05-05 DIAGNOSIS — Z95828 Presence of other vascular implants and grafts: Secondary | ICD-10-CM

## 2021-05-05 DIAGNOSIS — Z170 Estrogen receptor positive status [ER+]: Secondary | ICD-10-CM | POA: Diagnosis not present

## 2021-05-05 DIAGNOSIS — Z5112 Encounter for antineoplastic immunotherapy: Secondary | ICD-10-CM | POA: Diagnosis not present

## 2021-05-05 LAB — CMP (CANCER CENTER ONLY)
ALT: 23 U/L (ref 0–44)
AST: 21 U/L (ref 15–41)
Albumin: 4.2 g/dL (ref 3.5–5.0)
Alkaline Phosphatase: 61 U/L (ref 38–126)
Anion gap: 7 (ref 5–15)
BUN: 12 mg/dL (ref 6–20)
CO2: 31 mmol/L (ref 22–32)
Calcium: 9.6 mg/dL (ref 8.9–10.3)
Chloride: 103 mmol/L (ref 98–111)
Creatinine: 0.95 mg/dL (ref 0.44–1.00)
GFR, Estimated: >60 mL/min (ref >60)
Glucose, Bld: 105 mg/dL — ABNORMAL HIGH (ref 70–99)
Potassium: 3.4 mmol/L — ABNORMAL LOW (ref 3.5–5.1)
Sodium: 141 mmol/L (ref 135–145)
Total Bilirubin: 0.9 mg/dL (ref 0.3–1.2)
Total Protein: 7.1 g/dL (ref 6.5–8.1)

## 2021-05-05 LAB — CBC WITH DIFFERENTIAL (CANCER CENTER ONLY)
Abs Immature Granulocytes: 0.01 10*3/uL (ref 0.00–0.07)
Basophils Absolute: 0 10*3/uL (ref 0.0–0.1)
Basophils Relative: 1 %
Eosinophils Absolute: 0.2 10*3/uL (ref 0.0–0.5)
Eosinophils Relative: 3 %
HCT: 36.1 % (ref 36.0–46.0)
Hemoglobin: 11.6 g/dL — ABNORMAL LOW (ref 12.0–15.0)
Immature Granulocytes: 0 %
Lymphocytes Relative: 26 %
Lymphs Abs: 1.5 10*3/uL (ref 0.7–4.0)
MCH: 25.8 pg — ABNORMAL LOW (ref 26.0–34.0)
MCHC: 32.1 g/dL (ref 30.0–36.0)
MCV: 80.4 fL (ref 80.0–100.0)
Monocytes Absolute: 0.6 10*3/uL (ref 0.1–1.0)
Monocytes Relative: 10 %
Neutro Abs: 3.4 10*3/uL (ref 1.7–7.7)
Neutrophils Relative %: 60 %
Platelet Count: 213 10*3/uL (ref 150–400)
RBC: 4.49 MIL/uL (ref 3.87–5.11)
RDW: 15.1 % (ref 11.5–15.5)
WBC Count: 5.7 10*3/uL (ref 4.0–10.5)
nRBC: 0 % (ref 0.0–0.2)

## 2021-05-05 MED ORDER — DIPHENHYDRAMINE HCL 50 MG/ML IJ SOLN
50.0000 mg | Freq: Once | INTRAMUSCULAR | Status: AC
  Administered 2021-05-05: 50 mg via INTRAVENOUS
  Filled 2021-05-05: qty 1

## 2021-05-05 MED ORDER — SODIUM CHLORIDE 0.9% FLUSH
10.0000 mL | INTRAVENOUS | Status: DC | PRN
  Administered 2021-05-05: 10 mL

## 2021-05-05 MED ORDER — CYANOCOBALAMIN 1000 MCG/ML IJ SOLN
1000.0000 ug | Freq: Once | INTRAMUSCULAR | Status: AC
  Administered 2021-05-05: 1000 ug via INTRAMUSCULAR
  Filled 2021-05-05: qty 1

## 2021-05-05 MED ORDER — SODIUM CHLORIDE 0.9% FLUSH
10.0000 mL | Freq: Once | INTRAVENOUS | Status: AC
  Administered 2021-05-05: 10 mL

## 2021-05-05 MED ORDER — SODIUM CHLORIDE 0.9 % IV SOLN: Freq: Once | INTRAVENOUS | Status: AC

## 2021-05-05 MED ORDER — HEPARIN SOD (PORK) LOCK FLUSH 100 UNIT/ML IV SOLN
500.0000 [IU] | Freq: Once | INTRAVENOUS | Status: AC | PRN
  Administered 2021-05-05: 500 [IU]

## 2021-05-05 MED ORDER — TRASTUZUMAB-ANNS CHEMO 150 MG IV SOLR
900.0000 mg | Freq: Once | INTRAVENOUS | Status: AC
  Administered 2021-05-05: 900 mg via INTRAVENOUS
  Filled 2021-05-05: qty 42.86

## 2021-05-05 MED ORDER — ACETAMINOPHEN 325 MG PO TABS
650.0000 mg | ORAL_TABLET | Freq: Once | ORAL | Status: AC
  Administered 2021-05-05: 650 mg via ORAL
  Filled 2021-05-05: qty 2

## 2021-05-05 NOTE — Telephone Encounter (Signed)
Per request of NP, RN attempt x1 to contact pt regarding the need for Vitamin B12 testing to help determine the frequency of injections.  No answer, unable to LVM due to VM being full.  Vitamin B12 lab added to be drawn at next appt.  ?

## 2021-05-09 ENCOUNTER — Ambulatory Visit: Payer: 59 | Attending: Adult Health | Admitting: Rehabilitation

## 2021-05-09 ENCOUNTER — Encounter: Payer: Self-pay | Admitting: Rehabilitation

## 2021-05-09 ENCOUNTER — Other Ambulatory Visit: Payer: Self-pay

## 2021-05-09 DIAGNOSIS — R293 Abnormal posture: Secondary | ICD-10-CM | POA: Insufficient documentation

## 2021-05-09 DIAGNOSIS — C50511 Malignant neoplasm of lower-outer quadrant of right female breast: Secondary | ICD-10-CM | POA: Diagnosis present

## 2021-05-09 DIAGNOSIS — Z483 Aftercare following surgery for neoplasm: Secondary | ICD-10-CM | POA: Diagnosis present

## 2021-05-09 DIAGNOSIS — Z170 Estrogen receptor positive status [ER+]: Secondary | ICD-10-CM | POA: Diagnosis present

## 2021-05-09 DIAGNOSIS — I890 Lymphedema, not elsewhere classified: Secondary | ICD-10-CM | POA: Diagnosis present

## 2021-05-09 NOTE — Therapy (Signed)
OUTPATIENT PHYSICAL THERAPY TREATMENT NOTE   Patient Name: Pamela [name] MRN: [id number] DOB:05-09-1965, 56 y.o., female Today's Date: 05/09/2021  PCP: [name], MD REFERRING PROVIDER: Wilber Bihari Cornett*   PT End of Session - 05/09/21 1603     Visit Number 5    Number of Visits 13    Date for PT Re-Evaluation 07/04/21    PT Start Time 6811    PT Stop Time 1700    PT Time Calculation (min) 55 min    Activity Tolerance Patient tolerated treatment well    Behavior During Therapy [facility] for tasks assessed/performed              Past Medical History:  Diagnosis Date   Anxiety    Asthma    Back pain    Breast cancer ([place])    Constipation    DUB (dysfunctional uterine bleeding) 2007   Edema of both lower extremities    Elevated cholesterol    Family history of breast cancer 05/12/2020   Family history of lung cancer 05/12/2020   Food allergy    H/O menorrhagia 05/01/2006   High cholesterol    History of ovarian cyst 2007   Hypertension 03/31/2005   Increased BMI 07/11/2006   Joint pain    Migraine    N&V (nausea and vomiting)    Obesity 2007   Obstructive sleep apnea syndrome, moderate 10/27/2013   no cpap   Sleep apnea    SOB (shortness of breath)    Vitamin D deficiency    Vitamin D deficiency    Weight loss 07/11/2006   Past Surgical History:  Procedure Laterality Date   BREAST LUMPECTOMY WITH RADIOACTIVE SEED AND SENTINEL LYMPH NODE BIOPSY Right 06/02/2020   Procedure: RIGHT BREAST LUMPECTOMY WITH RADIOACTIVE SEED AND RIGHT SENTINEL LYMPH NODE BIOPSY;  Surgeon: [name], MD;  Location: [place];  Service: General;  Laterality: Right;   BREAST REDUCTION SURGERY  05/1991   CESAREAN SECTION     HYSTEROSCOPY  05/01/2006   PORTACATH PLACEMENT Right 06/02/2020   Procedure: INSERTION PORT-A-CATH;  Surgeon: [name], MD;  Location: [place];  Service: General;  Laterality: Right;   Patient Active Problem  List   Diagnosis Date Noted   Prediabetes 03/13/2021   Non-intractable vomiting 07/13/2020   Port-A-Cath in place 07/08/2020   Genetic testing 05/20/2020   Family history of breast cancer 05/12/2020   Family history of lung cancer 05/12/2020   Malignant neoplasm of lower-outer quadrant of right breast of female, estrogen receptor positive ([place]) 05/03/2020   Elevated troponin level not due myocardial infarction 09/18/2019   Mixed hyperlipidemia 09/18/2019   Hypertensive emergency 09/17/2019   Primary osteoarthritis of right hip 08/06/2018   Seasonal allergies 06/12/2018   Fibrocystic disease of breast 05/27/2018   Obstructive sleep apnea syndrome, moderate 10/27/2013   Shift work sleep disorder 10/27/2013   Psychic factors associated with diseases classified elsewhere 08/07/2013   High cholesterol 07/21/2013   Essential hypertension 07/21/2013   Snoring 07/21/2013   IUD contraception-inserted 10/01/11 10/01/2011   Class 3 severe obesity due to excess calories with serious comorbidity and body mass index (BMI) of 45.0 to 49.9 in adult [facility]) 09/03/2011    REFERRING DIAG: Rt breast cancer  THERAPY DIAG:  Abnormal posture  Aftercare following surgery for neoplasm  Malignant neoplasm of lower-outer quadrant of right breast of female, estrogen receptor positive ([place])  Lymphedema, not elsewhere classified  PERTINENT HISTORY: PERTINENT HISTORY:  Pt had a  right lumpectomy with SLNB on 06/02/2020 ith 0/1 LN. she had chemo with taxol from 5/6 - 10/21/2020, and radiation 11/23/2020-12/20/2020. Cancer was in situ and IDC Gr 3 Her 2+, ER+, PR- with ki 67 of 80%   PRECAUTIONS: PRECAUTIONS: lymphedema Rt UE and breast   WEIGHT BEARING RESTRICTIONS No  SUBJECTIVE: The breast massage went well last time.  I felt much better and I am looking forward to it again today  PAIN:  Are you having pain? Yes NPRS scale: 5/10 Pain location: Rt breast and outer upper arm Pain orientation: Right  PAIN  TYPE: burning Pain description: constant  Aggravating factors: nothing Relieving factors: ice and heat     TODAY'S TREATMENT  05/09/21: Reviewed final [employer] order which was faxed today MLD: With pt permission: In Supine: Short neck, 5 diaphragmatic breaths, Bil axillary and Rt inguinal nodes, anterior inter-axillary and Rt axillo-inguinal nodes, then focused on Rt breast, also in Lt S/L for lateral aspect of breast redirecting to axillo-inguinal and posterior inter-axillary anastomosis, then finished retracing all steps in supine beginning to instruct pt while performing. Gave pt handout on self massage with vcs, tcs, and hand over hand instruction in supine. See below for handout.   P/ROM: In Supine to Rt shoulder into flexion and D2 for better stretch at scar tissue in axilla MFR: To Rt axilla over scar tissue  05/02/21:  MT: MLD: In Supine: Short neck, 5 diaphragmatic breaths, Lt axillary and Rt inguinal nodes, anterior inter-axillary and Rt axillo-inguinal nodes, then focused on Rt breast, also in Lt S/L for lateral aspect of breast redirecting to axillo-inguinal and posterior inter-axillary anastomosis, then finished retracing all steps in supine beginning to instruct pt while performing.  P/ROM: In Supine to Rt shoulder into flexion and D2 for better stretch at scar tissue in axilla MFR: To Rt axilla over scar tissue  04/25/21 Orthotic Fit Training: Measured pt for custom Juzo soft sleeve and Juzo dynamic sleeve with RTW juzo soft glove to match with both sleeves.  Then measured pt for medi comfort stockings.  Benefit check sent in to [facility] before ordering. This PT has ordering information on the desk when benefits come back.  Self Care: Discussed compression options as pt is out of network with a special place.  As Sharrie Rothman from [place] is here today she came in to discuss sleeve options.  We checked all RTW sleeve sizes but pt sizes out of the them at the upper arm, so a custom sleeve will  work the best.  Pt was going to be measured today but has to leave for another appt.  Was also texting back and forth with Arrie Aran and Inez Catalina during breast clinic regarding if we can add compression socks to the referral for CIPN and that was [facility] and will be added.  Dr. Lindi Adie is having people use class 1 compression socks during chemotherapy.   MT Supine STM to the Rt axilla and latissimus in flexion stretched position and D2 flexion with MFR incorporated     ASSESSMENT:  CLINICAL IMPRESSION: Pt happy with results of MT and MLD to the Rt breast.  Pt with mild fibrosis of the breast and tightness in the Rt axilla continuing.  Extended POC for pt to schedule more visits.     REHAB POTENTIAL: Excellent   CLINICAL DECISION MAKING: Stable/uncomplicated       GOALS: Goals reviewed with patient? Yes   SHORT TERM GOALS:   STG Name Target Date Goal status  1 Pt  will be educated on use of compression bra with foam inserts Baseline:  04/25/2021 MET                                                          LONG TERM GOALS:    LTG Name Target Date Goal status  1 Pt will be ind with self MLD for the Rt breast Baseline: 05/23/2021 Progressing  2 Pt will improve Rt shoulder AROM into abduction to at least 160 Baseline:135 05/23/2021 Progressing  3 Pt will be ind with final HEP Baseline: 05/23/2021 Progressing  4        5        6        7           PLAN: PT FREQUENCY: 1x/week   PT DURATION: 6 weeks   PLANNED INTERVENTIONS: Therapeutic exercises, Patient/Family education, Orthotic/Fit training, Prosthetic training, DME instructions, Manual lymph drainage, scar mobilization, and Taping   PLAN FOR NEXT SESSION: Cont Rt breast MLD having pt return demo (used handout per below),include Rt shoulder PROM and upper quadrant release to improve AROM   Shan Levans, PT    Manual Lymph Drainage for Right Breast. Do daily. Do slowly. Use flat hands with just enough pressure to stretch the  skin. Do not slide over the skin, stretch the skin with the hand. (Stretch ? Relax ? Move) Lie down or sit comfortably (in a recliner, for example) to do this. 1. Hug yourself: cross arms and do circles at collar bones near neck 5-7 times (to wake up lots of lymph nodes in this area). 2. Take slow deep breaths, allowing your belly to balloon out as you breathe in, 5x (to wake  up abdominal lymph nodes). 3. Both armpits--stretch skin in small circles to stimulate intact lymph nodes there, 5-7x. 4. Right groin area, at panty line--stretch skin in small circles to stimulate lymph nodes 5-7x. 5. Redirect fluid from right chest toward left armpit (stretch skin starting at right chest in 3-4 spots working toward left armpit) 3-4x across the chest. 6. Redirect fluid from right armpit toward right groin (cup your hand around the curve of your rightside and do 3-4 pumps from armpit to groin) 3-4x down your side. 7. Draw an imaginary diagonal line from upper outer breast through the nipple area toward lower inner breast. Direct fluid upward and inward from this line toward the pathway across your upper chest (established in #5). Do this in three rows to treat all the upper inner breast and do each row 3-4x. 8. Then direct the fluid down and out from this line toward the pathway down your side going  towards the left groin (established in #6). Do this in three rows and do each row 3-4x.  9. Then repeat your pathways (#5 and #6) 10.End with repeating #3 and #4 above. (circles in both armpits and the right groin

## 2021-05-16 ENCOUNTER — Ambulatory Visit: Payer: 59 | Admitting: Rehabilitation

## 2021-05-18 ENCOUNTER — Encounter: Payer: Self-pay | Admitting: *Deleted

## 2021-05-19 ENCOUNTER — Ambulatory Visit: Payer: 59 | Admitting: Hematology and Oncology

## 2021-05-19 ENCOUNTER — Other Ambulatory Visit: Payer: 59

## 2021-05-19 ENCOUNTER — Ambulatory Visit: Payer: 59

## 2021-05-23 ENCOUNTER — Ambulatory Visit: Payer: 59

## 2021-05-23 ENCOUNTER — Other Ambulatory Visit: Payer: Self-pay

## 2021-05-23 DIAGNOSIS — Z170 Estrogen receptor positive status [ER+]: Secondary | ICD-10-CM

## 2021-05-23 DIAGNOSIS — I890 Lymphedema, not elsewhere classified: Secondary | ICD-10-CM

## 2021-05-23 DIAGNOSIS — R293 Abnormal posture: Secondary | ICD-10-CM | POA: Diagnosis not present

## 2021-05-23 DIAGNOSIS — Z483 Aftercare following surgery for neoplasm: Secondary | ICD-10-CM

## 2021-05-23 NOTE — Therapy (Signed)
?OUTPATIENT PHYSICAL THERAPY TREATMENT NOTE ? ? ?Patient Name: Pamela [name] ?MRN: [id number] ?DOB:1965/09/08, 56 y.o., female ?Today's Date: 05/23/2021 ? ?PCP: [name], MD ?REFERRING PROVIDER: Deanne Coffer* ? ? ? ? ? ?Past Medical History:  ?Diagnosis Date  ? Anxiety   ? Asthma   ? Back pain   ? Breast cancer ([place])   ? Constipation   ? DUB (dysfunctional uterine bleeding) 2007  ? Edema of both lower extremities   ? Elevated cholesterol   ? Family history of breast cancer 05/12/2020  ? Family history of lung cancer 05/12/2020  ? Food allergy   ? H/O menorrhagia 05/01/2006  ? High cholesterol   ? History of ovarian cyst 2007  ? Hypertension 03/31/2005  ? Increased BMI 07/11/2006  ? Joint pain   ? Migraine   ? N&V (nausea and vomiting)   ? Obesity 2007  ? Obstructive sleep apnea syndrome, moderate 10/27/2013  ? no cpap  ? Sleep apnea   ? SOB (shortness of breath)   ? Vitamin D deficiency   ? Vitamin D deficiency   ? Weight loss 07/11/2006  ? ?Past Surgical History:  ?Procedure Laterality Date  ? BREAST LUMPECTOMY WITH RADIOACTIVE SEED AND SENTINEL LYMPH NODE BIOPSY Right 06/02/2020  ? Procedure: RIGHT BREAST LUMPECTOMY WITH RADIOACTIVE SEED AND RIGHT SENTINEL LYMPH NODE BIOPSY;  Surgeon: [name], MD;  Location: [facility];  Service: General;  Laterality: Right;  ? BREAST REDUCTION SURGERY  05/1991  ? CESAREAN SECTION    ? HYSTEROSCOPY  05/01/2006  ? PORTACATH PLACEMENT Right 06/02/2020  ? Procedure: INSERTION PORT-A-CATH;  Surgeon: [name], MD;  Location: [facility];  Service: General;  Laterality: Right;  ? ?Patient Active Problem List  ? Diagnosis Date Noted  ? Prediabetes 03/13/2021  ? Non-intractable vomiting 07/13/2020  ? Port-A-Cath in place 07/08/2020  ? Genetic testing 05/20/2020  ? Family history of breast cancer 05/12/2020  ? Family history of lung cancer 05/12/2020  ? Malignant neoplasm of lower-outer quadrant of right breast of female, estrogen  receptor positive ([place]) 05/03/2020  ? Elevated troponin level not due myocardial infarction 09/18/2019  ? Mixed hyperlipidemia 09/18/2019  ? Hypertensive emergency 09/17/2019  ? Primary osteoarthritis of right hip 08/06/2018  ? Seasonal allergies 06/12/2018  ? Fibrocystic disease of breast 05/27/2018  ? Obstructive sleep apnea syndrome, moderate 10/27/2013  ? Shift work sleep disorder 10/27/2013  ? Psychic factors associated with diseases classified elsewhere 08/07/2013  ? High cholesterol 07/21/2013  ? Essential hypertension 07/21/2013  ? Snoring 07/21/2013  ? IUD contraception-inserted 10/01/11 10/01/2011  ? Class 3 severe obesity due to excess calories with serious comorbidity and body mass index (BMI) of 45.0 to 49.9 in adult [facility]) 09/03/2011  ? ? ?REFERRING DIAG: Rt breast cancer ? ?THERAPY DIAG:  ?Abnormal posture ? ?Aftercare following surgery for neoplasm ? ?Malignant neoplasm of lower-outer quadrant of right breast of female, estrogen receptor positive ([place]) ? ?Lymphedema, not elsewhere classified ? ?PERTINENT HISTORY: PERTINENT HISTORY:  ?Pt had a right lumpectomy with SLNB on 06/02/2020 ith 0/1 LN. she had chemo with taxol from 5/6 - 10/21/2020, and radiation 11/23/2020-12/20/2020. Cancer was in situ and IDC Gr 3 Her 2+, ER+, PR- with ki 67 of 80%  ? ?PRECAUTIONS: PRECAUTIONS: lymphedema Rt UE and breast ?  ?WEIGHT BEARING RESTRICTIONS No ? ?SUBJECTIVE: I am able to sleep in the compression bra for a few hours before I wake up and have to take it off. I try to  wear it during the day but can also only wear it for a few hours at a time. I am using the compression foam she gave me but can't tell a big improvement with that. I've been doing the self MLD about every 2 days and both days during the weekend.  ? ?PAIN:  ?Are you having pain? Yes ?NPRS scale: 3-4/10 ?Pain location: Rt breast ?Pain orientation: Right  ?PAIN TYPE: heavy ?Pain description: constant ?Aggravating factors: it's been better ?Relieving  factors: self MLD has been helping ? ? ? ?TODAY'S TREATMENT  ?05/23/21: ?Manual Therapy ?MLD: In Supine: Short neck, 5 diaphragmatic breaths, Lt axillary and Rt inguinal nodes, anterior inter-axillary and Rt axillo-inguinal nodes, then focused on Rt breast, also in Lt S/L for lateral aspect of breast redirecting to axillo-inguinal and posterior inter-axillary anastomosis, then finished retracing all steps in supine reviewing with pt while performing with vcs, tcs, and hand over hand instruction in supine and had pt return demo.  ?P/ROM: In Supine to Rt shoulder into flexion and abduction for better stretch at scar tissue in axilla ?MFR: To Rt axilla over scar tissue ?05/09/21: ?Reviewed final [facility] order which was faxed today ?MLD: With pt permission: In Supine: Short neck, 5 diaphragmatic breaths, Bil axillary and Rt inguinal nodes, anterior inter-axillary and Rt axillo-inguinal nodes, then focused on Rt breast, also in Lt S/L for lateral aspect of breast redirecting to axillo-inguinal and posterior inter-axillary anastomosis, then finished retracing all steps in supine beginning to instruct pt while performing. Gave pt handout on self massage with vcs, tcs, and hand over hand instruction in supine. See below for handout.   ?P/ROM: In Supine to Rt shoulder into flexion and D2 for better stretch at scar tissue in axilla ?MFR: To Rt axilla over scar tissue ? ?05/02/21:  ?MT: ?MLD: In Supine: Short neck, 5 diaphragmatic breaths, Lt axillary and Rt inguinal nodes, anterior inter-axillary and Rt axillo-inguinal nodes, then focused on Rt breast, also in Lt S/L for lateral aspect of breast redirecting to axillo-inguinal and posterior inter-axillary anastomosis, then finished retracing all steps in supine beginning to instruct pt while performing.  ?P/ROM: In Supine to Rt shoulder into flexion and D2 for better stretch at scar tissue in axilla ?MFR: To Rt axilla over scar tissue ? ? ? ?  ?ASSESSMENT:  ?CLINICAL  IMPRESSION: ?Cont with MLD today reviewing with pt while therapist performed. Also had her return each step to assess technique and make corrections accordingly. Pt benefited from practicing Rt axillo-inguinal anastomosis in Lt S/L as she was able to better reach the lateral aspect of her breast better and the anastomosis.  ?  ?REHAB POTENTIAL: Excellent ?  ?CLINICAL DECISION MAKING: Stable/uncomplicated ?  ?  ?  ?GOALS: ?Goals reviewed with patient? Yes ?  ?SHORT TERM GOALS: ?  ?STG Name Target Date Goal status  ?1 Pt will be educated on use of compression bra with foam inserts ?Baseline:  04/25/2021 MET  ?         ?         ?         ?         ?         ?         ?  ?LONG TERM GOALS:  ?  ?LTG Name Target Date Goal status  ?1 Pt will be ind with self MLD for the Rt breast ?Baseline: 05/23/2021 Progressing  ?2 Pt will improve Rt shoulder AROM into abduction  to at least 160 ?Baseline:135 05/23/2021 Progressing  ?3 Pt will be ind with final HEP ?Baseline: 05/23/2021 Progressing  ?4        ?5        ?6        ?7        ?  ?PLAN: ?PT FREQUENCY: 1x/week ?  ?PT DURATION: 6 weeks ?  ?PLANNED INTERVENTIONS: Therapeutic exercises, Patient/Family education, Orthotic/Fit training, Prosthetic training, DME instructions, Manual lymph drainage, scar mobilization, and Taping ?  ?PLAN FOR NEXT SESSION: Cont Rt breast MLD having pt return demo (used handout per below),include Rt shoulder PROM and upper quadrant release to improve AROM ?  ? ?Collie Siad, PTA ?05/23/21 12:20 PM ? ? ?Manual Lymph Drainage for Right Breast. ?Do daily. Do slowly. ?Use flat hands with just enough pressure to stretch the skin. ?Do not slide over the skin, stretch the skin with the hand. (Stretch ? Relax ? Move) ?Lie down or sit comfortably (in a recliner, for example) to do this. ?1. Hug yourself: cross arms and do circles at collar bones near neck 5-7 times (to ?wake up? lots of lymph nodes in this area). ?2. Take slow deep breaths, allowing your  belly to balloon out as you breathe in, 5x (to ?wake  ?up? abdominal lymph nodes). ?3. Both armpits--stretch skin in small circles to stimulate intact lymph nodes there, 5-7x. ?4. Right groin area, at panty line--stretch sk

## 2021-05-25 NOTE — Assessment & Plan Note (Addendum)
05/03/2020:Screening mammogram showed a 1.0cm mass at the 6 o'clock position in the right breast. Diagnostic mammogram and US showed the 1.2cm mass at the 6 o'clock position in the right breast. Biopsy showed IDC, grade 3, HER-2 positive (3+), ER 15% weak, PR-, Ki67 80%. ?? ?06/02/2020:Right lumpectomy ([facility]): invasive and in situ ductal carcinoma, 1.3cm, clear margins, 1 right axillary lymph node negative for carcinoma. ER 15% weak, PR-,HER-2 positive (3+) Ki67 80%. ?? ?Treatment plan: ?1.??Adjuvant chemotherapy with Taxol Herceptin?followed by?Herceptin maintenance.??Stopped after 11 cycles of Taxol ??2.??Adjuvant radiation therapy?11/23/2020-12/19/2020 ?3.??Adjuvant antiestrogen therapy ?----------------------------------------------------------------------------------------------------- ?Current?Treatment?Herceptin?maintenance ?ECHO?08/30/20: EF 60-65% ?Herceptin toxicities: None ?Chemotherapy-induced anemia: Monitoring hemoglobin today is 10.7. ?  ?? ?FSH: 28, Estradiol 14.4: not in menopause ?? ?Tamoxifen toxicities: Started 04/14/2021 ?1.  Mood swings and irritability ?2. hot flashes ? ?She joined a weight loss program called [facility] weight [facility].  She lost about 10 to 12 pounds.  She is on phentermine.  She is hoping to discuss with her primary care physician about going on weight loss injections ?If she requires blood work for her primary care physician we are happy to do it with her lab appointment. ?? ?RTC every 3 weeks for Herceptin every 6 weeks to follow-up with me.  She has 2 more infusions of Herceptin left and after that her port can be removed.  I sent a message to Dr. Brantley Stage. ?

## 2021-05-25 NOTE — Progress Notes (Signed)
? ?Patient Care Team: ?[name], MD as PCP - General (Internal Medicine) ?Mcarthur Rossetti, MD as Consulting Physician (Orthopedic Surgery) ?Mauro Kaufmann, RN as Oncology Nurse Navigator ?Rockwell Germany, RN as Oncology Nurse Navigator ?[name], MD as Consulting Physician (General Surgery) ?Nicholas Lose, MD as Consulting Physician (Hematology and Oncology) ?Eppie Gibson, MD as Attending Physician (Radiation Oncology) ? ?DIAGNOSIS:  ?Encounter Diagnosis  ?Name Primary?  ? Malignant neoplasm of lower-outer quadrant of right breast of female, estrogen receptor positive ([place])   ? ? ?SUMMARY OF ONCOLOGIC HISTORY: ?Oncology History  ?Malignant neoplasm of lower-outer quadrant of right breast of female, estrogen receptor positive ([place])  ?05/03/2020 Initial Diagnosis  ? Screening mammogram showed a 1.0cm mass at the 6 o'clock position in the right breast. Diagnostic mammogram and US showed the 1.2cm mass at the 6 o'clock position in the right breast. Biopsy showed IDC, grade 3, HER-2 positive (3+), ER 15% weak, PR-, Ki67 80%. ?  ?05/11/2020 Cancer Staging  ? Staging form: Breast, AJCC 8th Edition ?- Clinical stage from 05/11/2020: Stage IA (cT1b, cN0, cM0, G3, ER+, PR-, HER2+) - Signed by Nicholas Lose, MD on 05/11/2020 ?Stage prefix: Initial diagnosis ? ?  ?05/19/2020 Genetic Testing  ? Negative hereditary cancer genetic testing: no pathogenic variants detected in [facility] Breast STAT Panel and Common Hereditary Cancers Panel.  The report dates are May 19, 2020 (STAT) and May 23, 2020 (Common Hereditary).  ? ?The Common Hereditary Cancers Panel offered by [facility] includes sequencing and/or deletion duplication testing of the following 47 genes: APC, ATM, AXIN2, BARD1, BMPR1A, BRCA1, BRCA2, BRIP1, CDH1, CDK4, CDKN2A (p14ARF), CDKN2A (p16INK4a), CHEK2, CTNNA1, DICER1, EPCAM (Deletion/duplication testing only), GREM1 (promoter region deletion/duplication testing only), GREM1, HOXB13, KIT, MEN1, MLH1,  MSH2, MSH3, MSH6, MUTYH, NBN, NF1, NHTL1, PALB2, PDGFRA, PMS2, POLD1, POLE, PTEN, RAD50, RAD51C, RAD51D, SDHA, SDHB, SDHC, SDHD, SMAD4, SMARCA4. STK11, TP53, TSC1, TSC2, and VHL.  The following genes were evaluated for sequence changes only: SDHA and HOXB13 c.251G>A variant only.es only: SDHA and HOXB13 c.251G>A variant only. ?  ?06/02/2020 Surgery  ? Right lumpectomy ([facility]): invasive and in situ ductal carcinoma, 1.3cm, clear margins, 1 right axillary lymph node negative for carcinoma. ?  ?06/02/2020 Cancer Staging  ? Staging form: Breast, AJCC 8th Edition ?- Pathologic stage from 06/02/2020: Stage IA (pT1c, pN0, cM0, G3, ER+, PR-, HER2+) - Signed by Gardenia Phlegm, NP on 06/15/2020 ?Stage prefix: Initial diagnosis ?Histologic grading system: 3 grade system ? ?  ?07/08/2020 - 10/21/2020 Chemotherapy  ? Taxol Herceptin followed by Herceptin maintenance ?  ?11/11/2020 -  Chemotherapy  ? Herceptin maintenance ? ?  ? ?  ?04/14/2021 -  Anti-estrogen oral therapy  ? Tamoxifen 10 mg ?  ? ? ?CHIEF COMPLIANT: Herceptin maintenance ? ?INTERVAL HISTORY: Pamela [name] is a 56 y.o. with above-mentioned history of breast cancer treated with lumpectomy, and is currently on  Herceptin. maintenance. She reports to the clinic today for treatment. She said everything was going great. She do have some fatigue. She complains of being cold. It started last month. ? ? ?ALLERGIES:  is allergic to pollen extract, latex, codeine, shellfish allergy, and betadine [povidone iodine]. ? ?MEDICATIONS:  ?Current Outpatient Medications  ?Medication Sig Dispense Refill  ? amLODipine (NORVASC) 10 MG tablet Take 1 tablet (10 mg total) by mouth daily. 90 tablet 2  ? aspirin EC 81 MG tablet Take 1 tablet (81 mg total) by mouth daily. Swallow whole. 30 tablet 11  ? Azilsartan-Chlorthalidone (EDARBYCLOR) 40-25 MG  TABS Take 1 tablet by mouth daily. 90 tablet 2  ? hydrOXYzine (ATARAX) 25 MG tablet Take 1 tablet by mouth every 12 (twelve) hours as  needed.    ? levonorgestrel (MIRENA, 52 MG,) 20 MCG/DAY IUD 1 each by Intrauterine route once.    ? lidocaine-prilocaine (EMLA) cream Apply 1 application topically daily as needed. 60 g 0  ? Multiple Vitamin (MULTIVITAMIN) LIQD Take 5 mLs by mouth daily.    ? OVER THE COUNTER MEDICATION Take 1 capsule by mouth daily. [facility] weight loss enteric coated omega 3    ? OVER THE COUNTER MEDICATION Take 1 capsule by mouth daily. [facility] weight loss Calcium four blend    ? OVER THE COUNTER MEDICATION Take 1 capsule by mouth daily. [facility] weight loss fat burner    ? OVER THE COUNTER MEDICATION Take 1 capsule by mouth daily. Medi weight loss vita super    ? phentermine (ADIPEX-P) 37.5 MG tablet Take 37.5 mg by mouth every morning.    ? phentermine 37.5 MG capsule Take 1 capsule (37.5 mg total) by mouth every morning.    ? rosuvastatin (CRESTOR) 40 MG tablet Take 1 tablet (40 mg total) by mouth daily. 90 tablet 3  ? tamoxifen (NOLVADEX) 20 MG tablet Take 1 tablet (20 mg total) by mouth daily. 90 tablet 3  ? VITAMIN D, CHOLECALCIFEROL, PO Take 1 tablet by mouth daily.    ? ?Current Facility-Administered Medications  ?Medication Dose Route Frequency Provider Last Rate Last Admin  ? 0.9 %  sodium chloride infusion   Intravenous PRN Bary Castilla, NP      ? ?Facility-Administered Medications Ordered in Other Visits  ?Medication Dose Route Frequency Provider Last Rate Last Admin  ? heparin lock flush 100 unit/mL  500 Units Intracatheter Once Nicholas Lose, MD      ? sodium chloride flush (NS) 0.9 % injection 10 mL  10 mL Intracatheter Once Nicholas Lose, MD      ? ? ?PHYSICAL EXAMINATION: ?ECOG PERFORMANCE STATUS: 1 - Symptomatic but completely ambulatory ? ?Vitals:  ? 05/26/21 0845  ?BP: (!) 150/85  ?Pulse: 89  ?Resp: 18  ?Temp: (!) 97.1 ?F (36.2 ?C)  ?SpO2: 99%  ? ?Filed Weights  ? 05/26/21 0845  ?Weight: (!) 308 lb 1.6 oz (139.8 kg)  ? ?  ? ?LABORATORY DATA:  ?I have reviewed the data as listed ? ?  Latest Ref Rng & Units 05/26/2021   ?  8:37 AM 05/05/2021  ?  8:24 AM 04/14/2021  ?  8:17 AM  ?CMP  ?Glucose 70 - 99 mg/dL 111   105   108    ?BUN 6 - 20 mg/dL 13   12   20     ?Creatinine 0.44 - 1.00 mg/dL 0.86   0.95   0.98    ?Sodium 135 - 145 mmol/L 142   141   140    ?Potassium 3.5 - 5.1 mmol/L 3.5   3.4   3.3    ?Chloride 98 - 111 mmol/L 106   103   102    ?CO2 22 - 32 mmol/L 31   31   32    ?Calcium 8.9 - 10.3 mg/dL 9.1   9.6   9.8    ?Total Protein 6.5 - 8.1 g/dL 6.8   7.1   7.4    ?Total Bilirubin 0.3 - 1.2 mg/dL 0.7   0.9   0.9    ?Alkaline Phos 38 - 126 U/L 57   61  64    ?AST 15 - 41 U/L 15   21   20     ?ALT 0 - 44 U/L 13   23   23     ? ? ?Lab Results  ?Component Value Date  ? WBC 6.2 05/26/2021  ? HGB 10.9 (L) 05/26/2021  ? HCT 34.1 (L) 05/26/2021  ? MCV 80.6 05/26/2021  ? PLT 215 05/26/2021  ? NEUTROABS 3.9 05/26/2021  ? ? ?ASSESSMENT & PLAN:  ?Malignant neoplasm of lower-outer quadrant of right breast of female, estrogen receptor positive ([place]) ?05/03/2020:Screening mammogram showed a 1.0cm mass at the 6 o'clock position in the right breast. Diagnostic mammogram and US showed the 1.2cm mass at the 6 o'clock position in the right breast. Biopsy showed IDC, grade 3, HER-2 positive (3+), ER 15% weak, PR-, Ki67 80%. ?  ?06/02/2020:Right lumpectomy ([facility]): invasive and in situ ductal carcinoma, 1.3cm, clear margins, 1 right axillary lymph node negative for carcinoma. ER 15% weak, PR-,HER-2 positive (3+) Ki67 80%. ?  ?Treatment plan: ?1.  Adjuvant chemotherapy with Taxol Herceptin followed by Herceptin maintenance.  Stopped after 11 cycles of Taxol ? 2.  Adjuvant radiation therapy 11/23/2020-12/19/2020 ?3.  Adjuvant antiestrogen therapy ?----------------------------------------------------------------------------------------------------- ?Current Treatment Herceptin maintenance ?ECHO 08/30/20: EF 60-65% ?Herceptin toxicities: None ?Chemotherapy-induced anemia: Monitoring hemoglobin today is 10.7. ?  ?  ?FSH: 28, Estradiol 14.4: not in menopause ?   ?Tamoxifen toxicities: Started 04/14/2021 ?1.  Mood swings and irritability ?2. hot flashes ? ?She joined a weight loss program called [facility].  She lost about 10 to 12 pounds.  She is on

## 2021-05-26 ENCOUNTER — Inpatient Hospital Stay: Payer: 59

## 2021-05-26 ENCOUNTER — Other Ambulatory Visit: Payer: Self-pay

## 2021-05-26 ENCOUNTER — Inpatient Hospital Stay (HOSPITAL_BASED_OUTPATIENT_CLINIC_OR_DEPARTMENT_OTHER): Payer: 59 | Admitting: Hematology and Oncology

## 2021-05-26 ENCOUNTER — Telehealth: Payer: Self-pay | Admitting: Rehabilitation

## 2021-05-26 DIAGNOSIS — C50511 Malignant neoplasm of lower-outer quadrant of right female breast: Secondary | ICD-10-CM | POA: Diagnosis not present

## 2021-05-26 DIAGNOSIS — Z170 Estrogen receptor positive status [ER+]: Secondary | ICD-10-CM

## 2021-05-26 DIAGNOSIS — Z5112 Encounter for antineoplastic immunotherapy: Secondary | ICD-10-CM | POA: Diagnosis not present

## 2021-05-26 DIAGNOSIS — Z95828 Presence of other vascular implants and grafts: Secondary | ICD-10-CM

## 2021-05-26 LAB — CBC WITH DIFFERENTIAL (CANCER CENTER ONLY)
Abs Immature Granulocytes: 0.04 10*3/uL (ref 0.00–0.07)
Basophils Absolute: 0 10*3/uL (ref 0.0–0.1)
Basophils Relative: 1 %
Eosinophils Absolute: 0.2 10*3/uL (ref 0.0–0.5)
Eosinophils Relative: 3 %
HCT: 34.1 % — ABNORMAL LOW (ref 36.0–46.0)
Hemoglobin: 10.9 g/dL — ABNORMAL LOW (ref 12.0–15.0)
Immature Granulocytes: 1 %
Lymphocytes Relative: 23 %
Lymphs Abs: 1.4 10*3/uL (ref 0.7–4.0)
MCH: 25.8 pg — ABNORMAL LOW (ref 26.0–34.0)
MCHC: 32 g/dL (ref 30.0–36.0)
MCV: 80.6 fL (ref 80.0–100.0)
Monocytes Absolute: 0.6 10*3/uL (ref 0.1–1.0)
Monocytes Relative: 9 %
Neutro Abs: 3.9 10*3/uL (ref 1.7–7.7)
Neutrophils Relative %: 63 %
Platelet Count: 215 10*3/uL (ref 150–400)
RBC: 4.23 MIL/uL (ref 3.87–5.11)
RDW: 15.2 % (ref 11.5–15.5)
WBC Count: 6.2 10*3/uL (ref 4.0–10.5)
nRBC: 0 % (ref 0.0–0.2)

## 2021-05-26 LAB — CMP (CANCER CENTER ONLY)
ALT: 13 U/L (ref 0–44)
AST: 15 U/L (ref 15–41)
Albumin: 3.9 g/dL (ref 3.5–5.0)
Alkaline Phosphatase: 57 U/L (ref 38–126)
Anion gap: 5 (ref 5–15)
BUN: 13 mg/dL (ref 6–20)
CO2: 31 mmol/L (ref 22–32)
Calcium: 9.1 mg/dL (ref 8.9–10.3)
Chloride: 106 mmol/L (ref 98–111)
Creatinine: 0.86 mg/dL (ref 0.44–1.00)
GFR, Estimated: >60 mL/min (ref >60)
Glucose, Bld: 111 mg/dL — ABNORMAL HIGH (ref 70–99)
Potassium: 3.5 mmol/L (ref 3.5–5.1)
Sodium: 142 mmol/L (ref 135–145)
Total Bilirubin: 0.7 mg/dL (ref 0.3–1.2)
Total Protein: 6.8 g/dL (ref 6.5–8.1)

## 2021-05-26 LAB — VITAMIN B12: Vitamin B-12: 938 pg/mL — ABNORMAL HIGH (ref 180–914)

## 2021-05-26 MED ORDER — DIPHENHYDRAMINE HCL 50 MG/ML IJ SOLN
50.0000 mg | Freq: Once | INTRAMUSCULAR | Status: AC
  Administered 2021-05-26: 50 mg via INTRAVENOUS
  Filled 2021-05-26: qty 1

## 2021-05-26 MED ORDER — TRASTUZUMAB-ANNS CHEMO 150 MG IV SOLR
900.0000 mg | Freq: Once | INTRAVENOUS | Status: AC
  Administered 2021-05-26: 900 mg via INTRAVENOUS
  Filled 2021-05-26: qty 42.86

## 2021-05-26 MED ORDER — SODIUM CHLORIDE 0.9% FLUSH
10.0000 mL | INTRAVENOUS | Status: DC | PRN
  Administered 2021-05-26: 10 mL

## 2021-05-26 MED ORDER — HEPARIN SOD (PORK) LOCK FLUSH 100 UNIT/ML IV SOLN
500.0000 [IU] | Freq: Once | INTRAVENOUS | Status: AC | PRN
  Administered 2021-05-26: 500 [IU]

## 2021-05-26 MED ORDER — SODIUM CHLORIDE 0.9% FLUSH
10.0000 mL | Freq: Once | INTRAVENOUS | Status: AC
  Administered 2021-05-26: 10 mL

## 2021-05-26 MED ORDER — SODIUM CHLORIDE 0.9 % IV SOLN: Freq: Once | INTRAVENOUS | Status: AC

## 2021-05-26 MED ORDER — CYANOCOBALAMIN 1000 MCG/ML IJ SOLN
1000.0000 ug | Freq: Once | INTRAMUSCULAR | Status: AC
  Administered 2021-05-26: 1000 ug via INTRAMUSCULAR
  Filled 2021-05-26: qty 1

## 2021-05-26 MED ORDER — ACETAMINOPHEN 325 MG PO TABS
650.0000 mg | ORAL_TABLET | Freq: Once | ORAL | Status: AC
  Administered 2021-05-26: 650 mg via ORAL
  Filled 2021-05-26: qty 2

## 2021-05-26 NOTE — Patient Instructions (Signed)
[facility]  Discharge Instructions: ?Thank you for choosing [facility] to provide your oncology and hematology care.  ? ?If you have a lab appointment with the [facility], please go directly to the [facility] and check in at the registration area. ?  ?Wear comfortable clothing and clothing appropriate for easy access to any Portacath or PICC line.  ? ?We strive to give you quality time with your provider. You may need to reschedule your appointment if you arrive late (15 or more minutes).  Arriving late affects you and other patients whose appointments are after yours.  Also, if you miss three or more appointments without notifying the office, you may be dismissed from the clinic at the provider?s discretion.    ?  ?For prescription refill requests, have your pharmacy contact our office and allow 72 hours for refills to be completed.   ? ?Today you received the following chemotherapy and/or immunotherapy agents: Kanjinti  ?  ?To help prevent nausea and vomiting after your treatment, we encourage you to take your nausea medication as directed. ? ?BELOW ARE SYMPTOMS THAT SHOULD BE REPORTED IMMEDIATELY: ?*FEVER GREATER THAN 100.4 F (38 ?C) OR HIGHER ?*CHILLS OR SWEATING ?*NAUSEA AND VOMITING THAT IS NOT CONTROLLED WITH YOUR NAUSEA MEDICATION ?*UNUSUAL SHORTNESS OF BREATH ?*UNUSUAL BRUISING OR BLEEDING ?*URINARY PROBLEMS (pain or burning when urinating, or frequent urination) ?*BOWEL PROBLEMS (unusual diarrhea, constipation, pain near the anus) ?TENDERNESS IN MOUTH AND THROAT WITH OR WITHOUT PRESENCE OF ULCERS (sore throat, sores in mouth, or a toothache) ?UNUSUAL RASH, SWELLING OR PAIN  ?UNUSUAL VAGINAL DISCHARGE OR ITCHING  ? ?Items with * indicate a potential emergency and should be followed up as soon as possible or go to the Emergency Department if any problems should occur. ? ?Please show the CHEMOTHERAPY ALERT CARD or IMMUNOTHERAPY ALERT CARD at check-in to the  Emergency Department and triage nurse. ? ?Should you have questions after your visit or need to cancel or reschedule your appointment, please contact [facility]  Dept: [phone]  and follow the prompts.  Office hours are 8:00 a.m. to 4:30 p.m. Monday - Friday. Please note that voicemails left after 4:00 p.m. may not be returned until the following business day.  We are closed weekends and major holidays. You have access to a nurse at all times for urgent questions. Please call the main number to the clinic Dept: [phone] and follow the prompts. ? ? ?For any non-urgent questions, you may also contact your provider using MyChart. We now offer e-Visits for anyone 5 and older to request care online for non-urgent symptoms. For details visit [email or web address]. ?  ?Also download the MyChart app! Go to the app store, search "MyChart", open the app, select [facility], and log in with your MyChart username and password. ? ?Due to Covid, a mask is required upon entering the hospital/clinic. If you do not have a mask, one will be given to you upon arrival. For doctor visits, patients may have 1 support person aged 69 or older with them. For treatment visits, patients cannot have anyone with them due to current Covid guidelines and our immunocompromised population.  ? ?Vitamin B12 Injection ?What is this medication? ?Vitamin B12 (VAHY tuh min B12) prevents and treats low vitamin B12 levels in your body. It is used in people who do not get enough vitamin B12 from their diet or when their digestive tract does not absorb enough. Vitamin B12 plays an  important role in maintaining the health of your nervous system and red blood cells. ?This medicine may be used for other purposes; ask your health care provider or pharmacist if you have questions. ?COMMON BRAND NAME(S): B-12 Compliance Kit, B-12 Injection Kit, Cyomin, Dodex, LA-12, Nutri-Twelve, Physicians EZ Use B-12, Primabalt ?What  should I tell my care team before I take this medication? ?They need to know if you have any of these conditions: ?Kidney disease ?Leber's disease ?Megaloblastic anemia ?An unusual or allergic reaction to cyanocobalamin, cobalt, other medications, foods, dyes, or preservatives ?Pregnant or trying to get pregnant ?Breast-feeding ?How should I use this medication? ?This medication is injected into a muscle or deeply under the skin. It is usually given in a clinic or care team's office. However, your care team may teach you how to inject yourself. Follow all instructions. ?Talk to your care team about the use of this medication in children. Special care may be needed. ?Overdosage: If you think you have taken too much of this medicine contact a poison control center or emergency room at once. ?NOTE: This medicine is only for you. Do not share this medicine with others. ?What if I miss a dose? ?If you are given your dose at a clinic or care team's office, call to reschedule your appointment. If you give your own injections, and you miss a dose, take it as soon as you can. If it is almost time for your next dose, take only that dose. Do not take double or extra doses. ?What may interact with this medication? ?Colchicine ?Heavy alcohol intake ?This list may not describe all possible interactions. Give your health care provider a list of all the medicines, herbs, non-prescription drugs, or dietary supplements you use. Also tell them if you smoke, drink alcohol, or use illegal drugs. Some items may interact with your medicine. ?What should I watch for while using this medication? ?Visit your care team regularly. You may need blood work done while you are taking this medication. ?You may need to follow a special diet. Talk to your care team. Limit your alcohol intake and avoid smoking to get the best benefit. ?What side effects may I notice from receiving this medication? ?Side effects that you should report to your care team  as soon as possible: ?Allergic reactions--skin rash, itching, hives, swelling of the face, lips, tongue, or throat ?Swelling of the ankles, hands, or feet ?Trouble breathing ?Side effects that usually do not require medical attention (report to your care team if they continue or are bothersome): ?Diarrhea ?This list may not describe all possible side effects. Call your doctor for medical advice about side effects. You may report side effects to FDA at 1-800-FDA-1088. ?Where should I keep my medication? ?Keep out of the reach of children. ?Store at room temperature between 15 and 30 degrees C (59 and 85 degrees F). Protect from light. Throw away any unused medication after the expiration date. ?NOTE: This sheet is a summary. It may not cover all possible information. If you have questions about this medicine, talk to your doctor, pharmacist, or health care provider. ?? 2022 Elsevier/Gold Standard (2020-05-04 00:00:00) ? ?

## 2021-05-26 NOTE — Telephone Encounter (Signed)
[facility] reached out to this therapist stating they are having trouble contacting patient and shipping to the PO BOX given on the demo sheet.  They will ship the garments to this PT office to distribute during a future appointment.  ? ? ?

## 2021-05-30 ENCOUNTER — Ambulatory Visit: Payer: 59

## 2021-05-30 ENCOUNTER — Other Ambulatory Visit: Payer: Self-pay

## 2021-05-30 DIAGNOSIS — R293 Abnormal posture: Secondary | ICD-10-CM | POA: Diagnosis not present

## 2021-05-30 DIAGNOSIS — Z170 Estrogen receptor positive status [ER+]: Secondary | ICD-10-CM

## 2021-05-30 DIAGNOSIS — Z483 Aftercare following surgery for neoplasm: Secondary | ICD-10-CM

## 2021-05-30 DIAGNOSIS — I890 Lymphedema, not elsewhere classified: Secondary | ICD-10-CM

## 2021-05-30 NOTE — Therapy (Signed)
?OUTPATIENT PHYSICAL THERAPY TREATMENT NOTE ? ? ?Patient Name: Pamela [name] ?MRN: [id number] ?DOB:15-Dec-1965, 56 y.o., female ?Today's Date: 05/30/2021 ? ?PCP: [name], MD ?REFERRING PROVIDER: Deanne Coffer* ? ? PT End of Session - 05/30/21 1209   ? ? Visit Number 7   ? Number of Visits 13   ? Date for PT Re-Evaluation 07/04/21   ? Authorization Type UHC   ? PT Start Time 1114   pt arrived late from work  ? PT Stop Time 5277   ? PT Time Calculation (min) 50 min   ? Activity Tolerance Patient tolerated treatment well   ? Behavior During Therapy [facility] for tasks assessed/performed   ? ?  ?  ? ?  ? ? ? ? ?Past Medical History:  ?Diagnosis Date  ? Anxiety   ? Asthma   ? Back pain   ? Breast cancer ([place])   ? Constipation   ? DUB (dysfunctional uterine bleeding) 2007  ? Edema of both lower extremities   ? Elevated cholesterol   ? Family history of breast cancer 05/12/2020  ? Family history of lung cancer 05/12/2020  ? Food allergy   ? H/O menorrhagia 05/01/2006  ? High cholesterol   ? History of ovarian cyst 2007  ? Hypertension 03/31/2005  ? Increased BMI 07/11/2006  ? Joint pain   ? Migraine   ? N&V (nausea and vomiting)   ? Obesity 2007  ? Obstructive sleep apnea syndrome, moderate 10/27/2013  ? no cpap  ? Sleep apnea   ? SOB (shortness of breath)   ? Vitamin D deficiency   ? Vitamin D deficiency   ? Weight loss 07/11/2006  ? ?Past Surgical History:  ?Procedure Laterality Date  ? BREAST LUMPECTOMY WITH RADIOACTIVE SEED AND SENTINEL LYMPH NODE BIOPSY Right 06/02/2020  ? Procedure: RIGHT BREAST LUMPECTOMY WITH RADIOACTIVE SEED AND RIGHT SENTINEL LYMPH NODE BIOPSY;  Surgeon: [name], MD;  Location: [facility];  Service: General;  Laterality: Right;  ? BREAST REDUCTION SURGERY  05/1991  ? CESAREAN SECTION    ? HYSTEROSCOPY  05/01/2006  ? PORTACATH PLACEMENT Right 06/02/2020  ? Procedure: INSERTION PORT-A-CATH;  Surgeon: [name], MD;  Location: [facility];   Service: General;  Laterality: Right;  ? ?Patient Active Problem List  ? Diagnosis Date Noted  ? Prediabetes 03/13/2021  ? Non-intractable vomiting 07/13/2020  ? Port-A-Cath in place 07/08/2020  ? Genetic testing 05/20/2020  ? Family history of breast cancer 05/12/2020  ? Family history of lung cancer 05/12/2020  ? Malignant neoplasm of lower-outer quadrant of right breast of female, estrogen receptor positive ([place]) 05/03/2020  ? Elevated troponin level not due myocardial infarction 09/18/2019  ? Mixed hyperlipidemia 09/18/2019  ? Hypertensive emergency 09/17/2019  ? Primary osteoarthritis of right hip 08/06/2018  ? Seasonal allergies 06/12/2018  ? Fibrocystic disease of breast 05/27/2018  ? Obstructive sleep apnea syndrome, moderate 10/27/2013  ? Shift work sleep disorder 10/27/2013  ? Psychic factors associated with diseases classified elsewhere 08/07/2013  ? High cholesterol 07/21/2013  ? Essential hypertension 07/21/2013  ? Snoring 07/21/2013  ? IUD contraception-inserted 10/01/11 10/01/2011  ? Class 3 severe obesity due to excess calories with serious comorbidity and body mass index (BMI) of 45.0 to 49.9 in adult [facility]) 09/03/2011  ? ? ?REFERRING DIAG: Rt breast cancer ? ?THERAPY DIAG:  ?Abnormal posture ? ?Aftercare following surgery for neoplasm ? ?Malignant neoplasm of lower-outer quadrant of right breast of female, estrogen receptor positive ([facility]) ? ?  Lymphedema, not elsewhere classified ? ?PERTINENT HISTORY: PERTINENT HISTORY:  ?Pt had a right lumpectomy with SLNB on 06/02/2020 ith 0/1 LN. she had chemo with taxol from 5/6 - 10/21/2020, and radiation 11/23/2020-12/20/2020. Cancer was in situ and IDC Gr 3 Her 2+, ER+, PR- with ki 67 of 80%  ? ?PRECAUTIONS: PRECAUTIONS: lymphedema Rt UE and breast ?  ?WEIGHT BEARING RESTRICTIONS No ? ?SUBJECTIVE: My Rt breast was a little sore over the weekend so I've been doing the self MLD more and it's helped some. The swelling has been better up until the past day or so.   ? ?PAIN:  ?Are you having pain? Yes ?NPRS scale: 4/10 ?Pain location: Rt breast ?Pain orientation: Right  ?PAIN TYPE: sore ?Pain description: constant ?Aggravating factors: not sure ?Relieving factors: self MLD has been helping ? ? ? ?TODAY'S TREATMENT ?05/30/21: ?Manual Therapy ?MLD: In Supine: Short neck, 5 diaphragmatic breaths, Lt axillary and Rt inguinal nodes, anterior inter-axillary and Rt axillo-inguinal nodes, then focused on Rt breast, also in Lt S/L for lateral aspect of breast redirecting to axillo-inguinal and posterior inter-axillary anastomosis, then finished retracing all steps in supine. ?P/ROM: In Supine to Rt shoulder into flexion, abduction, and D2 for better stretch at scar tissue in axilla ?MFR: To Rt axilla over scar tissue ? ?05/23/21: ?Manual Therapy ?MLD: In Supine: Short neck, 5 diaphragmatic breaths, Lt axillary and Rt inguinal nodes, anterior inter-axillary and Rt axillo-inguinal nodes, then focused on Rt breast, also in Lt S/L for lateral aspect of breast redirecting to axillo-inguinal and posterior inter-axillary anastomosis, then finished retracing all steps in supine reviewing with pt while performing with vcs, tcs, and hand over hand instruction in supine and had pt return demo.  ?P/ROM: In Supine to Rt shoulder into flexion and abduction for better stretch at scar tissue in axilla ?MFR: To Rt axilla over scar tissue ? ?05/09/21: ?Reviewed final [facility] order which was faxed today ?MLD: With pt permission: In Supine: Short neck, 5 diaphragmatic breaths, Bil axillary and Rt inguinal nodes, anterior inter-axillary and Rt axillo-inguinal nodes, then focused on Rt breast, also in Lt S/L for lateral aspect of breast redirecting to axillo-inguinal and posterior inter-axillary anastomosis, then finished retracing all steps in supine beginning to instruct pt while performing. Gave pt handout on self massage with vcs, tcs, and hand over hand instruction in supine. See below for handout.    ?P/ROM: In Supine to Rt shoulder into flexion and D2 for better stretch at scar tissue in axilla ?MFR: To Rt axilla over scar tissue ? ? ? ? ?  ?ASSESSMENT:  ?CLINICAL IMPRESSION: ?Cont with MLD to Rt breast and MFR to Rt axilla where still mildly palpably tight. Improvement noted in Rt breast in being softer with less areas of fibrosis. ?  ?REHAB POTENTIAL: Excellent ?  ?CLINICAL DECISION MAKING: Stable/uncomplicated ?  ?  ?  ?GOALS: ?Goals reviewed with patient? Yes ?  ?SHORT TERM GOALS: ?  ?STG Name Target Date Goal status  ?1 Pt will be educated on use of compression bra with foam inserts ?Baseline:  04/25/2021 MET  ?         ?         ?         ?         ?         ?         ?  ?LONG TERM GOALS:  ?  ?LTG Name Target Date Goal status  ?1 Pt  will be ind with self MLD for the Rt breast ?Baseline: 05/23/2021 Progressing  ?2 Pt will improve Rt shoulder AROM into abduction to at least 160 ?Baseline:135 05/23/2021 Progressing  ?3 Pt will be ind with final HEP ?Baseline: 05/23/2021 Progressing  ?4        ?5        ?6        ?7        ?  ?PLAN: ?PT FREQUENCY: 1x/week ?  ?PT DURATION: 6 weeks ?  ?PLANNED INTERVENTIONS: Therapeutic exercises, Patient/Family education, Orthotic/Fit training, Prosthetic training, DME instructions, Manual lymph drainage, scar mobilization, and Taping ?  ?PLAN FOR NEXT SESSION: Cont Rt breast MLD having pt return demo (used handout per below),include Rt shoulder PROM and upper quadrant release to improve AROM ?  ? ?Collie Siad, PTA ?05/30/21 12:10 PM ? ? ?Manual Lymph Drainage for Right Breast. ?Do daily. Do slowly. ?Use flat hands with just enough pressure to stretch the skin. ?Do not slide over the skin, stretch the skin with the hand. (Stretch ? Relax ? Move) ?Lie down or sit comfortably (in a recliner, for example) to do this. ?1. Hug yourself: cross arms and do circles at collar bones near neck 5-7 times (to ?wake up? lots of lymph nodes in this area). ?2. Take slow deep breaths,  allowing your belly to balloon out as you breathe in, 5x (to ?wake  ?up? abdominal lymph nodes). ?3. Both armpits--stretch skin in small circles to stimulate intact lymph nodes there, 5-7x. ?4. Right groin area, at panty l

## 2021-06-06 ENCOUNTER — Encounter: Payer: Self-pay | Admitting: Rehabilitation

## 2021-06-06 ENCOUNTER — Ambulatory Visit: Payer: 59 | Attending: Adult Health | Admitting: Rehabilitation

## 2021-06-06 DIAGNOSIS — C50511 Malignant neoplasm of lower-outer quadrant of right female breast: Secondary | ICD-10-CM | POA: Diagnosis present

## 2021-06-06 DIAGNOSIS — I890 Lymphedema, not elsewhere classified: Secondary | ICD-10-CM | POA: Insufficient documentation

## 2021-06-06 DIAGNOSIS — Z170 Estrogen receptor positive status [ER+]: Secondary | ICD-10-CM | POA: Insufficient documentation

## 2021-06-06 DIAGNOSIS — M79622 Pain in left upper arm: Secondary | ICD-10-CM | POA: Insufficient documentation

## 2021-06-06 DIAGNOSIS — Z483 Aftercare following surgery for neoplasm: Secondary | ICD-10-CM | POA: Insufficient documentation

## 2021-06-06 DIAGNOSIS — R293 Abnormal posture: Secondary | ICD-10-CM | POA: Insufficient documentation

## 2021-06-06 DIAGNOSIS — M79621 Pain in right upper arm: Secondary | ICD-10-CM | POA: Diagnosis present

## 2021-06-06 DIAGNOSIS — R600 Localized edema: Secondary | ICD-10-CM | POA: Insufficient documentation

## 2021-06-06 NOTE — Therapy (Signed)
?OUTPATIENT PHYSICAL THERAPY TREATMENT NOTE ? ? ?Patient Name: Pamela [name] ?MRN: [id number] ?DOB:1965-07-01, 56 y.o., female ?Today's Date: 06/06/2021 ? ?PCP: [name], MD ?REFERRING PROVIDER: Deanne Coffer* ? ? PT End of Session - 06/06/21 1021   ? ? Visit Number 8   ? Number of Visits 13   ? Date for PT Re-Evaluation 07/04/21   ? PT Start Time 1025   arrives late  ? PT Stop Time 1100   ? PT Time Calculation (min) 35 min   ? Activity Tolerance Patient tolerated treatment well   ? Behavior During Therapy [facility] for tasks assessed/performed   ? ?  ?  ? ?  ? ? ? ? ? ?Past Medical History:  ?Diagnosis Date  ? Anxiety   ? Asthma   ? Back pain   ? Breast cancer ([place])   ? Constipation   ? DUB (dysfunctional uterine bleeding) 2007  ? Edema of both lower extremities   ? Elevated cholesterol   ? Family history of breast cancer 05/12/2020  ? Family history of lung cancer 05/12/2020  ? Food allergy   ? H/O menorrhagia 05/01/2006  ? High cholesterol   ? History of ovarian cyst 2007  ? Hypertension 03/31/2005  ? Increased BMI 07/11/2006  ? Joint pain   ? Migraine   ? N&V (nausea and vomiting)   ? Obesity 2007  ? Obstructive sleep apnea syndrome, moderate 10/27/2013  ? no cpap  ? Sleep apnea   ? SOB (shortness of breath)   ? Vitamin D deficiency   ? Vitamin D deficiency   ? Weight loss 07/11/2006  ? ?Past Surgical History:  ?Procedure Laterality Date  ? BREAST LUMPECTOMY WITH RADIOACTIVE SEED AND SENTINEL LYMPH NODE BIOPSY Right 06/02/2020  ? Procedure: RIGHT BREAST LUMPECTOMY WITH RADIOACTIVE SEED AND RIGHT SENTINEL LYMPH NODE BIOPSY;  Surgeon: [name], MD;  Location: [place];  Service: General;  Laterality: Right;  ? BREAST REDUCTION SURGERY  05/1991  ? CESAREAN SECTION    ? HYSTEROSCOPY  05/01/2006  ? PORTACATH PLACEMENT Right 06/02/2020  ? Procedure: INSERTION PORT-A-CATH;  Surgeon: [name], MD;  Location: [place];  Service: General;  Laterality: Right;   ? ?Patient Active Problem List  ? Diagnosis Date Noted  ? Prediabetes 03/13/2021  ? Non-intractable vomiting 07/13/2020  ? Port-A-Cath in place 07/08/2020  ? Genetic testing 05/20/2020  ? Family history of breast cancer 05/12/2020  ? Family history of lung cancer 05/12/2020  ? Malignant neoplasm of lower-outer quadrant of right breast of female, estrogen receptor positive ([place]) 05/03/2020  ? Elevated troponin level not due myocardial infarction 09/18/2019  ? Mixed hyperlipidemia 09/18/2019  ? Hypertensive emergency 09/17/2019  ? Primary osteoarthritis of right hip 08/06/2018  ? Seasonal allergies 06/12/2018  ? Fibrocystic disease of breast 05/27/2018  ? Obstructive sleep apnea syndrome, moderate 10/27/2013  ? Shift work sleep disorder 10/27/2013  ? Psychic factors associated with diseases classified elsewhere 08/07/2013  ? High cholesterol 07/21/2013  ? Essential hypertension 07/21/2013  ? Snoring 07/21/2013  ? IUD contraception-inserted 10/01/11 10/01/2011  ? Class 3 severe obesity due to excess calories with serious comorbidity and body mass index (BMI) of 45.0 to 49.9 in adult [facility]) 09/03/2011  ? ? ?REFERRING DIAG: Rt breast cancer ? ?THERAPY DIAG:  ?Abnormal posture ? ?Aftercare following surgery for neoplasm ? ?Malignant neoplasm of lower-outer quadrant of right breast of female, estrogen receptor positive ([place]) ? ?Lymphedema, not elsewhere classified ? ?Localized edema ? ?  Pain in left upper arm ? ?Pain in right upper arm ? ?PERTINENT HISTORY: PERTINENT HISTORY:  ?Pt had a right lumpectomy with SLNB on 06/02/2020 ith 0/1 LN. she had chemo with taxol from 5/6 - 10/21/2020, and radiation 11/23/2020-12/20/2020. Cancer was in situ and IDC Gr 3 Her 2+, ER+, PR- with ki 67 of 80%  ? ?PRECAUTIONS: PRECAUTIONS: lymphedema Rt UE and breast ?  ?WEIGHT BEARING RESTRICTIONS No ? ?SUBJECTIVE: I got my sleeve.  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 ?PAIN:  ?Are you having pain? Yes ?NPRS scale: 4/10 ?Pain location: Rt breast ?Pain  orientation: Right  ?PAIN TYPE: sore ?Pain description: constant ?Aggravating factors: not sure ?Relieving factors: self MLD has been helping ? ? ? ?TODAY'S TREATMENT ?06/06/21 ?Pt got her sleeve - sent to work office somehow ?MLD with pt permission: In Supine: Short neck, 5 diaphragmatic breaths, Lt axillary and Rt inguinal nodes, anterior inter-axillary and Rt axillo-inguinal nodes, then focused more on Rt UE today with Rt breast included due to more c/o hand and arm discomfort, then finished retracing all steps. ?P/ROM: In Supine to Rt shoulder into flexion, abduction, and D2 for better stretch at scar tissue in axilla ? ? ?05/30/21: ?Manual Therapy ?MLD: In Supine: Short neck, 5 diaphragmatic breaths, Lt axillary and Rt inguinal nodes, anterior inter-axillary and Rt axillo-inguinal nodes, then focused on Rt breast, also in Lt S/L for lateral aspect of breast redirecting to axillo-inguinal and posterior inter-axillary anastomosis, then finished retracing all steps in supine. ?P/ROM: In Supine to Rt shoulder into flexion, abduction, and D2 for better stretch at scar tissue in axilla ?MFR: To Rt axilla over scar tissue ? ?05/23/21: ?Manual Therapy ?MLD: In Supine: Short neck, 5 diaphragmatic breaths, Lt axillary and Rt inguinal nodes, anterior inter-axillary and Rt axillo-inguinal nodes, then focused on Rt breast, also in Lt S/L for lateral aspect of breast redirecting to axillo-inguinal and posterior inter-axillary anastomosis, then finished retracing all steps in supine reviewing with pt while performing with vcs, tcs, and hand over hand instruction in supine and had pt return demo.  ?P/ROM: In Supine to Rt shoulder into flexion and abduction for better stretch at scar tissue in axilla ?MFR: To Rt axilla over scar tissue ? ?ASSESSMENT:  ?CLINICAL IMPRESSION: ?Cont with MLD to Rt breast and MFR to Rt axilla where still mildly palpably tight. Improvement noted in Rt breast in being softer with less areas of fibrosis. ?   ?REHAB POTENTIAL: Excellent ?  ?CLINICAL DECISION MAKING: Stable/uncomplicated ?  ?  ?  ?GOALS: ?Goals reviewed with patient? Yes ?  ?SHORT TERM GOALS: ?  ?STG Name Target Date Goal status  ?1 Pt will be educated on use of compression bra with foam inserts ?Baseline:  04/25/2021 MET  ?         ?         ?         ?         ?         ?         ?  ?LONG TERM GOALS:  ?  ?LTG Name Target Date Goal status  ?1 Pt will be ind with self MLD for the Rt breast ?Baseline: 05/23/2021 Progressing  ?2 Pt will improve Rt shoulder AROM into abduction to at least 160 ?Baseline:135 05/23/2021 Progressing  ?3 Pt will be ind with final HEP ?Baseline: 05/23/2021 Progressing  ?4        ?5        ?  6        ?7        ?  ?PLAN: ?PT FREQUENCY: 1x/week ?  ?PT DURATION: 6 weeks ?  ?PLANNED INTERVENTIONS: Therapeutic exercises, Patient/Family education, Orthotic/Fit training, Prosthetic training, DME instructions, Manual lymph drainage, scar mobilization, and Taping ?  ?PLAN FOR NEXT SESSION: Cont Rt breast MLD having pt return demo (used handout per below),include Rt shoulder PROM and upper quadrant release to improve AROM ?  ? ?Shan Levans, PT ?06/06/21 2:16 PM ? ? ?Manual Lymph Drainage for Right Breast. ?Do daily. Do slowly. ?Use flat hands with just enough pressure to stretch the skin. ?Do not slide over the skin, stretch the skin with the hand. (Stretch ? Relax ? Move) ?Lie down or sit comfortably (in a recliner, for example) to do this. ?1. Hug yourself: cross arms and do circles at collar bones near neck 5-7 times (to ?wake up? lots of lymph nodes in this area). ?2. Take slow deep breaths, allowing your belly to balloon out as you breathe in, 5x (to ?wake  ?up? abdominal lymph nodes). ?3. Both armpits--stretch skin in small circles to stimulate intact lymph nodes there, 5-7x. ?4. Right groin area, at panty line--stretch skin in small circles to stimulate lymph nodes 5-7x. ?5. Redirect fluid from right chest toward left armpit (stretch skin  starting at right chest in 3-4 spots working toward left armpit) 3-4x across the chest. ?6. Redirect fluid from right armpit toward right groin (cup your hand around the curve of your rightside and do 3-4 ?pumps?

## 2021-06-07 ENCOUNTER — Other Ambulatory Visit: Payer: Self-pay | Admitting: Adult Health

## 2021-06-07 DIAGNOSIS — C50511 Malignant neoplasm of lower-outer quadrant of right female breast: Secondary | ICD-10-CM

## 2021-06-09 ENCOUNTER — Ambulatory Visit: Payer: 59

## 2021-06-09 ENCOUNTER — Other Ambulatory Visit: Payer: 59

## 2021-06-13 ENCOUNTER — Ambulatory Visit: Payer: 59

## 2021-06-13 ENCOUNTER — Other Ambulatory Visit: Payer: Self-pay

## 2021-06-13 DIAGNOSIS — E782 Mixed hyperlipidemia: Secondary | ICD-10-CM

## 2021-06-13 DIAGNOSIS — R7303 Prediabetes: Secondary | ICD-10-CM

## 2021-06-16 ENCOUNTER — Inpatient Hospital Stay: Payer: 59

## 2021-06-16 ENCOUNTER — Other Ambulatory Visit: Payer: Self-pay

## 2021-06-16 ENCOUNTER — Inpatient Hospital Stay: Payer: 59 | Attending: Hematology and Oncology

## 2021-06-16 VITALS — BP 137/69 | HR 67 | Temp 98.6°F | Resp 20 | Wt 306.5 lb

## 2021-06-16 DIAGNOSIS — E782 Mixed hyperlipidemia: Secondary | ICD-10-CM

## 2021-06-16 DIAGNOSIS — R232 Flushing: Secondary | ICD-10-CM | POA: Insufficient documentation

## 2021-06-16 DIAGNOSIS — Z5112 Encounter for antineoplastic immunotherapy: Secondary | ICD-10-CM | POA: Diagnosis present

## 2021-06-16 DIAGNOSIS — F39 Unspecified mood [affective] disorder: Secondary | ICD-10-CM | POA: Insufficient documentation

## 2021-06-16 DIAGNOSIS — Z95828 Presence of other vascular implants and grafts: Secondary | ICD-10-CM

## 2021-06-16 DIAGNOSIS — D6481 Anemia due to antineoplastic chemotherapy: Secondary | ICD-10-CM | POA: Diagnosis not present

## 2021-06-16 DIAGNOSIS — C50511 Malignant neoplasm of lower-outer quadrant of right female breast: Secondary | ICD-10-CM

## 2021-06-16 DIAGNOSIS — T451X5A Adverse effect of antineoplastic and immunosuppressive drugs, initial encounter: Secondary | ICD-10-CM | POA: Diagnosis not present

## 2021-06-16 DIAGNOSIS — R7303 Prediabetes: Secondary | ICD-10-CM

## 2021-06-16 DIAGNOSIS — Z79899 Other long term (current) drug therapy: Secondary | ICD-10-CM | POA: Insufficient documentation

## 2021-06-16 DIAGNOSIS — R454 Irritability and anger: Secondary | ICD-10-CM | POA: Insufficient documentation

## 2021-06-16 DIAGNOSIS — Z170 Estrogen receptor positive status [ER+]: Secondary | ICD-10-CM | POA: Insufficient documentation

## 2021-06-16 LAB — CBC WITH DIFFERENTIAL (CANCER CENTER ONLY)
Abs Immature Granulocytes: 0.03 10*3/uL (ref 0.00–0.07)
Basophils Absolute: 0 10*3/uL (ref 0.0–0.1)
Basophils Relative: 1 %
Eosinophils Absolute: 0.2 10*3/uL (ref 0.0–0.5)
Eosinophils Relative: 4 %
HCT: 34.3 % — ABNORMAL LOW (ref 36.0–46.0)
Hemoglobin: 10.9 g/dL — ABNORMAL LOW (ref 12.0–15.0)
Immature Granulocytes: 1 %
Lymphocytes Relative: 23 %
Lymphs Abs: 1.3 10*3/uL (ref 0.7–4.0)
MCH: 25.6 pg — ABNORMAL LOW (ref 26.0–34.0)
MCHC: 31.8 g/dL (ref 30.0–36.0)
MCV: 80.5 fL (ref 80.0–100.0)
Monocytes Absolute: 0.6 10*3/uL (ref 0.1–1.0)
Monocytes Relative: 10 %
Neutro Abs: 3.7 10*3/uL (ref 1.7–7.7)
Neutrophils Relative %: 61 %
Platelet Count: 205 10*3/uL (ref 150–400)
RBC: 4.26 MIL/uL (ref 3.87–5.11)
RDW: 15.3 % (ref 11.5–15.5)
WBC Count: 5.9 10*3/uL (ref 4.0–10.5)
nRBC: 0 % (ref 0.0–0.2)

## 2021-06-16 LAB — CMP (CANCER CENTER ONLY)
ALT: 19 U/L (ref 0–44)
AST: 19 U/L (ref 15–41)
Albumin: 4 g/dL (ref 3.5–5.0)
Alkaline Phosphatase: 55 U/L (ref 38–126)
Anion gap: 5 (ref 5–15)
BUN: 17 mg/dL (ref 6–20)
CO2: 32 mmol/L (ref 22–32)
Calcium: 9.2 mg/dL (ref 8.9–10.3)
Chloride: 103 mmol/L (ref 98–111)
Creatinine: 0.9 mg/dL (ref 0.44–1.00)
GFR, Estimated: >60 mL/min (ref >60)
Glucose, Bld: 104 mg/dL — ABNORMAL HIGH (ref 70–99)
Potassium: 3.5 mmol/L (ref 3.5–5.1)
Sodium: 140 mmol/L (ref 135–145)
Total Bilirubin: 0.6 mg/dL (ref 0.3–1.2)
Total Protein: 7 g/dL (ref 6.5–8.1)

## 2021-06-16 MED ORDER — HEPARIN SOD (PORK) LOCK FLUSH 100 UNIT/ML IV SOLN
500.0000 [IU] | Freq: Once | INTRAVENOUS | Status: AC | PRN
  Administered 2021-06-16: 500 [IU]

## 2021-06-16 MED ORDER — SODIUM CHLORIDE 0.9 % IV SOLN: Freq: Once | INTRAVENOUS | Status: AC

## 2021-06-16 MED ORDER — DIPHENHYDRAMINE HCL 50 MG/ML IJ SOLN
50.0000 mg | Freq: Once | INTRAMUSCULAR | Status: AC
  Administered 2021-06-16: 50 mg via INTRAVENOUS
  Filled 2021-06-16: qty 1

## 2021-06-16 MED ORDER — SODIUM CHLORIDE 0.9% FLUSH
10.0000 mL | INTRAVENOUS | Status: DC | PRN
  Administered 2021-06-16: 10 mL

## 2021-06-16 MED ORDER — SODIUM CHLORIDE 0.9% FLUSH
10.0000 mL | Freq: Once | INTRAVENOUS | Status: AC
  Administered 2021-06-16: 10 mL

## 2021-06-16 MED ORDER — ACETAMINOPHEN 325 MG PO TABS
650.0000 mg | ORAL_TABLET | Freq: Once | ORAL | Status: AC
  Administered 2021-06-16: 650 mg via ORAL
  Filled 2021-06-16: qty 2

## 2021-06-16 MED ORDER — CYANOCOBALAMIN 1000 MCG/ML IJ SOLN
1000.0000 ug | Freq: Once | INTRAMUSCULAR | Status: AC
  Administered 2021-06-16: 1000 ug via INTRAMUSCULAR
  Filled 2021-06-16: qty 1

## 2021-06-16 MED ORDER — TRASTUZUMAB-ANNS CHEMO 150 MG IV SOLR
900.0000 mg | Freq: Once | INTRAVENOUS | Status: AC
  Administered 2021-06-16: 900 mg via INTRAVENOUS
  Filled 2021-06-16: qty 42.86

## 2021-06-16 NOTE — Patient Instructions (Signed)
[facility]  Discharge Instructions: ?Thank you for choosing [facility] to provide your oncology and hematology care.  ? ?If you have a lab appointment with the [facility], please go directly to the [facility] and check in at the registration area. ?  ?Wear comfortable clothing and clothing appropriate for easy access to any Portacath or PICC line.  ? ?We strive to give you quality time with your provider. You may need to reschedule your appointment if you arrive late (15 or more minutes).  Arriving late affects you and other patients whose appointments are after yours.  Also, if you miss three or more appointments without notifying the office, you may be dismissed from the clinic at the provider?s discretion.    ?  ?For prescription refill requests, have your pharmacy contact our office and allow 72 hours for refills to be completed.   ? ?Today you received the following chemotherapy and/or immunotherapy agents: [place]i  ?  ?To help prevent nausea and vomiting after your treatment, we encourage you to take your nausea medication as directed. ? ?BELOW ARE SYMPTOMS THAT SHOULD BE REPORTED IMMEDIATELY: ?*FEVER GREATER THAN 100.4 F (38 ?C) OR HIGHER ?*CHILLS OR SWEATING ?*NAUSEA AND VOMITING THAT IS NOT CONTROLLED WITH YOUR NAUSEA MEDICATION ?*UNUSUAL SHORTNESS OF BREATH ?*UNUSUAL BRUISING OR BLEEDING ?*URINARY PROBLEMS (pain or burning when urinating, or frequent urination) ?*BOWEL PROBLEMS (unusual diarrhea, constipation, pain near the anus) ?TENDERNESS IN MOUTH AND THROAT WITH OR WITHOUT PRESENCE OF ULCERS (sore throat, sores in mouth, or a toothache) ?UNUSUAL RASH, SWELLING OR PAIN  ?UNUSUAL VAGINAL DISCHARGE OR ITCHING  ? ?Items with * indicate a potential emergency and should be followed up as soon as possible or go to the Emergency Department if any problems should occur. ? ?Please show the CHEMOTHERAPY ALERT CARD or IMMUNOTHERAPY ALERT CARD at check-in to the  Emergency Department and triage nurse. ? ?Should you have questions after your visit or need to cancel or reschedule your appointment, please contact [facility]  Dept: [phone]  and follow the prompts.  Office hours are 8:00 a.m. to 4:30 p.m. Monday - Friday. Please note that voicemails left after 4:00 p.m. may not be returned until the following business day.  We are closed weekends and major holidays. You have access to a nurse at all times for urgent questions. Please call the main number to the clinic Dept: [phone] and follow the prompts. ? ? ?For any non-urgent questions, you may also contact your provider using MyChart. We now offer e-Visits for anyone 70 and older to request care online for non-urgent symptoms. For details visit [email or web address]. ?  ?Also download the MyChart app! Go to the app store, search "MyChart", open the app, select [name], and log in with your MyChart username and password. ? ?Due to Covid, a mask is required upon entering the hospital/clinic. If you do not have a mask, one will be given to you upon arrival. For doctor visits, patients may have 1 support person aged 75 or older with them. For treatment visits, patients cannot have anyone with them due to current Covid guidelines and our immunocompromised population.  ? ?Vitamin B12 Injection ?What is this medication? ?Vitamin B12 (VAHY tuh min B12) prevents and treats low vitamin B12 levels in your body. It is used in people who do not get enough vitamin B12 from their diet or when their digestive tract does not absorb enough. Vitamin B12 plays an  important role in maintaining the health of your nervous system and red blood cells. ?This medicine may be used for other purposes; ask your health care provider or pharmacist if you have questions. ?COMMON BRAND NAME(S): B-12 Compliance Kit, B-12 Injection Kit, Cyomin, Dodex, LA-12, Nutri-Twelve, Physicians EZ Use B-12, Primabalt ?What  should I tell my care team before I take this medication? ?They need to know if you have any of these conditions: ?Kidney disease ?Leber's disease ?Megaloblastic anemia ?An unusual or allergic reaction to cyanocobalamin, cobalt, other medications, foods, dyes, or preservatives ?Pregnant or trying to get pregnant ?Breast-feeding ?How should I use this medication? ?This medication is injected into a muscle or deeply under the skin. It is usually given in a clinic or care team's office. However, your care team may teach you how to inject yourself. Follow all instructions. ?Talk to your care team about the use of this medication in children. Special care may be needed. ?Overdosage: If you think you have taken too much of this medicine contact a poison control center or emergency room at once. ?NOTE: This medicine is only for you. Do not share this medicine with others. ?What if I miss a dose? ?If you are given your dose at a clinic or care team's office, call to reschedule your appointment. If you give your own injections, and you miss a dose, take it as soon as you can. If it is almost time for your next dose, take only that dose. Do not take double or extra doses. ?What may interact with this medication? ?Colchicine ?Heavy alcohol intake ?This list may not describe all possible interactions. Give your health care provider a list of all the medicines, herbs, non-prescription drugs, or dietary supplements you use. Also tell them if you smoke, drink alcohol, or use illegal drugs. Some items may interact with your medicine. ?What should I watch for while using this medication? ?Visit your care team regularly. You may need blood work done while you are taking this medication. ?You may need to follow a special diet. Talk to your care team. Limit your alcohol intake and avoid smoking to get the best benefit. ?What side effects may I notice from receiving this medication? ?Side effects that you should report to your care team  as soon as possible: ?Allergic reactions--skin rash, itching, hives, swelling of the face, lips, tongue, or throat ?Swelling of the ankles, hands, or feet ?Trouble breathing ?Side effects that usually do not require medical attention (report to your care team if they continue or are bothersome): ?Diarrhea ?This list may not describe all possible side effects. Call your doctor for medical advice about side effects. You may report side effects to FDA at 1-800-FDA-1088. ?Where should I keep my medication? ?Keep out of the reach of children. ?Store at room temperature between 15 and 30 degrees C (59 and 85 degrees F). Protect from light. Throw away any unused medication after the expiration date. ?NOTE: This sheet is a summary. It may not cover all possible information. If you have questions about this medicine, talk to your doctor, pharmacist, or health care provider. ?? 2022 Elsevier/Gold Standard (2020-05-04 00:00:00) ? ?

## 2021-06-17 LAB — HEMOGLOBIN A1C
Est. average glucose Bld gHb Est-mCnc: 126 mg/dL
Hgb A1c MFr Bld: 6 % — ABNORMAL HIGH (ref 4.8–5.6)

## 2021-06-17 LAB — LIPID PANEL
Chol/HDL Ratio: 3.7 ratio (ref 0.0–4.4)
Cholesterol, Total: 170 mg/dL (ref 100–199)
HDL: 46 mg/dL (ref >39)
LDL Chol Calc (NIH): 110 mg/dL — ABNORMAL HIGH (ref 0–99)
Triglycerides: 74 mg/dL (ref 0–149)
VLDL Cholesterol Cal: 14 mg/dL (ref 5–40)

## 2021-06-17 LAB — SPECIMEN STATUS REPORT

## 2021-06-20 ENCOUNTER — Ambulatory Visit: Payer: 59 | Admitting: Rehabilitation

## 2021-06-20 ENCOUNTER — Encounter: Payer: Self-pay | Admitting: Rehabilitation

## 2021-06-20 DIAGNOSIS — Z170 Estrogen receptor positive status [ER+]: Secondary | ICD-10-CM

## 2021-06-20 DIAGNOSIS — Z483 Aftercare following surgery for neoplasm: Secondary | ICD-10-CM

## 2021-06-20 DIAGNOSIS — R293 Abnormal posture: Secondary | ICD-10-CM | POA: Diagnosis not present

## 2021-06-20 DIAGNOSIS — I890 Lymphedema, not elsewhere classified: Secondary | ICD-10-CM

## 2021-06-20 NOTE — Therapy (Signed)
?OUTPATIENT PHYSICAL THERAPY TREATMENT NOTE ? ? ?Patient Name: Pamela [name] ?MRN: [id number] ?DOB:06-15-1965, 56 y.o., female ?Today's Date: 06/20/2021 ? ?PCP: [name], MD ?REFERRING PROVIDER: Deanne Coffer* ? ? PT End of Session - 06/20/21 1011   ? ? Visit Number 9   ? Number of Visits 13   ? Date for PT Re-Evaluation 07/04/21   ? PT Start Time 4098   arrived late  ? PT Stop Time 1055   ? PT Time Calculation (min) 40 min   ? Activity Tolerance Patient tolerated treatment well   ? Behavior During Therapy [facility] for tasks assessed/performed   ? ?  ?  ? ?  ? ? ? ? ? ? ?Past Medical History:  ?Diagnosis Date  ? Anxiety   ? Asthma   ? Back pain   ? Breast cancer ([place])   ? Constipation   ? DUB (dysfunctional uterine bleeding) 2007  ? Edema of both lower extremities   ? Elevated cholesterol   ? Family history of breast cancer 05/12/2020  ? Family history of lung cancer 05/12/2020  ? Food allergy   ? H/O menorrhagia 05/01/2006  ? High cholesterol   ? History of ovarian cyst 2007  ? Hypertension 03/31/2005  ? Increased BMI 07/11/2006  ? Joint pain   ? Migraine   ? N&V (nausea and vomiting)   ? Obesity 2007  ? Obstructive sleep apnea syndrome, moderate 10/27/2013  ? no cpap  ? Sleep apnea   ? SOB (shortness of breath)   ? Vitamin D deficiency   ? Vitamin D deficiency   ? Weight loss 07/11/2006  ? ?Past Surgical History:  ?Procedure Laterality Date  ? BREAST LUMPECTOMY WITH RADIOACTIVE SEED AND SENTINEL LYMPH NODE BIOPSY Right 06/02/2020  ? Procedure: RIGHT BREAST LUMPECTOMY WITH RADIOACTIVE SEED AND RIGHT SENTINEL LYMPH NODE BIOPSY;  Surgeon: [name], MD;  Location: [facility];  Service: General;  Laterality: Right;  ? BREAST REDUCTION SURGERY  05/1991  ? CESAREAN SECTION    ? HYSTEROSCOPY  05/01/2006  ? PORTACATH PLACEMENT Right 06/02/2020  ? Procedure: INSERTION PORT-A-CATH;  Surgeon: [name], MD;  Location: [facility];  Service: General;  Laterality: Right;   ? ?Patient Active Problem List  ? Diagnosis Date Noted  ? Prediabetes 03/13/2021  ? Non-intractable vomiting 07/13/2020  ? Port-A-Cath in place 07/08/2020  ? Genetic testing 05/20/2020  ? Family history of breast cancer 05/12/2020  ? Family history of lung cancer 05/12/2020  ? Malignant neoplasm of lower-outer quadrant of right breast of female, estrogen receptor positive ([place]) 05/03/2020  ? Elevated troponin level not due myocardial infarction 09/18/2019  ? Mixed hyperlipidemia 09/18/2019  ? Hypertensive emergency 09/17/2019  ? Primary osteoarthritis of right hip 08/06/2018  ? Seasonal allergies 06/12/2018  ? Fibrocystic disease of breast 05/27/2018  ? Obstructive sleep apnea syndrome, moderate 10/27/2013  ? Shift work sleep disorder 10/27/2013  ? Psychic factors associated with diseases classified elsewhere 08/07/2013  ? High cholesterol 07/21/2013  ? Essential hypertension 07/21/2013  ? Snoring 07/21/2013  ? IUD contraception-inserted 10/01/11 10/01/2011  ? Class 3 severe obesity due to excess calories with serious comorbidity and body mass index (BMI) of 45.0 to 49.9 in adult [facility]) 09/03/2011  ? ? ?REFERRING DIAG: Rt breast cancer ? ?THERAPY DIAG:  ?Abnormal posture ? ?Aftercare following surgery for neoplasm ? ?Malignant neoplasm of lower-outer quadrant of right breast of female, estrogen receptor positive ([place]) ? ?Lymphedema, not elsewhere classified ? ?PERTINENT HISTORY:  PERTINENT HISTORY:  ?Pt had a right lumpectomy with SLNB on 06/02/2020 ith 0/1 LN. she had chemo with taxol from 5/6 - 10/21/2020, and radiation 11/23/2020-12/20/2020. Cancer was in situ and IDC Gr 3 Her 2+, ER+, PR- with ki 67 of 80%  ? ?PRECAUTIONS: PRECAUTIONS: lymphedema Rt UE and breast ?  ?WEIGHT BEARING RESTRICTIONS No ? ?SUBJECTIVE: I think the sleeve will fit fine.  It is a little tight but fits  ? ?PAIN:  ?Are you having pain? Yes ?NPRS scale: 4/10 ?Pain location: Rt breast ?Pain orientation: Right  ?PAIN TYPE: sore ?Pain description:  constant ?Aggravating factors: not sure ?Relieving factors: self MLD has been helping ? ? ? ?TODAY'S TREATMENT ?06/20/21 ?Pt got her sleeve - sent to work office somehow ?MLD with pt permission: In Supine: Short neck, 5 diaphragmatic breaths, Lt axillary and Rt inguinal nodes, anterior inter-axillary and Rt axillo-inguinal nodes, then focused more on Rt UE today with Rt breast included due to more c/o hand and arm discomfort, then finished retracing all steps. ?P/ROM: In Supine to Rt shoulder into flexion, abduction, and D2 for better stretch at scar tissue in axilla ? ? ?06/06/21 ?Pt got her sleeve - sent to work office somehow ?MLD with pt permission: In Supine: Short neck, 5 diaphragmatic breaths, Lt axillary and Rt inguinal nodes, anterior inter-axillary and Rt axillo-inguinal nodes, then focused more on Rt UE today with Rt breast included due to more c/o hand and arm discomfort, then finished retracing all steps. ?P/ROM: In Supine to Rt shoulder into flexion, abduction, and D2 for better stretch at scar tissue in axilla ? ? ?05/30/21: ?Manual Therapy ?MLD: In Supine: Short neck, 5 diaphragmatic breaths, Lt axillary and Rt inguinal nodes, anterior inter-axillary and Rt axillo-inguinal nodes, then focused on Rt breast, also in Lt S/L for lateral aspect of breast redirecting to axillo-inguinal and posterior inter-axillary anastomosis, then finished retracing all steps in supine. ?P/ROM: In Supine to Rt shoulder into flexion, abduction, and D2 for better stretch at scar tissue in axilla ?MFR: To Rt axilla over scar tissue ? ? ?ASSESSMENT:  ?CLINICAL IMPRESSION: ?Cont with MLD to Rt breast and MFR to Rt axilla where still mildly palpably tight. Improvement noted in Rt breast in being softer with less areas of fibrosis. ?  ?REHAB POTENTIAL: Excellent ?  ?CLINICAL DECISION MAKING: Stable/uncomplicated ?  ?  ?  ?GOALS: ?Goals reviewed with patient? Yes ?  ?SHORT TERM GOALS: ?  ?STG Name Target Date Goal status  ?1 Pt will  be educated on use of compression bra with foam inserts ?Baseline:  04/25/2021 MET  ?         ?         ?         ?         ?         ?         ?  ?LONG TERM GOALS:  ?  ?LTG Name Target Date Goal status  ?1 Pt will be ind with self MLD for the Rt breast ?Baseline: 05/23/2021 Progressing  ?2 Pt will improve Rt shoulder AROM into abduction to at least 160 ?Baseline:135 05/23/2021 Progressing  ?3 Pt will be ind with final HEP ?Baseline: 05/23/2021 Progressing  ?4        ?5        ?6        ?7        ?  ?PLAN: ?PT FREQUENCY: 1x/week ?  ?PT  DURATION: 6 weeks ?  ?PLANNED INTERVENTIONS: Therapeutic exercises, Patient/Family education, Orthotic/Fit training, Prosthetic training, DME instructions, Manual lymph drainage, scar mobilization, and Taping ?  ?PLAN FOR NEXT SESSION: Cont Rt breast MLD having pt return demo (used handout per below),include Rt shoulder PROM and upper quadrant release to improve AROM ?  ? ?Shan Levans, PT ?06/20/21 10:55 AM ? ? ?Manual Lymph Drainage for Right Breast. ?Do daily. Do slowly. ?Use flat hands with just enough pressure to stretch the skin. ?Do not slide over the skin, stretch the skin with the hand. (Stretch ? Relax ? Move) ?Lie down or sit comfortably (in a recliner, for example) to do this. ?1. Hug yourself: cross arms and do circles at collar bones near neck 5-7 times (to ?wake up? lots of lymph nodes in this area). ?2. Take slow deep breaths, allowing your belly to balloon out as you breathe in, 5x (to ?wake  ?up? abdominal lymph nodes). ?3. Both armpits--stretch skin in small circles to stimulate intact lymph nodes there, 5-7x. ?4. Right groin area, at panty line--stretch skin in small circles to stimulate lymph nodes 5-7x. ?5. Redirect fluid from right chest toward left armpit (stretch skin starting at right chest in 3-4 spots working toward left armpit) 3-4x across the chest. ?6. Redirect fluid from right armpit toward right groin (cup your hand around the curve of your rightside and do  3-4 ?pumps? from armpit to groin) 3-4x down your side. ?7. Draw an imaginary diagonal line from upper outer breast through the nipple area toward lower inner breast. Direct fluid upward and inward from [facility]

## 2021-06-21 ENCOUNTER — Encounter: Payer: 59 | Admitting: Internal Medicine

## 2021-06-22 ENCOUNTER — Other Ambulatory Visit: Payer: 59

## 2021-06-22 ENCOUNTER — Ambulatory Visit (HOSPITAL_BASED_OUTPATIENT_CLINIC_OR_DEPARTMENT_OTHER)
Admission: RE | Admit: 2021-06-22 | Discharge: 2021-06-22 | Disposition: A | Discharge status: Home / Self Care | Payer: 59 | Source: Ambulatory Visit | Attending: Internal Medicine | Admitting: Internal Medicine

## 2021-06-22 ENCOUNTER — Encounter (HOSPITAL_COMMUNITY): Payer: Self-pay | Admitting: Internal Medicine

## 2021-06-22 ENCOUNTER — Other Ambulatory Visit: Payer: Self-pay

## 2021-06-22 ENCOUNTER — Ambulatory Visit (HOSPITAL_COMMUNITY)
Admission: RE | Admit: 2021-06-22 | Discharge: 2021-06-22 | Disposition: A | Discharge status: Home / Self Care | Payer: 59 | Source: Ambulatory Visit | Attending: Internal Medicine | Admitting: Internal Medicine

## 2021-06-22 VITALS — BP 118/70 | HR 70 | Wt 306.4 lb

## 2021-06-22 DIAGNOSIS — Z79899 Other long term (current) drug therapy: Secondary | ICD-10-CM | POA: Diagnosis not present

## 2021-06-22 DIAGNOSIS — I10 Essential (primary) hypertension: Secondary | ICD-10-CM

## 2021-06-22 DIAGNOSIS — I358 Other nonrheumatic aortic valve disorders: Secondary | ICD-10-CM | POA: Diagnosis not present

## 2021-06-22 DIAGNOSIS — I427 Cardiomyopathy due to drug and external agent: Secondary | ICD-10-CM

## 2021-06-22 DIAGNOSIS — C50511 Malignant neoplasm of lower-outer quadrant of right female breast: Secondary | ICD-10-CM | POA: Insufficient documentation

## 2021-06-22 DIAGNOSIS — E785 Hyperlipidemia, unspecified: Secondary | ICD-10-CM | POA: Diagnosis not present

## 2021-06-22 DIAGNOSIS — R0982 Postnasal drip: Secondary | ICD-10-CM

## 2021-06-22 DIAGNOSIS — I509 Heart failure, unspecified: Secondary | ICD-10-CM

## 2021-06-22 DIAGNOSIS — T451X5A Adverse effect of antineoplastic and immunosuppressive drugs, initial encounter: Secondary | ICD-10-CM

## 2021-06-22 DIAGNOSIS — Z170 Estrogen receptor positive status [ER+]: Secondary | ICD-10-CM

## 2021-06-22 DIAGNOSIS — G4733 Obstructive sleep apnea (adult) (pediatric): Secondary | ICD-10-CM | POA: Diagnosis not present

## 2021-06-22 DIAGNOSIS — I119 Hypertensive heart disease without heart failure: Secondary | ICD-10-CM | POA: Insufficient documentation

## 2021-06-22 DIAGNOSIS — Z01818 Encounter for other preprocedural examination: Secondary | ICD-10-CM | POA: Insufficient documentation

## 2021-06-22 DIAGNOSIS — G4730 Sleep apnea, unspecified: Secondary | ICD-10-CM | POA: Insufficient documentation

## 2021-06-22 DIAGNOSIS — R0989 Other specified symptoms and signs involving the circulatory and respiratory systems: Secondary | ICD-10-CM

## 2021-06-22 LAB — ECHOCARDIOGRAM COMPLETE
AR max vel: 2.09 cm2
AV Peak grad: 11.7 mmHg
Ao pk vel: 1.71 m/s
Area-P 1/2: 4.01 cm2
Calc EF: 54 %
S' Lateral: 3.7 cm
Single Plane A2C EF: 50.7 %
Single Plane A4C EF: 53.2 %

## 2021-06-22 NOTE — Progress Notes (Signed)
? ?CARDIO-ONCOLOGY CLINIC NOTE ? ?Referring Physician: Dr. Lindi Adie ?Primary Care: [name], MD ?Primary Cardiologist: Dr. Gwenlyn Found ? ?HPI:  Pamela [name] is 56 y.o. female with obesity, HTN, HL and right breast cancer  diagnosed referred by Dr. Lindi Adie  for enrollment into the Cardio-Oncology program. ? ? ?Malignant neoplasm of lower-outer quadrant of right breast of female, estrogen receptor positive ([place])  ?05/03/2020 Initial Diagnosis  ?  Screening mammogram showed a 1.0cm mass at the 6 o'clock position in the right breast. Diagnostic mammogram and US showed the 1.2cm mass at the 6 o'clock position in the right breast. Biopsy showed IDC, grade 3, HER-2 positive (3+), ER 15% weak, PR-, Ki67 80%. ?   ?05/11/2020 Cancer Staging  ?  Staging form: Breast, AJCC 8th Edition ?- Clinical stage from 05/11/2020: Stage IA (cT1b, cN0, cM0, G3, ER+, PR-, HER2+) - Signed by Nicholas Lose, MD on 05/11/2020 ?Stage prefix: Initial diagnosis ?   ?05/19/2020 Genetic Testing  ?  Negative hereditary cancer genetic testing: no pathogenic variants detected in [facility] Breast STAT Panel and Common Hereditary Cancers Panel.  The report dates are May 19, 2020 (STAT) and May 23, 2020 (Common Hereditary).  ?  ?The Common Hereditary Cancers Panel offered by [facility] includes sequencing and/or deletion duplication testing of the following 47 genes: APC, ATM, AXIN2, BARD1, BMPR1A, BRCA1, BRCA2, BRIP1, CDH1, CDK4, CDKN2A (p14ARF), CDKN2A (p16INK4a), CHEK2, CTNNA1, DICER1, EPCAM (Deletion/duplication testing only), GREM1 (promoter region deletion/duplication testing only), GREM1, HOXB13, KIT, MEN1, MLH1, MSH2, MSH3, MSH6, MUTYH, NBN, NF1, NHTL1, PALB2, PDGFRA, PMS2, POLD1, POLE, PTEN, RAD50, RAD51C, RAD51D, SDHA, SDHB, SDHC, SDHD, SMAD4, SMARCA4. STK11, TP53, TSC1, TSC2, and VHL.  The following genes were evaluated for sequence changes only: SDHA and HOXB13 c.251G>A variant only.es only: SDHA and HOXB13 c.251G>A variant only. ?   ?06/02/2020 Surgery  ?   Right lumpectomy ([place]): invasive and in situ ductal carcinoma, 1.3cm, clear margins, 1 right axillary lymph node negative for carcinoma. ?   ?06/02/2020 Cancer Staging  ?  Staging form: Breast, AJCC 8th Edition ?- Pathologic stage from 06/02/2020: Stage IA (pT1c, pN0, cM0, G3, ER+, PR-, HER2+) - Signed by Gardenia Phlegm, NP on 06/15/2020 ?Stage prefix: Initial diagnosis ?Histologic grading system: 3 grade system ?   ?07/08/2020 - 10/21/2020 Chemotherapy  ?  Taxol Herceptin followed by Herceptin maintenance ?   ?11/11/2020 -  Chemotherapy  ?  Herceptin maintenance ?  ?  ?  ?   ?  ?  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 Denies known heart disease. ? ?Diagnosed with HER-2+ breast CA in 3/22. Treated with lumpectomy in 3/22. Then Taxol/Herceptin 12 rounds from 5/22-8/22. Now on Herceptin q 3 weeks. Finished XRT in 10/22.  ? ?Doing well. Tolerating Herceptin well. Mild DOE. No edema, orthopnea or PND. + snoring. Has scheduled sleep study but didn't get device. Daytime somnolence. Recently started weight loss clinic ? ?Echo today EF 55% GLS -19.9% ? ?Echo1/17/22 Echo 55-60%  Mod LVH G2DD GLS 15.0% Personally reviewed ? ? ?Echo 05/20/20: EF 60-65% GLS -21.2% ?Echo 08/30/20: EF 60-65% GLS - 20.7% ?Echo 10/22: EF 60-65% GLS reported as -13.3%  ? ? ?Past Medical History:  ?Diagnosis Date  ? Anxiety   ? Asthma   ? Back pain   ? Breast cancer ([place])   ? Constipation   ? DUB (dysfunctional uterine bleeding) 2007  ? Edema of both lower extremities   ? Elevated cholesterol   ? Family history of breast cancer 05/12/2020  ? Family  history of lung cancer 05/12/2020  ? Food allergy   ? H/O menorrhagia 05/01/2006  ? High cholesterol   ? History of ovarian cyst 2007  ? Hypertension 03/31/2005  ? Increased BMI 07/11/2006  ? Joint pain   ? Migraine   ? N&V (nausea and vomiting)   ? Obesity 2007  ? Obstructive sleep apnea syndrome, moderate 10/27/2013  ? no cpap  ? Sleep apnea   ? SOB (shortness of breath)   ? Vitamin D  deficiency   ? Vitamin D deficiency   ? Weight loss 07/11/2006  ? ? ?Current Outpatient Medications  ?Medication Sig Dispense Refill  ? amLODipine (NORVASC) 10 MG tablet Take 1 tablet (10 mg total) by mouth daily. 90 tablet 2  ? aspirin EC 81 MG tablet Take 1 tablet (81 mg total) by mouth daily. Swallow whole. 30 tablet 11  ? Azilsartan-Chlorthalidone (EDARBYCLOR) 40-25 MG TABS Take 1 tablet by mouth daily. 90 tablet 2  ? hydrOXYzine (ATARAX) 25 MG tablet Take 1 tablet by mouth every 12 (twelve) hours as needed.    ? levonorgestrel (MIRENA, 52 MG,) 20 MCG/DAY IUD 1 each by Intrauterine route once.    ? lidocaine-prilocaine (EMLA) cream Apply 1 application topically daily as needed. 60 g 0  ? OVER THE COUNTER MEDICATION Take 1 capsule by mouth daily. Medi weight loss enteric coated omega 3    ? OVER THE COUNTER MEDICATION Take 1 capsule by mouth daily. Medi weight loss Calcium four blend    ? OVER THE COUNTER MEDICATION Take 1 capsule by mouth daily. Medi weight loss fat burner    ? OVER THE COUNTER MEDICATION Take 1 capsule by mouth daily. Medi weight loss vita super    ? rosuvastatin (CRESTOR) 40 MG tablet Take 1 tablet (40 mg total) by mouth daily. 90 tablet 3  ? tamoxifen (NOLVADEX) 20 MG tablet Take 10 mg by mouth daily.    ? phentermine 37.5 MG capsule Take 1 capsule (37.5 mg total) by mouth every morning. (Patient not taking: Reported on 06/22/2021)    ? VITAMIN D, CHOLECALCIFEROL, PO Take 1 tablet by mouth daily. (Patient not taking: Reported on 06/22/2021)    ? ?Current Facility-Administered Medications  ?Medication Dose Route Frequency Provider Last Rate Last Admin  ? 0.9 %  sodium chloride infusion   Intravenous PRN Bary Castilla, NP      ? ?Facility-Administered Medications Ordered in Other Encounters  ?Medication Dose Route Frequency Provider Last Rate Last Admin  ? heparin lock flush 100 unit/mL  500 Units Intracatheter Once Nicholas Lose, MD      ? sodium chloride flush (NS) 0.9 % injection 10 mL   10 mL Intracatheter Once Nicholas Lose, MD      ? ? ?Allergies  ?Allergen Reactions  ? Pollen Extract Shortness Of Breath  ? Latex Rash  ? Codeine Hives  ? Shellfish Allergy Hives  ? Betadine [Povidone Iodine] Rash  ? ? ?  ?Social History  ? ?Socioeconomic History  ? Marital status: Single  ?  Spouse name: Not on file  ? Number of children: Not on file  ? Years of education: Not on file  ? Highest education level: Not on file  ?Occupational History  ? Occupation: Education officer, museum  ?Tobacco Use  ? Smoking status: Never  ? Smokeless tobacco: Never  ?Vaping Use  ? Vaping Use: Never used  ?Substance and Sexual Activity  ? Alcohol use: No  ? Drug use: Not Currently  ?  Frequency:  2.0 times per week  ? Sexual activity: Not Currently  ?  Birth control/protection: I.U.D.  ?  Comment: Mirena  ?Other Topics Concern  ? Not on file  ?Social History Narrative  ? Not on file  ? ?Social Determinants of Health  ? ?Financial Resource Strain: Not on file  ?Food Insecurity: Not on file  ?Transportation Needs: Not on file  ?Physical Activity: Not on file  ?Stress: Not on file  ?Social Connections: Not on file  ?Intimate Partner Violence: Not on file  ? ? ?  ?Family History  ?Problem Relation Age of Onset  ? Hypertension Mother   ? High Cholesterol Mother   ? Thyroid disease Mother   ? Cancer Mother   ? Sleep apnea Mother   ? Hypertension Father   ? Heart attack Father   ? Sudden death Father   ? Diabetes Maternal Grandmother   ? Bone cancer Maternal Grandfather   ?     dx after 50  ? Breast cancer Maternal Aunt   ?     dx 58s  ? Lung cancer Maternal Aunt   ?     dx after 50  ? ? ?Vitals:  ? 06/22/21 0942  ?BP: 118/70  ?Pulse: 70  ?SpO2: 97%  ?Weight: (!) 139 kg (306 lb 6.4 oz)  ? ?Wt Readings from Last 3 Encounters:  ?06/22/21 (!) 139 kg (306 lb 6.4 oz)  ?06/16/21 (!) 139 kg (306 lb 8 oz)  ?05/26/21 (!) 139.8 kg (308 lb 1.6 oz)  ?  ?PHYSICAL EXAM: ?General:  Well appearing. No resp difficulty ?HEENT: normal ?Neck: supple. no JVD. Carotids  2+ bilat; no bruits. No lymphadenopathy or thryomegaly appreciated. ?Cor: PMI nondisplaced. Regular rate & rhythm. No rubs, gallops or murmurs. ?Lungs: clear ?Abdomen: obese soft, nontender, nondistended. No he

## 2021-06-22 NOTE — Patient Instructions (Signed)
There has been no changes to you medications. ? ?The PIN for your sleep study is 1234 ? ?Your physician has requested that you have an echocardiogram. Echocardiography is a painless test that uses sound waves to create images of your heart. It provides your doctor with information about the size and shape of your heart and how well your heart?s chambers and valves are working. This procedure takes approximately one hour. There are no restrictions for this procedure. ? ?Your physician recommends that you schedule a follow-up appointment in: 2-3 months with an echocardiogram ? ?If you have any questions or concerns before your next appointment please send [place] a message through [place] or call our office at [phone].   ? ?TO LEAVE A MESSAGE FOR THE NURSE SELECT OPTION 2, PLEASE LEAVE A MESSAGE INCLUDING: ?YOUR NAME ?DATE OF BIRTH ?CALL BACK NUMBER ?REASON FOR CALL**this is important as we prioritize the call backs ? ?YOU WILL RECEIVE A CALL BACK THE SAME DAY AS LONG AS YOU CALL BEFORE 4:00 PM ? ?At the [facility], you and your health needs are our priority. As part of our continuing mission to provide you with exceptional heart care, we have created designated Provider Care Teams. These Care Teams include your primary Cardiologist (physician) and Advanced Practice Providers (APPs- Physician Assistants and Nurse Practitioners) who all work together to provide you with the care you need, when you need it.  ? ?You may see any of the following providers on your designated Care Team at your next follow up: ?Dr Glori Bickers ?Dr Loralie Champagne ?Darrick Grinder, NP ?Lyda Jester, PA ?Jessica Milford,NP ?Marlyce Huge, PA ?Audry Riles, PharmD ? ? ?Please be sure to bring in all your medications bottles to every appointment.  ? ? ?

## 2021-06-22 NOTE — Progress Notes (Signed)
Error

## 2021-06-23 ENCOUNTER — Other Ambulatory Visit: Payer: Self-pay

## 2021-06-23 ENCOUNTER — Encounter (HOSPITAL_COMMUNITY): Payer: Self-pay | Admitting: Internal Medicine

## 2021-06-23 DIAGNOSIS — R0982 Postnasal drip: Secondary | ICD-10-CM

## 2021-06-23 DIAGNOSIS — R0989 Other specified symptoms and signs involving the circulatory and respiratory systems: Secondary | ICD-10-CM

## 2021-06-23 LAB — NOVEL CORONAVIRUS, NAA: SARS-CoV-2, NAA: NOT DETECTED

## 2021-06-23 MED ORDER — LEVOCETIRIZINE DIHYDROCHLORIDE 5 MG PO TABS: 5.0000 mg | ORAL_TABLET | Freq: Every evening | ORAL | 0 refills | Status: DC

## 2021-06-23 MED ORDER — MONTELUKAST SODIUM 10 MG PO TABS: 10.0000 mg | ORAL_TABLET | Freq: Every day | ORAL | 0 refills | Status: DC

## 2021-06-23 NOTE — Progress Notes (Signed)
? ?Patient Care Team: ?[name], MD as PCP - General (Internal Medicine) ?Mcarthur Rossetti, MD as Consulting Physician (Orthopedic Surgery) ?Mauro Kaufmann, RN as Oncology Nurse Navigator ?Rockwell Germany, RN as Oncology Nurse Navigator ?[name], MD as Consulting Physician (General Surgery) ?Nicholas Lose, MD as Consulting Physician (Hematology and Oncology) ?Eppie Gibson, MD as Attending Physician (Radiation Oncology) ? ?DIAGNOSIS:  ?Encounter Diagnosis  ?Name Primary?  ? Malignant neoplasm of lower-outer quadrant of right breast of female, estrogen receptor positive ([place])   ? ? ?SUMMARY OF ONCOLOGIC HISTORY: ?Oncology History  ?Malignant neoplasm of lower-outer quadrant of right breast of female, estrogen receptor positive ([place])  ?05/03/2020 Initial Diagnosis  ? Screening mammogram showed a 1.0cm mass at the 6 o'clock position in the right breast. Diagnostic mammogram and US showed the 1.2cm mass at the 6 o'clock position in the right breast. Biopsy showed IDC, grade 3, HER-2 positive (3+), ER 15% weak, PR-, Ki67 80%. ?  ?05/11/2020 Cancer Staging  ? Staging form: Breast, AJCC 8th Edition ?- Clinical stage from 05/11/2020: Stage IA (cT1b, cN0, cM0, G3, ER+, PR-, HER2+) - Signed by Nicholas Lose, MD on 05/11/2020 ?Stage prefix: Initial diagnosis ? ?  ?05/19/2020 Genetic Testing  ? Negative hereditary cancer genetic testing: no pathogenic variants detected in [facility] Breast STAT Panel and Common Hereditary Cancers Panel.  The report dates are May 19, 2020 (STAT) and May 23, 2020 (Common Hereditary).  ? ?The Common Hereditary Cancers Panel offered by [facility] includes sequencing and/or deletion duplication testing of the following 47 genes: APC, ATM, AXIN2, BARD1, BMPR1A, BRCA1, BRCA2, BRIP1, CDH1, CDK4, CDKN2A (p14ARF), CDKN2A (p16INK4a), CHEK2, CTNNA1, DICER1, EPCAM (Deletion/duplication testing only), GREM1 (promoter region deletion/duplication testing only), GREM1, HOXB13, KIT, MEN1, MLH1,  MSH2, MSH3, MSH6, MUTYH, NBN, NF1, NHTL1, PALB2, PDGFRA, PMS2, POLD1, POLE, PTEN, RAD50, RAD51C, RAD51D, SDHA, SDHB, SDHC, SDHD, SMAD4, SMARCA4. STK11, TP53, TSC1, TSC2, and VHL.  The following genes were evaluated for sequence changes only: SDHA and HOXB13 c.251G>A variant only.es only: SDHA and HOXB13 c.251G>A variant only. ?  ?06/02/2020 Surgery  ? Right lumpectomy ([facility]): invasive and in situ ductal carcinoma, 1.3cm, clear margins, 1 right axillary lymph node negative for carcinoma. ?  ?06/02/2020 Cancer Staging  ? Staging form: Breast, AJCC 8th Edition ?- Pathologic stage from 06/02/2020: Stage IA (pT1c, pN0, cM0, G3, ER+, PR-, HER2+) - Signed by Gardenia Phlegm, NP on 06/15/2020 ?Stage prefix: Initial diagnosis ?Histologic grading system: 3 grade system ? ?  ?07/08/2020 - 10/21/2020 Chemotherapy  ? Taxol Herceptin followed by Herceptin maintenance ?  ?11/11/2020 -  Chemotherapy  ? Herceptin maintenance ? ?  ? ?  ?04/14/2021 -  Anti-estrogen oral therapy  ? Tamoxifen 10 mg ?  ? ? ?CHIEF COMPLIANT:  Herceptin maintenance ? ?INTERVAL HISTORY: DEWANA AMMIRATI is a 56 y.o. with above-mentioned history of breast cancer treated with lumpectomy, and is currently on  Herceptin. maintenance. She reports to the clinic today for follow-up.  She is tolerating Herceptin extremely well.  She has developed muscle aches and pains and stiffness in the hands. ? ? ?ALLERGIES:  is allergic to pollen extract, latex, codeine, shellfish allergy, and betadine [povidone iodine]. ? ?MEDICATIONS:  ?Current Outpatient Medications  ?Medication Sig Dispense Refill  ? amLODipine (NORVASC) 10 MG tablet Take 1 tablet (10 mg total) by mouth daily. 90 tablet 2  ? aspirin EC 81 MG tablet Take 1 tablet (81 mg total) by mouth daily. Swallow whole. 30 tablet 11  ? Azilsartan-Chlorthalidone (EDARBYCLOR) 40-25  MG TABS Take 1 tablet by mouth daily. 90 tablet 2  ? hydrOXYzine (ATARAX) 25 MG tablet Take 1 tablet by mouth every 12 (twelve) hours as  needed.    ? levocetirizine (XYZAL) 5 MG tablet Take 1 tablet (5 mg total) by mouth every evening. 90 tablet 0  ? levonorgestrel (MIRENA, 52 MG,) 20 MCG/DAY IUD 1 each by Intrauterine route once.    ? lidocaine-prilocaine (EMLA) cream Apply 1 application topically daily as needed. 60 g 0  ? montelukast (SINGULAIR) 10 MG tablet Take 1 tablet (10 mg total) by mouth daily. 90 tablet 0  ? OVER THE COUNTER MEDICATION Take 1 capsule by mouth daily. Medi weight loss enteric coated omega 3    ? OVER THE COUNTER MEDICATION Take 1 capsule by mouth daily. Medi weight loss Calcium four blend    ? OVER THE COUNTER MEDICATION Take 1 capsule by mouth daily. Medi weight loss fat burner    ? OVER THE COUNTER MEDICATION Take 1 capsule by mouth daily. Medi weight loss vita super    ? rosuvastatin (CRESTOR) 40 MG tablet Take 1 tablet (40 mg total) by mouth daily. 90 tablet 3  ? Semaglutide-Weight Management (WEGOVY) 0.5 MG/0.5ML SOAJ Inject 0.5 mg into the skin once a week. 2 mL 0  ? tamoxifen (NOLVADEX) 20 MG tablet Take 10 mg by mouth daily.    ? VITAMIN D, CHOLECALCIFEROL, PO Take 1 tablet by mouth daily.    ? ?Current Facility-Administered Medications  ?Medication Dose Route Frequency Provider Last Rate Last Admin  ? 0.9 %  sodium chloride infusion   Intravenous PRN Bary Castilla, NP      ? ?Facility-Administered Medications Ordered in Other Visits  ?Medication Dose Route Frequency Provider Last Rate Last Admin  ? heparin lock flush 100 unit/mL  500 Units Intracatheter Once Nicholas Lose, MD      ? sodium chloride flush (NS) 0.9 % injection 10 mL  10 mL Intracatheter Once Nicholas Lose, MD      ? ? ?PHYSICAL EXAMINATION: ?ECOG PERFORMANCE STATUS: 1 - Symptomatic but completely ambulatory ? ?Vitals:  ? 07/07/21 0902  ?BP: (!) 165/76  ?Pulse: 83  ?Resp: 18  ?Temp: 97.8 ?F (36.6 ?C)  ?SpO2: 100%  ? ?Filed Weights  ? 07/07/21 0902  ?Weight: (!) 312 lb 3.2 oz (141.6 kg)  ?  ? ?LABORATORY DATA:  ?I have reviewed the data as  listed ? ?  Latest Ref Rng & Units 06/16/2021  ?  8:51 AM 05/26/2021  ?  8:37 AM 05/05/2021  ?  8:24 AM  ?CMP  ?Glucose 70 - 99 mg/dL 104   111   105    ?BUN 6 - 20 mg/dL _0 ?Creatinine 0.44 - 1.00 mg/dL 0.90   0.86   0.95    ?Sodium 135 - 145 mmol/L 140   142   141    ?Potassium 3.5 - 5.1 mmol/L 3.5   3.5   3.4    ?Chloride 98 - 111 mmol/L 103   106   103    ?CO2 22 - 32 mmol/L 32   31   31    ?Calcium 8.9 - 10.3 mg/dL 9.2   9.1   9.6    ?Total Protein 6.5 - 8.1 g/dL 7.0   6.8   7.1    ?Total Bilirubin 0.3 - 1.2 mg/dL 0.6   0.7   0.9    ?Alkaline Phos 38 -  126 U/L 55   57   61    ?AST 15 - 41 U/L _0 ?ALT 0 - 44 U/L _1 ? ? ?Lab Results  ?Component Value Date  ? WBC 6.1 07/07/2021  ? HGB 10.5 (L) 07/07/2021  ? HCT 32.3 (L) 07/07/2021  ? MCV 80.5 07/07/2021  ? PLT 213 07/07/2021  ? NEUTROABS 3.9 07/07/2021  ? ? ?ASSESSMENT & PLAN:  ?Malignant neoplasm of lower-outer quadrant of right breast of female, estrogen receptor positive ([place]) ?05/03/2020:Screening mammogram showed a 1.0cm mass at the 6 o'clock position in the right breast. Diagnostic mammogram and US showed the 1.2cm mass at the 6 o'clock position in the right breast. Biopsy showed IDC, grade 3, HER-2 positive (3+), ER 15% weak, PR-, Ki67 80%. ?  ?06/02/2020:Right lumpectomy ([facility]): invasive and in situ ductal carcinoma, 1.3cm, clear margins, 1 right axillary lymph node negative for carcinoma. ER 15% weak, PR-,HER-2 positive (3+) Ki67 80%. ?  ?Treatment plan: ?1.  Adjuvant chemotherapy with Taxol Herceptin followed by Herceptin maintenance.  Stopped after 11 cycles of Taxol ? 2.  Adjuvant radiation therapy 11/23/2020-12/19/2020 ?3.  Adjuvant antiestrogen therapy ?----------------------------------------------------------------------------------------------------- ?Current Treatment Herceptin maintenance (today is the last treatment) ?ECHO 08/30/20: EF 60-65% ?Herceptin toxicities: None ?Chemotherapy-induced anemia: Monitoring  hemoglobin today is 10.5 ?  ?01/13/2021: FSH: 28, Estradiol 14.4: not in menopause ?  ?Tamoxifen toxicities: Started 04/14/2021 ?1.  Mood swings and irritability ?2. hot flashes ?3.  Joint stiffness and achiness ?We

## 2021-06-27 ENCOUNTER — Ambulatory Visit: Payer: 59

## 2021-06-27 DIAGNOSIS — I890 Lymphedema, not elsewhere classified: Secondary | ICD-10-CM

## 2021-06-27 DIAGNOSIS — Z483 Aftercare following surgery for neoplasm: Secondary | ICD-10-CM

## 2021-06-27 DIAGNOSIS — R293 Abnormal posture: Secondary | ICD-10-CM

## 2021-06-27 DIAGNOSIS — C50511 Malignant neoplasm of lower-outer quadrant of right female breast: Secondary | ICD-10-CM

## 2021-06-27 NOTE — Therapy (Signed)
?OUTPATIENT PHYSICAL THERAPY TREATMENT NOTE ? ? ?Patient Name: Pamela [name] ?MRN: [id number] ?DOB:1965-11-26, 56 y.o., female ?Today's Date: 06/27/2021 ? ?PCP: [name], MD ?REFERRING PROVIDER: Deanne Coffer* ? ? PT End of Session - 06/27/21 1109   ? ? Visit Number 10   ? Number of Visits 13   ? Date for PT Re-Evaluation 07/04/21   ? PT Start Time 1008   pt arrived late  ? PT Stop Time 1105   ? PT Time Calculation (min) 57 min   ? Activity Tolerance Patient tolerated treatment well   ? Behavior During Therapy [facility] for tasks assessed/performed   ? ?  ?  ? ?  ? ? ? ? ? ? ? ?Past Medical History:  ?Diagnosis Date  ? Anxiety   ? Asthma   ? Back pain   ? Breast cancer ([place])   ? Constipation   ? DUB (dysfunctional uterine bleeding) 2007  ? Edema of both lower extremities   ? Elevated cholesterol   ? Family history of breast cancer 05/12/2020  ? Family history of lung cancer 05/12/2020  ? Food allergy   ? H/O menorrhagia 05/01/2006  ? High cholesterol   ? History of ovarian cyst 2007  ? Hypertension 03/31/2005  ? Increased BMI 07/11/2006  ? Joint pain   ? Migraine   ? N&V (nausea and vomiting)   ? Obesity 2007  ? Obstructive sleep apnea syndrome, moderate 10/27/2013  ? no cpap  ? Sleep apnea   ? SOB (shortness of breath)   ? Vitamin D deficiency   ? Vitamin D deficiency   ? Weight loss 07/11/2006  ? ?Past Surgical History:  ?Procedure Laterality Date  ? BREAST LUMPECTOMY WITH RADIOACTIVE SEED AND SENTINEL LYMPH NODE BIOPSY Right 06/02/2020  ? Procedure: RIGHT BREAST LUMPECTOMY WITH RADIOACTIVE SEED AND RIGHT SENTINEL LYMPH NODE BIOPSY;  Surgeon: [name], MD;  Location: [place];  Service: General;  Laterality: Right;  ? BREAST REDUCTION SURGERY  05/1991  ? CESAREAN SECTION    ? HYSTEROSCOPY  05/01/2006  ? PORTACATH PLACEMENT Right 06/02/2020  ? Procedure: INSERTION PORT-A-CATH;  Surgeon: [name], MD;  Location: [place];  Service: General;  Laterality: Right;   ? ?Patient Active Problem List  ? Diagnosis Date Noted  ? Prediabetes 03/13/2021  ? Non-intractable vomiting 07/13/2020  ? Port-A-Cath in place 07/08/2020  ? Genetic testing 05/20/2020  ? Family history of breast cancer 05/12/2020  ? Family history of lung cancer 05/12/2020  ? Malignant neoplasm of lower-outer quadrant of right breast of female, estrogen receptor positive ([place]) 05/03/2020  ? Elevated troponin level not due myocardial infarction 09/18/2019  ? Mixed hyperlipidemia 09/18/2019  ? Hypertensive emergency 09/17/2019  ? Primary osteoarthritis of right hip 08/06/2018  ? Seasonal allergies 06/12/2018  ? Fibrocystic disease of breast 05/27/2018  ? Obstructive sleep apnea syndrome, moderate 10/27/2013  ? Shift work sleep disorder 10/27/2013  ? Psychic factors associated with diseases classified elsewhere 08/07/2013  ? High cholesterol 07/21/2013  ? Essential hypertension 07/21/2013  ? Snoring 07/21/2013  ? IUD contraception-inserted 10/01/11 10/01/2011  ? Class 3 severe obesity due to excess calories with serious comorbidity and body mass index (BMI) of 45.0 to 49.9 in adult [facility]) 09/03/2011  ? ? ?REFERRING DIAG: Rt breast cancer ? ?THERAPY DIAG:  ?Abnormal posture ? ?Aftercare following surgery for neoplasm ? ?Malignant neoplasm of lower-outer quadrant of right breast of female, estrogen receptor positive ([place]) ? ?Lymphedema, not elsewhere classified ? ?  PERTINENT HISTORY: PERTINENT HISTORY:  ?Pt had a right lumpectomy with SLNB on 06/02/2020 ith 0/1 LN. she had chemo with taxol from 5/6 - 10/21/2020, and radiation 11/23/2020-12/20/2020. Cancer was in situ and IDC Gr 3 Her 2+, ER+, PR- with ki 67 of 80%  ? ?PRECAUTIONS: PRECAUTIONS: lymphedema Rt UE and breast ?  ?WEIGHT BEARING RESTRICTIONS No ? ?SUBJECTIVE: My middle Rt finger has started hurting and my ROM is limited for the past 3-4 days. It feels better when I wear the gauntlet.  ? ?PAIN:  ?Are you having pain? Yes ?NPRS scale: 7/10 ?Pain location: Rt 3rd  finger ?Pain orientation: Right  ?PAIN TYPE: achy ?Pain description: constant ?Aggravating factors: when I move it too far ?Relieving factors: wearing the gauntlet ? ? ? ?TODAY'S TREATMENT ? ?06/27/21 ?MLD with pt permission: In Supine: Short neck, 5 diaphragmatic breaths, Lt axillary and Rt inguinal nodes, anterior inter-axillary and Rt axillo-inguinal nodes, then focused more on Rt UE today with Rt breast included due to more c/o hand especially and arm discomfort, then finished retracing all steps. ?P/ROM: In Supine to Rt shoulder into flexion, abduction, and D2 for better stretch at scar tissue in axilla; gently to Rt 3rd finger ?STM to Rt 3rd finger ? ?06/20/21 ?Pt got her sleeve - sent to work office somehow ?MLD with pt permission: In Supine: Short neck, 5 diaphragmatic breaths, Lt axillary and Rt inguinal nodes, anterior inter-axillary and Rt axillo-inguinal nodes, then focused more on Rt UE today with Rt breast included due to more c/o hand and arm discomfort, then finished retracing all steps. ?P/ROM: In Supine to Rt shoulder into flexion, abduction, and D2 for better stretch at scar tissue in axilla ? ? ?06/06/21 ?Pt got her sleeve - sent to work office somehow ?MLD with pt permission: In Supine: Short neck, 5 diaphragmatic breaths, Lt axillary and Rt inguinal nodes, anterior inter-axillary and Rt axillo-inguinal nodes, then focused more on Rt UE today with Rt breast included due to more c/o hand and arm discomfort, then finished retracing all steps. ?P/ROM: In Supine to Rt shoulder into flexion, abduction, and D2 for better stretch at scar tissue in axilla ? ? ?ASSESSMENT:  ?CLINICAL IMPRESSION: ?Continued with MLD to Rt upper quadrant and spent time focusing on Rt hand and fingers where pt has bene c/o increased pain and limited A/ROM. She reports her finger pain feeling much improved to 5/10 from 7/10 and able to make a fist after session today with no increase in pain. Her breast also continues to show  improvement by fibrosis softening by end of session. Encouraged pt to include UE sequence in self MLD since this helped her pain and she verbalized good understanding.  ?  ?REHAB POTENTIAL: Excellent ?  ?CLINICAL DECISION MAKING: Stable/uncomplicated ?  ?  ?  ?GOALS: ?Goals reviewed with patient? Yes ?  ?SHORT TERM GOALS: ?  ?STG Name Target Date Goal status  ?1 Pt will be educated on use of compression bra with foam inserts ?Baseline:  04/25/2021 MET  ?         ?         ?         ?         ?         ?         ?  ?LONG TERM GOALS:  ?  ?LTG Name Target Date Goal status  ?1 Pt will be ind with self MLD for the Rt breast ?Baseline:  05/23/2021 Progressing  ?2 Pt will improve Rt shoulder AROM into abduction to at least 160 ?Baseline:135 05/23/2021 Progressing  ?3 Pt will be ind with final HEP ?Baseline: 05/23/2021 Progressing  ?4        ?5        ?6        ?7        ?  ?PLAN: ?PT FREQUENCY: 1x/week ?  ?PT DURATION: 6 weeks ?  ?PLANNED INTERVENTIONS: Therapeutic exercises, Patient/Family education, Orthotic/Fit training, Prosthetic training, DME instructions, Manual lymph drainage, scar mobilization, and Taping ?  ?PLAN FOR NEXT SESSION: Cont Rt breast MLD having pt return demo (used handout per below),include Rt shoulder PROM and upper quadrant release to improve AROM; how is Rt middle finger feeling?  ?  ?Collie Siad, PTA ?06/27/21 11:16 AM ? ? ? ?Manual Lymph Drainage for Right Breast. ?Do daily. Do slowly. ?Use flat hands with just enough pressure to stretch the skin. ?Do not slide over the skin, stretch the skin with the hand. (Stretch ? Relax ? Move) ?Lie down or sit comfortably (in a recliner, for example) to do this. ?1. Hug yourself: cross arms and do circles at collar bones near neck 5-7 times (to ?wake up? lots of lymph nodes in this area). ?2. Take slow deep breaths, allowing your belly to balloon out as you breathe in, 5x (to ?wake  ?up? abdominal lymph nodes). ?3. Both armpits--stretch skin in small  circles to stimulate intact lymph nodes there, 5-7x. ?4. Right groin area, at panty line--stretch skin in small circles to stimulate lymph nodes 5-7x. ?5. Redirect fluid from right chest toward left armpit (

## 2021-07-03 ENCOUNTER — Other Ambulatory Visit (HOSPITAL_COMMUNITY): Payer: Self-pay

## 2021-07-03 ENCOUNTER — Encounter: Payer: Self-pay | Admitting: Hematology and Oncology

## 2021-07-03 ENCOUNTER — Encounter: Payer: 59 | Admitting: Internal Medicine

## 2021-07-03 ENCOUNTER — Ambulatory Visit (INDEPENDENT_AMBULATORY_CARE_PROVIDER_SITE_OTHER): Payer: 59 | Admitting: Internal Medicine

## 2021-07-03 ENCOUNTER — Encounter: Payer: Self-pay | Admitting: Internal Medicine

## 2021-07-03 VITALS — BP 132/70 | HR 86 | Temp 98.2°F | Ht 69.0 in | Wt 311.0 lb

## 2021-07-03 DIAGNOSIS — Z0000 Encounter for general adult medical examination without abnormal findings: Secondary | ICD-10-CM

## 2021-07-03 DIAGNOSIS — Z6841 Body Mass Index (BMI) 40.0 and over, adult: Secondary | ICD-10-CM

## 2021-07-03 DIAGNOSIS — H6121 Impacted cerumen, right ear: Secondary | ICD-10-CM

## 2021-07-03 DIAGNOSIS — I10 Essential (primary) hypertension: Secondary | ICD-10-CM | POA: Diagnosis not present

## 2021-07-03 DIAGNOSIS — E7800 Pure hypercholesterolemia, unspecified: Secondary | ICD-10-CM

## 2021-07-03 DIAGNOSIS — Z170 Estrogen receptor positive status [ER+]: Secondary | ICD-10-CM

## 2021-07-03 DIAGNOSIS — R7303 Prediabetes: Secondary | ICD-10-CM | POA: Diagnosis not present

## 2021-07-03 DIAGNOSIS — C50511 Malignant neoplasm of lower-outer quadrant of right female breast: Secondary | ICD-10-CM

## 2021-07-03 MED ORDER — WEGOVY 0.5 MG/0.5ML ~~LOC~~ SOAJ
0.5000 mg | SUBCUTANEOUS | 0 refills | Status: DC
  Filled 2021-07-03 – 2021-07-21 (×2): qty 2, 28d supply, fill #0

## 2021-07-03 NOTE — Patient Instructions (Signed)

## 2021-07-03 NOTE — Progress Notes (Signed)
I,Tianna Badgett,acting as a Education administrator for [name], MD.,have documented all relevant documentation on the behalf of [name], MD,as directed by  [name], MD while in the presence of [name], MD.  This visit occurred during the SARS-CoV-2 public health emergency.  Safety protocols were in place, including screening questions prior to the visit, additional usage of staff PPE, and extensive cleaning of exam room while observing appropriate contact time as indicated for disinfecting solutions.  Subjective:     Patient ID: Pamela [name] , female    DOB: 16-Oct-1965 , 56 y.o.   MRN: [id number]   Chief Complaint  Patient presents with   Annual Exam    HPI  She is here today for a full physical exam. She is followed by [facility]  for her pelvic exams. She would like to decline her EKG due to seeing a cardiologist. She had an ECHO April 2023.  She reports compliance with meds.   She is now going to [facility] in [place].  They have prescribed phentermine. She wants to see if able to consider weekly injection.   Hypertension This is a chronic problem. The current episode started more than 1 year ago. The problem has been gradually improving since onset. The problem is controlled. Pertinent negatives include no blurred vision, chest pain, palpitations or shortness of breath. Risk factors for coronary artery disease include obesity and sedentary lifestyle.    Past Medical History:  Diagnosis Date   Anxiety    Asthma    Back pain    Breast cancer ([place])    Constipation    DUB (dysfunctional uterine bleeding) 2007   Edema of both lower extremities    Elevated cholesterol    Family history of breast cancer 05/12/2020   Family history of lung cancer 05/12/2020   Food allergy    H/O menorrhagia 05/01/2006   High cholesterol    History of ovarian cyst 2007   Hypertension 03/31/2005   Increased BMI 07/11/2006   Joint pain    Migraine     N&V (nausea and vomiting)    Obesity 2007   Obstructive sleep apnea syndrome, moderate 10/27/2013   no cpap   Sleep apnea    SOB (shortness of breath)    Vitamin D deficiency    Vitamin D deficiency    Weight loss 07/11/2006     Family History  Problem Relation Age of Onset   Hypertension Mother    High Cholesterol Mother    Thyroid disease Mother    Cancer Mother    Sleep apnea Mother    Hypertension Father    Heart attack Father    Sudden death Father    Diabetes Maternal Grandmother    Bone cancer Maternal Grandfather        dx after 26   Breast cancer Maternal Aunt        dx 44s   Lung cancer Maternal Aunt        dx after 50     Current Outpatient Medications:    amLODipine (NORVASC) 10 MG tablet, Take 1 tablet (10 mg total) by mouth daily., Disp: 90 tablet, Rfl: 2   aspirin EC 81 MG tablet, Take 1 tablet (81 mg total) by mouth daily. Swallow whole., Disp: 30 tablet, Rfl: 11   Azilsartan-Chlorthalidone (EDARBYCLOR) 40-25 MG TABS, Take 1 tablet by mouth daily., Disp: 90 tablet, Rfl: 2   hydrOXYzine (ATARAX) 25 MG tablet, Take 1 tablet  by mouth every 12 (twelve) hours as needed., Disp: , Rfl:    levocetirizine (XYZAL) 5 MG tablet, Take 1 tablet (5 mg total) by mouth every evening., Disp: 90 tablet, Rfl: 0   levonorgestrel (MIRENA, 52 MG,) 20 MCG/DAY IUD, 1 each by Intrauterine route once., Disp: , Rfl:    lidocaine-prilocaine (EMLA) cream, Apply 1 application topically daily as needed., Disp: 60 g, Rfl: 0   montelukast (SINGULAIR) 10 MG tablet, Take 1 tablet (10 mg total) by mouth daily., Disp: 90 tablet, Rfl: 0   OVER THE COUNTER MEDICATION, Take 1 capsule by mouth daily. Medi weight loss enteric coated omega 3, Disp: , Rfl:    OVER THE COUNTER MEDICATION, Take 1 capsule by mouth daily. Medi weight loss Calcium four blend, Disp: , Rfl:    OVER THE COUNTER MEDICATION, Take 1 capsule by mouth daily. Medi weight loss fat burner, Disp: , Rfl:    OVER THE COUNTER MEDICATION,  Take 1 capsule by mouth daily. Medi weight loss vita super, Disp: , Rfl:    rosuvastatin (CRESTOR) 40 MG tablet, Take 1 tablet (40 mg total) by mouth daily., Disp: 90 tablet, Rfl: 3   Semaglutide-Weight Management (WEGOVY) 0.5 MG/0.5ML SOAJ, Inject 0.5 mg into the skin once a week., Disp: 2 mL, Rfl: 0   tamoxifen (NOLVADEX) 20 MG tablet, Take 10 mg by mouth daily., Disp: , Rfl:    VITAMIN D, CHOLECALCIFEROL, PO, Take 1 tablet by mouth daily., Disp: , Rfl:   Current Facility-Administered Medications:    0.9 %  sodium chloride infusion, , Intravenous, PRN, Ghumman, Ramandeep, NP  Facility-Administered Medications Ordered in Other Visits:    heparin lock flush 100 unit/mL, 500 Units, Intracatheter, Once, Nicholas Lose, MD   sodium chloride flush (NS) 0.9 % injection 10 mL, 10 mL, Intracatheter, Once, Nicholas Lose, MD   Allergies  Allergen Reactions   Pollen Extract Shortness Of Breath   Latex Rash   Codeine Hives   Shellfish Allergy Hives   Betadine [Povidone Iodine] Rash      The patient states she uses IUD for birth control. Last LMP was No LMP recorded. (Menstrual status: IUD).. Negative for Dysmenorrhea. Negative for: breast discharge, breast lump(s), breast pain and breast self exam. Associated symptoms include abnormal vaginal bleeding. Pertinent negatives include abnormal bleeding (hematology), anxiety, decreased libido, depression, difficulty falling sleep, dyspareunia, history of infertility, nocturia, sexual dysfunction, sleep disturbances, urinary incontinence, urinary urgency, vaginal discharge and vaginal itching. Diet regular.The patient states her exercise level is  intermittent.  . The patient's tobacco use is:  Social History   Tobacco Use  Smoking Status Never  Smokeless Tobacco Never  . She has been exposed to passive smoke. The patient's alcohol use is:  Social History   Substance and Sexual Activity  Alcohol Use No    Review of Systems  Constitutional:  Negative.   HENT: Negative.    Eyes: Negative.  Negative for blurred vision.  Respiratory: Negative.  Negative for shortness of breath.   Cardiovascular: Negative.  Negative for chest pain and palpitations.  Gastrointestinal: Negative.   Endocrine: Negative.   Genitourinary: Negative.   Musculoskeletal: Negative.   Skin: Negative.   Allergic/Immunologic: Negative.   Neurological: Negative.   Hematological: Negative.   Psychiatric/Behavioral: Negative.      Today's Vitals   07/03/21 0839  BP: 132/70  Pulse: 86  Temp: 98.2 F (36.8 C)  TempSrc: Oral  Weight: (!) 311 lb (141.1 kg)  Height: '5\' 9"'$  (1.753 m)  Body mass index is 45.93 kg/m.  Wt Readings from Last 3 Encounters:  07/13/21 (!) 305 lb 8 oz (138.6 kg)  07/12/21 (!) 310 lb (140.6 kg)  07/07/21 (!) 312 lb 3.2 oz (141.6 kg)    Objective:  Physical Exam Vitals and nursing note reviewed.  Constitutional:      Appearance: Normal appearance.  HENT:     Head: Normocephalic and atraumatic.     Right Ear: Ear canal and external ear normal. There is impacted cerumen.     Left Ear: Tympanic membrane, ear canal and external ear normal. There is no impacted cerumen.     Nose:     Comments: Masked     Mouth/Throat:     Comments: Masked  Eyes:     Extraocular Movements: Extraocular movements intact.     Conjunctiva/sclera: Conjunctivae normal.     Pupils: Pupils are equal, round, and reactive to light.  Cardiovascular:     Rate and Rhythm: Normal rate and regular rhythm.     Pulses: Normal pulses.     Heart sounds: Normal heart sounds.  Pulmonary:     Effort: Pulmonary effort is normal.     Breath sounds: Normal breath sounds.  Chest:  Breasts:    Tanner Score is 5.     Left: Normal.     Comments: Hyperpigmentation r breast, healed surgical scar Abdominal:     General: Abdomen is flat. Bowel sounds are normal.     Palpations: Abdomen is soft.  Genitourinary:    Comments: deferred Musculoskeletal:         General: Normal range of motion.     Cervical back: Normal range of motion and neck supple.  Skin:    General: Skin is warm and dry.  Neurological:     General: No focal deficit present.     Mental Status: She is alert and oriented to person, place, and time.  Psychiatric:        Mood and Affect: Mood normal.        Behavior: Behavior normal.     Assessment And Plan:     1. Encounter for health maintenance examination in adult Comments: A full exam was performed. Importance of momthly self breast exams was discussed with the patient.  She did not have labs drawn, 06/16/21 results reviewed. PATIENT IS ADVISED TO GET 30-45 MINUTES REGULAR EXERCISE NO LESS THAN FOUR TO FIVE DAYS PER WEEK - BOTH WEIGHTBEARING EXERCISES AND AEROBIC ARE RECOMMENDED.  PATIENT IS ADVISED TO FOLLOW A HEALTHY DIET WITH AT LEAST SIX FRUITS/VEGGIES PER DAY, DECREASE INTAKE OF RED MEAT, AND TO INCREASE FISH INTAKE TO TWO DAYS PER WEEK.  MEATS/FISH SHOULD NOT BE FRIED, BAKED OR BROILED IS PREFERABLE.  IT IS ALSO IMPORTANT TO CUT BACK ON YOUR SUGAR INTAKE. PLEASE AVOID ANYTHING WITH ADDED SUGAR, CORN SYRUP OR OTHER SWEETENERS. IF YOU MUST USE A SWEETENER, YOU CAN TRY STEVIA. IT IS ALSO IMPORTANT TO AVOID ARTIFICIALLY SWEETENERS AND DIET BEVERAGES. LASTLY, I SUGGEST WEARING SPF 50 SUNSCREEN ON EXPOSED PARTS AND ESPECIALLY WHEN IN THE DIRECT SUNLIGHT FOR AN EXTENDED PERIOD OF TIME.  PLEASE AVOID FAST FOOD RESTAURANTS AND INCREASE YOUR WATER INTAKE. - Insulin, random(561); Future  2.. Essential hypertension Comments: Chronic, fair control. Goal BP<130/80. She will c/w Edarbyclor 40/25 and amlodipine '5mg'$  daily. She is encouraged to follow low sodium diet. Advised to gradually increase her daily activity level. She will f/u in six months for re-evaluation.  - POCT Urinalysis Dipstick (81002) - Microalbumin /  creatinine urine ratio  3. Right ear impacted cerumen AFTER OBTAINING VERBAL CONSENT, RIGHT EAR WAS FLUSHED BY IRRIGATION.  SHE TOLERATED PROCEDURE WELL WITHOUT ANY COMPLICATIONS. NO TM ABNORMALITIES WERE NOTED. - Ear Lavage  4. Pure hypercholesterolemia Comments: LDL 110, goal <100. She is currently on rosuvastatin. She is encouraged to avoid fried foods, increase exercise and increase her fish/fiber intake.   5. Prediabetes Comments: Her a1c 6.0, drawn April 2023. She is encouraged to limit her intake of sweetened beverages, including diet drinks.  - POCT Urinalysis Dipstick (81002) - Microalbumin / creatinine urine ratio  6. Malignant neoplasm of lower-outer quadrant of right breast of female, estrogen receptor positive ([place]) Comments: She is s/p R lumpectomy. Adjuvant chemotherapy with Taxol Herceptin followed by Herceptin maintenance.  Adjuvant XRT 11/23/2020-10/17, now on tamoxifen.  7. Class 3 severe obesity due to excess calories with serious comorbidity and body mass index (BMI) of 45.0 to 49.9 in adult [facility]) Comments: We discussed the use of Wegovy for the treatment of obesity. She denies family/personal h/o thyroid cancer. She was given 0.'25mg'$  samples and advised how to administer the medication. She understands that she will not take her next dose until next week. She is reminded to stop eating when full. She will rto in 4 weeks for re-evaluation.  Possible side effects d/w patient.   Patient was given opportunity to ask questions. Patient verbalized understanding of the plan and was able to repeat key elements of the plan. All questions were answered to their satisfaction.   I, [name], MD, have reviewed all documentation for this visit. The documentation on 07/03/21 for the exam, diagnosis, procedures, and orders are all accurate and complete.   THE PATIENT IS ENCOURAGED TO PRACTICE SOCIAL DISTANCING DUE TO THE COVID-19 PANDEMIC.

## 2021-07-04 ENCOUNTER — Encounter: Payer: Self-pay | Admitting: Rehabilitation

## 2021-07-04 ENCOUNTER — Ambulatory Visit: Payer: 59 | Attending: Adult Health | Admitting: Rehabilitation

## 2021-07-04 DIAGNOSIS — M79622 Pain in left upper arm: Secondary | ICD-10-CM | POA: Diagnosis present

## 2021-07-04 DIAGNOSIS — C50511 Malignant neoplasm of lower-outer quadrant of right female breast: Secondary | ICD-10-CM

## 2021-07-04 DIAGNOSIS — I890 Lymphedema, not elsewhere classified: Secondary | ICD-10-CM

## 2021-07-04 DIAGNOSIS — M79621 Pain in right upper arm: Secondary | ICD-10-CM

## 2021-07-04 DIAGNOSIS — R600 Localized edema: Secondary | ICD-10-CM

## 2021-07-04 DIAGNOSIS — Z483 Aftercare following surgery for neoplasm: Secondary | ICD-10-CM | POA: Diagnosis present

## 2021-07-04 DIAGNOSIS — R293 Abnormal posture: Secondary | ICD-10-CM | POA: Diagnosis present

## 2021-07-04 DIAGNOSIS — Z170 Estrogen receptor positive status [ER+]: Secondary | ICD-10-CM | POA: Diagnosis present

## 2021-07-04 LAB — MICROALBUMIN / CREATININE URINE RATIO
Creatinine, Urine: 198.8 mg/dL
Microalb/Creat Ratio: 9 mg/g creat (ref 0–29)
Microalbumin, Urine: 18 ug/mL

## 2021-07-04 NOTE — Therapy (Addendum)
OUTPATIENT PHYSICAL THERAPY TREATMENT NOTE   Patient Name: Pamela [name] MRN: [id number] DOB:April 05, 1965, 56 y.o., female Today's Date: 07/04/2021  PCP: [name], MD REFERRING PROVIDER: Wilber Bihari Cornett*   PT End of Session - 07/04/21 1004     Visit Number 11    Number of Visits 13    Date for PT Re-Evaluation 07/04/21    PT Start Time 1005    PT Stop Time 1055    PT Time Calculation (min) 50 min    Activity Tolerance Patient tolerated treatment well    Behavior During Therapy [facility] for tasks assessed/performed                   Past Medical History:  Diagnosis Date   Anxiety    Asthma    Back pain    Breast cancer ([place])    Constipation    DUB (dysfunctional uterine bleeding) 2007   Edema of both lower extremities    Elevated cholesterol    Family history of breast cancer 05/12/2020   Family history of lung cancer 05/12/2020   Food allergy    H/O menorrhagia 05/01/2006   High cholesterol    History of ovarian cyst 2007   Hypertension 03/31/2005   Increased BMI 07/11/2006   Joint pain    Migraine    N&V (nausea and vomiting)    Obesity 2007   Obstructive sleep apnea syndrome, moderate 10/27/2013   no cpap   Sleep apnea    SOB (shortness of breath)    Vitamin D deficiency    Vitamin D deficiency    Weight loss 07/11/2006   Past Surgical History:  Procedure Laterality Date   BREAST LUMPECTOMY WITH RADIOACTIVE SEED AND SENTINEL LYMPH NODE BIOPSY Right 06/02/2020   Procedure: RIGHT BREAST LUMPECTOMY WITH RADIOACTIVE SEED AND RIGHT SENTINEL LYMPH NODE BIOPSY;  Surgeon: [name], MD;  Location: [place];  Service: General;  Laterality: Right;   BREAST REDUCTION SURGERY  05/1991   CESAREAN SECTION     HYSTEROSCOPY  05/01/2006   PORTACATH PLACEMENT Right 06/02/2020   Procedure: INSERTION PORT-A-CATH;  Surgeon: [name], MD;  Location: [place];  Service: General;  Laterality: Right;   Patient  Active Problem List   Diagnosis Date Noted   Prediabetes 03/13/2021   Non-intractable vomiting 07/13/2020   Port-A-Cath in place 07/08/2020   Genetic testing 05/20/2020   Family history of breast cancer 05/12/2020   Family history of lung cancer 05/12/2020   Malignant neoplasm of lower-outer quadrant of right breast of female, estrogen receptor positive ([place]) 05/03/2020   Elevated troponin level not due myocardial infarction 09/18/2019   Mixed hyperlipidemia 09/18/2019   Hypertensive emergency 09/17/2019   Primary osteoarthritis of right hip 08/06/2018   Seasonal allergies 06/12/2018   Fibrocystic disease of breast 05/27/2018   Obstructive sleep apnea syndrome, moderate 10/27/2013   Shift work sleep disorder 10/27/2013   Psychic factors associated with diseases classified elsewhere 08/07/2013   High cholesterol 07/21/2013   Essential hypertension 07/21/2013   Snoring 07/21/2013   IUD contraception-inserted 10/01/11 10/01/2011   Class 3 severe obesity due to excess calories with serious comorbidity and body mass index (BMI) of 45.0 to 49.9 in adult [facility]) 09/03/2011    REFERRING DIAG: Rt breast cancer  THERAPY DIAG:  Abnormal posture  Aftercare following surgery for neoplasm  Malignant neoplasm of lower-outer quadrant of right breast of female, estrogen receptor positive ([place])  Lymphedema, not elsewhere classified  Localized edema  Pain in left upper arm  Pain in right upper arm  PERTINENT HISTORY: PERTINENT HISTORY:  Pt had a right lumpectomy with SLNB on 06/02/2020 ith 0/1 LN. she had chemo with taxol from 5/6 - 10/21/2020, and radiation 11/23/2020-12/20/2020. Cancer was in situ and IDC Gr 3 Her 2+, ER+, PR- with ki 67 of 80%   PRECAUTIONS: PRECAUTIONS: lymphedema Rt UE and breast   WEIGHT BEARING RESTRICTIONS No  SUBJECTIVE: I had my last chemo.  My finger doesn't hurt anymore It feels better.    PAIN:  Are you having pain? No  TODAY'S TREATMENT 07/04/21 MLD with pt  permission: In Supine: Short neck, 5 diaphragmatic breaths, superficial and deep abdominals, bil axillary and Rt inguinal nodes, anterior inter-axillary and Rt axillo-inguinal nodes, chest clearance including Rt breast and then Rt UE today, then finished retracing all steps. P/ROM: In Supine to Rt shoulder into flexion, abduction, and D2 for better stretch at scar tissue in axilla;  06/27/21 MLD with pt permission: In Supine: Short neck, 5 diaphragmatic breaths, Lt axillary and Rt inguinal nodes, anterior inter-axillary and Rt axillo-inguinal nodes, then focused more on Rt UE today with Rt breast included due to more c/o hand especially and arm discomfort, then finished retracing all steps. P/ROM: In Supine to Rt shoulder into flexion, abduction, and D2 for better stretch at scar tissue in axilla; gently to Rt 3rd finger STM to Rt 3rd finger  06/20/21 Pt got her sleeve - sent to work office somehow MLD with pt permission: In Supine: Short neck, 5 diaphragmatic breaths, Lt axillary and Rt inguinal nodes, anterior inter-axillary and Rt axillo-inguinal nodes, then focused more on Rt UE today with Rt breast included due to more c/o hand and arm discomfort, then finished retracing all steps. P/ROM: In Supine to Rt shoulder into flexion, abduction, and D2 for better stretch at scar tissue in axilla  ASSESSMENT:  CLINICAL IMPRESSION: Pt is happy to have completed her last chemotherapy session.  Hoping that intermittent finger pain and swelling are related to that last infusion.  Pt continues to benefit from MLD to the arm and breast to decrease edema. Did not realize this was pt's last scheduled visit and end of POC until after pt left so we will give her a call to see if we are going to schedule more/renew vs DC   REHAB POTENTIAL: Excellent   CLINICAL DECISION MAKING: Stable/uncomplicated       GOALS: Goals reviewed with patient? Yes   SHORT TERM GOALS:   STG Name Target Date Goal status  1 Pt  will be educated on use of compression bra with foam inserts Baseline:  04/25/2021 MET                                                          LONG TERM GOALS:    LTG Name Target Date Goal status  1 Pt will be ind with self MLD for the Rt breast Baseline: 05/23/2021 MET  2 Pt will improve Rt shoulder AROM into abduction to at least 160 Baseline:135 05/23/2021 MET  3 Pt will be ind with final HEP Baseline: 05/23/2021 MET  4        5        6         7  PLAN: PT FREQUENCY: 1x/week   PT DURATION: 6 weeks   PLANNED INTERVENTIONS: Therapeutic exercises, Patient/Family education, Orthotic/Fit training, Prosthetic training, DME instructions, Manual lymph drainage, scar mobilization, and Taping   PLAN FOR NEXT SESSION: Cont Rt breast MLD having pt return demo (used handout per below),include Rt shoulder PROM and upper quadrant release to improve AROM;    Shan Levans, PT  07/04/21 11:00 AM    Manual Lymph Drainage for Right Breast. Do daily. Do slowly. Use flat hands with just enough pressure to stretch the skin. Do not slide over the skin, stretch the skin with the hand. (Stretch ? Relax ? Move) Lie down or sit comfortably (in a recliner, for example) to do this. 1. Hug yourself: cross arms and do circles at collar bones near neck 5-7 times (to "wake up" lots of lymph nodes in this area). 2. Take slow deep breaths, allowing your belly to balloon out as you breathe in, 5x (to "wake  up" abdominal lymph nodes). 3. Both armpits--stretch skin in small circles to stimulate intact lymph nodes there, 5-7x. 4. Right groin area, at panty line--stretch skin in small circles to stimulate lymph nodes 5-7x. 5. Redirect fluid from right chest toward left armpit (stretch skin starting at right chest in 3-4 spots working toward left armpit) 3-4x across the chest. 6. Redirect fluid from right armpit toward right groin (cup your hand around the curve of your rightside and do 3-4 "pumps"  from armpit to groin) 3-4x down your side. 7. Draw an imaginary diagonal line from upper outer breast through the nipple area toward lower inner breast. Direct fluid upward and inward from this line toward the pathway across your upper chest (established in #5). Do this in three rows to treat all the upper inner breast and do each row 3-4x. 8. Then direct the fluid down and out from this line toward the pathway down your side going  towards the left groin (established in #6). Do this in three rows and do each row 3-4x.  9. Then repeat your pathways (#5 and #6) 10.End with repeating #3 and #4 above. (circles in both armpits and the right groin   PHYSICAL THERAPY DISCHARGE SUMMARY  Visits from Start of Care: 11  Current functional level related to goals / functional outcomes: See above   Remaining deficits: lymphedema   Education / Equipment: Final self care  Plan: Patient agrees to discharge.  Patient is being discharged due to meeting the stated rehab goals.

## 2021-07-05 ENCOUNTER — Telehealth: Payer: Self-pay

## 2021-07-05 NOTE — Telephone Encounter (Signed)
PA for wegovy sent to plan using CMM. Waiting determination from insurance.  ?

## 2021-07-06 ENCOUNTER — Telehealth: Payer: Self-pay

## 2021-07-06 NOTE — Telephone Encounter (Signed)
I called pt to advise that her PA and appeal for wegovy have both been denied. We advised her to call her insurance company to see which weight loss med they cover and let us know. YL,RMA ?

## 2021-07-07 ENCOUNTER — Inpatient Hospital Stay: Payer: 59

## 2021-07-07 ENCOUNTER — Other Ambulatory Visit: Payer: Self-pay | Admitting: *Deleted

## 2021-07-07 ENCOUNTER — Inpatient Hospital Stay: Payer: 59 | Attending: Hematology and Oncology

## 2021-07-07 ENCOUNTER — Encounter: Payer: Self-pay | Admitting: *Deleted

## 2021-07-07 ENCOUNTER — Inpatient Hospital Stay (HOSPITAL_BASED_OUTPATIENT_CLINIC_OR_DEPARTMENT_OTHER): Payer: 59 | Admitting: Hematology and Oncology

## 2021-07-07 ENCOUNTER — Other Ambulatory Visit: Payer: Self-pay

## 2021-07-07 DIAGNOSIS — R0602 Shortness of breath: Secondary | ICD-10-CM | POA: Insufficient documentation

## 2021-07-07 DIAGNOSIS — R590 Localized enlarged lymph nodes: Secondary | ICD-10-CM | POA: Diagnosis not present

## 2021-07-07 DIAGNOSIS — R232 Flushing: Secondary | ICD-10-CM | POA: Diagnosis not present

## 2021-07-07 DIAGNOSIS — Z8249 Family history of ischemic heart disease and other diseases of the circulatory system: Secondary | ICD-10-CM | POA: Diagnosis not present

## 2021-07-07 DIAGNOSIS — C50511 Malignant neoplasm of lower-outer quadrant of right female breast: Secondary | ICD-10-CM | POA: Insufficient documentation

## 2021-07-07 DIAGNOSIS — Z793 Long term (current) use of hormonal contraceptives: Secondary | ICD-10-CM | POA: Diagnosis not present

## 2021-07-07 DIAGNOSIS — N6315 Unspecified lump in the right breast, overlapping quadrants: Secondary | ICD-10-CM | POA: Insufficient documentation

## 2021-07-07 DIAGNOSIS — Z5112 Encounter for antineoplastic immunotherapy: Secondary | ICD-10-CM | POA: Insufficient documentation

## 2021-07-07 DIAGNOSIS — T451X5A Adverse effect of antineoplastic and immunosuppressive drugs, initial encounter: Secondary | ICD-10-CM | POA: Diagnosis not present

## 2021-07-07 DIAGNOSIS — Z885 Allergy status to narcotic agent status: Secondary | ICD-10-CM | POA: Diagnosis not present

## 2021-07-07 DIAGNOSIS — Z833 Family history of diabetes mellitus: Secondary | ICD-10-CM | POA: Diagnosis not present

## 2021-07-07 DIAGNOSIS — M2560 Stiffness of unspecified joint, not elsewhere classified: Secondary | ICD-10-CM | POA: Insufficient documentation

## 2021-07-07 DIAGNOSIS — Z170 Estrogen receptor positive status [ER+]: Secondary | ICD-10-CM | POA: Insufficient documentation

## 2021-07-07 DIAGNOSIS — Z836 Family history of other diseases of the respiratory system: Secondary | ICD-10-CM | POA: Insufficient documentation

## 2021-07-07 DIAGNOSIS — Z8349 Family history of other endocrine, nutritional and metabolic diseases: Secondary | ICD-10-CM | POA: Diagnosis not present

## 2021-07-07 DIAGNOSIS — D6481 Anemia due to antineoplastic chemotherapy: Secondary | ICD-10-CM | POA: Diagnosis not present

## 2021-07-07 DIAGNOSIS — I10 Essential (primary) hypertension: Secondary | ICD-10-CM | POA: Insufficient documentation

## 2021-07-07 DIAGNOSIS — R221 Localized swelling, mass and lump, neck: Secondary | ICD-10-CM | POA: Insufficient documentation

## 2021-07-07 DIAGNOSIS — Z95828 Presence of other vascular implants and grafts: Secondary | ICD-10-CM

## 2021-07-07 DIAGNOSIS — R7303 Prediabetes: Secondary | ICD-10-CM

## 2021-07-07 DIAGNOSIS — Z803 Family history of malignant neoplasm of breast: Secondary | ICD-10-CM | POA: Insufficient documentation

## 2021-07-07 DIAGNOSIS — Z801 Family history of malignant neoplasm of trachea, bronchus and lung: Secondary | ICD-10-CM | POA: Insufficient documentation

## 2021-07-07 DIAGNOSIS — Z809 Family history of malignant neoplasm, unspecified: Secondary | ICD-10-CM | POA: Insufficient documentation

## 2021-07-07 DIAGNOSIS — Z79899 Other long term (current) drug therapy: Secondary | ICD-10-CM | POA: Insufficient documentation

## 2021-07-07 LAB — CBC WITH DIFFERENTIAL (CANCER CENTER ONLY)
Abs Immature Granulocytes: 0.04 10*3/uL (ref 0.00–0.07)
Basophils Absolute: 0 10*3/uL (ref 0.0–0.1)
Basophils Relative: 1 %
Eosinophils Absolute: 0.2 10*3/uL (ref 0.0–0.5)
Eosinophils Relative: 4 %
HCT: 32.3 % — ABNORMAL LOW (ref 36.0–46.0)
Hemoglobin: 10.5 g/dL — ABNORMAL LOW (ref 12.0–15.0)
Immature Granulocytes: 1 %
Lymphocytes Relative: 19 %
Lymphs Abs: 1.2 10*3/uL (ref 0.7–4.0)
MCH: 26.2 pg (ref 26.0–34.0)
MCHC: 32.5 g/dL (ref 30.0–36.0)
MCV: 80.5 fL (ref 80.0–100.0)
Monocytes Absolute: 0.7 10*3/uL (ref 0.1–1.0)
Monocytes Relative: 11 %
Neutro Abs: 3.9 10*3/uL (ref 1.7–7.7)
Neutrophils Relative %: 64 %
Platelet Count: 213 10*3/uL (ref 150–400)
RBC: 4.01 MIL/uL (ref 3.87–5.11)
RDW: 15.2 % (ref 11.5–15.5)
WBC Count: 6.1 10*3/uL (ref 4.0–10.5)
nRBC: 0 % (ref 0.0–0.2)

## 2021-07-07 LAB — CMP (CANCER CENTER ONLY)
ALT: 19 U/L (ref 0–44)
AST: 18 U/L (ref 15–41)
Albumin: 3.9 g/dL (ref 3.5–5.0)
Alkaline Phosphatase: 54 U/L (ref 38–126)
Anion gap: 5 (ref 5–15)
BUN: 12 mg/dL (ref 6–20)
CO2: 30 mmol/L (ref 22–32)
Calcium: 9.1 mg/dL (ref 8.9–10.3)
Chloride: 107 mmol/L (ref 98–111)
Creatinine: 0.84 mg/dL (ref 0.44–1.00)
GFR, Estimated: >60 mL/min (ref >60)
Glucose, Bld: 109 mg/dL — ABNORMAL HIGH (ref 70–99)
Potassium: 3.3 mmol/L — ABNORMAL LOW (ref 3.5–5.1)
Sodium: 142 mmol/L (ref 135–145)
Total Bilirubin: 0.6 mg/dL (ref 0.3–1.2)
Total Protein: 7.1 g/dL (ref 6.5–8.1)

## 2021-07-07 MED ORDER — CYANOCOBALAMIN 1000 MCG/ML IJ SOLN
1000.0000 ug | Freq: Once | INTRAMUSCULAR | Status: AC
  Administered 2021-07-07: 1000 ug via INTRAMUSCULAR
  Filled 2021-07-07: qty 1

## 2021-07-07 MED ORDER — SODIUM CHLORIDE 0.9 % IV SOLN: Freq: Once | INTRAVENOUS | Status: AC

## 2021-07-07 MED ORDER — SODIUM CHLORIDE 0.9% FLUSH
10.0000 mL | INTRAVENOUS | Status: DC | PRN
  Administered 2021-07-07: 10 mL

## 2021-07-07 MED ORDER — HEPARIN SOD (PORK) LOCK FLUSH 100 UNIT/ML IV SOLN
500.0000 [IU] | Freq: Once | INTRAVENOUS | Status: AC | PRN
  Administered 2021-07-07: 500 [IU]

## 2021-07-07 MED ORDER — ACETAMINOPHEN 325 MG PO TABS
650.0000 mg | ORAL_TABLET | Freq: Once | ORAL | Status: AC
  Administered 2021-07-07: 650 mg via ORAL
  Filled 2021-07-07: qty 2

## 2021-07-07 MED ORDER — SODIUM CHLORIDE 0.9% FLUSH
10.0000 mL | Freq: Once | INTRAVENOUS | Status: AC
  Administered 2021-07-07: 10 mL

## 2021-07-07 MED ORDER — DIPHENHYDRAMINE HCL 50 MG/ML IJ SOLN
50.0000 mg | Freq: Once | INTRAMUSCULAR | Status: AC
  Administered 2021-07-07: 50 mg via INTRAVENOUS
  Filled 2021-07-07: qty 1

## 2021-07-07 MED ORDER — TRASTUZUMAB-ANNS CHEMO 150 MG IV SOLR
900.0000 mg | Freq: Once | INTRAVENOUS | Status: AC
  Administered 2021-07-07: 900 mg via INTRAVENOUS
  Filled 2021-07-07: qty 42.86

## 2021-07-07 NOTE — Patient Instructions (Signed)
[facility]  Discharge Instructions: ?Thank you for choosing [facility] to provide your oncology and hematology care.  ? ?If you have a lab appointment with the [facility], please go directly to the [facility] and check in at the registration area. ?  ?Wear comfortable clothing and clothing appropriate for easy access to any Portacath or PICC line.  ? ?We strive to give you quality time with your provider. You may need to reschedule your appointment if you arrive late (15 or more minutes).  Arriving late affects you and other patients whose appointments are after yours.  Also, if you miss three or more appointments without notifying the office, you may be dismissed from the clinic at the provider?s discretion.    ?  ?For prescription refill requests, have your pharmacy contact our office and allow 72 hours for refills to be completed.   ? ?Today you received the following chemotherapy and/or immunotherapy agents: Kan[place]i  ?  ?To help prevent nausea and vomiting after your treatment, we encourage you to take your nausea medication as directed. ? ?BELOW ARE SYMPTOMS THAT SHOULD BE REPORTED IMMEDIATELY: ?*FEVER GREATER THAN 100.4 F (38 ?C) OR HIGHER ?*CHILLS OR SWEATING ?*NAUSEA AND VOMITING THAT IS NOT CONTROLLED WITH YOUR NAUSEA MEDICATION ?*UNUSUAL SHORTNESS OF BREATH ?*UNUSUAL BRUISING OR BLEEDING ?*URINARY PROBLEMS (pain or burning when urinating, or frequent urination) ?*BOWEL PROBLEMS (unusual diarrhea, constipation, pain near the anus) ?TENDERNESS IN MOUTH AND THROAT WITH OR WITHOUT PRESENCE OF ULCERS (sore throat, sores in mouth, or a toothache) ?UNUSUAL RASH, SWELLING OR PAIN  ?UNUSUAL VAGINAL DISCHARGE OR ITCHING  ? ?Items with * indicate a potential emergency and should be followed up as soon as possible or go to the Emergency Department if any problems should occur. ? ?Please show the CHEMOTHERAPY ALERT CARD or IMMUNOTHERAPY ALERT CARD at check-in to the  Emergency Department and triage nurse. ? ?Should you have questions after your visit or need to cancel or reschedule your appointment, please contact [facility]  Dept: [phone]  and follow the prompts.  Office hours are 8:00 a.m. to 4:30 p.m. Monday - Friday. Please note that voicemails left after 4:00 p.m. may not be returned until the following business day.  We are closed weekends and major holidays. You have access to a nurse at all times for urgent questions. Please call the main number to the clinic Dept: [phone] and follow the prompts. ? ? ?For any non-urgent questions, you may also contact your provider using MyChart. We now offer e-Visits for anyone 32 and older to request care online for non-urgent symptoms. For details visit [email or web address]. ?  ?Also download the MyChart app! Go to the app store, search "MyChart", open the app, select [facility], and log in with your MyChart username and password. ? ?Due to Covid, a mask is required upon entering the hospital/clinic. If you do not have a mask, one will be given to you upon arrival. For doctor visits, patients may have 1 support person aged 10 or older with them. For treatment visits, patients cannot have anyone with them due to current Covid guidelines and our immunocompromised population.  ? ?Vitamin B12 Injection ?What is this medication? ?Vitamin B12 (VAHY tuh min B12) prevents and treats low vitamin B12 levels in your body. It is used in people who do not get enough vitamin B12 from their diet or when their digestive tract does not absorb enough. Vitamin B12 plays an  important role in maintaining the health of your nervous system and red blood cells. ?This medicine may be used for other purposes; ask your health care provider or pharmacist if you have questions. ?COMMON BRAND NAME(S): B-12 Compliance Kit, B-12 Injection Kit, Cyomin, Dodex, LA-12, Nutri-Twelve, Physicians EZ Use B-12, Primabalt ?What  should I tell my care team before I take this medication? ?They need to know if you have any of these conditions: ?Kidney disease ?Leber's disease ?Megaloblastic anemia ?An unusual or allergic reaction to cyanocobalamin, cobalt, other medications, foods, dyes, or preservatives ?Pregnant or trying to get pregnant ?Breast-feeding ?How should I use this medication? ?This medication is injected into a muscle or deeply under the skin. It is usually given in a clinic or care team's office. However, your care team may teach you how to inject yourself. Follow all instructions. ?Talk to your care team about the use of this medication in children. Special care may be needed. ?Overdosage: If you think you have taken too much of this medicine contact a poison control center or emergency room at once. ?NOTE: This medicine is only for you. Do not share this medicine with others. ?What if I miss a dose? ?If you are given your dose at a clinic or care team's office, call to reschedule your appointment. If you give your own injections, and you miss a dose, take it as soon as you can. If it is almost time for your next dose, take only that dose. Do not take double or extra doses. ?What may interact with this medication? ?Colchicine ?Heavy alcohol intake ?This list may not describe all possible interactions. Give your health care provider a list of all the medicines, herbs, non-prescription drugs, or dietary supplements you use. Also tell them if you smoke, drink alcohol, or use illegal drugs. Some items may interact with your medicine. ?What should I watch for while using this medication? ?Visit your care team regularly. You may need blood work done while you are taking this medication. ?You may need to follow a special diet. Talk to your care team. Limit your alcohol intake and avoid smoking to get the best benefit. ?What side effects may I notice from receiving this medication? ?Side effects that you should report to your care team  as soon as possible: ?Allergic reactions--skin rash, itching, hives, swelling of the face, lips, tongue, or throat ?Swelling of the ankles, hands, or feet ?Trouble breathing ?Side effects that usually do not require medical attention (report to your care team if they continue or are bothersome): ?Diarrhea ?This list may not describe all possible side effects. Call your doctor for medical advice about side effects. You may report side effects to FDA at 1-800-FDA-1088. ?Where should I keep my medication? ?Keep out of the reach of children. ?Store at room temperature between 15 and 30 degrees C (59 and 85 degrees F). Protect from light. Throw away any unused medication after the expiration date. ?NOTE: This sheet is a summary. It may not cover all possible information. If you have questions about this medicine, talk to your doctor, pharmacist, or health care provider. ?? 2022 Elsevier/Gold Standard (2020-05-04 00:00:00) ? ?

## 2021-07-07 NOTE — Assessment & Plan Note (Signed)
05/03/2020:Screening mammogram showed a 1.0cm mass at the 6 o'clock position in the right breast. Diagnostic mammogram and US showed the 1.2cm mass at the 6 o'clock position in the right breast. Biopsy showed IDC, grade 3, HER-2 positive (3+), ER 15% weak, PR-, Ki67 80%. ?? ?06/02/2020:Right lumpectomy ([facility]): invasive and in situ ductal carcinoma, 1.3cm, clear margins, 1 right axillary lymph node negative for carcinoma. ER 15% weak, PR-,HER-2 positive (3+) Ki67 80%. ?? ?Treatment plan: ?1.??Adjuvant chemotherapy with Taxol Herceptin?followed by?Herceptin maintenance.??Stopped after 11 cycles of Taxol ??2.??Adjuvant radiation therapy?11/23/2020-12/19/2020 ?3.??Adjuvant antiestrogen therapy ?----------------------------------------------------------------------------------------------------- ?Current?Treatment?Herceptin?maintenance (2 days a last treatment) ?ECHO?08/30/20: EF 60-65% ?Herceptin toxicities: None ?Chemotherapy-induced anemia: Monitoring hemoglobin today is 10.7. ?? ?01/13/2021: FSH: 28, Estradiol 14.4: not in menopause ?? ?Tamoxifen toxicities: Started 04/14/2021 ?1.  Mood swings and irritability ?2. hot flashes ?? ?She joined a weight loss program called?[facility]. ?She lost about 10 to 12 pounds. ?She is on phentermine.  She is hoping to discuss with her primary care physician about going on weight loss injections ? ?We will request for port removal ?Return to clinic in 6 months for follow-up after that we can see her once a year ?

## 2021-07-08 LAB — INSULIN, RANDOM: Insulin: 27.4 u[IU]/mL — ABNORMAL HIGH (ref 2.6–24.9)

## 2021-07-08 LAB — FOLLICLE STIMULATING HORMONE: FSH: 21.8 m[IU]/mL

## 2021-07-10 ENCOUNTER — Other Ambulatory Visit: Payer: Self-pay | Admitting: Hematology and Oncology

## 2021-07-10 ENCOUNTER — Telehealth: Payer: Self-pay | Admitting: Hematology and Oncology

## 2021-07-10 DIAGNOSIS — D649 Anemia, unspecified: Secondary | ICD-10-CM

## 2021-07-10 NOTE — Telephone Encounter (Signed)
Per 5/5 los called and spoke to pt about lab appointment pt confirmed appointment  ?

## 2021-07-12 ENCOUNTER — Inpatient Hospital Stay: Payer: 59

## 2021-07-12 ENCOUNTER — Telehealth: Payer: Self-pay | Admitting: Hematology and Oncology

## 2021-07-12 ENCOUNTER — Other Ambulatory Visit: Payer: Self-pay

## 2021-07-12 ENCOUNTER — Inpatient Hospital Stay (HOSPITAL_BASED_OUTPATIENT_CLINIC_OR_DEPARTMENT_OTHER): Payer: 59 | Admitting: Physician Assistant

## 2021-07-12 VITALS — BP 142/75 | HR 69 | Temp 98.3°F | Resp 16 | Wt 310.0 lb

## 2021-07-12 DIAGNOSIS — R221 Localized swelling, mass and lump, neck: Secondary | ICD-10-CM

## 2021-07-12 DIAGNOSIS — Z5112 Encounter for antineoplastic immunotherapy: Secondary | ICD-10-CM | POA: Diagnosis not present

## 2021-07-12 DIAGNOSIS — R0602 Shortness of breath: Secondary | ICD-10-CM

## 2021-07-12 DIAGNOSIS — Z0000 Encounter for general adult medical examination without abnormal findings: Secondary | ICD-10-CM

## 2021-07-12 DIAGNOSIS — Z170 Estrogen receptor positive status [ER+]: Secondary | ICD-10-CM

## 2021-07-12 DIAGNOSIS — Z95828 Presence of other vascular implants and grafts: Secondary | ICD-10-CM

## 2021-07-12 DIAGNOSIS — C50511 Malignant neoplasm of lower-outer quadrant of right female breast: Secondary | ICD-10-CM | POA: Diagnosis not present

## 2021-07-12 DIAGNOSIS — D649 Anemia, unspecified: Secondary | ICD-10-CM

## 2021-07-12 LAB — RETIC PANEL
Immature Retic Fract: 26.7 % — ABNORMAL HIGH (ref 2.3–15.9)
RBC.: 4.2 MIL/uL (ref 3.87–5.11)
Retic Count, Absolute: 110 10*3/uL (ref 19.0–186.0)
Retic Ct Pct: 2.6 % (ref 0.4–3.1)
Reticulocyte Hemoglobin: 29 pg (ref >27.9)

## 2021-07-12 LAB — CBC WITH DIFFERENTIAL (CANCER CENTER ONLY)
Abs Immature Granulocytes: 0.05 10*3/uL (ref 0.00–0.07)
Basophils Absolute: 0 10*3/uL (ref 0.0–0.1)
Basophils Relative: 1 %
Eosinophils Absolute: 0.2 10*3/uL (ref 0.0–0.5)
Eosinophils Relative: 3 %
HCT: 33.5 % — ABNORMAL LOW (ref 36.0–46.0)
Hemoglobin: 10.9 g/dL — ABNORMAL LOW (ref 12.0–15.0)
Immature Granulocytes: 1 %
Lymphocytes Relative: 22 %
Lymphs Abs: 1.3 10*3/uL (ref 0.7–4.0)
MCH: 26.3 pg (ref 26.0–34.0)
MCHC: 32.5 g/dL (ref 30.0–36.0)
MCV: 80.9 fL (ref 80.0–100.0)
Monocytes Absolute: 0.6 10*3/uL (ref 0.1–1.0)
Monocytes Relative: 9 %
Neutro Abs: 3.9 10*3/uL (ref 1.7–7.7)
Neutrophils Relative %: 64 %
Platelet Count: 218 10*3/uL (ref 150–400)
RBC: 4.14 MIL/uL (ref 3.87–5.11)
RDW: 15.2 % (ref 11.5–15.5)
WBC Count: 6 10*3/uL (ref 4.0–10.5)
nRBC: 0 % (ref 0.0–0.2)

## 2021-07-12 LAB — CMP (CANCER CENTER ONLY)
ALT: 21 U/L (ref 0–44)
AST: 26 U/L (ref 15–41)
Albumin: 3.7 g/dL (ref 3.5–5.0)
Alkaline Phosphatase: 52 U/L (ref 38–126)
Anion gap: 7 (ref 5–15)
BUN: 14 mg/dL (ref 6–20)
CO2: 30 mmol/L (ref 22–32)
Calcium: 9.3 mg/dL (ref 8.9–10.3)
Chloride: 103 mmol/L (ref 98–111)
Creatinine: 0.81 mg/dL (ref 0.44–1.00)
GFR, Estimated: >60 mL/min (ref >60)
Glucose, Bld: 101 mg/dL — ABNORMAL HIGH (ref 70–99)
Potassium: 3.3 mmol/L — ABNORMAL LOW (ref 3.5–5.1)
Sodium: 140 mmol/L (ref 135–145)
Total Bilirubin: 0.8 mg/dL (ref 0.3–1.2)
Total Protein: 7.2 g/dL (ref 6.5–8.1)

## 2021-07-12 LAB — IRON AND IRON BINDING CAPACITY (CC-WL,HP ONLY)
Iron: 56 ug/dL (ref 28–170)
Saturation Ratios: 15 % (ref 10.4–31.8)
TIBC: 370 ug/dL (ref 250–450)
UIBC: 314 ug/dL

## 2021-07-12 LAB — LACTATE DEHYDROGENASE: LDH: 203 U/L — ABNORMAL HIGH (ref 98–192)

## 2021-07-12 LAB — FOLATE: Folate: 11.1 ng/mL (ref >5.9)

## 2021-07-12 LAB — FERRITIN: Ferritin: 20 ng/mL (ref 11–307)

## 2021-07-12 MED ORDER — SODIUM CHLORIDE 0.9% FLUSH
10.0000 mL | Freq: Once | INTRAVENOUS | Status: AC
  Administered 2021-07-12: 10 mL

## 2021-07-12 MED ORDER — HEPARIN SOD (PORK) LOCK FLUSH 100 UNIT/ML IV SOLN
500.0000 [IU] | Freq: Once | INTRAVENOUS | Status: AC
  Administered 2021-07-12: 500 [IU]

## 2021-07-12 NOTE — Telephone Encounter (Signed)
.  Called patient to schedule appointment per 5/8 inbasket, patient is aware of date and time.   ?

## 2021-07-12 NOTE — Progress Notes (Signed)
? ? ? ?Symptom Management Consult note ?[facility]   ? ?Patient Care Team: ?[name], MD as PCP - General (Internal Medicine) ?Mcarthur Rossetti, MD as Consulting Physician (Orthopedic Surgery) ?Mauro Kaufmann, RN as Oncology Nurse Navigator ?Rockwell Germany, RN as Oncology Nurse Navigator ?[name], MD as Consulting Physician (General Surgery) ?Nicholas Lose, MD as Consulting Physician (Hematology and Oncology) ?Eppie Gibson, MD as Attending Physician (Radiation Oncology)  ? ? ?Name of the patient: Pamela [name]  [id number]  Mar 29, 1965  ? ?Date of visit: 07/12/2021  ? ? ?Chief complaint/ Reason for visit- neck swelling and shortness of breath ? ?Oncology History  ?Malignant neoplasm of lower-outer quadrant of right breast of female, estrogen receptor positive ([place])  ?05/03/2020 Initial Diagnosis  ? Screening mammogram showed a 1.0cm mass at the 6 o'clock position in the right breast. Diagnostic mammogram and US showed the 1.2cm mass at the 6 o'clock position in the right breast. Biopsy showed IDC, grade 3, HER-2 positive (3+), ER 15% weak, PR-, Ki67 80%. ?  ?05/11/2020 Cancer Staging  ? Staging form: Breast, AJCC 8th Edition ?- Clinical stage from 05/11/2020: Stage IA (cT1b, cN0, cM0, G3, ER+, PR-, HER2+) - Signed by Nicholas Lose, MD on 05/11/2020 ?Stage prefix: Initial diagnosis ? ?  ?05/19/2020 Genetic Testing  ? Negative hereditary cancer genetic testing: no pathogenic variants detected in [facility] Breast STAT Panel and Common Hereditary Cancers Panel.  The report dates are May 19, 2020 (STAT) and May 23, 2020 (Common Hereditary).  ? ?The Common Hereditary Cancers Panel offered by [facility] includes sequencing and/or deletion duplication testing of the following 47 genes: APC, ATM, AXIN2, BARD1, BMPR1A, BRCA1, BRCA2, BRIP1, CDH1, CDK4, CDKN2A (p14ARF), CDKN2A (p16INK4a), CHEK2, CTNNA1, DICER1, EPCAM (Deletion/duplication testing only), GREM1 (promoter region deletion/duplication  testing only), GREM1, HOXB13, KIT, MEN1, MLH1, MSH2, MSH3, MSH6, MUTYH, NBN, NF1, NHTL1, PALB2, PDGFRA, PMS2, POLD1, POLE, PTEN, RAD50, RAD51C, RAD51D, SDHA, SDHB, SDHC, SDHD, SMAD4, SMARCA4. STK11, TP53, TSC1, TSC2, and VHL.  The following genes were evaluated for sequence changes only: SDHA and HOXB13 c.251G>A variant only.es only: SDHA and HOXB13 c.251G>A variant only. ?  ?06/02/2020 Surgery  ? Right lumpectomy ([facility]): invasive and in situ ductal carcinoma, 1.3cm, clear margins, 1 right axillary lymph node negative for carcinoma. ?  ?06/02/2020 Cancer Staging  ? Staging form: Breast, AJCC 8th Edition ?- Pathologic stage from 06/02/2020: Stage IA (pT1c, pN0, cM0, G3, ER+, PR-, HER2+) - Signed by Gardenia Phlegm, NP on 06/15/2020 ?Stage prefix: Initial diagnosis ?Histologic grading system: 3 grade system ? ?  ?07/08/2020 - 10/21/2020 Chemotherapy  ? Taxol Herceptin followed by Herceptin maintenance ?  ?11/11/2020 -  Chemotherapy  ? Herceptin maintenance ? ?  ? ?  ?04/14/2021 -  Anti-estrogen oral therapy  ? Tamoxifen 10 mg ?  ? ? ?Current Therapy: trastzumab-anns day 1 cycle 14 on 07/07/21, currently on tamoxifen ? ?Interval history- Pamela [name] is a 56 yo female with oncologic history as above presenting to [facility] today with chief complaint of shortness of breath and right sided neck swelling. Patient noticed the swelling x 1 week ago. She states yesterday she was riding with her coworker to [place] and developed a crick in the left side of her neck that she described it as aching. She rated pain 7/10 in severity. The pain was worse with movement. She took tylenol last night and applied heating pad. When she woke up this morning pain had resolved. She has been going to physical therapy  for lymphedema and has massages on her neck and arm with the last massage being x 2 days ago. She also reports shortness of breath that has been going on x 2 months. She first noticed it after starting tamoxifen. She has  history of chemotherapy induced anemia, has never required blood transfusion. She tells me today that over the last 2 weeks the shortness of breath has worsened with activity and she has to stop to catch her breath. She denies any fever, chills, difficulty swallowing, sensation of throat swelling, difficulty tolerating PO intake, cough, chest pain, hemoptysis, abdominal pain, nausea, vomiting, rash, lower extremity edema. Denies history of blood clots. No recent travel or immobilization. ? ? ?ROS  ?All other systems are reviewed and are negative for acute change except as noted in the HPI. ? ? ? ?Allergies  ?Allergen Reactions  ? Pollen Extract Shortness Of Breath  ? Latex Rash  ? Codeine Hives  ? Shellfish Allergy Hives  ? Betadine [Povidone Iodine] Rash  ? ? ? ?Past Medical History:  ?Diagnosis Date  ? Anxiety   ? Asthma   ? Back pain   ? Breast cancer ([place])   ? Constipation   ? DUB (dysfunctional uterine bleeding) 2007  ? Edema of both lower extremities   ? Elevated cholesterol   ? Family history of breast cancer 05/12/2020  ? Family history of lung cancer 05/12/2020  ? Food allergy   ? H/O menorrhagia 05/01/2006  ? High cholesterol   ? History of ovarian cyst 2007  ? Hypertension 03/31/2005  ? Increased BMI 07/11/2006  ? Joint pain   ? Migraine   ? N&V (nausea and vomiting)   ? Obesity 2007  ? Obstructive sleep apnea syndrome, moderate 10/27/2013  ? no cpap  ? Sleep apnea   ? SOB (shortness of breath)   ? Vitamin D deficiency   ? Vitamin D deficiency   ? Weight loss 07/11/2006  ? ? ? ?Past Surgical History:  ?Procedure Laterality Date  ? BREAST LUMPECTOMY WITH RADIOACTIVE SEED AND SENTINEL LYMPH NODE BIOPSY Right 06/02/2020  ? Procedure: RIGHT BREAST LUMPECTOMY WITH RADIOACTIVE SEED AND RIGHT SENTINEL LYMPH NODE BIOPSY;  Surgeon: [name], MD;  Location: [facility];  Service: General;  Laterality: Right;  ? BREAST REDUCTION SURGERY  05/1991  ? CESAREAN SECTION    ? HYSTEROSCOPY  05/01/2006  ?  PORTACATH PLACEMENT Right 06/02/2020  ? Procedure: INSERTION PORT-A-CATH;  Surgeon: [name], MD;  Location: [facility];  Service: General;  Laterality: Right;  ? ? ?Social History  ? ?Socioeconomic History  ? Marital status: Single  ?  Spouse name: Not on file  ? Number of children: Not on file  ? Years of education: Not on file  ? Highest education level: Not on file  ?Occupational History  ? Occupation: Education officer, museum  ?Tobacco Use  ? Smoking status: Never  ? Smokeless tobacco: Never  ?Vaping Use  ? Vaping Use: Never used  ?Substance and Sexual Activity  ? Alcohol use: No  ? Drug use: Not Currently  ?  Frequency: 2.0 times per week  ? Sexual activity: Not Currently  ?  Birth control/protection: I.U.D.  ?  Comment: Mirena  ?Other Topics Concern  ? Not on file  ?Social History Narrative  ? Not on file  ? ?Social Determinants of Health  ? ?Financial Resource Strain: Not on file  ?Food Insecurity: Not on file  ?Transportation Needs: Not on file  ?Physical Activity:  Not on file  ?Stress: Not on file  ?Social Connections: Not on file  ?Intimate Partner Violence: Not on file  ? ? ?Family History  ?Problem Relation Age of Onset  ? Hypertension Mother   ? High Cholesterol Mother   ? Thyroid disease Mother   ? Cancer Mother   ? Sleep apnea Mother   ? Hypertension Father   ? Heart attack Father   ? Sudden death Father   ? Diabetes Maternal Grandmother   ? Bone cancer Maternal Grandfather   ?     dx after 50  ? Breast cancer Maternal Aunt   ?     dx 68s  ? Lung cancer Maternal Aunt   ?     dx after 50  ? ? ? ?Current Outpatient Medications:  ?  amLODipine (NORVASC) 10 MG tablet, Take 1 tablet (10 mg total) by mouth daily., Disp: 90 tablet, Rfl: 2 ?  aspirin EC 81 MG tablet, Take 1 tablet (81 mg total) by mouth daily. Swallow whole., Disp: 30 tablet, Rfl: 11 ?  Azilsartan-Chlorthalidone (EDARBYCLOR) 40-25 MG TABS, Take 1 tablet by mouth daily., Disp: 90 tablet, Rfl: 2 ?  hydrOXYzine (ATARAX) 25 MG tablet,  Take 1 tablet by mouth every 12 (twelve) hours as needed., Disp: , Rfl:  ?  levocetirizine (XYZAL) 5 MG tablet, Take 1 tablet (5 mg total) by mouth every evening., Disp: 90 tablet, Rfl: 0 ?  levonorgestrel

## 2021-07-13 ENCOUNTER — Ambulatory Visit (HOSPITAL_COMMUNITY)
Admission: RE | Admit: 2021-07-13 | Discharge: 2021-07-13 | Disposition: A | Discharge status: Home / Self Care | Payer: 59 | Source: Ambulatory Visit | Attending: Physician Assistant | Admitting: Physician Assistant

## 2021-07-13 ENCOUNTER — Inpatient Hospital Stay (HOSPITAL_BASED_OUTPATIENT_CLINIC_OR_DEPARTMENT_OTHER): Payer: 59 | Admitting: Hematology and Oncology

## 2021-07-13 VITALS — BP 156/78 | HR 85 | Temp 98.1°F | Resp 18 | Ht 69.0 in | Wt 305.5 lb

## 2021-07-13 DIAGNOSIS — R0602 Shortness of breath: Secondary | ICD-10-CM | POA: Insufficient documentation

## 2021-07-13 DIAGNOSIS — Z170 Estrogen receptor positive status [ER+]: Secondary | ICD-10-CM | POA: Diagnosis not present

## 2021-07-13 DIAGNOSIS — C50511 Malignant neoplasm of lower-outer quadrant of right female breast: Secondary | ICD-10-CM | POA: Diagnosis not present

## 2021-07-13 DIAGNOSIS — R221 Localized swelling, mass and lump, neck: Secondary | ICD-10-CM | POA: Insufficient documentation

## 2021-07-13 DIAGNOSIS — Z5112 Encounter for antineoplastic immunotherapy: Secondary | ICD-10-CM | POA: Diagnosis not present

## 2021-07-13 LAB — ESTRADIOL, ULTRA SENS: Estradiol, Sensitive: 12.9 pg/mL

## 2021-07-13 MED ORDER — HEPARIN SOD (PORK) LOCK FLUSH 100 UNIT/ML IV SOLN
500.0000 [IU] | Freq: Once | INTRAVENOUS | Status: AC
  Administered 2021-07-13: 500 [IU] via INTRAVENOUS

## 2021-07-13 MED ORDER — SODIUM CHLORIDE (PF) 0.9 % IJ SOLN
INTRAMUSCULAR | Status: AC
  Filled 2021-07-13: qty 50

## 2021-07-13 MED ORDER — IOHEXOL 350 MG/ML SOLN
100.0000 mL | Freq: Once | INTRAVENOUS | Status: AC | PRN
  Administered 2021-07-13: 62 mL via INTRAVENOUS

## 2021-07-13 MED ORDER — HEPARIN SOD (PORK) LOCK FLUSH 100 UNIT/ML IV SOLN
INTRAVENOUS | Status: AC
  Filled 2021-07-13: qty 5

## 2021-07-13 NOTE — Assessment & Plan Note (Signed)
05/03/2020:Screening mammogram showed a 1.0cm mass at the 6 o'clock position in the right breast. Diagnostic mammogram and US showed the 1.2cm mass at the 6 o'clock position in the right breast. Biopsy showed IDC, grade 3, HER-2 positive (3+), ER 15% weak, PR-, Ki67 80%. ?? ?06/02/2020:Right lumpectomy ([facility]): invasive and in situ ductal carcinoma, 1.3cm, clear margins, 1 right axillary lymph node negative for carcinoma. ER 15% weak, PR-,HER-2 positive (3+) Ki67 80%. ?? ?Treatment plan: ?1.??Adjuvant chemotherapy with Taxol Herceptin?followed by?Herceptin maintenance.??Stopped after 11 cycles of Taxol Herceptin maintenance completed 07/07/2021 ??2.??Adjuvant radiation therapy?11/23/2020-12/19/2020 ?3.??Adjuvant antiestrogen therapy with tamoxifen started October 2022 ?----------------------------------------------------------------------------------------------------- ?Right supraclavicular lymphadenopathy: I will request interventional radiology to do ultrasound-guided biopsy of the lymph node.  Differential diagnosis is malignancy versus infection versus inflammation. ? ?Return to clinic after the biopsy to discuss the pathology report and come up with the treatment plan. ?

## 2021-07-13 NOTE — Progress Notes (Signed)
? ?Patient Care Team: ?[name], MD as PCP - General (Internal Medicine) ?Mcarthur Rossetti, MD as Consulting Physician (Orthopedic Surgery) ?Mauro Kaufmann, RN as Oncology Nurse Navigator ?Rockwell Germany, RN as Oncology Nurse Navigator ?[name], MD as Consulting Physician (General Surgery) ?Nicholas Lose, MD as Consulting Physician (Hematology and Oncology) ?Eppie Gibson, MD as Attending Physician (Radiation Oncology) ? ?DIAGNOSIS:  ?Encounter Diagnoses  ?Name Primary?  ? Neck swelling Yes  ? Malignant neoplasm of lower-outer quadrant of right breast of female, estrogen receptor positive ([place])   ? ? ?SUMMARY OF ONCOLOGIC HISTORY: ?Oncology History  ?Malignant neoplasm of lower-outer quadrant of right breast of female, estrogen receptor positive ([place])  ?05/03/2020 Initial Diagnosis  ? Screening mammogram showed a 1.0cm mass at the 6 o'clock position in the right breast. Diagnostic mammogram and US showed the 1.2cm mass at the 6 o'clock position in the right breast. Biopsy showed IDC, grade 3, HER-2 positive (3+), ER 15% weak, PR-, Ki67 80%. ?  ?05/11/2020 Cancer Staging  ? Staging form: Breast, AJCC 8th Edition ?- Clinical stage from 05/11/2020: Stage IA (cT1b, cN0, cM0, G3, ER+, PR-, HER2+) - Signed by Nicholas Lose, MD on 05/11/2020 ?Stage prefix: Initial diagnosis ? ?  ?05/19/2020 Genetic Testing  ? Negative hereditary cancer genetic testing: no pathogenic variants detected in [facility] Breast STAT Panel and Common Hereditary Cancers Panel.  The report dates are May 19, 2020 (STAT) and May 23, 2020 (Common Hereditary).  ? ?The Common Hereditary Cancers Panel offered by [facility] includes sequencing and/or deletion duplication testing of the following 47 genes: APC, ATM, AXIN2, BARD1, BMPR1A, BRCA1, BRCA2, BRIP1, CDH1, CDK4, CDKN2A (p14ARF), CDKN2A (p16INK4a), CHEK2, CTNNA1, DICER1, EPCAM (Deletion/duplication testing only), GREM1 (promoter region deletion/duplication testing only), GREM1, HOXB13,  KIT, MEN1, MLH1, MSH2, MSH3, MSH6, MUTYH, NBN, NF1, NHTL1, PALB2, PDGFRA, PMS2, POLD1, POLE, PTEN, RAD50, RAD51C, RAD51D, SDHA, SDHB, SDHC, SDHD, SMAD4, SMARCA4. STK11, TP53, TSC1, TSC2, and VHL.  The following genes were evaluated for sequence changes only: SDHA and HOXB13 c.251G>A variant only.es only: SDHA and HOXB13 c.251G>A variant only. ?  ?06/02/2020 Surgery  ? Right lumpectomy ([facility]): invasive and in situ ductal carcinoma, 1.3cm, clear margins, 1 right axillary lymph node negative for carcinoma. ?  ?06/02/2020 Cancer Staging  ? Staging form: Breast, AJCC 8th Edition ?- Pathologic stage from 06/02/2020: Stage IA (pT1c, pN0, cM0, G3, ER+, PR-, HER2+) - Signed by Gardenia Phlegm, NP on 06/15/2020 ?Stage prefix: Initial diagnosis ?Histologic grading system: 3 grade system ? ?  ?07/08/2020 - 10/21/2020 Chemotherapy  ? Taxol Herceptin followed by Herceptin maintenance ?  ?11/11/2020 -  Chemotherapy  ? Herceptin maintenance ? ?  ? ?  ?04/14/2021 -  Anti-estrogen oral therapy  ? Tamoxifen 10 mg ?  ? ? ?CHIEF COMPLIANT:  Follow-up after scans.  ? ?INTERVAL HISTORY: Pamela [name] is a 56 y.o. with above-mentioned history of breast cancer treated with lumpectomy, and is currently on  Herceptin. maintenance. She reports to the clinic today for follow-up. Complain of shortness of breath which got better. Complains of swelling on [place] bone. This has been going on for 2 weeks.  This is not associated with any pain or discomfort.  She underwent a CT of the chest today which revealed very large right supraclavicular lymph node measuring 5 cm along with that there was an internal mammary lymph node.  She came in to discuss the results of the scan.  She does not have any fevers or chills.  She had a  slight upper respiratory symptoms but it was felt to be allergy related and she did not require antibiotics. ? ? ?ALLERGIES:  is allergic to pollen extract, latex, codeine, shellfish allergy, and betadine [povidone  iodine]. ? ?MEDICATIONS:  ?Current Outpatient Medications  ?Medication Sig Dispense Refill  ? amLODipine (NORVASC) 10 MG tablet Take 1 tablet (10 mg total) by mouth daily. 90 tablet 2  ? aspirin EC 81 MG tablet Take 1 tablet (81 mg total) by mouth daily. Swallow whole. 30 tablet 11  ? Azilsartan-Chlorthalidone (EDARBYCLOR) 40-25 MG TABS Take 1 tablet by mouth daily. 90 tablet 2  ? hydrOXYzine (ATARAX) 25 MG tablet Take 1 tablet by mouth every 12 (twelve) hours as needed.    ? levocetirizine (XYZAL) 5 MG tablet Take 1 tablet (5 mg total) by mouth every evening. 90 tablet 0  ? levonorgestrel (MIRENA, 52 MG,) 20 MCG/DAY IUD 1 each by Intrauterine route once.    ? lidocaine-prilocaine (EMLA) cream Apply 1 application topically daily as needed. 60 g 0  ? montelukast (SINGULAIR) 10 MG tablet Take 1 tablet (10 mg total) by mouth daily. 90 tablet 0  ? OVER THE COUNTER MEDICATION Take 1 capsule by mouth daily. Medi weight loss enteric coated omega 3    ? OVER THE COUNTER MEDICATION Take 1 capsule by mouth daily. Medi weight loss Calcium four blend    ? OVER THE COUNTER MEDICATION Take 1 capsule by mouth daily. Medi weight loss fat burner    ? OVER THE COUNTER MEDICATION Take 1 capsule by mouth daily. Medi weight loss vita super    ? rosuvastatin (CRESTOR) 40 MG tablet Take 1 tablet (40 mg total) by mouth daily. 90 tablet 3  ? Semaglutide-Weight Management (WEGOVY) 0.5 MG/0.5ML SOAJ Inject 0.5 mg into the skin once a week. 2 mL 0  ? tamoxifen (NOLVADEX) 20 MG tablet Take 10 mg by mouth daily.    ? VITAMIN D, CHOLECALCIFEROL, PO Take 1 tablet by mouth daily.    ? ?Current Facility-Administered Medications  ?Medication Dose Route Frequency Provider Last Rate Last Admin  ? 0.9 %  sodium chloride infusion   Intravenous PRN Bary Castilla, NP      ? ?Facility-Administered Medications Ordered in Other Visits  ?Medication Dose Route Frequency Provider Last Rate Last Admin  ? heparin lock flush 100 UNIT/ML injection           ?  heparin lock flush 100 unit/mL  500 Units Intracatheter Once Nicholas Lose, MD      ? sodium chloride (PF) 0.9 % injection           ? sodium chloride flush (NS) 0.9 % injection 10 mL  10 mL Intracatheter Once Nicholas Lose, MD      ? ? ?PHYSICAL EXAMINATION: ?ECOG PERFORMANCE STATUS: 1 - Symptomatic but completely ambulatory ? ?Vitals:  ? 07/13/21 1333  ?BP: (!) 156/78  ?Pulse: 85  ?Resp: 18  ?Temp: 98.1 ?F (36.7 ?C)  ?SpO2: 100%  ? ?Filed Weights  ? 07/13/21 1333  ?Weight: (!) 305 lb 8 oz (138.6 kg)  ? ?Prominent right supraclavicular lymph node ? ?LABORATORY DATA:  ?I have reviewed the data as listed ? ?  Latest Ref Rng & Units 07/12/2021  ?  1:02 PM 07/07/2021  ?  8:34 AM 06/16/2021  ?  8:51 AM  ?CMP  ?Glucose 70 - 99 mg/dL 101   109   104    ?BUN 6 - 20 mg/dL _0 ?  Creatinine 0.44 - 1.00 mg/dL 0.81   0.84   0.90    ?Sodium 135 - 145 mmol/L 140   142   140    ?Potassium 3.5 - 5.1 mmol/L 3.3   3.3   3.5    ?Chloride 98 - 111 mmol/L 103   107   103    ?CO2 22 - 32 mmol/L 30   30   32    ?Calcium 8.9 - 10.3 mg/dL 9.3   9.1   9.2    ?Total Protein 6.5 - 8.1 g/dL 7.2   7.1   7.0    ?Total Bilirubin 0.3 - 1.2 mg/dL 0.8   0.6   0.6    ?Alkaline Phos 38 - 126 U/L 52   54   55    ?AST 15 - 41 U/L _0 ?ALT 0 - 44 U/L _1 ? ? ?Lab Results  ?Component Value Date  ? WBC 6.0 07/12/2021  ? HGB 10.9 (L) 07/12/2021  ? HCT 33.5 (L) 07/12/2021  ? MCV 80.9 07/12/2021  ? PLT 218 07/12/2021  ? NEUTROABS 3.9 07/12/2021  ? ? ?ASSESSMENT & PLAN:  ?Malignant neoplasm of lower-outer quadrant of right breast of female, estrogen receptor positive ([place]) ?05/03/2020:Screening mammogram showed a 1.0cm mass at the 6 o'clock position in the right breast. Diagnostic mammogram and US showed the 1.2cm mass at the 6 o'clock position in the right breast. Biopsy showed IDC, grade 3, HER-2 positive (3+), ER 15% weak, PR-, Ki67 80%. ?  ?06/02/2020:Right lumpectomy ([facility]): invasive and in situ ductal carcinoma, 1.3cm, clear  margins, 1 right axillary lymph node negative for carcinoma. ER 15% weak, PR-,HER-2 positive (3+) Ki67 80%. ?  ?Treatment plan: ?1.  Adjuvant chemotherapy with Taxol Herceptin followed by Herceptin Belva Bertin

## 2021-07-13 NOTE — Progress Notes (Unsigned)
Criselda Peaches, MD  [facility] Procedure Requests ?Approved for [place] core biopsy of right supraclavicular mass.  Hx breast cancer, watch out for the adjacent port.   ?

## 2021-07-13 NOTE — Progress Notes (Signed)
Right upper extremity venous duplex has been completed. ?Preliminary results can be found in CV Proc through chart review.  ?Results were faxed to [facility]. ? ?07/13/21 10:45 AM ?Carlos Levering RVT   ?

## 2021-07-13 NOTE — Progress Notes (Incomplete)
HEMATOLOGY-ONCOLOGY TELEPHONE VISIT PROGRESS NOTE ? ?I connected with @PTNAME @ on 07/13/21 at 11:45 AM EDT by telephone and verified that I am speaking with the correct person using two identifiers.  ?I discussed the limitations, risks, security and privacy concerns of performing an evaluation and management service by telephone and the availability of in person appointments.  ?I also discussed with the patient that there may be a patient responsible charge related to this service. The patient expressed understanding and agreed to proceed.  ? ?History of Present Illness:  Pamela [name] is a 56 y.o. with  history of breast cancer treated with lumpectomy, and is currently on  Herceptin. maintenance. She reports to the clinic today via telephone follow-up. ? ?Oncology History  ?Malignant neoplasm of lower-outer quadrant of right breast of female, estrogen receptor positive ([place])  ?05/03/2020 Initial Diagnosis  ? Screening mammogram showed a 1.0cm mass at the 6 o'clock position in the right breast. Diagnostic mammogram and US showed the 1.2cm mass at the 6 o'clock position in the right breast. Biopsy showed IDC, grade 3, HER-2 positive (3+), ER 15% weak, PR-, Ki67 80%. ?  ?05/11/2020 Cancer Staging  ? Staging form: Breast, AJCC 8th Edition ?- Clinical stage from 05/11/2020: Stage IA (cT1b, cN0, cM0, G3, ER+, PR-, HER2+) - Signed by Nicholas Lose, MD on 05/11/2020 ?Stage prefix: Initial diagnosis ? ?  ?05/19/2020 Genetic Testing  ? Negative hereditary cancer genetic testing: no pathogenic variants detected in [facility] Breast STAT Panel and Common Hereditary Cancers Panel.  The report dates are May 19, 2020 (STAT) and May 23, 2020 (Common Hereditary).  ? ?The Common Hereditary Cancers Panel offered by [facility] includes sequencing and/or deletion duplication testing of the following 47 genes: APC, ATM, AXIN2, BARD1, BMPR1A, BRCA1, BRCA2, BRIP1, CDH1, CDK4, CDKN2A (p14ARF), CDKN2A (p16INK4a), CHEK2, CTNNA1, DICER1, EPCAM  (Deletion/duplication testing only), GREM1 (promoter region deletion/duplication testing only), GREM1, HOXB13, KIT, MEN1, MLH1, MSH2, MSH3, MSH6, MUTYH, NBN, NF1, NHTL1, PALB2, PDGFRA, PMS2, POLD1, POLE, PTEN, RAD50, RAD51C, RAD51D, SDHA, SDHB, SDHC, SDHD, SMAD4, SMARCA4. STK11, TP53, TSC1, TSC2, and VHL.  The following genes were evaluated for sequence changes only: SDHA and HOXB13 c.251G>A variant only.es only: SDHA and HOXB13 c.251G>A variant only. ?  ?06/02/2020 Surgery  ? Right lumpectomy ([facility]): invasive and in situ ductal carcinoma, 1.3cm, clear margins, 1 right axillary lymph node negative for carcinoma. ?  ?06/02/2020 Cancer Staging  ? Staging form: Breast, AJCC 8th Edition ?- Pathologic stage from 06/02/2020: Stage IA (pT1c, pN0, cM0, G3, ER+, PR-, HER2+) - Signed by Gardenia Phlegm, NP on 06/15/2020 ?Stage prefix: Initial diagnosis ?Histologic grading system: 3 grade system ? ?  ?07/08/2020 - 10/21/2020 Chemotherapy  ? Taxol Herceptin followed by Herceptin maintenance ?  ?11/11/2020 -  Chemotherapy  ? Herceptin maintenance ? ?  ? ?  ?04/14/2021 -  Anti-estrogen oral therapy  ? Tamoxifen 10 mg ?  ? ? ?REVIEW OF SYSTEMS:   ?Constitutional: Denies fevers, chills or abnormal weight loss ?Eyes: Denies blurriness of vision ?Ears, nose, mouth, throat, and face: Denies mucositis or sore throat ?Respiratory: Denies cough, dyspnea or wheezes ?Cardiovascular: Denies palpitation, chest discomfort ?Gastrointestinal:  Denies nausea, heartburn or change in bowel habits ?Skin: Denies abnormal skin rashes ?Lymphatics: Denies new lymphadenopathy or easy bruising ?Neurological:Denies numbness, tingling or new weaknesses ?Behavioral/Psych: Mood is stable, no new changes  ?Extremities: No lower extremity edema ?Breast: *** denies any pain or lumps or nodules in either breasts ?All other systems were reviewed with the patient and are  negative. ?Observations/Objective:  ? ?  ?Assessment Plan:  ?No problem-specific Assessment  & Plan notes found for this encounter. ? ? ? ?I discussed the assessment and treatment plan with the patient. The patient was provided an opportunity to ask questions and all were answered. The patient agreed with the plan and demonstrated an understanding of the instructions. The patient was advised to call back or seek an in-person evaluation if the symptoms worsen or if the condition fails to improve as anticipated.  ? ?I provided *** minutes of non-face-to-face time during this encounter. Suzzette Righter, CMA   ? ?I Gardiner Coins am scribing for Dr. Lindi Adie ? ?***  ?

## 2021-07-17 LAB — MULTIPLE MYELOMA PANEL, SERUM
Albumin SerPl Elph-Mcnc: 3.9 g/dL (ref 2.9–4.4)
Albumin/Glob SerPl: 1.6 (ref 0.7–1.7)
Alpha 1: 0.2 g/dL (ref 0.0–0.4)
Alpha2 Glob SerPl Elph-Mcnc: 0.5 g/dL (ref 0.4–1.0)
B-Globulin SerPl Elph-Mcnc: 0.9 g/dL (ref 0.7–1.3)
Gamma Glob SerPl Elph-Mcnc: 1 g/dL (ref 0.4–1.8)
Globulin, Total: 2.6 g/dL (ref 2.2–3.9)
IgA: 104 mg/dL (ref 87–352)
IgG (Immunoglobin G), Serum: 1137 mg/dL (ref 586–1602)
IgM (Immunoglobulin M), Srm: 60 mg/dL (ref 26–217)
Total Protein ELP: 6.5 g/dL (ref 6.0–8.5)

## 2021-07-20 ENCOUNTER — Other Ambulatory Visit: Payer: Self-pay | Admitting: Radiology

## 2021-07-20 ENCOUNTER — Inpatient Hospital Stay: Payer: 59 | Admitting: Hematology and Oncology

## 2021-07-20 DIAGNOSIS — Z853 Personal history of malignant neoplasm of breast: Secondary | ICD-10-CM

## 2021-07-20 NOTE — Progress Notes (Signed)
 Patient Care Team: Sanders, Robyn, MD as PCP - General (Internal Medicine) Blackman, Christopher Y, MD as Consulting Physician (Orthopedic Surgery) Stuart, Dawn C, RN as Oncology Nurse Navigator Martini, Keisha N, RN as Oncology Nurse Navigator Cornett, Thomas, MD as Consulting Physician (General Surgery) Gudena, Vinay, MD as Consulting Physician (Hematology and Oncology) Squire, Sarah, MD as Attending Physician (Radiation Oncology)  DIAGNOSIS:  Encounter Diagnosis  Name Primary?   Malignant neoplasm of lower-outer quadrant of right breast of female, estrogen receptor positive ([facility]) Yes    SUMMARY OF ONCOLOGIC HISTORY: Oncology History  Malignant neoplasm of lower-outer quadrant of right breast of female, estrogen receptor positive ([facility])  05/03/2020 Initial Diagnosis   Screening mammogram showed a 1.0cm mass at the 6 o'clock position in the right breast. Diagnostic mammogram and US showed the 1.2cm mass at the 6 o'clock position in the right breast. Biopsy showed IDC, grade 3, HER-2 positive (3+), ER 15% weak, PR-, Ki67 80%.   05/11/2020 Cancer Staging   Staging form: Breast, AJCC 8th Edition - Clinical stage from 05/11/2020: Stage IA (cT1b, cN0, cM0, G3, ER+, PR-, HER2+) - Signed by Gudena, Vinay, MD on 05/11/2020 Stage prefix: Initial diagnosis    05/19/2020 Genetic Testing   Negative hereditary cancer genetic testing: no pathogenic variants detected in [facility] Breast STAT Panel and Common Hereditary Cancers Panel.  The report dates are May 19, 2020 (STAT) and May 23, 2020 (Common Hereditary).   The Common Hereditary Cancers Panel offered by [facility] includes sequencing and/or deletion duplication testing of the following 47 genes: APC, ATM, AXIN2, BARD1, BMPR1A, BRCA1, BRCA2, BRIP1, CDH1, CDK4, CDKN2A (p14ARF), CDKN2A (p16INK4a), CHEK2, CTNNA1, DICER1, EPCAM (Deletion/duplication testing only), GREM1 (promoter region deletion/duplication testing only), GREM1, HOXB13, KIT, MEN1, MLH1,  MSH2, MSH3, MSH6, MUTYH, NBN, NF1, NHTL1, PALB2, PDGFRA, PMS2, POLD1, POLE, PTEN, RAD50, RAD51C, RAD51D, SDHA, SDHB, SDHC, SDHD, SMAD4, SMARCA4. STK11, TP53, TSC1, TSC2, and VHL.  The following genes were evaluated for sequence changes only: SDHA and HOXB13 c.251G>A variant only.es only: SDHA and HOXB13 c.251G>A variant only.   06/02/2020 Surgery   Right lumpectomy ([facility]): invasive and in situ ductal carcinoma, 1.3cm, clear margins, 1 right axillary lymph node negative for carcinoma.   06/02/2020 Cancer Staging   Staging form: Breast, AJCC 8th Edition - Pathologic stage from 06/02/2020: Stage IA (pT1c, pN0, cM0, G3, ER+, PR-, HER2+) - Signed by Causey, Lindsey Cornetto, NP on 06/15/2020 Stage prefix: Initial diagnosis Histologic grading system: 3 grade system    07/08/2020 - 10/21/2020 Chemotherapy   Taxol Herceptin followed by Herceptin maintenance   11/11/2020 -  Chemotherapy   Herceptin maintenance       04/14/2021 -  Anti-estrogen oral therapy   Tamoxifen 10 mg     CHIEF COMPLIANT: Discuss Pathology report  INTERVAL HISTORY: Pamela [name] is a  56 y.o. with above-mentioned history of breast cancer treated with lumpectomy, completed adjuvant therapy with Taxol Herceptin and finished Herceptin maintenance.  She presented with a right supraclavicular lymphadenopathy and a biopsy of which confirmed that this is metastatic breast cancer that is poorly differentiated and it is ER weakly positive PR negative and HER2 positive.  She is accompanied today by her daughter.. She reports to the clinic today for follow-up.   ALLERGIES:  is allergic to pollen extract, latex, codeine, shellfish allergy, and betadine [povidone iodine].  MEDICATIONS:  Current Outpatient Medications  Medication Sig Dispense Refill   amLODipine (NORVASC) 10 MG tablet Take 1 tablet (10 mg total) by mouth daily. 90 tablet   2   aspirin EC 81 MG tablet Take 1 tablet (81 mg total) by mouth daily. Swallow whole. 30  tablet 11   Azilsartan-Chlorthalidone (EDARBYCLOR) 40-25 MG TABS Take 1 tablet by mouth daily. 90 tablet 2   hydrOXYzine (ATARAX) 25 MG tablet Take 1 tablet by mouth every 12 (twelve) hours as needed.     levocetirizine (XYZAL) 5 MG tablet Take 1 tablet (5 mg total) by mouth every evening. 90 tablet 0   levonorgestrel (MIRENA, 52 MG,) 20 MCG/DAY IUD 1 each by Intrauterine route once.     lidocaine-prilocaine (EMLA) cream Apply 1 application topically daily as needed. 60 g 0   montelukast (SINGULAIR) 10 MG tablet Take 1 tablet (10 mg total) by mouth daily. 90 tablet 0   OVER THE COUNTER MEDICATION Take 1 capsule by mouth daily. Medi weight loss enteric coated omega 3     OVER THE COUNTER MEDICATION Take 1 capsule by mouth daily. Medi weight loss Calcium four blend     OVER THE COUNTER MEDICATION Take 1 capsule by mouth daily. Medi weight loss fat burner     OVER THE COUNTER MEDICATION Take 1 capsule by mouth daily. Medi weight loss vita super     rosuvastatin (CRESTOR) 40 MG tablet Take 1 tablet (40 mg total) by mouth daily. 90 tablet 3   Semaglutide-Weight Management (WEGOVY) 0.5 MG/0.5ML SOAJ Inject 0.5 mg into the skin once a week. 2 mL 0   tamoxifen (NOLVADEX) 20 MG tablet Take 10 mg by mouth daily.     VITAMIN D, CHOLECALCIFEROL, PO Take 1 tablet by mouth daily.     Current Facility-Administered Medications  Medication Dose Route Frequency Provider Last Rate Last Admin   0.9 %  sodium chloride infusion   Intravenous PRN Ghumman, Ramandeep, NP       Facility-Administered Medications Ordered in Other Visits  Medication Dose Route Frequency Provider Last Rate Last Admin   heparin lock flush 100 unit/mL  500 Units Intracatheter Once Nicholas Lose, MD       sodium chloride flush (NS) 0.9 % injection 10 mL  10 mL Intracatheter Once Nicholas Lose, MD        PHYSICAL EXAMINATION: ECOG PERFORMANCE STATUS: 1 - Symptomatic but completely ambulatory  Vitals:   07/27/21 1338  BP: (!) 150/57   Pulse: 94  Resp: 19  Temp: (!) 97.2 F (36.2 C)  SpO2: 97%   Filed Weights   07/27/21 1338  Weight: (!) 308 lb 11.2 oz (140 kg)     LABORATORY DATA:  I have reviewed the data as listed    Latest Ref Rng & Units 07/21/2021   11:43 AM 07/12/2021    1:02 PM 07/07/2021    8:34 AM  CMP  Glucose 70 - 99 mg/dL 112   101   109    BUN 6 - 20 mg/dL _0 Creatinine 0.44 - 1.00 mg/dL 0.92   0.81   0.84    Sodium 135 - 145 mmol/L 141   140   142    Potassium 3.5 - 5.1 mmol/L 3.6   3.3   3.3    Chloride 98 - 111 mmol/L 104   103   107    CO2 22 - 32 mmol/L _1 Calcium 8.9 - 10.3 mg/dL 9.6   9.3   9.1    Total Protein 6.5 - 8.1 g/dL  7.2  7.1    Total Bilirubin 0.3 - 1.2 mg/dL  0.8   0.6    Alkaline Phos 38 - 126 U/L  52   54    AST 15 - 41 U/L  26   18    ALT 0 - 44 U/L  21   19      Lab Results  Component Value Date   WBC 6.9 07/21/2021   HGB 11.3 (L) 07/21/2021   HCT 36.0 07/21/2021   MCV 82.9 07/21/2021   PLT 223 07/21/2021   NEUTROABS 4.6 07/21/2021    ASSESSMENT & PLAN:  Malignant neoplasm of lower-outer quadrant of right breast of female, estrogen receptor positive ([place]) 05/03/2020:Screening mammogram showed a 1.0cm mass at the 6 o'clock position in the right breast. Diagnostic mammogram and US showed the 1.2cm mass at the 6 o'clock position in the right breast. Biopsy showed IDC, grade 3, HER-2 positive (3+), ER 15% weak, PR-, Ki67 80%.   06/02/2020:Right lumpectomy ([facility]): invasive and in situ ductal carcinoma, 1.3cm, clear margins, 1 right axillary lymph node negative for carcinoma. ER 15% weak, PR-,HER-2 positive (3+) Ki67 80%.   Treatment plan: 1.  Adjuvant chemotherapy with Taxol Herceptin followed by Herceptin maintenance.  Stopped after 11 cycles of Taxol Herceptin maintenance completed 07/07/2021  2.  Adjuvant radiation therapy 11/23/2020-12/19/2020 3.  Adjuvant antiestrogen therapy with tamoxifen started October 2022 4.  CT angiogram  07/13/2021: Right supraclavicular lymph node 5 cm, right internal mammary lymph nodes 1.8 cm ]----------------------------------------------------------------------------------------------------- Right supraclavicular lymphadenopathy: Biopsy: Metastatic poorly differentiated adenocarcinoma, ER +40%, PR negative, HER2 positive 3+  Plan: 1.  PET CT scan 2. consider chemotherapy with Enhertu  ENHERTU is a novel antibody drug conjugate between trastuzumab and SN 32.  Most recent clinical trial destiny 01 revealed a 60% response rate and median duration of response 14.8 months.  Major toxicities to be watchful of include interstitial lung disease, cytopenias, left ventricular dysfunction. The most common adverse reactions (frequency ?20%) were nausea (79%), fatigue (59%), vomiting (47%), alopecia (46%), constipation (35%), decreased appetite (32%), anemia (31%), neutropenia (29%), diarrhea (29%), leukopenia (22%), cough (20%), and thrombocytopenia (20%).  We will send the patient to [facility] for a second opinion. We would like to see her back after the PET scan to discuss results.      Orders Placed This Encounter  Procedures   NM PET Image Initial (PI) Skull Base To Thigh    Standing Status:   Future    Standing Expiration Date:   07/27/2022    Order Specific Question:   If indicated for the ordered procedure, I authorize the administration of a radiopharmaceutical per Radiology protocol    Answer:   Yes    Order Specific Question:   Is the patient pregnant?    Answer:   No    Order Specific Question:   Preferred imaging location?    Answer:   [facility]    Order Specific Question:   Release to patient    Answer:   Immediate   The patient has a good understanding of the overall plan. she agrees with it. she will call with any problems that may develop before the next visit here. Total time spent: 30 mins including face to face time and time spent for planning, charting and co-ordination of  care   Pamela Ohara, MD 07/27/21    I Gardiner Coins am scribing for Dr. Lindi [name]  I have reviewed the above documentation for accuracy and completeness, and I agree  with the above.

## 2021-07-21 ENCOUNTER — Other Ambulatory Visit (HOSPITAL_COMMUNITY): Payer: Self-pay

## 2021-07-21 ENCOUNTER — Encounter (HOSPITAL_COMMUNITY): Payer: Self-pay

## 2021-07-21 ENCOUNTER — Other Ambulatory Visit: Payer: Self-pay

## 2021-07-21 ENCOUNTER — Ambulatory Visit (HOSPITAL_COMMUNITY)
Admission: RE | Admit: 2021-07-21 | Discharge: 2021-07-21 | Disposition: A | Discharge status: Home / Self Care | Payer: 59 | Source: Ambulatory Visit | Attending: Hematology and Oncology | Admitting: Hematology and Oncology

## 2021-07-21 DIAGNOSIS — R221 Localized swelling, mass and lump, neck: Secondary | ICD-10-CM | POA: Diagnosis not present

## 2021-07-21 DIAGNOSIS — G43909 Migraine, unspecified, not intractable, without status migrainosus: Secondary | ICD-10-CM | POA: Insufficient documentation

## 2021-07-21 DIAGNOSIS — E559 Vitamin D deficiency, unspecified: Secondary | ICD-10-CM | POA: Insufficient documentation

## 2021-07-21 DIAGNOSIS — C50911 Malignant neoplasm of unspecified site of right female breast: Secondary | ICD-10-CM | POA: Diagnosis present

## 2021-07-21 DIAGNOSIS — Z853 Personal history of malignant neoplasm of breast: Secondary | ICD-10-CM

## 2021-07-21 DIAGNOSIS — E7800 Pure hypercholesterolemia, unspecified: Secondary | ICD-10-CM | POA: Diagnosis not present

## 2021-07-21 DIAGNOSIS — E669 Obesity, unspecified: Secondary | ICD-10-CM | POA: Diagnosis not present

## 2021-07-21 DIAGNOSIS — J45909 Unspecified asthma, uncomplicated: Secondary | ICD-10-CM | POA: Diagnosis not present

## 2021-07-21 DIAGNOSIS — R590 Localized enlarged lymph nodes: Secondary | ICD-10-CM | POA: Insufficient documentation

## 2021-07-21 DIAGNOSIS — G4730 Sleep apnea, unspecified: Secondary | ICD-10-CM | POA: Insufficient documentation

## 2021-07-21 DIAGNOSIS — N938 Other specified abnormal uterine and vaginal bleeding: Secondary | ICD-10-CM | POA: Insufficient documentation

## 2021-07-21 DIAGNOSIS — I10 Essential (primary) hypertension: Secondary | ICD-10-CM | POA: Insufficient documentation

## 2021-07-21 DIAGNOSIS — F419 Anxiety disorder, unspecified: Secondary | ICD-10-CM | POA: Insufficient documentation

## 2021-07-21 LAB — CBC WITH DIFFERENTIAL/PLATELET
Abs Immature Granulocytes: 0.04 10*3/uL (ref 0.00–0.07)
Basophils Absolute: 0 10*3/uL (ref 0.0–0.1)
Basophils Relative: 1 %
Eosinophils Absolute: 0.1 10*3/uL (ref 0.0–0.5)
Eosinophils Relative: 2 %
HCT: 36 % (ref 36.0–46.0)
Hemoglobin: 11.3 g/dL — ABNORMAL LOW (ref 12.0–15.0)
Immature Granulocytes: 1 %
Lymphocytes Relative: 21 %
Lymphs Abs: 1.4 10*3/uL (ref 0.7–4.0)
MCH: 26 pg (ref 26.0–34.0)
MCHC: 31.4 g/dL (ref 30.0–36.0)
MCV: 82.9 fL (ref 80.0–100.0)
Monocytes Absolute: 0.7 10*3/uL (ref 0.1–1.0)
Monocytes Relative: 10 %
Neutro Abs: 4.6 10*3/uL (ref 1.7–7.7)
Neutrophils Relative %: 65 %
Platelets: 223 10*3/uL (ref 150–400)
RBC: 4.34 MIL/uL (ref 3.87–5.11)
RDW: 14.8 % (ref 11.5–15.5)
WBC: 6.9 10*3/uL (ref 4.0–10.5)
nRBC: 0 % (ref 0.0–0.2)

## 2021-07-21 LAB — PROTIME-INR
INR: 1 (ref 0.8–1.2)
Prothrombin Time: 13 seconds (ref 11.4–15.2)

## 2021-07-21 LAB — BASIC METABOLIC PANEL
Anion gap: 8 (ref 5–15)
BUN: 14 mg/dL (ref 6–20)
CO2: 29 mmol/L (ref 22–32)
Calcium: 9.6 mg/dL (ref 8.9–10.3)
Chloride: 104 mmol/L (ref 98–111)
Creatinine, Ser: 0.92 mg/dL (ref 0.44–1.00)
GFR, Estimated: >60 mL/min (ref >60)
Glucose, Bld: 112 mg/dL — ABNORMAL HIGH (ref 70–99)
Potassium: 3.6 mmol/L (ref 3.5–5.1)
Sodium: 141 mmol/L (ref 135–145)

## 2021-07-21 MED ORDER — LIDOCAINE HCL 1 % IJ SOLN
INTRAMUSCULAR | Status: AC
  Filled 2021-07-21: qty 20

## 2021-07-21 MED ORDER — FENTANYL CITRATE (PF) 100 MCG/2ML IJ SOLN
INTRAMUSCULAR | Status: AC | PRN
  Administered 2021-07-21: 50 ug via INTRAVENOUS

## 2021-07-21 MED ORDER — HEPARIN SOD (PORK) LOCK FLUSH 100 UNIT/ML IV SOLN
500.0000 [IU] | INTRAVENOUS | Status: AC | PRN
  Administered 2021-07-21: 500 [IU]
  Filled 2021-07-21: qty 5

## 2021-07-21 MED ORDER — FENTANYL CITRATE (PF) 100 MCG/2ML IJ SOLN
INTRAMUSCULAR | Status: AC
  Filled 2021-07-21: qty 2

## 2021-07-21 MED ORDER — LIDOCAINE HCL 1 % IJ SOLN
INTRAMUSCULAR | Status: AC | PRN
  Administered 2021-07-21: 10 mL via INTRADERMAL

## 2021-07-21 MED ORDER — SODIUM CHLORIDE 0.9 % IV SOLN: INTRAVENOUS | Status: DC

## 2021-07-21 NOTE — Consult Note (Signed)
Chief Complaint: Patient was seen in consultation today for  image guided right supraclavicular lymph node biopsy  Referring Physician(s): Gudena,Vinay  Supervising Physician: Markus Daft  Patient Status: Pamela [name] - Out-pt  History of Present Illness: Pamela [name] is a 56 y.o. female with past medical history significant for anxiety, asthma, dysfunctional uterine bleeding, hypercholesterolemia, hypertension, migraines, obesity, sleep apnea, vitamin D deficiency and newly diagnosed right breast carcinoma, status post lumpectomy on 06/02/2020 as well as chemoradiation.  Recent CT revealed right supraclavicular and internal mammary adenopathy concerning for metastasis.  She presents today for image guided right supraclavicular lymph node biopsy for further evaluation.  Past Medical History:  Diagnosis Date   Anxiety    Asthma    Back pain    Breast cancer ([place])    Constipation    DUB (dysfunctional uterine bleeding) 2007   Edema of both lower extremities    Elevated cholesterol    Family history of breast cancer 05/12/2020   Family history of lung cancer 05/12/2020   Food allergy    H/O menorrhagia 05/01/2006   High cholesterol    History of ovarian cyst 2007   Hypertension 03/31/2005   Increased BMI 07/11/2006   Joint pain    Migraine    N&V (nausea and vomiting)    Obesity 2007   Obstructive sleep apnea syndrome, moderate 10/27/2013   no cpap   Sleep apnea    SOB (shortness of breath)    Vitamin D deficiency    Vitamin D deficiency    Weight loss 07/11/2006    Past Surgical History:  Procedure Laterality Date   BREAST LUMPECTOMY WITH RADIOACTIVE SEED AND SENTINEL LYMPH NODE BIOPSY Right 06/02/2020   Procedure: RIGHT BREAST LUMPECTOMY WITH RADIOACTIVE SEED AND RIGHT SENTINEL LYMPH NODE BIOPSY;  Surgeon: [name], MD;  Location: [facility];  Service: General;  Laterality: Right;   BREAST REDUCTION SURGERY  05/1991   CESAREAN SECTION      HYSTEROSCOPY  05/01/2006   PORTACATH PLACEMENT Right 06/02/2020   Procedure: INSERTION PORT-A-CATH;  Surgeon: [name], MD;  Location: [facility];  Service: General;  Laterality: Right;    Allergies: Pollen extract, Latex, Codeine, Shellfish allergy, and Betadine [povidone iodine]  Medications: Prior to Admission medications   Medication Sig Start Date End Date Taking? Authorizing Provider  amLODipine (NORVASC) 10 MG tablet Take 1 tablet (10 mg total) by mouth daily. 12/22/20  Yes [name], MD  Azilsartan-Chlorthalidone (EDARBYCLOR) 40-25 MG TABS Take 1 tablet by mouth daily. 12/22/20  Yes [name], MD  hydrOXYzine (ATARAX) 25 MG tablet Take 1 tablet by mouth every 12 (twelve) hours as needed. 01/19/21  Yes [provider]  rosuvastatin (CRESTOR) 40 MG tablet Take 1 tablet (40 mg total) by mouth daily. 06/29/20  Yes Lorretta Harp, MD  tamoxifen (NOLVADEX) 20 MG tablet Take 10 mg by mouth daily.   Yes [provider]  aspirin EC 81 MG tablet Take 1 tablet (81 mg total) by mouth daily. Swallow whole. 09/18/19   Elgergawy, Silver Huguenin, MD  levocetirizine (XYZAL) 5 MG tablet Take 1 tablet (5 mg total) by mouth every evening. 06/23/21   [name], MD  levonorgestrel (MIRENA, 52 MG,) 20 MCG/DAY IUD 1 each by Intrauterine route once.    [provider]  lidocaine-prilocaine (EMLA) cream Apply 1 application topically daily as needed. 06/07/21   Causey, Charlestine Massed, NP  montelukast (SINGULAIR) 10 MG tablet Take 1 tablet (10 mg total) by  mouth daily. 06/23/21 06/23/22  [name], MD  OVER THE COUNTER MEDICATION Take 1 capsule by mouth daily. Medi weight loss enteric coated omega 3    [provider]  OVER THE COUNTER MEDICATION Take 1 capsule by mouth daily. Medi weight loss Calcium four blend    [provider]  OVER THE COUNTER MEDICATION Take 1 capsule by mouth daily. Medi weight loss fat burner    [provider]  OVER THE COUNTER MEDICATION Take 1 capsule by mouth daily. Medi weight loss vita super    [provider]  Semaglutide-Weight Management (WEGOVY) 0.5 MG/0.5ML SOAJ Inject 0.5 mg into the skin once a week. 07/03/21   [name], MD  VITAMIN D, CHOLECALCIFEROL, PO Take 1 tablet by mouth daily.    [provider]     Family History  Problem Relation Age of Onset   Hypertension Mother    High Cholesterol Mother    Thyroid disease Mother    Cancer Mother    Sleep apnea Mother    Hypertension Father    Heart attack Father    Sudden death Father    Diabetes Maternal Grandmother    Bone cancer Maternal Grandfather        dx after 98   Breast cancer Maternal Aunt        dx 5s   Lung cancer Maternal Aunt        dx after 109    Social History   Socioeconomic History   Marital status: Single    Spouse name: Not on file   Number of children: Not on file   Years of education: Not on file   Highest education level: Not on file  Occupational History   Occupation: Education officer, museum  Tobacco Use   Smoking status: Never   Smokeless tobacco: Never  Vaping Use   Vaping Use: Never used  Substance and Sexual Activity   Alcohol use: No   Drug use: Not Currently    Frequency: 2.0 times per week   Sexual activity: Not Currently    Birth control/protection: I.U.D.    Comment: Mirena  Other Topics Concern   Not on file  Social History Narrative   Not on file   Social Determinants of Health   Financial Resource Strain: Not on file  Food Insecurity: Not on file  Transportation Needs: Not on file  Physical Activity: Not on file  Stress: Not on file  Social Connections: Not on file      Review of Systems denies fever, headache, chest pain, dyspnea, cough, abdominal/back pain, nausea, vomiting or bleeding.  She does have some right neck/supraclavicular region swelling/adenopathy  Vital Signs: Vitals:   07/21/21 1202  BP: (!) 162/78  Resp: 18   Temp: 98.3 F (36.8 C)  SpO2: 100%      Physical Exam awake, alert.  Chest clear to auscultation bilaterally.  Intact right chest wall Port-A-Cath.  Noted edema of right neck and palpable right supraclavicular node; heart with regular rate and rhythm.  Abdomen soft, positive bowel sounds, nontender.  Trace pretibial edema bilaterally  Imaging: CT Angio Chest Pulmonary Embolism (PE) W or WO Contrast  Result Date: 07/13/2021 CLINICAL DATA:  Shortness of breath. Status post therapy for breast cancer. Lower back swelling. * Tracking Code: BO * EXAM: CT ANGIOGRAPHY CHEST WITH CONTRAST TECHNIQUE: Multidetector CT imaging of the chest was performed using the standard protocol during bolus administration of intravenous contrast. Multiplanar CT image reconstructions and MIPs were  obtained to evaluate the vascular anatomy. RADIATION DOSE REDUCTION: This exam was performed according to the departmental dose-optimization program which includes automated exposure control, adjustment of the mA and/or kV according to patient size and/or use of iterative reconstruction technique. CONTRAST:  63m OMNIPAQUE IOHEXOL 350 MG/ML SOLN COMPARISON:  10/03/2020 calcium score CT. No prior diagnostic chest CTs. FINDINGS: Cardiovascular: The quality of this exam for evaluation of pulmonary embolism is poor. In addition to motion, the bolus is poorly timed, with suboptimal pulmonary artery opacification. No central or large lobar embolism identified. Right-sided Port-A-Cath tip at mid right atrium. Aortic atherosclerosis. Mild cardiomegaly. Mediastinum/Nodes: Right supraclavicular adenopathy at 3.5 x 5.0 cm on 09/04. Surgical clips in the lateral right breast versus superficial right axilla. No axillary or subpectoral adenopathy. No middle mediastinal or hilar adenopathy. Right internal mammary adenopathy at 1.8 cm on 33/4. Lungs/Pleura: No pleural fluid. Minimal motion degradation throughout. Upper Abdomen: Normal imaged portions  of the liver, spleen, stomach, pancreas, gallbladder, adrenal glands, kidneys. Musculoskeletal: Right-sided lumpectomy changes medially. No acute osseous abnormality. Lower cervical spondylosis. Review of the MIP images confirms the above findings. IMPRESSION: 1. Poor quality evaluation for pulmonary embolism. No central or large lobar embolism identified. 2. Right supraclavicular and internal mammary nodal metastasis. 3. Cardiomegaly. 4.  Aortic Atherosclerosis (ICD10-I70.0). Electronically Signed   By: KAbigail MiyamotoM.D.   On: 07/13/2021 11:50   ECHOCARDIOGRAM COMPLETE  Result Date: 06/22/2021    ECHOCARDIOGRAM REPORT   Patient Name:   BMAKINSLEY SCHIAVIDate of Exam: 06/22/2021 Medical Rec #:  [id number]         Height:       69.0 in Accession #:    [id number]        Weight:       306.5 lb Date of Birth:  [birth year]         BSA:          2.477 m Patient Age:    89years           BP:           137/69 mmHg Patient Gender: F                  HR:           75 bpm. Exam Location:  Outpatient Procedure: 2D Echo, Cardiac Doppler, Color Doppler and Strain Analysis Indications:    CHF  History:        Patient has prior history of Echocardiogram examinations. Risk                 Factors:Hypertension and Sleep Apnea.  Sonographer:    TJyl HeinzReferring Phys: [id number] DANIEL R BClaiborne 1. Left ventricular ejection fraction, by estimation, is 55%%. The left ventricle has normal function. The left ventricle has no regional wall motion abnormalities. There is mild concentric left ventricular hypertrophy. Left ventricular diastolic parameters are consistent with Grade I diastolic dysfunction (impaired relaxation). The average left ventricular global longitudinal strain is 19.9 %. The global longitudinal strain is normal.  2. Right ventricular systolic function is normal. The right ventricular size is normal.  3. Left atrial size was mildly dilated.  4. The mitral valve is normal in structure. Trivial mitral  valve regurgitation. No evidence of mitral stenosis.  5. The aortic valve is normal in structure. Aortic valve regurgitation is not visualized. Aortic valve sclerosis/calcification is present, without any evidence of aortic stenosis.  6. The inferior vena  cava is normal in size with greater than 50% respiratory variability, suggesting right atrial pressure of 3 mmHg. FINDINGS  Left Ventricle: Left ventricular ejection fraction, by estimation, is 55%%. The left ventricle has normal function. The left ventricle has no regional wall motion abnormalities. The average left ventricular global longitudinal strain is 19.9 %. The global longitudinal strain is normal. The left ventricular internal cavity size was normal in size. There is mild concentric left ventricular hypertrophy. Left ventricular diastolic parameters are consistent with Grade I diastolic dysfunction (impaired relaxation). Right Ventricle: The right ventricular size is normal. No increase in right ventricular wall thickness. Right ventricular systolic function is normal. Left Atrium: Left atrial size was mildly dilated. Right Atrium: Right atrial size was normal in size. Pericardium: There is no evidence of pericardial effusion. Mitral Valve: The mitral valve is normal in structure. Trivial mitral valve regurgitation. No evidence of mitral valve stenosis. Tricuspid Valve: The tricuspid valve is normal in structure. Tricuspid valve regurgitation is trivial. No evidence of tricuspid stenosis. Aortic Valve: The aortic valve is normal in structure. Aortic valve regurgitation is not visualized. Aortic valve sclerosis/calcification is present, without any evidence of aortic stenosis. Aortic valve peak gradient measures 11.7 mmHg. Pulmonic Valve: The pulmonic valve was grossly normal. Pulmonic valve regurgitation is not visualized. No evidence of pulmonic stenosis. Aorta: The aortic root is normal in size and structure. Venous: The inferior vena cava is normal in  size with greater than 50% respiratory variability, suggesting right atrial pressure of 3 mmHg. IAS/Shunts: No atrial level shunt detected by color flow Doppler.  LEFT VENTRICLE PLAX 2D LVIDd:         4.90 cm      Diastology LVIDs:         3.70 cm      LV e' medial:    5.70 cm/s LV PW:         1.30 cm      LV E/e' medial:  14.8 LV IVS:        1.10 cm      LV e' lateral:   5.17 cm/s LVOT diam:     2.00 cm      LV E/e' lateral: 16.3 LV SV:         73 LV SV Index:   30           2D Longitudinal Strain LVOT Area:     3.14 cm     2D Strain GLS Avg:     19.9 %  LV Volumes (MOD) LV vol d, MOD A2C: 121.0 ml 3D Volume EF: LV vol d, MOD A4C: 202.0 ml 3D EF:        56 % LV vol s, MOD A2C: 59.6 ml  LV EDV:       175 ml LV vol s, MOD A4C: 94.5 ml  LV ESV:       78 ml LV SV MOD A2C:     61.4 ml  LV SV:        97 ml LV SV MOD A4C:     202.0 ml LV SV MOD BP:      89.3 ml RIGHT VENTRICLE             IVC RV Basal diam:  3.60 cm     IVC diam: 1.50 cm RV Mid diam:    2.70 cm RV S prime:     11.70 cm/s TAPSE (M-mode): 2.8 cm LEFT ATRIUM  Index        RIGHT ATRIUM           Index LA diam:        3.70 cm 1.49 cm/m   RA Area:     17.70 cm LA Vol (A2C):   49.6 ml 20.02 ml/m  RA Volume:   48.30 ml  19.50 ml/m LA Vol (A4C):   43.9 ml 17.72 ml/m LA Biplane Vol: 48.3 ml 19.50 ml/m  AORTIC VALVE AV Area (Vmax): 2.09 cm AV Vmax:        171.00 cm/s AV Peak Grad:   11.7 mmHg LVOT Vmax:      114.00 cm/s LVOT Vmean:     82.000 cm/s LVOT VTI:       0.233 m  AORTA Ao Root diam: 3.10 cm Ao Asc diam:  3.10 cm MITRAL VALVE MV Area (PHT): 4.01 cm     SHUNTS MV Decel Time: 189 msec     Systemic VTI:  0.23 m MV E velocity: 84.40 cm/s   Systemic Diam: 2.00 cm MV A velocity: 104.00 cm/s MV E/A ratio:  0.81 Glori Bickers MD Electronically signed by Glori Bickers MD Signature Date/Time: 06/22/2021/9:51:25 AM    Final    VAS [place] UPPER EXTREMITY VENOUS DUPLEX  Result Date: 07/13/2021 UPPER VENOUS STUDY  Patient Name:  Pamela [name]   Date of Exam:   07/13/2021 Medical Rec #: [id number]           Accession #:    [id number] Date of Birth: 01/07/1966           Patient Gender: F Patient Age:   19 years Exam Location:  [name] Procedure:      VAS [place] UPPER EXTREMITY VENOUS DUPLEX Referring Phys: Sherol Dade --------------------------------------------------------------------------------  Indications: Swelling Risk Factors: Cancer. Limitations: Poor ultrasound/tissue interface. Comparison Study: No prior studies. Performing Technologist: Oliver Hum RVT  Examination Guidelines: A complete evaluation includes B-mode imaging, spectral Doppler, color Doppler, and power Doppler as needed of all accessible portions of each vessel. Bilateral testing is considered an integral part of a complete examination. Limited examinations for reoccurring indications may be performed as noted.  Right Findings: +----------+------------+---------+-----------+----------+-------+ RIGHT     CompressiblePhasicitySpontaneousPropertiesSummary +----------+------------+---------+-----------+----------+-------+ IJV           Full       Yes       Yes                      +----------+------------+---------+-----------+----------+-------+ Subclavian    Full       Yes       Yes                      +----------+------------+---------+-----------+----------+-------+ Axillary      Full       Yes       Yes                      +----------+------------+---------+-----------+----------+-------+ Brachial      Full       Yes       Yes                      +----------+------------+---------+-----------+----------+-------+ Radial        Full                                          +----------+------------+---------+-----------+----------+-------+  Ulnar         Full                                          +----------+------------+---------+-----------+----------+-------+ Cephalic      Full                                           +----------+------------+---------+-----------+----------+-------+ Basilic       Full                                          +----------+------------+---------+-----------+----------+-------+  Left Findings: +----------+------------+---------+-----------+----------+-------+ LEFT      CompressiblePhasicitySpontaneousPropertiesSummary +----------+------------+---------+-----------+----------+-------+ Subclavian    Full       Yes       Yes                      +----------+------------+---------+-----------+----------+-------+  Summary:  Right: No evidence of deep vein thrombosis in the upper extremity. No evidence of superficial vein thrombosis in the upper extremity.  Left: No evidence of thrombosis in the subclavian.  *See table(s) above for measurements and observations.  Diagnosing physician: Jamelle Haring Electronically signed by Jamelle Haring on 07/13/2021 at 4:43:50 PM.    Final     Labs:  CBC: Recent Labs    05/26/21 0837 06/16/21 0851 07/07/21 0834 07/12/21 1302  WBC 6.2 5.9 6.1 6.0  HGB 10.9* 10.9* 10.5* 10.9*  HCT 34.1* 34.3* 32.3* 33.5*  PLT 215 205 213 218    COAGS: No results for input(s): INR, APTT in the last 8760 hours.  BMP: Recent Labs    05/26/21 0837 06/16/21 0851 07/07/21 0834 07/12/21 1302  NA 142 140 142 140  K 3.5 3.5 3.3* 3.3*  CL 106 103 107 103  CO2 31 32 30 30  GLUCOSE 111* 104* 109* 101*  BUN '13 17 12 14  '$ CALCIUM 9.1 9.2 9.1 9.3  CREATININE 0.86 0.90 0.84 0.81  GFRNONAA >60 >60 >60 >60    LIVER FUNCTION TESTS: Recent Labs    05/26/21 0837 06/16/21 0851 07/07/21 0834 07/12/21 1302  BILITOT 0.7 0.6 0.6 0.8  AST '15 19 18 26  '$ ALT '13 19 19 21  '$ ALKPHOS 57 55 54 52  PROT 6.8 7.0 7.1 7.2  ALBUMIN 3.9 4.0 3.9 3.7    TUMOR MARKERS: No results for input(s): AFPTM, CEA, CA199, CHROMGRNA in the last 8760 hours.  Assessment and Plan: 56 y.o. female with past medical history significant for anxiety, asthma,  dysfunctional uterine bleeding, hypercholesterolemia, hypertension, migraines, obesity, sleep apnea, vitamin D deficiency and newly diagnosed right breast carcinoma, status post lumpectomy on 06/02/2020 as well as chemoradiation. Recent CT revealed right supraclavicular and internal mammary adenopathy concerning for metastasis.  She presents today for image guided right supraclavicular lymph node biopsy for further evaluation.Risks and benefits of procedure was discussed with the patient  including, but not limited to bleeding, infection, damage to adjacent structures or low yield requiring additional tests.  All of the questions were answered and there is agreement to proceed.  Consent signed and in chart.    Thank you for this interesting consult.  I greatly enjoyed meeting Pamela [name] and  look forward to participating in their care.  A copy of this report was sent to the requesting provider on this date.  Electronically Signed: D. Rowe Robert, PA-C 07/21/2021, 11:59 AM   I spent a total of 20 minutes  in face to face in clinical consultation, greater than 50% of which was counseling/coordinating care for image guided right supraclavicular lymph node biopsy

## 2021-07-21 NOTE — Procedures (Signed)
Interventional Radiology Procedure:   Indications: Breast cancer with right supraclavicular lymphadenopathy  Procedure: US guided biopsy of right supraclavicular lymph node  Findings: Enlarged right supraclavicular nodes.  Multiple core biopsies obtained from the dominant lymph node.   Complications: No immediate complications noted.     EBL: Minimal  Plan: Discharge to home in 1 hour   Lenox Bink R. Anselm Pancoast, MD  Pager: [phone]

## 2021-07-21 NOTE — Discharge Instructions (Signed)
Please call Interventional Radiology clinic [phone] with any questions or concerns.   You may remove your dressing and shower tomorrow.    Needle Biopsy, Care After These instructions tell you how to care for yourself after your procedure. Your doctor may also give you more specific instructions. Call your doctor if youhave any problems or questions. What can I expect after the procedure? After the procedure, it is common to have: Soreness. Bruising. Mild pain. Follow these instructions at home:  Return to your normal activities as told by your doctor. Ask your doctor what activities are safe for you. Take over-the-counter and prescription medicines only as told by your doctor. Wash your hands with soap and water before you change your bandage (dressing). If you cannot use soap and water, use hand sanitizer. Follow instructions from your doctor about: How to take care of your puncture site. When and how to change your bandage. When to remove your bandage. Check your puncture site every day for signs of infection. Watch for: Redness, swelling, or pain. Fluid or blood. Pus or a bad smell. Warmth. Do not take baths, swim, or use a hot tub until your doctor approves. Ask your doctor if you may take showers. You may only be allowed to take sponge baths. Keep all follow-up visits as told by your doctor. This is important. Contact a doctor if you have: A fever. Redness, swelling, or pain at the puncture site, and it lasts longer than a few days. Fluid, blood, or pus coming from the puncture site. Warmth coming from the puncture site. Get help right away if: You have a lot of bleeding from the puncture site. Summary After the procedure, it is common to have soreness, bruising, or mild pain at the puncture site. Check your puncture site every day for signs of infection, such as redness, swelling, or pain. Get help right away if you have severe bleeding from your puncture  site. This information is not intended to replace advice given to you by your health care provider. Make sure you discuss any questions you have with your healthcare provider. Document Revised: 08/20/2019 Document Reviewed: 08/20/2019 Elsevier Patient Education  2022 [employer] Inc.   Moderate Conscious Sedation, Adult, Care After This sheet gives you information about how to care for yourself after your procedure. Your health care provider may also give you more specific instructions. If you have problems or questions, contact your health care provider. What can I expect after the procedure? After the procedure, it is common to have: Sleepiness for several hours. Impaired judgment for several hours. Difficulty with balance. Vomiting if you eat too soon. Follow these instructions at home: For the time period you were told by your health care provider: Rest. Do not participate in activities where you could fall or become injured. Do not drive or use machinery. Do not drink alcohol. Do not take sleeping pills or medicines that cause drowsiness. Do not make important decisions or sign legal documents. Do not take care of children on your own.      Eating and drinking Follow the diet recommended by your health care provider. Drink enough fluid to keep your urine pale yellow. If you vomit: Drink water, juice, or soup when you can drink without vomiting. Make sure you have little or no nausea before eating solid foods.   General instructions Take over-the-counter and prescription medicines only as told by your health care provider. Have a responsible adult stay with you for the time you are told.   It is important to have someone help care for you until you are awake and alert. Do not smoke. Keep all follow-up visits as told by your health care provider. This is important. Contact a health care provider if: You are still sleepy or having trouble with balance after 24 hours. You feel  light-headed. You keep feeling nauseous or you keep vomiting. You develop a rash. You have a fever. You have redness or swelling around the IV site. Get help right away if: You have trouble breathing. You have new-onset confusion at home. Summary After the procedure, it is common to feel sleepy, have impaired judgment, or feel nauseous if you eat too soon. Rest after you get home. Know the things you should not do after the procedure. Follow the diet recommended by your health care provider and drink enough fluid to keep your urine pale yellow. Get help right away if you have trouble breathing or new-onset confusion at home. This information is not intended to replace advice given to you by your health care provider. Make sure you discuss any questions you have with your health care provider. Document Revised: 06/19/2019 Document Reviewed: 01/15/2019 Elsevier Patient Education  2021 Elsevier Inc.     

## 2021-07-25 LAB — SURGICAL PATHOLOGY

## 2021-07-26 ENCOUNTER — Other Ambulatory Visit: Payer: Self-pay | Admitting: Cardiovascular Disease

## 2021-07-27 ENCOUNTER — Other Ambulatory Visit: Payer: Self-pay | Admitting: *Deleted

## 2021-07-27 ENCOUNTER — Other Ambulatory Visit: Payer: Self-pay

## 2021-07-27 ENCOUNTER — Inpatient Hospital Stay: Payer: 59 | Admitting: Hematology and Oncology

## 2021-07-27 VITALS — BP 150/57 | HR 94 | Temp 97.2°F | Resp 19 | Ht 69.0 in | Wt 308.7 lb

## 2021-07-27 DIAGNOSIS — Z170 Estrogen receptor positive status [ER+]: Secondary | ICD-10-CM

## 2021-07-27 DIAGNOSIS — C50511 Malignant neoplasm of lower-outer quadrant of right female breast: Secondary | ICD-10-CM

## 2021-07-27 DIAGNOSIS — Z5112 Encounter for antineoplastic immunotherapy: Secondary | ICD-10-CM | POA: Diagnosis not present

## 2021-07-27 NOTE — Assessment & Plan Note (Addendum)
05/03/2020:Screening mammogram showed a 1.0cm mass at the 6 o'clock position in the right breast. Diagnostic mammogram and US showed the 1.2cm mass at the 6 o'clock position in the right breast. Biopsy showed IDC, grade 3, HER-2 positive (3+), ER 15% weak, PR-, Ki67 80%.  06/02/2020:Right lumpectomy ([facility]): invasive and in situ ductal carcinoma, 1.3cm, clear margins, 1 right axillary lymph node negative for carcinoma. ER 15% weak, PR-,HER-2 positive (3+) Ki67 80%.  Treatment plan: 1.Adjuvant chemotherapy with Taxol Herceptinfollowed byHerceptin maintenance.Stopped after 11 cycles of Taxol Herceptin maintenance completed 07/07/2021 2.Adjuvant radiation therapy9/21/2022-12/19/2020 3.Adjuvant antiestrogen therapy with tamoxifen started October 2022 4.  CT angiogram 07/13/2021: Right supraclavicular lymph node 5 cm, right internal mammary lymph nodes 1.8 cm ]----------------------------------------------------------------------------------------------------- Right supraclavicular lymphadenopathy: Biopsy: Metastatic poorly differentiated adenocarcinoma, ER +40%, PR negative, HER2 positive 3+  Plan: 1.  PET CT scan 2. consider chemotherapy with Enhertu  ENHERTU is a novel antibody drug conjugate between trastuzumab and SN 32.  Most recent clinical trial destiny 01 revealed a 60% response rate and median duration of response 14.8 months.  Major toxicities to be watchful of include interstitial lung disease, cytopenias, left ventricular dysfunction. The most common adverse reactions (frequency ?20%) were nausea (79%), fatigue (59%), vomiting (47%), alopecia (46%), constipation (35%), decreased appetite (32%), anemia (31%), neutropenia (29%), diarrhea (29%), leukopenia (22%), cough (20%), and thrombocytopenia (20%).  We will send the patient to [facility] for a second opinion. We would like to see her back after the PET scan to discuss results.

## 2021-07-28 ENCOUNTER — Telehealth: Payer: Self-pay

## 2021-07-28 ENCOUNTER — Telehealth: Payer: Self-pay | Admitting: Hematology and Oncology

## 2021-07-28 ENCOUNTER — Telehealth: Payer: Self-pay | Admitting: Adult Health

## 2021-07-28 NOTE — Telephone Encounter (Signed)
Scheduled appointment per 5/25 los. Patient is aware.

## 2021-07-28 NOTE — Telephone Encounter (Signed)
Return call to pt, confirmed per MD, pt should continue taking tamoxifen.  Pt verbalized understanding and thanks

## 2021-07-28 NOTE — Telephone Encounter (Signed)
Called patient insurance company for peer to peer and received authorization for her PET scan which expires September 11, 2021.  Authorization number: [id number]  Time spent: 5 minutes  Wilber Bihari, NP 07/28/21 3:18 PM Medical Oncology and Hematology [facility] [place],  [address] Tel. [phone]    Fax. [phone]

## 2021-08-02 ENCOUNTER — Other Ambulatory Visit: Payer: Self-pay | Admitting: *Deleted

## 2021-08-02 DIAGNOSIS — C50511 Malignant neoplasm of lower-outer quadrant of right female breast: Secondary | ICD-10-CM

## 2021-08-02 NOTE — Progress Notes (Signed)
HEMATOLOGY-ONCOLOGY TELEPHONE VISIT PROGRESS NOTE  I connected with @PTNAME @ on 08/14/21 at 10:00 AM EDT by telephone and verified that I am speaking with the correct person using two identifiers.  I discussed the limitations, risks, security and privacy concerns of performing an evaluation and management service by telephone and the availability of in person appointments.  I also discussed with the patient that there may be a patient responsible charge related to this service. The patient expressed understanding and agreed to proceed.   History of Present Illness: Pamela [name] is a  56 y.o. with above-mentioned history of breast cancer. She reports to the clinic today via telephone follow-up. She went to [facility] for a second opinion and they also recommended Enhertu.    Oncology History  Malignant neoplasm of lower-outer quadrant of right breast of female, estrogen receptor positive ([place])  05/03/2020 Initial Diagnosis   Screening mammogram showed a 1.0cm mass at the 6 o'clock position in the right breast. Diagnostic mammogram and US showed the 1.2cm mass at the 6 o'clock position in the right breast. Biopsy showed IDC, grade 3, HER-2 positive (3+), ER 15% weak, PR-, Ki67 80%.   05/11/2020 Cancer Staging   Staging form: Breast, AJCC 8th Edition - Clinical stage from 05/11/2020: Stage IA (cT1b, cN0, cM0, G3, ER+, PR-, HER2+) - Signed by Nicholas Lose, MD on 05/11/2020 Stage prefix: Initial diagnosis   05/19/2020 Genetic Testing   Negative hereditary cancer genetic testing: no pathogenic variants detected in [facility] Breast STAT Panel and Common Hereditary Cancers Panel.  The report dates are May 19, 2020 (STAT) and May 23, 2020 (Common Hereditary).   The Common Hereditary Cancers Panel offered by [facility] includes sequencing and/or deletion duplication testing of the following 47 genes: APC, ATM, AXIN2, BARD1, BMPR1A, BRCA1, BRCA2, BRIP1, CDH1, CDK4, CDKN2A (p14ARF), CDKN2A (p16INK4a), CHEK2, CTNNA1,  DICER1, EPCAM (Deletion/duplication testing only), GREM1 (promoter region deletion/duplication testing only), GREM1, HOXB13, KIT, MEN1, MLH1, MSH2, MSH3, MSH6, MUTYH, NBN, NF1, NHTL1, PALB2, PDGFRA, PMS2, POLD1, POLE, PTEN, RAD50, RAD51C, RAD51D, SDHA, SDHB, SDHC, SDHD, SMAD4, SMARCA4. STK11, TP53, TSC1, TSC2, and VHL.  The following genes were evaluated for sequence changes only: SDHA and HOXB13 c.251G>A variant only.es only: SDHA and HOXB13 c.251G>A variant only.   06/02/2020 Surgery   Right lumpectomy ([facility]): invasive and in situ ductal carcinoma, 1.3cm, clear margins, 1 right axillary lymph node negative for carcinoma.   06/02/2020 Cancer Staging   Staging form: Breast, AJCC 8th Edition - Pathologic stage from 06/02/2020: Stage IA (pT1c, pN0, cM0, G3, ER+, PR-, HER2+) - Signed by Gardenia Phlegm, NP on 06/15/2020 Stage prefix: Initial diagnosis Histologic grading system: 3 grade system   07/08/2020 - 10/21/2020 Chemotherapy   Taxol Herceptin followed by Herceptin maintenance   11/11/2020 -  Chemotherapy   Herceptin maintenance       04/14/2021 -  Anti-estrogen oral therapy   Tamoxifen 10 mg   08/21/2021 -  Chemotherapy   Patient is on Treatment Plan : BREAST METASTATIC fam-trastuzumab deruxtecan-nxki (Enhertu) q21d       REVIEW OF SYSTEMS:   Constitutional: Denies fevers, chills or abnormal weight loss All other systems were reviewed with the patient and are negative. Observations/Objective:     Assessment Plan:  Malignant neoplasm of lower-outer quadrant of right breast of female, estrogen receptor positive ([place]) 05/03/2020:Screening mammogram showed a 1.0cm mass at the 6 o'clock position in the right breast. Diagnostic mammogram and US showed the 1.2cm mass at the 6 o'clock position in the right breast.  Biopsy showed IDC, grade 3, HER-2 positive (3+), ER 15% weak, PR-, Ki67 80%.   06/02/2020:Right lumpectomy ([facility]): invasive and in situ ductal carcinoma, 1.3cm, clear  margins, 1 right axillary lymph node negative for carcinoma. ER 15% weak, PR-,HER-2 positive (3+) Ki67 80%.   Treatment plan: 1.  Adjuvant chemotherapy with Taxol Herceptin followed by Herceptin maintenance.  Stopped after 11 cycles of Taxol Herceptin maintenance completed 07/07/2021  2.  Adjuvant radiation therapy 11/23/2020-12/19/2020 3.  Adjuvant antiestrogen therapy with tamoxifen started October 2022 4.  CT angiogram 07/13/2021: Right supraclavicular lymph node 5 cm, right internal mammary lymph nodes 1.8 cm ]----------------------------------------------------------------------------------------------------- Right supraclavicular lymphadenopathy: Biopsy: Metastatic poorly differentiated adenocarcinoma, ER +40%, PR negative, HER2 positive 3+   Plan: 1.  PET CT scan 08/06/21: Ext Right Supra clav LN along with Internal mammary and Rt Upper Mediastinal and Rt Axillary LN 2. Consider chemotherapy with Enhertu  Patient went to [facility] for a second opinion who agreed with the plan. Start Enhertu on 6/19   I discussed the assessment and treatment plan with the patient. The patient was provided an opportunity to ask questions and all were answered. The patient agreed with the plan and demonstrated an understanding of the instructions. The patient was advised to call back or seek an in-person evaluation if the symptoms worsen or if the condition fails to improve as anticipated.   I provided 14 minutes of non-face-to-face time during this encounter. Harriette Ohara, MD   I Gardiner Coins am scribing for Dr. Lindi Adie  I have reviewed the above documentation for accuracy and completeness, and I agree with the above.

## 2021-08-02 NOTE — Progress Notes (Signed)
Pt states she is currently scheduled for a second opinion with [facility] 08/10/21.  Pt requesting for a 3rd opinion be faxed to [facility].  Verbal orders received from MD and RN successfully faxed referral to [facility] [phone]).

## 2021-08-04 ENCOUNTER — Ambulatory Visit (HOSPITAL_COMMUNITY)
Admission: RE | Admit: 2021-08-04 | Discharge: 2021-08-04 | Disposition: A | Discharge status: Home / Self Care | Payer: 59 | Source: Ambulatory Visit | Attending: Hematology and Oncology | Admitting: Hematology and Oncology

## 2021-08-04 DIAGNOSIS — Z170 Estrogen receptor positive status [ER+]: Secondary | ICD-10-CM | POA: Diagnosis present

## 2021-08-04 DIAGNOSIS — C50511 Malignant neoplasm of lower-outer quadrant of right female breast: Secondary | ICD-10-CM | POA: Diagnosis present

## 2021-08-04 LAB — GLUCOSE, CAPILLARY: Glucose-Capillary: 118 mg/dL — ABNORMAL HIGH (ref 70–99)

## 2021-08-04 MED ORDER — FLUDEOXYGLUCOSE F - 18 (FDG) INJECTION
15.4000 | Freq: Once | INTRAVENOUS | Status: AC
  Administered 2021-08-04: 15.3 via INTRAVENOUS

## 2021-08-14 ENCOUNTER — Inpatient Hospital Stay: Payer: 59 | Attending: Hematology and Oncology | Admitting: Hematology and Oncology

## 2021-08-14 DIAGNOSIS — Z5112 Encounter for antineoplastic immunotherapy: Secondary | ICD-10-CM | POA: Insufficient documentation

## 2021-08-14 DIAGNOSIS — C50511 Malignant neoplasm of lower-outer quadrant of right female breast: Secondary | ICD-10-CM | POA: Insufficient documentation

## 2021-08-14 DIAGNOSIS — Z79899 Other long term (current) drug therapy: Secondary | ICD-10-CM | POA: Insufficient documentation

## 2021-08-14 DIAGNOSIS — Z170 Estrogen receptor positive status [ER+]: Secondary | ICD-10-CM | POA: Diagnosis not present

## 2021-08-14 MED ORDER — PROCHLORPERAZINE MALEATE 10 MG PO TABS: 10.0000 mg | ORAL_TABLET | Freq: Four times a day (QID) | ORAL | 1 refills | Status: DC | PRN

## 2021-08-14 MED ORDER — ONDANSETRON HCL 8 MG PO TABS: 8.0000 mg | ORAL_TABLET | Freq: Two times a day (BID) | ORAL | 1 refills | Status: DC | PRN

## 2021-08-14 MED ORDER — LIDOCAINE-PRILOCAINE 2.5-2.5 % EX CREA: TOPICAL_CREAM | CUTANEOUS | 3 refills | Status: DC

## 2021-08-14 NOTE — Progress Notes (Signed)
DISCONTINUE ON PATHWAY REGIMEN - Breast     Cycle 1: A cycle is 7 days:     Trastuzumab-xxxx      Paclitaxel    Cycles 2 through 12: A cycle is every 7 days:     Trastuzumab-xxxx      Paclitaxel    Cycles 13 through 25: A cycle is every 21 days:     Trastuzumab-xxxx   **Always confirm dose/schedule in your pharmacy ordering system**  REASON: Disease Progression PRIOR TREATMENT: [id number]: Weekly Paclitaxel + Trastuzumab x 12 Weeks, Followed by Trastuzumab Maintenance q21 Days x 13 Cycles TREATMENT RESPONSE: Unable to Evaluate  START ON PATHWAY REGIMEN - Breast     A cycle is every 21 days:     Fam-trastuzumab deruxtecan-nxki   **Always confirm dose/schedule in your pharmacy ordering system**  Patient Characteristics: Distant Metastases or Locoregional Recurrent Disease - Unresected or Locally Advanced Unresectable Disease Progressing after Neoadjuvant and Local Therapies, HER2 Positive, ER Positive, Chemotherapy + HER2-Targeted Therapy, Second Line Therapeutic Status: Distant Metastases HER2 Status: Positive (+) ER Status: Positive (+) PR Status: Negative (-) Line of Therapy: Second Line Intent of Therapy: Non-Curative / Palliative Intent, Discussed with Patient

## 2021-08-14 NOTE — Assessment & Plan Note (Addendum)
05/03/2020:Screening mammogram showed a 1.0cm mass at the 6 o'clock position in the right breast. Diagnostic mammogram and US showed the 1.2cm mass at the 6 o'clock position in the right breast. Biopsy showed IDC, grade 3, HER-2 positive (3+), ER 15% weak, PR-, Ki67 80%.  06/02/2020:Right lumpectomy ([facility]): invasive and in situ ductal carcinoma, 1.3cm, clear margins, 1 right axillary lymph node negative for carcinoma. ER 15% weak, PR-,HER-2 positive (3+) Ki67 80%.  Treatment plan: 1.Adjuvant chemotherapy with Taxol Herceptinfollowed byHerceptin maintenance.Stopped after 11 cycles of TaxolHerceptin maintenance completed 07/07/2021 2.Adjuvant radiation therapy9/21/2022-12/19/2020 3.Adjuvant antiestrogen therapywith tamoxifen started October 2022 4.  CT angiogram 07/13/2021: Right supraclavicular lymph node 5 cm, right internal mammary lymph nodes 1.8 cm ]----------------------------------------------------------------------------------------------------- Right supraclavicular lymphadenopathy: Biopsy: Metastatic poorly differentiated adenocarcinoma, ER +40%, PR negative, HER2 positive 3+  Plan: 1.  PET CT scan 08/06/21: Ext Right Supra clav LN along with Internal mammary and Rt Upper Mediastinal and Rt Axillary LN 2. Consider chemotherapy with Enhertu  Patient went to [facility] for a second opinion who agreed with the plan.

## 2021-08-14 NOTE — Progress Notes (Signed)
ON PATHWAY REGIMEN - Breast  No Change  Continue With Treatment as Ordered.  Original Decision Date/Time: 06/09/2020 16:35     Cycle 1: A cycle is 7 days:     Trastuzumab-xxxx      Paclitaxel    Cycles 2 through 12: A cycle is every 7 days:     Trastuzumab-xxxx      Paclitaxel    Cycles 13 through 25: A cycle is every 21 days:     Trastuzumab-xxxx   **Always confirm dose/schedule in your pharmacy ordering system**  Patient Characteristics: Postoperative without Neoadjuvant Therapy (Pathologic Staging), Invasive Disease, Adjuvant Therapy, HER2 Positive, ER Positive, Node Negative, pT1c, pN0/N66m Therapeutic Status: Postoperative without Neoadjuvant Therapy (Pathologic Staging) AJCC Grade: G3 AJCC N Category: pN0 AJCC M Category: cM0 ER Status: Positive (+) AJCC 8 Stage Grouping: IA HER2 Status: Positive (+) Oncotype Dx Recurrence Score: Not Appropriate AJCC T Category: pT1c PR Status: Negative (-) Adjuvant Therapy Status: No Adjuvant Therapy Received Yet or Changing Initial Adjuvant Regimen due to Tolerance Intent of Therapy: Curative Intent, Discussed with Patient

## 2021-08-15 ENCOUNTER — Telehealth: Payer: Self-pay | Admitting: Hematology and Oncology

## 2021-08-15 NOTE — Telephone Encounter (Signed)
Scheduled appointment per 6/12 los. Called the patient but the phone just rang and rang. Eventually a message was given which stated "the call can not be completed as dialed. Please try your phone call later."

## 2021-08-18 MED FILL — Dexamethasone Sodium Phosphate Inj 100 MG/10ML: INTRAMUSCULAR | Qty: 1 | Status: AC

## 2021-08-21 ENCOUNTER — Ambulatory Visit: Payer: 59 | Admitting: Hematology and Oncology

## 2021-08-21 ENCOUNTER — Inpatient Hospital Stay: Payer: 59

## 2021-08-21 ENCOUNTER — Other Ambulatory Visit: Payer: Self-pay

## 2021-08-21 ENCOUNTER — Other Ambulatory Visit: Payer: Self-pay | Admitting: *Deleted

## 2021-08-21 VITALS — BP 132/62 | HR 77 | Temp 97.8°F | Resp 18 | Wt 307.0 lb

## 2021-08-21 DIAGNOSIS — Z5112 Encounter for antineoplastic immunotherapy: Secondary | ICD-10-CM | POA: Diagnosis present

## 2021-08-21 DIAGNOSIS — C50511 Malignant neoplasm of lower-outer quadrant of right female breast: Secondary | ICD-10-CM

## 2021-08-21 DIAGNOSIS — Z95828 Presence of other vascular implants and grafts: Secondary | ICD-10-CM

## 2021-08-21 DIAGNOSIS — D649 Anemia, unspecified: Secondary | ICD-10-CM

## 2021-08-21 DIAGNOSIS — Z79899 Other long term (current) drug therapy: Secondary | ICD-10-CM | POA: Diagnosis not present

## 2021-08-21 DIAGNOSIS — Z170 Estrogen receptor positive status [ER+]: Secondary | ICD-10-CM | POA: Diagnosis not present

## 2021-08-21 LAB — CMP (CANCER CENTER ONLY)
ALT: 13 U/L (ref 0–44)
AST: 17 U/L (ref 15–41)
Albumin: 4 g/dL (ref 3.5–5.0)
Alkaline Phosphatase: 58 U/L (ref 38–126)
Anion gap: 7 (ref 5–15)
BUN: 14 mg/dL (ref 6–20)
CO2: 30 mmol/L (ref 22–32)
Calcium: 9.3 mg/dL (ref 8.9–10.3)
Chloride: 103 mmol/L (ref 98–111)
Creatinine: 0.8 mg/dL (ref 0.44–1.00)
GFR, Estimated: >60 mL/min (ref >60)
Glucose, Bld: 108 mg/dL — ABNORMAL HIGH (ref 70–99)
Potassium: 3.3 mmol/L — ABNORMAL LOW (ref 3.5–5.1)
Sodium: 140 mmol/L (ref 135–145)
Total Bilirubin: 0.8 mg/dL (ref 0.3–1.2)
Total Protein: 6.9 g/dL (ref 6.5–8.1)

## 2021-08-21 LAB — CBC WITH DIFFERENTIAL (CANCER CENTER ONLY)
Abs Immature Granulocytes: 0.02 10*3/uL (ref 0.00–0.07)
Basophils Absolute: 0 10*3/uL (ref 0.0–0.1)
Basophils Relative: 1 %
Eosinophils Absolute: 0.2 10*3/uL (ref 0.0–0.5)
Eosinophils Relative: 3 %
HCT: 34 % — ABNORMAL LOW (ref 36.0–46.0)
Hemoglobin: 11.1 g/dL — ABNORMAL LOW (ref 12.0–15.0)
Immature Granulocytes: 0 %
Lymphocytes Relative: 26 %
Lymphs Abs: 1.2 10*3/uL (ref 0.7–4.0)
MCH: 26.2 pg (ref 26.0–34.0)
MCHC: 32.6 g/dL (ref 30.0–36.0)
MCV: 80.4 fL (ref 80.0–100.0)
Monocytes Absolute: 0.5 10*3/uL (ref 0.1–1.0)
Monocytes Relative: 10 %
Neutro Abs: 2.8 10*3/uL (ref 1.7–7.7)
Neutrophils Relative %: 60 %
Platelet Count: 202 10*3/uL (ref 150–400)
RBC: 4.23 MIL/uL (ref 3.87–5.11)
RDW: 14.3 % (ref 11.5–15.5)
WBC Count: 4.7 10*3/uL (ref 4.0–10.5)
nRBC: 0 % (ref 0.0–0.2)

## 2021-08-21 LAB — IRON AND IRON BINDING CAPACITY (CC-WL,HP ONLY)
Iron: 70 ug/dL (ref 28–170)
Saturation Ratios: 20 % (ref 10.4–31.8)
TIBC: 346 ug/dL (ref 250–450)
UIBC: 276 ug/dL (ref 148–442)

## 2021-08-21 LAB — VITAMIN B12: Vitamin B-12: 949 pg/mL — ABNORMAL HIGH (ref 180–914)

## 2021-08-21 LAB — FERRITIN: Ferritin: 33 ng/mL (ref 11–307)

## 2021-08-21 MED ORDER — SODIUM CHLORIDE 0.9% FLUSH
10.0000 mL | Freq: Once | INTRAVENOUS | Status: AC
  Administered 2021-08-21: 10 mL

## 2021-08-21 MED ORDER — FAM-TRASTUZUMAB DERUXTECAN-NXKI CHEMO 100 MG IV SOLR
700.0000 mg | Freq: Once | INTRAVENOUS | Status: AC
  Administered 2021-08-21: 700 mg via INTRAVENOUS
  Filled 2021-08-21: qty 35

## 2021-08-21 MED ORDER — ACETAMINOPHEN 325 MG PO TABS
650.0000 mg | ORAL_TABLET | Freq: Once | ORAL | Status: AC
  Administered 2021-08-21: 650 mg via ORAL
  Filled 2021-08-21: qty 2

## 2021-08-21 MED ORDER — DIPHENHYDRAMINE HCL 25 MG PO CAPS
50.0000 mg | ORAL_CAPSULE | Freq: Once | ORAL | Status: AC
  Administered 2021-08-21: 50 mg via ORAL
  Filled 2021-08-21: qty 2

## 2021-08-21 MED ORDER — HEPARIN SOD (PORK) LOCK FLUSH 100 UNIT/ML IV SOLN
500.0000 [IU] | Freq: Once | INTRAVENOUS | Status: AC | PRN
  Administered 2021-08-21: 500 [IU]

## 2021-08-21 MED ORDER — DEXTROSE 5 % IV SOLN: Freq: Once | INTRAVENOUS | Status: AC

## 2021-08-21 MED ORDER — SODIUM CHLORIDE 0.9% FLUSH
10.0000 mL | INTRAVENOUS | Status: DC | PRN
  Administered 2021-08-21: 10 mL

## 2021-08-21 MED ORDER — SODIUM CHLORIDE 0.9 % IV SOLN
10.0000 mg | Freq: Once | INTRAVENOUS | Status: AC
  Administered 2021-08-21: 10 mg via INTRAVENOUS
  Filled 2021-08-21: qty 10

## 2021-08-21 MED ORDER — PALONOSETRON HCL INJECTION 0.25 MG/5ML
0.2500 mg | Freq: Once | INTRAVENOUS | Status: AC
  Administered 2021-08-21: 0.25 mg via INTRAVENOUS
  Filled 2021-08-21: qty 5

## 2021-08-21 NOTE — Patient Instructions (Signed)
[facility]  Discharge Instructions: Thank you for choosing [facility] to provide your oncology and hematology care.   If you have a lab appointment with the [facility], please go directly to the [facility] and check in at the registration area.   Wear comfortable clothing and clothing appropriate for easy access to any Portacath or PICC line.   We strive to give you quality time with your provider. You may need to reschedule your appointment if you arrive late (15 or more minutes).  Arriving late affects you and other patients whose appointments are after yours.  Also, if you miss three or more appointments without notifying the office, you may be dismissed from the clinic at the provider's discretion.      For prescription refill requests, have your pharmacy contact our office and allow 72 hours for refills to be completed.    Today you received the following chemotherapy and/or immunotherapy agents Enhertu      To help prevent nausea and vomiting after your treatment, we encourage you to take your nausea medication as directed.  BELOW ARE SYMPTOMS THAT SHOULD BE REPORTED IMMEDIATELY: *FEVER GREATER THAN 100.4 F (38 C) OR HIGHER *CHILLS OR SWEATING *NAUSEA AND VOMITING THAT IS NOT CONTROLLED WITH YOUR NAUSEA MEDICATION *UNUSUAL SHORTNESS OF BREATH *UNUSUAL BRUISING OR BLEEDING *URINARY PROBLEMS (pain or burning when urinating, or frequent urination) *BOWEL PROBLEMS (unusual diarrhea, constipation, pain near the anus) TENDERNESS IN MOUTH AND THROAT WITH OR WITHOUT PRESENCE OF ULCERS (sore throat, sores in mouth, or a toothache) UNUSUAL RASH, SWELLING OR PAIN  UNUSUAL VAGINAL DISCHARGE OR ITCHING   Items with * indicate a potential emergency and should be followed up as soon as possible or go to the Emergency Department if any problems should occur.  Please show the CHEMOTHERAPY ALERT CARD or IMMUNOTHERAPY ALERT CARD at check-in to the  Emergency Department and triage nurse.  Should you have questions after your visit or need to cancel or reschedule your appointment, please contact [facility]  Dept: [phone]  and follow the prompts.  Office hours are 8:00 a.m. to 4:30 p.m. Monday - Friday. Please note that voicemails left after 4:00 p.m. may not be returned until the following business day.  We are closed weekends and major holidays. You have access to a nurse at all times for urgent questions. Please call the main number to the clinic Dept: [phone] and follow the prompts.   For any non-urgent questions, you may also contact your provider using MyChart. We now offer e-Visits for anyone 37 and older to request care online for non-urgent symptoms. For details visit [email or web address].   Also download the MyChart app! Go to the app store, search "MyChart", open the app, select [facility], and log in with your MyChart username and password.  Masks are optional in the cancer centers. If you would like for your care team to wear a mask while they are taking care of you, please let them know. For doctor visits, patients may have with them one support person who is at least 55 years old. At this time, visitors are not allowed in the infusion area.  Fam-Trastuzumab Deruxtecan Injection (Enhertu) What is this medication? FAM-TRASTUZUMAB DERUXTECAN (fam-tras TOOZ eu mab DER ux TEE kan) treats some types of cancer. It works by blocking a protein that causes cancer cells to grow and multiply. This helps to slow or stop the spread of cancer cells. This medicine  may be used for other purposes; ask your health care provider or pharmacist if you have questions. COMMON BRAND NAME(S): ENHERTU What should I tell my care team before I take this medication? They need to know if you have any of these conditions: Heart disease Heart failure Infection, especially a viral infection, such as chickenpox, cold  sores, or herpes Liver disease Lung or breathing disease, such as asthma or COPD An unusual or allergic reaction to fam-trastuzumab deruxtecan, other medications, foods, dyes, or preservatives Pregnant or trying to get pregnant Breast-feeding How should I use this medication? This medication is injected into a vein. It is given by your care team in a hospital or clinic setting. A special MedGuide will be given to you before each treatment. Be sure to read this information carefully each time. Talk to your care team about the use of this medication in children. Special care may be needed. Overdosage: If you think you have taken too much of this medicine contact a poison control center or emergency room at once. NOTE: This medicine is only for you. Do not share this medicine with others. What if I miss a dose? It is important not to miss your dose. Call your care team if you are unable to keep an appointment. What may interact with this medication? Interactions are not expected. This list may not describe all possible interactions. Give your health care provider a list of all the medicines, herbs, non-prescription drugs, or dietary supplements you use. Also tell them if you smoke, drink alcohol, or use illegal drugs. Some items may interact with your medicine. What should I watch for while using this medication? Visit your care team for regular checks on your progress. Tell your care team if your symptoms do not start to get better or if they get worse. Your condition will be monitored carefully while you are receiving this medication. Do not become pregnant while taking this medication or for 7 months after stopping it. Women should inform their care team if they wish to become pregnant or think they might be pregnant. Men should not father a child while taking this medication and for 4 months after stopping it. There is potential for serious side effects to an unborn child. Talk to your care team  for more information. Do not breast-feed an infant while taking this medication or for 7 months after the last dose. This medication has caused decreased sperm counts in some men. This may make it more difficult to father a child. Talk to your care team if you are concerned about your fertility. This medication may increase your risk to bruise or bleed. Call your care team if you notice any unusual bleeding. Be careful brushing or flossing your teeth or using a toothpick because you may get an infection or bleed more easily. If you have any dental work done, tell your dentist you are receiving this medication. This medication may cause dry eyes and blurred vision. If you wear contact lenses, you may feel some discomfort. Lubricating eye drops may help. See your care team if the problem does not go away or is severe. This medication may increase your risk of getting an infection. Call your care team for advice if you get a fever, chills, sore throat, or other symptoms of a cold or flu. Do not treat yourself. Try to avoid being around people who are sick. Avoid taking medications that contain aspirin, acetaminophen, ibuprofen, naproxen, or ketoprofen unless instructed by your care team. These  medications may hide a fever. What side effects may I notice from receiving this medication? Side effects that you should report to your care team as soon as possible: Allergic reactions--skin rash, itching, hives, swelling of the face, lips, tongue, or throat Dry cough, shortness of breath or trouble breathing Infection--fever, chills, cough, sore throat, wounds that don't heal, pain or trouble when passing urine, general feeling of discomfort or being unwell Heart failure--shortness of breath, swelling of the ankles, feet, or hands, sudden weight gain, unusual weakness or fatigue Unusual bruising or bleeding Side effects that usually do not require medical attention (report these to your care team if they  continue or are bothersome): Constipation Diarrhea Hair loss Muscle pain Nausea Vomiting This list may not describe all possible side effects. Call your doctor for medical advice about side effects. You may report side effects to FDA at 1-800-FDA-1088. Where should I keep my medication? This medication is given in a hospital or clinic. It will not be stored at home. NOTE: This sheet is a summary. It may not cover all possible information. If you have questions about this medicine, talk to your doctor, pharmacist, or health care provider.  2023 Elsevier/Gold Standard (2020-12-06 00:00:00)

## 2021-08-22 ENCOUNTER — Telehealth: Payer: Self-pay

## 2021-08-22 NOTE — Telephone Encounter (Signed)
Pamela [name] states that she is doing fine. She felt a little nausea coming on last evening and used the compazine with good effect. She is eating, drinking, and urinating well. She knows to call the office at [phone] if she has any questions or concerns.

## 2021-08-22 NOTE — Telephone Encounter (Signed)
-----   Message from Ishmael Holter, RN sent at 08/21/2021  3:25 PM EDT ----- Regarding: Dr. Lindi Adie 1st tx f/u call Dr. Lindi Adie 1st tx f/u call. Enhertu - tolerated well

## 2021-08-24 ENCOUNTER — Other Ambulatory Visit: Payer: Self-pay | Admitting: Nurse Practitioner

## 2021-08-25 ENCOUNTER — Other Ambulatory Visit: Payer: Self-pay | Admitting: *Deleted

## 2021-08-25 ENCOUNTER — Encounter: Payer: Self-pay | Admitting: Hematology and Oncology

## 2021-08-25 ENCOUNTER — Inpatient Hospital Stay: Payer: 59

## 2021-08-25 ENCOUNTER — Other Ambulatory Visit: Payer: Self-pay

## 2021-08-25 VITALS — BP 124/67 | HR 65 | Resp 18

## 2021-08-25 DIAGNOSIS — R110 Nausea: Secondary | ICD-10-CM

## 2021-08-25 DIAGNOSIS — Z95828 Presence of other vascular implants and grafts: Secondary | ICD-10-CM

## 2021-08-25 DIAGNOSIS — C50919 Malignant neoplasm of unspecified site of unspecified female breast: Secondary | ICD-10-CM | POA: Insufficient documentation

## 2021-08-25 DIAGNOSIS — C50511 Malignant neoplasm of lower-outer quadrant of right female breast: Secondary | ICD-10-CM

## 2021-08-25 DIAGNOSIS — Z5112 Encounter for antineoplastic immunotherapy: Secondary | ICD-10-CM | POA: Diagnosis not present

## 2021-08-25 MED ORDER — SODIUM CHLORIDE 0.9 % IV SOLN: INTRAVENOUS | Status: AC

## 2021-08-25 MED ORDER — HEPARIN SOD (PORK) LOCK FLUSH 100 UNIT/ML IV SOLN
500.0000 [IU] | Freq: Once | INTRAVENOUS | Status: AC
  Administered 2021-08-25: 500 [IU]

## 2021-08-25 MED ORDER — SODIUM CHLORIDE 0.9 % IV SOLN
10.0000 mg | Freq: Once | INTRAVENOUS | Status: AC
  Administered 2021-08-25: 10 mg via INTRAVENOUS
  Filled 2021-08-25: qty 10

## 2021-08-25 MED ORDER — DEXAMETHASONE 2 MG PO TABS: 2.0000 mg | ORAL_TABLET | Freq: Every day | ORAL | 0 refills | Status: DC

## 2021-08-25 MED ORDER — SODIUM CHLORIDE 0.9% FLUSH
10.0000 mL | Freq: Once | INTRAVENOUS | Status: AC
  Administered 2021-08-25: 10 mL

## 2021-08-25 MED ORDER — CYANOCOBALAMIN 1000 MCG/ML IJ SOLN: 1000.0000 ug | Freq: Once | INTRAMUSCULAR | Status: DC

## 2021-08-25 MED ORDER — LORAZEPAM 0.5 MG PO TABS: 0.5000 mg | ORAL_TABLET | Freq: Three times a day (TID) | ORAL | 0 refills | Status: DC | PRN

## 2021-08-26 ENCOUNTER — Other Ambulatory Visit: Payer: Self-pay

## 2021-08-26 ENCOUNTER — Inpatient Hospital Stay: Payer: 59

## 2021-08-26 VITALS — BP 129/60 | HR 61 | Temp 97.9°F | Resp 18

## 2021-08-26 DIAGNOSIS — Z95828 Presence of other vascular implants and grafts: Secondary | ICD-10-CM

## 2021-08-26 DIAGNOSIS — Z170 Estrogen receptor positive status [ER+]: Secondary | ICD-10-CM

## 2021-08-26 DIAGNOSIS — Z5112 Encounter for antineoplastic immunotherapy: Secondary | ICD-10-CM | POA: Diagnosis not present

## 2021-08-26 DIAGNOSIS — R110 Nausea: Secondary | ICD-10-CM

## 2021-08-26 DIAGNOSIS — C50919 Malignant neoplasm of unspecified site of unspecified female breast: Secondary | ICD-10-CM

## 2021-08-26 MED ORDER — SODIUM CHLORIDE 0.9 % IV SOLN: INTRAVENOUS | Status: AC

## 2021-08-26 MED ORDER — HEPARIN SOD (PORK) LOCK FLUSH 100 UNIT/ML IV SOLN: 500.0000 [IU] | Freq: Once | INTRAVENOUS | Status: DC

## 2021-08-26 MED ORDER — SODIUM CHLORIDE 0.9% FLUSH: 10.0000 mL | Freq: Once | INTRAVENOUS | Status: DC

## 2021-08-30 ENCOUNTER — Telehealth: Payer: Self-pay | Admitting: *Deleted

## 2021-08-30 NOTE — Telephone Encounter (Signed)
Received call from pt with complaint of diarrhea x 2 days.  Pt states she is experiencing several episodes a day.  Pt denise symptoms of dehydration including lightheadedness/ dizziness.  Pt requesting advice from MD if okay to take OTC Imodium.  Per MD okay for pt to proceed with imodium as directed on the box today.  Pt educated to increase fluid intake to prevent dehydration and to contact the office tomorrow if symptoms do not resolve with imodium.  Pt verbalized understanding.

## 2021-08-31 ENCOUNTER — Telehealth: Payer: Self-pay | Admitting: *Deleted

## 2021-08-31 ENCOUNTER — Other Ambulatory Visit: Payer: Self-pay | Admitting: *Deleted

## 2021-08-31 DIAGNOSIS — C50919 Malignant neoplasm of unspecified site of unspecified female breast: Secondary | ICD-10-CM

## 2021-08-31 NOTE — Telephone Encounter (Signed)
Received call from pt with complaint of fatigue and weakness x 2 days.  Pt states OTC imodium has helped alleviate diarrhea but continues to experience nausea. Per MD pt needing to be scheduled for labs and IVF.  Appt scheduled and orders placed under sign and held.  Pt verbalized understanding of appt date and time.

## 2021-09-01 ENCOUNTER — Inpatient Hospital Stay: Payer: 59

## 2021-09-01 ENCOUNTER — Inpatient Hospital Stay (HOSPITAL_BASED_OUTPATIENT_CLINIC_OR_DEPARTMENT_OTHER): Payer: 59 | Admitting: Adult Health

## 2021-09-01 ENCOUNTER — Other Ambulatory Visit: Payer: Self-pay

## 2021-09-01 VITALS — BP 98/59 | HR 71 | Temp 98.6°F | Resp 18

## 2021-09-01 DIAGNOSIS — Z170 Estrogen receptor positive status [ER+]: Secondary | ICD-10-CM

## 2021-09-01 DIAGNOSIS — C50511 Malignant neoplasm of lower-outer quadrant of right female breast: Secondary | ICD-10-CM | POA: Diagnosis not present

## 2021-09-01 DIAGNOSIS — Z5112 Encounter for antineoplastic immunotherapy: Secondary | ICD-10-CM | POA: Diagnosis not present

## 2021-09-01 DIAGNOSIS — R110 Nausea: Secondary | ICD-10-CM

## 2021-09-01 DIAGNOSIS — Z95828 Presence of other vascular implants and grafts: Secondary | ICD-10-CM

## 2021-09-01 DIAGNOSIS — C50919 Malignant neoplasm of unspecified site of unspecified female breast: Secondary | ICD-10-CM

## 2021-09-01 LAB — MAGNESIUM: Magnesium: 1.6 mg/dL — ABNORMAL LOW (ref 1.7–2.4)

## 2021-09-01 LAB — CBC WITH DIFFERENTIAL (CANCER CENTER ONLY)
Abs Immature Granulocytes: 0.01 10*3/uL (ref 0.00–0.07)
Basophils Absolute: 0 10*3/uL (ref 0.0–0.1)
Basophils Relative: 1 %
Eosinophils Absolute: 0.3 10*3/uL (ref 0.0–0.5)
Eosinophils Relative: 8 %
HCT: 34 % — ABNORMAL LOW (ref 36.0–46.0)
Hemoglobin: 11.4 g/dL — ABNORMAL LOW (ref 12.0–15.0)
Immature Granulocytes: 0 %
Lymphocytes Relative: 36 %
Lymphs Abs: 1.1 10*3/uL (ref 0.7–4.0)
MCH: 26.4 pg (ref 26.0–34.0)
MCHC: 33.5 g/dL (ref 30.0–36.0)
MCV: 78.7 fL — ABNORMAL LOW (ref 80.0–100.0)
Monocytes Absolute: 0.2 10*3/uL (ref 0.1–1.0)
Monocytes Relative: 6 %
Neutro Abs: 1.5 10*3/uL — ABNORMAL LOW (ref 1.7–7.7)
Neutrophils Relative %: 49 %
Platelet Count: 144 10*3/uL — ABNORMAL LOW (ref 150–400)
RBC: 4.32 MIL/uL (ref 3.87–5.11)
RDW: 13.2 % (ref 11.5–15.5)
WBC Count: 3 10*3/uL — ABNORMAL LOW (ref 4.0–10.5)
nRBC: 0 % (ref 0.0–0.2)

## 2021-09-01 LAB — CMP (CANCER CENTER ONLY)
ALT: 26 U/L (ref 0–44)
AST: 24 U/L (ref 15–41)
Albumin: 3.6 g/dL (ref 3.5–5.0)
Alkaline Phosphatase: 62 U/L (ref 38–126)
Anion gap: 9 (ref 5–15)
BUN: 13 mg/dL (ref 6–20)
CO2: 28 mmol/L (ref 22–32)
Calcium: 9.3 mg/dL (ref 8.9–10.3)
Chloride: 101 mmol/L (ref 98–111)
Creatinine: 0.97 mg/dL (ref 0.44–1.00)
GFR, Estimated: >60 mL/min (ref >60)
Glucose, Bld: 132 mg/dL — ABNORMAL HIGH (ref 70–99)
Potassium: 3.3 mmol/L — ABNORMAL LOW (ref 3.5–5.1)
Sodium: 138 mmol/L (ref 135–145)
Total Bilirubin: 1 mg/dL (ref 0.3–1.2)
Total Protein: 6.7 g/dL (ref 6.5–8.1)

## 2021-09-01 LAB — SAMPLE TO BLOOD BANK

## 2021-09-01 MED ORDER — HEPARIN SOD (PORK) LOCK FLUSH 100 UNIT/ML IV SOLN
500.0000 [IU] | Freq: Once | INTRAVENOUS | Status: AC
  Administered 2021-09-01: 500 [IU]

## 2021-09-01 MED ORDER — OMEPRAZOLE 40 MG PO CPDR: 40.0000 mg | DELAYED_RELEASE_CAPSULE | Freq: Every day | ORAL | 1 refills | Status: DC

## 2021-09-01 MED ORDER — SODIUM CHLORIDE 0.9% FLUSH
10.0000 mL | Freq: Once | INTRAVENOUS | Status: AC
  Administered 2021-09-01: 10 mL

## 2021-09-01 MED ORDER — SODIUM CHLORIDE 0.9 % IV SOLN: INTRAVENOUS | Status: AC

## 2021-09-01 MED ORDER — SODIUM CHLORIDE 0.9 % IV SOLN
10.0000 mg | Freq: Once | INTRAVENOUS | Status: AC
  Administered 2021-09-01: 10 mg via INTRAVENOUS
  Filled 2021-09-01: qty 10

## 2021-09-01 NOTE — Patient Instructions (Signed)
Rehydration, Adult Rehydration is the replacement of body fluids, salts, and minerals (electrolytes) that are lost during dehydration. Dehydration is when there is not enough water or other fluids in the body. This happens when you lose more fluids than you take in. Common causes of dehydration include: Not drinking enough fluids. This can occur when you are ill or doing activities that require a lot of energy, especially in hot weather. Conditions that cause loss of water or other fluids, such as diarrhea, vomiting, sweating, or urinating a lot. Other illnesses, such as fever or infection. Certain medicines, such as those that remove excess fluid from the body (diuretics). Symptoms of mild or moderate dehydration may include thirst, dry lips and mouth, and dizziness. Symptoms of severe dehydration may include increased heart rate, confusion, fainting, and not urinating. For severe dehydration, you may need to get fluids through an IV at the hospital. For mild or moderate dehydration, you can usually rehydrate at home by drinking certain fluids as told by your health care provider. What are the risks? Generally, rehydration is safe. However, taking in too much fluid (overhydration) can be a problem. This is rare. Overhydration can cause an electrolyte imbalance, kidney failure, or a decrease in salt (sodium) levels in the body. Supplies needed You will need an oral rehydration solution (ORS) if your health care provider tells you to use one. This is a drink to treat dehydration. It can be found in pharmacies and retail stores. How to rehydrate Fluids Follow instructions from your health care provider for rehydration. The kind of fluid and the amount you should drink depend on your condition. In general, you should choose drinks that you prefer. If told by your health care provider, drink an ORS. Make an ORS by following instructions on the package. Start by drinking small amounts, about  cup (120  mL) every 5-10 minutes. Slowly increase how much you drink until you have taken the amount recommended by your health care provider. Drink enough clear fluids to keep your urine pale yellow. If you were told to drink an ORS, finish it first, then start slowly drinking other clear fluids. Drink fluids such as: Water. This includes sparkling water and flavored water. Drinking only water can lead to having too little sodium in your body (hyponatremia). Follow the advice of your health care provider. Water from ice chips you suck on. Fruit juice with water you add to it (diluted). Sports drinks. Hot or cold herbal teas. Broth-based soups. Milk or milk products. Food Follow instructions from your health care provider about what to eat while you rehydrate. Your health care provider may recommend that you slowly begin eating regular foods in small amounts. Eat foods that contain a healthy balance of electrolytes, such as bananas, oranges, potatoes, tomatoes, and spinach. Avoid foods that are greasy or contain a lot of sugar. In some cases, you may get nutrition through a feeding tube that is passed through your nose and into your stomach (nasogastric tube, or NG tube). This may be done if you have uncontrolled vomiting or diarrhea. Beverages to avoid  Certain beverages may make dehydration worse. While you rehydrate, avoid drinking alcohol. How to tell if you are recovering from dehydration You may be recovering from dehydration if: You are urinating more often than before you started rehydrating. Your urine is pale yellow. Your energy level improves. You vomit less frequently. You have diarrhea less frequently. Your appetite improves or returns to normal. You feel less dizzy or less light-headed.   Your skin tone and color start to look more normal. Follow these instructions at home: Take over-the-counter and prescription medicines only as told by your health care provider. Do not take sodium  tablets. Doing this can lead to having too much sodium in your body (hypernatremia). Contact a health care provider if: You continue to have symptoms of mild or moderate dehydration, such as: Thirst. Dry lips. Slightly dry mouth. Dizziness. Dark urine or less urine than normal. Muscle cramps. You continue to vomit or have diarrhea. Get help right away if you: Have symptoms of dehydration that get worse. Have a fever. Have a severe headache. Have been vomiting and the following happens: Your vomiting gets worse or does not go away. Your vomit includes blood or green matter (bile). You cannot eat or drink without vomiting. Have problems with urination or bowel movements, such as: Diarrhea that gets worse or does not go away. Blood in your stool (feces). This may cause stool to look black and tarry. Not urinating, or urinating only a small amount of very dark urine, within 6-8 hours. Have trouble breathing. Have symptoms that get worse with treatment. These symptoms may represent a serious problem that is an emergency. Do not wait to see if the symptoms will go away. Get medical help right away. Call your local emergency services (911 in the [place] Do not drive yourself to the hospital. Summary Rehydration is the replacement of body fluids and minerals (electrolytes) that are lost during dehydration. Follow instructions from your health care provider for rehydration. The kind of fluid and amount you should drink depend on your condition. Slowly increase how much you drink until you have taken the amount recommended by your health care provider. Contact your health care provider if you continue to show signs of mild or moderate dehydration. This information is not intended to replace advice given to you by your health care provider. Make sure you discuss any questions you have with your health care provider. Document Revised: 04/22/2019 Document Reviewed: 03/02/2019 Elsevier Patient  Education  2023 [employer]  

## 2021-09-01 NOTE — Progress Notes (Signed)
Patient Care Team: [name], MD as PCP - General (Internal Medicine) Mcarthur Rossetti, MD as Consulting Physician (Orthopedic Surgery) Mauro Kaufmann, RN as Oncology Nurse Navigator Rockwell Germany, RN as Oncology Nurse Navigator [name], MD as Consulting Physician (General Surgery) Nicholas Lose, MD as Consulting Physician (Hematology and Oncology) Eppie Gibson, MD as Attending Physician (Radiation Oncology)  DIAGNOSIS:  Encounter Diagnosis  Name Primary?   Malignant neoplasm of lower-outer quadrant of right breast of female, estrogen receptor positive ([place])     SUMMARY OF ONCOLOGIC HISTORY: Oncology History  Malignant neoplasm of lower-outer quadrant of right breast of female, estrogen receptor positive ([place])  05/03/2020 Initial Diagnosis   Screening mammogram showed a 1.0cm mass at the 6 o'clock position in the right breast. Diagnostic mammogram and US showed the 1.2cm mass at the 6 o'clock position in the right breast. Biopsy showed IDC, grade 3, HER-2 positive (3+), ER 15% weak, PR-, Ki67 80%.   05/11/2020 Cancer Staging   Staging form: Breast, AJCC 8th Edition - Clinical stage from 05/11/2020: Stage IA (cT1b, cN0, cM0, G3, ER+, PR-, HER2+) - Signed by Nicholas Lose, MD on 05/11/2020 Stage prefix: Initial diagnosis   05/19/2020 Genetic Testing   Negative hereditary cancer genetic testing: no pathogenic variants detected in [facility] Breast STAT Panel and Common Hereditary Cancers Panel.  The report dates are May 19, 2020 (STAT) and May 23, 2020 (Common Hereditary).   The Common Hereditary Cancers Panel offered by [facility] includes sequencing and/or deletion duplication testing of the following 47 genes: APC, ATM, AXIN2, BARD1, BMPR1A, BRCA1, BRCA2, BRIP1, CDH1, CDK4, CDKN2A (p14ARF), CDKN2A (p16INK4a), CHEK2, CTNNA1, DICER1, EPCAM (Deletion/duplication testing only), GREM1 (promoter region deletion/duplication testing only), GREM1, HOXB13, KIT, MEN1, MLH1, MSH2,  MSH3, MSH6, MUTYH, NBN, NF1, NHTL1, PALB2, PDGFRA, PMS2, POLD1, POLE, PTEN, RAD50, RAD51C, RAD51D, SDHA, SDHB, SDHC, SDHD, SMAD4, SMARCA4. STK11, TP53, TSC1, TSC2, and VHL.  The following genes were evaluated for sequence changes only: SDHA and HOXB13 c.251G>A variant only.es only: SDHA and HOXB13 c.251G>A variant only.   06/02/2020 Surgery   Right lumpectomy ([facility]): invasive and in situ ductal carcinoma, 1.3cm, clear margins, 1 right axillary lymph node negative for carcinoma.   06/02/2020 Cancer Staging   Staging form: Breast, AJCC 8th Edition - Pathologic stage from 06/02/2020: Stage IA (pT1c, pN0, cM0, G3, ER+, PR-, HER2+) - Signed by Gardenia Phlegm, NP on 06/15/2020 Stage prefix: Initial diagnosis Histologic grading system: 3 grade system   07/08/2020 - 10/21/2020 Chemotherapy   Taxol Herceptin followed by Herceptin maintenance   11/11/2020 -  Chemotherapy   Herceptin maintenance       04/14/2021 -  Anti-estrogen oral therapy   Tamoxifen 10 mg   08/21/2021 -  Chemotherapy   Patient is on Treatment Plan : BREAST METASTATIC fam-trastuzumab deruxtecan-nxki (Enhertu) q21d       CHIEF COMPLIANT: Cycle 2 Enhertu  INTERVAL HISTORY: Pamela [name] is a 56 y.o. with above-mentioned history of metastatic breast cancer. She reports to the clinic today for cycle 2 of Enhertu.  After cycle 1 she had profound nausea that lasted for 2 weeks.  She also had diarrhea which was quite severe.  She had to come in for IV fluids which seem to have helped.  Over the past week she has felt markedly better.  She had severe fatigue.  She was unable to work because of the severity of the nausea and the diarrhea.  She also noticed some spotting per vagina.   ALLERGIES:  is allergic to pollen extract, latex, codeine, shellfish allergy, and betadine [povidone iodine].  MEDICATIONS:  Current Outpatient Medications  Medication Sig Dispense Refill   dexamethasone (DECADRON) 4 MG tablet Take 1 tablet  (4 mg total) by mouth 2 (two) times daily with a meal. Day 2,3,4 one tablet with lunch 30 tablet 1   amLODipine (NORVASC) 10 MG tablet Take 1 tablet (10 mg total) by mouth daily. 90 tablet 2   aspirin EC 81 MG tablet Take 1 tablet (81 mg total) by mouth daily. Swallow whole. 30 tablet 11   Azilsartan-Chlorthalidone (EDARBYCLOR) 40-25 MG TABS Take 1 tablet by mouth daily. 90 tablet 2   diphenoxylate-atropine (LOMOTIL) 2.5-0.025 MG tablet Take 1 tablet by mouth 4 (four) times daily as needed for diarrhea or loose stools. 30 tablet 0   hydrOXYzine (ATARAX) 25 MG tablet Take 1 tablet by mouth every 12 (twelve) hours as needed.     levocetirizine (XYZAL) 5 MG tablet Take 1 tablet (5 mg total) by mouth every evening. 90 tablet 0   levonorgestrel (MIRENA, 52 MG,) 20 MCG/DAY IUD 1 each by Intrauterine route once.     lidocaine-prilocaine (EMLA) cream Apply 1 application topically daily as needed. 60 g 0   LORazepam (ATIVAN) 0.5 MG tablet Take 1 tablet (0.5 mg total) by mouth every 8 (eight) hours as needed for anxiety. 30 tablet 0   montelukast (SINGULAIR) 10 MG tablet Take 1 tablet (10 mg total) by mouth daily. 90 tablet 0   omeprazole (PRILOSEC) 40 MG capsule Take 1 capsule (40 mg total) by mouth daily. 30 capsule 1   ondansetron (ZOFRAN) 8 MG tablet Take 1 tablet (8 mg total) by mouth 2 (two) times daily as needed for refractory nausea / vomiting. Start on day 3 after chemo. 30 tablet 1   OVER THE COUNTER MEDICATION Take 1 capsule by mouth daily. Medi weight loss vita super     prochlorperazine (COMPAZINE) 10 MG tablet Take 1 tablet (10 mg total) by mouth every 6 (six) hours as needed (Nausea or vomiting). 30 tablet 1   rosuvastatin (CRESTOR) 40 MG tablet Take 1 tablet (40 mg total) by mouth daily. 90 tablet 3   VITAMIN D, CHOLECALCIFEROL, PO Take 1 tablet by mouth daily.     Current Facility-Administered Medications  Medication Dose Route Frequency Provider Last Rate Last Admin   0.9 %  sodium  chloride infusion   Intravenous PRN Ghumman, Ramandeep, NP       Facility-Administered Medications Ordered in Other Visits  Medication Dose Route Frequency Provider Last Rate Last Admin   heparin lock flush 100 unit/mL  500 Units Intracatheter Once Nicholas Lose, MD       sodium chloride flush (NS) 0.9 % injection 10 mL  10 mL Intracatheter Once Nicholas Lose, MD        PHYSICAL EXAMINATION: ECOG PERFORMANCE STATUS: 1 - Symptomatic but completely ambulatory  Vitals:   09/15/21 1008  BP: 140/70  Pulse: 66  Resp: 16  Temp: (!) 97.2 F (36.2 C)  SpO2: 100%   Filed Weights   09/15/21 1008  Weight: (!) 304 lb 14.4 oz (138.3 kg)      LABORATORY DATA:  I have reviewed the data as listed    Latest Ref Rng & Units 09/01/2021   10:35 AM 08/21/2021    8:53 AM 07/21/2021   11:43 AM  CMP  Glucose 70 - 99 mg/dL 132  108  112   BUN 6 - 20 mg/dL 13  14  14   Creatinine 0.44 - 1.00 mg/dL 0.97  0.80  0.92   Sodium 135 - 145 mmol/L 138  140  141   Potassium 3.5 - 5.1 mmol/L 3.3  3.3  3.6   Chloride 98 - 111 mmol/L 101  103  104   CO2 22 - 32 mmol/L 28  30  29    Calcium 8.9 - 10.3 mg/dL 9.3  9.3  9.6   Total Protein 6.5 - 8.1 g/dL 6.7  6.9    Total Bilirubin 0.3 - 1.2 mg/dL 1.0  0.8    Alkaline Phos 38 - 126 U/L 62  58    AST 15 - 41 U/L 24  17    ALT 0 - 44 U/L 26  13      Lab Results  Component Value Date   WBC 5.7 09/15/2021   HGB 10.4 (L) 09/15/2021   HCT 32.0 (L) 09/15/2021   MCV 81.8 09/15/2021   PLT 225 09/15/2021   NEUTROABS 3.6 09/15/2021    ASSESSMENT & PLAN:  Malignant neoplasm of lower-outer quadrant of right breast of female, estrogen receptor positive ([place]) 05/03/2020:Screening mammogram showed a 1.0cm mass at the 6 o'clock position in the right breast. Diagnostic mammogram and US showed the 1.2cm mass at the 6 o'clock position in the right breast. Biopsy showed IDC, grade 3, HER-2 positive (3+), ER 15% weak, PR-, Ki67 80%.   06/02/2020:Right lumpectomy ([facility]):  invasive and in situ ductal carcinoma, 1.3cm, clear margins, 1 right axillary lymph node negative for carcinoma. ER 15% weak, PR-,HER-2 positive (3+) Ki67 80%.   Treatment plan: 1.  Adjuvant chemotherapy with Taxol Herceptin followed by Herceptin maintenance.  Stopped after 11 cycles of Taxol Herceptin maintenance completed 07/07/2021  2.  Adjuvant radiation therapy 11/23/2020-12/19/2020 3.  Adjuvant antiestrogen therapy with tamoxifen started October 2022 4.  CT angiogram 07/13/2021: Right supraclavicular lymph node 5 cm, right internal mammary lymph nodes 1.8 cm 5.  Metastatic breast cancer: June 2023:Right supraclavicular lymphadenopathy: Biopsy: Metastatic poorly differentiated adenocarcinoma, ER +40%, PR negative, HER2 positive 3+ PET CT scan 08/06/21: Ext Right Supra clav LN along with Internal mammary and Rt Upper Mediastinal and Rt Axillary LN -----------------------------------------------------------------------------------------------------  Current treatment: Enhertu started on 08/21/2021, today cycle 2 Enhertu toxicities: 1,.  Severe nausea lasted for 2 weeks requiring IV fluids 2. severe diarrhea lasting 3 to 4 days requiring Lomotil and Imodium 3.  Severe fatigue 4.  Chemotherapy-induced anemia: Monitoring closely  We will reduce the dosage of her treatment. I will add Emend and I will send a prescription for dexamethasone tablets to be taken on days 2 3 and 4. Will make appointments for IV fluids for 7/15 and 09/18/2021.  Plan to obtain CT scans after 3 cycles of chemo    No orders of the defined types were placed in this encounter.  The patient has a good understanding of the overall plan. she agrees with it. she will call with any problems that may develop before the next visit here. Total time spent: 30 mins including face to face time and time spent for planning, charting and co-ordination of care   Harriette Ohara, MD 09/15/21    I Gardiner Coins am scribing for Dr.  Lindi Adie  I have reviewed the above documentation for accuracy and completeness, and I agree with the above.

## 2021-09-01 NOTE — Progress Notes (Signed)
[facility] Cancer Follow up:    Pamela Peng, MD [address] [place] [address]   DIAGNOSIS:  Cancer Staging  Malignant neoplasm of lower-outer quadrant of right breast of female, estrogen receptor positive (HCC) Staging form: Breast, AJCC 8th Edition - Clinical stage from 05/11/2020: Stage IA (cT1b, cN0, cM0, G3, ER+, PR-, HER2+) - Signed by Serena Croissant, MD on 05/11/2020 Stage prefix: Initial diagnosis - Pathologic stage from 06/02/2020: Stage IA (pT1c, pN0, cM0, G3, ER+, PR-, HER2+) - Signed by Loa Socks, NP on 06/15/2020 Stage prefix: Initial diagnosis Histologic grading system: 3 grade system   SUMMARY OF ONCOLOGIC HISTORY: Oncology History  Malignant neoplasm of lower-outer quadrant of right breast of female, estrogen receptor positive (HCC)  05/03/2020 Initial Diagnosis   Screening mammogram showed a 1.0cm mass at the 6 o'clock position in the right breast. Diagnostic mammogram and US showed the 1.2cm mass at the 6 o'clock position in the right breast. Biopsy showed IDC, grade 3, HER-2 positive (3+), ER 15% weak, PR-, Ki67 80%.   05/11/2020 Cancer Staging   Staging form: Breast, AJCC 8th Edition - Clinical stage from 05/11/2020: Stage IA (cT1b, cN0, cM0, G3, ER+, PR-, HER2+) - Signed by Serena Croissant, MD on 05/11/2020 Stage prefix: Initial diagnosis   05/19/2020 Genetic Testing   Negative hereditary cancer genetic testing: no pathogenic variants detected in [facility] Breast STAT Panel and Common Hereditary Cancers Panel.  The report dates are May 19, 2020 (STAT) and May 23, 2020 (Common Hereditary).   The Common Hereditary Cancers Panel offered by [facility] includes sequencing and/or deletion duplication testing of the following 47 genes: APC, ATM, AXIN2, BARD1, BMPR1A, BRCA1, BRCA2, BRIP1, CDH1, CDK4, CDKN2A (p14ARF), CDKN2A (p16INK4a), CHEK2, CTNNA1, DICER1, EPCAM (Deletion/duplication testing only), GREM1 (promoter region  deletion/duplication testing only), GREM1, HOXB13, KIT, MEN1, MLH1, MSH2, MSH3, MSH6, MUTYH, NBN, NF1, NHTL1, PALB2, PDGFRA, PMS2, POLD1, POLE, PTEN, RAD50, RAD51C, RAD51D, SDHA, SDHB, SDHC, SDHD, SMAD4, SMARCA4. STK11, TP53, TSC1, TSC2, and VHL.  The following genes were evaluated for sequence changes only: SDHA and HOXB13 c.251G>A variant only.es only: SDHA and HOXB13 c.251G>A variant only.   06/02/2020 Surgery   Right lumpectomy ([facility]): invasive and in situ ductal carcinoma, 1.3cm, clear margins, 1 right axillary lymph node negative for carcinoma.   06/02/2020 Cancer Staging   Staging form: Breast, AJCC 8th Edition - Pathologic stage from 06/02/2020: Stage IA (pT1c, pN0, cM0, G3, ER+, PR-, HER2+) - Signed by Loa Socks, NP on 06/15/2020 Stage prefix: Initial diagnosis Histologic grading system: 3 grade system   07/08/2020 - 10/21/2020 Chemotherapy   Taxol Herceptin followed by Herceptin maintenance   11/11/2020 -  Chemotherapy   Herceptin maintenance       04/14/2021 -  Anti-estrogen oral therapy   Tamoxifen 10 mg   08/21/2021 -  Chemotherapy   Patient is on Treatment Plan : BREAST METASTATIC fam-trastuzumab deruxtecan-nxki (Enhertu) q21d       CURRENT THERAPY: Enhertu  INTERVAL HISTORY: Pamela [name] 56 y.o. female returns for f/u of Enhertu.  She is here today for IV fluids.  Since receiving Enhertu she has developed some nausea along with diarrhea.  She tells me the diarrhea seems to be coming under control with Imodium, however she always seems to experience a sour feeling in her stomach.  She denies any other issues today.     Patient Active Problem List   Diagnosis Date Noted   Breast cancer (HCC) 08/25/2021   Nausea without vomiting  08/25/2021   Prediabetes 03/13/2021   Non-intractable vomiting 07/13/2020   Port-A-Cath in place 07/08/2020   Genetic testing 05/20/2020   Family history of breast cancer 05/12/2020   Family history of lung cancer 05/12/2020    Malignant neoplasm of lower-outer quadrant of right breast of female, estrogen receptor positive (HCC) 05/03/2020   Elevated troponin level not due myocardial infarction 09/18/2019   Mixed hyperlipidemia 09/18/2019   Hypertensive emergency 09/17/2019   Primary osteoarthritis of right hip 08/06/2018   Seasonal allergies 06/12/2018   Fibrocystic disease of breast 05/27/2018   Obstructive sleep apnea syndrome, moderate 10/27/2013   Shift work sleep disorder 10/27/2013   Psychic factors associated with diseases classified elsewhere 08/07/2013   High cholesterol 07/21/2013   Essential hypertension 07/21/2013   Snoring 07/21/2013   IUD contraception-inserted 10/01/11 10/01/2011   Class 3 severe obesity due to excess calories with serious comorbidity and body mass index (BMI) of 45.0 to 49.9 in adult (HCC) 09/03/2011    is allergic to pollen extract, latex, codeine, shellfish allergy, and betadine [povidone iodine].  MEDICAL HISTORY: Past Medical History:  Diagnosis Date   Anxiety    Asthma    Back pain    Breast cancer (HCC)    Constipation    DUB (dysfunctional uterine bleeding) 2007   Edema of both lower extremities    Elevated cholesterol    Family history of breast cancer 05/12/2020   Family history of lung cancer 05/12/2020   Food allergy    H/O menorrhagia 05/01/2006   High cholesterol    History of ovarian cyst 2007   Hypertension 03/31/2005   Increased BMI 07/11/2006   Joint pain    Migraine    N&V (nausea and vomiting)    Obesity 2007   Obstructive sleep apnea syndrome, moderate 10/27/2013   no cpap   Sleep apnea    SOB (shortness of breath)    Vitamin D deficiency    Vitamin D deficiency    Weight loss 07/11/2006    SURGICAL HISTORY: Past Surgical History:  Procedure Laterality Date   BREAST LUMPECTOMY WITH RADIOACTIVE SEED AND SENTINEL LYMPH NODE BIOPSY Right 06/02/2020   Procedure: RIGHT BREAST LUMPECTOMY WITH RADIOACTIVE SEED AND RIGHT SENTINEL LYMPH  NODE BIOPSY;  Surgeon: Harriette Bouillon, MD;  Location: [facility];  Service: General;  Laterality: Right;   BREAST REDUCTION SURGERY  05/1991   CESAREAN SECTION     HYSTEROSCOPY  05/01/2006   PORTACATH PLACEMENT Right 06/02/2020   Procedure: INSERTION PORT-A-CATH;  Surgeon: Harriette Bouillon, MD;  Location: [facility];  Service: General;  Laterality: Right;    SOCIAL HISTORY: Social History   Socioeconomic History   Marital status: Single    Spouse name: Not on file   Number of children: Not on file   Years of education: Not on file   Highest education level: Not on file  Occupational History   Occupation: Child psychotherapist  Tobacco Use   Smoking status: Never   Smokeless tobacco: Never  Vaping Use   Vaping Use: Never used  Substance and Sexual Activity   Alcohol use: No   Drug use: Not Currently    Frequency: 2.0 times per week   Sexual activity: Not Currently    Birth control/protection: I.U.D.    Comment: Mirena  Other Topics Concern   Not on file  Social History Narrative   Not on file   Social Determinants of Health   Financial Resource Strain: Not on file  Food  Insecurity: Not on file  Transportation Needs: Not on file  Physical Activity: Not on file  Stress: Not on file  Social Connections: Not on file  Intimate Partner Violence: Not on file    FAMILY HISTORY: Family History  Problem Relation Age of Onset   Hypertension Mother    High Cholesterol Mother    Thyroid disease Mother    Cancer Mother    Sleep apnea Mother    Hypertension Father    Heart attack Father    Sudden death Father    Diabetes Maternal Grandmother    Bone cancer Maternal Grandfather        dx after 13   Breast cancer Maternal Aunt        dx 30s   Lung cancer Maternal Aunt        dx after 50    Review of Systems  Constitutional:  Negative for appetite change, chills, fatigue, fever and unexpected weight change.  HENT:   Negative for hearing loss,  lump/mass and trouble swallowing.   Eyes:  Negative for eye problems and icterus.  Respiratory:  Negative for chest tightness, cough and shortness of breath.   Cardiovascular:  Negative for chest pain, leg swelling and palpitations.  Gastrointestinal:  Negative for abdominal distention, abdominal pain, constipation, diarrhea, nausea and vomiting.  Endocrine: Negative for hot flashes.  Genitourinary:  Negative for difficulty urinating.   Musculoskeletal:  Negative for arthralgias.  Skin:  Negative for itching and rash.  Neurological:  Negative for dizziness, extremity weakness, headaches and numbness.  Hematological:  Negative for adenopathy. Does not bruise/bleed easily.  Psychiatric/Behavioral:  Negative for depression. The patient is not nervous/anxious.       PHYSICAL EXAMINATION  ECOG PERFORMANCE STATUS: 2 - Symptomatic, <50% confined to bed  There were no vitals filed for this visit.  Physical Exam Constitutional:      General: She is not in acute distress.    Appearance: Normal appearance. She is not toxic-appearing.  HENT:     Head: Normocephalic and atraumatic.  Eyes:     General: No scleral icterus. Cardiovascular:     Rate and Rhythm: Normal rate and regular rhythm.     Pulses: Normal pulses.     Heart sounds: Normal heart sounds.  Pulmonary:     Effort: Pulmonary effort is normal.     Breath sounds: Normal breath sounds.  Abdominal:     General: Abdomen is flat. Bowel sounds are normal. There is no distension.     Palpations: Abdomen is soft.     Tenderness: There is no abdominal tenderness.  Musculoskeletal:        General: No swelling.     Cervical back: Neck supple.  Lymphadenopathy:     Cervical: No cervical adenopathy.  Skin:    General: Skin is warm and dry.     Findings: No rash.  Neurological:     General: No focal deficit present.     Mental Status: She is alert.  Psychiatric:        Mood and Affect: Mood normal.        Behavior: Behavior  normal.     LABORATORY DATA:  CBC    Component Value Date/Time   WBC 3.0 (L) 09/01/2021 1035   WBC 6.9 07/21/2021 1143   RBC 4.32 09/01/2021 1035   HGB 11.4 (L) 09/01/2021 1035   HGB 13.2 09/22/2019 1040   HCT 34.0 (L) 09/01/2021 1035   HCT 41.3 09/22/2019 1040   PLT  144 (L) 09/01/2021 1035   PLT 247 09/22/2019 1040   MCV 78.7 (L) 09/01/2021 1035   MCV 83 09/22/2019 1040   MCH 26.4 09/01/2021 1035   MCHC 33.5 09/01/2021 1035   RDW 13.2 09/01/2021 1035   RDW 14.7 09/22/2019 1040   LYMPHSABS 1.1 09/01/2021 1035   MONOABS 0.2 09/01/2021 1035   EOSABS 0.3 09/01/2021 1035   BASOSABS 0.0 09/01/2021 1035    CMP     Component Value Date/Time   NA 138 09/01/2021 1035   NA 142 09/22/2019 1040   K 3.3 (L) 09/01/2021 1035   CL 101 09/01/2021 1035   CO2 28 09/01/2021 1035   GLUCOSE 132 (H) 09/01/2021 1035   BUN 13 09/01/2021 1035   BUN 11 09/22/2019 1040   CREATININE 0.97 09/01/2021 1035   CALCIUM 9.3 09/01/2021 1035   PROT 6.7 09/01/2021 1035   PROT 6.7 06/17/2019 1054   ALBUMIN 3.6 09/01/2021 1035   ALBUMIN 4.5 06/17/2019 1054   AST 24 09/01/2021 1035   ALT 26 09/01/2021 1035   ALKPHOS 62 09/01/2021 1035   BILITOT 1.0 09/01/2021 1035   GFRNONAA >60 09/01/2021 1035   GFRAA 77 09/22/2019 1040      ASSESSMENT and THERAPY PLAN:   Malignant neoplasm of lower-outer quadrant of right breast of female, estrogen receptor positive (HCC) 05/03/2020:Screening mammogram showed a 1.0cm mass at the 6 o'clock position in the right breast. Diagnostic mammogram and US showed the 1.2cm mass at the 6 o'clock position in the right breast. Biopsy showed IDC, grade 3, HER-2 positive (3+), ER 15% weak, PR-, Ki67 80%.   06/02/2020:Right lumpectomy ([facility]): invasive and in situ ductal carcinoma, 1.3cm, clear margins, 1 right axillary lymph node negative for carcinoma. ER 15% weak, PR-,HER-2 positive (3+) Ki67 80%.   Treatment plan: 1.  Adjuvant chemotherapy with Taxol Herceptin followed by  Herceptin maintenance.  Stopped after 11 cycles of Taxol Herceptin maintenance completed 07/07/2021  2.  Adjuvant radiation therapy 11/23/2020-12/19/2020 3.  Adjuvant antiestrogen therapy with tamoxifen started October 2022 4.  CT angiogram 07/13/2021: Right supraclavicular lymph node 5 cm, right internal mammary lymph nodes 1.8 cm ----------------------------------------------------------------------------------------------------- Pamela [name] is receiving Enhertu and is having some difficulty tolerating the initial treatment.    She is receiving IV fluids and is taking imodium.  I reviewed her med list and sent in Omeprazole for her to take daily to help with the acidity in her stomach which can contribute to the nauseated feeling.    She will receive IV fluids tomorrow and will call us if the diarrhea returns.     All questions were answered. The patient knows to call the clinic with any problems, questions or concerns. We can certainly see the patient much sooner if necessary.  Total encounter time:20 minutes*in face-to-face visit time, chart review, lab review, care coordination, order entry, and documentation of the encounter time.    Lillard Anes, NP 09/06/21 3:13 PM Medical Oncology and Hematology [facility] [address] [place], [place] [address] Tel. [phone]    Fax. [phone]  *Total Encounter Time as defined by the Centers for Medicare and Medicaid Services includes, in addition to the face-to-face time of a patient visit (documented in the note above) non-face-to-face time: obtaining and reviewing outside history, ordering and reviewing medications, tests or procedures, care coordination (communications with other health care professionals or caregivers) and documentation in the medical record.

## 2021-09-02 ENCOUNTER — Inpatient Hospital Stay: Payer: 59 | Attending: Hematology and Oncology

## 2021-09-02 VITALS — BP 125/62 | HR 59 | Temp 97.7°F | Resp 16

## 2021-09-02 DIAGNOSIS — R110 Nausea: Secondary | ICD-10-CM | POA: Diagnosis not present

## 2021-09-02 DIAGNOSIS — Z793 Long term (current) use of hormonal contraceptives: Secondary | ICD-10-CM | POA: Diagnosis not present

## 2021-09-02 DIAGNOSIS — Z170 Estrogen receptor positive status [ER+]: Secondary | ICD-10-CM | POA: Diagnosis not present

## 2021-09-02 DIAGNOSIS — Z79899 Other long term (current) drug therapy: Secondary | ICD-10-CM | POA: Diagnosis not present

## 2021-09-02 DIAGNOSIS — R5383 Other fatigue: Secondary | ICD-10-CM | POA: Insufficient documentation

## 2021-09-02 DIAGNOSIS — T451X5A Adverse effect of antineoplastic and immunosuppressive drugs, initial encounter: Secondary | ICD-10-CM | POA: Diagnosis not present

## 2021-09-02 DIAGNOSIS — R197 Diarrhea, unspecified: Secondary | ICD-10-CM | POA: Insufficient documentation

## 2021-09-02 DIAGNOSIS — Z7952 Long term (current) use of systemic steroids: Secondary | ICD-10-CM | POA: Diagnosis not present

## 2021-09-02 DIAGNOSIS — Z885 Allergy status to narcotic agent status: Secondary | ICD-10-CM | POA: Diagnosis not present

## 2021-09-02 DIAGNOSIS — D6481 Anemia due to antineoplastic chemotherapy: Secondary | ICD-10-CM | POA: Diagnosis not present

## 2021-09-02 DIAGNOSIS — C50511 Malignant neoplasm of lower-outer quadrant of right female breast: Secondary | ICD-10-CM | POA: Insufficient documentation

## 2021-09-02 DIAGNOSIS — Z5112 Encounter for antineoplastic immunotherapy: Secondary | ICD-10-CM | POA: Insufficient documentation

## 2021-09-02 DIAGNOSIS — Z95828 Presence of other vascular implants and grafts: Secondary | ICD-10-CM

## 2021-09-02 DIAGNOSIS — C50919 Malignant neoplasm of unspecified site of unspecified female breast: Secondary | ICD-10-CM

## 2021-09-02 MED ORDER — HEPARIN SOD (PORK) LOCK FLUSH 100 UNIT/ML IV SOLN
500.0000 [IU] | Freq: Once | INTRAVENOUS | Status: AC
  Administered 2021-09-02: 500 [IU]

## 2021-09-02 MED ORDER — SODIUM CHLORIDE 0.9 % IV SOLN: INTRAVENOUS | Status: AC

## 2021-09-02 MED ORDER — SODIUM CHLORIDE 0.9% FLUSH
10.0000 mL | Freq: Once | INTRAVENOUS | Status: AC
  Administered 2021-09-02: 10 mL

## 2021-09-02 NOTE — Progress Notes (Signed)
Pt ambulated out of facility> Advised pt to call facility for any concerns

## 2021-09-04 ENCOUNTER — Other Ambulatory Visit: Payer: Self-pay | Admitting: Adult Health

## 2021-09-04 ENCOUNTER — Telehealth: Payer: Self-pay | Admitting: *Deleted

## 2021-09-04 MED ORDER — DIPHENOXYLATE-ATROPINE 2.5-0.025 MG PO TABS: 1.0000 | ORAL_TABLET | Freq: Four times a day (QID) | ORAL | 0 refills | Status: DC | PRN

## 2021-09-04 NOTE — Telephone Encounter (Signed)
Received call from pt with complaint of ongoing diarrhea not alleviated by OTC imodium. Pt states she experiences diarrhea x4 episodes a day.  Pt denies s/s of dehydration at this time including weakness/fatigue.  Pt states she is able to drink fluids with no issues.  RN reviewed with NP and orders placed for Lomotil.  RN reviewed medication administration instructions with pt as well as hydration, pt verbalized understanding.

## 2021-09-04 NOTE — Progress Notes (Signed)
Lomotil called in as patient is continuing to experience diarrhea despite imodium

## 2021-09-06 ENCOUNTER — Encounter: Payer: Self-pay | Admitting: Hematology and Oncology

## 2021-09-06 NOTE — Assessment & Plan Note (Signed)
05/03/2020:Screening mammogram showed a 1.0cm mass at the 6 o'clock position in the right breast. Diagnostic mammogram and US showed the 1.2cm mass at the 6 o'clock position in the right breast. Biopsy showed IDC, grade 3, HER-2 positive (3+), ER 15% weak, PR-, Ki67 80%.  06/02/2020:Right lumpectomy ([facility]): invasive and in situ ductal carcinoma, 1.3cm, clear margins, 1 right axillary lymph node negative for carcinoma. ER 15% weak, PR-,HER-2 positive (3+) Ki67 80%.  Treatment plan: 1.Adjuvant chemotherapy with Taxol Herceptinfollowed byHerceptin maintenance.Stopped after 11 cycles of TaxolHerceptin maintenance completed 07/07/2021 2.Adjuvant radiation therapy9/21/2022-12/19/2020 3.Adjuvant antiestrogen therapywith tamoxifen started October 2022 4.  CT angiogram 07/13/2021: Right supraclavicular lymph node 5 cm, right internal mammary lymph nodes 1.8 cm ----------------------------------------------------------------------------------------------------- Pamela [name] is receiving Enhertu and is having some difficulty tolerating the initial treatment.    She is receiving IV fluids and is taking imodium.  I reviewed her med list and sent in Omeprazole for her to take daily to help with the acidity in her stomach which can contribute to the nauseated feeling.    She will receive IV fluids tomorrow and will call us if the diarrhea returns.

## 2021-09-07 ENCOUNTER — Encounter (HOSPITAL_COMMUNITY): Payer: Self-pay

## 2021-09-11 ENCOUNTER — Inpatient Hospital Stay: Payer: 59 | Admitting: Hematology and Oncology

## 2021-09-11 ENCOUNTER — Inpatient Hospital Stay: Payer: 59

## 2021-09-11 ENCOUNTER — Telehealth: Payer: Self-pay

## 2021-09-11 ENCOUNTER — Ambulatory Visit: Payer: 59

## 2021-09-11 NOTE — Telephone Encounter (Signed)
Pt called and states she has cancer claim form she needs filled out per provider for benefits from insurance co. Pt understands she can scan this into MyChart and send to [place] or drop off form at [facility] and we will complete & attach appropriate documents and fax. She verbalized thanks and understanding.

## 2021-09-14 MED FILL — Dexamethasone Sodium Phosphate Inj 100 MG/10ML: INTRAMUSCULAR | Qty: 1 | Status: AC

## 2021-09-15 ENCOUNTER — Other Ambulatory Visit: Payer: Self-pay

## 2021-09-15 ENCOUNTER — Inpatient Hospital Stay: Payer: 59

## 2021-09-15 ENCOUNTER — Telehealth: Payer: Self-pay | Admitting: Hematology and Oncology

## 2021-09-15 ENCOUNTER — Inpatient Hospital Stay (HOSPITAL_BASED_OUTPATIENT_CLINIC_OR_DEPARTMENT_OTHER): Payer: 59 | Admitting: Hematology and Oncology

## 2021-09-15 ENCOUNTER — Encounter: Payer: Self-pay | Admitting: *Deleted

## 2021-09-15 DIAGNOSIS — Z5112 Encounter for antineoplastic immunotherapy: Secondary | ICD-10-CM | POA: Diagnosis not present

## 2021-09-15 DIAGNOSIS — C50511 Malignant neoplasm of lower-outer quadrant of right female breast: Secondary | ICD-10-CM

## 2021-09-15 DIAGNOSIS — Z170 Estrogen receptor positive status [ER+]: Secondary | ICD-10-CM | POA: Diagnosis not present

## 2021-09-15 LAB — CMP (CANCER CENTER ONLY)
ALT: 12 U/L (ref 0–44)
AST: 13 U/L — ABNORMAL LOW (ref 15–41)
Albumin: 3.7 g/dL (ref 3.5–5.0)
Alkaline Phosphatase: 54 U/L (ref 38–126)
Anion gap: 5 (ref 5–15)
BUN: 14 mg/dL (ref 6–20)
CO2: 30 mmol/L (ref 22–32)
Calcium: 9.2 mg/dL (ref 8.9–10.3)
Chloride: 107 mmol/L (ref 98–111)
Creatinine: 0.81 mg/dL (ref 0.44–1.00)
GFR, Estimated: >60 mL/min (ref >60)
Glucose, Bld: 115 mg/dL — ABNORMAL HIGH (ref 70–99)
Potassium: 3.4 mmol/L — ABNORMAL LOW (ref 3.5–5.1)
Sodium: 142 mmol/L (ref 135–145)
Total Bilirubin: 0.5 mg/dL (ref 0.3–1.2)
Total Protein: 6.6 g/dL (ref 6.5–8.1)

## 2021-09-15 LAB — CBC WITH DIFFERENTIAL (CANCER CENTER ONLY)
Abs Immature Granulocytes: 0.06 10*3/uL (ref 0.00–0.07)
Basophils Absolute: 0.1 10*3/uL (ref 0.0–0.1)
Basophils Relative: 1 %
Eosinophils Absolute: 0.1 10*3/uL (ref 0.0–0.5)
Eosinophils Relative: 1 %
HCT: 32 % — ABNORMAL LOW (ref 36.0–46.0)
Hemoglobin: 10.4 g/dL — ABNORMAL LOW (ref 12.0–15.0)
Immature Granulocytes: 1 %
Lymphocytes Relative: 23 %
Lymphs Abs: 1.3 10*3/uL (ref 0.7–4.0)
MCH: 26.6 pg (ref 26.0–34.0)
MCHC: 32.5 g/dL (ref 30.0–36.0)
MCV: 81.8 fL (ref 80.0–100.0)
Monocytes Absolute: 0.7 10*3/uL (ref 0.1–1.0)
Monocytes Relative: 12 %
Neutro Abs: 3.6 10*3/uL (ref 1.7–7.7)
Neutrophils Relative %: 62 %
Platelet Count: 225 10*3/uL (ref 150–400)
RBC: 3.91 MIL/uL (ref 3.87–5.11)
RDW: 15.1 % (ref 11.5–15.5)
WBC Count: 5.7 10*3/uL (ref 4.0–10.5)
nRBC: 0.3 % — ABNORMAL HIGH (ref 0.0–0.2)

## 2021-09-15 MED ORDER — SODIUM CHLORIDE 0.9% FLUSH
10.0000 mL | Freq: Once | INTRAVENOUS | Status: AC
  Administered 2021-09-15: 10 mL via INTRAVENOUS

## 2021-09-15 MED ORDER — SODIUM CHLORIDE 0.9 % IV SOLN
10.0000 mg | Freq: Once | INTRAVENOUS | Status: AC
  Administered 2021-09-15: 10 mg via INTRAVENOUS
  Filled 2021-09-15: qty 10

## 2021-09-15 MED ORDER — DIPHENHYDRAMINE HCL 25 MG PO CAPS
50.0000 mg | ORAL_CAPSULE | Freq: Once | ORAL | Status: AC
  Administered 2021-09-15: 50 mg via ORAL
  Filled 2021-09-15: qty 2

## 2021-09-15 MED ORDER — HEPARIN SOD (PORK) LOCK FLUSH 100 UNIT/ML IV SOLN
500.0000 [IU] | Freq: Once | INTRAVENOUS | Status: AC | PRN
  Administered 2021-09-15: 500 [IU]

## 2021-09-15 MED ORDER — PALONOSETRON HCL INJECTION 0.25 MG/5ML
0.2500 mg | Freq: Once | INTRAVENOUS | Status: AC
  Administered 2021-09-15: 0.25 mg via INTRAVENOUS
  Filled 2021-09-15: qty 5

## 2021-09-15 MED ORDER — SODIUM CHLORIDE 0.9% FLUSH
10.0000 mL | INTRAVENOUS | Status: DC | PRN
  Administered 2021-09-15: 10 mL

## 2021-09-15 MED ORDER — SODIUM CHLORIDE 0.9 % IV SOLN
150.0000 mg | Freq: Once | INTRAVENOUS | Status: AC
  Administered 2021-09-15: 150 mg via INTRAVENOUS
  Filled 2021-09-15: qty 150

## 2021-09-15 MED ORDER — ACETAMINOPHEN 325 MG PO TABS
650.0000 mg | ORAL_TABLET | Freq: Once | ORAL | Status: AC
  Administered 2021-09-15: 650 mg via ORAL
  Filled 2021-09-15: qty 2

## 2021-09-15 MED ORDER — DEXTROSE 5 % IV SOLN: Freq: Once | INTRAVENOUS | Status: AC

## 2021-09-15 MED ORDER — DEXAMETHASONE 4 MG PO TABS: 4.0000 mg | ORAL_TABLET | Freq: Two times a day (BID) | ORAL | 1 refills | Status: DC

## 2021-09-15 MED ORDER — FAM-TRASTUZUMAB DERUXTECAN-NXKI CHEMO 100 MG IV SOLR
600.0000 mg | Freq: Once | INTRAVENOUS | Status: AC
  Administered 2021-09-15: 600 mg via INTRAVENOUS
  Filled 2021-09-15: qty 30

## 2021-09-15 NOTE — Assessment & Plan Note (Addendum)
05/03/2020:Screening mammogram showed a 1.0cm mass at the 6 o'clock position in the right breast. Diagnostic mammogram and US showed the 1.2cm mass at the 6 o'clock position in the right breast. Biopsy showed IDC, grade 3, HER-2 positive (3+), ER 15% weak, PR-, Ki67 80%.  06/02/2020:Right lumpectomy ([facility]): invasive and in situ ductal carcinoma, 1.3cm, clear margins, 1 right axillary lymph node negative for carcinoma. ER 15% weak, PR-,HER-2 positive (3+) Ki67 80%.  Treatment plan: 1.Adjuvant chemotherapy with Taxol Herceptinfollowed byHerceptin maintenance.Stopped after 11 cycles of TaxolHerceptin maintenance completed 07/07/2021 2.Adjuvant radiation therapy9/21/2022-12/19/2020 3.Adjuvant antiestrogen therapywith tamoxifen started October 2022 4.CT angiogram 07/13/2021: Right supraclavicular lymph node 5 cm, right internal mammary lymph nodes 1.8 cm 5.  Metastatic breast cancer: June 2023:Right supraclavicular lymphadenopathy:Biopsy: Metastatic poorly differentiated adenocarcinoma, ER +40%, PR negative, HER2 positive 3+ PET CT scan 08/06/21: Ext Right Supra clav LN along with Internal mammary and Rt Upper Mediastinal and Rt Axillary LN -----------------------------------------------------------------------------------------------------  Current treatment: Enhertu started on 08/21/2021, today cycle 2 Enhertu toxicities: 1,.  Severe nausea lasted for 2 weeks requiring IV fluids 2. severe diarrhea lasting 3 to 4 days requiring Lomotil and Imodium 3.  Severe fatigue 4.  Chemotherapy-induced anemia: Monitoring closely  We will reduce the dosage of her treatment. I will add Emend and I will send a prescription for dexamethasone tablets to be taken on days 2 3 and 4. Will make appointments for IV fluids for 7/15 and 09/18/2021.  Plan to obtain CT scans after 3 cycles of chemo

## 2021-09-15 NOTE — Progress Notes (Signed)
RN provided pt with completed [employer] paperwork completed by MD for Cancer Policy.

## 2021-09-15 NOTE — Telephone Encounter (Signed)
Scheduled appointment per 7/14 los. Patient is aware.

## 2021-09-16 ENCOUNTER — Inpatient Hospital Stay: Payer: 59

## 2021-09-16 VITALS — BP 133/70 | HR 76 | Temp 97.2°F | Resp 20

## 2021-09-16 DIAGNOSIS — R110 Nausea: Secondary | ICD-10-CM

## 2021-09-16 DIAGNOSIS — C50511 Malignant neoplasm of lower-outer quadrant of right female breast: Secondary | ICD-10-CM

## 2021-09-16 DIAGNOSIS — C50919 Malignant neoplasm of unspecified site of unspecified female breast: Secondary | ICD-10-CM

## 2021-09-16 DIAGNOSIS — Z95828 Presence of other vascular implants and grafts: Secondary | ICD-10-CM

## 2021-09-16 DIAGNOSIS — Z5112 Encounter for antineoplastic immunotherapy: Secondary | ICD-10-CM | POA: Diagnosis not present

## 2021-09-16 MED ORDER — HEPARIN SOD (PORK) LOCK FLUSH 100 UNIT/ML IV SOLN
500.0000 [IU] | Freq: Once | INTRAVENOUS | Status: AC
  Administered 2021-09-16: 500 [IU]

## 2021-09-16 MED ORDER — SODIUM CHLORIDE 0.9 % IV SOLN: INTRAVENOUS | Status: AC

## 2021-09-16 MED ORDER — ONDANSETRON HCL 4 MG/2ML IJ SOLN
8.0000 mg | Freq: Once | INTRAMUSCULAR | Status: AC
  Administered 2021-09-16: 8 mg via INTRAVENOUS
  Filled 2021-09-16: qty 4

## 2021-09-16 MED ORDER — DEXAMETHASONE SODIUM PHOSPHATE 10 MG/ML IJ SOLN: 10.0000 mg | Freq: Once | INTRAMUSCULAR | Status: AC

## 2021-09-16 MED ORDER — DEXAMETHASONE SODIUM PHOSPHATE 10 MG/ML IJ SOLN
INTRAMUSCULAR | Status: AC
  Administered 2021-09-16: 10 mg via INTRAVENOUS
  Filled 2021-09-16: qty 1

## 2021-09-16 MED ORDER — SODIUM CHLORIDE 0.9% FLUSH
10.0000 mL | Freq: Once | INTRAVENOUS | Status: AC
  Administered 2021-09-16: 10 mL

## 2021-09-16 MED ORDER — CYANOCOBALAMIN 1000 MCG/ML IJ SOLN: 1000.0000 ug | Freq: Once | INTRAMUSCULAR | Status: DC

## 2021-09-16 MED ORDER — SODIUM CHLORIDE 0.9 % IV SOLN: 10.0000 mg | Freq: Once | INTRAVENOUS | Status: DC

## 2021-09-18 ENCOUNTER — Other Ambulatory Visit: Payer: Self-pay

## 2021-09-18 ENCOUNTER — Inpatient Hospital Stay: Payer: 59

## 2021-09-18 VITALS — BP 119/67 | HR 60 | Temp 97.8°F | Resp 16 | Wt 308.2 lb

## 2021-09-18 DIAGNOSIS — Z95828 Presence of other vascular implants and grafts: Secondary | ICD-10-CM

## 2021-09-18 DIAGNOSIS — Z5112 Encounter for antineoplastic immunotherapy: Secondary | ICD-10-CM | POA: Diagnosis not present

## 2021-09-18 DIAGNOSIS — C50511 Malignant neoplasm of lower-outer quadrant of right female breast: Secondary | ICD-10-CM

## 2021-09-18 DIAGNOSIS — R110 Nausea: Secondary | ICD-10-CM

## 2021-09-18 DIAGNOSIS — C50919 Malignant neoplasm of unspecified site of unspecified female breast: Secondary | ICD-10-CM

## 2021-09-18 MED ORDER — HEPARIN SOD (PORK) LOCK FLUSH 100 UNIT/ML IV SOLN
500.0000 [IU] | Freq: Once | INTRAVENOUS | Status: AC
  Administered 2021-09-18: 500 [IU]

## 2021-09-18 MED ORDER — SODIUM CHLORIDE 0.9 % IV SOLN
10.0000 mg | Freq: Once | INTRAVENOUS | Status: DC
  Filled 2021-09-18: qty 1

## 2021-09-18 MED ORDER — SODIUM CHLORIDE 0.9 % IV SOLN: Freq: Once | INTRAVENOUS | Status: AC

## 2021-09-18 MED ORDER — SODIUM CHLORIDE 0.9% FLUSH
10.0000 mL | Freq: Once | INTRAVENOUS | Status: AC
  Administered 2021-09-18: 10 mL

## 2021-09-18 NOTE — Progress Notes (Signed)
Correction made to patient's [employer] paperwork. Per Dr Lindi Adie, pt was in remission from original diagnosis 06/02/2020 when she had right lumpectomy performed. Corrected paperwork faxed to [place]. Pt is aware.

## 2021-09-18 NOTE — Patient Instructions (Signed)
Rehydration, Adult Rehydration is the replacement of body fluids, salts, and minerals (electrolytes) that are lost during dehydration. Dehydration is when there is not enough water or other fluids in the body. This happens when you lose more fluids than you take in. Common causes of dehydration include: Not drinking enough fluids. This can occur when you are ill or doing activities that require a lot of energy, especially in hot weather. Conditions that cause loss of water or other fluids, such as diarrhea, vomiting, sweating, or urinating a lot. Other illnesses, such as fever or infection. Certain medicines, such as those that remove excess fluid from the body (diuretics). Symptoms of mild or moderate dehydration may include thirst, dry lips and mouth, and dizziness. Symptoms of severe dehydration may include increased heart rate, confusion, fainting, and not urinating. For severe dehydration, you may need to get fluids through an IV at the hospital. For mild or moderate dehydration, you can usually rehydrate at home by drinking certain fluids as told by your health care provider. What are the risks? Generally, rehydration is safe. However, taking in too much fluid (overhydration) can be a problem. This is rare. Overhydration can cause an electrolyte imbalance, kidney failure, or a decrease in salt (sodium) levels in the body. Supplies needed You will need an oral rehydration solution (ORS) if your health care provider tells you to use one. This is a drink to treat dehydration. It can be found in pharmacies and retail stores. How to rehydrate Fluids Follow instructions from your health care provider for rehydration. The kind of fluid and the amount you should drink depend on your condition. In general, you should choose drinks that you prefer. If told by your health care provider, drink an ORS. Make an ORS by following instructions on the package. Start by drinking small amounts, about  cup (120  mL) every 5-10 minutes. Slowly increase how much you drink until you have taken the amount recommended by your health care provider. Drink enough clear fluids to keep your urine pale yellow. If you were told to drink an ORS, finish it first, then start slowly drinking other clear fluids. Drink fluids such as: Water. This includes sparkling water and flavored water. Drinking only water can lead to having too little sodium in your body (hyponatremia). Follow the advice of your health care provider. Water from ice chips you suck on. Fruit juice with water you add to it (diluted). Sports drinks. Hot or cold herbal teas. Broth-based soups. Milk or milk products. Food Follow instructions from your health care provider about what to eat while you rehydrate. Your health care provider may recommend that you slowly begin eating regular foods in small amounts. Eat foods that contain a healthy balance of electrolytes, such as bananas, oranges, potatoes, tomatoes, and spinach. Avoid foods that are greasy or contain a lot of sugar. In some cases, you may get nutrition through a feeding tube that is passed through your nose and into your stomach (nasogastric tube, or NG tube). This may be done if you have uncontrolled vomiting or diarrhea. Beverages to avoid  Certain beverages may make dehydration worse. While you rehydrate, avoid drinking alcohol. How to tell if you are recovering from dehydration You may be recovering from dehydration if: You are urinating more often than before you started rehydrating. Your urine is pale yellow. Your energy level improves. You vomit less frequently. You have diarrhea less frequently. Your appetite improves or returns to normal. You feel less dizzy or less light-headed.   Your skin tone and color start to look more normal. Follow these instructions at home: Take over-the-counter and prescription medicines only as told by your health care provider. Do not take sodium  tablets. Doing this can lead to having too much sodium in your body (hypernatremia). Contact a health care provider if: You continue to have symptoms of mild or moderate dehydration, such as: Thirst. Dry lips. Slightly dry mouth. Dizziness. Dark urine or less urine than normal. Muscle cramps. You continue to vomit or have diarrhea. Get help right away if you: Have symptoms of dehydration that get worse. Have a fever. Have a severe headache. Have been vomiting and the following happens: Your vomiting gets worse or does not go away. Your vomit includes blood or green matter (bile). You cannot eat or drink without vomiting. Have problems with urination or bowel movements, such as: Diarrhea that gets worse or does not go away. Blood in your stool (feces). This may cause stool to look black and tarry. Not urinating, or urinating only a small amount of very dark urine, within 6-8 hours. Have trouble breathing. Have symptoms that get worse with treatment. These symptoms may represent a serious problem that is an emergency. Do not wait to see if the symptoms will go away. Get medical help right away. Call your local emergency services (911 in the [place] Do not drive yourself to the hospital. Summary Rehydration is the replacement of body fluids and minerals (electrolytes) that are lost during dehydration. Follow instructions from your health care provider for rehydration. The kind of fluid and amount you should drink depend on your condition. Slowly increase how much you drink until you have taken the amount recommended by your health care provider. Contact your health care provider if you continue to show signs of mild or moderate dehydration. This information is not intended to replace advice given to you by your health care provider. Make sure you discuss any questions you have with your health care provider. Document Revised: 04/22/2019 Document Reviewed: 03/02/2019 Elsevier Patient  Education  2023 [employer]  

## 2021-09-21 ENCOUNTER — Encounter (HOSPITAL_COMMUNITY): Payer: Self-pay | Admitting: Internal Medicine

## 2021-09-21 ENCOUNTER — Ambulatory Visit (HOSPITAL_COMMUNITY)
Admission: RE | Admit: 2021-09-21 | Discharge: 2021-09-21 | Disposition: A | Discharge status: Home / Self Care | Payer: 59 | Source: Ambulatory Visit | Attending: Internal Medicine | Admitting: Internal Medicine

## 2021-09-21 ENCOUNTER — Ambulatory Visit (HOSPITAL_BASED_OUTPATIENT_CLINIC_OR_DEPARTMENT_OTHER)
Admission: RE | Admit: 2021-09-21 | Discharge: 2021-09-21 | Disposition: A | Discharge status: Home / Self Care | Payer: 59 | Source: Ambulatory Visit | Attending: Internal Medicine | Admitting: Internal Medicine

## 2021-09-21 VITALS — BP 104/64 | HR 65 | Wt 301.4 lb

## 2021-09-21 DIAGNOSIS — R0683 Snoring: Secondary | ICD-10-CM | POA: Diagnosis not present

## 2021-09-21 DIAGNOSIS — I10 Essential (primary) hypertension: Secondary | ICD-10-CM | POA: Diagnosis not present

## 2021-09-21 DIAGNOSIS — Z0189 Encounter for other specified special examinations: Secondary | ICD-10-CM | POA: Diagnosis not present

## 2021-09-21 DIAGNOSIS — C50511 Malignant neoplasm of lower-outer quadrant of right female breast: Secondary | ICD-10-CM

## 2021-09-21 DIAGNOSIS — I517 Cardiomegaly: Secondary | ICD-10-CM | POA: Diagnosis not present

## 2021-09-21 DIAGNOSIS — Z803 Family history of malignant neoplasm of breast: Secondary | ICD-10-CM | POA: Diagnosis not present

## 2021-09-21 DIAGNOSIS — R400 Somnolence: Secondary | ICD-10-CM | POA: Insufficient documentation

## 2021-09-21 DIAGNOSIS — Z923 Personal history of irradiation: Secondary | ICD-10-CM | POA: Insufficient documentation

## 2021-09-21 DIAGNOSIS — Z170 Estrogen receptor positive status [ER+]: Secondary | ICD-10-CM | POA: Diagnosis not present

## 2021-09-21 DIAGNOSIS — E785 Hyperlipidemia, unspecified: Secondary | ICD-10-CM | POA: Insufficient documentation

## 2021-09-21 LAB — ECHOCARDIOGRAM COMPLETE
Area-P 1/2: 3.77 cm2
S' Lateral: 2.6 cm

## 2021-09-21 NOTE — Progress Notes (Signed)
CARDIO-ONCOLOGY CLINIC NOTE  Referring Physician: Dr. Lindi Adie Primary Care: [name], MD Primary Cardiologist: Dr. Gwenlyn Found  HPI:  Pamela [name] is 56 y.o. female with obesity, HTN, HL and right breast cancer  diagnosed referred by Dr. Lindi Adie  for enrollment into the Cardio-Oncology program.   Malignant neoplasm of lower-outer quadrant of right breast of female, estrogen receptor positive ([place])  05/03/2020 Initial Diagnosis    Screening mammogram showed a 1.0cm mass at the 6 o'clock position in the right breast. Diagnostic mammogram and US showed the 1.2cm mass at the 6 o'clock position in the right breast. Biopsy showed IDC, grade 3, HER-2 positive (3+), ER 15% weak, PR-, Ki67 80%.    05/11/2020 Cancer Staging    Staging form: Breast, AJCC 8th Edition - Clinical stage from 05/11/2020: Stage IA (cT1b, cN0, cM0, G3, ER+, PR-, HER2+) - Signed by Nicholas Lose, MD on 05/11/2020 Stage prefix: Initial diagnosis    05/19/2020 Genetic Testing    Negative hereditary cancer genetic testing: no pathogenic variants detected in [facility] Breast STAT Panel and Common Hereditary Cancers Panel.  The report dates are May 19, 2020 (STAT) and May 23, 2020 (Common Hereditary).    The Common Hereditary Cancers Panel offered by [facility] includes sequencing and/or deletion duplication testing of the following 47 genes: APC, ATM, AXIN2, BARD1, BMPR1A, BRCA1, BRCA2, BRIP1, CDH1, CDK4, CDKN2A (p14ARF), CDKN2A (p16INK4a), CHEK2, CTNNA1, DICER1, EPCAM (Deletion/duplication testing only), GREM1 (promoter region deletion/duplication testing only), GREM1, HOXB13, KIT, MEN1, MLH1, MSH2, MSH3, MSH6, MUTYH, NBN, NF1, NHTL1, PALB2, PDGFRA, PMS2, POLD1, POLE, PTEN, RAD50, RAD51C, RAD51D, SDHA, SDHB, SDHC, SDHD, SMAD4, SMARCA4. STK11, TP53, TSC1, TSC2, and VHL.  The following genes were evaluated for sequence changes only: SDHA and HOXB13 c.251G>A variant only.es only: SDHA and HOXB13 c.251G>A variant only.    06/02/2020 Surgery     Right lumpectomy ([facility]): invasive and in situ ductal carcinoma, 1.3cm, clear margins, 1 right axillary lymph node negative for carcinoma.    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 Denies known heart disease.  Diagnosed with HER-2+ breast CA in 3/22. Treated with lumpectomy in 3/22. Then Taxol/Herceptin 12 rounds from 5/22-8/22. Now on Herceptin q 3 weeks. Finished XRT in 10/22.   Finished Herceptin 07/08/21. Found to have recurrent disease in lymph node in her neck. Enhertu started on 08/21/21. Has had 2 treatments so far. Initially struggled with GI SEs but now improved. Tolerating ok. Denies SOB, edema.   Echo today 09/21/21 EF 60-65% Mild to mod LVH G1DD GLS -15.9% (underestimated) - Personally reviewed   Echo 4/23 EF 55% GLS -19.9% Echo1/17/22 Echo 55-60%  Mod LVH G2DD GLS 15.0% Echo 05/20/20: EF 60-65% GLS -21.2% Echo 08/30/20: EF 60-65% GLS - 20.7% Echo 10/22: EF 60-65% GLS reported as -13.3%    Past Medical History:  Diagnosis Date   Anxiety    Asthma    Back pain    Breast cancer (HCC)    Constipation    DUB (dysfunctional uterine bleeding) 2007  Edema of both lower extremities    Elevated cholesterol    Family history of breast cancer 05/12/2020   Family history of lung cancer 05/12/2020   Food allergy    H/O menorrhagia 05/01/2006   High cholesterol    History of ovarian cyst 2007   Hypertension 03/31/2005   Increased BMI 07/11/2006   Joint pain    Migraine    N&V (nausea and vomiting)    Obesity 2007   Obstructive sleep  apnea syndrome, moderate 10/27/2013   no cpap   Sleep apnea    SOB (shortness of breath)    Vitamin D deficiency    Vitamin D deficiency    Weight loss 07/11/2006    Current Outpatient Medications  Medication Sig Dispense Refill   amLODipine (NORVASC) 10 MG tablet Take 1 tablet (10 mg total) by mouth daily. 90 tablet 2   aspirin EC 81 MG tablet Take 1 tablet (81 mg total) by mouth daily. Swallow whole. 30 tablet 11   Azilsartan-Chlorthalidone (EDARBYCLOR) 40-25 MG TABS Take 1 tablet by mouth daily. 90 tablet 2   dexamethasone (DECADRON) 4 MG tablet Take 1 tablet (4 mg total) by mouth 2 (two) times daily with a meal. Day 2,3,4 one tablet with lunch 30 tablet 1   diphenoxylate-atropine (LOMOTIL) 2.5-0.025 MG tablet Take 1 tablet by mouth 4 (four) times daily as needed for diarrhea or loose stools. 30 tablet 0   hydrOXYzine (ATARAX) 25 MG tablet Take 1 tablet by mouth every 12 (twelve) hours as needed.     levocetirizine (XYZAL) 5 MG tablet Take 1 tablet (5 mg total) by mouth every evening. 90 tablet 0   levonorgestrel (MIRENA, 52 MG,) 20 MCG/DAY IUD 1 each by Intrauterine route once.     lidocaine-prilocaine (EMLA) cream Apply 1 application topically daily as needed. 60 g 0   LORazepam (ATIVAN) 0.5 MG tablet Take 1 tablet (0.5 mg total) by mouth every 8 (eight) hours as needed for anxiety. 30 tablet 0   montelukast (SINGULAIR) 10 MG tablet Take 1 tablet (10 mg total) by mouth daily. 90 tablet 0   omeprazole (PRILOSEC) 40 MG capsule Take 40 mg by mouth as needed.     ondansetron (ZOFRAN) 8 MG tablet Take 1 tablet (8 mg total) by mouth 2 (two) times daily as needed for refractory nausea / vomiting. Start on day 3 after chemo. 30 tablet 1   prochlorperazine (COMPAZINE) 10 MG tablet Take 1 tablet (10 mg total) by mouth every 6 (six) hours as needed (Nausea or vomiting). 30 tablet 1   rosuvastatin (CRESTOR) 40 MG tablet Take 1 tablet (40 mg total) by mouth daily. 90 tablet 3   Current  Facility-Administered Medications  Medication Dose Route Frequency Provider Last Rate Last Admin   0.9 %  sodium chloride infusion   Intravenous PRN Ghumman, Ramandeep, NP       Facility-Administered Medications Ordered in Other Encounters  Medication Dose Route Frequency Provider Last Rate Last Admin   heparin lock flush 100 unit/mL  500 Units Intracatheter Once Nicholas Lose, MD       sodium chloride flush (NS) 0.9 % injection 10 mL  10 mL Intracatheter Once Nicholas Lose, MD        Allergies  Allergen Reactions   Pollen Extract Shortness Of Breath   Latex Rash   Codeine Hives   Shellfish Allergy Hives   Betadine [Povidone Iodine] Rash      Social History   Socioeconomic History   Marital  status: Single    Spouse name: Not on file   Number of children: Not on file   Years of education: Not on file   Highest education level: Not on file  Occupational History   Occupation: Education officer, museum  Tobacco Use   Smoking status: Never   Smokeless tobacco: Never  Vaping Use   Vaping Use: Never used  Substance and Sexual Activity   Alcohol use: No   Drug use: Not Currently    Frequency: 2.0 times per week   Sexual activity: Not Currently    Birth control/protection: I.U.D.    Comment: Mirena  Other Topics Concern   Not on file  Social History Narrative   Not on file   Social Determinants of Health   Financial Resource Strain: Not on file  Food Insecurity: Not on file  Transportation Needs: Not on file  Physical Activity: Not on file  Stress: Not on file  Social Connections: Not on file  Intimate Partner Violence: Not on file      Family History  Problem Relation Age of Onset   Hypertension Mother    High Cholesterol Mother    Thyroid disease Mother    Cancer Mother    Sleep apnea Mother    Hypertension Father    Heart attack Father    Sudden death Father    Diabetes Maternal Grandmother    Bone cancer Maternal Grandfather        dx after 69   Breast cancer  Maternal Aunt        dx 52s   Lung cancer Maternal Aunt        dx after 50    Vitals:   09/21/21 1056  BP: 104/64  Pulse: 65  SpO2: 99%  Weight: (!) 136.7 kg (301 lb 6.4 oz)    Wt Readings from Last 3 Encounters:  09/21/21 (!) 136.7 kg (301 lb 6.4 oz)  09/18/21 (!) 139.8 kg (308 lb 3.2 oz)  09/15/21 (!) 138.3 kg (304 lb 14.4 oz)    PHYSICAL EXAM: General:  Well appearing. No resp difficulty HEENT: normal Neck: supple. no JVD. Carotids 2+ bilat; no bruits. No lymphadenopathy or thryomegaly appreciated. Cor: PMI nondisplaced. Regular rate & rhythm. No rubs, gallops or murmurs. Lungs: clear Abdomen: obese soft, nontender, nondistended. No hepatosplenomegaly. No bruits or masses. Good bowel sounds. Extremities: no cyanosis, clubbing, rash, edema Neuro: alert & orientedx3, cranial nerves grossly intact. moves all 4 extremities w/o difficulty. Affect pleasant   ECG NSR 72 No ST-T wave abnormalities. Personally reviewed   ASSESSMENT & PLAN:  1. Right Breast Cancer, stage IV - HER2 + - Treated with lumpectomy in 3/22. Then Taxol/Herceptin 12 rounds from 5/22-8/22. Now on Herceptin q 3 weeks. Also about to finish XRT.  - Echo 10/22 EF 60-65% GLS reported as -13.3%. I have reviewed this and reading is inaccurate due to poor endocardial tracking - Echo  03/21/20 EF 55-60%  Mod LVH G2DD GLS 15.0% - Echo 06/22/21 EF 55% Mild LVH G1DD GLS -19.9% - Finished Herceptin 07/08/21. Found to have recurrent disease in lymph node in her neck. Enhertu started on 08/21/21. Has had 2 treatments so far. Initially struggled with GI SEs but now improved. Tolerating ok. Denies SOB, edema.  - Echo today 09/21/21 EF 60-65% Mild to mod LVH G1DD GLS -15.9% (underestimated) - Personally reviewed - Continue echos every 3 months for now. Can extend period to 4-6 months once stable on Enhertu  2. HTN with LVH on echo  -  Blood pressure well controlled. Continue current regimen. - Home sleep study ordered  previously. Not completed  3. Snoring/daytime somnolence - pending sleep study. Has WatchPat at home. Encouraged to use when she is able   4. Morbid obesity - has enrolled in weight loss clinic   Glori Bickers, MD  11:30 AM

## 2021-09-21 NOTE — Progress Notes (Signed)
  Echocardiogram 2D Echocardiogram has been performed.  Pamela [name] 09/21/2021, 11:08 AM

## 2021-09-21 NOTE — Patient Instructions (Signed)
There has been no changes to your medications.  Your physician has requested that you have an echocardiogram. Echocardiography is a painless test that uses sound waves to create images of your heart. It provides your doctor with information about the size and shape of your heart and how well your heart's chambers and valves are working. This procedure takes approximately one hour. There are no restrictions for this procedure.  Your physician recommends that you schedule a follow-up appointment in: 4 months (November 2023) **please call the office in September to arrange your follow up appointment **  If you have any questions or concerns before your next appointment please send [place] a message through [place] or call our office at [phone].    TO LEAVE A MESSAGE FOR THE NURSE SELECT OPTION 2, PLEASE LEAVE A MESSAGE INCLUDING: YOUR NAME DATE OF BIRTH CALL BACK NUMBER REASON FOR CALL**this is important as we prioritize the call backs  YOU WILL RECEIVE A CALL BACK THE SAME DAY AS LONG AS YOU CALL BEFORE 4:00 PM  At the [facility], you and your health needs are our priority. As part of our continuing mission to provide you with exceptional heart care, we have created designated Provider Care Teams. These Care Teams include your primary Cardiologist (physician) and Advanced Practice Providers (APPs- Physician Assistants and Nurse Practitioners) who all work together to provide you with the care you need, when you need it.   You may see any of the following providers on your designated Care Team at your next follow up: Dr Glori Bickers Dr Haynes Kerns, NP Lyda Jester, [place] [facility] [place], [place] Audry Riles, PharmD   Please be sure to bring in all your medications bottles to every appointment.

## 2021-09-21 NOTE — Addendum Note (Signed)
Encounter addended by: Jerl Mina, RN on: 09/21/2021 11:38 AM  Actions taken: Order list changed, Diagnosis association updated, Clinical Note Signed

## 2021-09-22 ENCOUNTER — Other Ambulatory Visit: Payer: Self-pay | Admitting: *Deleted

## 2021-09-22 ENCOUNTER — Telehealth: Payer: Self-pay | Admitting: Hematology and Oncology

## 2021-09-22 NOTE — Progress Notes (Signed)
Received call from pt requesting appt for IVF Saturday and Monday.  Pt states she continues to feel fatigued and dehydrated and fluids have helped in the past.  Verbal orders received from MD for pt to proceed with IVF.  Orders placed under supportive therapy and high priority message sent to scheduling.

## 2021-09-22 NOTE — Telephone Encounter (Signed)
Scheduled per 07/21 scheduling message, patient has been called and notified of upcoming appointments.

## 2021-09-23 ENCOUNTER — Inpatient Hospital Stay: Payer: 59

## 2021-09-23 VITALS — BP 127/79 | HR 87 | Temp 98.0°F | Resp 18

## 2021-09-23 DIAGNOSIS — Z95828 Presence of other vascular implants and grafts: Secondary | ICD-10-CM

## 2021-09-23 DIAGNOSIS — C50919 Malignant neoplasm of unspecified site of unspecified female breast: Secondary | ICD-10-CM

## 2021-09-23 DIAGNOSIS — Z5112 Encounter for antineoplastic immunotherapy: Secondary | ICD-10-CM | POA: Diagnosis not present

## 2021-09-23 DIAGNOSIS — R110 Nausea: Secondary | ICD-10-CM

## 2021-09-23 DIAGNOSIS — Z170 Estrogen receptor positive status [ER+]: Secondary | ICD-10-CM

## 2021-09-23 MED ORDER — SODIUM CHLORIDE 0.9 % IV SOLN: INTRAVENOUS | Status: DC

## 2021-09-23 MED ORDER — PROCHLORPERAZINE EDISYLATE 10 MG/2ML IJ SOLN
10.0000 mg | Freq: Once | INTRAMUSCULAR | Status: AC
  Administered 2021-09-23: 10 mg via INTRAVENOUS
  Filled 2021-09-23: qty 2

## 2021-09-25 ENCOUNTER — Ambulatory Visit: Payer: 59

## 2021-09-25 ENCOUNTER — Other Ambulatory Visit: Payer: Self-pay

## 2021-09-25 ENCOUNTER — Other Ambulatory Visit: Payer: Self-pay | Admitting: *Deleted

## 2021-09-25 ENCOUNTER — Inpatient Hospital Stay: Payer: 59

## 2021-09-25 NOTE — Progress Notes (Signed)
Received call from pt requesting to cancel IVF for today.  Pt states she feels well hydrated and will contact our office if needed.

## 2021-09-29 NOTE — Progress Notes (Signed)
Patient Care Team: [name], MD as PCP - General (Internal Medicine) Mcarthur Rossetti, MD as Consulting Physician (Orthopedic Surgery) Mauro Kaufmann, RN as Oncology Nurse Navigator Rockwell Germany, RN as Oncology Nurse Navigator [name], MD as Consulting Physician (General Surgery) Nicholas Lose, MD as Consulting Physician (Hematology and Oncology) Eppie Gibson, MD as Attending Physician (Radiation Oncology)  DIAGNOSIS:  Encounter Diagnosis  Name Primary?   Malignant neoplasm of lower-outer quadrant of right breast of female, estrogen receptor positive ([place]) Yes    SUMMARY OF ONCOLOGIC HISTORY: Oncology History  Malignant neoplasm of lower-outer quadrant of right breast of female, estrogen receptor positive ([place])  05/03/2020 Initial Diagnosis   Screening mammogram showed a 1.0cm mass at the 6 o'clock position in the right breast. Diagnostic mammogram and US showed the 1.2cm mass at the 6 o'clock position in the right breast. Biopsy showed IDC, grade 3, HER-2 positive (3+), ER 15% weak, PR-, Ki67 80%.   05/11/2020 Cancer Staging   Staging form: Breast, AJCC 8th Edition - Clinical stage from 05/11/2020: Stage IA (cT1b, cN0, cM0, G3, ER+, PR-, HER2+) - Signed by Nicholas Lose, MD on 05/11/2020 Stage prefix: Initial diagnosis   05/19/2020 Genetic Testing   Negative hereditary cancer genetic testing: no pathogenic variants detected in [facility] Breast STAT Panel and Common Hereditary Cancers Panel.  The report dates are May 19, 2020 (STAT) and May 23, 2020 (Common Hereditary).   The Common Hereditary Cancers Panel offered by [facility] includes sequencing and/or deletion duplication testing of the following 47 genes: APC, ATM, AXIN2, BARD1, BMPR1A, BRCA1, BRCA2, BRIP1, CDH1, CDK4, CDKN2A (p14ARF), CDKN2A (p16INK4a), CHEK2, CTNNA1, DICER1, EPCAM (Deletion/duplication testing only), GREM1 (promoter region deletion/duplication testing only), GREM1, HOXB13, KIT, MEN1, MLH1,  MSH2, MSH3, MSH6, MUTYH, NBN, NF1, NHTL1, PALB2, PDGFRA, PMS2, POLD1, POLE, PTEN, RAD50, RAD51C, RAD51D, SDHA, SDHB, SDHC, SDHD, SMAD4, SMARCA4. STK11, TP53, TSC1, TSC2, and VHL.  The following genes were evaluated for sequence changes only: SDHA and HOXB13 c.251G>A variant only.es only: SDHA and HOXB13 c.251G>A variant only.   06/02/2020 Surgery   Right lumpectomy ([facility]): invasive and in situ ductal carcinoma, 1.3cm, clear margins, 1 right axillary lymph node negative for carcinoma.   06/02/2020 Cancer Staging   Staging form: Breast, AJCC 8th Edition - Pathologic stage from 06/02/2020: Stage IA (pT1c, pN0, cM0, G3, ER+, PR-, HER2+) - Signed by Gardenia Phlegm, NP on 06/15/2020 Stage prefix: Initial diagnosis Histologic grading system: 3 grade system   07/08/2020 - 10/21/2020 Chemotherapy   Taxol Herceptin followed by Herceptin maintenance   11/11/2020 -  Chemotherapy   Herceptin maintenance       04/14/2021 -  Anti-estrogen oral therapy   Tamoxifen 10 mg   08/21/2021 -  Chemotherapy   Patient is on Treatment Plan : BREAST METASTATIC fam-trastuzumab deruxtecan-nxki (Enhertu) q21d       CHIEF COMPLIANT: Cycle 3 Enhertu    INTERVAL HISTORY: Pamela [name] is a  56 y.o. with above-mentioned history of metastatic breast cancer. She reports to the clinic today for cycle 3. She states that it went better than the first one. Still had same symptoms, nausea constipation was better. She doesn't do well with the iv benadryl. She prefer the pill form. She notice that the swelling on the right side of neck has went down.   ALLERGIES:  is allergic to pollen extract, latex, codeine, shellfish allergy, betadine [povidone iodine], and compazine [prochlorperazine].  MEDICATIONS:  Current Outpatient Medications  Medication Sig Dispense Refill   amLODipine (  NORVASC) 10 MG tablet Take 1 tablet (10 mg total) by mouth daily. 90 tablet 2   aspirin EC 81 MG tablet Take 1 tablet (81 mg total) by  mouth daily. Swallow whole. 30 tablet 11   Azilsartan-Chlorthalidone (EDARBYCLOR) 40-25 MG TABS Take 1 tablet by mouth daily. 90 tablet 2   dexamethasone (DECADRON) 4 MG tablet Take 1 tablet (4 mg total) by mouth 2 (two) times daily with a meal. Day 2,3,4 one tablet with lunch 30 tablet 1   diphenoxylate-atropine (LOMOTIL) 2.5-0.025 MG tablet Take 1 tablet by mouth 4 (four) times daily as needed for diarrhea or loose stools. 30 tablet 0   hydrOXYzine (ATARAX) 25 MG tablet Take 1 tablet by mouth every 12 (twelve) hours as needed.     levocetirizine (XYZAL) 5 MG tablet Take 1 tablet (5 mg total) by mouth every evening. 90 tablet 0   levonorgestrel (MIRENA, 52 MG,) 20 MCG/DAY IUD 1 each by Intrauterine route once.     lidocaine-prilocaine (EMLA) cream Apply 1 application topically daily as needed. 60 g 0   LORazepam (ATIVAN) 0.5 MG tablet Take 1 tablet (0.5 mg total) by mouth every 8 (eight) hours as needed for anxiety. 30 tablet 0   montelukast (SINGULAIR) 10 MG tablet Take 1 tablet (10 mg total) by mouth daily. 90 tablet 0   omeprazole (PRILOSEC) 40 MG capsule Take 40 mg by mouth as needed.     ondansetron (ZOFRAN) 8 MG tablet Take 1 tablet (8 mg total) by mouth 2 (two) times daily as needed for refractory nausea / vomiting. Start on day 3 after chemo. 30 tablet 1   prochlorperazine (COMPAZINE) 10 MG tablet Take 1 tablet (10 mg total) by mouth every 6 (six) hours as needed (Nausea or vomiting). 30 tablet 1   rosuvastatin (CRESTOR) 40 MG tablet Take 1 tablet (40 mg total) by mouth daily. 90 tablet 3   Current Facility-Administered Medications  Medication Dose Route Frequency Provider Last Rate Last Admin   0.9 %  sodium chloride infusion   Intravenous PRN Ghumman, Ramandeep, NP       Facility-Administered Medications Ordered in Other Visits  Medication Dose Route Frequency Provider Last Rate Last Admin   heparin lock flush 100 unit/mL  500 Units Intracatheter Once Nicholas Lose, MD       sodium  chloride flush (NS) 0.9 % injection 10 mL  10 mL Intracatheter Once Nicholas Lose, MD        PHYSICAL EXAMINATION: ECOG PERFORMANCE STATUS: 1 - Symptomatic but completely ambulatory  Vitals:   10/06/21 1038  BP: (!) 145/78  Pulse: 66  Resp: 18  Temp: (!) 97.4 F (36.3 C)  SpO2: 100%   Filed Weights   10/06/21 1038  Weight: (!) 302 lb 11.2 oz (137.3 kg)    LABORATORY DATA:  I have reviewed the data as listed    Latest Ref Rng & Units 09/15/2021    9:47 AM 09/01/2021   10:35 AM 08/21/2021    8:53 AM  CMP  Glucose 70 - 99 mg/dL 115  132  108   BUN 6 - 20 mg/dL 14  13  14    Creatinine 0.44 - 1.00 mg/dL 0.81  0.97  0.80   Sodium 135 - 145 mmol/L 142  138  140   Potassium 3.5 - 5.1 mmol/L 3.4  3.3  3.3   Chloride 98 - 111 mmol/L 107  101  103   CO2 22 - 32 mmol/L 30  28  30  Calcium 8.9 - 10.3 mg/dL 9.2  9.3  9.3   Total Protein 6.5 - 8.1 g/dL 6.6  6.7  6.9   Total Bilirubin 0.3 - 1.2 mg/dL 0.5  1.0  0.8   Alkaline Phos 38 - 126 U/L 54  62  58   AST 15 - 41 U/L 13  24  17    ALT 0 - 44 U/L 12  26  13      Lab Results  Component Value Date   WBC 5.3 10/06/2021   HGB 10.3 (L) 10/06/2021   HCT 31.2 (L) 10/06/2021   MCV 82.3 10/06/2021   PLT 255 10/06/2021   NEUTROABS 3.3 10/06/2021    ASSESSMENT & PLAN:  Malignant neoplasm of lower-outer quadrant of right breast of female, estrogen receptor positive ([place]) 05/03/2020:Screening mammogram showed a 1.0cm mass at the 6 o'clock position in the right breast. Diagnostic mammogram and US showed the 1.2cm mass at the 6 o'clock position in the right breast. Biopsy showed IDC, grade 3, HER-2 positive (3+), ER 15% weak, PR-, Ki67 80%.   06/02/2020:Right lumpectomy ([facility]): invasive and in situ ductal carcinoma, 1.3cm, clear margins, 1 right axillary lymph node negative for carcinoma. ER 15% weak, PR-,HER-2 positive (3+) Ki67 80%.   Treatment plan: 1.  Adjuvant chemotherapy with Taxol Herceptin followed by Herceptin maintenance.   Stopped after 11 cycles of Taxol Herceptin maintenance completed 07/07/2021  2.  Adjuvant radiation therapy 11/23/2020-12/19/2020 3.  Adjuvant antiestrogen therapy with tamoxifen started October 2022 4.  CT angiogram 07/13/2021: Right supraclavicular lymph node 5 cm, right internal mammary lymph nodes 1.8 cm 5.  Metastatic breast cancer: June 2023:Right supraclavicular lymphadenopathy: Biopsy: Metastatic poorly differentiated adenocarcinoma, ER +40%, PR negative, HER2 positive 3+ PET CT scan 08/06/21: Ext Right Supra clav LN along with Internal mammary and Rt Upper Mediastinal and Rt Axillary LN -----------------------------------------------------------------------------------------------------  Current treatment: Enhertu started on 08/21/2021, today cycle 3 Enhertu toxicities: 1,.  Severe nausea, requiring IV fluids 2. severe diarrhea lasting 3 to 4 days requiring Lomotil and Imodium 3.  Severe fatigue 4.  Chemotherapy-induced anemia: Monitoring closely   We will reduce the dose of Enhertu with cycle 2.  Plan to obtain CT scans after this cycle of chemotherapy before cycle 4.    Orders Placed This Encounter  Procedures   CT CHEST ABDOMEN PELVIS W CONTRAST    Standing Status:   Future    Standing Expiration Date:   10/07/2022    Order Specific Question:   Is patient pregnant?    Answer:   No    Order Specific Question:   Preferred imaging location?    Answer:   [facility]    Order Specific Question:   Is Oral Contrast requested for this exam?    Answer:   Yes, Per Radiology protocol   The patient has a good understanding of the overall plan. she agrees with it. she will call with any problems that may develop before the next visit here. Total time spent: 30 mins including face to face time and time spent for planning, charting and co-ordination of care   Harriette Ohara, MD 10/06/21    I Gardiner Coins am scribing for Dr. Lindi Adie  I have reviewed the above documentation for  accuracy and completeness, and I agree with the above.

## 2021-10-02 ENCOUNTER — Inpatient Hospital Stay: Payer: 59

## 2021-10-02 ENCOUNTER — Inpatient Hospital Stay: Payer: 59 | Admitting: Hematology and Oncology

## 2021-10-03 ENCOUNTER — Telehealth: Payer: Self-pay | Admitting: Hematology and Oncology

## 2021-10-03 ENCOUNTER — Other Ambulatory Visit: Payer: Self-pay

## 2021-10-03 NOTE — Telephone Encounter (Signed)
Called patient as a FU from calling about her scheduled appointments on 7/21. When schedulers first tried to make contact with the patient, the number did not go through. Was able to make contact with the patient today and she is aware of the appointments that were made for her.

## 2021-10-05 ENCOUNTER — Telehealth: Payer: Self-pay | Admitting: Hematology and Oncology

## 2021-10-05 MED FILL — Dexamethasone Sodium Phosphate Inj 100 MG/10ML: INTRAMUSCULAR | Qty: 1 | Status: AC

## 2021-10-05 MED FILL — Fosaprepitant Dimeglumine For IV Infusion 150 MG (Base Eq): INTRAVENOUS | Qty: 5 | Status: AC

## 2021-10-05 NOTE — Telephone Encounter (Signed)
Rescheduled appointment per provider PAL. Patient is aware of the changes made to her upcoming appointment. 

## 2021-10-05 NOTE — Assessment & Plan Note (Addendum)
05/03/2020:Screening mammogram showed a 1.0cm mass at the 6 o'clock position in the right breast. Diagnostic mammogram and US showed the 1.2cm mass at the 6 o'clock position in the right breast. Biopsy showed IDC, grade 3, HER-2 positive (3+), ER 15% weak, PR-, Ki67 80%.  06/02/2020:Right lumpectomy ([facility]): invasive and in situ ductal carcinoma, 1.3cm, clear margins, 1 right axillary lymph node negative for carcinoma. ER 15% weak, PR-,HER-2 positive (3+) Ki67 80%.  Treatment plan: 1.Adjuvant chemotherapy with Taxol Herceptinfollowed byHerceptin maintenance.Stopped after 11 cycles of TaxolHerceptin maintenance completed 07/07/2021 2.Adjuvant radiation therapy9/21/2022-12/19/2020 3.Adjuvant antiestrogen therapywith tamoxifen started October 2022 4.CT angiogram 07/13/2021: Right supraclavicular lymph node 5 cm, right internal mammary lymph nodes 1.8 cm 5.  Metastatic breast cancer: June 2023:Right supraclavicular lymphadenopathy:Biopsy: Metastatic poorly differentiated adenocarcinoma, ER +40%, PR negative, HER2 positive 3+ PET CT scan6/4/23: Ext Right Supra clav LN along with Internal mammary and Rt Upper Mediastinal and Rt Axillary LN -----------------------------------------------------------------------------------------------------  Current treatment: Enhertu started on 08/21/2021, today cycle 3 Enhertu toxicities: 1,.  Severe nausea, requiring IV fluids 2. severe diarrhea lasting 3 to 4 days requiring Lomotil and Imodium 3.  Severe fatigue 4.  Chemotherapy-induced anemia: Monitoring closely  We will reduce the dose of Enhertu with cycle 2.  Plan to obtain CT scans after this cycle of chemotherapy before cycle 4.

## 2021-10-06 ENCOUNTER — Other Ambulatory Visit: Payer: Self-pay | Admitting: *Deleted

## 2021-10-06 ENCOUNTER — Other Ambulatory Visit: Payer: 59

## 2021-10-06 ENCOUNTER — Inpatient Hospital Stay: Payer: 59

## 2021-10-06 ENCOUNTER — Other Ambulatory Visit: Payer: Self-pay

## 2021-10-06 ENCOUNTER — Inpatient Hospital Stay: Payer: 59 | Attending: Hematology and Oncology

## 2021-10-06 ENCOUNTER — Inpatient Hospital Stay (HOSPITAL_BASED_OUTPATIENT_CLINIC_OR_DEPARTMENT_OTHER): Payer: 59 | Admitting: Hematology and Oncology

## 2021-10-06 VITALS — BP 145/78 | HR 66 | Temp 97.4°F | Resp 18 | Ht 69.0 in | Wt 302.7 lb

## 2021-10-06 DIAGNOSIS — R112 Nausea with vomiting, unspecified: Secondary | ICD-10-CM | POA: Insufficient documentation

## 2021-10-06 DIAGNOSIS — C50511 Malignant neoplasm of lower-outer quadrant of right female breast: Secondary | ICD-10-CM

## 2021-10-06 DIAGNOSIS — R5383 Other fatigue: Secondary | ICD-10-CM | POA: Insufficient documentation

## 2021-10-06 DIAGNOSIS — R110 Nausea: Secondary | ICD-10-CM | POA: Diagnosis not present

## 2021-10-06 DIAGNOSIS — N6315 Unspecified lump in the right breast, overlapping quadrants: Secondary | ICD-10-CM | POA: Insufficient documentation

## 2021-10-06 DIAGNOSIS — Z79899 Other long term (current) drug therapy: Secondary | ICD-10-CM | POA: Diagnosis not present

## 2021-10-06 DIAGNOSIS — Z95828 Presence of other vascular implants and grafts: Secondary | ICD-10-CM

## 2021-10-06 DIAGNOSIS — T451X5A Adverse effect of antineoplastic and immunosuppressive drugs, initial encounter: Secondary | ICD-10-CM | POA: Diagnosis not present

## 2021-10-06 DIAGNOSIS — E876 Hypokalemia: Secondary | ICD-10-CM

## 2021-10-06 DIAGNOSIS — Z170 Estrogen receptor positive status [ER+]: Secondary | ICD-10-CM

## 2021-10-06 DIAGNOSIS — K5900 Constipation, unspecified: Secondary | ICD-10-CM | POA: Insufficient documentation

## 2021-10-06 DIAGNOSIS — C50919 Malignant neoplasm of unspecified site of unspecified female breast: Secondary | ICD-10-CM

## 2021-10-06 DIAGNOSIS — Z885 Allergy status to narcotic agent status: Secondary | ICD-10-CM | POA: Insufficient documentation

## 2021-10-06 DIAGNOSIS — Z793 Long term (current) use of hormonal contraceptives: Secondary | ICD-10-CM | POA: Insufficient documentation

## 2021-10-06 DIAGNOSIS — Z888 Allergy status to other drugs, medicaments and biological substances status: Secondary | ICD-10-CM | POA: Diagnosis not present

## 2021-10-06 DIAGNOSIS — D6481 Anemia due to antineoplastic chemotherapy: Secondary | ICD-10-CM | POA: Insufficient documentation

## 2021-10-06 DIAGNOSIS — R197 Diarrhea, unspecified: Secondary | ICD-10-CM | POA: Insufficient documentation

## 2021-10-06 DIAGNOSIS — Z5112 Encounter for antineoplastic immunotherapy: Secondary | ICD-10-CM | POA: Diagnosis present

## 2021-10-06 LAB — CBC WITH DIFFERENTIAL (CANCER CENTER ONLY)
Abs Immature Granulocytes: 0.05 10*3/uL (ref 0.00–0.07)
Basophils Absolute: 0 10*3/uL (ref 0.0–0.1)
Basophils Relative: 0 %
Eosinophils Absolute: 0.2 10*3/uL (ref 0.0–0.5)
Eosinophils Relative: 3 %
HCT: 31.2 % — ABNORMAL LOW (ref 36.0–46.0)
Hemoglobin: 10.3 g/dL — ABNORMAL LOW (ref 12.0–15.0)
Immature Granulocytes: 1 %
Lymphocytes Relative: 22 %
Lymphs Abs: 1.2 10*3/uL (ref 0.7–4.0)
MCH: 27.2 pg (ref 26.0–34.0)
MCHC: 33 g/dL (ref 30.0–36.0)
MCV: 82.3 fL (ref 80.0–100.0)
Monocytes Absolute: 0.6 10*3/uL (ref 0.1–1.0)
Monocytes Relative: 12 %
Neutro Abs: 3.3 10*3/uL (ref 1.7–7.7)
Neutrophils Relative %: 62 %
Platelet Count: 255 10*3/uL (ref 150–400)
RBC: 3.79 MIL/uL — ABNORMAL LOW (ref 3.87–5.11)
RDW: 16.9 % — ABNORMAL HIGH (ref 11.5–15.5)
WBC Count: 5.3 10*3/uL (ref 4.0–10.5)
nRBC: 0.6 % — ABNORMAL HIGH (ref 0.0–0.2)

## 2021-10-06 LAB — CMP (CANCER CENTER ONLY)
ALT: 16 U/L (ref 0–44)
AST: 17 U/L (ref 15–41)
Albumin: 3.7 g/dL (ref 3.5–5.0)
Alkaline Phosphatase: 57 U/L (ref 38–126)
Anion gap: 7 (ref 5–15)
BUN: 6 mg/dL (ref 6–20)
CO2: 34 mmol/L — ABNORMAL HIGH (ref 22–32)
Calcium: 8 mg/dL — ABNORMAL LOW (ref 8.9–10.3)
Chloride: 103 mmol/L (ref 98–111)
Creatinine: 0.78 mg/dL (ref 0.44–1.00)
GFR, Estimated: >60 mL/min (ref >60)
Glucose, Bld: 111 mg/dL — ABNORMAL HIGH (ref 70–99)
Potassium: 2.6 mmol/L — CL (ref 3.5–5.1)
Sodium: 144 mmol/L (ref 135–145)
Total Bilirubin: 0.7 mg/dL (ref 0.3–1.2)
Total Protein: 6.5 g/dL (ref 6.5–8.1)

## 2021-10-06 LAB — VITAMIN B12: Vitamin B-12: 916 pg/mL — ABNORMAL HIGH (ref 180–914)

## 2021-10-06 MED ORDER — POTASSIUM CHLORIDE CRYS ER 20 MEQ PO TBCR
40.0000 meq | EXTENDED_RELEASE_TABLET | Freq: Once | ORAL | Status: AC
  Administered 2021-10-06: 40 meq via ORAL
  Filled 2021-10-06: qty 2

## 2021-10-06 MED ORDER — PALONOSETRON HCL INJECTION 0.25 MG/5ML
0.2500 mg | Freq: Once | INTRAVENOUS | Status: AC
  Administered 2021-10-06: 0.25 mg via INTRAVENOUS
  Filled 2021-10-06: qty 5

## 2021-10-06 MED ORDER — SODIUM CHLORIDE 0.9% FLUSH
10.0000 mL | Freq: Once | INTRAVENOUS | Status: AC
  Administered 2021-10-06: 10 mL

## 2021-10-06 MED ORDER — POTASSIUM CHLORIDE CRYS ER 20 MEQ PO TBCR: 20.0000 meq | EXTENDED_RELEASE_TABLET | Freq: Every day | ORAL | 0 refills | Status: DC

## 2021-10-06 MED ORDER — POTASSIUM CHLORIDE IN NACL 20-0.9 MEQ/L-% IV SOLN: Freq: Once | INTRAVENOUS | Status: DC

## 2021-10-06 MED ORDER — DEXTROSE 5 % IV SOLN: Freq: Once | INTRAVENOUS | Status: AC

## 2021-10-06 MED ORDER — SODIUM CHLORIDE 0.9 % IV SOLN
10.0000 mg | Freq: Once | INTRAVENOUS | Status: AC
  Administered 2021-10-06: 10 mg via INTRAVENOUS
  Filled 2021-10-06: qty 10

## 2021-10-06 MED ORDER — ACETAMINOPHEN 325 MG PO TABS
650.0000 mg | ORAL_TABLET | Freq: Once | ORAL | Status: AC
  Administered 2021-10-06: 650 mg via ORAL
  Filled 2021-10-06: qty 2

## 2021-10-06 MED ORDER — SODIUM CHLORIDE 0.9% FLUSH
10.0000 mL | INTRAVENOUS | Status: DC | PRN
  Administered 2021-10-06: 10 mL

## 2021-10-06 MED ORDER — FAM-TRASTUZUMAB DERUXTECAN-NXKI CHEMO 100 MG IV SOLR
4.3000 mg/kg | Freq: Once | INTRAVENOUS | Status: AC
  Administered 2021-10-06: 600 mg via INTRAVENOUS
  Filled 2021-10-06: qty 30

## 2021-10-06 MED ORDER — SODIUM CHLORIDE 0.9 % IV SOLN
150.0000 mg | Freq: Once | INTRAVENOUS | Status: AC
  Administered 2021-10-06: 150 mg via INTRAVENOUS
  Filled 2021-10-06: qty 150

## 2021-10-06 MED ORDER — HEPARIN SOD (PORK) LOCK FLUSH 100 UNIT/ML IV SOLN
500.0000 [IU] | Freq: Once | INTRAVENOUS | Status: AC | PRN
  Administered 2021-10-06: 500 [IU]

## 2021-10-06 NOTE — Addendum Note (Signed)
Addended by: Dicie Beam D on: 10/06/2021 11:59 AM   Modules accepted: Orders

## 2021-10-06 NOTE — Addendum Note (Signed)
Addended by: Dicie Beam D on: 10/06/2021 11:57 AM   Modules accepted: Orders

## 2021-10-06 NOTE — Patient Instructions (Signed)
[facility]  Discharge Instructions: Thank you for choosing [facility] to provide your oncology and hematology care.   If you have a lab appointment with the [facility], please go directly to the [facility] and check in at the registration area.   Wear comfortable clothing and clothing appropriate for easy access to any Portacath or PICC line.   We strive to give you quality time with your provider. You may need to reschedule your appointment if you arrive late (15 or more minutes).  Arriving late affects you and other patients whose appointments are after yours.  Also, if you miss three or more appointments without notifying the office, you may be dismissed from the clinic at the provider's discretion.      For prescription refill requests, have your pharmacy contact our office and allow 72 hours for refills to be completed.    Today you received the following chemotherapy and/or immunotherapy agent: Enhertu      To help prevent nausea and vomiting after your treatment, we encourage you to take your nausea medication as directed.  BELOW ARE SYMPTOMS THAT SHOULD BE REPORTED IMMEDIATELY: *FEVER GREATER THAN 100.4 F (38 C) OR HIGHER *CHILLS OR SWEATING *NAUSEA AND VOMITING THAT IS NOT CONTROLLED WITH YOUR NAUSEA MEDICATION *UNUSUAL SHORTNESS OF BREATH *UNUSUAL BRUISING OR BLEEDING *URINARY PROBLEMS (pain or burning when urinating, or frequent urination) *BOWEL PROBLEMS (unusual diarrhea, constipation, pain near the anus) TENDERNESS IN MOUTH AND THROAT WITH OR WITHOUT PRESENCE OF ULCERS (sore throat, sores in mouth, or a toothache) UNUSUAL RASH, SWELLING OR PAIN  UNUSUAL VAGINAL DISCHARGE OR ITCHING   Items with * indicate a potential emergency and should be followed up as soon as possible or go to the Emergency Department if any problems should occur.  Please show the CHEMOTHERAPY ALERT CARD or IMMUNOTHERAPY ALERT CARD at check-in to the  Emergency Department and triage nurse.  Should you have questions after your visit or need to cancel or reschedule your appointment, please contact [facility]  Dept: [phone]  and follow the prompts.  Office hours are 8:00 a.m. to 4:30 p.m. Monday - Friday. Please note that voicemails left after 4:00 p.m. may not be returned until the following business day.  We are closed weekends and major holidays. You have access to a nurse at all times for urgent questions. Please call the main number to the clinic Dept: [phone] and follow the prompts.   For any non-urgent questions, you may also contact your provider using MyChart. We now offer e-Visits for anyone 26 and older to request care online for non-urgent symptoms. For details visit [email or web address].   Also download the MyChart app! Go to the app store, search "MyChart", open the app, select [facility], and log in with your MyChart username and password.  Masks are optional in the cancer centers. If you would like for your care team to wear a mask while they are taking care of you, please let them know. You may have one support person who is at least 55 years old accompany you for your appointments.

## 2021-10-06 NOTE — Addendum Note (Signed)
Addended by: Myrtie Hawk on: 10/06/2021 11:46 AM   Modules accepted: Orders

## 2021-10-06 NOTE — Progress Notes (Addendum)
CRITICAL VALUE STICKER  CRITICAL VALUE: K 2.6  RECEIVER (on-site recipient of call): Merleen Nicely, [place] NOTIFIED: 10/06/21 at 61  MD NOTIFIED: Nicholas Lose, MD  TIME OF NOTIFICATION:10/06/21 at 1112  RESPONSE:  Per MD okay to tx today. pt needing to receive 40 mEq po potassium today with tx.  Verbal orders also received for pt to receive 20 mEq IV potassium with 1L NS tomorrow and start oral potassium 20 mEq daily at home. Orders placed, pt notified and verbalized understanding.

## 2021-10-07 ENCOUNTER — Inpatient Hospital Stay: Payer: 59

## 2021-10-07 VITALS — BP 151/68 | HR 67 | Temp 97.8°F | Resp 18

## 2021-10-07 DIAGNOSIS — R110 Nausea: Secondary | ICD-10-CM

## 2021-10-07 DIAGNOSIS — Z5112 Encounter for antineoplastic immunotherapy: Secondary | ICD-10-CM | POA: Diagnosis not present

## 2021-10-07 DIAGNOSIS — C50919 Malignant neoplasm of unspecified site of unspecified female breast: Secondary | ICD-10-CM

## 2021-10-07 DIAGNOSIS — Z170 Estrogen receptor positive status [ER+]: Secondary | ICD-10-CM

## 2021-10-07 DIAGNOSIS — E876 Hypokalemia: Secondary | ICD-10-CM

## 2021-10-07 DIAGNOSIS — Z95828 Presence of other vascular implants and grafts: Secondary | ICD-10-CM

## 2021-10-07 MED ORDER — POTASSIUM CHLORIDE IN NACL 20-0.9 MEQ/L-% IV SOLN
Freq: Once | INTRAVENOUS | Status: AC
  Filled 2021-10-07: qty 1000

## 2021-10-07 MED ORDER — HEPARIN SOD (PORK) LOCK FLUSH 100 UNIT/ML IV SOLN
500.0000 [IU] | Freq: Once | INTRAVENOUS | Status: AC
  Administered 2021-10-07: 500 [IU]

## 2021-10-07 MED ORDER — SODIUM CHLORIDE 0.9% FLUSH
10.0000 mL | Freq: Once | INTRAVENOUS | Status: AC
  Administered 2021-10-07: 10 mL

## 2021-10-07 NOTE — Progress Notes (Signed)
Pt tolerated K+ IVF well. Pt did mention mild  headache 2/10, when starting K+ supplement po at night. Advised to take tylenol for headache and if continues to call office. Pt verbalized understanding.

## 2021-10-09 ENCOUNTER — Inpatient Hospital Stay: Payer: 59

## 2021-10-11 ENCOUNTER — Encounter (INDEPENDENT_AMBULATORY_CARE_PROVIDER_SITE_OTHER): Payer: Self-pay

## 2021-10-13 ENCOUNTER — Telehealth: Payer: Self-pay | Admitting: Hematology and Oncology

## 2021-10-13 NOTE — Telephone Encounter (Signed)
Scheduled per 8/11 in basket, pt has been called and confirmed  

## 2021-10-14 ENCOUNTER — Other Ambulatory Visit: Payer: Self-pay

## 2021-10-14 ENCOUNTER — Inpatient Hospital Stay: Payer: 59

## 2021-10-14 VITALS — BP 107/68 | HR 83 | Temp 97.8°F | Resp 16

## 2021-10-14 DIAGNOSIS — Z5112 Encounter for antineoplastic immunotherapy: Secondary | ICD-10-CM | POA: Diagnosis not present

## 2021-10-14 DIAGNOSIS — Z170 Estrogen receptor positive status [ER+]: Secondary | ICD-10-CM

## 2021-10-14 DIAGNOSIS — C50919 Malignant neoplasm of unspecified site of unspecified female breast: Secondary | ICD-10-CM

## 2021-10-14 DIAGNOSIS — R110 Nausea: Secondary | ICD-10-CM

## 2021-10-14 DIAGNOSIS — Z95828 Presence of other vascular implants and grafts: Secondary | ICD-10-CM

## 2021-10-14 MED ORDER — SODIUM CHLORIDE 0.9 % IV SOLN
10.0000 mg | Freq: Once | INTRAVENOUS | Status: DC
  Filled 2021-10-14: qty 1

## 2021-10-14 MED ORDER — SODIUM CHLORIDE 0.9 % IV SOLN: INTRAVENOUS | Status: DC

## 2021-10-14 MED ORDER — HEPARIN SOD (PORK) LOCK FLUSH 100 UNIT/ML IV SOLN
500.0000 [IU] | Freq: Once | INTRAVENOUS | Status: AC
  Administered 2021-10-14: 500 [IU]

## 2021-10-14 MED ORDER — SODIUM CHLORIDE 0.9% FLUSH
10.0000 mL | Freq: Once | INTRAVENOUS | Status: AC
  Administered 2021-10-14: 10 mL

## 2021-10-14 MED ORDER — CYANOCOBALAMIN 1000 MCG/ML IJ SOLN: 1000.0000 ug | Freq: Once | INTRAMUSCULAR | Status: DC

## 2021-10-14 NOTE — Patient Instructions (Signed)
Rehydration, Adult Rehydration is the replacement of body fluids, salts, and minerals (electrolytes) that are lost during dehydration. Dehydration is when there is not enough water or other fluids in the body. This happens when you lose more fluids than you take in. Common causes of dehydration include: Not drinking enough fluids. This can occur when you are ill or doing activities that require a lot of energy, especially in hot weather. Conditions that cause loss of water or other fluids, such as diarrhea, vomiting, sweating, or urinating a lot. Other illnesses, such as fever or infection. Certain medicines, such as those that remove excess fluid from the body (diuretics). Symptoms of mild or moderate dehydration may include thirst, dry lips and mouth, and dizziness. Symptoms of severe dehydration may include increased heart rate, confusion, fainting, and not urinating. For severe dehydration, you may need to get fluids through an IV at the hospital. For mild or moderate dehydration, you can usually rehydrate at home by drinking certain fluids as told by your health care provider. What are the risks? Generally, rehydration is safe. However, taking in too much fluid (overhydration) can be a problem. This is rare. Overhydration can cause an electrolyte imbalance, kidney failure, or a decrease in salt (sodium) levels in the body. Supplies needed You will need an oral rehydration solution (ORS) if your health care provider tells you to use one. This is a drink to treat dehydration. It can be found in pharmacies and retail stores. How to rehydrate Fluids Follow instructions from your health care provider for rehydration. The kind of fluid and the amount you should drink depend on your condition. In general, you should choose drinks that you prefer. If told by your health care provider, drink an ORS. Make an ORS by following instructions on the package. Start by drinking small amounts, about  cup (120  mL) every 5-10 minutes. Slowly increase how much you drink until you have taken the amount recommended by your health care provider. Drink enough clear fluids to keep your urine pale yellow. If you were told to drink an ORS, finish it first, then start slowly drinking other clear fluids. Drink fluids such as: Water. This includes sparkling water and flavored water. Drinking only water can lead to having too little sodium in your body (hyponatremia). Follow the advice of your health care provider. Water from ice chips you suck on. Fruit juice with water you add to it (diluted). Sports drinks. Hot or cold herbal teas. Broth-based soups. Milk or milk products. Food Follow instructions from your health care provider about what to eat while you rehydrate. Your health care provider may recommend that you slowly begin eating regular foods in small amounts. Eat foods that contain a healthy balance of electrolytes, such as bananas, oranges, potatoes, tomatoes, and spinach. Avoid foods that are greasy or contain a lot of sugar. In some cases, you may get nutrition through a feeding tube that is passed through your nose and into your stomach (nasogastric tube, or NG tube). This may be done if you have uncontrolled vomiting or diarrhea. Beverages to avoid  Certain beverages may make dehydration worse. While you rehydrate, avoid drinking alcohol. How to tell if you are recovering from dehydration You may be recovering from dehydration if: You are urinating more often than before you started rehydrating. Your urine is pale yellow. Your energy level improves. You vomit less frequently. You have diarrhea less frequently. Your appetite improves or returns to normal. You feel less dizzy or less light-headed.   Your skin tone and color start to look more normal. Follow these instructions at home: Take over-the-counter and prescription medicines only as told by your health care provider. Do not take sodium  tablets. Doing this can lead to having too much sodium in your body (hypernatremia). Contact a health care provider if: You continue to have symptoms of mild or moderate dehydration, such as: Thirst. Dry lips. Slightly dry mouth. Dizziness. Dark urine or less urine than normal. Muscle cramps. You continue to vomit or have diarrhea. Get help right away if you: Have symptoms of dehydration that get worse. Have a fever. Have a severe headache. Have been vomiting and the following happens: Your vomiting gets worse or does not go away. Your vomit includes blood or green matter (bile). You cannot eat or drink without vomiting. Have problems with urination or bowel movements, such as: Diarrhea that gets worse or does not go away. Blood in your stool (feces). This may cause stool to look black and tarry. Not urinating, or urinating only a small amount of very dark urine, within 6-8 hours. Have trouble breathing. Have symptoms that get worse with treatment. These symptoms may represent a serious problem that is an emergency. Do not wait to see if the symptoms will go away. Get medical help right away. Call your local emergency services (911 in the [place] Do not drive yourself to the hospital. Summary Rehydration is the replacement of body fluids and minerals (electrolytes) that are lost during dehydration. Follow instructions from your health care provider for rehydration. The kind of fluid and amount you should drink depend on your condition. Slowly increase how much you drink until you have taken the amount recommended by your health care provider. Contact your health care provider if you continue to show signs of mild or moderate dehydration. This information is not intended to replace advice given to you by your health care provider. Make sure you discuss any questions you have with your health care provider. Document Revised: 04/22/2019 Document Reviewed: 03/02/2019 Elsevier Patient  Education  2023 [employer]  

## 2021-10-16 ENCOUNTER — Inpatient Hospital Stay: Payer: 59

## 2021-10-16 ENCOUNTER — Other Ambulatory Visit: Payer: Self-pay | Admitting: *Deleted

## 2021-10-16 ENCOUNTER — Other Ambulatory Visit: Payer: Self-pay

## 2021-10-16 ENCOUNTER — Inpatient Hospital Stay (HOSPITAL_BASED_OUTPATIENT_CLINIC_OR_DEPARTMENT_OTHER): Payer: 59 | Admitting: Adult Health

## 2021-10-16 VITALS — BP 136/95 | HR 106 | Temp 97.8°F | Resp 18 | Wt 300.5 lb

## 2021-10-16 DIAGNOSIS — Z95828 Presence of other vascular implants and grafts: Secondary | ICD-10-CM

## 2021-10-16 DIAGNOSIS — C50919 Malignant neoplasm of unspecified site of unspecified female breast: Secondary | ICD-10-CM

## 2021-10-16 DIAGNOSIS — C50511 Malignant neoplasm of lower-outer quadrant of right female breast: Secondary | ICD-10-CM

## 2021-10-16 DIAGNOSIS — R110 Nausea: Secondary | ICD-10-CM

## 2021-10-16 DIAGNOSIS — Z170 Estrogen receptor positive status [ER+]: Secondary | ICD-10-CM | POA: Diagnosis not present

## 2021-10-16 DIAGNOSIS — Z5112 Encounter for antineoplastic immunotherapy: Secondary | ICD-10-CM | POA: Diagnosis not present

## 2021-10-16 DIAGNOSIS — E876 Hypokalemia: Secondary | ICD-10-CM

## 2021-10-16 LAB — SAMPLE TO BLOOD BANK

## 2021-10-16 LAB — CMP (CANCER CENTER ONLY)
ALT: 19 U/L (ref 0–44)
AST: 23 U/L (ref 15–41)
Albumin: 3.8 g/dL (ref 3.5–5.0)
Alkaline Phosphatase: 77 U/L (ref 38–126)
Anion gap: 5 (ref 5–15)
BUN: 8 mg/dL (ref 6–20)
CO2: 32 mmol/L (ref 22–32)
Calcium: 8.9 mg/dL (ref 8.9–10.3)
Chloride: 100 mmol/L (ref 98–111)
Creatinine: 0.89 mg/dL (ref 0.44–1.00)
GFR, Estimated: >60 mL/min (ref >60)
Glucose, Bld: 132 mg/dL — ABNORMAL HIGH (ref 70–99)
Potassium: 3.3 mmol/L — ABNORMAL LOW (ref 3.5–5.1)
Sodium: 137 mmol/L (ref 135–145)
Total Bilirubin: 0.5 mg/dL (ref 0.3–1.2)
Total Protein: 6.9 g/dL (ref 6.5–8.1)

## 2021-10-16 LAB — CBC WITH DIFFERENTIAL (CANCER CENTER ONLY)
Abs Immature Granulocytes: 0.02 10*3/uL (ref 0.00–0.07)
Basophils Absolute: 0 10*3/uL (ref 0.0–0.1)
Basophils Relative: 1 %
Eosinophils Absolute: 0.1 10*3/uL (ref 0.0–0.5)
Eosinophils Relative: 3 %
HCT: 31.8 % — ABNORMAL LOW (ref 36.0–46.0)
Hemoglobin: 10.7 g/dL — ABNORMAL LOW (ref 12.0–15.0)
Immature Granulocytes: 0 %
Lymphocytes Relative: 30 %
Lymphs Abs: 1.4 10*3/uL (ref 0.7–4.0)
MCH: 27.5 pg (ref 26.0–34.0)
MCHC: 33.6 g/dL (ref 30.0–36.0)
MCV: 81.7 fL (ref 80.0–100.0)
Monocytes Absolute: 0.4 10*3/uL (ref 0.1–1.0)
Monocytes Relative: 9 %
Neutro Abs: 2.6 10*3/uL (ref 1.7–7.7)
Neutrophils Relative %: 57 %
Platelet Count: 232 10*3/uL (ref 150–400)
RBC: 3.89 MIL/uL (ref 3.87–5.11)
RDW: 16.1 % — ABNORMAL HIGH (ref 11.5–15.5)
WBC Count: 4.6 10*3/uL (ref 4.0–10.5)
nRBC: 0.4 % — ABNORMAL HIGH (ref 0.0–0.2)

## 2021-10-16 LAB — IRON AND IRON BINDING CAPACITY (CC-WL,HP ONLY)
Iron: 74 ug/dL (ref 28–170)
Saturation Ratios: 21 % (ref 10.4–31.8)
TIBC: 351 ug/dL (ref 250–450)
UIBC: 277 ug/dL (ref 148–442)

## 2021-10-16 LAB — ABO/RH: ABO/RH(D): B POS

## 2021-10-16 LAB — MAGNESIUM: Magnesium: 1.5 mg/dL — ABNORMAL LOW (ref 1.7–2.4)

## 2021-10-16 MED ORDER — POTASSIUM CHLORIDE 10 MEQ/100ML IV SOLN
INTRAVENOUS | Status: AC
  Filled 2021-10-16: qty 100

## 2021-10-16 MED ORDER — SODIUM CHLORIDE 0.9 % IV SOLN
10.0000 mg | Freq: Once | INTRAVENOUS | Status: AC
  Administered 2021-10-16: 10 mg via INTRAVENOUS
  Filled 2021-10-16: qty 10

## 2021-10-16 MED ORDER — MAGNESIUM SULFATE 2 GM/50ML IV SOLN
INTRAVENOUS | Status: AC
  Filled 2021-10-16: qty 50

## 2021-10-16 MED ORDER — SODIUM CHLORIDE 0.9 % IV SOLN: INTRAVENOUS | Status: DC

## 2021-10-16 MED ORDER — HEPARIN SOD (PORK) LOCK FLUSH 100 UNIT/ML IV SOLN
500.0000 [IU] | Freq: Once | INTRAVENOUS | Status: AC
  Administered 2021-10-16: 500 [IU]

## 2021-10-16 MED ORDER — SODIUM CHLORIDE 0.9% FLUSH
10.0000 mL | Freq: Once | INTRAVENOUS | Status: AC
  Administered 2021-10-16: 10 mL

## 2021-10-16 MED ORDER — POTASSIUM CHLORIDE 10 MEQ/100ML IV SOLN
10.0000 meq | Freq: Once | INTRAVENOUS | Status: AC
  Administered 2021-10-16: 10 meq via INTRAVENOUS

## 2021-10-16 MED ORDER — MAGNESIUM SULFATE 2 GM/50ML IV SOLN
2.0000 g | Freq: Once | INTRAVENOUS | Status: AC
  Administered 2021-10-16: 2 g via INTRAVENOUS

## 2021-10-16 NOTE — Progress Notes (Signed)
Received call from pt with complaint of ongoing fatigue despite frequent IV fluids and rest at home.  Pt also complaint of ongoing nausea not alleviated with po zofran or compazine .  Per MD pt needing lab, [facility] f/u and IV fluids today.  Appt scheduled, pt notified and verbalized understanding of appt times.

## 2021-10-16 NOTE — Progress Notes (Signed)
[facility] Follow up:    Pamela [name], [place] [address]   DIAGNOSIS:  Cancer Staging  Malignant neoplasm of lower-outer quadrant of right breast of female, estrogen receptor positive ([facility]) Staging form: Breast, AJCC 8th Edition - Clinical stage from 05/11/2020: Stage IA (cT1b, cN0, cM0, G3, ER+, PR-, HER2+) - Signed by Nicholas Lose, MD on 05/11/2020 Stage prefix: Initial diagnosis - Pathologic stage from 06/02/2020: Stage IA (pT1c, pN0, cM0, G3, ER+, PR-, HER2+) - Signed by Gardenia Phlegm, NP on 06/15/2020 Stage prefix: Initial diagnosis Histologic grading system: 3 grade system   SUMMARY OF ONCOLOGIC HISTORY: Oncology History  Malignant neoplasm of lower-outer quadrant of right breast of female, estrogen receptor positive ([facility])  05/03/2020 Initial Diagnosis   Screening mammogram showed a 1.0cm mass at the 6 o'clock position in the right breast. Diagnostic mammogram and US showed the 1.2cm mass at the 6 o'clock position in the right breast. Biopsy showed IDC, grade 3, HER-2 positive (3+), ER 15% weak, PR-, Ki67 80%.   05/11/2020 Cancer Staging   Staging form: Breast, AJCC 8th Edition - Clinical stage from 05/11/2020: Stage IA (cT1b, cN0, cM0, G3, ER+, PR-, HER2+) - Signed by Nicholas Lose, MD on 05/11/2020 Stage prefix: Initial diagnosis   05/19/2020 Genetic Testing   Negative hereditary cancer genetic testing: no pathogenic variants detected in [facility] Breast STAT Panel and Common Hereditary Cancers Panel.  The report dates are May 19, 2020 (STAT) and May 23, 2020 (Common Hereditary).   The Common Hereditary Cancers Panel offered by [facility] includes sequencing and/or deletion duplication testing of the following 47 genes: APC, ATM, AXIN2, BARD1, BMPR1A, BRCA1, BRCA2, BRIP1, CDH1, CDK4, CDKN2A (p14ARF), CDKN2A (p16INK4a), CHEK2, CTNNA1, DICER1, EPCAM (Deletion/duplication testing only), GREM1 (promoter region  deletion/duplication testing only), GREM1, HOXB13, KIT, MEN1, MLH1, MSH2, MSH3, MSH6, MUTYH, NBN, NF1, NHTL1, PALB2, PDGFRA, PMS2, POLD1, POLE, PTEN, RAD50, RAD51C, RAD51D, SDHA, SDHB, SDHC, SDHD, SMAD4, SMARCA4. STK11, TP53, TSC1, TSC2, and VHL.  The following genes were evaluated for sequence changes only: SDHA and HOXB13 c.251G>A variant only.es only: SDHA and HOXB13 c.251G>A variant only.   06/02/2020 Surgery   Right lumpectomy ([facility]): invasive and in situ ductal carcinoma, 1.3cm, clear margins, 1 right axillary lymph node negative for carcinoma.   06/02/2020 Cancer Staging   Staging form: Breast, AJCC 8th Edition - Pathologic stage from 06/02/2020: Stage IA (pT1c, pN0, cM0, G3, ER+, PR-, HER2+) - Signed by Gardenia Phlegm, NP on 06/15/2020 Stage prefix: Initial diagnosis Histologic grading system: 3 grade system   07/08/2020 - 10/21/2020 Chemotherapy   Taxol Herceptin followed by Herceptin maintenance   11/11/2020 -  Chemotherapy   Herceptin maintenance       04/14/2021 -  Anti-estrogen oral therapy   Tamoxifen 10 mg   08/21/2021 -  Chemotherapy   Patient is on Treatment Plan : BREAST METASTATIC fam-trastuzumab deruxtecan-nxki (Enhertu) q21d       CURRENT THERAPY: Enhertu  INTERVAL HISTORY: Pamela [name] 56 y.o. female returns for follow-up and evaluation while receiving Enhertu treatment.  S/p cycle 3 of Enhertu.  she is here for extreme fatigue.  After walking across the room she has decreased energy in .  last echo on 09/21/2021 normal 60-65%.  She has constipation alternating with diarrhea.  She is nauseated to the extent that it will occasionally wake her during her sleep.     At her last appt on 10/11/2021 her  potassium was 2.6.  She was  prescribed potassium supplementation, but was unable to take the potassium yesterday.  She tolerated it today.    Pamela [name] notes that she experiences constipation more than diarrhea.  Hard stool, straining to use the restroom x 1st  couple of days.  To alleviate this she takes Miralax, stool gets softer after a few days.  Has hemorrhoids and everything hurts.    She is drinking 4-5 bottles of water per day.  She has also been drinking ginger ale.    Patient Active Problem List   Diagnosis Date Noted   Breast cancer ([facility]) 08/25/2021   Nausea without vomiting 08/25/2021   Prediabetes 03/13/2021   Non-intractable vomiting 07/13/2020   Port-A-Cath in place 07/08/2020   Genetic testing 05/20/2020   Family history of breast cancer 05/12/2020   Family history of lung cancer 05/12/2020   Malignant neoplasm of lower-outer quadrant of right breast of female, estrogen receptor positive ([facility]) 05/03/2020   Elevated troponin level not due myocardial infarction 09/18/2019   Mixed hyperlipidemia 09/18/2019   Hypertensive emergency 09/17/2019   Primary osteoarthritis of right hip 08/06/2018   Seasonal allergies 06/12/2018   Fibrocystic disease of breast 05/27/2018   Obstructive sleep apnea syndrome, moderate 10/27/2013   Shift work sleep disorder 10/27/2013   Psychic factors associated with diseases classified elsewhere 08/07/2013   High cholesterol 07/21/2013   Essential hypertension 07/21/2013   Snoring 07/21/2013   IUD contraception-inserted 10/01/11 10/01/2011   Class 3 severe obesity due to excess calories with serious comorbidity and body mass index (BMI) of 45.0 to 49.9 in adult ([facility]) 09/03/2011    is allergic to pollen extract, compazine [prochlorperazine], latex, codeine, shellfish allergy, and betadine [povidone iodine].  MEDICAL HISTORY: Past Medical History:  Diagnosis Date   Anxiety    Asthma    Back pain    Breast cancer ([facility])    Constipation    DUB (dysfunctional uterine bleeding) 2007   Edema of both lower extremities    Elevated cholesterol    Family history of breast cancer 05/12/2020   Family history of lung cancer 05/12/2020   Food allergy    H/O menorrhagia 05/01/2006   High cholesterol     History of ovarian cyst 2007   Hypertension 03/31/2005   Increased BMI 07/11/2006   Joint pain    Migraine    N&V (nausea and vomiting)    Obesity 2007   Obstructive sleep apnea syndrome, moderate 10/27/2013   no cpap   Sleep apnea    SOB (shortness of breath)    Vitamin D deficiency    Vitamin D deficiency    Weight loss 07/11/2006    SURGICAL HISTORY: Past Surgical History:  Procedure Laterality Date   BREAST LUMPECTOMY WITH RADIOACTIVE SEED AND SENTINEL LYMPH NODE BIOPSY Right 06/02/2020   Procedure: RIGHT BREAST LUMPECTOMY WITH RADIOACTIVE SEED AND RIGHT SENTINEL LYMPH NODE BIOPSY;  Surgeon: [name], MD;  Location: [facility];  Service: General;  Laterality: Right;   BREAST REDUCTION SURGERY  05/1991   CESAREAN SECTION     HYSTEROSCOPY  05/01/2006   PORTACATH PLACEMENT Right 06/02/2020   Procedure: INSERTION PORT-A-CATH;  Surgeon: [name], MD;  Location: [facility];  Service: General;  Laterality: Right;    SOCIAL HISTORY: Social History   Socioeconomic History   Marital status: Single    Spouse name: Not on file   Number of children: Not on file   Years of education: Not on file   Highest education level: Not on  file  Occupational History   Occupation: Education officer, museum  Tobacco Use   Smoking status: Never   Smokeless tobacco: Never  Vaping Use   Vaping Use: Never used  Substance and Sexual Activity   Alcohol use: No   Drug use: Not Currently    Frequency: 2.0 times per week   Sexual activity: Not Currently    Birth control/protection: I.U.D.    Comment: Mirena  Other Topics Concern   Not on file  Social History Narrative   Not on file   Social Determinants of Health   Financial Resource Strain: Not on file  Food Insecurity: Not on file  Transportation Needs: Not on file  Physical Activity: Not on file  Stress: Not on file  Social Connections: Not on file  Intimate Partner Violence: Not on file    FAMILY  HISTORY: Family History  Problem Relation Age of Onset   Hypertension Mother    High Cholesterol Mother    Thyroid disease Mother    Cancer Mother    Sleep apnea Mother    Hypertension Father    Heart attack Father    Sudden death Father    Diabetes Maternal Grandmother    Bone cancer Maternal Grandfather        dx after 24   Breast cancer Maternal Aunt        dx 49s   Lung cancer Maternal Aunt        dx after 50    Review of Systems  Constitutional:  Positive for fatigue. Negative for appetite change, chills, fever and unexpected weight change.  HENT:   Negative for hearing loss, lump/mass and trouble swallowing.   Eyes:  Negative for eye problems and icterus.  Respiratory:  Negative for chest tightness, cough and shortness of breath.   Cardiovascular:  Negative for chest pain, leg swelling and palpitations.  Gastrointestinal:  Positive for constipation and nausea. Negative for abdominal distention, abdominal pain, diarrhea and vomiting.  Endocrine: Negative for hot flashes.  Genitourinary:  Negative for difficulty urinating.   Musculoskeletal:  Negative for arthralgias.  Skin:  Negative for itching and rash.  Neurological:  Negative for dizziness, extremity weakness, headaches and numbness.  Hematological:  Negative for adenopathy. Does not bruise/bleed easily.  Psychiatric/Behavioral:  Positive for sleep disturbance. Negative for depression. The patient is not nervous/anxious.   All other systems reviewed and are negative.     PHYSICAL EXAMINATION  ECOG PERFORMANCE STATUS: 2 - Symptomatic, <50% confined to bed  Vitals:   10/16/21 1402  BP: (!) 136/95  Pulse: (!) 106  Resp: 18  Temp: 97.8 F (36.6 C)  SpO2: 100%    Physical Exam Constitutional:      General: She is not in acute distress.    Appearance: Normal appearance. She is not toxic-appearing.  HENT:     Head: Normocephalic and atraumatic.  Eyes:     General: No scleral icterus. Cardiovascular:      Rate and Rhythm: Normal rate and regular rhythm.     Pulses: Normal pulses.     Heart sounds: Normal heart sounds.  Pulmonary:     Effort: Pulmonary effort is normal.     Breath sounds: Normal breath sounds.  Abdominal:     General: Abdomen is flat. Bowel sounds are normal. There is no distension.     Palpations: Abdomen is soft.     Tenderness: There is no abdominal tenderness.  Musculoskeletal:        General: No swelling.  Cervical back: Neck supple.  Lymphadenopathy:     Cervical: No cervical adenopathy.  Skin:    General: Skin is warm and dry.     Findings: No rash.  Neurological:     General: No focal deficit present.     Mental Status: She is alert.  Psychiatric:        Mood and Affect: Mood normal.        Behavior: Behavior normal.     LABORATORY DATA:  CBC    Component Value Date/Time   WBC 4.6 10/16/2021 1338   WBC 6.9 07/21/2021 1143   RBC 3.89 10/16/2021 1338   HGB 10.7 (L) 10/16/2021 1338   HGB 13.2 09/22/2019 1040   HCT 31.8 (L) 10/16/2021 1338   HCT 41.3 09/22/2019 1040   PLT 232 10/16/2021 1338   PLT 247 09/22/2019 1040   MCV 81.7 10/16/2021 1338   MCV 83 09/22/2019 1040   MCH 27.5 10/16/2021 1338   MCHC 33.6 10/16/2021 1338   RDW 16.1 (H) 10/16/2021 1338   RDW 14.7 09/22/2019 1040   LYMPHSABS 1.4 10/16/2021 1338   MONOABS 0.4 10/16/2021 1338   EOSABS 0.1 10/16/2021 1338   BASOSABS 0.0 10/16/2021 1338    CMP     Component Value Date/Time   NA 137 10/16/2021 1338   NA 142 09/22/2019 1040   K 3.3 (L) 10/16/2021 1338   CL 100 10/16/2021 1338   CO2 32 10/16/2021 1338   GLUCOSE 132 (H) 10/16/2021 1338   BUN 8 10/16/2021 1338   BUN 11 09/22/2019 1040   CREATININE 0.89 10/16/2021 1338   CALCIUM 8.9 10/16/2021 1338   PROT 6.9 10/16/2021 1338   PROT 6.7 06/17/2019 1054   ALBUMIN 3.8 10/16/2021 1338   ALBUMIN 4.5 06/17/2019 1054   AST 23 10/16/2021 1338   ALT 19 10/16/2021 1338   ALKPHOS 77 10/16/2021 1338   BILITOT 0.5 10/16/2021 1338    GFRNONAA >60 10/16/2021 1338   GFRAA 77 09/22/2019 1040      ASSESSMENT and THERAPY PLAN:   Malignant neoplasm of lower-outer quadrant of right breast of female, estrogen receptor positive ([facility]) Pamela [name] is a 56 year old woman with metastatic breast cancer on treatment with Enhertu here today for f/u and evaluation of extreme fatigue.    She is likely dehydrated from her treatment and I placed orders for her to receive IV fluids with magnesium and potassium today.    Iron and b12 levels are pending.  She will f/u with Dr. Lindi Adie on 8/24 for labs, and her next treatment.  During her appointment with Dr. Lindi Adie they can discuss whether additional dose reductions in her treatment are indicated.      All questions were answered. The patient knows to call the clinic with any problems, questions or concerns. We can certainly see the patient much sooner if necessary.  Total encounter time:20 minutes*in face-to-face visit time, chart review, lab review, care coordination, order entry, and documentation of the encounter time.    Wilber Bihari, NP 10/20/21 12:52 AM Medical Oncology and Hematology [facility] [place], [place] [address] Tel. [phone]    Fax. [phone]  *Total Encounter Time as defined by the Centers for Medicare and Medicaid Services includes, in addition to the face-to-face time of a patient visit (documented in the note above) non-face-to-face time: obtaining and reviewing outside history, ordering and reviewing medications, tests or procedures, care coordination (communications with other health care professionals or caregivers) and documentation in  the medical record.

## 2021-10-17 ENCOUNTER — Telehealth: Payer: Self-pay

## 2021-10-17 LAB — FERRITIN: Ferritin: 317 ng/mL — ABNORMAL HIGH (ref 11–307)

## 2021-10-17 NOTE — Telephone Encounter (Signed)
-----   Message from Gardenia Phlegm, NP sent at 10/17/2021  9:27 AM EDT ----- Please call and check on bone trivia and see how she is doing after receiving fluids ----- Message ----- From: Interface, Lab In [place] Sent: 10/16/2021   2:54 PM EDT To: Gardenia Phlegm, NP

## 2021-10-17 NOTE — Telephone Encounter (Signed)
Called pt to speak with her regarding how she is feeling after IVF yesterday, per NP. Pt states she is feeling better today but has not done much to exert herself. Finds herself needing to take deep breaths but the ShOB has improved. Advised pt if she develops a cough or pain on inspiration or if ShOB worsens she should call us. She verbalized thanks and understanding.

## 2021-10-19 NOTE — Assessment & Plan Note (Addendum)
Pamela [name] is a 56 year old woman with metastatic breast cancer on treatment with Enhertu here today for f/u and evaluation of extreme fatigue.    She is likely dehydrated from her treatment and I placed orders for her to receive IV fluids with magnesium and potassium today.    Iron and b12 levels are pending.  She will f/u with Dr. Lindi Adie on 8/24 for labs, and her next treatment.  During her appointment with Dr. Lindi Adie they can discuss whether additional dose reductions in her treatment are indicated.

## 2021-10-20 ENCOUNTER — Encounter: Payer: Self-pay | Admitting: Hematology and Oncology

## 2021-10-22 NOTE — Progress Notes (Signed)
Patient Care Team: [name], MD as PCP - General (Internal Medicine) Mcarthur Rossetti, MD as Consulting Physician (Orthopedic Surgery) Mauro Kaufmann, RN as Oncology Nurse Navigator Rockwell Germany, RN as Oncology Nurse Navigator [name], MD as Consulting Physician (General Surgery) Nicholas Lose, MD as Consulting Physician (Hematology and Oncology) Eppie Gibson, MD as Attending Physician (Radiation Oncology)  DIAGNOSIS: No diagnosis found.  SUMMARY OF ONCOLOGIC HISTORY: Oncology History  Malignant neoplasm of lower-outer quadrant of right breast of female, estrogen receptor positive ([place])  05/03/2020 Initial Diagnosis   Screening mammogram showed a 1.0cm mass at the 6 o'clock position in the right breast. Diagnostic mammogram and US showed the 1.2cm mass at the 6 o'clock position in the right breast. Biopsy showed IDC, grade 3, HER-2 positive (3+), ER 15% weak, PR-, Ki67 80%.   05/11/2020 Cancer Staging   Staging form: Breast, AJCC 8th Edition - Clinical stage from 05/11/2020: Stage IA (cT1b, cN0, cM0, G3, ER+, PR-, HER2+) - Signed by Nicholas Lose, MD on 05/11/2020 Stage prefix: Initial diagnosis   05/19/2020 Genetic Testing   Negative hereditary cancer genetic testing: no pathogenic variants detected in [facility] Breast STAT Panel and Common Hereditary Cancers Panel.  The report dates are May 19, 2020 (STAT) and May 23, 2020 (Common Hereditary).   The Common Hereditary Cancers Panel offered by [facility] includes sequencing and/or deletion duplication testing of the following 47 genes: APC, ATM, AXIN2, BARD1, BMPR1A, BRCA1, BRCA2, BRIP1, CDH1, CDK4, CDKN2A (p14ARF), CDKN2A (p16INK4a), CHEK2, CTNNA1, DICER1, EPCAM (Deletion/duplication testing only), GREM1 (promoter region deletion/duplication testing only), GREM1, HOXB13, KIT, MEN1, MLH1, MSH2, MSH3, MSH6, MUTYH, NBN, NF1, NHTL1, PALB2, PDGFRA, PMS2, POLD1, POLE, PTEN, RAD50, RAD51C, RAD51D, SDHA, SDHB, SDHC, SDHD,  SMAD4, SMARCA4. STK11, TP53, TSC1, TSC2, and VHL.  The following genes were evaluated for sequence changes only: SDHA and HOXB13 c.251G>A variant only.es only: SDHA and HOXB13 c.251G>A variant only.   06/02/2020 Surgery   Right lumpectomy ([facility]): invasive and in situ ductal carcinoma, 1.3cm, clear margins, 1 right axillary lymph node negative for carcinoma.   06/02/2020 Cancer Staging   Staging form: Breast, AJCC 8th Edition - Pathologic stage from 06/02/2020: Stage IA (pT1c, pN0, cM0, G3, ER+, PR-, HER2+) - Signed by Gardenia Phlegm, NP on 06/15/2020 Stage prefix: Initial diagnosis Histologic grading system: 3 grade system   07/08/2020 - 10/21/2020 Chemotherapy   Taxol Herceptin followed by Herceptin maintenance   11/11/2020 -  Chemotherapy   Herceptin maintenance       04/14/2021 -  Anti-estrogen oral therapy   Tamoxifen 10 mg   08/21/2021 -  Chemotherapy   Patient is on Treatment Plan : BREAST METASTATIC fam-trastuzumab deruxtecan-nxki (Enhertu) q21d       CHIEF COMPLIANT: Cycle 4 Enhertu    INTERVAL HISTORY: Pamela [name] is a  56 y.o. with above-mentioned history of metastatic breast cancer. She reports to the clinic today for cycle 4 and follow-up.    ALLERGIES:  is allergic to pollen extract, compazine [prochlorperazine], latex, codeine, shellfish allergy, and betadine [povidone iodine].  MEDICATIONS:  Current Outpatient Medications  Medication Sig Dispense Refill   amLODipine (NORVASC) 10 MG tablet Take 1 tablet (10 mg total) by mouth daily. 90 tablet 2   aspirin EC 81 MG tablet Take 1 tablet (81 mg total) by mouth daily. Swallow whole. 30 tablet 11   Azilsartan-Chlorthalidone (EDARBYCLOR) 40-25 MG TABS Take 1 tablet by mouth daily. 90 tablet 2   dexamethasone (DECADRON) 4 MG tablet Take 1 tablet (4  mg total) by mouth 2 (two) times daily with a meal. Day 2,3,4 one tablet with lunch 30 tablet 1   diphenoxylate-atropine (LOMOTIL) 2.5-0.025 MG tablet Take 1  tablet by mouth 4 (four) times daily as needed for diarrhea or loose stools. 30 tablet 0   hydrOXYzine (ATARAX) 25 MG tablet Take 1 tablet by mouth every 12 (twelve) hours as needed.     levocetirizine (XYZAL) 5 MG tablet Take 1 tablet (5 mg total) by mouth every evening. 90 tablet 0   levonorgestrel (MIRENA, 52 MG,) 20 MCG/DAY IUD 1 each by Intrauterine route once.     lidocaine-prilocaine (EMLA) cream Apply 1 application topically daily as needed. 60 g 0   LORazepam (ATIVAN) 0.5 MG tablet Take 1 tablet (0.5 mg total) by mouth every 8 (eight) hours as needed for anxiety. 30 tablet 0   montelukast (SINGULAIR) 10 MG tablet Take 1 tablet (10 mg total) by mouth daily. 90 tablet 0   omeprazole (PRILOSEC) 40 MG capsule Take 40 mg by mouth as needed.     ondansetron (ZOFRAN) 8 MG tablet Take 1 tablet (8 mg total) by mouth 2 (two) times daily as needed for refractory nausea / vomiting. Start on day 3 after chemo. 30 tablet 1   potassium chloride SA (KLOR-CON M) 20 MEQ tablet Take 1 tablet (20 mEq total) by mouth daily. 30 tablet 0   prochlorperazine (COMPAZINE) 10 MG tablet Take 1 tablet (10 mg total) by mouth every 6 (six) hours as needed (Nausea or vomiting). 30 tablet 1   rosuvastatin (CRESTOR) 40 MG tablet Take 1 tablet (40 mg total) by mouth daily. 90 tablet 3   Current Facility-Administered Medications  Medication Dose Route Frequency Provider Last Rate Last Admin   0.9 %  sodium chloride infusion   Intravenous PRN Ghumman, Ramandeep, NP       Facility-Administered Medications Ordered in Other Visits  Medication Dose Route Frequency Provider Last Rate Last Admin   heparin lock flush 100 unit/mL  500 Units Intracatheter Once Nicholas Lose, MD       sodium chloride flush (NS) 0.9 % injection 10 mL  10 mL Intracatheter Once Nicholas Lose, MD        PHYSICAL EXAMINATION: ECOG PERFORMANCE STATUS: {CHL ONC ECOG YW:[id number]}  There were no vitals filed for this visit. There were no vitals filed  for this visit.  BREAST:*** No palpable masses or nodules in either right or left breasts. No palpable axillary supraclavicular or infraclavicular adenopathy no breast tenderness or nipple discharge. (exam performed in the presence of a chaperone)  LABORATORY DATA:  I have reviewed the data as listed    Latest Ref Rng & Units 10/16/2021    1:38 PM 10/06/2021   10:17 AM 09/15/2021    9:47 AM  CMP  Glucose 70 - 99 mg/dL 132  111  115   BUN 6 - 20 mg/dL 8  6  14    Creatinine 0.44 - 1.00 mg/dL 0.89  0.78  0.81   Sodium 135 - 145 mmol/L 137  144  142   Potassium 3.5 - 5.1 mmol/L 3.3  2.6  3.4   Chloride 98 - 111 mmol/L 100  103  107   CO2 22 - 32 mmol/L 32  34  30   Calcium 8.9 - 10.3 mg/dL 8.9  8.0  9.2   Total Protein 6.5 - 8.1 g/dL 6.9  6.5  6.6   Total Bilirubin 0.3 - 1.2 mg/dL 0.5  0.7  0.5  Alkaline Phos 38 - 126 U/L 77  57  54   AST 15 - 41 U/L 23  17  13    ALT 0 - 44 U/L 19  16  12      Lab Results  Component Value Date   WBC 4.6 10/16/2021   HGB 10.7 (L) 10/16/2021   HCT 31.8 (L) 10/16/2021   MCV 81.7 10/16/2021   PLT 232 10/16/2021   NEUTROABS 2.6 10/16/2021    ASSESSMENT & PLAN:  No problem-specific Assessment & Plan notes found for this encounter.    No orders of the defined types were placed in this encounter.  The patient has a good understanding of the overall plan. she agrees with it. she will call with any problems that may develop before the next visit here. Total time spent: 30 mins including face to face time and time spent for planning, charting and co-ordination of care   Suzzette Righter, Moore 10/22/21    I Gardiner Coins am scribing for Dr. Lindi Adie  ***

## 2021-10-23 ENCOUNTER — Inpatient Hospital Stay: Payer: 59 | Admitting: Hematology and Oncology

## 2021-10-23 ENCOUNTER — Inpatient Hospital Stay: Payer: 59

## 2021-10-24 ENCOUNTER — Other Ambulatory Visit (HOSPITAL_COMMUNITY): Payer: 59

## 2021-10-25 ENCOUNTER — Encounter: Payer: 59 | Admitting: Internal Medicine

## 2021-10-25 ENCOUNTER — Ambulatory Visit (HOSPITAL_COMMUNITY)
Admission: RE | Admit: 2021-10-25 | Discharge: 2021-10-25 | Disposition: A | Discharge status: Home / Self Care | Payer: 59 | Source: Ambulatory Visit | Attending: Hematology and Oncology | Admitting: Hematology and Oncology

## 2021-10-25 DIAGNOSIS — Z170 Estrogen receptor positive status [ER+]: Secondary | ICD-10-CM | POA: Diagnosis present

## 2021-10-25 DIAGNOSIS — C50511 Malignant neoplasm of lower-outer quadrant of right female breast: Secondary | ICD-10-CM | POA: Diagnosis present

## 2021-10-25 MED ORDER — IOHEXOL 300 MG/ML  SOLN
100.0000 mL | Freq: Once | INTRAMUSCULAR | Status: AC | PRN
  Administered 2021-10-25: 100 mL via INTRAVENOUS

## 2021-10-25 MED ORDER — HEPARIN SOD (PORK) LOCK FLUSH 100 UNIT/ML IV SOLN
INTRAVENOUS | Status: AC
  Administered 2021-10-25: 500 [IU]
  Filled 2021-10-25: qty 5

## 2021-10-25 MED ORDER — HEPARIN SOD (PORK) LOCK FLUSH 100 UNIT/ML IV SOLN: 500.0000 [IU] | Freq: Once | INTRAVENOUS | Status: DC

## 2021-10-25 MED FILL — Dexamethasone Sodium Phosphate Inj 100 MG/10ML: INTRAMUSCULAR | Qty: 1 | Status: AC

## 2021-10-25 MED FILL — Fosaprepitant Dimeglumine For IV Infusion 150 MG (Base Eq): INTRAVENOUS | Qty: 5 | Status: AC

## 2021-10-26 ENCOUNTER — Inpatient Hospital Stay: Payer: 59

## 2021-10-26 ENCOUNTER — Other Ambulatory Visit: Payer: Self-pay

## 2021-10-26 ENCOUNTER — Encounter: Payer: Self-pay | Admitting: *Deleted

## 2021-10-26 ENCOUNTER — Inpatient Hospital Stay (HOSPITAL_BASED_OUTPATIENT_CLINIC_OR_DEPARTMENT_OTHER): Payer: 59 | Admitting: Hematology and Oncology

## 2021-10-26 DIAGNOSIS — Z170 Estrogen receptor positive status [ER+]: Secondary | ICD-10-CM

## 2021-10-26 DIAGNOSIS — Z5112 Encounter for antineoplastic immunotherapy: Secondary | ICD-10-CM | POA: Diagnosis not present

## 2021-10-26 DIAGNOSIS — R110 Nausea: Secondary | ICD-10-CM

## 2021-10-26 DIAGNOSIS — C50919 Malignant neoplasm of unspecified site of unspecified female breast: Secondary | ICD-10-CM

## 2021-10-26 DIAGNOSIS — C50511 Malignant neoplasm of lower-outer quadrant of right female breast: Secondary | ICD-10-CM | POA: Diagnosis not present

## 2021-10-26 DIAGNOSIS — Z95828 Presence of other vascular implants and grafts: Secondary | ICD-10-CM

## 2021-10-26 LAB — CMP (CANCER CENTER ONLY)
ALT: 12 U/L (ref 0–44)
AST: 16 U/L (ref 15–41)
Albumin: 3.7 g/dL (ref 3.5–5.0)
Alkaline Phosphatase: 69 U/L (ref 38–126)
Anion gap: 5 (ref 5–15)
BUN: 5 mg/dL — ABNORMAL LOW (ref 6–20)
CO2: 34 mmol/L — ABNORMAL HIGH (ref 22–32)
Calcium: 9.2 mg/dL (ref 8.9–10.3)
Chloride: 104 mmol/L (ref 98–111)
Creatinine: 0.74 mg/dL (ref 0.44–1.00)
GFR, Estimated: >60 mL/min (ref >60)
Glucose, Bld: 105 mg/dL — ABNORMAL HIGH (ref 70–99)
Potassium: 3.2 mmol/L — ABNORMAL LOW (ref 3.5–5.1)
Sodium: 143 mmol/L (ref 135–145)
Total Bilirubin: 0.7 mg/dL (ref 0.3–1.2)
Total Protein: 6.5 g/dL (ref 6.5–8.1)

## 2021-10-26 LAB — CBC WITH DIFFERENTIAL (CANCER CENTER ONLY)
Abs Immature Granulocytes: 0.12 10*3/uL — ABNORMAL HIGH (ref 0.00–0.07)
Basophils Absolute: 0 10*3/uL (ref 0.0–0.1)
Basophils Relative: 1 %
Eosinophils Absolute: 0.1 10*3/uL (ref 0.0–0.5)
Eosinophils Relative: 2 %
HCT: 30.5 % — ABNORMAL LOW (ref 36.0–46.0)
Hemoglobin: 10 g/dL — ABNORMAL LOW (ref 12.0–15.0)
Immature Granulocytes: 2 %
Lymphocytes Relative: 22 %
Lymphs Abs: 1.3 10*3/uL (ref 0.7–4.0)
MCH: 27.8 pg (ref 26.0–34.0)
MCHC: 32.8 g/dL (ref 30.0–36.0)
MCV: 84.7 fL (ref 80.0–100.0)
Monocytes Absolute: 0.9 10*3/uL (ref 0.1–1.0)
Monocytes Relative: 15 %
Neutro Abs: 3.5 10*3/uL (ref 1.7–7.7)
Neutrophils Relative %: 58 %
Platelet Count: 315 10*3/uL (ref 150–400)
RBC: 3.6 MIL/uL — ABNORMAL LOW (ref 3.87–5.11)
RDW: 18.7 % — ABNORMAL HIGH (ref 11.5–15.5)
WBC Count: 6 10*3/uL (ref 4.0–10.5)
nRBC: 0.8 % — ABNORMAL HIGH (ref 0.0–0.2)

## 2021-10-26 MED ORDER — SODIUM CHLORIDE 0.9% FLUSH
10.0000 mL | INTRAVENOUS | Status: DC | PRN
  Administered 2021-10-26: 10 mL

## 2021-10-26 MED ORDER — FAM-TRASTUZUMAB DERUXTECAN-NXKI CHEMO 100 MG IV SOLR
4.3000 mg/kg | Freq: Once | INTRAVENOUS | Status: AC
  Administered 2021-10-26: 600 mg via INTRAVENOUS
  Filled 2021-10-26: qty 30

## 2021-10-26 MED ORDER — SODIUM CHLORIDE 0.9 % IV SOLN: INTRAVENOUS | Status: DC

## 2021-10-26 MED ORDER — SODIUM CHLORIDE 0.9% FLUSH
10.0000 mL | Freq: Once | INTRAVENOUS | Status: AC
  Administered 2021-10-26: 10 mL

## 2021-10-26 MED ORDER — SODIUM CHLORIDE 0.9 % IV SOLN
10.0000 mg | Freq: Once | INTRAVENOUS | Status: AC
  Administered 2021-10-26: 10 mg via INTRAVENOUS
  Filled 2021-10-26: qty 10

## 2021-10-26 MED ORDER — ACETAMINOPHEN 325 MG PO TABS
650.0000 mg | ORAL_TABLET | Freq: Once | ORAL | Status: AC
  Administered 2021-10-26: 650 mg via ORAL
  Filled 2021-10-26: qty 2

## 2021-10-26 MED ORDER — HEPARIN SOD (PORK) LOCK FLUSH 100 UNIT/ML IV SOLN
500.0000 [IU] | Freq: Once | INTRAVENOUS | Status: AC | PRN
  Administered 2021-10-26: 500 [IU]

## 2021-10-26 MED ORDER — SODIUM CHLORIDE 0.9 % IV SOLN
150.0000 mg | Freq: Once | INTRAVENOUS | Status: AC
  Administered 2021-10-26: 150 mg via INTRAVENOUS
  Filled 2021-10-26: qty 150

## 2021-10-26 MED ORDER — PALONOSETRON HCL INJECTION 0.25 MG/5ML
0.2500 mg | Freq: Once | INTRAVENOUS | Status: AC
  Administered 2021-10-26: 0.25 mg via INTRAVENOUS
  Filled 2021-10-26: qty 5

## 2021-10-26 MED ORDER — DEXTROSE 5 % IV SOLN: Freq: Once | INTRAVENOUS | Status: AC

## 2021-10-26 NOTE — Patient Instructions (Signed)
[facility] MEDICAL [facility]COLOGY  Discharge Instructions: Thank you for choosing [facility] to provide your oncology and hematology care.   If you have a lab appointment with the Cancer Center, please go directly to the Cancer Center and check in at the registration area.   Wear comfortable clothing and clothing appropriate for easy access to any Portacath or PICC line.   We strive to give you quality time with your provider. You may need to reschedule your appointment if you arrive late (15 or more minutes).  Arriving late affects you and other patients whose appointments are after yours.  Also, if you miss three or more appointments without notifying the office, you may be dismissed from the clinic at the provider's discretion.      For prescription refill requests, have your pharmacy contact our office and allow 72 hours for refills to be completed.    Today you received the following chemotherapy and/or immunotherapy agents: Enhertu      To help prevent nausea and vomiting after your treatment, we encourage you to take your nausea medication as directed.  BELOW ARE SYMPTOMS THAT SHOULD BE REPORTED IMMEDIATELY: *FEVER GREATER THAN 100.4 F (38 C) OR HIGHER *CHILLS OR SWEATING *NAUSEA AND VOMITING THAT IS NOT CONTROLLED WITH YOUR NAUSEA MEDICATION *UNUSUAL SHORTNESS OF BREATH *UNUSUAL BRUISING OR BLEEDING *URINARY PROBLEMS (pain or burning when urinating, or frequent urination) *BOWEL PROBLEMS (unusual diarrhea, constipation, pain near the anus) TENDERNESS IN MOUTH AND THROAT WITH OR WITHOUT PRESENCE OF ULCERS (sore throat, sores in mouth, or a toothache) UNUSUAL RASH, SWELLING OR PAIN  UNUSUAL VAGINAL DISCHARGE OR ITCHING   Items with * indicate a potential emergency and should be followed up as soon as possible or go to the Emergency Department if any problems should occur.  Please show the CHEMOTHERAPY ALERT CARD or IMMUNOTHERAPY ALERT CARD at check-in to  the Emergency Department and triage nurse.  Should you have questions after your visit or need to cancel or reschedule your appointment, please contact [facility]  Dept: [phone]  and follow the prompts.  Office hours are 8:00 a.m. to 4:30 p.m. Monday - Friday. Please note that voicemails left after 4:00 p.m. may not be returned until the following business day.  We are closed weekends and major holidays. You have access to a nurse at all times for urgent questions. Please call the main number to the clinic Dept: [phone] and follow the prompts.   For any non-urgent questions, you may also contact your provider using MyChart. We now offer e-Visits for anyone 18 and older to request care online for non-urgent symptoms. For details visit [email or web address].   Also download the MyChart app! Go to the app store, search "MyChart", open the app, select [facility], and log in with your MyChart username and password.  Masks are optional in the cancer centers. If you would like for your care team to wear a mask while they are taking care of you, please let them know. You may have one support person who is at least 55 years old accompany you for your appointments. 

## 2021-10-26 NOTE — Progress Notes (Signed)
Per MD okay to run NS fluids at 750 mL/hr.

## 2021-10-26 NOTE — Assessment & Plan Note (Addendum)
05/03/2020:Screening mammogram showed a 1.0cm mass at the 6 o'clock position in the right breast. Diagnostic mammogram and US showed the 1.2cm mass at the 6 o'clock position in the right breast. Biopsy showed IDC, grade 3, HER-2 positive (3+), ER 15% weak, PR-, Ki67 80%.  06/02/2020:Right lumpectomy ([facility]): invasive and in situ ductal carcinoma, 1.3cm, clear margins, 1 right axillary lymph node negative for carcinoma. ER 15% weak, PR-,HER-2 positive (3+) Ki67 80%.  Treatment plan: 1.Adjuvant chemotherapy with Taxol Herceptinfollowed byHerceptin maintenance.Stopped after 11 cycles of TaxolHerceptin maintenance completed 07/07/2021 2.Adjuvant radiation therapy9/21/2022-12/19/2020 3.Adjuvant antiestrogen therapywith tamoxifen started October 2022 4.CT angiogram 07/13/2021: Right supraclavicular lymph node 5 cm, right internal mammary lymph nodes 1.8 cm 5.Metastatic breast cancer: June 2023:Right supraclavicular lymphadenopathy:Biopsy: Metastatic poorly differentiated adenocarcinoma, ER +40%, PR negative, HER2 positive 3+ PET CT scan6/4/23: Ext Right Supra clav LN along with Internal mammary and Rt Upper Mediastinal and Rt Axillary LN ----------------------------------------------------------------------------------------------------- Current treatment:Enhertustartedon 08/21/2021, today is cycle 4 Enhertu toxicities: 1,.Severe nausea, requiring IV fluids 2.severe diarrhea lasting 3 to 4 days requiring Lomotil and Imodium 3.Severe fatigue 4.Chemotherapy-induced anemia: Monitoring closely  She has been missing work because of toxicities of chemotherapy.  CT CAP 10/25/2021: To my visual examination there appears to be a marked improvement in the lymphadenopathy.  Official report has not been read.  Return to clinic every 3 weeks for [facility]

## 2021-10-27 ENCOUNTER — Inpatient Hospital Stay: Payer: 59

## 2021-10-27 ENCOUNTER — Inpatient Hospital Stay: Payer: 59 | Admitting: Hematology and Oncology

## 2021-10-27 ENCOUNTER — Telehealth: Payer: Self-pay | Admitting: Hematology and Oncology

## 2021-10-27 NOTE — Telephone Encounter (Signed)
Scheduled appointment per 8/24 los. Patient is aware.

## 2021-10-28 ENCOUNTER — Inpatient Hospital Stay: Payer: 59

## 2021-10-28 VITALS — BP 141/62 | HR 76 | Temp 98.1°F | Resp 18

## 2021-10-28 DIAGNOSIS — Z95828 Presence of other vascular implants and grafts: Secondary | ICD-10-CM

## 2021-10-28 DIAGNOSIS — R110 Nausea: Secondary | ICD-10-CM

## 2021-10-28 DIAGNOSIS — C50919 Malignant neoplasm of unspecified site of unspecified female breast: Secondary | ICD-10-CM

## 2021-10-28 DIAGNOSIS — Z5112 Encounter for antineoplastic immunotherapy: Secondary | ICD-10-CM | POA: Diagnosis not present

## 2021-10-28 DIAGNOSIS — C50511 Malignant neoplasm of lower-outer quadrant of right female breast: Secondary | ICD-10-CM

## 2021-10-28 MED ORDER — HEPARIN SOD (PORK) LOCK FLUSH 100 UNIT/ML IV SOLN
500.0000 [IU] | Freq: Once | INTRAVENOUS | Status: AC
  Administered 2021-10-28: 500 [IU]

## 2021-10-28 MED ORDER — SODIUM CHLORIDE 0.9 % IV SOLN: INTRAVENOUS | Status: DC

## 2021-10-28 MED ORDER — SODIUM CHLORIDE 0.9% FLUSH
10.0000 mL | Freq: Once | INTRAVENOUS | Status: AC
  Administered 2021-10-28: 10 mL

## 2021-10-30 ENCOUNTER — Inpatient Hospital Stay: Payer: 59

## 2021-11-04 ENCOUNTER — Other Ambulatory Visit: Payer: Self-pay | Admitting: Hematology and Oncology

## 2021-11-04 DIAGNOSIS — C50511 Malignant neoplasm of lower-outer quadrant of right female breast: Secondary | ICD-10-CM

## 2021-11-08 ENCOUNTER — Telehealth: Payer: Self-pay | Admitting: Hematology and Oncology

## 2021-11-08 NOTE — Telephone Encounter (Signed)
Scheduled appointment per WQ. Call can not be completed at this time. Tried to call the patient twice. [phone]

## 2021-11-14 ENCOUNTER — Telehealth: Payer: Self-pay

## 2021-11-14 MED FILL — Fosaprepitant Dimeglumine For IV Infusion 150 MG (Base Eq): INTRAVENOUS | Qty: 5 | Status: AC

## 2021-11-14 NOTE — Telephone Encounter (Signed)
Notified Patient of completion of FMLA Forms. Awaiting signed Release of Information Form to process request.

## 2021-11-15 ENCOUNTER — Other Ambulatory Visit: Payer: Self-pay | Admitting: *Deleted

## 2021-11-15 ENCOUNTER — Encounter: Payer: Self-pay | Admitting: Adult Health

## 2021-11-15 ENCOUNTER — Inpatient Hospital Stay (HOSPITAL_BASED_OUTPATIENT_CLINIC_OR_DEPARTMENT_OTHER): Payer: 59 | Admitting: Adult Health

## 2021-11-15 ENCOUNTER — Encounter: Payer: Self-pay | Admitting: *Deleted

## 2021-11-15 ENCOUNTER — Other Ambulatory Visit: Payer: Self-pay

## 2021-11-15 ENCOUNTER — Inpatient Hospital Stay: Payer: 59

## 2021-11-15 ENCOUNTER — Inpatient Hospital Stay: Payer: 59 | Attending: Hematology and Oncology

## 2021-11-15 VITALS — BP 127/81 | HR 62 | Resp 16

## 2021-11-15 DIAGNOSIS — E876 Hypokalemia: Secondary | ICD-10-CM

## 2021-11-15 DIAGNOSIS — Z170 Estrogen receptor positive status [ER+]: Secondary | ICD-10-CM | POA: Diagnosis not present

## 2021-11-15 DIAGNOSIS — Z5112 Encounter for antineoplastic immunotherapy: Secondary | ICD-10-CM | POA: Diagnosis present

## 2021-11-15 DIAGNOSIS — Z888 Allergy status to other drugs, medicaments and biological substances status: Secondary | ICD-10-CM | POA: Diagnosis not present

## 2021-11-15 DIAGNOSIS — D6481 Anemia due to antineoplastic chemotherapy: Secondary | ICD-10-CM | POA: Diagnosis not present

## 2021-11-15 DIAGNOSIS — Z885 Allergy status to narcotic agent status: Secondary | ICD-10-CM | POA: Diagnosis not present

## 2021-11-15 DIAGNOSIS — R110 Nausea: Secondary | ICD-10-CM

## 2021-11-15 DIAGNOSIS — G4733 Obstructive sleep apnea (adult) (pediatric): Secondary | ICD-10-CM | POA: Diagnosis not present

## 2021-11-15 DIAGNOSIS — Z7952 Long term (current) use of systemic steroids: Secondary | ICD-10-CM | POA: Diagnosis not present

## 2021-11-15 DIAGNOSIS — C50511 Malignant neoplasm of lower-outer quadrant of right female breast: Secondary | ICD-10-CM

## 2021-11-15 DIAGNOSIS — Z833 Family history of diabetes mellitus: Secondary | ICD-10-CM | POA: Diagnosis not present

## 2021-11-15 DIAGNOSIS — Z95828 Presence of other vascular implants and grafts: Secondary | ICD-10-CM

## 2021-11-15 DIAGNOSIS — K5900 Constipation, unspecified: Secondary | ICD-10-CM | POA: Diagnosis not present

## 2021-11-15 DIAGNOSIS — Z801 Family history of malignant neoplasm of trachea, bronchus and lung: Secondary | ICD-10-CM | POA: Insufficient documentation

## 2021-11-15 DIAGNOSIS — I10 Essential (primary) hypertension: Secondary | ICD-10-CM | POA: Diagnosis not present

## 2021-11-15 DIAGNOSIS — Z8349 Family history of other endocrine, nutritional and metabolic diseases: Secondary | ICD-10-CM | POA: Diagnosis not present

## 2021-11-15 DIAGNOSIS — T451X5A Adverse effect of antineoplastic and immunosuppressive drugs, initial encounter: Secondary | ICD-10-CM | POA: Insufficient documentation

## 2021-11-15 DIAGNOSIS — Z6841 Body Mass Index (BMI) 40.0 and over, adult: Secondary | ICD-10-CM | POA: Insufficient documentation

## 2021-11-15 DIAGNOSIS — Z79899 Other long term (current) drug therapy: Secondary | ICD-10-CM | POA: Insufficient documentation

## 2021-11-15 DIAGNOSIS — Z8719 Personal history of other diseases of the digestive system: Secondary | ICD-10-CM | POA: Diagnosis not present

## 2021-11-15 DIAGNOSIS — R5383 Other fatigue: Secondary | ICD-10-CM | POA: Diagnosis not present

## 2021-11-15 DIAGNOSIS — Z808 Family history of malignant neoplasm of other organs or systems: Secondary | ICD-10-CM | POA: Diagnosis not present

## 2021-11-15 DIAGNOSIS — Z8249 Family history of ischemic heart disease and other diseases of the circulatory system: Secondary | ICD-10-CM | POA: Diagnosis not present

## 2021-11-15 DIAGNOSIS — Z809 Family history of malignant neoplasm, unspecified: Secondary | ICD-10-CM | POA: Insufficient documentation

## 2021-11-15 DIAGNOSIS — Z836 Family history of other diseases of the respiratory system: Secondary | ICD-10-CM | POA: Diagnosis not present

## 2021-11-15 DIAGNOSIS — R197 Diarrhea, unspecified: Secondary | ICD-10-CM | POA: Diagnosis not present

## 2021-11-15 DIAGNOSIS — Z803 Family history of malignant neoplasm of breast: Secondary | ICD-10-CM | POA: Insufficient documentation

## 2021-11-15 DIAGNOSIS — R112 Nausea with vomiting, unspecified: Secondary | ICD-10-CM | POA: Insufficient documentation

## 2021-11-15 DIAGNOSIS — C50919 Malignant neoplasm of unspecified site of unspecified female breast: Secondary | ICD-10-CM

## 2021-11-15 LAB — CBC WITH DIFFERENTIAL (CANCER CENTER ONLY)
Abs Immature Granulocytes: 0.08 10*3/uL — ABNORMAL HIGH (ref 0.00–0.07)
Basophils Absolute: 0.1 10*3/uL (ref 0.0–0.1)
Basophils Relative: 1 %
Eosinophils Absolute: 0.2 10*3/uL (ref 0.0–0.5)
Eosinophils Relative: 4 %
HCT: 30 % — ABNORMAL LOW (ref 36.0–46.0)
Hemoglobin: 9.6 g/dL — ABNORMAL LOW (ref 12.0–15.0)
Immature Granulocytes: 2 %
Lymphocytes Relative: 20 %
Lymphs Abs: 1 10*3/uL (ref 0.7–4.0)
MCH: 28.3 pg (ref 26.0–34.0)
MCHC: 32 g/dL (ref 30.0–36.0)
MCV: 88.5 fL (ref 80.0–100.0)
Monocytes Absolute: 0.8 10*3/uL (ref 0.1–1.0)
Monocytes Relative: 16 %
Neutro Abs: 2.8 10*3/uL (ref 1.7–7.7)
Neutrophils Relative %: 57 %
Platelet Count: 345 10*3/uL (ref 150–400)
RBC: 3.39 MIL/uL — ABNORMAL LOW (ref 3.87–5.11)
RDW: 19.9 % — ABNORMAL HIGH (ref 11.5–15.5)
WBC Count: 4.8 10*3/uL (ref 4.0–10.5)
nRBC: 1 % — ABNORMAL HIGH (ref 0.0–0.2)

## 2021-11-15 LAB — CMP (CANCER CENTER ONLY)
ALT: 14 U/L (ref 0–44)
AST: 26 U/L (ref 15–41)
Albumin: 3.5 g/dL (ref 3.5–5.0)
Alkaline Phosphatase: 66 U/L (ref 38–126)
Anion gap: 5 (ref 5–15)
BUN: 5 mg/dL — ABNORMAL LOW (ref 6–20)
CO2: 33 mmol/L — ABNORMAL HIGH (ref 22–32)
Calcium: 8.7 mg/dL — ABNORMAL LOW (ref 8.9–10.3)
Chloride: 105 mmol/L (ref 98–111)
Creatinine: 0.83 mg/dL (ref 0.44–1.00)
GFR, Estimated: >60 mL/min (ref >60)
Glucose, Bld: 118 mg/dL — ABNORMAL HIGH (ref 70–99)
Potassium: 2.6 mmol/L — CL (ref 3.5–5.1)
Sodium: 143 mmol/L (ref 135–145)
Total Bilirubin: 0.9 mg/dL (ref 0.3–1.2)
Total Protein: 6.2 g/dL — ABNORMAL LOW (ref 6.5–8.1)

## 2021-11-15 MED ORDER — PALONOSETRON HCL INJECTION 0.25 MG/5ML
0.2500 mg | Freq: Once | INTRAVENOUS | Status: AC
  Administered 2021-11-15: 0.25 mg via INTRAVENOUS
  Filled 2021-11-15: qty 5

## 2021-11-15 MED ORDER — DEXAMETHASONE 4 MG PO TABS: 4.0000 mg | ORAL_TABLET | Freq: Two times a day (BID) | ORAL | 1 refills | Status: DC

## 2021-11-15 MED ORDER — POTASSIUM CHLORIDE 10 MEQ/100ML IV SOLN
10.0000 meq | INTRAVENOUS | Status: AC
  Administered 2021-11-15 (×2): 10 meq via INTRAVENOUS
  Filled 2021-11-15 (×2): qty 100

## 2021-11-15 MED ORDER — SODIUM CHLORIDE 0.9% FLUSH
10.0000 mL | INTRAVENOUS | Status: DC | PRN
  Administered 2021-11-15: 10 mL

## 2021-11-15 MED ORDER — FAM-TRASTUZUMAB DERUXTECAN-NXKI CHEMO 100 MG IV SOLR
600.0000 mg | Freq: Once | INTRAVENOUS | Status: AC
  Administered 2021-11-15: 600 mg via INTRAVENOUS
  Filled 2021-11-15: qty 30

## 2021-11-15 MED ORDER — ACETAMINOPHEN 325 MG PO TABS
650.0000 mg | ORAL_TABLET | Freq: Once | ORAL | Status: AC
  Administered 2021-11-15: 650 mg via ORAL
  Filled 2021-11-15: qty 2

## 2021-11-15 MED ORDER — DEXTROSE 5 % IV SOLN: Freq: Once | INTRAVENOUS | Status: AC

## 2021-11-15 MED ORDER — HEPARIN SOD (PORK) LOCK FLUSH 100 UNIT/ML IV SOLN
500.0000 [IU] | Freq: Once | INTRAVENOUS | Status: AC | PRN
  Administered 2021-11-15: 500 [IU]

## 2021-11-15 MED ORDER — SODIUM CHLORIDE 0.9% FLUSH
10.0000 mL | Freq: Once | INTRAVENOUS | Status: AC
  Administered 2021-11-15: 10 mL

## 2021-11-15 MED ORDER — SODIUM CHLORIDE 0.9 % IV SOLN
150.0000 mg | Freq: Once | INTRAVENOUS | Status: AC
  Administered 2021-11-15: 150 mg via INTRAVENOUS
  Filled 2021-11-15: qty 150

## 2021-11-15 NOTE — Assessment & Plan Note (Addendum)
Pamela [name] is here today for f/u of her metastatic breast cancer currently on treatment with Enhertu.  She has no clinical signs of breast cancer progression.  She is managing her diarrhea with imodium relatively well.  We discussed instead of taking two imodium with her first episode of diarrhea, that perhaps one would not cause the constipation she has previously experienced.    [name] will proceed with Enhertu today.  We will see her back in 3 weeks for labs, f/u, and treatment.

## 2021-11-15 NOTE — Progress Notes (Signed)
CRITICAL VALUE STICKER  CRITICAL VALUE: K 2.6  RECEIVER (on-site recipient of call): Merleen Nicely, Adrian NOTIFIED: 11/15/21 at 1100  MD NOTIFIED: Wilber Bihari, DNP  TIME OF NOTIFICATION: 11/15/21 at 1101  RESPONSE:  NP notified and verbal orders receive for pt to receive 20 mEq IV potassium today along with tx.  Orders placed under sign and held.

## 2021-11-15 NOTE — Patient Instructions (Addendum)
[facility] ONCOLOGY  Discharge Instructions: Thank you for choosing [facility] to provide your oncology and hematology care.   If you have a lab appointment with the [facility], please go directly to the [facility] and check in at the registration area.   Wear comfortable clothing and clothing appropriate for easy access to any Portacath or PICC line.   We strive to give you quality time with your provider. You may need to reschedule your appointment if you arrive late (15 or more minutes).  Arriving late affects you and other patients whose appointments are after yours.  Also, if you miss three or more appointments without notifying the office, you may be dismissed from the clinic at the provider's discretion.      For prescription refill requests, have your pharmacy contact our office and allow 72 hours for refills to be completed.    Today you received the following chemotherapy and/or immunotherapy agents: Enhertu.       To help prevent nausea and vomiting after your treatment, we encourage you to take your nausea medication as directed.  BELOW ARE SYMPTOMS THAT SHOULD BE REPORTED IMMEDIATELY: *FEVER GREATER THAN 100.4 F (38 C) OR HIGHER *CHILLS OR SWEATING *NAUSEA AND VOMITING THAT IS NOT CONTROLLED WITH YOUR NAUSEA MEDICATION *UNUSUAL SHORTNESS OF BREATH *UNUSUAL BRUISING OR BLEEDING *URINARY PROBLEMS (pain or burning when urinating, or frequent urination) *BOWEL PROBLEMS (unusual diarrhea, constipation, pain near the anus) TENDERNESS IN MOUTH AND THROAT WITH OR WITHOUT PRESENCE OF ULCERS (sore throat, sores in mouth, or a toothache) UNUSUAL RASH, SWELLING OR PAIN  UNUSUAL VAGINAL DISCHARGE OR ITCHING   Items with * indicate a potential emergency and should be followed up as soon as possible or go to the Emergency Department if any problems should occur.  Please show the CHEMOTHERAPY ALERT CARD or IMMUNOTHERAPY ALERT CARD at check-in to  the Emergency Department and triage nurse.  Should you have questions after your visit or need to cancel or reschedule your appointment, please contact [facility]  Dept: [phone]  and follow the prompts.  Office hours are 8:00 a.m. to 4:30 p.m. Monday - Friday. Please note that voicemails left after 4:00 p.m. may not be returned until the following business day.  We are closed weekends and major holidays. You have access to a nurse at all times for urgent questions. Please call the main number to the clinic Dept: [phone] and follow the prompts.   For any non-urgent questions, you may also contact your provider using MyChart. We now offer e-Visits for anyone 1 and older to request care online for non-urgent symptoms. For details visit [email or web address].   Also download the MyChart app! Go to the app store, search "MyChart", open the app, select [facility], and log in with your MyChart username and password.  Masks are optional in the cancer centers. If you would like for your care team to wear a mask while they are taking care of you, please let them know. You may have one support person who is at least 55 years old accompany you for your appointments. Potassium Chloride Injection What is this medication? POTASSIUM CHLORIDE (poe TASS i um KLOOR ide) prevents and treats low levels of potassium in your body. Potassium plays an important role in maintaining the health of your kidneys, heart, muscles, and nervous system. This medicine may be used for other purposes; ask your health care provider or pharmacist if you have questions.  COMMON BRAND NAME(S): PROAMP What should I tell my care team before I take this medication? They need to know if you have any of these conditions: Addison disease Dehydration Diabetes (high blood sugar) Heart disease High levels of potassium in the blood Irregular heartbeat or rhythm Kidney disease Large areas of burned  skin An unusual or allergic reaction to potassium, other medications, foods, dyes, or preservatives Pregnant or trying to get pregnant Breast-feeding How should I use this medication? This medication is injected into a vein. It is given in a hospital or clinic setting. Talk to your care team about the use of this medication in children. Special care may be needed. Overdosage: If you think you have taken too much of this medicine contact a poison control center or emergency room at once. NOTE: This medicine is only for you. Do not share this medicine with others. What if I miss a dose? This does not apply. This medication is not for regular use. What may interact with this medication? Do not take this medication with any of the following: Certain diuretics such as spironolactone, triamterene Eplerenone Sodium polystyrene sulfonate This medication may also interact with the following: Certain medications for blood pressure or heart disease like lisinopril, losartan, quinapril, valsartan Medications that lower your chance of fighting infection such as cyclosporine, tacrolimus NSAIDs, medications for pain and inflammation, like ibuprofen or naproxen Other potassium supplements Salt substitutes This list may not describe all possible interactions. Give your health care provider a list of all the medicines, herbs, non-prescription drugs, or dietary supplements you use. Also tell them if you smoke, drink alcohol, or use illegal drugs. Some items may interact with your medicine. What should I watch for while using this medication? Visit your care team for regular checks on your progress. Tell your care team if your symptoms do not start to get better or if they get worse. You may need blood work while you are taking this medication. Avoid salt substitutes unless you are told otherwise by your care team. What side effects may I notice from receiving this medication? Side effects that you should  report to your care team as soon as possible: Allergic reactions--skin rash, itching, hives, swelling of the face, lips, tongue, or throat High potassium level--muscle weakness, fast or irregular heartbeat Side effects that usually do not require medical attention (report to your care team if they continue or are bothersome): Diarrhea Nausea Stomach pain Vomiting This list may not describe all possible side effects. Call your doctor for medical advice about side effects. You may report side effects to FDA at 1-800-FDA-1088. Where should I keep my medication? This medication is given in a hospital or clinic. It will not be stored at home. NOTE: This sheet is a summary. It may not cover all possible information. If you have questions about this medicine, talk to your doctor, pharmacist, or health care provider.  2023 Elsevier/Gold Standard (2020-06-02 00:00:00)

## 2021-11-15 NOTE — Progress Notes (Signed)
Pamela [name] Follow up:    [name], [address] [place] [address]   DIAGNOSIS:  [name] Staging  Malignant neoplasm of lower-outer quadrant of right breast of female, estrogen receptor positive ([place]) Staging form: Breast, AJCC 8th Edition - Clinical stage from 05/11/2020: Stage IA (cT1b, cN0, cM0, G3, ER+, PR-, HER2+) - Signed by Nicholas Lose, MD on 05/11/2020 Stage prefix: Initial diagnosis - Pathologic stage from 06/02/2020: Stage IA (pT1c, pN0, cM0, G3, ER+, PR-, HER2+) - Signed by Gardenia Phlegm, NP on 06/15/2020 Stage prefix: Initial diagnosis Histologic grading system: 3 grade system   SUMMARY OF ONCOLOGIC HISTORY: Oncology History  Malignant neoplasm of lower-outer quadrant of right breast of female, estrogen receptor positive ([place])  05/03/2020 Initial Diagnosis   Screening mammogram showed a 1.0cm mass at the 6 o'clock position in the right breast. Diagnostic mammogram and US showed the 1.2cm mass at the 6 o'clock position in the right breast. Biopsy showed IDC, grade 3, HER-2 positive (3+), ER 15% weak, PR-, Ki67 80%.   05/11/2020 [name] Staging   Staging form: Breast, AJCC 8th Edition - Clinical stage from 05/11/2020: Stage IA (cT1b, cN0, cM0, G3, ER+, PR-, HER2+) - Signed by Nicholas Lose, MD on 05/11/2020 Stage prefix: Initial diagnosis   05/19/2020 Genetic Testing   Negative hereditary [name] genetic testing: no pathogenic variants detected in [facility] Breast STAT Panel and Common Hereditary Cancers Panel.  The report dates are May 19, 2020 (STAT) and May 23, 2020 (Common Hereditary).   The Common Hereditary Cancers Panel offered by [facility] includes sequencing and/or deletion duplication testing of the following 47 genes: APC, ATM, AXIN2, BARD1, BMPR1A, BRCA1, BRCA2, BRIP1, CDH1, CDK4, CDKN2A (p14ARF), CDKN2A (p16INK4a), CHEK2, CTNNA1, DICER1, EPCAM (Deletion/duplication testing only), GREM1 (promoter region  deletion/duplication testing only), GREM1, HOXB13, KIT, MEN1, MLH1, MSH2, MSH3, MSH6, MUTYH, NBN, NF1, NHTL1, PALB2, PDGFRA, PMS2, POLD1, POLE, PTEN, RAD50, RAD51C, RAD51D, SDHA, SDHB, SDHC, SDHD, SMAD4, SMARCA4. STK11, TP53, TSC1, TSC2, and VHL.  The following genes were evaluated for sequence changes only: SDHA and HOXB13 c.251G>A variant only.es only: SDHA and HOXB13 c.251G>A variant only.   06/02/2020 Surgery   Right lumpectomy ([facility]): invasive and in situ ductal carcinoma, 1.3cm, clear margins, 1 right axillary lymph node negative for carcinoma.   06/02/2020 [name] Staging   Staging form: Breast, AJCC 8th Edition - Pathologic stage from 06/02/2020: Stage IA (pT1c, pN0, cM0, G3, ER+, PR-, HER2+) - Signed by Gardenia Phlegm, NP on 06/15/2020 Stage prefix: Initial diagnosis Histologic grading system: 3 grade system   07/08/2020 - 10/21/2020 Chemotherapy   Taxol Herceptin followed by Herceptin maintenance   11/11/2020 -  Chemotherapy   Herceptin maintenance       04/14/2021 -  Anti-estrogen oral therapy   Tamoxifen 10 mg   08/21/2021 - 10/26/2021 Chemotherapy   Patient is on Treatment Plan : BREAST METASTATIC fam-trastuzumab deruxtecan-nxki (Enhertu) q21d     11/15/2021 -  Chemotherapy   Patient is on Treatment Plan : BREAST METASTATIC Fam-Trastuzumab Deruxtecan-nxki (Enhertu) (5.4) q21d       CURRENT THERAPY: Enhertu  INTERVAL HISTORY: Pennelope [name] 56 y.o. female returns for f/u prior to receiving Enhertu.  Her most recent echocardiogram occurred on 09/21/2021 and showed a normal EF of 60-65%.   She is experiencing continued fatigue that is moderate along with some nausea.  She has only vomited twice.  She is having diarrhea, and is drinking pedialyte and experiences diarrhea about 5-6 times a day  which is impacted by the types of food she is eating. She is drinking liquid imodium and it works, however one time it made her constipated x 2 days and led to hemorrhoids so she  is cautious how much of the imodium she takes.   Her most recent scans on 10/27/2021 showed a significant decrease in her [name] and no evidence of new or progressive metastatic disease.     Patient Active Problem List   Diagnosis Date Noted   Breast [name] ([facility]) 08/25/2021   Nausea without vomiting 08/25/2021   Prediabetes 03/13/2021   Non-intractable vomiting 07/13/2020   Port-A-Cath in place 07/08/2020   Genetic testing 05/20/2020   Family history of breast [name] 05/12/2020   Family history of lung [name] 05/12/2020   Malignant neoplasm of lower-outer quadrant of right breast of female, estrogen receptor positive ([place]) 05/03/2020   Elevated troponin level not due myocardial infarction 09/18/2019   Mixed hyperlipidemia 09/18/2019   Hypertensive emergency 09/17/2019   Primary osteoarthritis of right hip 08/06/2018   Seasonal allergies 06/12/2018   Fibrocystic disease of breast 05/27/2018   Obstructive sleep apnea syndrome, moderate 10/27/2013   Shift work sleep disorder 10/27/2013   Psychic factors associated with diseases classified elsewhere 08/07/2013   High cholesterol 07/21/2013   Essential hypertension 07/21/2013   Snoring 07/21/2013   IUD contraception-inserted 10/01/11 10/01/2011   Class 3 severe obesity due to excess calories with serious comorbidity and body mass index (BMI) of 45.0 to 49.9 in adult ([place]) 09/03/2011    is allergic to pollen extract, compazine [prochlorperazine], latex, codeine, shellfish allergy, and betadine [povidone iodine].  MEDICAL HISTORY: Past Medical History:  Diagnosis Date   Anxiety    Asthma    Back pain    Breast [name] ([place])    Constipation    DUB (dysfunctional uterine bleeding) 2007   Edema of both lower extremities    Elevated cholesterol    Family history of breast [name] 05/12/2020   Family history of lung [name] 05/12/2020   Food allergy    H/O menorrhagia 05/01/2006   High cholesterol    History of ovarian cyst 2007    Hypertension 03/31/2005   Increased BMI 07/11/2006   Joint pain    Migraine    N&V (nausea and vomiting)    Obesity 2007   Obstructive sleep apnea syndrome, moderate 10/27/2013   no cpap   Sleep apnea    SOB (shortness of breath)    Vitamin D deficiency    Vitamin D deficiency    Weight loss 07/11/2006    SURGICAL HISTORY: Past Surgical History:  Procedure Laterality Date   BREAST LUMPECTOMY WITH RADIOACTIVE SEED AND SENTINEL LYMPH NODE BIOPSY Right 06/02/2020   Procedure: RIGHT BREAST LUMPECTOMY WITH RADIOACTIVE SEED AND RIGHT SENTINEL LYMPH NODE BIOPSY;  Surgeon: [name], MD;  Location: [facility];  Service: General;  Laterality: Right;   BREAST REDUCTION SURGERY  05/1991   CESAREAN SECTION     HYSTEROSCOPY  05/01/2006   PORTACATH PLACEMENT Right 06/02/2020   Procedure: INSERTION PORT-A-CATH;  Surgeon: [name], MD;  Location: [facility];  Service: General;  Laterality: Right;    SOCIAL HISTORY: Social History   Socioeconomic History   Marital status: Single    Spouse name: Not on file   Number of children: Not on file   Years of education: Not on file   Highest education level: Not on file  Occupational History   Occupation: Education officer, museum  Tobacco Use  Smoking status: Never   Smokeless tobacco: Never  Vaping Use   Vaping Use: Never used  Substance and Sexual Activity   Alcohol use: No   Drug use: Not Currently    Frequency: 2.0 times per week   Sexual activity: Not Currently    Birth control/protection: I.U.D.    Comment: Mirena  Other Topics Concern   Not on file  Social History Narrative   Not on file   Social Determinants of Health   Financial Resource Strain: Not on file  Food Insecurity: Not on file  Transportation Needs: Not on file  Physical Activity: Not on file  Stress: Not on file  Social Connections: Not on file  Intimate Partner Violence: Not on file    FAMILY HISTORY: Family History  Problem  Relation Age of Onset   Hypertension Mother    High Cholesterol Mother    Thyroid disease Mother    [name] Mother    Sleep apnea Mother    Hypertension Father    Heart attack Father    Sudden death Father    Diabetes Maternal Grandmother    Bone [name] Maternal Grandfather        dx after 34   Breast [name] Maternal Aunt        dx 24s   Lung [name] Maternal Aunt        dx after 50    Review of Systems  Constitutional:  Positive for fatigue. Negative for appetite change, chills, fever and unexpected weight change.  HENT:   Negative for hearing loss, lump/mass and trouble swallowing.   Eyes:  Negative for eye problems and icterus.  Respiratory:  Negative for chest tightness, cough and shortness of breath.   Cardiovascular:  Negative for chest pain, leg swelling and palpitations.  Gastrointestinal:  Positive for constipation, diarrhea and nausea. Negative for abdominal distention, abdominal pain and vomiting.  Endocrine: Negative for hot flashes.  Genitourinary:  Negative for difficulty urinating.   Musculoskeletal:  Negative for arthralgias.  Skin:  Negative for itching and rash.  Neurological:  Negative for dizziness, extremity weakness, headaches and numbness.  Hematological:  Negative for adenopathy. Does not bruise/bleed easily.  Psychiatric/Behavioral:  Negative for depression. The patient is not nervous/anxious.       PHYSICAL EXAMINATION  ECOG PERFORMANCE STATUS: 1 - Symptomatic but completely ambulatory  Vitals:   11/15/21 1019  BP: 135/75  Pulse: 84  Resp: 16  Temp: 98.4 F (36.9 C)  SpO2: 100%    Physical Exam Constitutional:      General: She is not in acute distress.    Appearance: Normal appearance. She is not toxic-appearing.  HENT:     Head: Normocephalic and atraumatic.  Eyes:     General: No scleral icterus. Cardiovascular:     Rate and Rhythm: Normal rate and regular rhythm.     Pulses: Normal pulses.     Heart sounds: Normal heart sounds.   Pulmonary:     Effort: Pulmonary effort is normal.     Breath sounds: Normal breath sounds.  Abdominal:     General: Abdomen is flat. Bowel sounds are normal. There is no distension.     Palpations: Abdomen is soft.     Tenderness: There is no abdominal tenderness.  Musculoskeletal:        General: No swelling.     Cervical back: Neck supple.  Lymphadenopathy:     Cervical: No cervical adenopathy.  Skin:    General: Skin is warm and dry.  Findings: No rash.  Neurological:     General: No focal deficit present.     Mental Status: She is alert.  Psychiatric:        Mood and Affect: Mood normal.        Behavior: Behavior normal.     LABORATORY DATA:  CBC    Component Value Date/Time   WBC 3.3 (L) 11/16/2021 1630   WBC 6.9 07/21/2021 1143   RBC 3.34 (L) 11/16/2021 1630   HGB 9.6 (L) 11/16/2021 1630   HGB 13.2 09/22/2019 1040   HCT 29.8 (L) 11/16/2021 1630   HCT 41.3 09/22/2019 1040   PLT 353 11/16/2021 1630   PLT 247 09/22/2019 1040   MCV 89.2 11/16/2021 1630   MCV 83 09/22/2019 1040   MCH 28.7 11/16/2021 1630   MCHC 32.2 11/16/2021 1630   RDW 20.0 (H) 11/16/2021 1630   RDW 14.7 09/22/2019 1040   LYMPHSABS 0.4 (L) 11/16/2021 1630   MONOABS 0.3 11/16/2021 1630   EOSABS 0.1 11/16/2021 1630   BASOSABS 0.0 11/16/2021 1630    CMP     Component Value Date/Time   NA 144 11/16/2021 1630   NA 142 09/22/2019 1040   K 3.4 (L) 11/16/2021 1630   CL 107 11/16/2021 1630   CO2 29 11/16/2021 1630   GLUCOSE 118 (H) 11/16/2021 1630   BUN 8 11/16/2021 1630   BUN 11 09/22/2019 1040   CREATININE 0.79 11/16/2021 1630   CALCIUM 8.6 (L) 11/16/2021 1630   PROT 6.8 11/16/2021 1630   PROT 6.7 06/17/2019 1054   ALBUMIN 3.3 (L) 11/16/2021 1630   ALBUMIN 4.5 06/17/2019 1054   AST 35 11/16/2021 1630   ALT 18 11/16/2021 1630   ALKPHOS 63 11/16/2021 1630   BILITOT 0.9 11/16/2021 1630   GFRNONAA >60 11/16/2021 1630   GFRAA 77 09/22/2019 1040      ASSESSMENT and THERAPY  PLAN:   Malignant neoplasm of lower-outer quadrant of right breast of female, estrogen receptor positive ([facility]) Pamela [name] is here today for f/u of her metastatic breast [name] currently on treatment with Enhertu.  She has no clinical signs of breast [name] progression.  She is managing her diarrhea with imodium relatively well.  We discussed instead of taking two imodium with her first episode of diarrhea, that perhaps one would not cause the constipation she has previously experienced.    Daaiyah will proceed with Enhertu today.  We will see her back in 3 weeks for labs, f/u, and treatment.      All questions were answered. The patient knows to call the clinic with any problems, questions or concerns. We can certainly see the patient much sooner if necessary.  Total encounter time:20 minutes*in face-to-face visit time, chart review, lab review, care coordination, order entry, and documentation of the encounter time.  Wilber Bihari, NP 11/19/21 7:43 PM Medical Oncology and Hematology [facility] [place], [place] [address] Tel. [phone]    Fax. [phone]  *Total Encounter Time as defined by the Centers for Medicare and Medicaid Services includes, in addition to the face-to-face time of a patient visit (documented in the note above) non-face-to-face time: obtaining and reviewing outside history, ordering and reviewing medications, tests or procedures, care coordination (communications with other health care professionals or caregivers) and documentation in the medical record.

## 2021-11-15 NOTE — Patient Instructions (Signed)

## 2021-11-15 NOTE — Progress Notes (Signed)
Continue Enhertu 4.3 mg/kg today. Infuse over 30 minutes.  Raul Del [place], [place], BCOP, BCPS 11/15/2021 11:35 AM

## 2021-11-15 NOTE — Progress Notes (Signed)
LC plans to keep dose reduction at 4.'3mg'$ /kg

## 2021-11-16 ENCOUNTER — Inpatient Hospital Stay: Payer: 59

## 2021-11-16 ENCOUNTER — Other Ambulatory Visit: Payer: Self-pay

## 2021-11-16 ENCOUNTER — Telehealth: Payer: Self-pay | Admitting: Hematology and Oncology

## 2021-11-16 DIAGNOSIS — C50511 Malignant neoplasm of lower-outer quadrant of right female breast: Secondary | ICD-10-CM

## 2021-11-16 DIAGNOSIS — Z5112 Encounter for antineoplastic immunotherapy: Secondary | ICD-10-CM | POA: Diagnosis not present

## 2021-11-16 DIAGNOSIS — Z95828 Presence of other vascular implants and grafts: Secondary | ICD-10-CM

## 2021-11-16 DIAGNOSIS — R110 Nausea: Secondary | ICD-10-CM

## 2021-11-16 DIAGNOSIS — C50919 Malignant neoplasm of unspecified site of unspecified female breast: Secondary | ICD-10-CM

## 2021-11-16 LAB — CBC WITH DIFFERENTIAL (CANCER CENTER ONLY)
Abs Immature Granulocytes: 0.02 10*3/uL (ref 0.00–0.07)
Basophils Absolute: 0 10*3/uL (ref 0.0–0.1)
Basophils Relative: 1 %
Eosinophils Absolute: 0.1 10*3/uL (ref 0.0–0.5)
Eosinophils Relative: 2 %
HCT: 29.8 % — ABNORMAL LOW (ref 36.0–46.0)
Hemoglobin: 9.6 g/dL — ABNORMAL LOW (ref 12.0–15.0)
Immature Granulocytes: 1 %
Lymphocytes Relative: 11 %
Lymphs Abs: 0.4 10*3/uL — ABNORMAL LOW (ref 0.7–4.0)
MCH: 28.7 pg (ref 26.0–34.0)
MCHC: 32.2 g/dL (ref 30.0–36.0)
MCV: 89.2 fL (ref 80.0–100.0)
Monocytes Absolute: 0.3 10*3/uL (ref 0.1–1.0)
Monocytes Relative: 8 %
Neutro Abs: 2.6 10*3/uL (ref 1.7–7.7)
Neutrophils Relative %: 77 %
Platelet Count: 353 10*3/uL (ref 150–400)
RBC: 3.34 MIL/uL — ABNORMAL LOW (ref 3.87–5.11)
RDW: 20 % — ABNORMAL HIGH (ref 11.5–15.5)
WBC Count: 3.3 10*3/uL — ABNORMAL LOW (ref 4.0–10.5)
nRBC: 0 % (ref 0.0–0.2)

## 2021-11-16 LAB — CMP (CANCER CENTER ONLY)
ALT: 18 U/L (ref 0–44)
AST: 35 U/L (ref 15–41)
Albumin: 3.3 g/dL — ABNORMAL LOW (ref 3.5–5.0)
Alkaline Phosphatase: 63 U/L (ref 38–126)
Anion gap: 8 (ref 5–15)
BUN: 8 mg/dL (ref 6–20)
CO2: 29 mmol/L (ref 22–32)
Calcium: 8.6 mg/dL — ABNORMAL LOW (ref 8.9–10.3)
Chloride: 107 mmol/L (ref 98–111)
Creatinine: 0.79 mg/dL (ref 0.44–1.00)
GFR, Estimated: >60 mL/min (ref >60)
Glucose, Bld: 118 mg/dL — ABNORMAL HIGH (ref 70–99)
Potassium: 3.4 mmol/L — ABNORMAL LOW (ref 3.5–5.1)
Sodium: 144 mmol/L (ref 135–145)
Total Bilirubin: 0.9 mg/dL (ref 0.3–1.2)
Total Protein: 6.8 g/dL (ref 6.5–8.1)

## 2021-11-16 MED ORDER — SODIUM CHLORIDE 0.9% FLUSH
10.0000 mL | Freq: Once | INTRAVENOUS | Status: AC
  Administered 2021-11-16: 10 mL

## 2021-11-16 MED ORDER — HEPARIN SOD (PORK) LOCK FLUSH 100 UNIT/ML IV SOLN
500.0000 [IU] | Freq: Once | INTRAVENOUS | Status: AC
  Administered 2021-11-16: 500 [IU]

## 2021-11-16 NOTE — Telephone Encounter (Signed)
Talked to patient to confirm lab appointment before 9/15 appointment

## 2021-11-17 ENCOUNTER — Inpatient Hospital Stay: Payer: 59

## 2021-11-17 VITALS — BP 128/81 | HR 83 | Resp 17

## 2021-11-17 DIAGNOSIS — Z95828 Presence of other vascular implants and grafts: Secondary | ICD-10-CM

## 2021-11-17 DIAGNOSIS — C50919 Malignant neoplasm of unspecified site of unspecified female breast: Secondary | ICD-10-CM

## 2021-11-17 DIAGNOSIS — Z5112 Encounter for antineoplastic immunotherapy: Secondary | ICD-10-CM | POA: Diagnosis not present

## 2021-11-17 DIAGNOSIS — Z170 Estrogen receptor positive status [ER+]: Secondary | ICD-10-CM

## 2021-11-17 DIAGNOSIS — R110 Nausea: Secondary | ICD-10-CM

## 2021-11-17 MED ORDER — CYANOCOBALAMIN 1000 MCG/ML IJ SOLN
1000.0000 ug | Freq: Once | INTRAMUSCULAR | Status: AC
  Administered 2021-11-17: 1000 ug via INTRAMUSCULAR
  Filled 2021-11-17: qty 1

## 2021-11-17 MED ORDER — SODIUM CHLORIDE 0.9% FLUSH
10.0000 mL | Freq: Once | INTRAVENOUS | Status: AC
  Administered 2021-11-17: 10 mL

## 2021-11-17 MED ORDER — SODIUM CHLORIDE 0.9 % IV SOLN: INTRAVENOUS | Status: DC

## 2021-11-17 MED ORDER — HEPARIN SOD (PORK) LOCK FLUSH 100 UNIT/ML IV SOLN
500.0000 [IU] | Freq: Once | INTRAVENOUS | Status: AC
  Administered 2021-11-17: 500 [IU]

## 2021-11-18 ENCOUNTER — Inpatient Hospital Stay: Payer: 59

## 2021-11-18 DIAGNOSIS — Z5112 Encounter for antineoplastic immunotherapy: Secondary | ICD-10-CM | POA: Diagnosis not present

## 2021-11-18 DIAGNOSIS — C50919 Malignant neoplasm of unspecified site of unspecified female breast: Secondary | ICD-10-CM

## 2021-11-18 MED ORDER — SODIUM CHLORIDE 0.9 % IV SOLN: Freq: Once | INTRAVENOUS | Status: AC

## 2021-11-18 NOTE — Patient Instructions (Signed)
Rehydration, Adult Rehydration is the replacement of body fluids, salts, and minerals (electrolytes) that are lost during dehydration. Dehydration is when there is not enough water or other fluids in the body. This happens when you lose more fluids than you take in. Common causes of dehydration include: Not drinking enough fluids. This can occur when you are ill or doing activities that require a lot of energy, especially in hot weather. Conditions that cause loss of water or other fluids, such as diarrhea, vomiting, sweating, or urinating a lot. Other illnesses, such as fever or infection. Certain medicines, such as those that remove excess fluid from the body (diuretics). Symptoms of mild or moderate dehydration may include thirst, dry lips and mouth, and dizziness. Symptoms of severe dehydration may include increased heart rate, confusion, fainting, and not urinating. For severe dehydration, you may need to get fluids through an IV at the hospital. For mild or moderate dehydration, you can usually rehydrate at home by drinking certain fluids as told by your health care provider. What are the risks? Generally, rehydration is safe. However, taking in too much fluid (overhydration) can be a problem. This is rare. Overhydration can cause an electrolyte imbalance, kidney failure, or a decrease in salt (sodium) levels in the body. Supplies needed You will need an oral rehydration solution (ORS) if your health care provider tells you to use one. This is a drink to treat dehydration. It can be found in pharmacies and retail stores. How to rehydrate Fluids Follow instructions from your health care provider for rehydration. The kind of fluid and the amount you should drink depend on your condition. In general, you should choose drinks that you prefer. If told by your health care provider, drink an ORS. Make an ORS by following instructions on the package. Start by drinking small amounts, about  cup (120  mL) every 5-10 minutes. Slowly increase how much you drink until you have taken the amount recommended by your health care provider. Drink enough clear fluids to keep your urine pale yellow. If you were told to drink an ORS, finish it first, then start slowly drinking other clear fluids. Drink fluids such as: Water. This includes sparkling water and flavored water. Drinking only water can lead to having too little sodium in your body (hyponatremia). Follow the advice of your health care provider. Water from ice chips you suck on. Fruit juice with water you add to it (diluted). Sports drinks. Hot or cold herbal teas. Broth-based soups. Milk or milk products. Food Follow instructions from your health care provider about what to eat while you rehydrate. Your health care provider may recommend that you slowly begin eating regular foods in small amounts. Eat foods that contain a healthy balance of electrolytes, such as bananas, oranges, potatoes, tomatoes, and spinach. Avoid foods that are greasy or contain a lot of sugar. In some cases, you may get nutrition through a feeding tube that is passed through your nose and into your stomach (nasogastric tube, or NG tube). This may be done if you have uncontrolled vomiting or diarrhea. Beverages to avoid  Certain beverages may make dehydration worse. While you rehydrate, avoid drinking alcohol. How to tell if you are recovering from dehydration You may be recovering from dehydration if: You are urinating more often than before you started rehydrating. Your urine is pale yellow. Your energy level improves. You vomit less frequently. You have diarrhea less frequently. Your appetite improves or returns to normal. You feel less dizzy or less light-headed.   Your skin tone and color start to look more normal. Follow these instructions at home: Take over-the-counter and prescription medicines only as told by your health care provider. Do not take sodium  tablets. Doing this can lead to having too much sodium in your body (hypernatremia). Contact a health care provider if: You continue to have symptoms of mild or moderate dehydration, such as: Thirst. Dry lips. Slightly dry mouth. Dizziness. Dark urine or less urine than normal. Muscle cramps. You continue to vomit or have diarrhea. Get help right away if you: Have symptoms of dehydration that get worse. Have a fever. Have a severe headache. Have been vomiting and the following happens: Your vomiting gets worse or does not go away. Your vomit includes blood or green matter (bile). You cannot eat or drink without vomiting. Have problems with urination or bowel movements, such as: Diarrhea that gets worse or does not go away. Blood in your stool (feces). This may cause stool to look black and tarry. Not urinating, or urinating only a small amount of very dark urine, within 6-8 hours. Have trouble breathing. Have symptoms that get worse with treatment. These symptoms may represent a serious problem that is an emergency. Do not wait to see if the symptoms will go away. Get medical help right away. Call your local emergency services (911 in the [place] Do not drive yourself to the hospital. Summary Rehydration is the replacement of body fluids and minerals (electrolytes) that are lost during dehydration. Follow instructions from your health care provider for rehydration. The kind of fluid and amount you should drink depend on your condition. Slowly increase how much you drink until you have taken the amount recommended by your health care provider. Contact your health care provider if you continue to show signs of mild or moderate dehydration. This information is not intended to replace advice given to you by your health care provider. Make sure you discuss any questions you have with your health care provider. Document Revised: 04/22/2019 Document Reviewed: 03/02/2019 Elsevier Patient  Education  2023 [employer]  

## 2021-11-19 ENCOUNTER — Encounter: Payer: Self-pay | Admitting: Hematology and Oncology

## 2021-11-26 ENCOUNTER — Other Ambulatory Visit: Payer: Self-pay

## 2021-11-26 ENCOUNTER — Emergency Department (HOSPITAL_COMMUNITY): Payer: 59

## 2021-11-26 ENCOUNTER — Observation Stay (HOSPITAL_COMMUNITY)
Admission: EM | Admit: 2021-11-26 | Discharge: 2021-11-28 | Disposition: A | Discharge status: Home / Self Care | Payer: 59 | Attending: Family Medicine | Admitting: Family Medicine

## 2021-11-26 ENCOUNTER — Encounter (HOSPITAL_COMMUNITY): Payer: Self-pay | Admitting: Oncology

## 2021-11-26 DIAGNOSIS — Z170 Estrogen receptor positive status [ER+]: Secondary | ICD-10-CM | POA: Diagnosis not present

## 2021-11-26 DIAGNOSIS — Z79899 Other long term (current) drug therapy: Secondary | ICD-10-CM | POA: Diagnosis not present

## 2021-11-26 DIAGNOSIS — J45909 Unspecified asthma, uncomplicated: Secondary | ICD-10-CM | POA: Diagnosis not present

## 2021-11-26 DIAGNOSIS — R42 Dizziness and giddiness: Secondary | ICD-10-CM | POA: Diagnosis not present

## 2021-11-26 DIAGNOSIS — Z91040 Latex allergy status: Secondary | ICD-10-CM | POA: Diagnosis not present

## 2021-11-26 DIAGNOSIS — C50511 Malignant neoplasm of lower-outer quadrant of right female breast: Secondary | ICD-10-CM | POA: Diagnosis not present

## 2021-11-26 DIAGNOSIS — C50919 Malignant neoplasm of unspecified site of unspecified female breast: Secondary | ICD-10-CM | POA: Diagnosis present

## 2021-11-26 DIAGNOSIS — K224 Dyskinesia of esophagus: Secondary | ICD-10-CM | POA: Insufficient documentation

## 2021-11-26 DIAGNOSIS — E869 Volume depletion, unspecified: Secondary | ICD-10-CM | POA: Insufficient documentation

## 2021-11-26 DIAGNOSIS — Z6841 Body Mass Index (BMI) 40.0 and over, adult: Secondary | ICD-10-CM | POA: Diagnosis not present

## 2021-11-26 DIAGNOSIS — I10 Essential (primary) hypertension: Secondary | ICD-10-CM | POA: Diagnosis present

## 2021-11-26 DIAGNOSIS — E782 Mixed hyperlipidemia: Secondary | ICD-10-CM | POA: Diagnosis not present

## 2021-11-26 DIAGNOSIS — R7303 Prediabetes: Secondary | ICD-10-CM | POA: Diagnosis present

## 2021-11-26 DIAGNOSIS — Z20822 Contact with and (suspected) exposure to covid-19: Secondary | ICD-10-CM | POA: Diagnosis not present

## 2021-11-26 DIAGNOSIS — R55 Syncope and collapse: Secondary | ICD-10-CM | POA: Diagnosis present

## 2021-11-26 DIAGNOSIS — E876 Hypokalemia: Secondary | ICD-10-CM | POA: Diagnosis present

## 2021-11-26 DIAGNOSIS — Z7982 Long term (current) use of aspirin: Secondary | ICD-10-CM | POA: Insufficient documentation

## 2021-11-26 DIAGNOSIS — G4733 Obstructive sleep apnea (adult) (pediatric): Secondary | ICD-10-CM | POA: Diagnosis present

## 2021-11-26 LAB — CBC WITH DIFFERENTIAL/PLATELET
Abs Immature Granulocytes: 0.04 10*3/uL (ref 0.00–0.07)
Basophils Absolute: 0 10*3/uL (ref 0.0–0.1)
Basophils Relative: 1 %
Eosinophils Absolute: 0.1 10*3/uL (ref 0.0–0.5)
Eosinophils Relative: 2 %
HCT: 30.7 % — ABNORMAL LOW (ref 36.0–46.0)
Hemoglobin: 10 g/dL — ABNORMAL LOW (ref 12.0–15.0)
Immature Granulocytes: 1 %
Lymphocytes Relative: 15 %
Lymphs Abs: 0.7 10*3/uL (ref 0.7–4.0)
MCH: 29.3 pg (ref 26.0–34.0)
MCHC: 32.6 g/dL (ref 30.0–36.0)
MCV: 90 fL (ref 80.0–100.0)
Monocytes Absolute: 0.4 10*3/uL (ref 0.1–1.0)
Monocytes Relative: 9 %
Neutro Abs: 3.6 10*3/uL (ref 1.7–7.7)
Neutrophils Relative %: 72 %
Platelets: 217 10*3/uL (ref 150–400)
RBC: 3.41 MIL/uL — ABNORMAL LOW (ref 3.87–5.11)
RDW: 17.8 % — ABNORMAL HIGH (ref 11.5–15.5)
WBC: 4.9 10*3/uL (ref 4.0–10.5)
nRBC: 0 % (ref 0.0–0.2)

## 2021-11-26 LAB — COMPREHENSIVE METABOLIC PANEL
ALT: 18 U/L (ref 0–44)
AST: 26 U/L (ref 15–41)
Albumin: 3.3 g/dL — ABNORMAL LOW (ref 3.5–5.0)
Alkaline Phosphatase: 70 U/L (ref 38–126)
Anion gap: 8 (ref 5–15)
BUN: 6 mg/dL (ref 6–20)
CO2: 28 mmol/L (ref 22–32)
Calcium: 9.2 mg/dL (ref 8.9–10.3)
Chloride: 102 mmol/L (ref 98–111)
Creatinine, Ser: 1.19 mg/dL — ABNORMAL HIGH (ref 0.44–1.00)
GFR, Estimated: 54 mL/min — ABNORMAL LOW (ref >60)
Glucose, Bld: 128 mg/dL — ABNORMAL HIGH (ref 70–99)
Potassium: 2.6 mmol/L — CL (ref 3.5–5.1)
Sodium: 138 mmol/L (ref 135–145)
Total Bilirubin: 1 mg/dL (ref 0.3–1.2)
Total Protein: 7 g/dL (ref 6.5–8.1)

## 2021-11-26 LAB — URINALYSIS, ROUTINE W REFLEX MICROSCOPIC
Bilirubin Urine: NEGATIVE
Glucose, UA: NEGATIVE mg/dL
Hgb urine dipstick: NEGATIVE
Ketones, ur: NEGATIVE mg/dL
Leukocytes,Ua: NEGATIVE
Nitrite: NEGATIVE
Protein, ur: NEGATIVE mg/dL
Specific Gravity, Urine: 1.004 — ABNORMAL LOW (ref 1.005–1.030)
pH: 6 (ref 5.0–8.0)

## 2021-11-26 LAB — TROPONIN I (HIGH SENSITIVITY)
Troponin I (High Sensitivity): 14 ng/L (ref <18)
Troponin I (High Sensitivity): 14 ng/L (ref <18)

## 2021-11-26 LAB — I-STAT BETA HCG BLOOD, ED (MC, WL, AP ONLY): I-stat hCG, quantitative: <5 m[IU]/mL (ref <5)

## 2021-11-26 LAB — CBG MONITORING, ED: Glucose-Capillary: 131 mg/dL — ABNORMAL HIGH (ref 70–99)

## 2021-11-26 LAB — SARS CORONAVIRUS 2 BY RT PCR: SARS Coronavirus 2 by RT PCR: NEGATIVE

## 2021-11-26 LAB — PHOSPHORUS: Phosphorus: 2.5 mg/dL (ref 2.5–4.6)

## 2021-11-26 LAB — PROTIME-INR
INR: 1.1 (ref 0.8–1.2)
Prothrombin Time: 13.9 seconds (ref 11.4–15.2)

## 2021-11-26 LAB — APTT: aPTT: 28 seconds (ref 24–36)

## 2021-11-26 LAB — MAGNESIUM: Magnesium: 1.5 mg/dL — ABNORMAL LOW (ref 1.7–2.4)

## 2021-11-26 MED ORDER — ASPIRIN 81 MG PO TBEC
81.0000 mg | DELAYED_RELEASE_TABLET | Freq: Every day | ORAL | Status: DC
  Administered 2021-11-27 – 2021-11-28 (×2): 81 mg via ORAL
  Filled 2021-11-26 (×2): qty 1

## 2021-11-26 MED ORDER — MAGNESIUM SULFATE 2 GM/50ML IV SOLN
2.0000 g | Freq: Once | INTRAVENOUS | Status: AC
  Administered 2021-11-26: 2 g via INTRAVENOUS
  Filled 2021-11-26: qty 50

## 2021-11-26 MED ORDER — AZILSARTAN-CHLORTHALIDONE 40-25 MG PO TABS: 1.0000 | ORAL_TABLET | Freq: Every day | ORAL | Status: DC

## 2021-11-26 MED ORDER — POTASSIUM CHLORIDE 20 MEQ PO PACK
40.0000 meq | PACK | Freq: Once | ORAL | Status: AC
  Administered 2021-11-26: 40 meq via ORAL
  Filled 2021-11-26: qty 2

## 2021-11-26 MED ORDER — POTASSIUM CHLORIDE 10 MEQ/100ML IV SOLN
10.0000 meq | INTRAVENOUS | Status: AC
  Administered 2021-11-26 (×2): 10 meq via INTRAVENOUS
  Filled 2021-11-26 (×2): qty 100

## 2021-11-26 MED ORDER — SODIUM CHLORIDE 0.9 % IV BOLUS
500.0000 mL | Freq: Once | INTRAVENOUS | Status: AC
  Administered 2021-11-26: 500 mL via INTRAVENOUS

## 2021-11-26 MED ORDER — ONDANSETRON HCL 4 MG PO TABS
4.0000 mg | ORAL_TABLET | Freq: Four times a day (QID) | ORAL | Status: DC | PRN
  Administered 2021-11-27: 4 mg via ORAL
  Filled 2021-11-26: qty 1

## 2021-11-26 MED ORDER — POTASSIUM CHLORIDE CRYS ER 20 MEQ PO TBCR
20.0000 meq | EXTENDED_RELEASE_TABLET | Freq: Every day | ORAL | Status: DC
  Administered 2021-11-26 – 2021-11-28 (×3): 20 meq via ORAL
  Filled 2021-11-26 (×3): qty 1

## 2021-11-26 MED ORDER — AMLODIPINE BESYLATE 10 MG PO TABS
10.0000 mg | ORAL_TABLET | Freq: Every day | ORAL | Status: DC
  Administered 2021-11-27 – 2021-11-28 (×2): 10 mg via ORAL
  Filled 2021-11-26 (×2): qty 1

## 2021-11-26 MED ORDER — POTASSIUM CHLORIDE IN NACL 40-0.9 MEQ/L-% IV SOLN
INTRAVENOUS | Status: DC
  Filled 2021-11-26: qty 1000

## 2021-11-26 MED ORDER — IOHEXOL 350 MG/ML SOLN
80.0000 mL | Freq: Once | INTRAVENOUS | Status: AC | PRN
  Administered 2021-11-26: 80 mL via INTRAVENOUS

## 2021-11-26 MED ORDER — ACETAMINOPHEN 325 MG PO TABS: 650.0000 mg | ORAL_TABLET | Freq: Four times a day (QID) | ORAL | Status: DC | PRN

## 2021-11-26 MED ORDER — HYDROXYZINE HCL 25 MG PO TABS: 25.0000 mg | ORAL_TABLET | Freq: Two times a day (BID) | ORAL | Status: DC | PRN

## 2021-11-26 MED ORDER — ONDANSETRON HCL 4 MG/2ML IJ SOLN: 4.0000 mg | Freq: Four times a day (QID) | INTRAMUSCULAR | Status: DC | PRN

## 2021-11-26 MED ORDER — TRAZODONE HCL 50 MG PO TABS: 50.0000 mg | ORAL_TABLET | Freq: Every evening | ORAL | Status: DC | PRN

## 2021-11-26 MED ORDER — CHLORTHALIDONE 25 MG PO TABS
25.0000 mg | ORAL_TABLET | Freq: Every day | ORAL | Status: DC
  Administered 2021-11-27 – 2021-11-28 (×2): 25 mg via ORAL
  Filled 2021-11-26 (×2): qty 1

## 2021-11-26 MED ORDER — IRBESARTAN 300 MG PO TABS
300.0000 mg | ORAL_TABLET | Freq: Every day | ORAL | Status: DC
  Administered 2021-11-27 – 2021-11-28 (×2): 300 mg via ORAL
  Filled 2021-11-26 (×2): qty 1

## 2021-11-26 MED ORDER — LORAZEPAM 0.5 MG PO TABS
0.5000 mg | ORAL_TABLET | Freq: Three times a day (TID) | ORAL | Status: DC | PRN
  Administered 2021-11-27: 0.5 mg via ORAL
  Filled 2021-11-26: qty 1

## 2021-11-26 MED ORDER — ROSUVASTATIN CALCIUM 20 MG PO TABS
40.0000 mg | ORAL_TABLET | Freq: Every day | ORAL | Status: DC
  Administered 2021-11-27 – 2021-11-28 (×2): 40 mg via ORAL
  Filled 2021-11-26 (×2): qty 2

## 2021-11-26 MED ORDER — ACETAMINOPHEN 650 MG RE SUPP: 650.0000 mg | Freq: Four times a day (QID) | RECTAL | Status: DC | PRN

## 2021-11-26 MED ORDER — SODIUM CHLORIDE 0.9% FLUSH
3.0000 mL | Freq: Two times a day (BID) | INTRAVENOUS | Status: DC
  Administered 2021-11-26 – 2021-11-27 (×3): 3 mL via INTRAVENOUS

## 2021-11-26 NOTE — ED Triage Notes (Signed)
[facility] after near syncopal episode. Pt reports to EMS poor PO intake d/t ongoing CA treatments.

## 2021-11-26 NOTE — ED Provider Notes (Signed)
[facility] DEPT Provider Note   CSN: [id number] Arrival date & time: 11/26/21  1221     History Malignant neoplasm of lower-outer quadrant of right breast of female, estrogen receptor positive ([place]), HTN Chief Complaint  Patient presents with   Near Syncope    Pamela [name] is a 56 y.o. female.  56 year old female with a past medical history of HTN, Malignant neoplasm of lower-outer quadrant of right breast of female, estrogen receptor positive ([place]) currently undergoing chemotherapy presents to the ED with a chief complaint of near syncope.  Patient was at church sitting down when suddenly she felt like "I just could not breathe, I did try my inhaler but that did not work ".  She states "I cannot get it together ", she tried bending forward in order to catch breath with some improvement in her symptoms.  She felt like she was going to pass out.  Does not have any prior episodes in the past.  She is currently undergoing chemotherapy which was started on June 19 after her relapse, she is under the care of oncologist Pamela [name].  Meds are being received every 3 weeks.  Her last therapy on 11/15/2021.  She reports she has been feeling overall rundown since then, increasing fatigue, endorsing nausea and diarrhea which are also adverse effects of her treatment. She denies any chest pain, no headache, no leg swelling, no abdominal pain.No LOC.     The history is provided by the patient and medical records.  Near Syncope This is a new problem. The current episode started 1 to 2 hours ago. The problem occurs rarely. The problem has been gradually improving. Associated symptoms include shortness of breath. Pertinent negatives include no chest pain and no abdominal pain. Nothing aggravates the symptoms. The symptoms are relieved by position. She has tried nothing for the symptoms.       Home Medications Prior to Admission medications   Medication Sig Start Date  End Date Taking? Authorizing Provider  amLODipine (NORVASC) 10 MG tablet Take 1 tablet (10 mg total) by mouth daily. 12/22/20   [name], MD  aspirin EC 81 MG tablet Take 1 tablet (81 mg total) by mouth daily. Swallow whole. 09/18/19   Elgergawy, Silver Huguenin, MD  Azilsartan-Chlorthalidone (EDARBYCLOR) 40-25 MG TABS Take 1 tablet by mouth daily. 12/22/20   [name], MD  dexamethasone (DECADRON) 4 MG tablet Take 1 tablet (4 mg total) by mouth 2 (two) times daily with a meal. Day 2,3,4 one tablet with lunch 11/15/21   Gardenia Phlegm, NP  diphenoxylate-atropine (LOMOTIL) 2.5-0.025 MG tablet Take 1 tablet by mouth 4 (four) times daily as needed for diarrhea or loose stools. 09/04/21   Gardenia Phlegm, NP  hydrOXYzine (ATARAX) 25 MG tablet Take 1 tablet by mouth every 12 (twelve) hours as needed. 01/19/21   [provider]  levocetirizine (XYZAL) 5 MG tablet Take 1 tablet (5 mg total) by mouth every evening. 06/23/21   [name], MD  levonorgestrel (MIRENA, 52 MG,) 20 MCG/DAY IUD 1 each by Intrauterine route once.    [provider]  lidocaine-prilocaine (EMLA) cream Apply 1 application topically daily as needed. 06/07/21   Causey, Charlestine Massed, NP  LORazepam (ATIVAN) 0.5 MG tablet Take 1 tablet (0.5 mg total) by mouth every 8 (eight) hours as needed for anxiety. 08/25/21   Nicholas Lose, MD  montelukast (SINGULAIR) 10 MG tablet Take 1 tablet (10 mg total) by mouth daily. 06/23/21 06/23/22  Baird Cancer,  Bailey Mech, MD  omeprazole (PRILOSEC) 40 MG capsule Take 40 mg by mouth as needed.    [provider]  potassium chloride SA (KLOR-CON M) 20 MEQ tablet Take 1 tablet (20 mEq total) by mouth daily. 10/06/21   Nicholas Lose, MD  rosuvastatin (CRESTOR) 40 MG tablet Take 1 tablet (40 mg total) by mouth daily. 07/26/21   Bensimhon, Shaune Pascal, MD  prochlorperazine (COMPAZINE) 10 MG tablet Take 1 tablet (10 mg total) by mouth every 6 (six) hours as needed (Nausea or  vomiting). 08/14/21 11/04/21  Nicholas Lose, MD      Allergies    Pollen extract, Compazine [prochlorperazine], Latex, Codeine, Shellfish allergy, and Betadine [povidone iodine]    Review of Systems   Review of Systems  Constitutional:  Negative for fever.  Respiratory:  Positive for shortness of breath.   Cardiovascular:  Positive for near-syncope. Negative for chest pain.  Gastrointestinal:  Positive for nausea. Negative for abdominal pain and vomiting.  Genitourinary:  Negative for flank pain.  Neurological:  Positive for syncope and light-headedness.  All other systems reviewed and are negative.   Physical Exam Updated Vital Signs BP 115/78   Pulse 77   Temp 98.1 F (36.7 C) (Oral)   Resp 12   SpO2 100%  Physical Exam Vitals and nursing note reviewed.  Constitutional:      Appearance: Normal appearance. She is ill-appearing.  HENT:     Head: Normocephalic and atraumatic.     Mouth/Throat:     Mouth: Mucous membranes are moist.  Eyes:     Pupils: Pupils are equal, round, and reactive to light.  Cardiovascular:     Rate and Rhythm: Normal rate.  Pulmonary:     Effort: Pulmonary effort is normal.     Breath sounds: No wheezing.  Chest:    Abdominal:     General: Abdomen is flat.     Tenderness: There is no abdominal tenderness.  Musculoskeletal:     Cervical back: Normal range of motion and neck supple.  Skin:    General: Skin is warm and dry.  Neurological:     Mental Status: She is alert and oriented to person, place, and time.     ED Results / Procedures / Treatments   Labs (all labs ordered are listed, but only abnormal results are displayed) Labs Reviewed  CBC WITH DIFFERENTIAL/PLATELET - Abnormal; Notable for the following components:      Result Value   RBC 3.41 (*)    Hemoglobin 10.0 (*)    HCT 30.7 (*)    RDW 17.8 (*)    All other components within normal limits  COMPREHENSIVE METABOLIC PANEL - Abnormal; Notable for the following components:    Potassium 2.6 (*)    Glucose, Bld 128 (*)    Creatinine, Ser 1.19 (*)    Albumin 3.3 (*)    GFR, Estimated 54 (*)    All other components within normal limits  CBG MONITORING, ED - Abnormal; Notable for the following components:   Glucose-Capillary 131 (*)    All other components within normal limits  SARS CORONAVIRUS 2 BY RT PCR  APTT  PROTIME-INR  URINALYSIS, ROUTINE W REFLEX MICROSCOPIC  MAGNESIUM  PHOSPHORUS  I-STAT BETA HCG BLOOD, ED ([facility], [facility], [facility] ONLY)  TROPONIN I (HIGH SENSITIVITY)  TROPONIN I (HIGH SENSITIVITY)    EKG EKG Interpretation  Date/Time:  Sunday November 26 2021 12:34:47 EDT Ventricular Rate:  81 PR Interval:  169 QRS Duration: 88 QT Interval:  388 QTC Calculation: 451 R Axis:   46 Text Interpretation: Sinus rhythm Nonspecific T wave inversions lateral leads V5 V6 and lead III Confirmed by Pamela [name] [phone]) on 11/26/2021 12:50:29 PM  Radiology DG Chest 2 View  Result Date: 11/26/2021 CLINICAL DATA:  Syncope EXAM: CHEST - 2 VIEW COMPARISON:  June 02, 2020 FINDINGS: Stable right Port-A-Cath terminating in the central SVC. No pneumothorax. The heart, hila, mediastinum, lungs, and pleura are unremarkable. No acute abnormalities. IMPRESSION: No active cardiopulmonary disease. Electronically Signed   By: Dorise Bullion III M.D.   On: 11/26/2021 13:20    Procedures Procedures    Medications Ordered in ED Medications  sodium chloride 0.9 % bolus 500 mL (has no administration in time range)  potassium chloride 10 mEq in 100 mL IVPB (has no administration in time range)  magnesium sulfate IVPB 2 g 50 mL (has no administration in time range)  potassium chloride (KLOR-CON) packet 40 mEq (40 mEq Oral Given 11/26/21 1442)    ED Course/ Medical Decision Making/ A&P                           Medical Decision Making Amount and/or Complexity of Data Reviewed Labs: ordered. Radiology: ordered. ECG/medicine tests: ordered.  Risk Prescription drug  management.   This patient presents to the ED for concern of syncope, this involves a number of treatment options, and is a complaint that carries with it a high risk of complications and morbidity.  The differential diagnosis includes hypovolemia, ACS, electrolyte imbalance versus pulmonary embolism.    Co morbidities: Discussed in HPI   Brief History:  Patient here with near syncope was at church, undergoing chemotherapy for stage IV breast cancer HER2+, last chemotherapy approximately 10 days ago.  Endorsing increasing fatigue, generalized weakness no infectious attacks or symptoms.  EMR reviewed including pt PMHx, past surgical history and past visits to ER.   See HPI for more details   Lab Tests:  I ordered and independently interpreted labs.  The pertinent results include:    Labs notable for CBC with slight lower hemoglobin, no leukocytosis. PT INR are normal, she is not anticoagulated.  CMP with hypokalemia with a potassium of 2.6, will have this IV and orally replaced.  No anion gap.  Pregnancy test is negative.   Imaging Studies:  Chest xray without any acute finding. CT Angio is currently pending.    Cardiac Monitoring:  The patient was maintained on a cardiac monitor.  I personally viewed and interpreted the cardiac monitored which showed an underlying rhythm of: NSR EKG non-ischemic   Medicines ordered:  I ordered medication including potassium  for hypokalemia Reevaluation of the patient after these medicines showed that the patient stayed the same I have reviewed the patients home medicines and have made adjustments as needed   Consults:  I requested consultation with Pamela [name],  and discussed lab and imaging findings as well as pertinent plan - they will consult on patient while in the ED.   Reevaluation:  After the interventions noted above I re-evaluated patient and found that they have :stayed the same   Social Determinants of Health:  The  patient's social determinants of health were a factor in the care of this patient  Problem List /ED Course:  Patient presents to the ED status post near syncopal episode while at church while sitting down.  Endorsing some shortness of breath with this.  On arrival she is hemodynamically  stable there was no loss of consciousness, she did not strike her head.  Her vitals are within normal limits.  She is afebrile with no sick contacts.  She received chemo approximately 10 days ago and reports she has been weaker ever since.  She does endorse nausea along with diarrhea but these are adverse effects of her chemotherapy.  She is followed by Pamela [name]. EKG is nonischemic, CBC was unremarkable.  CBG was checked on arrival which was within normal limits.  She is not anticoagulated, with episode of shortness of breath and near syncope is high suspicion for pulmonary embolism as she is currently undergoing active chemotherapy. Her labs today show CBC with no leukocytosis, hemoglobin is stable.  CMP remarkable for hypokalemia, creatinine level is slightly elevated.  LFTs are within normal limits.  Provided with IV potassium along with oral potassium.  No EKG changes on today's visit.First troponin is negative, low suspicion for ACS with no chest pain or prior hx of this.  Patient denies any anticoagulation oxygen saturation is normal without any hypoxia or tachycardia, however still concern for pulmonary embolism.  Obtain CT angio PE.  Spoke to oncology team who will follow patient while in the hospital.  Will call hospitalist service in order to have patient admitted for further management of her hypokalemia. Family along with patient were updated on treatment and plan.  She remains hemodynamically stable without any hypoxia or tachycardia.  CT angio remains pending as she is anticoagulated.   Dispostion:  After consideration of the diagnostic results and the patients response to treatment, I feel that the patent  would benefit from admission.    2:48 PM spoke to Pamela [name], appreciate his assistance patient will be admitted for further management.     Portions of this note were generated with [employer]. Dictation errors may occur despite best attempts at proofreading.   Final Clinical Impression(s) / ED Diagnoses Final diagnoses:  Near syncope  Hypokalemia    Rx / DC Orders ED Discharge Orders     None         Janeece Fitting, PA-C 11/26/21 1448    Elgie Congo, MD 11/26/21 1525

## 2021-11-26 NOTE — ED Notes (Signed)
There will be a delay in collecting labs as pt would like time for emala cream to work prior to accessing port. EDP aware.

## 2021-11-26 NOTE — H&P (Signed)
History and Physical    Patient: Pamela [name]:[id number] DOB: 03-20-1965 DOA: 11/26/2021 DOS: the patient was seen and examined on 11/26/2021 PCP: [name], MD  Patient coming from: Home  Chief Complaint:  Chief Complaint  Patient presents with   Near Syncope   HPI: Pamela [name] is a 56 y.o. female with medical history significant of anxiety, asthma, back pain, breast cancer, constipation, hyperlipidemia, unspecified food allergy, history ovarian cyst, hypertension, class III obesity with pending weight and BMI check, obstructive sleep apnea, not on CPAP, vitamin D deficiency who last had chemotherapy the previous week with subsequent posttreatment diarrhea and fatigue.  She stated that she was at church this morning and felt dyspneic like she was having a panic attack.  She stated that she felt slightly clammy because the [facility] was not working well, but did not feel like she was going to pass out as she was conscious all the time.  She had similar symptoms but not as pronounced earlier in the week that did not respond much to MDI.  She denied chest pain, palpitations, PND, orthopnea or recent pitting edema lower extremities.  No fever, chills or night sweats. No sore throat, rhinorrhea, dyspnea, wheezing or hemoptysis.   No appetite changes, abdominal pain, diarrhea, constipation, melena or hematochezia.  No flank pain, dysuria, frequency or hematuria.  No polyuria, polydipsia, polyphagia or blurred vision.  ED course: Initial vital signs were temperature 98.1 F, pulse 73, respiration 19, BP 129/73 mmHg O2 sat 97% on room air.  The patient received 500 mL of normal saline bolus and KCl 10 mEq IVPB x3.  I added magnesium sulfate 2 g IVPB and KCl 40 mEq p.o. x1.  Lab work: Urinalysis showed decrease a specific gravity but was otherwise unremarkable.  CBC is her white count 4.9, hemoglobin 10.0 g deciliter platelets 217.  Normal PT, INR and PTT.  Troponin x2 normal.  Phosphorus 2.5  magnesium 1.5 mg/dL.  CMP with a potassium of 2.6 mmol/L, but the rest of the electrolytes were unremarkable.  BUN was 6 and creatinine 1.19 mg/dL.  Her most recent creatinine was 0.79 mg/dL.  Usually her creatinine has been under 1.0 mg/dL in the last few months.  LFTs were normal, except for an albumin level of 3.3 g/dL.  Imaging: Chest 2 view chest radiograph with no acute cardiopulmonary disease.  CTA chest no PE, positive scattered coronary artery calcifications and subtle groundglass densities in the medial right lower lobe field questionable for edema or early focal pneumonia.  Please see images and full regular report for further details.  Review of Systems: As mentioned in the history of present illness. All other systems reviewed and are negative. Past Medical History:  Diagnosis Date   Anxiety    Asthma    Back pain    Breast cancer ([place])    Constipation    DUB (dysfunctional uterine bleeding) 2007   Edema of both lower extremities    Elevated cholesterol    Family history of breast cancer 05/12/2020   Family history of lung cancer 05/12/2020   Food allergy    H/O menorrhagia 05/01/2006   High cholesterol    History of ovarian cyst 2007   Hypertension 03/31/2005   Increased BMI 07/11/2006   Joint pain    Migraine    N&V (nausea and vomiting)    Obesity 2007   Obstructive sleep apnea syndrome, moderate 10/27/2013   no cpap   Sleep apnea    SOB (shortness  of breath)    Vitamin D deficiency    Vitamin D deficiency    Weight loss 07/11/2006   Past Surgical History:  Procedure Laterality Date   BREAST LUMPECTOMY WITH RADIOACTIVE SEED AND SENTINEL LYMPH NODE BIOPSY Right 06/02/2020   Procedure: RIGHT BREAST LUMPECTOMY WITH RADIOACTIVE SEED AND RIGHT SENTINEL LYMPH NODE BIOPSY;  Surgeon: [name], MD;  Location: [facility];  Service: General;  Laterality: Right;   BREAST REDUCTION SURGERY  05/1991   CESAREAN SECTION     HYSTEROSCOPY  05/01/2006    PORTACATH PLACEMENT Right 06/02/2020   Procedure: INSERTION PORT-A-CATH;  Surgeon: [name], MD;  Location: [facility];  Service: General;  Laterality: Right;   Social History:  reports that she has never smoked. She has never used smokeless tobacco. She reports that she does not currently use drugs. Frequency: 2.00 times per week. She reports that she does not drink alcohol.  Allergies  Allergen Reactions   Pollen Extract Shortness Of Breath   Compazine [Prochlorperazine] Anxiety    IV compazine causes severe anxiety attack   Latex Rash   Codeine Hives   Shellfish Allergy Hives   Betadine [Povidone Iodine] Rash    Family History  Problem Relation Age of Onset   Hypertension Mother    High Cholesterol Mother    Thyroid disease Mother    Cancer Mother    Sleep apnea Mother    Hypertension Father    Heart attack Father    Sudden death Father    Diabetes Maternal Grandmother    Bone cancer Maternal Grandfather        dx after 67   Breast cancer Maternal Aunt        dx 5s   Lung cancer Maternal Aunt        dx after 50    Prior to Admission medications   Medication Sig Start Date End Date Taking? Authorizing Provider  amLODipine (NORVASC) 10 MG tablet Take 1 tablet (10 mg total) by mouth daily. 12/22/20   [name], MD  aspirin EC 81 MG tablet Take 1 tablet (81 mg total) by mouth daily. Swallow whole. 09/18/19   Elgergawy, Silver Huguenin, MD  Azilsartan-Chlorthalidone (EDARBYCLOR) 40-25 MG TABS Take 1 tablet by mouth daily. 12/22/20   [name], MD  dexamethasone (DECADRON) 4 MG tablet Take 1 tablet (4 mg total) by mouth 2 (two) times daily with a meal. Day 2,3,4 one tablet with lunch 11/15/21   Gardenia Phlegm, NP  diphenoxylate-atropine (LOMOTIL) 2.5-0.025 MG tablet Take 1 tablet by mouth 4 (four) times daily as needed for diarrhea or loose stools. 09/04/21   Gardenia Phlegm, NP  hydrOXYzine (ATARAX) 25 MG tablet Take 1 tablet by mouth  every 12 (twelve) hours as needed. 01/19/21   [provider]  levocetirizine (XYZAL) 5 MG tablet Take 1 tablet (5 mg total) by mouth every evening. 06/23/21   [name], MD  levonorgestrel (MIRENA, 52 MG,) 20 MCG/DAY IUD 1 each by Intrauterine route once.    [provider]  lidocaine-prilocaine (EMLA) cream Apply 1 application topically daily as needed. 06/07/21   Causey, Charlestine Massed, NP  LORazepam (ATIVAN) 0.5 MG tablet Take 1 tablet (0.5 mg total) by mouth every 8 (eight) hours as needed for anxiety. 08/25/21   Nicholas Lose, MD  montelukast (SINGULAIR) 10 MG tablet Take 1 tablet (10 mg total) by mouth daily. 06/23/21 06/23/22  [name], MD  omeprazole (PRILOSEC) 40 MG capsule Take 40  mg by mouth as needed.    [provider]  potassium chloride SA (KLOR-CON M) 20 MEQ tablet Take 1 tablet (20 mEq total) by mouth daily. 10/06/21   Nicholas Lose, MD  rosuvastatin (CRESTOR) 40 MG tablet Take 1 tablet (40 mg total) by mouth daily. 07/26/21   Bensimhon, Shaune Pascal, MD  prochlorperazine (COMPAZINE) 10 MG tablet Take 1 tablet (10 mg total) by mouth every 6 (six) hours as needed (Nausea or vomiting). 08/14/21 11/04/21  Nicholas Lose, MD    Physical Exam: Vitals:   11/26/21 1420 11/26/21 1421 11/26/21 1519 11/26/21 1530  BP: 115/78  132/73 114/67  Pulse: 80 77 74   Resp: '17 12 16 '$ (!) 21  Temp:   97.9 F (36.6 C)   TempSrc:   Oral   SpO2:  100% 99%    Physical Exam Vitals and nursing note reviewed.  Constitutional:      General: She is awake. She is not in acute distress.    Appearance: Normal appearance. She is obese.  HENT:     Head: Normocephalic.     Nose: No rhinorrhea.     Mouth/Throat:     Mouth: Mucous membranes are moist.  Eyes:     General: No scleral icterus.    Pupils: Pupils are equal, round, and reactive to light.  Neck:     Vascular: No JVD.  Cardiovascular:     Rate and Rhythm: Normal rate and regular rhythm.     Heart sounds: S1 normal  and S2 normal.  Pulmonary:     Effort: Pulmonary effort is normal.     Breath sounds: Normal breath sounds. No wheezing, rhonchi or rales.  Abdominal:     General: There is no distension.     Tenderness: There is no abdominal tenderness. There is no right CVA tenderness, left CVA tenderness or guarding.  Musculoskeletal:     Cervical back: Neck supple.     Right lower leg: No edema.     Left lower leg: No edema.  Skin:    General: Skin is warm and dry.  Neurological:     General: No focal deficit present.     Mental Status: She is alert and oriented to person, place, and time.  Psychiatric:        Mood and Affect: Mood normal.        Behavior: Behavior normal. Behavior is cooperative.   Data Reviewed:  Results are pending, will review when available.  09/21/2021 echocardiogram complete IMPRESSIONS:   1. Left ventricular ejection fraction, by estimation, is 60 to 65%. The  left ventricle has normal function. The left ventricle has no regional  wall motion abnormalities. There is mild left ventricular hypertrophy.  Left ventricular diastolic parameters  are consistent with Grade I diastolic dysfunction (impaired relaxation).  GLS underestiamted due to poor endocardial tracking. GLS -15.9%   2. Right ventricular systolic function is normal. The right ventricular  size is normal.   3. Left atrial size was mildly dilated.   4. The mitral valve is normal in structure. Trivial mitral valve  regurgitation. No evidence of mitral stenosis.   5. The aortic valve is normal in structure. Aortic valve regurgitation is  not visualized. No aortic stenosis is present.   6. The inferior vena cava is normal in size with greater than 50%  respiratory variability, suggesting right atrial pressure of 3 mmHg.   Assessment and Plan: Principal Problem:   Volume depletion Clinically AKI. Observation/telemetry. Continue IV fluids.  Hold ARB/ACE. Hold diuretic. Avoid hypotension. Avoid  nephrotoxins. Monitor intake and output. Monitor renal function electrolytes.  Active Problems:   Hypomagnesemia Replacement ordered. Deferred regular supplementation due to diarrhea.    Hypokalemia Replacing.    Dyspnea O2 sat normal. Chest imaging benign. Felt more like a panic attack. MDI did not help earlier in the week. Likely a combination of: Physical deconditioning. Dehydration and electrolyte derangement.    Breast cancer Follow-up with Dr. Alvy Bimler as scheduled. Gets a lot of post chemotherapy diarrhea. May benefit from no diuretic or lower dose of it.    Mixed hyperlipidemia Continue rosuvastatin 40 mg p.o. daily.    Class 3 severe obesity due to excess calories with serious  comorbidity and body mass index (BMI) of 45.0 to 49.9 in adult [facility]) Continue lifestyle modifications. Follow-up with primary care provider.    Essential hypertension Continue amlodipine 10 mg p.o. daily. Monitor blood pressure and heart rate.    Obstructive sleep apnea syndrome, moderate Not on CPAP.    Prediabetes Check fasting glucose.    Advance Care Planning:   Code Status: Prior   Consults:   Family Communication:   Severity of Illness: The appropriate patient status for this patient is OBSERVATION. Observation status is judged to be reasonable and necessary in order to provide the required intensity of service to ensure the patient's safety. The patient's presenting symptoms, physical exam findings, and initial radiographic and laboratory data in the context of their medical condition is felt to place them at decreased risk for further clinical deterioration. Furthermore, it is anticipated that the patient will be medically stable for discharge from the hospital within 2 midnights of admission.   Author: Reubin Milan, MD 11/26/2021 3:44 PM  For on call review [email or web address].   This document was prepared using Dragon voice recognition software and may contain some  unintended transcription errors.

## 2021-11-27 ENCOUNTER — Other Ambulatory Visit: Payer: Self-pay

## 2021-11-27 ENCOUNTER — Other Ambulatory Visit: Payer: Self-pay | Admitting: Hematology and Oncology

## 2021-11-27 ENCOUNTER — Other Ambulatory Visit: Payer: 59

## 2021-11-27 DIAGNOSIS — Z170 Estrogen receptor positive status [ER+]: Secondary | ICD-10-CM | POA: Diagnosis not present

## 2021-11-27 DIAGNOSIS — R42 Dizziness and giddiness: Secondary | ICD-10-CM | POA: Diagnosis not present

## 2021-11-27 DIAGNOSIS — Z20822 Contact with and (suspected) exposure to covid-19: Secondary | ICD-10-CM | POA: Diagnosis not present

## 2021-11-27 DIAGNOSIS — C50511 Malignant neoplasm of lower-outer quadrant of right female breast: Secondary | ICD-10-CM | POA: Diagnosis not present

## 2021-11-27 LAB — COMPREHENSIVE METABOLIC PANEL
ALT: 16 U/L (ref 0–44)
AST: 26 U/L (ref 15–41)
Albumin: 3 g/dL — ABNORMAL LOW (ref 3.5–5.0)
Alkaline Phosphatase: 65 U/L (ref 38–126)
Anion gap: 6 (ref 5–15)
BUN: <5 mg/dL — ABNORMAL LOW (ref 6–20)
CO2: 28 mmol/L (ref 22–32)
Calcium: 8.9 mg/dL (ref 8.9–10.3)
Chloride: 105 mmol/L (ref 98–111)
Creatinine, Ser: 0.96 mg/dL (ref 0.44–1.00)
GFR, Estimated: >60 mL/min (ref >60)
Glucose, Bld: 114 mg/dL — ABNORMAL HIGH (ref 70–99)
Potassium: 3.6 mmol/L (ref 3.5–5.1)
Sodium: 139 mmol/L (ref 135–145)
Total Bilirubin: 0.7 mg/dL (ref 0.3–1.2)
Total Protein: 6.5 g/dL (ref 6.5–8.1)

## 2021-11-27 LAB — CBC
HCT: 29 % — ABNORMAL LOW (ref 36.0–46.0)
Hemoglobin: 9.1 g/dL — ABNORMAL LOW (ref 12.0–15.0)
MCH: 28.7 pg (ref 26.0–34.0)
MCHC: 31.4 g/dL (ref 30.0–36.0)
MCV: 91.5 fL (ref 80.0–100.0)
Platelets: 228 10*3/uL (ref 150–400)
RBC: 3.17 MIL/uL — ABNORMAL LOW (ref 3.87–5.11)
RDW: 18.4 % — ABNORMAL HIGH (ref 11.5–15.5)
WBC: 4.9 10*3/uL (ref 4.0–10.5)
nRBC: 0 % (ref 0.0–0.2)

## 2021-11-27 LAB — HIV ANTIBODY (ROUTINE TESTING W REFLEX): HIV Screen 4th Generation wRfx: NONREACTIVE

## 2021-11-27 MED ORDER — ALUM & MAG HYDROXIDE-SIMETH 200-200-20 MG/5ML PO SUSP
30.0000 mL | Freq: Once | ORAL | Status: AC
  Administered 2021-11-27: 30 mL via ORAL
  Filled 2021-11-27: qty 30

## 2021-11-27 MED ORDER — LOPERAMIDE HCL 1 MG/7.5ML PO SUSP
1.0000 mg | ORAL | Status: DC | PRN
  Administered 2021-11-27: 1 mg via ORAL
  Filled 2021-11-27 (×2): qty 7.5

## 2021-11-27 MED ORDER — ZINC OXIDE 40 % EX OINT
TOPICAL_OINTMENT | CUTANEOUS | Status: DC | PRN
  Filled 2021-11-27: qty 57

## 2021-11-27 MED ORDER — PROCHLORPERAZINE MALEATE 10 MG PO TABS
10.0000 mg | ORAL_TABLET | Freq: Once | ORAL | Status: AC
  Administered 2021-11-27: 10 mg via ORAL
  Filled 2021-11-27: qty 1

## 2021-11-27 MED ORDER — WITCH HAZEL-GLYCERIN EX PADS: MEDICATED_PAD | CUTANEOUS | Status: DC | PRN

## 2021-11-27 MED ORDER — MAGNESIUM SULFATE 2 GM/50ML IV SOLN
2.0000 g | Freq: Once | INTRAVENOUS | Status: AC
  Administered 2021-11-27: 2 g via INTRAVENOUS
  Filled 2021-11-27: qty 50

## 2021-11-27 MED ORDER — PANTOPRAZOLE SODIUM 40 MG PO TBEC
40.0000 mg | DELAYED_RELEASE_TABLET | Freq: Once | ORAL | Status: AC
  Administered 2021-11-27: 40 mg via ORAL
  Filled 2021-11-27: qty 1

## 2021-11-27 MED ORDER — FAMOTIDINE 20 MG PO TABS
20.0000 mg | ORAL_TABLET | Freq: Once | ORAL | Status: AC
  Administered 2021-11-27: 20 mg via ORAL
  Filled 2021-11-27: qty 1

## 2021-11-27 MED ORDER — CHLORHEXIDINE GLUCONATE CLOTH 2 % EX PADS
6.0000 | MEDICATED_PAD | Freq: Every day | CUTANEOUS | Status: DC
  Administered 2021-11-27 – 2021-11-28 (×2): 6 via TOPICAL

## 2021-11-27 MED ORDER — POTASSIUM CHLORIDE IN NACL 20-0.9 MEQ/L-% IV SOLN
INTRAVENOUS | Status: AC
  Filled 2021-11-27: qty 1000

## 2021-11-27 MED ORDER — LIDOCAINE VISCOUS HCL 2 % MT SOLN
15.0000 mL | Freq: Once | OROMUCOSAL | Status: AC
  Administered 2021-11-27: 15 mL via ORAL
  Filled 2021-11-27: qty 15

## 2021-11-27 NOTE — Assessment & Plan Note (Signed)
Continue Crestor 

## 2021-11-27 NOTE — Assessment & Plan Note (Signed)
-   Repeat Magnesium - Check mag tomorrow

## 2021-11-27 NOTE — Assessment & Plan Note (Signed)
-   Continue K

## 2021-11-27 NOTE — Assessment & Plan Note (Signed)
BP controlled  - Continue amlodipine, ARB, chlorthalidone - Continue K supplement

## 2021-11-27 NOTE — Assessment & Plan Note (Signed)
BMI 40 

## 2021-11-27 NOTE — Progress Notes (Signed)
  Progress Note   Patient: Pamela [name] UVO:[id number] DOB: Oct 26, 1965 DOA: 11/26/2021     0 DOS: the patient was seen and examined on 11/27/2021 at 8:50AM and 3:20PM      Brief hospital course: Pamela [name] is a 56 y.o. F with BrCA metastatic to right supraclavicular LN s/p Taxol now on [place], [place], and OSA not on CPAP who presented with dizziness, fatigue, and globus sensation.  In the ER, CTA chest showed no PE, no aortic disease, no lung disease other than equivocal light GGOs, troponins normal, ECG with old TWI.   K 2.6    9/24: Admitted on fluids, K 9/25: Still dizzy and out of breath walking a few feet, K normalized       Assessment and Plan: * Dizziness, fatigue, globus sensation Given fluids and K corrected, but today still dizzy and weak unable to walk more than a few feet.  PE, dissection, and other intrathoracic pathology seem ruled out.  The "pneumonia" on CT would not cause above symptoms. - Obtain limited echo to check EF and for effusion - Continue IV fluids - Check barium esophagram to evaluate globus sensation     Hypokalemia - Continue K  Hypomagnesemia - Repeat Magnesium - Check mag tomorrow  Breast cancer (HCC)    Prediabetes    Mixed hyperlipidemia - Continue Crestor  Obstructive sleep apnea syndrome, moderate Not on CPAP  Essential hypertension BP controlled  - Continue amlodipine, ARB, chlorthalidone - Continue K supplement  Class 3 severe obesity due to excess calories with serious comorbidity and body mass index (BMI) of 45.0 to 49.9 in adult (HCC) BMI >40          Subjective: Feeling tired, dizzy, weak.  Still globus sensation.  Still having multiple stools loose so far today.     Physical Exam: BP 122/66 (BP Location: Left Arm)   Pulse 91   Temp 98.3 F (36.8 C) (Oral)   Resp 16   Ht $R'5\' 9"'Dz$  (1.753 m)   SpO2 97%   BMI 44.83 kg/m   Obese adult female, lying in bed, no acute distress, appears tired RRR no  murmurs, no LE pitting Respiratory rate normal, lung sounds faint due to habitus, but no wheezing or rales appreciated Attention normal, affect normal, face symmetric, speech fluient, moves all extremities, thought content normal   Data Reviewed: BMP shows normalized potasium, stable normal renal function. CTA chset report reviewed, no PE, maybe mild pneumonia ECG personally reviewed, normal sinus with some nonspecific changes, repeat today stable. COVID-      Disposition: Status is: Observation         Author: Edwin Dada, MD 11/27/2021 4:48 PM  For on call review [email or web address].

## 2021-11-27 NOTE — Hospital Course (Signed)
Pamela [name] is a 56 y.o. F with BrCA metastatic to right supraclavicular LN s/p Taxol now on [place], [place], and OSA not on CPAP who presented with dizziness, fatigue, and globus sensation.  In the ER, CTA chest showed no PE, no aortic disease, no lung disease other than equivocal light GGOs, troponins normal, ECG with old TWI.   K 2.6    9/24: Admitted on fluids, K 9/25: Still dizzy and out of breath walking a few feet, K normalized

## 2021-11-27 NOTE — Assessment & Plan Note (Signed)
Given fluids and K corrected, but today still dizzy and weak unable to walk more than a few feet.  PE, dissection, and other intrathoracic pathology seem ruled out.  The "pneumonia" on CT would not cause above symptoms. - Obtain limited echo to check EF and for effusion - Continue IV fluids - Check barium esophagram to evaluate globus sensation

## 2021-11-28 ENCOUNTER — Observation Stay (HOSPITAL_BASED_OUTPATIENT_CLINIC_OR_DEPARTMENT_OTHER): Payer: 59

## 2021-11-28 ENCOUNTER — Observation Stay (HOSPITAL_COMMUNITY): Payer: 59

## 2021-11-28 DIAGNOSIS — R0609 Other forms of dyspnea: Secondary | ICD-10-CM

## 2021-11-28 DIAGNOSIS — R42 Dizziness and giddiness: Secondary | ICD-10-CM | POA: Diagnosis not present

## 2021-11-28 LAB — BASIC METABOLIC PANEL
Anion gap: 7 (ref 5–15)
BUN: <5 mg/dL — ABNORMAL LOW (ref 6–20)
CO2: 26 mmol/L (ref 22–32)
Calcium: 9.1 mg/dL (ref 8.9–10.3)
Chloride: 103 mmol/L (ref 98–111)
Creatinine, Ser: 1.05 mg/dL — ABNORMAL HIGH (ref 0.44–1.00)
GFR, Estimated: >60 mL/min (ref >60)
Glucose, Bld: 120 mg/dL — ABNORMAL HIGH (ref 70–99)
Potassium: 3.4 mmol/L — ABNORMAL LOW (ref 3.5–5.1)
Sodium: 136 mmol/L (ref 135–145)

## 2021-11-28 LAB — CBC
HCT: 29 % — ABNORMAL LOW (ref 36.0–46.0)
Hemoglobin: 9.2 g/dL — ABNORMAL LOW (ref 12.0–15.0)
MCH: 29 pg (ref 26.0–34.0)
MCHC: 31.7 g/dL (ref 30.0–36.0)
MCV: 91.5 fL (ref 80.0–100.0)
Platelets: 256 10*3/uL (ref 150–400)
RBC: 3.17 MIL/uL — ABNORMAL LOW (ref 3.87–5.11)
RDW: 18.5 % — ABNORMAL HIGH (ref 11.5–15.5)
WBC: 5.3 10*3/uL (ref 4.0–10.5)
nRBC: 0.4 % — ABNORMAL HIGH (ref 0.0–0.2)

## 2021-11-28 LAB — ECHOCARDIOGRAM LIMITED
Height: 69 in
S' Lateral: 3.3 cm
Weight: 4881.87 oz

## 2021-11-28 LAB — GLUCOSE, CAPILLARY: Glucose-Capillary: 101 mg/dL — ABNORMAL HIGH (ref 70–99)

## 2021-11-28 MED ORDER — POTASSIUM CHLORIDE 20 MEQ PO PACK
60.0000 meq | PACK | Freq: Once | ORAL | Status: AC
  Administered 2021-11-28: 60 meq via ORAL
  Filled 2021-11-28: qty 3

## 2021-11-28 MED ORDER — PANTOPRAZOLE SODIUM 40 MG PO TBEC: 40.0000 mg | DELAYED_RELEASE_TABLET | Freq: Every day | ORAL | 1 refills | Status: DC

## 2021-11-28 MED ORDER — HEPARIN SOD (PORK) LOCK FLUSH 100 UNIT/ML IV SOLN
500.0000 [IU] | INTRAVENOUS | Status: DC | PRN
  Filled 2021-11-28: qty 5

## 2021-11-28 NOTE — TOC Initial Note (Signed)
Transition of Care [facility]) - Initial/Assessment Note    Patient Details  Name: Pamela [name] MRN: [id number] Date of Birth: October 21, 1965  Transition of Care [facility]) CM/SW Contact:    Leeroy Cha, RN Phone Number: 11/28/2021, 8:45 AM  Clinical Narrative:                  Transition of Care [facility]) Screening Note   Patient Details  Name: Pamela [name] Date of Birth: [birth year]   Transition of Care [facility]) CM/SW Contact:    Leeroy Cha, RN Phone Number: 11/28/2021, 8:45 AM    Transition of Care Department [facility]) has reviewed patient and no TOC needs have been identified at this time. We will continue to monitor patient advancement through interdisciplinary progression rounds. If new patient transition needs arise, please place a TOC consult.    Expected Discharge Plan: Home/Self Care Barriers to Discharge: Continued Medical Work up   Patient Goals and CMS Choice Patient states their goals for this hospitalization and ongoing recovery are:: to go home CMS Medicare.gov Compare Post Acute Care list provided to:: Patient    Expected Discharge Plan and Services Expected Discharge Plan: Home/Self Care   Discharge Planning Services: CM Consult   Living arrangements for the past 2 months: Single Family Home                                      Prior Living Arrangements/Services Living arrangements for the past 2 months: Single Family Home Lives with:: Self Patient language and need for interpreter reviewed:: Yes Do you feel safe going back to the place where you live?: Yes            Criminal Activity/Legal Involvement Pertinent to Current Situation/Hospitalization: No - Comment as needed  Activities of Daily Living Home Assistive Devices/Equipment: None ADL Screening (condition at time of admission) Patient's cognitive ability adequate to safely complete daily activities?: Yes Is the patient deaf or have difficulty hearing?: No Does the  patient have difficulty seeing, even when wearing glasses/contacts?: No Does the patient have difficulty concentrating, remembering, or making decisions?: No Patient able to express need for assistance with ADLs?: Yes Does the patient have difficulty dressing or bathing?: No Independently performs ADLs?: Yes (appropriate for developmental age) Does the patient have difficulty walking or climbing stairs?: No Weakness of Legs: None Weakness of Arms/Hands: None  Permission Sought/Granted                  Emotional Assessment Appearance:: Appears stated age Attitude/Demeanor/Rapport: Engaged Affect (typically observed): Calm Orientation: : Oriented to Situation, Oriented to  Time, Oriented to Place, Oriented to Self Alcohol / Substance Use: Never Used Psych Involvement: No (comment)  Admission diagnosis:  Hypokalemia [E87.6] Near syncope [R55] Patient Active Problem List   Diagnosis Date Noted   Hypomagnesemia 11/26/2021   Hypokalemia 11/26/2021   Dizziness, fatigue, globus sensation 11/26/2021   Breast cancer ([place]) 08/25/2021   Nausea without vomiting 08/25/2021   Prediabetes 03/13/2021   Non-intractable vomiting 07/13/2020   Port-A-Cath in place 07/08/2020   Genetic testing 05/20/2020   Family history of breast cancer 05/12/2020   Family history of lung cancer 05/12/2020   Malignant neoplasm of lower-outer quadrant of right breast of female, estrogen receptor positive ([place]) 05/03/2020   Elevated troponin level not due myocardial infarction 09/18/2019   Mixed hyperlipidemia 09/18/2019   Hypertensive emergency 09/17/2019   Primary  osteoarthritis of right hip 08/06/2018   Seasonal allergies 06/12/2018   Fibrocystic disease of breast 05/27/2018   Obstructive sleep apnea syndrome, moderate 10/27/2013   Shift work sleep disorder 10/27/2013   Psychic factors associated with diseases classified elsewhere 08/07/2013   High cholesterol 07/21/2013   Essential hypertension  07/21/2013   Snoring 07/21/2013   IUD contraception-inserted 10/01/11 10/01/2011   Class 3 severe obesity due to excess calories with serious comorbidity and body mass index (BMI) of 45.0 to 49.9 in adult [facility]) 09/03/2011   PCP:  [name], MD Pharmacy:   [facility], [place] [address] Phone: [phone] Fax: [phone]  [address], [place] [address] Phone: [phone] Fax: [phone]  [id number] [place] [place]Phone: [phone]: [phone] [facility] [address]Phone: [phone]: [phone] [id number] [place] [address]: [phone]: [phone] [place]: [phone]: [phone]   Social Determinants of Health (SDOH) Interventions    Readmission Risk Interventions   No data to display

## 2021-11-28 NOTE — Discharge Summary (Signed)
Physician Discharge Summary   Patient: Pamela [name] MRN: [id number] DOB: 10-Aug-1965  Admit date:     11/26/2021  Discharge date: 11/28/21  Discharge Physician: Edwin Dada   PCP: [name], MD     Recommendations at discharge:  Follow up with Oncology Dr. Lindi Adie as scheduled Dr. Lindi Adie: Please recheck Potassium within 1 week Follow up with PCP Dr. Baird Cancer in 1-2 weeks Dr. Baird Cancer: Please assess dysphagia symptoms; if not resolved, start sucralfate and refer to GI for esophageal dysmotility     Discharge Diagnoses: Principal Problem:   Dizziness, fatigue likely due to chemotherapy Active Problems:   Esophageal dysmotility   Class 3 severe obesity due to excess calories with serious comorbidity and body mass index (BMI) of 45.0 to 49.9 in adult [facility])   Essential hypertension   Obstructive sleep apnea syndrome, moderate   Mixed hyperlipidemia   Prediabetes   Breast cancer ([place])   Hypomagnesemia   Hypokalemia      Hospital Course: Pamela [name] is a 56 y.o. F with BrCA metastatic to right supraclavicular LN s/p Taxol now on [place], [place], and OSA not on CPAP who presented with dizziness, fatigue, and globus sensation.  In the ER, CTA chest showed no PE, no aortic disease, no lung disease other than equivocal light GGOs, troponins normal, ECG with old TWI.   K 2.6       * Dizziness, fatigue CTA ruled out PE, dissection, intrathoracic pathology.  Mild ground glass opacity on CT chest of unclear significance, no symptoms of pneumonia.   Echo normal.    Given fluids and K corrected, symptoms slowly resolved, tolerating ambulation without symptoms and diet well.        Globus sensation due to esophageal dysmotility Cardiac pathology ruled out.  Barium esophagram was equivocal for proximal esophageal web.  It was positive for esophageal dysmotility.   Hx prior breast radiation.  Patient taking food well.  Patient recommended to take PPI and sucralfate,  declined latter.  Will follow up with GI as outpatient.              The [facility] Controlled Substances Registry was reviewed for this patient prior to discharge.   Procedures performed:  Barium esophagram, echocardiogram  Disposition: Home   DISCHARGE MEDICATION: Allergies as of 11/28/2021       Reactions   Pollen Extract Shortness Of Breath   Compazine [prochlorperazine] Anxiety   IV compazine causes severe anxiety attack   Latex Rash   Codeine Hives   Shellfish Allergy Hives   Betadine [povidone Iodine] Rash        Medication List     STOP taking these medications    omeprazole 40 MG capsule Commonly known as: PRILOSEC       TAKE these medications    amLODipine 10 MG tablet Commonly known as: NORVASC Take 1 tablet (10 mg total) by mouth daily.   aspirin EC 81 MG tablet Take 1 tablet (81 mg total) by mouth daily. Swallow whole.   dexamethasone 4 MG tablet Commonly known as: DECADRON Take 1 tablet (4 mg total) by mouth 2 (two) times daily with a meal. Day 2,3,4 one tablet with lunch   diphenoxylate-atropine 2.5-0.025 MG tablet Commonly known as: LOMOTIL Take 1 tablet by mouth 4 (four) times daily as needed for diarrhea or loose stools.   Edarbyclor 40-25 MG Tabs Generic drug: Azilsartan-Chlorthalidone Take 1 tablet by mouth daily.   hydrOXYzine 25 MG tablet Commonly known as: ATARAX Take  1 tablet by mouth every 12 (twelve) hours as needed for anxiety or itching.   lidocaine-prilocaine cream Commonly known as: EMLA Apply 1 application topically daily as needed. What changed: reasons to take this   LORazepam 0.5 MG tablet Commonly known as: ATIVAN Take 1 tablet (0.5 mg total) by mouth every 8 (eight) hours as needed for anxiety.   Mirena (52 MG) 20 MCG/DAY Iud Generic drug: levonorgestrel 1 each by Intrauterine route once.   montelukast 10 MG tablet Commonly known as: SINGULAIR Take 1 tablet (10 mg total) by mouth daily. What  changed:  when to take this reasons to take this   oxyCODONE 5 MG immediate release tablet Commonly known as: Oxy IR/ROXICODONE Take 1 tablet (5 mg total) by mouth every 6 (six) hours as needed for severe pain.   pantoprazole 40 MG tablet Commonly known as: Protonix Take 1 tablet (40 mg total) by mouth daily.   potassium chloride SA 20 MEQ tablet Commonly known as: KLOR-CON M Take 1 tablet (20 mEq total) by mouth daily.   prochlorperazine 10 MG tablet Commonly known as: COMPAZINE Take 10 mg by mouth every 6 (six) hours as needed for nausea or vomiting.   rosuvastatin 40 MG tablet Commonly known as: CRESTOR Take 1 tablet (40 mg total) by mouth daily.         Discharge Instructions     Discharge instructions   Complete by: As directed    FROM DR. Niobe Dick: You were admitted for a dizzy episode and trouble breathing Here, we did a CT angiogram of the chest, which ruled out pulmonary embolism (Blood clot in the lungs), lung collapse, or problems with the blood vessels of the chest (that might cause pain or trouble breathing).  We ruled out a heart attack I will follow up your echo results.  You had low potassium and seemed dehydrated when you were admitted, so we treated this.  Everything else seemed normal.  The swallowing sensation you have appears to be from "dysmotility" of the esophagus.  This is common and has lots of causes.  I recommend for now that you take pantoprazole (antacid medicine, similar to Prilosec) every day and go see your primary doctor in 1 week for a referral to a gastroenterologist.  (If you've ever had a colonoscopy, you could try calling the Gastroenterologist to schedule an appointment directly)  Go see Dr. Lindi Adie as scheduled. Get labs next week   Increase activity slowly   Complete by: As directed        Discharge Exam: Filed Weights   11/27/21 2100  Weight: (!) 138.4 kg    General: Pt is alert, awake, not in acute  distress Cardiovascular: RRR, nl S1-S2, no murmurs appreciated.   No LE edema.   Respiratory: Normal respiratory rate and rhythm.  CTAB without rales or wheezes. Abdominal: Abdomen soft and non-tender.  No distension or HSM.   Neuro/Psych: Strength symmetric in upper and lower extremities.  Judgment and insight appear normal.   Condition at discharge: good  The results of significant diagnostics from this hospitalization (including imaging, microbiology, ancillary and laboratory) are listed below for reference.   Imaging Studies: ECHOCARDIOGRAM LIMITED  Result Date: 11/28/2021    ECHOCARDIOGRAM LIMITED REPORT   Patient Name:   Pamela [name] Date of Exam: 11/28/2021 Medical Rec #:  [id number]          Height:       69.0 in Accession #:    [id number]  Weight:       305.1 lb Date of Birth:  May 17, 1965          BSA:          2.472 m Patient Age:    56 years           BP:           131/84 mmHg Patient Gender: F                  HR:           80 bpm. Exam Location:  Inpatient Procedure: Limited Echo Indications:    Dyspnea  History:        Patient has prior history of Echocardiogram examinations, most                 recent 09/21/2021. Risk Factors:Hypertension, Dyslipidemia and                 Sleep Apnea.  Sonographer:    Jefferey Pica Referring Phys: [id number] Khiara Shuping P Jayion Schneck IMPRESSIONS  1. Left ventricular ejection fraction, by estimation, is 60 to 65%. The left ventricle has normal function. There is severe concentric left ventricular hypertrophy.  2. Right ventricular systolic function is normal. The right ventricular size is normal.  3. The mitral valve is grossly normal.  4. The aortic valve is tricuspid.  5. The inferior vena cava is normal in size with greater than 50% respiratory variability, suggesting right atrial pressure of 3 mmHg. Comparison(s): Prior images reviewed side by side. Limted study- similar to prior. FINDINGS  Left Ventricle: Left ventricular ejection fraction, by  estimation, is 60 to 65%. The left ventricle has normal function. There is severe concentric left ventricular hypertrophy. Right Ventricle: The right ventricular size is normal. Right ventricular systolic function is normal. Left Atrium: Left atrial size was normal in size. Right Atrium: Right atrial size was normal in size. Mitral Valve: The mitral valve is grossly normal. Aortic Valve: The aortic valve is tricuspid. Aorta: The aortic root and ascending aorta are structurally normal, with no evidence of dilitation. Venous: The inferior vena cava is normal in size with greater than 50% respiratory variability, suggesting right atrial pressure of 3 mmHg. LEFT VENTRICLE PLAX 2D LVIDd:         4.70 cm LVIDs:         3.30 cm LV PW:         1.50 cm LV IVS:        1.50 cm  IVC IVC diam: 1.60 cm LEFT ATRIUM             Index        RIGHT ATRIUM           Index LA diam:        3.80 cm 1.54 cm/m   RA Area:     15.90 cm LA Vol (A2C):   50.3 ml 20.35 ml/m  RA Volume:   43.50 ml  17.60 ml/m LA Vol (A4C):   47.7 ml 19.29 ml/m LA Biplane Vol: 51.5 ml 20.83 ml/m   AORTA Ao Root diam: 3.40 cm Ao Asc diam:  3.20 cm Rudean Haskell MD Electronically signed by Rudean Haskell MD Signature Date/Time: 11/28/2021/4:22:03 PM    Final    DG ESOPHAGUS W SINGLE CM (SOL OR THIN BA)  Result Date: 11/28/2021 CLINICAL DATA:  Globus sensation, history of breast cancer with lymphadenopathy EXAM: ESOPHAGUS/BARIUM SWALLOW/TABLET STUDY TECHNIQUE: Single contrast examination was performed using thin  liquid barium and 1/2 inch barium tablet. This exam was performed by Pasty Spillers, PA-C, and was supervised and interpreted by Sherryl Barters, MD. FLUOROSCOPY: Radiation Exposure Index (as provided by the fluoroscopic device): 12.40 mGy Kerma COMPARISON:  None Available. FINDINGS: Swallowing: Appears normal. No vestibular penetration or aspiration seen. Incidental cervical spondylosis noted. Pharynx: Questionable anterior cervical  web on image 17 of series 1 although not readily visible with the second swallow. Esophagus: Minimal narrowing of the contrast column in the lower cervical region attributed to adjacent cervical spondylosis based on the 11/26/2021 chest CT. Esophageal motility: Disruption of primary peristaltic waves in the upper to mid esophagus on multiple swallows with incomplete esophageal clearing. Hiatal Hernia: None. Gastroesophageal reflux: None visualized. Ingested 58m barium tablet: Passed normally Other: None. IMPRESSION: 1.  Nonspecific esophageal dysmotility disorder. 2. Equivocal anterior cervical web, without obstruction. This may instead represent a venous plexus given that it was inconstant during swallows. Electronically Signed   By: WVan ClinesM.D.   On: 11/28/2021 11:54   CT Angio Chest PE W and/or Wo Contrast  Result Date: 11/26/2021 CLINICAL DATA:  Near syncope EXAM: CT ANGIOGRAPHY CHEST WITH CONTRAST TECHNIQUE: Multidetector CT imaging of the chest was performed using the standard protocol during bolus administration of intravenous contrast. Multiplanar CT image reconstructions and MIPs were obtained to evaluate the vascular anatomy. RADIATION DOSE REDUCTION: This exam was performed according to the departmental dose-optimization program which includes automated exposure control, adjustment of the mA and/or kV according to patient size and/or use of iterative reconstruction technique. CONTRAST:  82mOMNIPAQUE IOHEXOL 350 MG/ML SOLN COMPARISON:  10/25/2021 FINDINGS: Cardiovascular: There is homogeneous enhancement in thoracic aorta. There are no intraluminal filling defects in pulmonary artery branches. Main pulmonary artery measures 3.5 cm in diameter. Scattered coronary artery calcifications are seen. Tip of right IJ chest port is seen in superior vena cava close to the right atrium. Mediastinum/Nodes: No new significant lymphadenopathy is seen in mediastinum. There is lymph node measuring 12 mm  in short axis in the right supraclavicular region. Postsurgical changes are noted in right breast. Lungs/Pleura: Subtle increased markings are seen in medial right lower lobe. There is no focal pulmonary consolidation. There is no pleural effusion or pneumothorax. Upper Abdomen: No acute findings are seen. Musculoskeletal: No acute findings are seen. Review of the MIP images confirms the above findings. IMPRESSION: There is no evidence of pulmonary artery embolism. There is no evidence of thoracic aortic dissection. There are scattered coronary artery calcifications. There is small area of subtle ground-glass densities in the medial right lower lung field which may suggest crowding of normal bronchovascular structures or early focal pneumonia. Postsurgical changes are noted in right breast. There is slightly enlarged lymph node in right supraclavicular region with no significant interval change. Electronically Signed   By: PaElmer Picker.D.   On: 11/26/2021 16:27   DG Chest 2 View  Result Date: 11/26/2021 CLINICAL DATA:  Syncope EXAM: CHEST - 2 VIEW COMPARISON:  June 02, 2020 FINDINGS: Stable right Port-A-Cath terminating in the central SVC. No pneumothorax. The heart, hila, mediastinum, lungs, and pleura are unremarkable. No acute abnormalities. IMPRESSION: No active cardiopulmonary disease. Electronically Signed   By: DaDorise BullionII M.D.   On: 11/26/2021 13:20    Microbiology: Results for orders placed or performed during the hospital encounter of 11/26/21  SARS Coronavirus 2 by RT PCR (hospital order[facility] *cepheid single result test* Anterior Nasal Swab  Status: None   Collection Time: 11/26/21  3:21 PM   Specimen: Anterior Nasal Swab  Result Value Ref Range Status   SARS Coronavirus 2 by RT PCR NEGATIVE NEGATIVE Final    Comment: (NOTE) SARS-CoV-2 target nucleic acids are NOT DETECTED.  The SARS-CoV-2 RNA is generally detectable in upper and  lower respiratory specimens during the acute phase of infection. The lowest concentration of SARS-CoV-2 viral copies this assay can detect is 250 copies / mL. A negative result does not preclude SARS-CoV-2 infection and should not be used as the sole basis for treatment or other patient management decisions.  A negative result may occur with improper specimen collection / handling, submission of specimen other than nasopharyngeal swab, presence of viral mutation(s) within the areas targeted by this assay, and inadequate number of viral copies (<250 copies / mL). A negative result must be combined with clinical observations, patient history, and epidemiological information.  Fact Sheet for Patients:   https://www.patel.info/  Fact Sheet for Healthcare Providers: https://hall.com/  This test is not yet approved or  cleared by the [place] FDA and has been authorized for detection and/or diagnosis of SARS-CoV-2 by FDA under an Emergency Use Authorization (EUA).  This EUA will remain in effect (meaning this test can be used) for the duration of the COVID-19 declaration under Section 564(b)(1) of the Act, 21 U.S.C. section 360bbb-3(b)(1), unless the authorization is terminated or revoked sooner.  Performed at [facility], [place] [address], [place],  [address]     Labs: CBC: Recent Labs  Lab 11/26/21 1327 11/27/21 0548 11/28/21 0901  WBC 4.9 4.9 5.3  NEUTROABS 3.6  --   --   HGB 10.0* 9.1* 9.2*  HCT 30.7* 29.0* 29.0*  MCV 90.0 91.5 91.5  PLT 217 228 438   Basic Metabolic Panel: Recent Labs  Lab 11/26/21 1327 11/27/21 0548 11/28/21 0901  NA 138 139 136  K 2.6* 3.6 3.4*  CL 102 105 103  CO2 28 28 26   GLUCOSE 128* 114* 120*  BUN 6 <5* <5*  CREATININE 1.19* 0.96 1.05*  CALCIUM 9.2 8.9 9.1  MG 1.5*  --   --   PHOS 2.5  --   --    Liver Function Tests: Recent Labs  Lab 11/26/21 1327  11/27/21 0548  AST 26 26  ALT 18 16  ALKPHOS 70 65  BILITOT 1.0 0.7  PROT 7.0 6.5  ALBUMIN 3.3* 3.0*   CBG: Recent Labs  Lab 11/26/21 1251 11/28/21 0710  GLUCAP 131* 101*    Discharge time spent: approximately 35 minutes spent on discharge counseling, evaluation of patient on day of discharge, and coordination of discharge planning with nursing, social work, pharmacy and case management  Signed: Edwin Dada, MD [facility] 11/28/2021

## 2021-11-29 ENCOUNTER — Telehealth: Payer: Self-pay | Admitting: *Deleted

## 2021-11-29 ENCOUNTER — Encounter: Payer: Self-pay | Admitting: *Deleted

## 2021-11-29 DIAGNOSIS — C50919 Malignant neoplasm of unspecified site of unspecified female breast: Secondary | ICD-10-CM

## 2021-11-29 NOTE — Telephone Encounter (Signed)
Received call from pt stating she was discharged from the hospital yesterday for tx of hypokalemia.  Pt requesting hospital f/u with MD and possible IVF.  Appt scheduled and pt verbalized understanding of appt date and time.

## 2021-11-30 ENCOUNTER — Other Ambulatory Visit: Payer: Self-pay | Admitting: *Deleted

## 2021-11-30 ENCOUNTER — Inpatient Hospital Stay: Payer: 59

## 2021-11-30 ENCOUNTER — Inpatient Hospital Stay (HOSPITAL_BASED_OUTPATIENT_CLINIC_OR_DEPARTMENT_OTHER): Payer: 59 | Admitting: Hematology and Oncology

## 2021-11-30 ENCOUNTER — Other Ambulatory Visit: Payer: Self-pay

## 2021-11-30 VITALS — BP 106/69 | HR 95 | Temp 99.4°F | Resp 18

## 2021-11-30 DIAGNOSIS — C50919 Malignant neoplasm of unspecified site of unspecified female breast: Secondary | ICD-10-CM

## 2021-11-30 DIAGNOSIS — Z170 Estrogen receptor positive status [ER+]: Secondary | ICD-10-CM | POA: Diagnosis not present

## 2021-11-30 DIAGNOSIS — E876 Hypokalemia: Secondary | ICD-10-CM

## 2021-11-30 DIAGNOSIS — C50511 Malignant neoplasm of lower-outer quadrant of right female breast: Secondary | ICD-10-CM | POA: Diagnosis not present

## 2021-11-30 DIAGNOSIS — Z5112 Encounter for antineoplastic immunotherapy: Secondary | ICD-10-CM | POA: Diagnosis not present

## 2021-11-30 DIAGNOSIS — R110 Nausea: Secondary | ICD-10-CM

## 2021-11-30 DIAGNOSIS — Z95828 Presence of other vascular implants and grafts: Secondary | ICD-10-CM

## 2021-11-30 LAB — CMP (CANCER CENTER ONLY)
ALT: 14 U/L (ref 0–44)
AST: 23 U/L (ref 15–41)
Albumin: 3.7 g/dL (ref 3.5–5.0)
Alkaline Phosphatase: 81 U/L (ref 38–126)
Anion gap: 12 (ref 5–15)
BUN: 7 mg/dL (ref 6–20)
CO2: 29 mmol/L (ref 22–32)
Calcium: 9.2 mg/dL (ref 8.9–10.3)
Chloride: 101 mmol/L (ref 98–111)
Creatinine: 0.96 mg/dL (ref 0.44–1.00)
GFR, Estimated: >60 mL/min (ref >60)
Glucose, Bld: 138 mg/dL — ABNORMAL HIGH (ref 70–99)
Potassium: 3.5 mmol/L (ref 3.5–5.1)
Sodium: 142 mmol/L (ref 135–145)
Total Bilirubin: 0.8 mg/dL (ref 0.3–1.2)
Total Protein: 7.1 g/dL (ref 6.5–8.1)

## 2021-11-30 LAB — CBC WITH DIFFERENTIAL (CANCER CENTER ONLY)
Abs Immature Granulocytes: 0.03 10*3/uL (ref 0.00–0.07)
Basophils Absolute: 0.1 10*3/uL (ref 0.0–0.1)
Basophils Relative: 1 %
Eosinophils Absolute: 0.3 10*3/uL (ref 0.0–0.5)
Eosinophils Relative: 5 %
HCT: 30.1 % — ABNORMAL LOW (ref 36.0–46.0)
Hemoglobin: 10.1 g/dL — ABNORMAL LOW (ref 12.0–15.0)
Immature Granulocytes: 1 %
Lymphocytes Relative: 24 %
Lymphs Abs: 1.3 10*3/uL (ref 0.7–4.0)
MCH: 29.4 pg (ref 26.0–34.0)
MCHC: 33.6 g/dL (ref 30.0–36.0)
MCV: 87.8 fL (ref 80.0–100.0)
Monocytes Absolute: 0.6 10*3/uL (ref 0.1–1.0)
Monocytes Relative: 11 %
Neutro Abs: 3.3 10*3/uL (ref 1.7–7.7)
Neutrophils Relative %: 58 %
Platelet Count: 382 10*3/uL (ref 150–400)
RBC: 3.43 MIL/uL — ABNORMAL LOW (ref 3.87–5.11)
RDW: 18.6 % — ABNORMAL HIGH (ref 11.5–15.5)
WBC Count: 5.6 10*3/uL (ref 4.0–10.5)
nRBC: 0.4 % — ABNORMAL HIGH (ref 0.0–0.2)

## 2021-11-30 LAB — MAGNESIUM: Magnesium: 1.4 mg/dL — ABNORMAL LOW (ref 1.7–2.4)

## 2021-11-30 MED ORDER — POTASSIUM CHLORIDE 10 MEQ/100ML IV SOLN: 10.0000 meq | Freq: Once | INTRAVENOUS | Status: DC

## 2021-11-30 MED ORDER — SODIUM CHLORIDE 0.9 % IV SOLN: INTRAVENOUS | Status: DC

## 2021-11-30 MED ORDER — SODIUM CHLORIDE 0.9% FLUSH
10.0000 mL | INTRAVENOUS | Status: DC | PRN
  Administered 2021-11-30: 10 mL via INTRAVENOUS

## 2021-11-30 MED ORDER — SODIUM CHLORIDE 0.9% FLUSH
10.0000 mL | Freq: Once | INTRAVENOUS | Status: AC
  Administered 2021-11-30: 10 mL

## 2021-11-30 MED ORDER — HEPARIN SOD (PORK) LOCK FLUSH 100 UNIT/ML IV SOLN
500.0000 [IU] | Freq: Once | INTRAVENOUS | Status: AC
  Administered 2021-11-30: 500 [IU]

## 2021-11-30 MED ORDER — POTASSIUM CHLORIDE 10 MEQ/100ML IV SOLN
10.0000 meq | Freq: Once | INTRAVENOUS | Status: AC
  Administered 2021-11-30: 10 meq via INTRAVENOUS
  Filled 2021-11-30: qty 100

## 2021-11-30 MED ORDER — SODIUM CHLORIDE 0.9 % IV SOLN
10.0000 mg | Freq: Once | INTRAVENOUS | Status: DC
  Filled 2021-11-30: qty 1

## 2021-11-30 NOTE — Progress Notes (Signed)
Verbal orders received from MD for pt to receive 1 L NS with 10 mEq IV potassium today and Saturday 12/02/21.  Orders placed under sign and held.

## 2021-11-30 NOTE — Progress Notes (Signed)
Patient Care Team: [name], MD as PCP - General (Internal Medicine) Mcarthur Rossetti, MD as Consulting Physician (Orthopedic Surgery) Mauro Kaufmann, RN as Oncology Nurse Navigator Rockwell Germany, RN as Oncology Nurse Navigator [name], MD as Consulting Physician (General Surgery) Nicholas Lose, MD as Consulting Physician (Hematology and Oncology) Eppie Gibson, MD as Attending Physician (Radiation Oncology)  DIAGNOSIS:  Encounter Diagnosis  Name Primary?   Malignant neoplasm of lower-outer quadrant of right breast of female, estrogen receptor positive ([place])     SUMMARY OF ONCOLOGIC HISTORY: Oncology History  Malignant neoplasm of lower-outer quadrant of right breast of female, estrogen receptor positive ([place])  05/03/2020 Initial Diagnosis   Screening mammogram showed a 1.0cm mass at the 6 o'clock position in the right breast. Diagnostic mammogram and US showed the 1.2cm mass at the 6 o'clock position in the right breast. Biopsy showed IDC, grade 3, HER-2 positive (3+), ER 15% weak, PR-, Ki67 80%.   05/11/2020 Cancer Staging   Staging form: Breast, AJCC 8th Edition - Clinical stage from 05/11/2020: Stage IA (cT1b, cN0, cM0, G3, ER+, PR-, HER2+) - Signed by Nicholas Lose, MD on 05/11/2020 Stage prefix: Initial diagnosis   05/19/2020 Genetic Testing   Negative hereditary cancer genetic testing: no pathogenic variants detected in [facility] Breast STAT Panel and Common Hereditary Cancers Panel.  The report dates are May 19, 2020 (STAT) and May 23, 2020 (Common Hereditary).   The Common Hereditary Cancers Panel offered by [facility] includes sequencing and/or deletion duplication testing of the following 47 genes: APC, ATM, AXIN2, BARD1, BMPR1A, BRCA1, BRCA2, BRIP1, CDH1, CDK4, CDKN2A (p14ARF), CDKN2A (p16INK4a), CHEK2, CTNNA1, DICER1, EPCAM (Deletion/duplication testing only), GREM1 (promoter region deletion/duplication testing only), GREM1, HOXB13, KIT, MEN1, MLH1, MSH2,  MSH3, MSH6, MUTYH, NBN, NF1, NHTL1, PALB2, PDGFRA, PMS2, POLD1, POLE, PTEN, RAD50, RAD51C, RAD51D, SDHA, SDHB, SDHC, SDHD, SMAD4, SMARCA4. STK11, TP53, TSC1, TSC2, and VHL.  The following genes were evaluated for sequence changes only: SDHA and HOXB13 c.251G>A variant only.es only: SDHA and HOXB13 c.251G>A variant only.   06/02/2020 Surgery   Right lumpectomy ([facility]): invasive and in situ ductal carcinoma, 1.3cm, clear margins, 1 right axillary lymph node negative for carcinoma.   06/02/2020 Cancer Staging   Staging form: Breast, AJCC 8th Edition - Pathologic stage from 06/02/2020: Stage IA (pT1c, pN0, cM0, G3, ER+, PR-, HER2+) - Signed by Gardenia Phlegm, NP on 06/15/2020 Stage prefix: Initial diagnosis Histologic grading system: 3 grade system   07/08/2020 - 10/21/2020 Chemotherapy   Taxol Herceptin followed by Herceptin maintenance   11/11/2020 -  Chemotherapy   Herceptin maintenance       04/14/2021 -  Anti-estrogen oral therapy   Tamoxifen 10 mg   08/21/2021 - 10/26/2021 Chemotherapy   Patient is on Treatment Plan : BREAST METASTATIC fam-trastuzumab deruxtecan-nxki (Enhertu) q21d     11/15/2021 -  Chemotherapy   Patient is on Treatment Plan : BREAST METASTATIC Fam-Trastuzumab Deruxtecan-nxki (Enhertu) (5.4) q21d       CHIEF COMPLIANT: Follow-up on Enhertu cycle 6  INTERVAL HISTORY: Pamela [name] is a 56 y.o. with above-mentioned history of metastatic breast cancer. She reports to the clinic today for a follow up. She states that she takes imodium it only last for a few hours. She has no energy. Diarrhea and nausea is awful. She reports that diarrhea is everyday all day. She takes imodium. She doesn't have an appetite. She has lost a significant amount a weight.   ALLERGIES:  is allergic to pollen extract,  compazine [prochlorperazine], latex, codeine, shellfish allergy, and betadine [povidone iodine].  MEDICATIONS:  Current Outpatient Medications  Medication Sig  Dispense Refill   amLODipine (NORVASC) 10 MG tablet Take 1 tablet (10 mg total) by mouth daily. 90 tablet 2   aspirin EC 81 MG tablet Take 1 tablet (81 mg total) by mouth daily. Swallow whole. 30 tablet 11   Azilsartan-Chlorthalidone (EDARBYCLOR) 40-25 MG TABS Take 1 tablet by mouth daily. 90 tablet 2   dexamethasone (DECADRON) 4 MG tablet Take 1 tablet (4 mg total) by mouth 2 (two) times daily with a meal. Day 2,3,4 one tablet with lunch 30 tablet 1   diphenoxylate-atropine (LOMOTIL) 2.5-0.025 MG tablet Take 1 tablet by mouth 4 (four) times daily as needed for diarrhea or loose stools. 30 tablet 0   levonorgestrel (MIRENA, 52 MG,) 20 MCG/DAY IUD 1 each by Intrauterine route once.     lidocaine-prilocaine (EMLA) cream Apply 1 application topically daily as needed. (Patient taking differently: Apply 1 application  topically daily as needed (port access).) 60 g 0   LORazepam (ATIVAN) 0.5 MG tablet Take 1 tablet (0.5 mg total) by mouth every 8 (eight) hours as needed for anxiety. 30 tablet 0   montelukast (SINGULAIR) 10 MG tablet Take 1 tablet (10 mg total) by mouth daily. (Patient taking differently: Take 10 mg by mouth daily as needed (allergies).) 90 tablet 0   oxyCODONE (OXY IR/ROXICODONE) 5 MG immediate release tablet Take 1 tablet (5 mg total) by mouth every 6 (six) hours as needed for severe pain. 15 tablet 0   pantoprazole (PROTONIX) 40 MG tablet Take 1 tablet (40 mg total) by mouth daily. 30 tablet 1   potassium chloride SA (KLOR-CON M) 20 MEQ tablet Take 1 tablet (20 mEq total) by mouth daily. 30 tablet 0   prochlorperazine (COMPAZINE) 10 MG tablet Take 10 mg by mouth every 6 (six) hours as needed for nausea or vomiting.     rosuvastatin (CRESTOR) 40 MG tablet Take 1 tablet (40 mg total) by mouth daily. 90 tablet 3   No current facility-administered medications for this visit.   Facility-Administered Medications Ordered in Other Visits  Medication Dose Route Frequency Provider Last Rate  Last Admin   0.9 %  sodium chloride infusion   Intravenous Continuous Causey, Charlestine Massed, NP       heparin lock flush 100 unit/mL  500 Units Intracatheter Once Nicholas Lose, MD       [START ON 12/02/2021] potassium chloride 10 mEq in 100 mL IVPB  10 mEq Intravenous Once Causey, Charlestine Massed, NP       potassium chloride 10 mEq in 100 mL IVPB  10 mEq Intravenous Once Causey, Charlestine Massed, NP       sodium chloride flush (NS) 0.9 % injection 10 mL  10 mL Intracatheter Once Nicholas Lose, MD        PHYSICAL EXAMINATION: ECOG PERFORMANCE STATUS: 1 - Symptomatic but completely ambulatory  Vitals:   11/30/21 1335  BP: 98/80  Pulse: (!) 115  Resp: 18  Temp: (!) 97.2 F (36.2 C)  SpO2: 100%   Filed Weights   11/30/21 1335  Weight: 293 lb 1.6 oz (132.9 kg)      LABORATORY DATA:  I have reviewed the data as listed    Latest Ref Rng & Units 11/30/2021    1:14 PM 11/28/2021    9:01 AM 11/27/2021    5:48 AM  CMP  Glucose 70 - 99 mg/dL 138  120  114  BUN 6 - 20 mg/dL 7  <5  <5   Creatinine 0.44 - 1.00 mg/dL 0.96  1.05  0.96   Sodium 135 - 145 mmol/L 142  136  139   Potassium 3.5 - 5.1 mmol/L 3.5  3.4  3.6   Chloride 98 - 111 mmol/L 101  103  105   CO2 22 - 32 mmol/L 29  26  28    Calcium 8.9 - 10.3 mg/dL 9.2  9.1  8.9   Total Protein 6.5 - 8.1 g/dL 7.1   6.5   Total Bilirubin 0.3 - 1.2 mg/dL 0.8   0.7   Alkaline Phos 38 - 126 U/L 81   65   AST 15 - 41 U/L 23   26   ALT 0 - 44 U/L 14   16     Lab Results  Component Value Date   WBC 5.6 11/30/2021   HGB 10.1 (L) 11/30/2021   HCT 30.1 (L) 11/30/2021   MCV 87.8 11/30/2021   PLT 382 11/30/2021   NEUTROABS 3.3 11/30/2021    ASSESSMENT & PLAN:  Malignant neoplasm of lower-outer quadrant of right breast of female, estrogen receptor positive ([place]) 05/03/2020:Screening mammogram showed a 1.0cm mass at the 6 o'clock position in the right breast. Diagnostic mammogram and US showed the 1.2cm mass at the 6 o'clock position in  the right breast. Biopsy showed IDC, grade 3, HER-2 positive (3+), ER 15% weak, PR-, Ki67 80%.   06/02/2020:Right lumpectomy ([facility]): invasive and in situ ductal carcinoma, 1.3cm, clear margins, 1 right axillary lymph node negative for carcinoma. ER 15% weak, PR-,HER-2 positive (3+) Ki67 80%.   Treatment plan: 1.  Adjuvant chemotherapy with Taxol Herceptin followed by Herceptin maintenance.  Stopped after 11 cycles of Taxol Herceptin maintenance completed 07/07/2021  2.  Adjuvant radiation therapy 11/23/2020-12/19/2020 3.  Adjuvant antiestrogen therapy with tamoxifen started October 2022 4.  CT angiogram 07/13/2021: Right supraclavicular lymph node 5 cm, right internal mammary lymph nodes 1.8 cm 5.  Metastatic breast cancer: June 2023:Right supraclavicular lymphadenopathy: Biopsy: Metastatic poorly differentiated adenocarcinoma, ER +40%, PR negative, HER2 positive 3+ PET CT scan 08/06/21: Ext Right Supra clav LN along with Internal mammary and Rt Upper Mediastinal and Rt Axillary LN -----------------------------------------------------------------------------------------------------  Current treatment: Enhertu started on 08/21/2021, (this treatment on hold) Enhertu toxicities: 1,.  Severe nausea, requiring IV fluids 2. severe diarrhea in spite of   Lomotil and Imodium.  I sent in a prescription for Lomotil. 3.  Severe fatigue: She is using a wheelchair for getting around. 4.  Chemotherapy-induced anemia: Monitoring closely 5.  Hospitalization 11/26/2021-11/28/2021: Dizziness and fatigue, esophageal dysmotility   I discussed with her that we will hold off on doing any further treatments until she recovers from the current diarrhea and fatigue issues. We will bring her in next Saturday for some more fluids.  She will also receive potassium. Our plan is to not do Enhertu treatment next week.  No orders of the defined types were placed in this encounter.  The patient has a good understanding of the  overall plan. she agrees with it. she will call with any problems that may develop before the next visit here. Total time spent: 30 mins including face to face time and time spent for planning, charting and co-ordination of care   Harriette Ohara, MD 11/30/21    I Gardiner Coins am scribing for Dr. Lindi Adie  I have reviewed the above documentation for accuracy and completeness, and I agree with the above.

## 2021-11-30 NOTE — Assessment & Plan Note (Addendum)
05/03/2020:Screening mammogram showed a 1.0cm mass at the 6 o'clock position in the right breast. Diagnostic mammogram and US showed the 1.2cm mass at the 6 o'clock position in the right breast. Biopsy showed IDC, grade 3, HER-2 positive (3+), ER 15% weak, PR-, Ki67 80%.  06/02/2020:Right lumpectomy ([facility]): invasive and in situ ductal carcinoma, 1.3cm, clear margins, 1 right axillary lymph node negative for carcinoma. ER 15% weak, PR-,HER-2 positive (3+) Ki67 80%.  Treatment plan: 1.Adjuvant chemotherapy with Taxol Herceptinfollowed byHerceptin maintenance.Stopped after 11 cycles of TaxolHerceptin maintenance completed 07/07/2021 2.Adjuvant radiation therapy9/21/2022-12/19/2020 3.Adjuvant antiestrogen therapywith tamoxifen started October 2022 4.CT angiogram 07/13/2021: Right supraclavicular lymph node 5 cm, right internal mammary lymph nodes 1.8 cm 5.Metastatic breast cancer: June 2023:Right supraclavicular lymphadenopathy:Biopsy: Metastatic poorly differentiated adenocarcinoma, ER +40%, PR negative, HER2 positive 3+ PET CT scan6/4/23: Ext Right Supra clav LN along with Internal mammary and Rt Upper Mediastinal and Rt Axillary LN ----------------------------------------------------------------------------------------------------- Current treatment:Enhertustartedon 08/21/2021, today is cycle6 Enhertu toxicities: 1,.Severe nausea, requiring IV fluids 2.severe diarrhea lasting 3 to 4 days requiring Lomotil and Imodium 3.Severe fatigue 4.Chemotherapy-induced anemia: Monitoring closely 5.  Hospitalization 11/26/2021-11/28/2021: Dizziness and fatigue, esophageal dysmotility

## 2021-11-30 NOTE — Patient Instructions (Signed)
Potassium Chloride Injection What is this medication? POTASSIUM CHLORIDE (poe TASS i um KLOOR ide) prevents and treats low levels of potassium in your body. Potassium plays an important role in maintaining the health of your kidneys, heart, muscles, and nervous system. This medicine may be used for other purposes; ask your health care provider or pharmacist if you have questions. COMMON BRAND NAME(S): PROAMP What should I tell my care team before I take this medication? They need to know if you have any of these conditions: Addison disease Dehydration Diabetes (high blood sugar) Heart disease High levels of potassium in the blood Irregular heartbeat or rhythm Kidney disease Large areas of burned skin An unusual or allergic reaction to potassium, other medications, foods, dyes, or preservatives Pregnant or trying to get pregnant Breast-feeding How should I use this medication? This medication is injected into a vein. It is given in a hospital or clinic setting. Talk to your care team about the use of this medication in children. Special care may be needed. Overdosage: If you think you have taken too much of this medicine contact a poison control center or emergency room at once. NOTE: This medicine is only for you. Do not share this medicine with others. What if I miss a dose? This does not apply. This medication is not for regular use. What may interact with this medication? Do not take this medication with any of the following: Certain diuretics such as spironolactone, triamterene Eplerenone Sodium polystyrene sulfonate This medication may also interact with the following: Certain medications for blood pressure or heart disease like lisinopril, losartan, quinapril, valsartan Medications that lower your chance of fighting infection such as cyclosporine, tacrolimus NSAIDs, medications for pain and inflammation, like ibuprofen or naproxen Other potassium supplements Salt  substitutes This list may not describe all possible interactions. Give your health care provider a list of all the medicines, herbs, non-prescription drugs, or dietary supplements you use. Also tell them if you smoke, drink alcohol, or use illegal drugs. Some items may interact with your medicine. What should I watch for while using this medication? Visit your care team for regular checks on your progress. Tell your care team if your symptoms do not start to get better or if they get worse. You may need blood work while you are taking this medication. Avoid salt substitutes unless you are told otherwise by your care team. What side effects may I notice from receiving this medication? Side effects that you should report to your care team as soon as possible: Allergic reactions--skin rash, itching, hives, swelling of the face, lips, tongue, or throat High potassium level--muscle weakness, fast or irregular heartbeat Side effects that usually do not require medical attention (report to your care team if they continue or are bothersome): Diarrhea Nausea Stomach pain Vomiting This list may not describe all possible side effects. Call your doctor for medical advice about side effects. You may report side effects to FDA at 1-800-FDA-1088. Where should I keep my medication? This medication is given in a hospital or clinic. It will not be stored at home. NOTE: This sheet is a summary. It may not cover all possible information. If you have questions about this medicine, talk to your doctor, pharmacist, or health care provider.  2023 Elsevier/Gold Standard (2020-06-02 00:00:00) Dehydration, Adult Dehydration is a condition in which there is not enough water or other fluids in the body. This happens when a person loses more fluids than he or she takes in. Important organs, such as  the kidneys, brain, and heart, cannot function without a proper amount of fluids. Any loss of fluids from the body can lead to  dehydration. Dehydration can be mild, moderate, or severe. It should be treated right away to prevent it from becoming severe. What are the causes? Dehydration may be caused by: Conditions that cause loss of water or other fluids, such as diarrhea, vomiting, or sweating or urinating a lot. Not drinking enough fluids, especially when you are ill or doing activities that require a lot of energy. Other illnesses and conditions, such as fever or infection. Certain medicines, such as medicines that remove excess fluid from the body (diuretics). Lack of safe drinking water. Not being able to get enough water and food. What increases the risk? The following factors may make you more likely to develop this condition: Having a long-term (chronic) illness that has not been treated properly, such as diabetes, heart disease, or kidney disease. Being 55 years of age or older. Having a disability. Living in a place that is high in altitude, where thinner, drier air causes more fluid loss. Doing exercises that put stress on your body for a long time (endurance sports). What are the signs or symptoms? Symptoms of dehydration depend on how severe it is. Mild or moderate dehydration Thirst. Dry lips or dry mouth. Dizziness or light-headedness, especially when standing up from a seated position. Muscle cramps. Dark urine. Urine may be the color of tea. Less urine or tears produced than usual. Headache. Severe dehydration Changes in skin. Your skin may be cold and clammy, blotchy, or pale. Your skin also may not return to normal after being lightly pinched and released. Little or no tears, urine, or sweat. Changes in vital signs, such as rapid breathing and low blood pressure. Your pulse may be weak or may be faster than 100 beats a minute when you are sitting still. Other changes, such as: Feeling very thirsty. Sunken eyes. Cold hands and feet. Confusion. Being very tired (lethargic) or having  trouble waking from sleep. Short-term weight loss. Loss of consciousness. How is this diagnosed? This condition is diagnosed based on your symptoms and a physical exam. You may have blood and urine tests to help confirm the diagnosis. How is this treated? Treatment for this condition depends on how severe it is. Treatment should be started right away. Do not wait until dehydration becomes severe. Severe dehydration is an emergency and needs to be treated in a hospital. Mild or moderate dehydration can be treated at home. You may be asked to: Drink more fluids. Drink an oral rehydration solution (ORS). This drink helps restore proper amounts of fluids and salts and minerals in the blood (electrolytes). Severe dehydration can be treated: With IV fluids. By correcting abnormal levels of electrolytes. This is often done by giving electrolytes through a tube that is passed through your nose and into your stomach (nasogastric tube, or NG tube). By treating the underlying cause of dehydration. Follow these instructions at home: Oral rehydration solution If told by your health care provider, drink an ORS: Make an ORS by following instructions on the package. Start by drinking small amounts, about  cup (120 mL) every 5-10 minutes. Slowly increase how much you drink until you have taken the amount recommended by your health care provider. Eating and drinking        Drink enough clear fluid to keep your urine pale yellow. If you were told to drink an ORS, finish the ORS first and then  start slowly drinking other clear fluids. Drink fluids such as: Water. Do not drink only water. Doing that can lead to hyponatremia, which is having too little salt (sodium) in the body. Water from ice chips you suck on. Fruit juice that you have added water to (diluted fruit juice). Low-calorie sports drinks. Eat foods that contain a healthy balance of electrolytes, such as bananas, oranges, potatoes, tomatoes,  and spinach. Do not drink alcohol. Avoid the following: Drinks that contain a lot of sugar. These include high-calorie sports drinks, fruit juice that is not diluted, and soda. Caffeine. Foods that are greasy or contain a lot of fat or sugar. General instructions Take over-the-counter and prescription medicines only as told by your health care provider. Do not take sodium tablets. Doing that can lead to having too much sodium in the body (hypernatremia). Return to your normal activities as told by your health care provider. Ask your health care provider what activities are safe for you. Keep all follow-up visits as told by your health care provider. This is important. Contact a health care provider if: You have muscle cramps, pain, or discomfort, such as: Pain in your abdomen and the pain gets worse or stays in one area (localizes). Stiff neck. You have a rash. You are more irritable than usual. You are sleepier or have a harder time waking than usual. You feel weak or dizzy. You feel very thirsty. Get help right away if you have: Any symptoms of severe dehydration. Symptoms of vomiting, such as: You cannot eat or drink without vomiting. Vomiting gets worse or does not go away. Vomit includes blood or green matter (bile). Symptoms that get worse with treatment. A fever. A severe headache. Problems with urination or bowel movements, such as: Diarrhea that gets worse or does not go away. Blood in your stool (feces). This may cause stool to look black and tarry. Not urinating, or urinating only a small amount of very dark urine, within 6-8 hours. Trouble breathing. These symptoms may represent a serious problem that is an emergency. Do not wait to see if the symptoms will go away. Get medical help right away. Call your local emergency services (911 in the [place] Do not drive yourself to the hospital. Summary Dehydration is a condition in which there is not enough water or other  fluids in the body. This happens when a person loses more fluids than he or she takes in. Treatment for this condition depends on how severe it is. Treatment should be started right away. Do not wait until dehydration becomes severe. Drink enough clear fluid to keep your urine pale yellow. If you were told to drink an oral rehydration solution (ORS), finish the ORS first and then start slowly drinking other clear fluids. Take over-the-counter and prescription medicines only as told by your health care provider. Get help right away if you have any symptoms of severe dehydration. This information is not intended to replace advice given to you by your health care provider. Make sure you discuss any questions you have with your health care provider. Document Revised: 06/28/2021 Document Reviewed: 10/02/2018 Elsevier Patient Education  [place].

## 2021-12-02 ENCOUNTER — Inpatient Hospital Stay: Payer: 59

## 2021-12-02 VITALS — BP 119/64 | HR 85 | Temp 98.3°F | Resp 18 | Ht 69.0 in

## 2021-12-02 DIAGNOSIS — C50511 Malignant neoplasm of lower-outer quadrant of right female breast: Secondary | ICD-10-CM

## 2021-12-02 DIAGNOSIS — C50919 Malignant neoplasm of unspecified site of unspecified female breast: Secondary | ICD-10-CM

## 2021-12-02 DIAGNOSIS — Z5112 Encounter for antineoplastic immunotherapy: Secondary | ICD-10-CM | POA: Diagnosis not present

## 2021-12-02 DIAGNOSIS — Z95828 Presence of other vascular implants and grafts: Secondary | ICD-10-CM

## 2021-12-02 DIAGNOSIS — E876 Hypokalemia: Secondary | ICD-10-CM

## 2021-12-02 DIAGNOSIS — R110 Nausea: Secondary | ICD-10-CM

## 2021-12-02 MED ORDER — CYANOCOBALAMIN 1000 MCG/ML IJ SOLN
1000.0000 ug | Freq: Once | INTRAMUSCULAR | Status: AC
  Administered 2021-12-02: 1000 ug via INTRAMUSCULAR
  Filled 2021-12-02: qty 1

## 2021-12-02 MED ORDER — SODIUM CHLORIDE 0.9 % IV SOLN: INTRAVENOUS | Status: DC

## 2021-12-02 MED ORDER — SODIUM CHLORIDE 0.9 % IV SOLN
10.0000 mg | Freq: Once | INTRAVENOUS | Status: DC
  Filled 2021-12-02: qty 1

## 2021-12-02 MED ORDER — SODIUM CHLORIDE 0.9% FLUSH
10.0000 mL | Freq: Once | INTRAVENOUS | Status: AC
  Administered 2021-12-02: 10 mL

## 2021-12-02 MED ORDER — POTASSIUM CHLORIDE 10 MEQ/100ML IV SOLN
10.0000 meq | Freq: Once | INTRAVENOUS | Status: AC
  Administered 2021-12-02: 10 meq via INTRAVENOUS
  Filled 2021-12-02: qty 100

## 2021-12-02 MED ORDER — HEPARIN SOD (PORK) LOCK FLUSH 100 UNIT/ML IV SOLN
500.0000 [IU] | Freq: Once | INTRAVENOUS | Status: AC
  Administered 2021-12-02: 500 [IU]

## 2021-12-05 NOTE — Progress Notes (Signed)
Patient Care Team: [name], MD as PCP - General (Internal Medicine) Mcarthur Rossetti, MD as Consulting Physician (Orthopedic Surgery) Mauro Kaufmann, RN as Oncology Nurse Navigator Rockwell Germany, RN as Oncology Nurse Navigator [name], MD as Consulting Physician (General Surgery) Nicholas Lose, MD as Consulting Physician (Hematology and Oncology) Eppie Gibson, MD as Attending Physician (Radiation Oncology)  DIAGNOSIS: No diagnosis found.  SUMMARY OF ONCOLOGIC HISTORY: Oncology History  Malignant neoplasm of lower-outer quadrant of right breast of female, estrogen receptor positive ([place])  05/03/2020 Initial Diagnosis   Screening mammogram showed a 1.0cm mass at the 6 o'clock position in the right breast. Diagnostic mammogram and US showed the 1.2cm mass at the 6 o'clock position in the right breast. Biopsy showed IDC, grade 3, HER-2 positive (3+), ER 15% weak, PR-, Ki67 80%.   05/11/2020 Cancer Staging   Staging form: Breast, AJCC 8th Edition - Clinical stage from 05/11/2020: Stage IA (cT1b, cN0, cM0, G3, ER+, PR-, HER2+) - Signed by Nicholas Lose, MD on 05/11/2020 Stage prefix: Initial diagnosis   05/19/2020 Genetic Testing   Negative hereditary cancer genetic testing: no pathogenic variants detected in [facility] Breast STAT Panel and Common Hereditary Cancers Panel.  The report dates are May 19, 2020 (STAT) and May 23, 2020 (Common Hereditary).   The Common Hereditary Cancers Panel offered by [facility] includes sequencing and/or deletion duplication testing of the following 47 genes: APC, ATM, AXIN2, BARD1, BMPR1A, BRCA1, BRCA2, BRIP1, CDH1, CDK4, CDKN2A (p14ARF), CDKN2A (p16INK4a), CHEK2, CTNNA1, DICER1, EPCAM (Deletion/duplication testing only), GREM1 (promoter region deletion/duplication testing only), GREM1, HOXB13, KIT, MEN1, MLH1, MSH2, MSH3, MSH6, MUTYH, NBN, NF1, NHTL1, PALB2, PDGFRA, PMS2, POLD1, POLE, PTEN, RAD50, RAD51C, RAD51D, SDHA, SDHB, SDHC, SDHD,  SMAD4, SMARCA4. STK11, TP53, TSC1, TSC2, and VHL.  The following genes were evaluated for sequence changes only: SDHA and HOXB13 c.251G>A variant only.es only: SDHA and HOXB13 c.251G>A variant only.   06/02/2020 Surgery   Right lumpectomy ([facility]): invasive and in situ ductal carcinoma, 1.3cm, clear margins, 1 right axillary lymph node negative for carcinoma.   06/02/2020 Cancer Staging   Staging form: Breast, AJCC 8th Edition - Pathologic stage from 06/02/2020: Stage IA (pT1c, pN0, cM0, G3, ER+, PR-, HER2+) - Signed by Gardenia Phlegm, NP on 06/15/2020 Stage prefix: Initial diagnosis Histologic grading system: 3 grade system   07/08/2020 - 10/21/2020 Chemotherapy   Taxol Herceptin followed by Herceptin maintenance   11/11/2020 -  Chemotherapy   Herceptin maintenance       04/14/2021 -  Anti-estrogen oral therapy   Tamoxifen 10 mg   08/21/2021 - 10/26/2021 Chemotherapy   Patient is on Treatment Plan : BREAST METASTATIC fam-trastuzumab deruxtecan-nxki (Enhertu) q21d     11/15/2021 -  Chemotherapy   Patient is on Treatment Plan : BREAST METASTATIC Fam-Trastuzumab Deruxtecan-nxki (Enhertu) (5.4) q21d       CHIEF COMPLIANT: Follow-up to discuss Effexor  INTERVAL HISTORY: Pamela [name] is a 56 y.o. with the above-mentioned. She presents to the clinic for a follow-up.    ALLERGIES:  is allergic to pollen extract, compazine [prochlorperazine], latex, codeine, shellfish allergy, and betadine [povidone iodine].  MEDICATIONS:  Current Outpatient Medications  Medication Sig Dispense Refill   amLODipine (NORVASC) 10 MG tablet Take 1 tablet (10 mg total) by mouth daily. 90 tablet 2   aspirin EC 81 MG tablet Take 1 tablet (81 mg total) by mouth daily. Swallow whole. 30 tablet 11   Azilsartan-Chlorthalidone (EDARBYCLOR) 40-25 MG TABS Take 1 tablet by mouth daily.  90 tablet 2   dexamethasone (DECADRON) 4 MG tablet Take 1 tablet (4 mg total) by mouth 2 (two) times daily with a meal. Day  2,3,4 one tablet with lunch 30 tablet 1   diphenoxylate-atropine (LOMOTIL) 2.5-0.025 MG tablet Take 1 tablet by mouth 4 (four) times daily as needed for diarrhea or loose stools. 30 tablet 0   levonorgestrel (MIRENA, 52 MG,) 20 MCG/DAY IUD 1 each by Intrauterine route once.     lidocaine-prilocaine (EMLA) cream Apply 1 application topically daily as needed. (Patient taking differently: Apply 1 application  topically daily as needed (port access).) 60 g 0   LORazepam (ATIVAN) 0.5 MG tablet Take 1 tablet (0.5 mg total) by mouth every 8 (eight) hours as needed for anxiety. 30 tablet 0   montelukast (SINGULAIR) 10 MG tablet Take 1 tablet (10 mg total) by mouth daily. (Patient taking differently: Take 10 mg by mouth daily as needed (allergies).) 90 tablet 0   oxyCODONE (OXY IR/ROXICODONE) 5 MG immediate release tablet Take 1 tablet (5 mg total) by mouth every 6 (six) hours as needed for severe pain. 15 tablet 0   pantoprazole (PROTONIX) 40 MG tablet Take 1 tablet (40 mg total) by mouth daily. 30 tablet 1   potassium chloride SA (KLOR-CON M) 20 MEQ tablet Take 1 tablet (20 mEq total) by mouth daily. 30 tablet 0   prochlorperazine (COMPAZINE) 10 MG tablet Take 10 mg by mouth every 6 (six) hours as needed for nausea or vomiting.     rosuvastatin (CRESTOR) 40 MG tablet Take 1 tablet (40 mg total) by mouth daily. 90 tablet 3   No current facility-administered medications for this visit.   Facility-Administered Medications Ordered in Other Visits  Medication Dose Route Frequency Provider Last Rate Last Admin   heparin lock flush 100 unit/mL  500 Units Intracatheter Once Nicholas Lose, MD       sodium chloride flush (NS) 0.9 % injection 10 mL  10 mL Intracatheter Once Nicholas Lose, MD        PHYSICAL EXAMINATION: ECOG PERFORMANCE STATUS: {CHL ONC ECOG OA:[id number]}  There were no vitals filed for this visit. There were no vitals filed for this visit.  BREAST:*** No palpable masses or nodules in  either right or left breasts. No palpable axillary supraclavicular or infraclavicular adenopathy no breast tenderness or nipple discharge. (exam performed in the presence of a chaperone)  LABORATORY DATA:  I have reviewed the data as listed    Latest Ref Rng & Units 11/30/2021    1:14 PM 11/28/2021    9:01 AM 11/27/2021    5:48 AM  CMP  Glucose 70 - 99 mg/dL 138  120  114   BUN 6 - 20 mg/dL 7  <5  <5   Creatinine 0.44 - 1.00 mg/dL 0.96  1.05  0.96   Sodium 135 - 145 mmol/L 142  136  139   Potassium 3.5 - 5.1 mmol/L 3.5  3.4  3.6   Chloride 98 - 111 mmol/L 101  103  105   CO2 22 - 32 mmol/L 29  26  28    Calcium 8.9 - 10.3 mg/dL 9.2  9.1  8.9   Total Protein 6.5 - 8.1 g/dL 7.1   6.5   Total Bilirubin 0.3 - 1.2 mg/dL 0.8   0.7   Alkaline Phos 38 - 126 U/L 81   65   AST 15 - 41 U/L 23   26   ALT 0 - 44 U/L 14  16     Lab Results  Component Value Date   WBC 5.6 11/30/2021   HGB 10.1 (L) 11/30/2021   HCT 30.1 (L) 11/30/2021   MCV 87.8 11/30/2021   PLT 382 11/30/2021   NEUTROABS 3.3 11/30/2021    ASSESSMENT & PLAN:  No problem-specific Assessment & Plan notes found for this encounter.    No orders of the defined types were placed in this encounter.  The patient has a good understanding of the overall plan. she agrees with it. she will call with any problems that may develop before the next visit here. Total time spent: 30 mins including face to face time and time spent for planning, charting and co-ordination of care   Suzzette Righter, N[place]el 12/05/21    I Gardiner Coins am scribing for Dr. Lindi Adie  ***

## 2021-12-06 ENCOUNTER — Encounter: Payer: Self-pay | Admitting: *Deleted

## 2021-12-06 ENCOUNTER — Telehealth: Payer: Self-pay | Admitting: *Deleted

## 2021-12-06 NOTE — Telephone Encounter (Signed)
Received call from pt requesting return to work note for today 12/06/21.  RN successfully scanned note to pt email [email or web address]

## 2021-12-07 ENCOUNTER — Other Ambulatory Visit: Payer: Self-pay | Admitting: *Deleted

## 2021-12-07 ENCOUNTER — Inpatient Hospital Stay: Payer: 59 | Attending: Hematology and Oncology | Admitting: Hematology and Oncology

## 2021-12-07 ENCOUNTER — Inpatient Hospital Stay: Payer: 59

## 2021-12-07 ENCOUNTER — Other Ambulatory Visit: Payer: Self-pay

## 2021-12-07 DIAGNOSIS — E876 Hypokalemia: Secondary | ICD-10-CM | POA: Diagnosis not present

## 2021-12-07 DIAGNOSIS — Z885 Allergy status to narcotic agent status: Secondary | ICD-10-CM | POA: Insufficient documentation

## 2021-12-07 DIAGNOSIS — R110 Nausea: Secondary | ICD-10-CM

## 2021-12-07 DIAGNOSIS — Z793 Long term (current) use of hormonal contraceptives: Secondary | ICD-10-CM | POA: Insufficient documentation

## 2021-12-07 DIAGNOSIS — Z170 Estrogen receptor positive status [ER+]: Secondary | ICD-10-CM

## 2021-12-07 DIAGNOSIS — Z95828 Presence of other vascular implants and grafts: Secondary | ICD-10-CM

## 2021-12-07 DIAGNOSIS — T451X5A Adverse effect of antineoplastic and immunosuppressive drugs, initial encounter: Secondary | ICD-10-CM | POA: Insufficient documentation

## 2021-12-07 DIAGNOSIS — C50919 Malignant neoplasm of unspecified site of unspecified female breast: Secondary | ICD-10-CM

## 2021-12-07 DIAGNOSIS — C50511 Malignant neoplasm of lower-outer quadrant of right female breast: Secondary | ICD-10-CM | POA: Diagnosis present

## 2021-12-07 DIAGNOSIS — Z888 Allergy status to other drugs, medicaments and biological substances status: Secondary | ICD-10-CM | POA: Insufficient documentation

## 2021-12-07 DIAGNOSIS — D6481 Anemia due to antineoplastic chemotherapy: Secondary | ICD-10-CM | POA: Diagnosis not present

## 2021-12-07 DIAGNOSIS — R5383 Other fatigue: Secondary | ICD-10-CM | POA: Diagnosis not present

## 2021-12-07 DIAGNOSIS — Z79899 Other long term (current) drug therapy: Secondary | ICD-10-CM | POA: Diagnosis not present

## 2021-12-07 DIAGNOSIS — Z5112 Encounter for antineoplastic immunotherapy: Secondary | ICD-10-CM | POA: Insufficient documentation

## 2021-12-07 DIAGNOSIS — R197 Diarrhea, unspecified: Secondary | ICD-10-CM | POA: Diagnosis not present

## 2021-12-07 LAB — CBC WITH DIFFERENTIAL (CANCER CENTER ONLY)
Abs Immature Granulocytes: 0.04 10*3/uL (ref 0.00–0.07)
Basophils Absolute: 0 10*3/uL (ref 0.0–0.1)
Basophils Relative: 1 %
Eosinophils Absolute: 0.2 10*3/uL (ref 0.0–0.5)
Eosinophils Relative: 4 %
HCT: 29.9 % — ABNORMAL LOW (ref 36.0–46.0)
Hemoglobin: 9.7 g/dL — ABNORMAL LOW (ref 12.0–15.0)
Immature Granulocytes: 1 %
Lymphocytes Relative: 21 %
Lymphs Abs: 1.2 10*3/uL (ref 0.7–4.0)
MCH: 29.7 pg (ref 26.0–34.0)
MCHC: 32.4 g/dL (ref 30.0–36.0)
MCV: 91.4 fL (ref 80.0–100.0)
Monocytes Absolute: 0.9 10*3/uL (ref 0.1–1.0)
Monocytes Relative: 15 %
Neutro Abs: 3.4 10*3/uL (ref 1.7–7.7)
Neutrophils Relative %: 58 %
Platelet Count: 329 10*3/uL (ref 150–400)
RBC: 3.27 MIL/uL — ABNORMAL LOW (ref 3.87–5.11)
RDW: 19.5 % — ABNORMAL HIGH (ref 11.5–15.5)
WBC Count: 5.9 10*3/uL (ref 4.0–10.5)
nRBC: 0.5 % — ABNORMAL HIGH (ref 0.0–0.2)

## 2021-12-07 LAB — CMP (CANCER CENTER ONLY)
ALT: 13 U/L (ref 0–44)
AST: 22 U/L (ref 15–41)
Albumin: 3.6 g/dL (ref 3.5–5.0)
Alkaline Phosphatase: 71 U/L (ref 38–126)
Anion gap: 7 (ref 5–15)
BUN: 9 mg/dL (ref 6–20)
CO2: 31 mmol/L (ref 22–32)
Calcium: 8.7 mg/dL — ABNORMAL LOW (ref 8.9–10.3)
Chloride: 105 mmol/L (ref 98–111)
Creatinine: 0.85 mg/dL (ref 0.44–1.00)
GFR, Estimated: >60 mL/min
Glucose, Bld: 115 mg/dL — ABNORMAL HIGH (ref 70–99)
Potassium: 3 mmol/L — ABNORMAL LOW (ref 3.5–5.1)
Sodium: 143 mmol/L (ref 135–145)
Total Bilirubin: 0.9 mg/dL (ref 0.3–1.2)
Total Protein: 6.8 g/dL (ref 6.5–8.1)

## 2021-12-07 LAB — MAGNESIUM: Magnesium: 1.1 mg/dL — ABNORMAL LOW (ref 1.7–2.4)

## 2021-12-07 MED ORDER — SODIUM CHLORIDE 0.9% FLUSH
10.0000 mL | Freq: Once | INTRAVENOUS | Status: AC | PRN
  Administered 2021-12-07: 10 mL

## 2021-12-07 MED ORDER — POTASSIUM CHLORIDE 20 MEQ/100ML IV SOLN: 20.0000 meq | Freq: Once | INTRAVENOUS | Status: DC

## 2021-12-07 MED ORDER — POTASSIUM CHLORIDE IN NACL 20-0.9 MEQ/L-% IV SOLN
Freq: Once | INTRAVENOUS | Status: AC
  Filled 2021-12-07: qty 1000

## 2021-12-07 MED ORDER — SODIUM CHLORIDE 0.9% FLUSH
10.0000 mL | Freq: Once | INTRAVENOUS | Status: AC
  Administered 2021-12-07: 10 mL

## 2021-12-07 MED ORDER — HEPARIN SOD (PORK) LOCK FLUSH 100 UNIT/ML IV SOLN
500.0000 [IU] | Freq: Once | INTRAVENOUS | Status: AC | PRN
  Administered 2021-12-07: 500 [IU]

## 2021-12-07 MED ORDER — POTASSIUM CHLORIDE CRYS ER 20 MEQ PO TBCR: 20.0000 meq | EXTENDED_RELEASE_TABLET | Freq: Every day | ORAL | 0 refills | Status: DC

## 2021-12-07 NOTE — Assessment & Plan Note (Addendum)
05/03/2020:Screening mammogram showed a 1.0cm mass at the 6 o'clock position in the right breast. Diagnostic mammogram and US showed the 1.2cm mass at the 6 o'clock position in the right breast. Biopsy showed IDC, grade 3, HER-2 positive (3+), ER 15% weak, PR-, Ki67 80%.  06/02/2020:Right lumpectomy ([facility]): invasive and in situ ductal carcinoma, 1.3cm, clear margins, 1 right axillary lymph node negative for carcinoma. ER 15% weak, PR-,HER-2 positive (3+) Ki67 80%.  Treatment plan: 1.Adjuvant chemotherapy with Taxol Herceptinfollowed byHerceptin maintenance.Stopped after 11 cycles of TaxolHerceptin maintenance completed 07/07/2021 2.Adjuvant radiation therapy9/21/2022-12/19/2020 3.Adjuvant antiestrogen therapywith tamoxifen started October 2022 4.CT angiogram 07/13/2021: Right supraclavicular lymph node 5 cm, right internal mammary lymph nodes 1.8 cm 5.Metastatic breast cancer: June 2023:Right supraclavicular lymphadenopathy:Biopsy: Metastatic poorly differentiated adenocarcinoma, ER +40%, PR negative, HER2 positive 3+ PET CT scan6/4/23: Ext Right Supra clav LN along with Internal mammary and Rt Upper Mediastinal and Rt Axillary LN ----------------------------------------------------------------------------------------------------- Current treatment:Enhertustartedon 08/21/2021, (this treatment on hold) Enhertu toxicities: 1,.Severe nausea, requiring IV fluids: Nausea has significantly improved 2.severe diarrhea : Also resolved 3.Severe fatigue: Able to walk without assistance  4.Chemotherapy-induced anemia: Monitoring closely 5.  Hospitalization 11/26/2021-11/28/2021: Dizziness and fatigue, esophageal dysmotility  We believe that it would take at least another 3 weeks for her to recover more.  She will receive IV fluids on a weekly basis.

## 2021-12-08 ENCOUNTER — Inpatient Hospital Stay: Payer: 59 | Admitting: Dietician

## 2021-12-08 ENCOUNTER — Inpatient Hospital Stay: Payer: 59

## 2021-12-08 NOTE — Progress Notes (Signed)
Planned to see patient during IV fluid infusion today. This appointment was cancelled. Called patient for nutrition appointment. Patient reports she has a bad migraine headache this morning. She has requested to reschedule. Will plan to meet with patient Thursday, October 26 in infusion. Patient aware. She is appreciative.

## 2021-12-09 ENCOUNTER — Inpatient Hospital Stay: Payer: 59

## 2021-12-09 VITALS — BP 143/95 | HR 82 | Temp 97.8°F | Resp 16

## 2021-12-09 DIAGNOSIS — Z95828 Presence of other vascular implants and grafts: Secondary | ICD-10-CM

## 2021-12-09 DIAGNOSIS — R110 Nausea: Secondary | ICD-10-CM

## 2021-12-09 DIAGNOSIS — C50511 Malignant neoplasm of lower-outer quadrant of right female breast: Secondary | ICD-10-CM

## 2021-12-09 DIAGNOSIS — Z5112 Encounter for antineoplastic immunotherapy: Secondary | ICD-10-CM | POA: Diagnosis not present

## 2021-12-09 DIAGNOSIS — C50919 Malignant neoplasm of unspecified site of unspecified female breast: Secondary | ICD-10-CM

## 2021-12-09 MED ORDER — SODIUM CHLORIDE 0.9 % IV SOLN: INTRAVENOUS | Status: AC

## 2021-12-09 MED ORDER — SODIUM CHLORIDE 0.9% FLUSH
10.0000 mL | Freq: Once | INTRAVENOUS | Status: AC | PRN
  Administered 2021-12-09: 10 mL

## 2021-12-09 MED ORDER — POTASSIUM CHLORIDE IN NACL 20-0.9 MEQ/L-% IV SOLN
Freq: Once | INTRAVENOUS | Status: AC
  Filled 2021-12-09: qty 1000

## 2021-12-09 MED ORDER — POTASSIUM CHLORIDE 20 MEQ/100ML IV SOLN: 20.0000 meq | Freq: Once | INTRAVENOUS | Status: DC

## 2021-12-09 MED ORDER — HEPARIN SOD (PORK) LOCK FLUSH 100 UNIT/ML IV SOLN
500.0000 [IU] | Freq: Once | INTRAVENOUS | Status: AC | PRN
  Administered 2021-12-09: 500 [IU]

## 2021-12-09 NOTE — Patient Instructions (Signed)
Potassium Chloride Injection What is this medication? POTASSIUM CHLORIDE (poe TASS i um KLOOR ide) prevents and treats low levels of potassium in your body. Potassium plays an important role in maintaining the health of your kidneys, heart, muscles, and nervous system. This medicine may be used for other purposes; ask your health care provider or pharmacist if you have questions. COMMON BRAND NAME(S): PROAMP What should I tell my care team before I take this medication? They need to know if you have any of these conditions: Addison disease Dehydration Diabetes (high blood sugar) Heart disease High levels of potassium in the blood Irregular heartbeat or rhythm Kidney disease Large areas of burned skin An unusual or allergic reaction to potassium, other medications, foods, dyes, or preservatives Pregnant or trying to get pregnant Breast-feeding How should I use this medication? This medication is injected into a vein. It is given in a hospital or clinic setting. Talk to your care team about the use of this medication in children. Special care may be needed. Overdosage: If you think you have taken too much of this medicine contact a poison control center or emergency room at once. NOTE: This medicine is only for you. Do not share this medicine with others. What if I miss a dose? This does not apply. This medication is not for regular use. What may interact with this medication? Do not take this medication with any of the following: Certain diuretics such as spironolactone, triamterene Eplerenone Sodium polystyrene sulfonate This medication may also interact with the following: Certain medications for blood pressure or heart disease like lisinopril, losartan, quinapril, valsartan Medications that lower your chance of fighting infection such as cyclosporine, tacrolimus NSAIDs, medications for pain and inflammation, like ibuprofen or naproxen Other potassium supplements Salt  substitutes This list may not describe all possible interactions. Give your health care provider a list of all the medicines, herbs, non-prescription drugs, or dietary supplements you use. Also tell them if you smoke, drink alcohol, or use illegal drugs. Some items may interact with your medicine. What should I watch for while using this medication? Visit your care team for regular checks on your progress. Tell your care team if your symptoms do not start to get better or if they get worse. You may need blood work while you are taking this medication. Avoid salt substitutes unless you are told otherwise by your care team. What side effects may I notice from receiving this medication? Side effects that you should report to your care team as soon as possible: Allergic reactions--skin rash, itching, hives, swelling of the face, lips, tongue, or throat High potassium level--muscle weakness, fast or irregular heartbeat Side effects that usually do not require medical attention (report to your care team if they continue or are bothersome): Diarrhea Nausea Stomach pain Vomiting This list may not describe all possible side effects. Call your doctor for medical advice about side effects. You may report side effects to FDA at 1-800-FDA-1088. Where should I keep my medication? This medication is given in a hospital or clinic. It will not be stored at home. NOTE: This sheet is a summary. It may not cover all possible information. If you have questions about this medicine, talk to your doctor, pharmacist, or health care provider.  2023 Elsevier/Gold Standard (2020-06-02 00:00:00) Dehydration, Adult Dehydration is a condition in which there is not enough water or other fluids in the body. This happens when a person loses more fluids than he or she takes in. Important organs, such as  the kidneys, brain, and heart, cannot function without a proper amount of fluids. Any loss of fluids from the body can lead to  dehydration. Dehydration can be mild, moderate, or severe. It should be treated right away to prevent it from becoming severe. What are the causes? Dehydration may be caused by: Conditions that cause loss of water or other fluids, such as diarrhea, vomiting, or sweating or urinating a lot. Not drinking enough fluids, especially when you are ill or doing activities that require a lot of energy. Other illnesses and conditions, such as fever or infection. Certain medicines, such as medicines that remove excess fluid from the body (diuretics). Lack of safe drinking water. Not being able to get enough water and food. What increases the risk? The following factors may make you more likely to develop this condition: Having a long-term (chronic) illness that has not been treated properly, such as diabetes, heart disease, or kidney disease. Being 40 years of age or older. Having a disability. Living in a place that is high in altitude, where thinner, drier air causes more fluid loss. Doing exercises that put stress on your body for a long time (endurance sports). What are the signs or symptoms? Symptoms of dehydration depend on how severe it is. Mild or moderate dehydration Thirst. Dry lips or dry mouth. Dizziness or light-headedness, especially when standing up from a seated position. Muscle cramps. Dark urine. Urine may be the color of tea. Less urine or tears produced than usual. Headache. Severe dehydration Changes in skin. Your skin may be cold and clammy, blotchy, or pale. Your skin also may not return to normal after being lightly pinched and released. Little or no tears, urine, or sweat. Changes in vital signs, such as rapid breathing and low blood pressure. Your pulse may be weak or may be faster than 100 beats a minute when you are sitting still. Other changes, such as: Feeling very thirsty. Sunken eyes. Cold hands and feet. Confusion. Being very tired (lethargic) or having  trouble waking from sleep. Short-term weight loss. Loss of consciousness. How is this diagnosed? This condition is diagnosed based on your symptoms and a physical exam. You may have blood and urine tests to help confirm the diagnosis. How is this treated? Treatment for this condition depends on how severe it is. Treatment should be started right away. Do not wait until dehydration becomes severe. Severe dehydration is an emergency and needs to be treated in a hospital. Mild or moderate dehydration can be treated at home. You may be asked to: Drink more fluids. Drink an oral rehydration solution (ORS). This drink helps restore proper amounts of fluids and salts and minerals in the blood (electrolytes). Severe dehydration can be treated: With IV fluids. By correcting abnormal levels of electrolytes. This is often done by giving electrolytes through a tube that is passed through your nose and into your stomach (nasogastric tube, or NG tube). By treating the underlying cause of dehydration. Follow these instructions at home: Oral rehydration solution If told by your health care provider, drink an ORS: Make an ORS by following instructions on the package. Start by drinking small amounts, about  cup (120 mL) every 5-10 minutes. Slowly increase how much you drink until you have taken the amount recommended by your health care provider. Eating and drinking        Drink enough clear fluid to keep your urine pale yellow. If you were told to drink an ORS, finish the ORS first and then  start slowly drinking other clear fluids. Drink fluids such as: Water. Do not drink only water. Doing that can lead to hyponatremia, which is having too little salt (sodium) in the body. Water from ice chips you suck on. Fruit juice that you have added water to (diluted fruit juice). Low-calorie sports drinks. Eat foods that contain a healthy balance of electrolytes, such as bananas, oranges, potatoes, tomatoes,  and spinach. Do not drink alcohol. Avoid the following: Drinks that contain a lot of sugar. These include high-calorie sports drinks, fruit juice that is not diluted, and soda. Caffeine. Foods that are greasy or contain a lot of fat or sugar. General instructions Take over-the-counter and prescription medicines only as told by your health care provider. Do not take sodium tablets. Doing that can lead to having too much sodium in the body (hypernatremia). Return to your normal activities as told by your health care provider. Ask your health care provider what activities are safe for you. Keep all follow-up visits as told by your health care provider. This is important. Contact a health care provider if: You have muscle cramps, pain, or discomfort, such as: Pain in your abdomen and the pain gets worse or stays in one area (localizes). Stiff neck. You have a rash. You are more irritable than usual. You are sleepier or have a harder time waking than usual. You feel weak or dizzy. You feel very thirsty. Get help right away if you have: Any symptoms of severe dehydration. Symptoms of vomiting, such as: You cannot eat or drink without vomiting. Vomiting gets worse or does not go away. Vomit includes blood or green matter (bile). Symptoms that get worse with treatment. A fever. A severe headache. Problems with urination or bowel movements, such as: Diarrhea that gets worse or does not go away. Blood in your stool (feces). This may cause stool to look black and tarry. Not urinating, or urinating only a small amount of very dark urine, within 6-8 hours. Trouble breathing. These symptoms may represent a serious problem that is an emergency. Do not wait to see if the symptoms will go away. Get medical help right away. Call your local emergency services (911 in the [place] Do not drive yourself to the hospital. Summary Dehydration is a condition in which there is not enough water or other  fluids in the body. This happens when a person loses more fluids than he or she takes in. Treatment for this condition depends on how severe it is. Treatment should be started right away. Do not wait until dehydration becomes severe. Drink enough clear fluid to keep your urine pale yellow. If you were told to drink an oral rehydration solution (ORS), finish the ORS first and then start slowly drinking other clear fluids. Take over-the-counter and prescription medicines only as told by your health care provider. Get help right away if you have any symptoms of severe dehydration. This information is not intended to replace advice given to you by your health care provider. Make sure you discuss any questions you have with your health care provider. Document Revised: 06/28/2021 Document Reviewed: 10/02/2018 Elsevier Patient Education  [place].

## 2021-12-11 ENCOUNTER — Telehealth: Payer: Self-pay | Admitting: Hematology and Oncology

## 2021-12-11 NOTE — Telephone Encounter (Signed)
Rescheduled appointment per 10/6 secure chat. Patient is aware of the changes made to her upcoming appointment.

## 2021-12-14 ENCOUNTER — Other Ambulatory Visit: Payer: Self-pay

## 2021-12-14 ENCOUNTER — Inpatient Hospital Stay: Payer: 59

## 2021-12-14 ENCOUNTER — Telehealth: Payer: Self-pay

## 2021-12-14 VITALS — BP 138/66 | HR 60 | Resp 17

## 2021-12-14 DIAGNOSIS — C50919 Malignant neoplasm of unspecified site of unspecified female breast: Secondary | ICD-10-CM

## 2021-12-14 DIAGNOSIS — C50511 Malignant neoplasm of lower-outer quadrant of right female breast: Secondary | ICD-10-CM

## 2021-12-14 DIAGNOSIS — Z95828 Presence of other vascular implants and grafts: Secondary | ICD-10-CM

## 2021-12-14 DIAGNOSIS — Z5112 Encounter for antineoplastic immunotherapy: Secondary | ICD-10-CM | POA: Diagnosis not present

## 2021-12-14 DIAGNOSIS — R110 Nausea: Secondary | ICD-10-CM

## 2021-12-14 LAB — CBC WITH DIFFERENTIAL (CANCER CENTER ONLY)
Abs Immature Granulocytes: 0.02 10*3/uL (ref 0.00–0.07)
Basophils Absolute: 0 10*3/uL (ref 0.0–0.1)
Basophils Relative: 1 %
Eosinophils Absolute: 0.4 10*3/uL (ref 0.0–0.5)
Eosinophils Relative: 9 %
HCT: 30.7 % — ABNORMAL LOW (ref 36.0–46.0)
Hemoglobin: 9.8 g/dL — ABNORMAL LOW (ref 12.0–15.0)
Immature Granulocytes: 1 %
Lymphocytes Relative: 26 %
Lymphs Abs: 1.1 10*3/uL (ref 0.7–4.0)
MCH: 29.4 pg (ref 26.0–34.0)
MCHC: 31.9 g/dL (ref 30.0–36.0)
MCV: 92.2 fL (ref 80.0–100.0)
Monocytes Absolute: 0.7 10*3/uL (ref 0.1–1.0)
Monocytes Relative: 16 %
Neutro Abs: 2 10*3/uL (ref 1.7–7.7)
Neutrophils Relative %: 47 %
Platelet Count: 214 10*3/uL (ref 150–400)
RBC: 3.33 MIL/uL — ABNORMAL LOW (ref 3.87–5.11)
RDW: 17.7 % — ABNORMAL HIGH (ref 11.5–15.5)
WBC Count: 4.1 10*3/uL (ref 4.0–10.5)
nRBC: 0 % (ref 0.0–0.2)

## 2021-12-14 LAB — CMP (CANCER CENTER ONLY)
ALT: 14 U/L (ref 0–44)
AST: 26 U/L (ref 15–41)
Albumin: 3.6 g/dL (ref 3.5–5.0)
Alkaline Phosphatase: 65 U/L (ref 38–126)
Anion gap: 6 (ref 5–15)
BUN: 6 mg/dL (ref 6–20)
CO2: 34 mmol/L — ABNORMAL HIGH (ref 22–32)
Calcium: 8.5 mg/dL — ABNORMAL LOW (ref 8.9–10.3)
Chloride: 104 mmol/L (ref 98–111)
Creatinine: 0.7 mg/dL (ref 0.44–1.00)
GFR, Estimated: >60 mL/min (ref >60)
Glucose, Bld: 107 mg/dL — ABNORMAL HIGH (ref 70–99)
Potassium: 2.7 mmol/L — CL (ref 3.5–5.1)
Sodium: 144 mmol/L (ref 135–145)
Total Bilirubin: 0.8 mg/dL (ref 0.3–1.2)
Total Protein: 6.5 g/dL (ref 6.5–8.1)

## 2021-12-14 MED ORDER — HEPARIN SOD (PORK) LOCK FLUSH 100 UNIT/ML IV SOLN
500.0000 [IU] | Freq: Once | INTRAVENOUS | Status: AC | PRN
  Administered 2021-12-14: 500 [IU]

## 2021-12-14 MED ORDER — POTASSIUM CHLORIDE 10 MEQ/100ML IV SOLN
10.0000 meq | INTRAVENOUS | Status: AC
  Administered 2021-12-14 (×2): 10 meq via INTRAVENOUS
  Filled 2021-12-14 (×2): qty 100

## 2021-12-14 MED ORDER — SODIUM CHLORIDE 0.9% FLUSH
10.0000 mL | Freq: Once | INTRAVENOUS | Status: AC
  Administered 2021-12-14: 10 mL

## 2021-12-14 MED ORDER — POTASSIUM CHLORIDE 20 MEQ/100ML IV SOLN: 20.0000 meq | Freq: Once | INTRAVENOUS | Status: DC

## 2021-12-14 MED ORDER — SODIUM CHLORIDE 0.9 % IV SOLN: INTRAVENOUS | Status: AC

## 2021-12-14 MED ORDER — CYANOCOBALAMIN 1000 MCG/ML IJ SOLN: 1000.0000 ug | Freq: Once | INTRAMUSCULAR | Status: DC

## 2021-12-14 MED ORDER — SODIUM CHLORIDE 0.9 % IV SOLN: Freq: Once | INTRAVENOUS | Status: AC

## 2021-12-14 MED ORDER — SODIUM CHLORIDE 0.9% FLUSH
10.0000 mL | Freq: Once | INTRAVENOUS | Status: AC | PRN
  Administered 2021-12-14: 10 mL

## 2021-12-14 NOTE — Patient Instructions (Signed)
Hypokalemia Hypokalemia means that the amount of potassium in the blood is lower than normal. Potassium is a mineral (electrolyte) that helps regulate the amount of fluid in the body. It also stimulates muscle tightening (contraction) and helps nerves work properly. Normally, most of the body's potassium is inside cells, and only a very small amount is in the blood. Because the amount in the blood is so small, minor changes to potassium levels in the blood can be life-threatening. What are the causes? This condition may be caused by: Antibiotic medicine. Diarrhea or vomiting. Taking too much of a medicine that helps you have a bowel movement (laxative) can cause diarrhea and lead to hypokalemia. Chronic kidney disease (CKD). Medicines that help the body get rid of excess fluid (diuretics). Eating disorders, such as anorexia or bulimia. Low magnesium levels in the body. Sweating a lot. What are the signs or symptoms? Symptoms of this condition include: Weakness. Constipation. Fatigue. Muscle cramps. Mental confusion. Skipped heartbeats or irregular heartbeat (palpitations). Tingling or numbness. How is this diagnosed? This condition is diagnosed with a blood test. How is this treated? This condition may be treated by: Taking potassium supplements. Adjusting the medicines that you take. Eating more foods that contain a lot of potassium. If your potassium level is very low, you may need to get potassium through an IV and be monitored in the hospital. Follow these instructions at home: Eating and drinking  Eat a healthy diet. A healthy diet includes fresh fruits and vegetables, whole grains, healthy fats, and lean proteins. If told, eat more foods that contain a lot of potassium. These include: Nuts, such as peanuts and pistachios. Seeds, such as sunflower seeds and pumpkin seeds. Peas, lentils, and lima beans. Whole grain and bran cereals and breads. Fresh fruits and vegetables,  such as apricots, avocado, bananas, cantaloupe, kiwi, oranges, tomatoes, asparagus, and potatoes. Juices, such as orange, tomato, and prune. Lean meats, including fish. Milk and milk products, such as yogurt. General instructions Take over-the-counter and prescription medicines only as told by your health care provider. This includes vitamins, natural food products, and supplements. Keep all follow-up visits. This is important. Contact a health care provider if: You have weakness that gets worse. You feel your heart pounding or racing. You vomit. You have diarrhea. You have diabetes and you have trouble keeping your blood sugar in your target range. Get help right away if: You have chest pain. You have shortness of breath. You have vomiting or diarrhea that lasts for more than 2 days. You faint. These symptoms may be an emergency. Get help right away. Call 911. Do not wait to see if the symptoms will go away. Do not drive yourself to the hospital. Summary Hypokalemia means that the amount of potassium in the blood is lower than normal. This condition is diagnosed with a blood test. Hypokalemia may be treated by taking potassium supplements, adjusting the medicines that you take, or eating more foods that are high in potassium. If your potassium level is very low, you may need to get potassium through an IV and be monitored in the hospital. This information is not intended to replace advice given to you by your health care provider. Make sure you discuss any questions you have with your health care provider. Document Revised: 11/03/2020 Document Reviewed: 11/03/2020 Elsevier Patient Education  2023 Else[employer] Inc.  

## 2021-12-14 NOTE — Telephone Encounter (Signed)
CRITICAL VALUE STICKER  CRITICAL VALUE: Potassium RECEIVER (on-site recipient of call): Leander NOTIFIED: 10/12/202309:27  MESSENGER (representative from lab): Lauren  MD NOTIFIED: Dr.Gudena  TIME OF NOTIFICATION: 12/14/2021 9:29  RESPONSE:  Patient is in the office will be seeing by the provider

## 2021-12-18 ENCOUNTER — Other Ambulatory Visit: Payer: Self-pay | Admitting: *Deleted

## 2021-12-18 DIAGNOSIS — E876 Hypokalemia: Secondary | ICD-10-CM

## 2021-12-18 DIAGNOSIS — C50919 Malignant neoplasm of unspecified site of unspecified female breast: Secondary | ICD-10-CM

## 2021-12-19 ENCOUNTER — Encounter: Payer: Self-pay | Admitting: Hematology and Oncology

## 2021-12-19 NOTE — Progress Notes (Signed)
Pt called with financial concerns and needs assistance with her rent.  She stated she's currently not receiving income due to her Dx and would like to apply for the [employer].  She has provided a letter of support so she is approved for the $1000 [employer].

## 2021-12-20 ENCOUNTER — Other Ambulatory Visit: Payer: Self-pay

## 2021-12-20 ENCOUNTER — Inpatient Hospital Stay: Payer: 59

## 2021-12-20 ENCOUNTER — Telehealth: Payer: Self-pay

## 2021-12-20 DIAGNOSIS — R110 Nausea: Secondary | ICD-10-CM

## 2021-12-20 DIAGNOSIS — C50919 Malignant neoplasm of unspecified site of unspecified female breast: Secondary | ICD-10-CM

## 2021-12-20 DIAGNOSIS — E876 Hypokalemia: Secondary | ICD-10-CM

## 2021-12-20 DIAGNOSIS — Z170 Estrogen receptor positive status [ER+]: Secondary | ICD-10-CM

## 2021-12-20 DIAGNOSIS — Z5112 Encounter for antineoplastic immunotherapy: Secondary | ICD-10-CM | POA: Diagnosis not present

## 2021-12-20 DIAGNOSIS — Z95828 Presence of other vascular implants and grafts: Secondary | ICD-10-CM

## 2021-12-20 LAB — CMP (CANCER CENTER ONLY)
ALT: 14 U/L (ref 0–44)
AST: 24 U/L (ref 15–41)
Albumin: 3.6 g/dL (ref 3.5–5.0)
Alkaline Phosphatase: 66 U/L (ref 38–126)
Anion gap: 5 (ref 5–15)
BUN: 5 mg/dL — ABNORMAL LOW (ref 6–20)
CO2: 33 mmol/L — ABNORMAL HIGH (ref 22–32)
Calcium: 9 mg/dL (ref 8.9–10.3)
Chloride: 106 mmol/L (ref 98–111)
Creatinine: 0.68 mg/dL (ref 0.44–1.00)
GFR, Estimated: >60 mL/min (ref >60)
Glucose, Bld: 103 mg/dL — ABNORMAL HIGH (ref 70–99)
Potassium: 3.1 mmol/L — ABNORMAL LOW (ref 3.5–5.1)
Sodium: 144 mmol/L (ref 135–145)
Total Bilirubin: 0.7 mg/dL (ref 0.3–1.2)
Total Protein: 6.4 g/dL — ABNORMAL LOW (ref 6.5–8.1)

## 2021-12-20 LAB — CBC WITH DIFFERENTIAL (CANCER CENTER ONLY)
Abs Immature Granulocytes: 0.02 10*3/uL (ref 0.00–0.07)
Basophils Absolute: 0 10*3/uL (ref 0.0–0.1)
Basophils Relative: 1 %
Eosinophils Absolute: 0.3 10*3/uL (ref 0.0–0.5)
Eosinophils Relative: 8 %
HCT: 32.4 % — ABNORMAL LOW (ref 36.0–46.0)
Hemoglobin: 10.2 g/dL — ABNORMAL LOW (ref 12.0–15.0)
Immature Granulocytes: 1 %
Lymphocytes Relative: 26 %
Lymphs Abs: 1 10*3/uL (ref 0.7–4.0)
MCH: 29.1 pg (ref 26.0–34.0)
MCHC: 31.5 g/dL (ref 30.0–36.0)
MCV: 92.6 fL (ref 80.0–100.0)
Monocytes Absolute: 0.5 10*3/uL (ref 0.1–1.0)
Monocytes Relative: 13 %
Neutro Abs: 1.9 10*3/uL (ref 1.7–7.7)
Neutrophils Relative %: 51 %
Platelet Count: 242 10*3/uL (ref 150–400)
RBC: 3.5 MIL/uL — ABNORMAL LOW (ref 3.87–5.11)
RDW: 16.8 % — ABNORMAL HIGH (ref 11.5–15.5)
WBC Count: 3.7 10*3/uL — ABNORMAL LOW (ref 4.0–10.5)
nRBC: 0 % (ref 0.0–0.2)

## 2021-12-20 MED ORDER — HEPARIN SOD (PORK) LOCK FLUSH 100 UNIT/ML IV SOLN
500.0000 [IU] | Freq: Once | INTRAVENOUS | Status: AC
  Administered 2021-12-20: 500 [IU]

## 2021-12-20 MED ORDER — SODIUM CHLORIDE 0.9% FLUSH
10.0000 mL | Freq: Once | INTRAVENOUS | Status: AC
  Administered 2021-12-20: 10 mL

## 2021-12-20 NOTE — Telephone Encounter (Signed)
Pt called to review K+ results. She asks if she will have IV K+ 12/23/21 when she comes in for IVF. Advised pt K+ is ordered,

## 2021-12-23 ENCOUNTER — Inpatient Hospital Stay: Payer: 59

## 2021-12-23 VITALS — BP 175/74 | HR 62 | Temp 97.9°F | Resp 18

## 2021-12-23 DIAGNOSIS — Z95828 Presence of other vascular implants and grafts: Secondary | ICD-10-CM

## 2021-12-23 DIAGNOSIS — C50919 Malignant neoplasm of unspecified site of unspecified female breast: Secondary | ICD-10-CM

## 2021-12-23 DIAGNOSIS — Z170 Estrogen receptor positive status [ER+]: Secondary | ICD-10-CM

## 2021-12-23 DIAGNOSIS — Z5112 Encounter for antineoplastic immunotherapy: Secondary | ICD-10-CM | POA: Diagnosis not present

## 2021-12-23 DIAGNOSIS — R110 Nausea: Secondary | ICD-10-CM

## 2021-12-23 MED ORDER — SODIUM CHLORIDE 0.9 % IV SOLN: INTRAVENOUS | Status: AC

## 2021-12-23 MED ORDER — HEPARIN SOD (PORK) LOCK FLUSH 100 UNIT/ML IV SOLN
500.0000 [IU] | Freq: Once | INTRAVENOUS | Status: AC
  Administered 2021-12-23: 500 [IU] via INTRAVENOUS

## 2021-12-23 MED ORDER — SODIUM CHLORIDE 0.9% FLUSH
10.0000 mL | INTRAVENOUS | Status: DC | PRN
  Administered 2021-12-23: 10 mL via INTRAVENOUS

## 2021-12-23 MED ORDER — POTASSIUM CHLORIDE 10 MEQ/100ML IV SOLN
10.0000 meq | INTRAVENOUS | Status: AC
  Administered 2021-12-23 (×2): 10 meq via INTRAVENOUS
  Filled 2021-12-23 (×2): qty 100

## 2021-12-23 NOTE — Patient Instructions (Addendum)
Dehydration, Adult Dehydration is a condition in which there is not enough water or other fluids in the body. This happens when a person loses more fluids than he or she takes in. Important organs, such as the kidneys, brain, and heart, cannot function without a proper amount of fluids. Any loss of fluids from the body can lead to dehydration. Dehydration can be mild, moderate, or severe. It should be treated right away to prevent it from becoming severe. What are the causes? Dehydration may be caused by: Conditions that cause loss of water or other fluids, such as diarrhea, vomiting, or sweating or urinating a lot. Not drinking enough fluids, especially when you are ill or doing activities that require a lot of energy. Other illnesses and conditions, such as fever or infection. Certain medicines, such as medicines that remove excess fluid from the body (diuretics). Lack of safe drinking water. Not being able to get enough water and food. What increases the risk? The following factors may make you more likely to develop this condition: Having a long-term (chronic) illness that has not been treated properly, such as diabetes, heart disease, or kidney disease. Being 65 years of age or older. Having a disability. Living in a place that is high in altitude, where thinner, drier air causes more fluid loss. Doing exercises that put stress on your body for a long time (endurance sports). What are the signs or symptoms? Symptoms of dehydration depend on how severe it is. Mild or moderate dehydration Thirst. Dry lips or dry mouth. Dizziness or light-headedness, especially when standing up from a seated position. Muscle cramps. Dark urine. Urine may be the color of tea. Less urine or tears produced than usual. Headache. Severe dehydration Changes in skin. Your skin may be cold and clammy, blotchy, or pale. Your skin also may not return to normal after being lightly pinched and released. Little or  no tears, urine, or sweat. Changes in vital signs, such as rapid breathing and low blood pressure. Your pulse may be weak or may be faster than 100 beats a minute when you are sitting still. Other changes, such as: Feeling very thirsty. Sunken eyes. Cold hands and feet. Confusion. Being very tired (lethargic) or having trouble waking from sleep. Short-term weight loss. Loss of consciousness. How is this diagnosed? This condition is diagnosed based on your symptoms and a physical exam. You may have blood and urine tests to help confirm the diagnosis. How is this treated? Treatment for this condition depends on how severe it is. Treatment should be started right away. Do not wait until dehydration becomes severe. Severe dehydration is an emergency and needs to be treated in a hospital. Mild or moderate dehydration can be treated at home. You may be asked to: Drink more fluids. Drink an oral rehydration solution (ORS). This drink helps restore proper amounts of fluids and salts and minerals in the blood (electrolytes). Severe dehydration can be treated: With IV fluids. By correcting abnormal levels of electrolytes. This is often done by giving electrolytes through a tube that is passed through your nose and into your stomach (nasogastric tube, or NG tube). By treating the underlying cause of dehydration. Follow these instructions at home: Oral rehydration solution If told by your health care provider, drink an ORS: Make an ORS by following instructions on the package. Start by drinking small amounts, about  cup (120 mL) every 5-10 minutes. Slowly increase how much you drink until you have taken the amount recommended by your health   care provider. Eating and drinking        Drink enough clear fluid to keep your urine pale yellow. If you were told to drink an ORS, finish the ORS first and then start slowly drinking other clear fluids. Drink fluids such as: Water. Do not drink only  water. Doing that can lead to hyponatremia, which is having too little salt (sodium) in the body. Water from ice chips you suck on. Fruit juice that you have added water to (diluted fruit juice). Low-calorie sports drinks. Eat foods that contain a healthy balance of electrolytes, such as bananas, oranges, potatoes, tomatoes, and spinach. Do not drink alcohol. Avoid the following: Drinks that contain a lot of sugar. These include high-calorie sports drinks, fruit juice that is not diluted, and soda. Caffeine. Foods that are greasy or contain a lot of fat or sugar. General instructions Take over-the-counter and prescription medicines only as told by your health care provider. Do not take sodium tablets. Doing that can lead to having too much sodium in the body (hypernatremia). Return to your normal activities as told by your health care provider. Ask your health care provider what activities are safe for you. Keep all follow-up visits as told by your health care provider. This is important. Contact a health care provider if: You have muscle cramps, pain, or discomfort, such as: Pain in your abdomen and the pain gets worse or stays in one area (localizes). Stiff neck. You have a rash. You are more irritable than usual. You are sleepier or have a harder time waking than usual. You feel weak or dizzy. You feel very thirsty. Get help right away if you have: Any symptoms of severe dehydration. Symptoms of vomiting, such as: You cannot eat or drink without vomiting. Vomiting gets worse or does not go away. Vomit includes blood or green matter (bile). Symptoms that get worse with treatment. A fever. A severe headache. Problems with urination or bowel movements, such as: Diarrhea that gets worse or does not go away. Blood in your stool (feces). This may cause stool to look black and tarry. Not urinating, or urinating only a small amount of very dark urine, within 6-8 hours. Trouble  breathing. These symptoms may represent a serious problem that is an emergency. Do not wait to see if the symptoms will go away. Get medical help right away. Call your local emergency services (911 in the [place] Do not drive yourself to the hospital. Summary Dehydration is a condition in which there is not enough water or other fluids in the body. This happens when a person loses more fluids than he or she takes in. Treatment for this condition depends on how severe it is. Treatment should be started right away. Do not wait until dehydration becomes severe. Drink enough clear fluid to keep your urine pale yellow. If you were told to drink an oral rehydration solution (ORS), finish the ORS first and then start slowly drinking other clear fluids. Take over-the-counter and prescription medicines only as told by your health care provider. Get help right away if you have any symptoms of severe dehydration. This information is not intended to replace advice given to you by your health care provider. Make sure you discuss any questions you have with your health care provider.  Potassium Chloride Injection What is this medication? POTASSIUM CHLORIDE (poe TASS i um KLOOR ide) prevents and treats low levels of potassium in your body. Potassium plays an important role in maintaining the health of your  kidneys, heart, muscles, and nervous system. This medicine may be used for other purposes; ask your health care provider or pharmacist if you have questions. COMMON BRAND NAME(S): PROAMP What should I tell my care team before I take this medication? They need to know if you have any of these conditions: Addison disease Dehydration Diabetes (high blood sugar) Heart disease High levels of potassium in the blood Irregular heartbeat or rhythm Kidney disease Large areas of burned skin An unusual or allergic reaction to potassium, other medications, foods, dyes, or preservatives Pregnant or trying to get  pregnant Breast-feeding How should I use this medication? This medication is injected into a vein. It is given in a hospital or clinic setting. Talk to your care team about the use of this medication in children. Special care may be needed. Overdosage: If you think you have taken too much of this medicine contact a poison control center or emergency room at once. NOTE: This medicine is only for you. Do not share this medicine with others. What if I miss a dose? This does not apply. This medication is not for regular use. What may interact with this medication? Do not take this medication with any of the following: Certain diuretics such as spironolactone, triamterene Eplerenone Sodium polystyrene sulfonate This medication may also interact with the following: Certain medications for blood pressure or heart disease like lisinopril, losartan, quinapril, valsartan Medications that lower your chance of fighting infection such as cyclosporine, tacrolimus NSAIDs, medications for pain and inflammation, like ibuprofen or naproxen Other potassium supplements Salt substitutes This list may not describe all possible interactions. Give your health care provider a list of all the medicines, herbs, non-prescription drugs, or dietary supplements you use. Also tell them if you smoke, drink alcohol, or use illegal drugs. Some items may interact with your medicine. What should I watch for while using this medication? Visit your care team for regular checks on your progress. Tell your care team if your symptoms do not start to get better or if they get worse. You may need blood work while you are taking this medication. Avoid salt substitutes unless you are told otherwise by your care team. What side effects may I notice from receiving this medication? Side effects that you should report to your care team as soon as possible: Allergic reactions--skin rash, itching, hives, swelling of the face, lips, tongue,  or throat High potassium level--muscle weakness, fast or irregular heartbeat Side effects that usually do not require medical attention (report to your care team if they continue or are bothersome): Diarrhea Nausea Stomach pain Vomiting This list may not describe all possible side effects. Call your doctor for medical advice about side effects. You may report side effects to FDA at 1-800-FDA-1088. Where should I keep my medication? This medication is given in a hospital or clinic. It will not be stored at home. NOTE: This sheet is a summary. It may not cover all possible information. If you have questions about this medicine, talk to your doctor, pharmacist, or health care provider.  2023 Elsevier/Gold Standard (2020-06-02 00:00:00)  Document Revised: 06/28/2021 Document Reviewed: 10/02/2018 Elsevier Patient Education  [place].

## 2021-12-25 ENCOUNTER — Telehealth: Payer: Self-pay

## 2021-12-25 NOTE — Telephone Encounter (Signed)
Notified Patient of completion of FMLA forms. Fax transmission confirmation received. Copy of forms emailed to Patient as requested.

## 2021-12-27 MED FILL — Fosaprepitant Dimeglumine For IV Infusion 150 MG (Base Eq): INTRAVENOUS | Qty: 5 | Status: AC

## 2021-12-27 NOTE — Progress Notes (Signed)
Patient Care Team: [name], MD as PCP - General (Internal Medicine) Mcarthur Rossetti, MD as Consulting Physician (Orthopedic Surgery) Mauro Kaufmann, RN as Oncology Nurse Navigator Rockwell Germany, RN as Oncology Nurse Navigator [name], MD as Consulting Physician (General Surgery) Nicholas Lose, MD as Consulting Physician (Hematology and Oncology) Eppie Gibson, MD as Attending Physician (Radiation Oncology)  DIAGNOSIS: No diagnosis found.  SUMMARY OF ONCOLOGIC HISTORY: Oncology History  Malignant neoplasm of lower-outer quadrant of right breast of female, estrogen receptor positive ([place])  05/03/2020 Initial Diagnosis   Screening mammogram showed a 1.0cm mass at the 6 o'clock position in the right breast. Diagnostic mammogram and US showed the 1.2cm mass at the 6 o'clock position in the right breast. Biopsy showed IDC, grade 3, HER-2 positive (3+), ER 15% weak, PR-, Ki67 80%.   05/11/2020 Cancer Staging   Staging form: Breast, AJCC 8th Edition - Clinical stage from 05/11/2020: Stage IA (cT1b, cN0, cM0, G3, ER+, PR-, HER2+) - Signed by Nicholas Lose, MD on 05/11/2020 Stage prefix: Initial diagnosis   05/19/2020 Genetic Testing   Negative hereditary cancer genetic testing: no pathogenic variants detected in [facility] Breast STAT Panel and Common Hereditary Cancers Panel.  The report dates are May 19, 2020 (STAT) and May 23, 2020 (Common Hereditary).   The Common Hereditary Cancers Panel offered by [facility] includes sequencing and/or deletion duplication testing of the following 47 genes: APC, ATM, AXIN2, BARD1, BMPR1A, BRCA1, BRCA2, BRIP1, CDH1, CDK4, CDKN2A (p14ARF), CDKN2A (p16INK4a), CHEK2, CTNNA1, DICER1, EPCAM (Deletion/duplication testing only), GREM1 (promoter region deletion/duplication testing only), GREM1, HOXB13, KIT, MEN1, MLH1, MSH2, MSH3, MSH6, MUTYH, NBN, NF1, NHTL1, PALB2, PDGFRA, PMS2, POLD1, POLE, PTEN, RAD50, RAD51C, RAD51D, SDHA, SDHB, SDHC, SDHD,  SMAD4, SMARCA4. STK11, TP53, TSC1, TSC2, and VHL.  The following genes were evaluated for sequence changes only: SDHA and HOXB13 c.251G>A variant only.es only: SDHA and HOXB13 c.251G>A variant only.   06/02/2020 Surgery   Right lumpectomy ([facility]): invasive and in situ ductal carcinoma, 1.3cm, clear margins, 1 right axillary lymph node negative for carcinoma.   06/02/2020 Cancer Staging   Staging form: Breast, AJCC 8th Edition - Pathologic stage from 06/02/2020: Stage IA (pT1c, pN0, cM0, G3, ER+, PR-, HER2+) - Signed by Gardenia Phlegm, NP on 06/15/2020 Stage prefix: Initial diagnosis Histologic grading system: 3 grade system   07/08/2020 - 10/21/2020 Chemotherapy   Taxol Herceptin followed by Herceptin maintenance   11/11/2020 -  Chemotherapy   Herceptin maintenance       04/14/2021 -  Anti-estrogen oral therapy   Tamoxifen 10 mg   08/21/2021 - 10/26/2021 Chemotherapy   Patient is on Treatment Plan : BREAST METASTATIC fam-trastuzumab deruxtecan-nxki (Enhertu) q21d     11/15/2021 -  Chemotherapy   Patient is on Treatment Plan : BREAST METASTATIC Fam-Trastuzumab Deruxtecan-nxki (Enhertu) (5.4) q21d       CHIEF COMPLIANT: Follow-up metastatic breast cancer  INTERVAL HISTORY: Pamela [name] is a 56 y.o. with the above-mentioned metastatic breast cancer. She presents to the clinic for a follow-up and iv fluids.   ALLERGIES:  is allergic to pollen extract, compazine [prochlorperazine], latex, codeine, shellfish allergy, and betadine [povidone iodine].  MEDICATIONS:  Current Outpatient Medications  Medication Sig Dispense Refill   amLODipine (NORVASC) 10 MG tablet Take 1 tablet (10 mg total) by mouth daily. 90 tablet 2   aspirin EC 81 MG tablet Take 1 tablet (81 mg total) by mouth daily. Swallow whole. 30 tablet 11   Azilsartan-Chlorthalidone (EDARBYCLOR) 40-25 MG TABS Take  1 tablet by mouth daily. 90 tablet 2   dexamethasone (DECADRON) 4 MG tablet Take 1 tablet (4 mg total) by  mouth 2 (two) times daily with a meal. Day 2,3,4 one tablet with lunch 30 tablet 1   diphenoxylate-atropine (LOMOTIL) 2.5-0.025 MG tablet Take 1 tablet by mouth 4 (four) times daily as needed for diarrhea or loose stools. 30 tablet 0   levonorgestrel (MIRENA, 52 MG,) 20 MCG/DAY IUD 1 each by Intrauterine route once.     lidocaine-prilocaine (EMLA) cream Apply 1 application topically daily as needed. (Patient taking differently: Apply 1 application  topically daily as needed (port access).) 60 g 0   LORazepam (ATIVAN) 0.5 MG tablet Take 1 tablet (0.5 mg total) by mouth every 8 (eight) hours as needed for anxiety. 30 tablet 0   montelukast (SINGULAIR) 10 MG tablet Take 1 tablet (10 mg total) by mouth daily. (Patient taking differently: Take 10 mg by mouth daily as needed (allergies).) 90 tablet 0   oxyCODONE (OXY IR/ROXICODONE) 5 MG immediate release tablet Take 1 tablet (5 mg total) by mouth every 6 (six) hours as needed for severe pain. 15 tablet 0   pantoprazole (PROTONIX) 40 MG tablet Take 1 tablet (40 mg total) by mouth daily. 30 tablet 1   potassium chloride SA (KLOR-CON M) 20 MEQ tablet Take 1 tablet (20 mEq total) by mouth daily. 30 tablet 0   prochlorperazine (COMPAZINE) 10 MG tablet Take 10 mg by mouth every 6 (six) hours as needed for nausea or vomiting.     rosuvastatin (CRESTOR) 40 MG tablet Take 1 tablet (40 mg total) by mouth daily. 90 tablet 3   No current facility-administered medications for this visit.   Facility-Administered Medications Ordered in Other Visits  Medication Dose Route Frequency Provider Last Rate Last Admin   heparin lock flush 100 unit/mL  500 Units Intracatheter Once Nicholas Lose, MD       sodium chloride flush (NS) 0.9 % injection 10 mL  10 mL Intracatheter Once Nicholas Lose, MD        PHYSICAL EXAMINATION: ECOG PERFORMANCE STATUS: {CHL ONC ECOG IR:[id number]}  There were no vitals filed for this visit. There were no vitals filed for this  visit.  BREAST:*** No palpable masses or nodules in either right or left breasts. No palpable axillary supraclavicular or infraclavicular adenopathy no breast tenderness or nipple discharge. (exam performed in the presence of a chaperone)  LABORATORY DATA:  I have reviewed the data as listed    Latest Ref Rng & Units 12/20/2021    9:45 AM 12/14/2021    8:22 AM 12/07/2021    8:23 AM  CMP  Glucose 70 - 99 mg/dL 103  107  115   BUN 6 - 20 mg/dL 5  6  9    Creatinine 0.44 - 1.00 mg/dL 0.68  0.70  0.85   Sodium 135 - 145 mmol/L 144  144  143   Potassium 3.5 - 5.1 mmol/L 3.1  2.7  3.0   Chloride 98 - 111 mmol/L 106  104  105   CO2 22 - 32 mmol/L 33  34  31   Calcium 8.9 - 10.3 mg/dL 9.0  8.5  8.7   Total Protein 6.5 - 8.1 g/dL 6.4  6.5  6.8   Total Bilirubin 0.3 - 1.2 mg/dL 0.7  0.8  0.9   Alkaline Phos 38 - 126 U/L 66  65  71   AST 15 - 41 U/L 24  26  22  ALT 0 - 44 U/L 14  14  13      Lab Results  Component Value Date   WBC 3.7 (L) 12/20/2021   HGB 10.2 (L) 12/20/2021   HCT 32.4 (L) 12/20/2021   MCV 92.6 12/20/2021   PLT 242 12/20/2021   NEUTROABS 1.9 12/20/2021    ASSESSMENT & PLAN:  No problem-specific Assessment & Plan notes found for this encounter.    No orders of the defined types were placed in this encounter.  The patient has a good understanding of the overall plan. she agrees with it. she will call with any problems that may develop before the next visit here. Total time spent: 30 mins including face to face time and time spent for planning, charting and co-ordination of care   Suzzette Righter, [place] 12/27/21    I Gardiner Coins am scribing for Dr. Lindi Adie  ***

## 2021-12-28 ENCOUNTER — Other Ambulatory Visit: Payer: Self-pay | Admitting: *Deleted

## 2021-12-28 ENCOUNTER — Inpatient Hospital Stay: Payer: 59

## 2021-12-28 ENCOUNTER — Telehealth: Payer: Self-pay | Admitting: Hematology and Oncology

## 2021-12-28 ENCOUNTER — Other Ambulatory Visit: Payer: Self-pay | Admitting: Hematology and Oncology

## 2021-12-28 ENCOUNTER — Inpatient Hospital Stay: Payer: 59 | Admitting: Dietician

## 2021-12-28 ENCOUNTER — Other Ambulatory Visit: Payer: Self-pay

## 2021-12-28 ENCOUNTER — Inpatient Hospital Stay (HOSPITAL_BASED_OUTPATIENT_CLINIC_OR_DEPARTMENT_OTHER): Payer: 59 | Admitting: Hematology and Oncology

## 2021-12-28 VITALS — HR 87

## 2021-12-28 DIAGNOSIS — Z170 Estrogen receptor positive status [ER+]: Secondary | ICD-10-CM

## 2021-12-28 DIAGNOSIS — E876 Hypokalemia: Secondary | ICD-10-CM

## 2021-12-28 DIAGNOSIS — R110 Nausea: Secondary | ICD-10-CM

## 2021-12-28 DIAGNOSIS — C50511 Malignant neoplasm of lower-outer quadrant of right female breast: Secondary | ICD-10-CM

## 2021-12-28 DIAGNOSIS — C50919 Malignant neoplasm of unspecified site of unspecified female breast: Secondary | ICD-10-CM

## 2021-12-28 DIAGNOSIS — Z5112 Encounter for antineoplastic immunotherapy: Secondary | ICD-10-CM | POA: Diagnosis not present

## 2021-12-28 DIAGNOSIS — Z95828 Presence of other vascular implants and grafts: Secondary | ICD-10-CM

## 2021-12-28 LAB — CBC WITH DIFFERENTIAL (CANCER CENTER ONLY)
Abs Immature Granulocytes: 0.02 10*3/uL (ref 0.00–0.07)
Basophils Absolute: 0.1 10*3/uL (ref 0.0–0.1)
Basophils Relative: 1 %
Eosinophils Absolute: 0.6 10*3/uL — ABNORMAL HIGH (ref 0.0–0.5)
Eosinophils Relative: 9 %
HCT: 35.7 % — ABNORMAL LOW (ref 36.0–46.0)
Hemoglobin: 11.4 g/dL — ABNORMAL LOW (ref 12.0–15.0)
Immature Granulocytes: 0 %
Lymphocytes Relative: 23 %
Lymphs Abs: 1.4 10*3/uL (ref 0.7–4.0)
MCH: 29.3 pg (ref 26.0–34.0)
MCHC: 31.9 g/dL (ref 30.0–36.0)
MCV: 91.8 fL (ref 80.0–100.0)
Monocytes Absolute: 1 10*3/uL (ref 0.1–1.0)
Monocytes Relative: 16 %
Neutro Abs: 3.3 10*3/uL (ref 1.7–7.7)
Neutrophils Relative %: 51 %
Platelet Count: 292 10*3/uL (ref 150–400)
RBC: 3.89 MIL/uL (ref 3.87–5.11)
RDW: 15 % (ref 11.5–15.5)
WBC Count: 6.4 10*3/uL (ref 4.0–10.5)
nRBC: 0 % (ref 0.0–0.2)

## 2021-12-28 LAB — CMP (CANCER CENTER ONLY)
ALT: 13 U/L (ref 0–44)
AST: 24 U/L (ref 15–41)
Albumin: 3.9 g/dL (ref 3.5–5.0)
Alkaline Phosphatase: 67 U/L (ref 38–126)
Anion gap: 8 (ref 5–15)
BUN: 8 mg/dL (ref 6–20)
CO2: 32 mmol/L (ref 22–32)
Calcium: 9.3 mg/dL (ref 8.9–10.3)
Chloride: 103 mmol/L (ref 98–111)
Creatinine: 0.74 mg/dL (ref 0.44–1.00)
GFR, Estimated: >60 mL/min (ref >60)
Glucose, Bld: 111 mg/dL — ABNORMAL HIGH (ref 70–99)
Potassium: 3 mmol/L — ABNORMAL LOW (ref 3.5–5.1)
Sodium: 143 mmol/L (ref 135–145)
Total Bilirubin: 0.8 mg/dL (ref 0.3–1.2)
Total Protein: 6.9 g/dL (ref 6.5–8.1)

## 2021-12-28 MED ORDER — SODIUM CHLORIDE 0.9 % IV SOLN: INTRAVENOUS | Status: DC

## 2021-12-28 MED ORDER — POTASSIUM CHLORIDE 10 MEQ/100ML IV SOLN
10.0000 meq | Freq: Once | INTRAVENOUS | Status: AC
  Administered 2021-12-28: 10 meq via INTRAVENOUS
  Filled 2021-12-28: qty 100

## 2021-12-28 MED ORDER — AMOXICILLIN 500 MG PO TABS: 500.0000 mg | ORAL_TABLET | Freq: Two times a day (BID) | ORAL | 0 refills | Status: DC

## 2021-12-28 MED ORDER — HEPARIN SOD (PORK) LOCK FLUSH 100 UNIT/ML IV SOLN
500.0000 [IU] | Freq: Once | INTRAVENOUS | Status: AC | PRN
  Administered 2021-12-28: 500 [IU]

## 2021-12-28 MED ORDER — FAM-TRASTUZUMAB DERUXTECAN-NXKI CHEMO 100 MG IV SOLR
2.9200 mg/kg | Freq: Once | INTRAVENOUS | Status: AC
  Administered 2021-12-28: 400 mg via INTRAVENOUS
  Filled 2021-12-28: qty 20

## 2021-12-28 MED ORDER — SODIUM CHLORIDE 0.9 % IV SOLN
150.0000 mg | Freq: Once | INTRAVENOUS | Status: DC
  Filled 2021-12-28 (×2): qty 5

## 2021-12-28 MED ORDER — SODIUM CHLORIDE 0.9% FLUSH
10.0000 mL | Freq: Once | INTRAVENOUS | Status: AC | PRN
  Administered 2021-12-28: 10 mL

## 2021-12-28 MED ORDER — SODIUM CHLORIDE 0.9% FLUSH
10.0000 mL | INTRAVENOUS | Status: DC | PRN
  Administered 2021-12-28: 10 mL

## 2021-12-28 MED ORDER — DEXTROSE 5 % IV SOLN: Freq: Once | INTRAVENOUS | Status: AC

## 2021-12-28 MED ORDER — CYANOCOBALAMIN 1000 MCG/ML IJ SOLN
1000.0000 ug | Freq: Once | INTRAMUSCULAR | Status: AC
  Administered 2021-12-28: 1000 ug via INTRAMUSCULAR
  Filled 2021-12-28: qty 1

## 2021-12-28 MED ORDER — PALONOSETRON HCL INJECTION 0.25 MG/5ML
0.2500 mg | Freq: Once | INTRAVENOUS | Status: AC
  Administered 2021-12-28: 0.25 mg via INTRAVENOUS
  Filled 2021-12-28: qty 5

## 2021-12-28 MED ORDER — ACETAMINOPHEN 325 MG PO TABS
650.0000 mg | ORAL_TABLET | Freq: Once | ORAL | Status: AC
  Administered 2021-12-28: 650 mg via ORAL
  Filled 2021-12-28: qty 2

## 2021-12-28 MED ORDER — SODIUM CHLORIDE 0.9 % IV SOLN
150.0000 mg | Freq: Once | INTRAVENOUS | Status: AC
  Administered 2021-12-28: 150 mg via INTRAVENOUS
  Filled 2021-12-28: qty 150

## 2021-12-28 NOTE — Telephone Encounter (Signed)
Scheduled appointment while patient was still in infusion, called to leave message, no voicemail setup .

## 2021-12-28 NOTE — Assessment & Plan Note (Signed)
05/03/2020:Screening mammogram showed a 1.0cm mass at the 6 o'clock position in the right breast. Diagnostic mammogram and US showed the 1.2cm mass at the 6 o'clock position in the right breast. Biopsy showed IDC, grade 3, HER-2 positive (3+), ER 15% weak, PR-, Ki67 80%.  06/02/2020:Right lumpectomy ([facility]): invasive and in situ ductal carcinoma, 1.3cm, clear margins, 1 right axillary lymph node negative for carcinoma. ER 15% weak, PR-,HER-2 positive (3+) Ki67 80%.  Treatment plan: 1.Adjuvant chemotherapy with Taxol Herceptinfollowed byHerceptin maintenance.Stopped after 11 cycles of TaxolHerceptin maintenance completed 07/07/2021 2.Adjuvant radiation therapy9/21/2022-12/19/2020 3.Adjuvant antiestrogen therapywith tamoxifen started October 2022 4.CT angiogram 07/13/2021: Right supraclavicular lymph node 5 cm, right internal mammary lymph nodes 1.8 cm 5.Metastatic breast cancer: June 2023:Right supraclavicular lymphadenopathy:Biopsy: Metastatic poorly differentiated adenocarcinoma, ER +40%, PR negative, HER2 positive 3+ PET CT scan6/4/23: Ext Right Supra clav LN along with Internal mammary and Rt Upper Mediastinal and Rt Axillary LN ----------------------------------------------------------------------------------------------------- Current treatment:Enhertustartedon 08/21/2021,(this treatment on hold) today is cycle 6 Enhertu toxicities: 1,.Severe nausea, requiring IV fluids: Nausea has significantly improved 2.severe diarrhea: Also resolved 3.Severe fatigue: Able to walk without assistance  4.Chemotherapy-induced anemia: Monitoring closely 5.Hospitalization 11/26/2021-11/28/2021: Dizziness and fatigue, esophageal dysmotility  Return to clinic every 3 weeks for Enhertu

## 2021-12-28 NOTE — Progress Notes (Signed)
Per MD request RN placed orders for 1 L NS and 10 mEq IV potassium for tx on 12/30/21 and 01/04/22.  Orders were placed under sign and held.

## 2021-12-28 NOTE — Patient Instructions (Addendum)
[facility]  Discharge Instructions: Thank you for choosing [facility] to provide your oncology and hematology care.   If you have a lab appointment with the [facility], please go directly to the [facility] and check in at the registration area.   Wear comfortable clothing and clothing appropriate for easy access to any Portacath or PICC line.   We strive to give you quality time with your provider. You may need to reschedule your appointment if you arrive late (15 or more minutes).  Arriving late affects you and other patients whose appointments are after yours.  Also, if you miss three or more appointments without notifying the office, you may be dismissed from the clinic at the provider's discretion.      For prescription refill requests, have your pharmacy contact our office and allow 72 hours for refills to be completed.    Today you received the following chemotherapy and/or immunotherapy agents: Enhertu.       To help prevent nausea and vomiting after your treatment, we encourage you to take your nausea medication as directed.  BELOW ARE SYMPTOMS THAT SHOULD BE REPORTED IMMEDIATELY: *FEVER GREATER THAN 100.4 F (38 C) OR HIGHER *CHILLS OR SWEATING *NAUSEA AND VOMITING THAT IS NOT CONTROLLED WITH YOUR NAUSEA MEDICATION *UNUSUAL SHORTNESS OF BREATH *UNUSUAL BRUISING OR BLEEDING *URINARY PROBLEMS (pain or burning when urinating, or frequent urination) *BOWEL PROBLEMS (unusual diarrhea, constipation, pain near the anus) TENDERNESS IN MOUTH AND THROAT WITH OR WITHOUT PRESENCE OF ULCERS (sore throat, sores in mouth, or a toothache) UNUSUAL RASH, SWELLING OR PAIN  UNUSUAL VAGINAL DISCHARGE OR ITCHING   Items with * indicate a potential emergency and should be followed up as soon as possible or go to the Emergency Department if any problems should occur.  Please show the CHEMOTHERAPY ALERT CARD or IMMUNOTHERAPY ALERT CARD at check-in to  the Emergency Department and triage nurse.  Should you have questions after your visit or need to cancel or reschedule your appointment, please contact [facility]  Dept: [phone]  and follow the prompts.  Office hours are 8:00 a.m. to 4:30 p.m. Monday - Friday. Please note that voicemails left after 4:00 p.m. may not be returned until the following business day.  We are closed weekends and major holidays. You have access to a nurse at all times for urgent questions. Please call the main number to the clinic Dept: [phone] and follow the prompts.   For any non-urgent questions, you may also contact your provider using MyChart. We now offer e-Visits for anyone 1 and older to request care online for non-urgent symptoms. For details visit [email or web address].   Also download the MyChart app! Go to the app store, search "MyChart", open the app, select [facility], and log in with your MyChart username and password.  Masks are optional in the cancer centers. If you would like for your care team to wear a mask while they are taking care of you, please let them know. You may have one support person who is at least 55 years old accompany you for your appointments. Potassium Chloride Injection What is this medication? POTASSIUM CHLORIDE (poe TASS i um KLOOR ide) prevents and treats low levels of potassium in your body. Potassium plays an important role in maintaining the health of your kidneys, heart, muscles, and nervous system. This medicine may be used for other purposes; ask your health care provider or pharmacist if you have questions.  COMMON BRAND NAME(S): PROAMP What should I tell my care team before I take this medication? They need to know if you have any of these conditions: Addison disease Dehydration Diabetes (high blood sugar) Heart disease High levels of potassium in the blood Irregular heartbeat or rhythm Kidney disease Large areas of burned  skin An unusual or allergic reaction to potassium, other medications, foods, dyes, or preservatives Pregnant or trying to get pregnant Breast-feeding How should I use this medication? This medication is injected into a vein. It is given in a hospital or clinic setting. Talk to your care team about the use of this medication in children. Special care may be needed. Overdosage: If you think you have taken too much of this medicine contact a poison control center or emergency room at once. NOTE: This medicine is only for you. Do not share this medicine with others. What if I miss a dose? This does not apply. This medication is not for regular use. What may interact with this medication? Do not take this medication with any of the following: Certain diuretics such as spironolactone, triamterene Eplerenone Sodium polystyrene sulfonate This medication may also interact with the following: Certain medications for blood pressure or heart disease like lisinopril, losartan, quinapril, valsartan Medications that lower your chance of fighting infection such as cyclosporine, tacrolimus NSAIDs, medications for pain and inflammation, like ibuprofen or naproxen Other potassium supplements Salt substitutes This list may not describe all possible interactions. Give your health care provider a list of all the medicines, herbs, non-prescription drugs, or dietary supplements you use. Also tell them if you smoke, drink alcohol, or use illegal drugs. Some items may interact with your medicine. What should I watch for while using this medication? Visit your care team for regular checks on your progress. Tell your care team if your symptoms do not start to get better or if they get worse. You may need blood work while you are taking this medication. Avoid salt substitutes unless you are told otherwise by your care team. What side effects may I notice from receiving this medication? Side effects that you should  report to your care team as soon as possible: Allergic reactions--skin rash, itching, hives, swelling of the face, lips, tongue, or throat High potassium level--muscle weakness, fast or irregular heartbeat Side effects that usually do not require medical attention (report to your care team if they continue or are bothersome): Diarrhea Nausea Stomach pain Vomiting This list may not describe all possible side effects. Call your doctor for medical advice about side effects. You may report side effects to FDA at 1-800-FDA-1088. Where should I keep my medication? This medication is given in a hospital or clinic. It will not be stored at home. NOTE: This sheet is a summary. It may not cover all possible information. If you have questions about this medicine, talk to your doctor, pharmacist, or health care provider.  2023 Elsevier/Gold Standard (2020-06-02 00:00:00) Electrolytes Test Why am I having this test? Electrolytes are salts and minerals in your body that help keep the amount of water in your body in balance. They also help move nutrients into the body and waste out of the body. Electrolytes also help muscles and nerves function properly. You may have an electrolytes test, also called an electrolytes panel, as part of a routine health screening. You may also have this test if your health care provider thinks that you may have an electrolyte imbalance. What is being tested? An electrolytes test usually checks levels  of: Potassium (K+). Sodium (Na+). Chloride (Cl-). Bicarbonate (HCO3-). What kind of sample is taken?  A blood sample is required for this test. It is usually collected by inserting a needle into a blood vessel. In some cases, a urine sample may also be required. Tell a health care provider about: All medicines you are taking, including vitamins, herbs, eye drops, creams, and over-the-counter medicines. Some medicines can affect electrolyte levels. How are the results  reported? Your test results will be reported as values that indicate your electrolyte levels. Electrolytes are measured in milliequivalents per liter (mEq/L) or millimoles per liter (mmol/L). Your health care provider will compare your results to normal ranges that were established after testing a large group of people (reference ranges). Reference ranges vary among different electrolytes, labs, and hospitals. For this test, common reference ranges for an adult are: Potassium (K+): 3.5-5 mEq/L or 3.5-5 mmol/L. Sodium (Na+): 136-145 mEq/L or 136-145 mmol/L. Chloride (Cl-): 98-106 mEq/L or 98-106 mmol/L. Bicarbonate (HCO3-): 23-30 mEq/L or 23-30 mmol/L. What do the results mean? Results that are within the reference range are considered normal. Electrolyte levels that are above or below the normal range mean that you have an electrolyte imbalance. Your health care provider may do more tests to determine how to correct your imbalance. Talk with your health care provider about what your results mean. Questions to ask your health care provider Ask your health care provider, or the department that is doing the test: When will my results be ready? How will I get my results? What are my treatment options? What other tests do I need? What are my next steps? Summary Electrolytes are minerals in your body that help keep the amount of water in your body in balance. They also help move nutrients into the body and waste out of the body, and help muscles and nerves function properly. Tell your health care provider about all medicines you are taking, including vitamins, herbs, eye drops, creams, and over-the-counter medicines. Some medicines can affect electrolyte levels. Electrolyte levels that are above or below the normal range mean that you have an electrolyte imbalance. Your health care provider may do more tests to determine how to correct your imbalance. This information is not intended to replace advice  given to you by your health care provider. Make sure you discuss any questions you have with your health care provider. Document Revised: 03/14/2021 Document Reviewed: 03/14/2021 Elsevier Patient Education  [place].

## 2021-12-28 NOTE — Progress Notes (Signed)
Nutrition Assessment   Reason for Assessment: +MST   ASSESSMENT: 56 year old female recurrent metastatic breast cancer. Patient is currently receiving Enhertu (reduced dose starting cycle 6).    9/24-9/26 hospitalization   Met with patient in infusion. She reports nausea, diarrhea have resolved. Patient has good appetite and eating well. She has improved energy levels. Patient is planning to resume previous physical activity with walking. Patient is hoping reduced dose will lessen severity of side effects. She is also receiving Emend today and is scheduled for IVF with added potassium x2.    Medications: reviewed    Labs: K 3.0, glucose 111   Anthropometrics: Weight have decreased 7.5% (24 lbs) from usual weight in 9 months; insignificant for time frame - however concerning  Height: 5'9" Weight: 295 lb 3.2 oz  UBW: 319 lb (03/29/21) BMI: 43.59    NUTRITION DIAGNOSIS: Unintentional weight loss related to cancer and associated treatment side effects as evidenced by diarrhea, nausea, 7.5% wt loss in 9 months   INTERVENTION:  Discussed strategies for diarrhea and importance of hydration - handout with tips provided Discussed strategies for nausea - handout with tips + list of foods best tolerated provided Encouraged high calorie high protein shakes as needed with poor po - coupons provided Continue activity as able  Contact information provided     MONITORING, EVALUATION, GOAL: Patient will tolerate adequate calories and protein to minimize weight loss during treatment   Next Visit: To be scheduled as needed with treatment - pt has contact information and encouraged to call with nutrition concerns/questions

## 2021-12-29 ENCOUNTER — Inpatient Hospital Stay: Payer: 59

## 2021-12-30 ENCOUNTER — Inpatient Hospital Stay: Payer: 59

## 2021-12-30 VITALS — BP 111/72 | HR 67 | Temp 98.7°F | Resp 20 | Ht 69.0 in | Wt 295.0 lb

## 2021-12-30 DIAGNOSIS — R110 Nausea: Secondary | ICD-10-CM

## 2021-12-30 DIAGNOSIS — E876 Hypokalemia: Secondary | ICD-10-CM

## 2021-12-30 DIAGNOSIS — Z5112 Encounter for antineoplastic immunotherapy: Secondary | ICD-10-CM | POA: Diagnosis not present

## 2021-12-30 DIAGNOSIS — Z170 Estrogen receptor positive status [ER+]: Secondary | ICD-10-CM

## 2021-12-30 DIAGNOSIS — Z95828 Presence of other vascular implants and grafts: Secondary | ICD-10-CM

## 2021-12-30 DIAGNOSIS — C50919 Malignant neoplasm of unspecified site of unspecified female breast: Secondary | ICD-10-CM

## 2021-12-30 MED ORDER — SODIUM CHLORIDE 0.9% FLUSH
10.0000 mL | Freq: Once | INTRAVENOUS | Status: AC
  Administered 2021-12-30: 10 mL

## 2021-12-30 MED ORDER — HEPARIN SOD (PORK) LOCK FLUSH 100 UNIT/ML IV SOLN
500.0000 [IU] | Freq: Once | INTRAVENOUS | Status: AC
  Administered 2021-12-30: 500 [IU]

## 2021-12-30 MED ORDER — POTASSIUM CHLORIDE 10 MEQ/100ML IV SOLN
10.0000 meq | Freq: Once | INTRAVENOUS | Status: AC
  Administered 2021-12-30: 10 meq via INTRAVENOUS
  Filled 2021-12-30: qty 100

## 2021-12-30 MED ORDER — SODIUM CHLORIDE 0.9 % IV SOLN: INTRAVENOUS | Status: DC

## 2021-12-30 NOTE — Patient Instructions (Addendum)
Rehydration, Adult Rehydration is the replacement of fluids, salts, and minerals in the body (electrolytes) that are lost during dehydration. Dehydration is when there is not enough water or other fluids in the body. This happens when you lose more fluids than you take in. Common causes of dehydration include: Not drinking enough fluids. This can occur when you are ill or doing activities that require a lot of energy, especially in hot weather. Conditions that cause loss of water or other fluids. These include diarrhea, vomiting, sweating, and urinating a lot. Other illnesses, such as fever or infection. Certain medicines, such as those that remove excess fluid from the body (diuretics). Symptoms of mild or moderate dehydration may include thirst, dry lips and mouth, and dizziness. Symptoms of severe dehydration may include increased heart rate, confusion, fainting, and not urinating. In severe cases, you may need to get fluids through an IV at the hospital. For mild or moderate cases, you can usually rehydrate at home by drinking certain fluids as told by your health care provider. What are the risks? Your health care provider will talk with you about risks. Your health care provider will talk with you about risks. This may include taking in too much fluid (overhydration). This is rare. Overhydration can cause an imbalance of electrolytes in the body, kidney failure, or a decrease in salt (sodium) levels in the body. Supplies needed: You will need an oral rehydration solution (ORS) if your health care provider tells you to use one. This is a drink to treat dehydration. It can be found in pharmacies and retail stores. How to rehydrate Fluids Follow instructions from your health care provider about what to drink. The kind of fluid and the amount you should drink depend on your condition. In general, you should choose drinks that you prefer. If told by your health care provider, drink an ORS. Make an  ORS by following instructions on the package. Start by drinking small amounts, about  cup (120 mL) every 5-10 minutes. Slowly increase how much you drink until you have taken in the amount recommended by your health care provider. Drink enough clear fluids to keep your urine pale yellow. If you were told to drink an ORS, finish it first, then start slowly drinking other clear fluids. Drink fluids such as: Water. This includes sparkling and flavored water. Drinking only water can lead to having too little sodium in your body (hyponatremia). Follow the advice of your health care provider. Water from ice chips you suck on. Fruit juice with water added to it (diluted). Sports drinks. Hot or cold herbal teas. Broth-based soups. Milk or milk products. Food Follow instructions from your health care provider about what to eat while you rehydrate. Your health care provider may recommend that you slowly begin eating regular foods in small amounts. Eat foods that contain a healthy balance of electrolytes, such as bananas, oranges, potatoes, tomatoes, and spinach. Avoid foods that are greasy or contain a lot of sugar. In some cases, you may get nutrition through a feeding tube that is passed through your nose and into your stomach (nasogastric tube, or NG tube). This may be done if you have uncontrolled vomiting or diarrhea. Drinks to avoid  Certain drinks may make dehydration worse. While you rehydrate, avoid drinking alcohol. How to tell if you are recovering from dehydration You may be getting better if: You are urinating more often than before you started rehydrating. Your urine is pale yellow. Your energy level improves. You vomit less   often. You have diarrhea less often. Your appetite improves or returns to normal. You feel less dizzy or light-headed. Your skin tone and color start to look more normal. Follow these instructions at home: Take over-the-counter and prescription medicines only  as told by your health care provider. Do not take sodium tablets. Doing this can lead to having too much sodium in your body (hypernatremia). Contact a health care provider if: You continue to have symptoms of mild or moderate dehydration, such as: Thirst. Dry lips. Slightly dry mouth. Dizziness. Dark urine or less urine than normal. Muscle cramps. You continue to vomit or have diarrhea. Get help right away if: You have symptoms of dehydration that get worse. You have a fever. You have a severe headache. You have been vomiting and have problems, such as: Your vomiting gets worse or does not go away. Your vomit includes blood or green matter (bile). You cannot eat or drink without vomiting. You have problems with urination or bowel movements, such as: Diarrhea that gets worse or does not go away. Blood in your stool (feces). This may cause stool to look black and tarry. Not urinating, or urinating only a small amount of very dark urine, within 6-8 hours. You have trouble breathing. You have symptoms that get worse with treatment. These symptoms may be an emergency. Get help right away. Call 911. Do not wait to see if the symptoms will go away. Do not drive yourself to the hospital. This information is not intended to replace advice given to you by your health care provider. Make sure you discuss any questions you have with your health care provider. Document Revised: 07/03/2021 Document Reviewed: 07/03/2021  Hypokalemia Hypokalemia means that the amount of potassium in the blood is lower than normal. Potassium is a mineral (electrolyte) that helps regulate the amount of fluid in the body. It also stimulates muscle tightening (contraction) and helps nerves work properly. Normally, most of the body's potassium is inside cells, and only a very small amount is in the blood. Because the amount in the blood is so small, minor changes to potassium levels in the blood can be  life-threatening. What are the causes? This condition may be caused by: Antibiotic medicine. Diarrhea or vomiting. Taking too much of a medicine that helps you have a bowel movement (laxative) can cause diarrhea and lead to hypokalemia. Chronic kidney disease (CKD). Medicines that help the body get rid of excess fluid (diuretics). Eating disorders, such as anorexia or bulimia. Low magnesium levels in the body. Sweating a lot. What are the signs or symptoms? Symptoms of this condition include: Weakness. Constipation. Fatigue. Muscle cramps. Mental confusion. Skipped heartbeats or irregular heartbeat (palpitations). Tingling or numbness. How is this diagnosed? This condition is diagnosed with a blood test. How is this treated? This condition may be treated by: Taking potassium supplements. Adjusting the medicines that you take. Eating more foods that contain a lot of potassium. If your potassium level is very low, you may need to get potassium through an IV and be monitored in the hospital. Follow these instructions at home: Eating and drinking  Eat a healthy diet. A healthy diet includes fresh fruits and vegetables, whole grains, healthy fats, and lean proteins. If told, eat more foods that contain a lot of potassium. These include: Nuts, such as peanuts and pistachios. Seeds, such as sunflower seeds and pumpkin seeds. Peas, lentils, and lima beans. Whole grain and bran cereals and breads. Fresh fruits and vegetables, such as apricots, avocado,  bananas, cantaloupe, kiwi, oranges, tomatoes, asparagus, and potatoes. Juices, such as orange, tomato, and prune. Lean meats, including fish. Milk and milk products, such as yogurt. General instructions Take over-the-counter and prescription medicines only as told by your health care provider. This includes vitamins, natural food products, and supplements. Keep all follow-up visits. This is important. Contact a health care provider  if: You have weakness that gets worse. You feel your heart pounding or racing. You vomit. You have diarrhea. You have diabetes and you have trouble keeping your blood sugar in your target range. Get help right away if: You have chest pain. You have shortness of breath. You have vomiting or diarrhea that lasts for more than 2 days. You faint. These symptoms may be an emergency. Get help right away. Call 911. Do not wait to see if the symptoms will go away. Do not drive yourself to the hospital. Summary Hypokalemia means that the amount of potassium in the blood is lower than normal. This condition is diagnosed with a blood test. Hypokalemia may be treated by taking potassium supplements, adjusting the medicines that you take, or eating more foods that are high in potassium. If your potassium level is very low, you may need to get potassium through an IV and be monitored in the hospital. This information is not intended to replace advice given to you by your health care provider. Make sure you discuss any questions you have with your health care provider. Document Revised: 11/03/2020 Document Reviewed: 11/03/2020 Elsevier Patient Education  [place].

## 2022-01-01 ENCOUNTER — Telehealth: Payer: Self-pay | Admitting: Hematology and Oncology

## 2022-01-01 ENCOUNTER — Telehealth: Payer: Self-pay | Admitting: *Deleted

## 2022-01-01 MED ORDER — NYSTATIN-TRIAMCINOLONE 100000-0.1 UNIT/GM-% EX OINT: 1.0000 | TOPICAL_OINTMENT | Freq: Two times a day (BID) | CUTANEOUS | 0 refills | Status: DC

## 2022-01-01 MED ORDER — NYSTATIN 100000 UNIT/ML MT SUSP: 5.0000 mL | Freq: Four times a day (QID) | OROMUCOSAL | 0 refills | Status: DC

## 2022-01-01 NOTE — Telephone Encounter (Signed)
Received call from pt with complaint of ongoing cough with mucus.  Pt denies fever at this time.  Per MD pt to be prescribed Amoxicillin 500 mg p.o BID x5 days.  Pt also states she has hx of yeast infection with p.o antibiotics.  Verbal orders received from MD for pt to be prescribed Nystatin ointment.  Prescription sent to pharmacy on file. Pt educated and verbalized understanding.

## 2022-01-01 NOTE — Telephone Encounter (Signed)
Scheduled appointment per 10/26 los. Patient is aware. 

## 2022-01-01 NOTE — Telephone Encounter (Signed)
Scheduled appointment per 10/26 los. Unable to make contact with patient due to call not going through. Will FU.

## 2022-01-03 ENCOUNTER — Encounter: Payer: Self-pay | Admitting: Internal Medicine

## 2022-01-03 ENCOUNTER — Other Ambulatory Visit: Payer: Self-pay | Admitting: *Deleted

## 2022-01-03 ENCOUNTER — Ambulatory Visit (INDEPENDENT_AMBULATORY_CARE_PROVIDER_SITE_OTHER): Payer: 59 | Admitting: Internal Medicine

## 2022-01-03 VITALS — BP 124/80 | HR 84 | Temp 98.8°F | Ht 69.0 in | Wt 295.0 lb

## 2022-01-03 DIAGNOSIS — R7303 Prediabetes: Secondary | ICD-10-CM | POA: Diagnosis not present

## 2022-01-03 DIAGNOSIS — C50511 Malignant neoplasm of lower-outer quadrant of right female breast: Secondary | ICD-10-CM

## 2022-01-03 DIAGNOSIS — C50919 Malignant neoplasm of unspecified site of unspecified female breast: Secondary | ICD-10-CM | POA: Diagnosis not present

## 2022-01-03 DIAGNOSIS — I10 Essential (primary) hypertension: Secondary | ICD-10-CM

## 2022-01-03 DIAGNOSIS — R051 Acute cough: Secondary | ICD-10-CM | POA: Diagnosis not present

## 2022-01-03 DIAGNOSIS — Z6841 Body Mass Index (BMI) 40.0 and over, adult: Secondary | ICD-10-CM

## 2022-01-03 LAB — POC COVID19 BINAXNOW: SARS Coronavirus 2 Ag: NEGATIVE

## 2022-01-03 MED ORDER — AMLODIPINE BESYLATE 10 MG PO TABS: 10.0000 mg | ORAL_TABLET | Freq: Every day | ORAL | 2 refills | Status: DC

## 2022-01-03 MED ORDER — HYDROCODONE BIT-HOMATROP MBR 5-1.5 MG/5ML PO SOLN: 5.0000 mL | Freq: Four times a day (QID) | ORAL | 0 refills | Status: DC | PRN

## 2022-01-03 MED ORDER — BENZONATATE 100 MG PO CAPS: 100.0000 mg | ORAL_CAPSULE | Freq: Three times a day (TID) | ORAL | 1 refills | Status: DC | PRN

## 2022-01-03 NOTE — Progress Notes (Signed)
Rich Brave [place]leton,acting as a Education administrator for Pamela Greenland, MD.,have documented all relevant documentation on the behalf of Pamela Greenland, MD,as directed by  Pamela Greenland, MD while in the presence of Pamela Greenland, MD.    Subjective:     Patient ID: Pamela [name] , female    DOB: 05/25/1965 , 56 y.o.   MRN: [id number]   Chief Complaint  Patient presents with   Hypertension   URI    HPI  The patient is here today for a blood pressure f/u.  She reports compliance with meds. Patient stated she has been coughing since last week. She c/o dry cough. She has not tried any OTC meds for allergies. She is currently on amoxicillin as per her Oncologist.   Hypertension This is a chronic problem. The current episode started more than 1 year ago. The problem has been gradually improving since onset. The problem is controlled. There are no associated agents to hypertension. Risk factors for coronary artery disease include obesity and sedentary lifestyle. Past treatments include diuretics and angiotensin blockers. There are no compliance problems.  There is no history of angina. There is no history of chronic renal disease.  URI  Associated symptoms include congestion, coughing and a sore throat.     Past Medical History:  Diagnosis Date   Anxiety    Asthma    Back pain    Breast cancer ([facility])    Constipation    DUB (dysfunctional uterine bleeding) 2007   Edema of both lower extremities    Elevated cholesterol    Family history of breast cancer 05/12/2020   Family history of lung cancer 05/12/2020   Food allergy    H/O menorrhagia 05/01/2006   High cholesterol    History of ovarian cyst 2007   Hypertension 03/31/2005   Increased BMI 07/11/2006   Joint pain    Migraine    N&V (nausea and vomiting)    Obesity 2007   Obstructive sleep apnea syndrome, moderate 10/27/2013   no cpap   Sleep apnea    SOB (shortness of breath)    Vitamin D deficiency    Vitamin D deficiency     Weight loss 07/11/2006     Family History  Problem Relation Age of Onset   Hypertension Mother    High Cholesterol Mother    Thyroid disease Mother    Cancer Mother    Sleep apnea Mother    Hypertension Father    Heart attack Father    Sudden death Father    Diabetes Maternal Grandmother    Bone cancer Maternal Grandfather        dx after 58   Breast cancer Maternal Aunt        dx 22s   Lung cancer Maternal Aunt        dx after 50     Current Outpatient Medications:    amoxicillin (AMOXIL) 500 MG tablet, Take 1 tablet (500 mg total) by mouth 2 (two) times daily., Disp: 14 tablet, Rfl: 0   aspirin EC 81 MG tablet, Take 1 tablet (81 mg total) by mouth daily. Swallow whole., Disp: 30 tablet, Rfl: 11   Azilsartan-Chlorthalidone (EDARBYCLOR) 40-25 MG TABS, Take 1 tablet by mouth daily., Disp: 90 tablet, Rfl: 2   benzonatate (TESSALON PERLES) 100 MG capsule, Take 1 capsule (100 mg total) by mouth 3 (three) times daily as needed for cough., Disp: 30 capsule, Rfl: 1   dexamethasone (DECADRON) 4 MG tablet, Take  1 tablet (4 mg total) by mouth 2 (two) times daily with a meal. Day 2,3,4 one tablet with lunch, Disp: 30 tablet, Rfl: 1   diphenoxylate-atropine (LOMOTIL) 2.5-0.025 MG tablet, Take 1 tablet by mouth 4 (four) times daily as needed for diarrhea or loose stools., Disp: 30 tablet, Rfl: 0   HYDROcodone bit-homatropine (HYDROMET) 5-1.5 MG/5ML syrup, Take 5 mLs by mouth every 6 (six) hours as needed., Disp: 120 mL, Rfl: 0   levonorgestrel (MIRENA, 52 MG,) 20 MCG/DAY IUD, 1 each by Intrauterine route once., Disp: , Rfl:    lidocaine-prilocaine (EMLA) cream, Apply 1 application topically daily as needed. (Patient taking differently: Apply 1 application  topically daily as needed (port access).), Disp: 60 g, Rfl: 0   LORazepam (ATIVAN) 0.5 MG tablet, Take 1 tablet (0.5 mg total) by mouth every 8 (eight) hours as needed for anxiety., Disp: 30 tablet, Rfl: 0   montelukast (SINGULAIR) 10 MG  tablet, Take 1 tablet (10 mg total) by mouth daily. (Patient taking differently: Take 10 mg by mouth daily as needed (allergies).), Disp: 90 tablet, Rfl: 0   nystatin (MYCOSTATIN) 100000 UNIT/ML suspension, Take 5 mLs (500,000 Units total) by mouth 4 (four) times daily., Disp: 60 mL, Rfl: 0   nystatin-triamcinolone ointment (MYCOLOG), Apply 1 Application topically 2 (two) times daily., Disp: 30 g, Rfl: 0   oxyCODONE (OXY IR/ROXICODONE) 5 MG immediate release tablet, Take 1 tablet (5 mg total) by mouth every 6 (six) hours as needed for severe pain., Disp: 15 tablet, Rfl: 0   pantoprazole (PROTONIX) 40 MG tablet, Take 1 tablet (40 mg total) by mouth daily., Disp: 30 tablet, Rfl: 1   potassium chloride SA (KLOR-CON M) 20 MEQ tablet, Take 1 tablet (20 mEq total) by mouth daily., Disp: 30 tablet, Rfl: 0   prochlorperazine (COMPAZINE) 10 MG tablet, Take 10 mg by mouth every 6 (six) hours as needed for nausea or vomiting., Disp: , Rfl:    rosuvastatin (CRESTOR) 40 MG tablet, Take 1 tablet (40 mg total) by mouth daily., Disp: 90 tablet, Rfl: 3   amLODipine (NORVASC) 10 MG tablet, Take 1 tablet (10 mg total) by mouth daily., Disp: 90 tablet, Rfl: 2 No current facility-administered medications for this visit.  Facility-Administered Medications Ordered in Other Visits:    heparin lock flush 100 unit/mL, 500 Units, Intracatheter, Once, Gudena, Vinay, MD   sodium chloride flush (NS) 0.9 % injection 10 mL, 10 mL, Intracatheter, Once, Nicholas Lose, MD   Allergies  Allergen Reactions   Pollen Extract Shortness Of Breath   Compazine [Prochlorperazine] Anxiety    IV compazine causes severe anxiety attack   Latex Rash   Codeine Hives   Diflucan [Fluconazole] Hives   Shellfish Allergy Hives   Betadine [Povidone Iodine] Rash     Review of Systems  Constitutional:  Negative for chills and fever.  HENT:  Positive for congestion and sore throat. Negative for postnasal drip.   Respiratory:  Positive for cough.    Cardiovascular: Negative.   Gastrointestinal: Negative.   Neurological: Negative.   Psychiatric/Behavioral: Negative.       Today's Vitals   01/03/22 1018  BP: 124/80  Pulse: 84  Temp: 98.8 F (37.1 C)  Weight: 295 lb (133.8 kg)  Height: '5\' 9"'$  (1.753 m)  PainSc: 0-No pain   Body mass index is 43.56 kg/m.   Wt Readings from Last 3 Encounters:  01/03/22 295 lb (133.8 kg)  12/30/21 295 lb (133.8 kg)  12/28/21 295 lb 3.2 oz (133.9  kg)    Objective:  Physical Exam Vitals and nursing note reviewed.  Constitutional:      Appearance: Normal appearance. She is ill-appearing.  HENT:     Head: Normocephalic and atraumatic.     Right Ear: Tympanic membrane, ear canal and external ear normal. There is no impacted cerumen.     Left Ear: Tympanic membrane, ear canal and external ear normal. There is no impacted cerumen.     Nose:     Comments: Masked     Mouth/Throat:     Comments: Masked Eyes:     Extraocular Movements: Extraocular movements intact.  Cardiovascular:     Rate and Rhythm: Normal rate and regular rhythm.     Heart sounds: Normal heart sounds.  Pulmonary:     Effort: Pulmonary effort is normal.     Breath sounds: Normal breath sounds.     Comments: Frequent coughing Musculoskeletal:     Cervical back: Normal range of motion.  Skin:    General: Skin is warm.  Neurological:     General: No focal deficit present.     Mental Status: She is alert.  Psychiatric:        Mood and Affect: Mood normal.        Behavior: Behavior normal.       Assessment And Plan:     1. Essential hypertension Comments: Chronic, well controlled. She will c/w current meds. She is encouraged to limit her sodium intake.  - amLODipine (NORVASC) 10 MG tablet; Take 1 tablet (10 mg total) by mouth daily.  Dispense: 90 tablet; Refill: 2  2. Acute cough Comments: Rapid test negative, she agrees to PCR testing.  We discussed increased potential for false negative rapid COVID-19 tests  early in illness with current circulating strains and PCR testing is recommended. Isolate while awaiting results. Also discussed sx are consistent with viral syndrome - supportive care and contagious nature of illness reviewed. Will prescribe Hydromet syrup and tessalon perles as needed for cough. She is also encouraged to stay well hydrated, rest and take Tylenol prn fever/sore throat.     - POC COVID-19 - Novel Coronavirus, NAA ([facility])  3. Prediabetes Comments: Her a1c has been elevated in the past. I will recheck this tomorrow. She prefers to have drawn at the [facility].  - Hemoglobin A1c; Future  4. Malignant neoplasm of female breast, unspecified estrogen receptor status, unspecified laterality, unspecified site of breast [facility]) Comments: Recent Oncology notes reviewed. She will c/w her current treatment plan.  5. Class 3 severe obesity due to excess calories with serious comorbidity and body mass index (BMI) of 40.0 to 44.9 in adult [facility]) Comments: BMI 43, she is currently in treatment for cancer.    Patient was given opportunity to ask questions. Patient verbalized understanding of the plan and was able to repeat key elements of the plan. All questions were answered to their satisfaction.   I, Pamela Greenland, MD, have reviewed all documentation for this visit. The documentation on 01/03/22 for the exam, diagnosis, procedures, and orders are all accurate and complete.   IF YOU HAVE BEEN REFERRED TO A SPECIALIST, IT MAY TAKE 1-2 WEEKS TO SCHEDULE/PROCESS THE REFERRAL. IF YOU HAVE NOT HEARD FROM US/SPECIALIST IN TWO WEEKS, PLEASE GIVE [place] A CALL AT [phone].   THE PATIENT IS ENCOURAGED TO PRACTICE SOCIAL DISTANCING DUE TO THE COVID-19 PANDEMIC.

## 2022-01-04 ENCOUNTER — Other Ambulatory Visit: Payer: Self-pay | Admitting: *Deleted

## 2022-01-04 ENCOUNTER — Inpatient Hospital Stay: Payer: 59 | Attending: Hematology and Oncology

## 2022-01-04 ENCOUNTER — Other Ambulatory Visit: Payer: Self-pay

## 2022-01-04 ENCOUNTER — Inpatient Hospital Stay: Payer: 59

## 2022-01-04 ENCOUNTER — Other Ambulatory Visit: Payer: Self-pay | Admitting: Internal Medicine

## 2022-01-04 VITALS — BP 142/75 | HR 74 | Temp 98.2°F | Resp 18

## 2022-01-04 DIAGNOSIS — R5383 Other fatigue: Secondary | ICD-10-CM | POA: Insufficient documentation

## 2022-01-04 DIAGNOSIS — Z885 Allergy status to narcotic agent status: Secondary | ICD-10-CM | POA: Insufficient documentation

## 2022-01-04 DIAGNOSIS — C50919 Malignant neoplasm of unspecified site of unspecified female breast: Secondary | ICD-10-CM

## 2022-01-04 DIAGNOSIS — Z5112 Encounter for antineoplastic immunotherapy: Secondary | ICD-10-CM | POA: Diagnosis present

## 2022-01-04 DIAGNOSIS — Z888 Allergy status to other drugs, medicaments and biological substances status: Secondary | ICD-10-CM | POA: Insufficient documentation

## 2022-01-04 DIAGNOSIS — T451X5A Adverse effect of antineoplastic and immunosuppressive drugs, initial encounter: Secondary | ICD-10-CM | POA: Diagnosis not present

## 2022-01-04 DIAGNOSIS — Z170 Estrogen receptor positive status [ER+]: Secondary | ICD-10-CM | POA: Insufficient documentation

## 2022-01-04 DIAGNOSIS — E876 Hypokalemia: Secondary | ICD-10-CM | POA: Insufficient documentation

## 2022-01-04 DIAGNOSIS — Z883 Allergy status to other anti-infective agents status: Secondary | ICD-10-CM | POA: Diagnosis not present

## 2022-01-04 DIAGNOSIS — R110 Nausea: Secondary | ICD-10-CM | POA: Diagnosis not present

## 2022-01-04 DIAGNOSIS — R7303 Prediabetes: Secondary | ICD-10-CM

## 2022-01-04 DIAGNOSIS — D6481 Anemia due to antineoplastic chemotherapy: Secondary | ICD-10-CM | POA: Insufficient documentation

## 2022-01-04 DIAGNOSIS — R197 Diarrhea, unspecified: Secondary | ICD-10-CM | POA: Diagnosis not present

## 2022-01-04 DIAGNOSIS — R42 Dizziness and giddiness: Secondary | ICD-10-CM | POA: Insufficient documentation

## 2022-01-04 DIAGNOSIS — C50511 Malignant neoplasm of lower-outer quadrant of right female breast: Secondary | ICD-10-CM

## 2022-01-04 DIAGNOSIS — Z79899 Other long term (current) drug therapy: Secondary | ICD-10-CM | POA: Diagnosis not present

## 2022-01-04 DIAGNOSIS — R059 Cough, unspecified: Secondary | ICD-10-CM | POA: Insufficient documentation

## 2022-01-04 DIAGNOSIS — Z95828 Presence of other vascular implants and grafts: Secondary | ICD-10-CM

## 2022-01-04 LAB — CMP (CANCER CENTER ONLY)
ALT: 14 U/L (ref 0–44)
AST: 29 U/L (ref 15–41)
Albumin: 3.6 g/dL (ref 3.5–5.0)
Alkaline Phosphatase: 61 U/L (ref 38–126)
Anion gap: 5 (ref 5–15)
BUN: 9 mg/dL (ref 6–20)
CO2: 31 mmol/L (ref 22–32)
Calcium: 9.4 mg/dL (ref 8.9–10.3)
Chloride: 105 mmol/L (ref 98–111)
Creatinine: 0.71 mg/dL (ref 0.44–1.00)
GFR, Estimated: >60 mL/min (ref >60)
Glucose, Bld: 104 mg/dL — ABNORMAL HIGH (ref 70–99)
Potassium: 3.5 mmol/L (ref 3.5–5.1)
Sodium: 141 mmol/L (ref 135–145)
Total Bilirubin: 0.6 mg/dL (ref 0.3–1.2)
Total Protein: 6.6 g/dL (ref 6.5–8.1)

## 2022-01-04 LAB — CBC WITH DIFFERENTIAL (CANCER CENTER ONLY)
Abs Immature Granulocytes: 0.01 10*3/uL (ref 0.00–0.07)
Basophils Absolute: 0 10*3/uL (ref 0.0–0.1)
Basophils Relative: 1 %
Eosinophils Absolute: 0.4 10*3/uL (ref 0.0–0.5)
Eosinophils Relative: 9 %
HCT: 30.8 % — ABNORMAL LOW (ref 36.0–46.0)
Hemoglobin: 10.3 g/dL — ABNORMAL LOW (ref 12.0–15.0)
Immature Granulocytes: 0 %
Lymphocytes Relative: 19 %
Lymphs Abs: 1 10*3/uL (ref 0.7–4.0)
MCH: 29.9 pg (ref 26.0–34.0)
MCHC: 33.4 g/dL (ref 30.0–36.0)
MCV: 89.3 fL (ref 80.0–100.0)
Monocytes Absolute: 0.4 10*3/uL (ref 0.1–1.0)
Monocytes Relative: 8 %
Neutro Abs: 3.2 10*3/uL (ref 1.7–7.7)
Neutrophils Relative %: 63 %
Platelet Count: 181 10*3/uL (ref 150–400)
RBC: 3.45 MIL/uL — ABNORMAL LOW (ref 3.87–5.11)
RDW: 14 % (ref 11.5–15.5)
WBC Count: 5 10*3/uL (ref 4.0–10.5)
nRBC: 0 % (ref 0.0–0.2)

## 2022-01-04 LAB — NOVEL CORONAVIRUS, NAA: SARS-CoV-2, NAA: NOT DETECTED

## 2022-01-04 MED ORDER — SODIUM CHLORIDE 0.9% FLUSH
10.0000 mL | Freq: Once | INTRAVENOUS | Status: AC
  Administered 2022-01-04: 10 mL

## 2022-01-04 MED ORDER — SODIUM CHLORIDE 0.9 % IV SOLN: INTRAVENOUS | Status: AC

## 2022-01-04 MED ORDER — POTASSIUM CHLORIDE 10 MEQ/100ML IV SOLN
10.0000 meq | Freq: Once | INTRAVENOUS | Status: AC
  Administered 2022-01-04: 10 meq via INTRAVENOUS
  Filled 2022-01-04: qty 100

## 2022-01-04 NOTE — Patient Instructions (Signed)
Rehydration, Adult Rehydration is the replacement of fluids, salts, and minerals in the body (electrolytes) that are lost during dehydration. Dehydration is when there is not enough water or other fluids in the body. This happens when you lose more fluids than you take in. Common causes of dehydration include: Not drinking enough fluids. This can occur when you are ill or doing activities that require a lot of energy, especially in hot weather. Conditions that cause loss of water or other fluids. These include diarrhea, vomiting, sweating, and urinating a lot. Other illnesses, such as fever or infection. Certain medicines, such as those that remove excess fluid from the body (diuretics). Symptoms of mild or moderate dehydration may include thirst, dry lips and mouth, and dizziness. Symptoms of severe dehydration may include increased heart rate, confusion, fainting, and not urinating. In severe cases, you may need to get fluids through an IV at the hospital. For mild or moderate cases, you can usually rehydrate at home by drinking certain fluids as told by your health care provider. What are the risks? Your health care provider will talk with you about risks. Your health care provider will talk with you about risks. This may include taking in too much fluid (overhydration). This is rare. Overhydration can cause an imbalance of electrolytes in the body, kidney failure, or a decrease in salt (sodium) levels in the body. Supplies needed: You will need an oral rehydration solution (ORS) if your health care provider tells you to use one. This is a drink to treat dehydration. It can be found in pharmacies and retail stores. How to rehydrate Fluids Follow instructions from your health care provider about what to drink. The kind of fluid and the amount you should drink depend on your condition. In general, you should choose drinks that you prefer. If told by your health care provider, drink an ORS. Make an  ORS by following instructions on the package. Start by drinking small amounts, about  cup (120 mL) every 5-10 minutes. Slowly increase how much you drink until you have taken in the amount recommended by your health care provider. Drink enough clear fluids to keep your urine pale yellow. If you were told to drink an ORS, finish it first, then start slowly drinking other clear fluids. Drink fluids such as: Water. This includes sparkling and flavored water. Drinking only water can lead to having too little sodium in your body (hyponatremia). Follow the advice of your health care provider. Water from ice chips you suck on. Fruit juice with water added to it (diluted). Sports drinks. Hot or cold herbal teas. Broth-based soups. Milk or milk products. Food Follow instructions from your health care provider about what to eat while you rehydrate. Your health care provider may recommend that you slowly begin eating regular foods in small amounts. Eat foods that contain a healthy balance of electrolytes, such as bananas, oranges, potatoes, tomatoes, and spinach. Avoid foods that are greasy or contain a lot of sugar. In some cases, you may get nutrition through a feeding tube that is passed through your nose and into your stomach (nasogastric tube, or NG tube). This may be done if you have uncontrolled vomiting or diarrhea. Drinks to avoid  Certain drinks may make dehydration worse. While you rehydrate, avoid drinking alcohol. How to tell if you are recovering from dehydration You may be getting better if: You are urinating more often than before you started rehydrating. Your urine is pale yellow. Your energy level improves. You vomit less   often. You have diarrhea less often. Your appetite improves or returns to normal. You feel less dizzy or light-headed. Your skin tone and color start to look more normal. Follow these instructions at home: Take over-the-counter and prescription medicines only  as told by your health care provider. Do not take sodium tablets. Doing this can lead to having too much sodium in your body (hypernatremia). Contact a health care provider if: You continue to have symptoms of mild or moderate dehydration, such as: Thirst. Dry lips. Slightly dry mouth. Dizziness. Dark urine or less urine than normal. Muscle cramps. You continue to vomit or have diarrhea. Get help right away if: You have symptoms of dehydration that get worse. You have a fever. You have a severe headache. You have been vomiting and have problems, such as: Your vomiting gets worse or does not go away. Your vomit includes blood or green matter (bile). You cannot eat or drink without vomiting. You have problems with urination or bowel movements, such as: Diarrhea that gets worse or does not go away. Blood in your stool (feces). This may cause stool to look black and tarry. Not urinating, or urinating only a small amount of very dark urine, within 6-8 hours. You have trouble breathing. You have symptoms that get worse with treatment. These symptoms may be an emergency. Get help right away. Call 911. Do not wait to see if the symptoms will go away. Do not drive yourself to the hospital. This information is not intended to replace advice given to you by your health care provider. Make sure you discuss any questions you have with your health care provider. Document Revised: 07/03/2021 Document Reviewed: 07/03/2021  Hypokalemia Hypokalemia means that the amount of potassium in the blood is lower than normal. Potassium is a mineral (electrolyte) that helps regulate the amount of fluid in the body. It also stimulates muscle tightening (contraction) and helps nerves work properly. Normally, most of the body's potassium is inside cells, and only a very small amount is in the blood. Because the amount in the blood is so small, minor changes to potassium levels in the blood can be  life-threatening. What are the causes? This condition may be caused by: Antibiotic medicine. Diarrhea or vomiting. Taking too much of a medicine that helps you have a bowel movement (laxative) can cause diarrhea and lead to hypokalemia. Chronic kidney disease (CKD). Medicines that help the body get rid of excess fluid (diuretics). Eating disorders, such as anorexia or bulimia. Low magnesium levels in the body. Sweating a lot. What are the signs or symptoms? Symptoms of this condition include: Weakness. Constipation. Fatigue. Muscle cramps. Mental confusion. Skipped heartbeats or irregular heartbeat (palpitations). Tingling or numbness. How is this diagnosed? This condition is diagnosed with a blood test. How is this treated? This condition may be treated by: Taking potassium supplements. Adjusting the medicines that you take. Eating more foods that contain a lot of potassium. If your potassium level is very low, you may need to get potassium through an IV and be monitored in the hospital. Follow these instructions at home: Eating and drinking  Eat a healthy diet. A healthy diet includes fresh fruits and vegetables, whole grains, healthy fats, and lean proteins. If told, eat more foods that contain a lot of potassium. These include: Nuts, such as peanuts and pistachios. Seeds, such as sunflower seeds and pumpkin seeds. Peas, lentils, and lima beans. Whole grain and bran cereals and breads. Fresh fruits and vegetables, such as apricots, avocado,  bananas, cantaloupe, kiwi, oranges, tomatoes, asparagus, and potatoes. Juices, such as orange, tomato, and prune. Lean meats, including fish. Milk and milk products, such as yogurt. General instructions Take over-the-counter and prescription medicines only as told by your health care provider. This includes vitamins, natural food products, and supplements. Keep all follow-up visits. This is important. Contact a health care provider  if: You have weakness that gets worse. You feel your heart pounding or racing. You vomit. You have diarrhea. You have diabetes and you have trouble keeping your blood sugar in your target range. Get help right away if: You have chest pain. You have shortness of breath. You have vomiting or diarrhea that lasts for more than 2 days. You faint. These symptoms may be an emergency. Get help right away. Call 911. Do not wait to see if the symptoms will go away. Do not drive yourself to the hospital. Summary Hypokalemia means that the amount of potassium in the blood is lower than normal. This condition is diagnosed with a blood test. Hypokalemia may be treated by taking potassium supplements, adjusting the medicines that you take, or eating more foods that are high in potassium. If your potassium level is very low, you may need to get potassium through an IV and be monitored in the hospital. This information is not intended to replace advice given to you by your health care provider. Make sure you discuss any questions you have with your health care provider. Document Revised: 11/03/2020 Document Reviewed: 11/03/2020 Elsevier Patient Education  [place].

## 2022-01-05 LAB — HEMOGLOBIN A1C
Est. average glucose Bld gHb Est-mCnc: 105 mg/dL
Hgb A1c MFr Bld: 5.3 % (ref 4.8–5.6)

## 2022-01-14 NOTE — Progress Notes (Signed)
Patient Care Team: Dorothyann Peng, MD as PCP - General (Internal Medicine) Kathryne Hitch, MD as Consulting Physician (Orthopedic Surgery) Pershing Proud, RN as Oncology Nurse Navigator Donnelly Angelica, RN as Oncology Nurse Navigator Harriette Bouillon, MD as Consulting Physician (General Surgery) Serena Croissant, MD as Consulting Physician (Hematology and Oncology) Lonie Peak, MD as Attending Physician (Radiation Oncology)  DIAGNOSIS:  Encounter Diagnosis  Name Primary?   Malignant neoplasm of lower-outer quadrant of right breast of female, estrogen receptor positive (HCC) Yes    SUMMARY OF ONCOLOGIC HISTORY: Oncology History  Malignant neoplasm of lower-outer quadrant of right breast of female, estrogen receptor positive (HCC)  05/03/2020 Initial Diagnosis   Screening mammogram showed a 1.0cm mass at the 6 o'clock position in the right breast. Diagnostic mammogram and US showed the 1.2cm mass at the 6 o'clock position in the right breast. Biopsy showed IDC, grade 3, HER-2 positive (3+), ER 15% weak, PR-, Ki67 80%.   05/11/2020 Cancer Staging   Staging form: Breast, AJCC 8th Edition - Clinical stage from 05/11/2020: Stage IA (cT1b, cN0, cM0, G3, ER+, PR-, HER2+) - Signed by Serena Croissant, MD on 05/11/2020 Stage prefix: Initial diagnosis   05/19/2020 Genetic Testing   Negative hereditary cancer genetic testing: no pathogenic variants detected in [facility] Breast STAT Panel and Common Hereditary Cancers Panel.  The report dates are May 19, 2020 (STAT) and May 23, 2020 (Common Hereditary).   The Common Hereditary Cancers Panel offered by [facility] includes sequencing and/or deletion duplication testing of the following 47 genes: APC, ATM, AXIN2, BARD1, BMPR1A, BRCA1, BRCA2, BRIP1, CDH1, CDK4, CDKN2A (p14ARF), CDKN2A (p16INK4a), CHEK2, CTNNA1, DICER1, EPCAM (Deletion/duplication testing only), GREM1 (promoter region deletion/duplication testing only), GREM1, HOXB13, KIT, MEN1, MLH1,  MSH2, MSH3, MSH6, MUTYH, NBN, NF1, NHTL1, PALB2, PDGFRA, PMS2, POLD1, POLE, PTEN, RAD50, RAD51C, RAD51D, SDHA, SDHB, SDHC, SDHD, SMAD4, SMARCA4. STK11, TP53, TSC1, TSC2, and VHL.  The following genes were evaluated for sequence changes only: SDHA and HOXB13 c.251G>A variant only.es only: SDHA and HOXB13 c.251G>A variant only.   06/02/2020 Surgery   Right lumpectomy ([facility]): invasive and in situ ductal carcinoma, 1.3cm, clear margins, 1 right axillary lymph node negative for carcinoma.   06/02/2020 Cancer Staging   Staging form: Breast, AJCC 8th Edition - Pathologic stage from 06/02/2020: Stage IA (pT1c, pN0, cM0, G3, ER+, PR-, HER2+) - Signed by Loa Socks, NP on 06/15/2020 Stage prefix: Initial diagnosis Histologic grading system: 3 grade system   07/08/2020 - 10/21/2020 Chemotherapy   Taxol Herceptin followed by Herceptin maintenance   11/11/2020 -  Chemotherapy   Herceptin maintenance       04/14/2021 -  Anti-estrogen oral therapy   Tamoxifen 10 mg   08/21/2021 - 10/26/2021 Chemotherapy   Patient is on Treatment Plan : BREAST METASTATIC fam-trastuzumab deruxtecan-nxki (Enhertu) q21d     11/15/2021 -  Chemotherapy   Patient is on Treatment Plan : BREAST METASTATIC Fam-Trastuzumab Deruxtecan-nxki (Enhertu) (5.4) q21d       CHIEF COMPLIANT:  Follow-up metastatic breast cancer on cycle 7 Enhertu    INTERVAL HISTORY: Pamela [name] is a 56 y.o. with the above-mentioned metastatic breast cancer currently on treatment with Enhertu. She presents to the clinic for a follow-up and treatment. She states that she did good and she feels pretty good. She says diarrhea wasn't bad at all and she didn't have to take any medications. She ate different foods to control what she eats, and says appetite is back. She still having a cough  that she has had for over a month. She says it happens most of the time when she drinks something cold. It's more of a dry cough.   ALLERGIES:  is allergic  to pollen extract, compazine [prochlorperazine], latex, codeine, diflucan [fluconazole], shellfish allergy, and betadine [povidone iodine].  MEDICATIONS:  Current Outpatient Medications  Medication Sig Dispense Refill   amLODipine (NORVASC) 10 MG tablet Take 1 tablet (10 mg total) by mouth daily. 90 tablet 2   amoxicillin (AMOXIL) 500 MG tablet Take 1 tablet (500 mg total) by mouth 2 (two) times daily. 14 tablet 0   aspirin EC 81 MG tablet Take 1 tablet (81 mg total) by mouth daily. Swallow whole. 30 tablet 11   Azilsartan-Chlorthalidone (EDARBYCLOR) 40-25 MG TABS Take 1 tablet by mouth daily. 90 tablet 2   benzonatate (TESSALON PERLES) 100 MG capsule Take 1 capsule (100 mg total) by mouth 3 (three) times daily as needed for cough. 30 capsule 1   dexamethasone (DECADRON) 4 MG tablet Take 1 tablet (4 mg total) by mouth 2 (two) times daily with a meal. Day 2,3,4 one tablet with lunch 30 tablet 1   diphenoxylate-atropine (LOMOTIL) 2.5-0.025 MG tablet Take 1 tablet by mouth 4 (four) times daily as needed for diarrhea or loose stools. 30 tablet 0   HYDROcodone bit-homatropine (HYDROMET) 5-1.5 MG/5ML syrup Take 5 mLs by mouth every 6 (six) hours as needed. 120 mL 0   levonorgestrel (MIRENA, 52 MG,) 20 MCG/DAY IUD 1 each by Intrauterine route once.     lidocaine-prilocaine (EMLA) cream Apply 1 application topically daily as needed. (Patient taking differently: Apply 1 application  topically daily as needed (port access).) 60 g 0   LORazepam (ATIVAN) 0.5 MG tablet Take 1 tablet (0.5 mg total) by mouth every 8 (eight) hours as needed for anxiety. 30 tablet 0   montelukast (SINGULAIR) 10 MG tablet Take 1 tablet (10 mg total) by mouth daily. (Patient taking differently: Take 10 mg by mouth daily as needed (allergies).) 90 tablet 0   nystatin (MYCOSTATIN) 100000 UNIT/ML suspension Take 5 mLs (500,000 Units total) by mouth 4 (four) times daily. 60 mL 0   nystatin-triamcinolone ointment (MYCOLOG) Apply 1  Application topically 2 (two) times daily. 30 g 0   oxyCODONE (OXY IR/ROXICODONE) 5 MG immediate release tablet Take 1 tablet (5 mg total) by mouth every 6 (six) hours as needed for severe pain. 15 tablet 0   pantoprazole (PROTONIX) 40 MG tablet Take 1 tablet (40 mg total) by mouth daily. 30 tablet 1   potassium chloride SA (KLOR-CON M) 20 MEQ tablet Take 1 tablet (20 mEq total) by mouth daily. 30 tablet 0   prochlorperazine (COMPAZINE) 10 MG tablet Take 10 mg by mouth every 6 (six) hours as needed for nausea or vomiting.     rosuvastatin (CRESTOR) 40 MG tablet Take 1 tablet (40 mg total) by mouth daily. 90 tablet 3   No current facility-administered medications for this visit.   Facility-Administered Medications Ordered in Other Visits  Medication Dose Route Frequency Provider Last Rate Last Admin   heparin lock flush 100 unit/mL  500 Units Intracatheter Once Serena Croissant, MD       sodium chloride flush (NS) 0.9 % injection 10 mL  10 mL Intracatheter Once Serena Croissant, MD        PHYSICAL EXAMINATION: ECOG PERFORMANCE STATUS: 1 - Symptomatic but completely ambulatory  Vitals:   01/18/22 0910  BP: (!) 152/84  Pulse: 88  Resp: 18  Temp:  97.7 F (36.5 C)  SpO2: 100%   Filed Weights   01/18/22 0910  Weight: 297 lb 8 oz (134.9 kg)      LABORATORY DATA:  I have reviewed the data as listed    Latest Ref Rng & Units 01/04/2022    2:42 PM 12/28/2021    9:10 AM 12/20/2021    9:45 AM  CMP  Glucose 70 - 99 mg/dL 782  956  213   BUN 6 - 20 mg/dL 9  8  5    Creatinine 0.44 - 1.00 mg/dL 0.86  5.78  4.69   Sodium 135 - 145 mmol/L 141  143  144   Potassium 3.5 - 5.1 mmol/L 3.5  3.0  3.1   Chloride 98 - 111 mmol/L 105  103  106   CO2 22 - 32 mmol/L 31  32  33   Calcium 8.9 - 10.3 mg/dL 9.4  9.3  9.0   Total Protein 6.5 - 8.1 g/dL 6.6  6.9  6.4   Total Bilirubin 0.3 - 1.2 mg/dL 0.6  0.8  0.7   Alkaline Phos 38 - 126 U/L 61  67  66   AST 15 - 41 U/L 29  24  24    ALT 0 - 44 U/L 14  13   14      Lab Results  Component Value Date   WBC 4.4 01/18/2022   HGB 10.9 (L) 01/18/2022   HCT 33.9 (L) 01/18/2022   MCV 91.1 01/18/2022   PLT 224 01/18/2022   NEUTROABS 2.6 01/18/2022    ASSESSMENT & PLAN:  Malignant neoplasm of lower-outer quadrant of right breast of female, estrogen receptor positive (HCC) 05/03/2020:Screening mammogram showed a 1.0cm mass at the 6 o'clock position in the right breast. Diagnostic mammogram and US showed the 1.2cm mass at the 6 o'clock position in the right breast. Biopsy showed IDC, grade 3, HER-2 positive (3+), ER 15% weak, PR-, Ki67 80%.   06/02/2020:Right lumpectomy ([facility]): invasive and in situ ductal carcinoma, 1.3cm, clear margins, 1 right axillary lymph node negative for carcinoma. ER 15% weak, PR-,HER-2 positive (3+) Ki67 80%.   Treatment plan: 1.  Adjuvant chemotherapy with Taxol Herceptin followed by Herceptin maintenance.  Stopped after 11 cycles of Taxol Herceptin maintenance completed 07/07/2021  2.  Adjuvant radiation therapy 11/23/2020-12/19/2020 3.  Adjuvant antiestrogen therapy with tamoxifen started October 2022 4.  CT angiogram 07/13/2021: Right supraclavicular lymph node 5 cm, right internal mammary lymph nodes 1.8 cm 5.  Metastatic breast cancer: June 2023:Right supraclavicular lymphadenopathy: Biopsy: Metastatic poorly differentiated adenocarcinoma, ER +40%, PR negative, HER2 positive 3+ PET CT scan 08/06/21: Ext Right Supra clav LN along with Internal mammary and Rt Upper Mediastinal and Rt Axillary LN -----------------------------------------------------------------------------------------------------  Current treatment: Enhertu started on 08/21/2021, (this treatment on hold) today is cycle 7 Enhertu toxicities: 1,.  Severe nausea, requiring IV fluids: Added Emend to the regimen 2. severe diarrhea : Also resolved 3.  Severe fatigue: Able to walk without assistance  4.  Chemotherapy-induced anemia: Monitoring closely, hemoglobin  11.4 5.  Hospitalization 11/26/2021-11/28/2021: Dizziness and fatigue, esophageal dysmotility   We reduced the dosage of Enhertu for cycle 6. Added Emend Because of her ongoing issues with hypokalemia and diarrhea: We will add IV fluids with potassium on Saturday and next Thursday.   Return to clinic every 3 weeks for Enhertu    Orders Placed This Encounter  Procedures   CT CHEST ABDOMEN PELVIS W CONTRAST    Standing Status:   Future  Standing Expiration Date:   01/19/2023    Order Specific Question:   If indicated for the ordered procedure, I authorize the administration of contrast media per Radiology protocol    Answer:   Yes    Order Specific Question:   Does the patient have a contrast media/X-ray dye allergy?    Answer:   No    Order Specific Question:   Is patient pregnant?    Answer:   No    Order Specific Question:   Preferred imaging location?    Answer:   [facility]    Order Specific Question:   Is Oral Contrast requested for this exam?    Answer:   Yes, Per Radiology protocol   The patient has a good understanding of the overall plan. she agrees with it. she will call with any problems that may develop before the next visit here. Total time spent: 30 mins including face to face time and time spent for planning, charting and co-ordination of care   Tamsen Meek, MD 01/18/22    I Janan Ridge am scribing for Dr. Pamelia Hoit  I have reviewed the above documentation for accuracy and completeness, and I agree with the above.

## 2022-01-15 DIAGNOSIS — R051 Acute cough: Secondary | ICD-10-CM | POA: Insufficient documentation

## 2022-01-17 MED FILL — Fosaprepitant Dimeglumine For IV Infusion 150 MG (Base Eq): INTRAVENOUS | Qty: 5 | Status: AC

## 2022-01-18 ENCOUNTER — Inpatient Hospital Stay (HOSPITAL_BASED_OUTPATIENT_CLINIC_OR_DEPARTMENT_OTHER): Payer: 59 | Admitting: Hematology and Oncology

## 2022-01-18 ENCOUNTER — Inpatient Hospital Stay: Payer: 59

## 2022-01-18 ENCOUNTER — Other Ambulatory Visit: Payer: Self-pay

## 2022-01-18 VITALS — BP 152/84 | HR 88 | Temp 97.7°F | Resp 18 | Ht 69.0 in | Wt 297.5 lb

## 2022-01-18 VITALS — BP 130/72 | HR 67 | Resp 18

## 2022-01-18 DIAGNOSIS — C50511 Malignant neoplasm of lower-outer quadrant of right female breast: Secondary | ICD-10-CM

## 2022-01-18 DIAGNOSIS — C50919 Malignant neoplasm of unspecified site of unspecified female breast: Secondary | ICD-10-CM

## 2022-01-18 DIAGNOSIS — R110 Nausea: Secondary | ICD-10-CM

## 2022-01-18 DIAGNOSIS — Z170 Estrogen receptor positive status [ER+]: Secondary | ICD-10-CM

## 2022-01-18 DIAGNOSIS — Z95828 Presence of other vascular implants and grafts: Secondary | ICD-10-CM

## 2022-01-18 DIAGNOSIS — Z5112 Encounter for antineoplastic immunotherapy: Secondary | ICD-10-CM | POA: Diagnosis not present

## 2022-01-18 LAB — CMP (CANCER CENTER ONLY)
ALT: 10 U/L (ref 0–44)
AST: 20 U/L (ref 15–41)
Albumin: 3.9 g/dL (ref 3.5–5.0)
Alkaline Phosphatase: 65 U/L (ref 38–126)
Anion gap: 6 (ref 5–15)
BUN: 11 mg/dL (ref 6–20)
CO2: 32 mmol/L (ref 22–32)
Calcium: 9.3 mg/dL (ref 8.9–10.3)
Chloride: 106 mmol/L (ref 98–111)
Creatinine: 0.79 mg/dL (ref 0.44–1.00)
GFR, Estimated: >60 mL/min (ref >60)
Glucose, Bld: 95 mg/dL (ref 70–99)
Potassium: 3.2 mmol/L — ABNORMAL LOW (ref 3.5–5.1)
Sodium: 144 mmol/L (ref 135–145)
Total Bilirubin: 0.6 mg/dL (ref 0.3–1.2)
Total Protein: 6.9 g/dL (ref 6.5–8.1)

## 2022-01-18 LAB — CBC WITH DIFFERENTIAL (CANCER CENTER ONLY)
Abs Immature Granulocytes: 0.01 10*3/uL (ref 0.00–0.07)
Basophils Absolute: 0 10*3/uL (ref 0.0–0.1)
Basophils Relative: 1 %
Eosinophils Absolute: 0.1 10*3/uL (ref 0.0–0.5)
Eosinophils Relative: 3 %
HCT: 33.9 % — ABNORMAL LOW (ref 36.0–46.0)
Hemoglobin: 10.9 g/dL — ABNORMAL LOW (ref 12.0–15.0)
Immature Granulocytes: 0 %
Lymphocytes Relative: 22 %
Lymphs Abs: 1 10*3/uL (ref 0.7–4.0)
MCH: 29.3 pg (ref 26.0–34.0)
MCHC: 32.2 g/dL (ref 30.0–36.0)
MCV: 91.1 fL (ref 80.0–100.0)
Monocytes Absolute: 0.6 10*3/uL (ref 0.1–1.0)
Monocytes Relative: 14 %
Neutro Abs: 2.6 10*3/uL (ref 1.7–7.7)
Neutrophils Relative %: 60 %
Platelet Count: 224 10*3/uL (ref 150–400)
RBC: 3.72 MIL/uL — ABNORMAL LOW (ref 3.87–5.11)
RDW: 14.6 % (ref 11.5–15.5)
WBC Count: 4.4 10*3/uL (ref 4.0–10.5)
nRBC: 0 % (ref 0.0–0.2)

## 2022-01-18 MED ORDER — PALONOSETRON HCL INJECTION 0.25 MG/5ML
0.2500 mg | Freq: Once | INTRAVENOUS | Status: AC
  Administered 2022-01-18: 0.25 mg via INTRAVENOUS
  Filled 2022-01-18: qty 5

## 2022-01-18 MED ORDER — SODIUM CHLORIDE 0.9 % IV SOLN
150.0000 mg | Freq: Once | INTRAVENOUS | Status: AC
  Administered 2022-01-18: 150 mg via INTRAVENOUS
  Filled 2022-01-18: qty 150

## 2022-01-18 MED ORDER — HEPARIN SOD (PORK) LOCK FLUSH 100 UNIT/ML IV SOLN
500.0000 [IU] | Freq: Once | INTRAVENOUS | Status: AC | PRN
  Administered 2022-01-18: 500 [IU]

## 2022-01-18 MED ORDER — SODIUM CHLORIDE 0.9% FLUSH
10.0000 mL | INTRAVENOUS | Status: DC | PRN
  Administered 2022-01-18: 10 mL

## 2022-01-18 MED ORDER — DEXTROSE 5 % IV SOLN: Freq: Once | INTRAVENOUS | Status: AC

## 2022-01-18 MED ORDER — POTASSIUM CHLORIDE 10 MEQ/100ML IV SOLN
10.0000 meq | Freq: Once | INTRAVENOUS | Status: AC
  Administered 2022-01-18: 10 meq via INTRAVENOUS
  Filled 2022-01-18: qty 100

## 2022-01-18 MED ORDER — SODIUM CHLORIDE 0.9 % IV SOLN
150.0000 mg | Freq: Once | INTRAVENOUS | Status: DC
  Filled 2022-01-18: qty 5

## 2022-01-18 MED ORDER — SODIUM CHLORIDE 0.9% FLUSH
10.0000 mL | Freq: Once | INTRAVENOUS | Status: AC
  Administered 2022-01-18: 10 mL

## 2022-01-18 MED ORDER — CYANOCOBALAMIN 1000 MCG/ML IJ SOLN: 1000.0000 ug | Freq: Once | INTRAMUSCULAR | Status: DC

## 2022-01-18 MED ORDER — SODIUM CHLORIDE 0.9% FLUSH: 10.0000 mL | Freq: Once | INTRAVENOUS | Status: DC

## 2022-01-18 MED ORDER — FAM-TRASTUZUMAB DERUXTECAN-NXKI CHEMO 100 MG IV SOLR
400.0000 mg | Freq: Once | INTRAVENOUS | Status: AC
  Administered 2022-01-18: 400 mg via INTRAVENOUS
  Filled 2022-01-18: qty 20

## 2022-01-18 MED ORDER — ACETAMINOPHEN 325 MG PO TABS
650.0000 mg | ORAL_TABLET | Freq: Once | ORAL | Status: AC
  Administered 2022-01-18: 650 mg via ORAL
  Filled 2022-01-18: qty 2

## 2022-01-18 MED ORDER — SODIUM CHLORIDE 0.9 % IV SOLN: INTRAVENOUS | Status: DC

## 2022-01-18 NOTE — Assessment & Plan Note (Signed)
05/03/2020:Screening mammogram showed a 1.0cm mass at the 6 o'clock position in the right breast. Diagnostic mammogram and US showed the 1.2cm mass at the 6 o'clock position in the right breast. Biopsy showed IDC, grade 3, HER-2 positive (3+), ER 15% weak, PR-, Ki67 80%.   06/02/2020:Right lumpectomy ([facility]): invasive and in situ ductal carcinoma, 1.3cm, clear margins, 1 right axillary lymph node negative for carcinoma. ER 15% weak, PR-,HER-2 positive (3+) Ki67 80%.   Treatment plan: 1.  Adjuvant chemotherapy with Taxol Herceptin followed by Herceptin maintenance.  Stopped after 11 cycles of Taxol Herceptin maintenance completed 07/07/2021  2.  Adjuvant radiation therapy 11/23/2020-12/19/2020 3.  Adjuvant antiestrogen therapy with tamoxifen started October 2022 4.  CT angiogram 07/13/2021: Right supraclavicular lymph node 5 cm, right internal mammary lymph nodes 1.8 cm 5.  Metastatic breast cancer: June 2023:Right supraclavicular lymphadenopathy: Biopsy: Metastatic poorly differentiated adenocarcinoma, ER +40%, PR negative, HER2 positive 3+ PET CT scan 08/06/21: Ext Right Supra clav LN along with Internal mammary and Rt Upper Mediastinal and Rt Axillary LN -----------------------------------------------------------------------------------------------------  Current treatment: Enhertu started on 08/21/2021, (this treatment on hold) today is cycle 7 Enhertu toxicities: 1,.  Severe nausea, requiring IV fluids: Added Emend to the regimen 2. severe diarrhea : Also resolved 3.  Severe fatigue: Able to walk without assistance  4.  Chemotherapy-induced anemia: Monitoring closely, hemoglobin 11.4 5.  Hospitalization 11/26/2021-11/28/2021: Dizziness and fatigue, esophageal dysmotility   We reduced the dosage of Enhertu for cycle 6. Added Emend Because of her ongoing issues with hypokalemia and diarrhea: We will add IV fluids with potassium on Saturday and next Thursday.   Return to clinic every 3 weeks for  [facility]

## 2022-01-20 ENCOUNTER — Inpatient Hospital Stay: Payer: 59

## 2022-01-20 VITALS — BP 141/73 | HR 73 | Temp 98.0°F | Resp 16

## 2022-01-20 DIAGNOSIS — R110 Nausea: Secondary | ICD-10-CM

## 2022-01-20 DIAGNOSIS — Z5112 Encounter for antineoplastic immunotherapy: Secondary | ICD-10-CM | POA: Diagnosis not present

## 2022-01-20 DIAGNOSIS — Z170 Estrogen receptor positive status [ER+]: Secondary | ICD-10-CM

## 2022-01-20 DIAGNOSIS — Z95828 Presence of other vascular implants and grafts: Secondary | ICD-10-CM

## 2022-01-20 DIAGNOSIS — C50919 Malignant neoplasm of unspecified site of unspecified female breast: Secondary | ICD-10-CM

## 2022-01-20 MED ORDER — POTASSIUM CHLORIDE 10 MEQ/100ML IV SOLN
10.0000 meq | INTRAVENOUS | Status: AC
  Administered 2022-01-20 (×2): 10 meq via INTRAVENOUS
  Filled 2022-01-20 (×2): qty 100

## 2022-01-20 MED ORDER — HEPARIN SOD (PORK) LOCK FLUSH 100 UNIT/ML IV SOLN
500.0000 [IU] | Freq: Once | INTRAVENOUS | Status: AC
  Administered 2022-01-20: 500 [IU]

## 2022-01-20 MED ORDER — SODIUM CHLORIDE 0.9 % IV SOLN: INTRAVENOUS | Status: AC

## 2022-01-20 MED ORDER — SODIUM CHLORIDE 0.9% FLUSH
10.0000 mL | Freq: Once | INTRAVENOUS | Status: AC
  Administered 2022-01-20: 10 mL

## 2022-01-20 NOTE — Patient Instructions (Addendum)
Hypokalemia Hypokalemia means that the amount of potassium in the blood is lower than normal. Potassium is a mineral (electrolyte) that helps regulate the amount of fluid in the body. It also stimulates muscle tightening (contraction) and helps nerves work properly. Normally, most of the body's potassium is inside cells, and only a very small amount is in the blood. Because the amount in the blood is so small, minor changes to potassium levels in the blood can be life-threatening. What are the causes? This condition may be caused by: Antibiotic medicine. Diarrhea or vomiting. Taking too much of a medicine that helps you have a bowel movement (laxative) can cause diarrhea and lead to hypokalemia. Chronic kidney disease (CKD). Medicines that help the body get rid of excess fluid (diuretics). Eating disorders, such as anorexia or bulimia. Low magnesium levels in the body. Sweating a lot. What are the signs or symptoms? Symptoms of this condition include: Weakness. Constipation. Fatigue. Muscle cramps. Mental confusion. Skipped heartbeats or irregular heartbeat (palpitations). Tingling or numbness. How is this diagnosed? This condition is diagnosed with a blood test. How is this treated? This condition may be treated by: Taking potassium supplements. Adjusting the medicines that you take. Eating more foods that contain a lot of potassium. If your potassium level is very low, you may need to get potassium through an IV and be monitored in the hospital. Follow these instructions at home: Eating and drinking  Eat a healthy diet. A healthy diet includes fresh fruits and vegetables, whole grains, healthy fats, and lean proteins. If told, eat more foods that contain a lot of potassium. These include: Nuts, such as peanuts and pistachios. Seeds, such as sunflower seeds and pumpkin seeds. Peas, lentils, and lima beans. Whole grain and bran cereals and breads. Fresh fruits and vegetables,  such as apricots, avocado, bananas, cantaloupe, kiwi, oranges, tomatoes, asparagus, and potatoes. Juices, such as orange, tomato, and prune. Lean meats, including fish. Milk and milk products, such as yogurt. General instructions Take over-the-counter and prescription medicines only as told by your health care provider. This includes vitamins, natural food products, and supplements. Keep all follow-up visits. This is important. Contact a health care provider if: You have weakness that gets worse. You feel your heart pounding or racing. You vomit. You have diarrhea. You have diabetes and you have trouble keeping your blood sugar in your target range. Get help right away if: You have chest pain. You have shortness of breath. You have vomiting or diarrhea that lasts for more than 2 days. You faint. These symptoms may be an emergency. Get help right away. Call 911. Do not wait to see if the symptoms will go away. Do not drive yourself to the hospital. Summary Hypokalemia means that the amount of potassium in the blood is lower than normal. This condition is diagnosed with a blood test. Hypokalemia may be treated by taking potassium supplements, adjusting the medicines that you take, or eating more foods that are high in potassium. If your potassium level is very low, you may need to get potassium through an IV and be monitored in the hospital. This information is not intended to replace advice given to you by your health care provider. Make sure you discuss any questions you have with your health care provider. Document Revised: 11/03/2020 Document Reviewed: 11/03/2020 Elsevier Patient Education  [place].  Rehydration, Adult Rehydration is the replacement of fluids, salts, and minerals in the body (electrolytes) that are lost during dehydration. Dehydration is when there  is not enough water or other fluids in the body. This happens when you lose more fluids than you take in.  Common causes of dehydration include: Not drinking enough fluids. This can occur when you are ill or doing activities that require a lot of energy, especially in hot weather. Conditions that cause loss of water or other fluids. These include diarrhea, vomiting, sweating, and urinating a lot. Other illnesses, such as fever or infection. Certain medicines, such as those that remove excess fluid from the body (diuretics). Symptoms of mild or moderate dehydration may include thirst, dry lips and mouth, and dizziness. Symptoms of severe dehydration may include increased heart rate, confusion, fainting, and not urinating. In severe cases, you may need to get fluids through an IV at the hospital. For mild or moderate cases, you can usually rehydrate at home by drinking certain fluids as told by your health care provider. What are the risks? Your health care provider will talk with you about risks. Your health care provider will talk with you about risks. This may include taking in too much fluid (overhydration). This is rare. Overhydration can cause an imbalance of electrolytes in the body, kidney failure, or a decrease in salt (sodium) levels in the body. Supplies needed: You will need an oral rehydration solution (ORS) if your health care provider tells you to use one. This is a drink to treat dehydration. It can be found in pharmacies and retail stores. How to rehydrate Fluids Follow instructions from your health care provider about what to drink. The kind of fluid and the amount you should drink depend on your condition. In general, you should choose drinks that you prefer. If told by your health care provider, drink an ORS. Make an ORS by following instructions on the package. Start by drinking small amounts, about  cup (120 mL) every 5-10 minutes. Slowly increase how much you drink until you have taken in the amount recommended by your health care provider. Drink enough clear fluids to keep your  urine pale yellow. If you were told to drink an ORS, finish it first, then start slowly drinking other clear fluids. Drink fluids such as: Water. This includes sparkling and flavored water. Drinking only water can lead to having too little sodium in your body (hyponatremia). Follow the advice of your health care provider. Water from ice chips you suck on. Fruit juice with water added to it (diluted). Sports drinks. Hot or cold herbal teas. Broth-based soups. Milk or milk products. Food Follow instructions from your health care provider about what to eat while you rehydrate. Your health care provider may recommend that you slowly begin eating regular foods in small amounts. Eat foods that contain a healthy balance of electrolytes, such as bananas, oranges, potatoes, tomatoes, and spinach. Avoid foods that are greasy or contain a lot of sugar. In some cases, you may get nutrition through a feeding tube that is passed through your nose and into your stomach (nasogastric tube, or NG tube). This may be done if you have uncontrolled vomiting or diarrhea. Drinks to avoid  Certain drinks may make dehydration worse. While you rehydrate, avoid drinking alcohol. How to tell if you are recovering from dehydration You may be getting better if: You are urinating more often than before you started rehydrating. Your urine is pale yellow. Your energy level improves. You vomit less often. You have diarrhea less often. Your appetite improves or returns to normal. You feel less dizzy or light-headed. Your skin tone and  color start to look more normal. Follow these instructions at home: Take over-the-counter and prescription medicines only as told by your health care provider. Do not take sodium tablets. Doing this can lead to having too much sodium in your body (hypernatremia). Contact a health care provider if: You continue to have symptoms of mild or moderate dehydration, such as: Thirst. Dry  lips. Slightly dry mouth. Dizziness. Dark urine or less urine than normal. Muscle cramps. You continue to vomit or have diarrhea. Get help right away if: You have symptoms of dehydration that get worse. You have a fever. You have a severe headache. You have been vomiting and have problems, such as: Your vomiting gets worse or does not go away. Your vomit includes blood or green matter (bile). You cannot eat or drink without vomiting. You have problems with urination or bowel movements, such as: Diarrhea that gets worse or does not go away. Blood in your stool (feces). This may cause stool to look black and tarry. Not urinating, or urinating only a small amount of very dark urine, within 6-8 hours. You have trouble breathing. You have symptoms that get worse with treatment. These symptoms may be an emergency. Get help right away. Call 911. Do not wait to see if the symptoms will go away. Do not drive yourself to the hospital. This information is not intended to replace advice given to you by your health care provider. Make sure you discuss any questions you have with your health care provider. Document Revised: 07/03/2021 Document Reviewed: 07/03/2021 Elsevier Patient Education  [place].

## 2022-01-22 ENCOUNTER — Telehealth: Payer: Self-pay | Admitting: Hematology and Oncology

## 2022-01-22 NOTE — Telephone Encounter (Signed)
Scheduled appointment per 11/16 los. Patient is aware.  

## 2022-02-03 NOTE — Progress Notes (Signed)
Patient Care Team: [name], MD as PCP - General (Internal Medicine) Mcarthur Rossetti, MD as Consulting Physician (Orthopedic Surgery) Mauro Kaufmann, RN as Oncology Nurse Navigator Rockwell Germany, RN as Oncology Nurse Navigator [name], MD as Consulting Physician (General Surgery) Nicholas Lose, MD as Consulting Physician (Hematology and Oncology) Eppie Gibson, MD as Attending Physician (Radiation Oncology)  DIAGNOSIS: No diagnosis found.  SUMMARY OF ONCOLOGIC HISTORY: Oncology History  Malignant neoplasm of lower-outer quadrant of right breast of female, estrogen receptor positive ([place])  05/03/2020 Initial Diagnosis   Screening mammogram showed a 1.0cm mass at the 6 o'clock position in the right breast. Diagnostic mammogram and US showed the 1.2cm mass at the 6 o'clock position in the right breast. Biopsy showed IDC, grade 3, HER-2 positive (3+), ER 15% weak, PR-, Ki67 80%.   05/11/2020 Cancer Staging   Staging form: Breast, AJCC 8th Edition - Clinical stage from 05/11/2020: Stage IA (cT1b, cN0, cM0, G3, ER+, PR-, HER2+) - Signed by Nicholas Lose, MD on 05/11/2020 Stage prefix: Initial diagnosis   05/19/2020 Genetic Testing   Negative hereditary cancer genetic testing: no pathogenic variants detected in [facility] Breast STAT Panel and Common Hereditary Cancers Panel.  The report dates are May 19, 2020 (STAT) and May 23, 2020 (Common Hereditary).   The Common Hereditary Cancers Panel offered by [facility] includes sequencing and/or deletion duplication testing of the following 47 genes: APC, ATM, AXIN2, BARD1, BMPR1A, BRCA1, BRCA2, BRIP1, CDH1, CDK4, CDKN2A (p14ARF), CDKN2A (p16INK4a), CHEK2, CTNNA1, DICER1, EPCAM (Deletion/duplication testing only), GREM1 (promoter region deletion/duplication testing only), GREM1, HOXB13, KIT, MEN1, MLH1, MSH2, MSH3, MSH6, MUTYH, NBN, NF1, NHTL1, PALB2, PDGFRA, PMS2, POLD1, POLE, PTEN, RAD50, RAD51C, RAD51D, SDHA, SDHB, SDHC, SDHD,  SMAD4, SMARCA4. STK11, TP53, TSC1, TSC2, and VHL.  The following genes were evaluated for sequence changes only: SDHA and HOXB13 c.251G>A variant only.es only: SDHA and HOXB13 c.251G>A variant only.   06/02/2020 Surgery   Right lumpectomy ([facility]): invasive and in situ ductal carcinoma, 1.3cm, clear margins, 1 right axillary lymph node negative for carcinoma.   06/02/2020 Cancer Staging   Staging form: Breast, AJCC 8th Edition - Pathologic stage from 06/02/2020: Stage IA (pT1c, pN0, cM0, G3, ER+, PR-, HER2+) - Signed by Gardenia Phlegm, NP on 06/15/2020 Stage prefix: Initial diagnosis Histologic grading system: 3 grade system   07/08/2020 - 10/21/2020 Chemotherapy   Taxol Herceptin followed by Herceptin maintenance   11/11/2020 -  Chemotherapy   Herceptin maintenance       04/14/2021 -  Anti-estrogen oral therapy   Tamoxifen 10 mg   08/21/2021 - 10/26/2021 Chemotherapy   Patient is on Treatment Plan : BREAST METASTATIC fam-trastuzumab deruxtecan-nxki (Enhertu) q21d     11/15/2021 -  Chemotherapy   Patient is on Treatment Plan : BREAST METASTATIC Fam-Trastuzumab Deruxtecan-nxki (Enhertu) (5.4) q21d       CHIEF COMPLIANT: Follow-up metastatic breast cancer on cycle 8 Enhertu   INTERVAL HISTORY: Pamela [name] is a 56 y.o. with the above-mentioned metastatic breast cancer currently on treatment with Enhertu. She presents to the clinic for a follow-up and treatment.     ALLERGIES:  is allergic to pollen extract, compazine [prochlorperazine], latex, codeine, diflucan [fluconazole], shellfish allergy, and betadine [povidone iodine].  MEDICATIONS:  Current Outpatient Medications  Medication Sig Dispense Refill   amLODipine (NORVASC) 10 MG tablet Take 1 tablet (10 mg total) by mouth daily. 90 tablet 2   amoxicillin (AMOXIL) 500 MG tablet Take 1 tablet (500 mg total) by mouth 2 (  two) times daily. 14 tablet 0   aspirin EC 81 MG tablet Take 1 tablet (81 mg total) by mouth daily.  Swallow whole. 30 tablet 11   Azilsartan-Chlorthalidone (EDARBYCLOR) 40-25 MG TABS Take 1 tablet by mouth daily. 90 tablet 2   benzonatate (TESSALON PERLES) 100 MG capsule Take 1 capsule (100 mg total) by mouth 3 (three) times daily as needed for cough. 30 capsule 1   dexamethasone (DECADRON) 4 MG tablet Take 1 tablet (4 mg total) by mouth 2 (two) times daily with a meal. Day 2,3,4 one tablet with lunch 30 tablet 1   diphenoxylate-atropine (LOMOTIL) 2.5-0.025 MG tablet Take 1 tablet by mouth 4 (four) times daily as needed for diarrhea or loose stools. 30 tablet 0   HYDROcodone bit-homatropine (HYDROMET) 5-1.5 MG/5ML syrup Take 5 mLs by mouth every 6 (six) hours as needed. 120 mL 0   levonorgestrel (MIRENA, 52 MG,) 20 MCG/DAY IUD 1 each by Intrauterine route once.     lidocaine-prilocaine (EMLA) cream Apply 1 application topically daily as needed. (Patient taking differently: Apply 1 application  topically daily as needed (port access).) 60 g 0   LORazepam (ATIVAN) 0.5 MG tablet Take 1 tablet (0.5 mg total) by mouth every 8 (eight) hours as needed for anxiety. 30 tablet 0   montelukast (SINGULAIR) 10 MG tablet Take 1 tablet (10 mg total) by mouth daily. (Patient taking differently: Take 10 mg by mouth daily as needed (allergies).) 90 tablet 0   nystatin (MYCOSTATIN) 100000 UNIT/ML suspension Take 5 mLs (500,000 Units total) by mouth 4 (four) times daily. 60 mL 0   nystatin-triamcinolone ointment (MYCOLOG) Apply 1 Application topically 2 (two) times daily. 30 g 0   oxyCODONE (OXY IR/ROXICODONE) 5 MG immediate release tablet Take 1 tablet (5 mg total) by mouth every 6 (six) hours as needed for severe pain. 15 tablet 0   pantoprazole (PROTONIX) 40 MG tablet Take 1 tablet (40 mg total) by mouth daily. 30 tablet 1   potassium chloride SA (KLOR-CON M) 20 MEQ tablet Take 1 tablet (20 mEq total) by mouth daily. 30 tablet 0   prochlorperazine (COMPAZINE) 10 MG tablet Take 10 mg by mouth every 6 (six) hours as  needed for nausea or vomiting.     rosuvastatin (CRESTOR) 40 MG tablet Take 1 tablet (40 mg total) by mouth daily. 90 tablet 3   No current facility-administered medications for this visit.   Facility-Administered Medications Ordered in Other Visits  Medication Dose Route Frequency Provider Last Rate Last Admin   heparin lock flush 100 unit/mL  500 Units Intracatheter Once Nicholas Lose, MD       sodium chloride flush (NS) 0.9 % injection 10 mL  10 mL Intracatheter Once Nicholas Lose, MD        PHYSICAL EXAMINATION: ECOG PERFORMANCE STATUS: {CHL ONC ECOG BH:[id number]}  There were no vitals filed for this visit. There were no vitals filed for this visit.  BREAST:*** No palpable masses or nodules in either right or left breasts. No palpable axillary supraclavicular or infraclavicular adenopathy no breast tenderness or nipple discharge. (exam performed in the presence of a chaperone)  LABORATORY DATA:  I have reviewed the data as listed    Latest Ref Rng & Units 01/18/2022    9:06 AM 01/04/2022    2:42 PM 12/28/2021    9:10 AM  CMP  Glucose 70 - 99 mg/dL 95  104  111   BUN 6 - 20 mg/dL 11  9  8  Creatinine 0.44 - 1.00 mg/dL 0.79  0.71  0.74   Sodium 135 - 145 mmol/L 144  141  143   Potassium 3.5 - 5.1 mmol/L 3.2  3.5  3.0   Chloride 98 - 111 mmol/L 106  105  103   CO2 22 - 32 mmol/L 32  31  32   Calcium 8.9 - 10.3 mg/dL 9.3  9.4  9.3   Total Protein 6.5 - 8.1 g/dL 6.9  6.6  6.9   Total Bilirubin 0.3 - 1.2 mg/dL 0.6  0.6  0.8   Alkaline Phos 38 - 126 U/L 65  61  67   AST 15 - 41 U/L _0 ALT 0 - 44 U/L _1 Lab Results  Component Value Date   WBC 4.4 01/18/2022   HGB 10.9 (L) 01/18/2022   HCT 33.9 (L) 01/18/2022   MCV 91.1 01/18/2022   PLT 224 01/18/2022   NEUTROABS 2.6 01/18/2022    ASSESSMENT & PLAN:  No problem-specific Assessment & Plan notes found for this encounter.    No orders of the defined types were placed in this encounter.  The  patient has a good understanding of the overall plan. she agrees with it. she will call with any problems that may develop before the next visit here. Total time spent: 30 mins including face to face time and time spent for planning, charting and co-ordination of care   Suzzette Righter, [place] 02/03/22    I Gardiner Coins am scribing for Dr. Lindi Adie  ***

## 2022-02-07 ENCOUNTER — Ambulatory Visit: Payer: 59

## 2022-02-07 ENCOUNTER — Other Ambulatory Visit: Payer: Self-pay | Admitting: Hematology and Oncology

## 2022-02-07 ENCOUNTER — Other Ambulatory Visit: Payer: 59

## 2022-02-07 ENCOUNTER — Ambulatory Visit: Payer: 59 | Admitting: Hematology and Oncology

## 2022-02-07 DIAGNOSIS — C50511 Malignant neoplasm of lower-outer quadrant of right female breast: Secondary | ICD-10-CM

## 2022-02-07 MED FILL — Fosaprepitant Dimeglumine For IV Infusion 150 MG (Base Eq): INTRAVENOUS | Qty: 5 | Status: AC

## 2022-02-08 ENCOUNTER — Other Ambulatory Visit: Payer: Self-pay

## 2022-02-08 ENCOUNTER — Inpatient Hospital Stay (HOSPITAL_BASED_OUTPATIENT_CLINIC_OR_DEPARTMENT_OTHER): Payer: 59 | Admitting: Hematology and Oncology

## 2022-02-08 ENCOUNTER — Inpatient Hospital Stay: Payer: 59

## 2022-02-08 ENCOUNTER — Inpatient Hospital Stay: Payer: 59 | Attending: Hematology and Oncology

## 2022-02-08 VITALS — BP 135/78 | HR 64 | Temp 97.8°F | Resp 18 | Ht 69.0 in | Wt 295.1 lb

## 2022-02-08 DIAGNOSIS — D6481 Anemia due to antineoplastic chemotherapy: Secondary | ICD-10-CM | POA: Insufficient documentation

## 2022-02-08 DIAGNOSIS — C50511 Malignant neoplasm of lower-outer quadrant of right female breast: Secondary | ICD-10-CM | POA: Insufficient documentation

## 2022-02-08 DIAGNOSIS — Z885 Allergy status to narcotic agent status: Secondary | ICD-10-CM | POA: Diagnosis not present

## 2022-02-08 DIAGNOSIS — Z5112 Encounter for antineoplastic immunotherapy: Secondary | ICD-10-CM | POA: Insufficient documentation

## 2022-02-08 DIAGNOSIS — Z888 Allergy status to other drugs, medicaments and biological substances status: Secondary | ICD-10-CM | POA: Insufficient documentation

## 2022-02-08 DIAGNOSIS — E876 Hypokalemia: Secondary | ICD-10-CM | POA: Insufficient documentation

## 2022-02-08 DIAGNOSIS — Z79899 Other long term (current) drug therapy: Secondary | ICD-10-CM | POA: Diagnosis not present

## 2022-02-08 DIAGNOSIS — Z883 Allergy status to other anti-infective agents status: Secondary | ICD-10-CM | POA: Insufficient documentation

## 2022-02-08 DIAGNOSIS — R197 Diarrhea, unspecified: Secondary | ICD-10-CM | POA: Insufficient documentation

## 2022-02-08 DIAGNOSIS — R110 Nausea: Secondary | ICD-10-CM | POA: Diagnosis not present

## 2022-02-08 DIAGNOSIS — Z170 Estrogen receptor positive status [ER+]: Secondary | ICD-10-CM | POA: Diagnosis not present

## 2022-02-08 DIAGNOSIS — T451X5A Adverse effect of antineoplastic and immunosuppressive drugs, initial encounter: Secondary | ICD-10-CM | POA: Diagnosis not present

## 2022-02-08 DIAGNOSIS — Z95828 Presence of other vascular implants and grafts: Secondary | ICD-10-CM

## 2022-02-08 DIAGNOSIS — N6315 Unspecified lump in the right breast, overlapping quadrants: Secondary | ICD-10-CM | POA: Insufficient documentation

## 2022-02-08 DIAGNOSIS — C50919 Malignant neoplasm of unspecified site of unspecified female breast: Secondary | ICD-10-CM

## 2022-02-08 DIAGNOSIS — Z793 Long term (current) use of hormonal contraceptives: Secondary | ICD-10-CM | POA: Diagnosis not present

## 2022-02-08 LAB — CBC WITH DIFFERENTIAL (CANCER CENTER ONLY)
Abs Immature Granulocytes: 0.01 10*3/uL (ref 0.00–0.07)
Basophils Absolute: 0 10*3/uL (ref 0.0–0.1)
Basophils Relative: 1 %
Eosinophils Absolute: 0.1 10*3/uL (ref 0.0–0.5)
Eosinophils Relative: 3 %
HCT: 34.7 % — ABNORMAL LOW (ref 36.0–46.0)
Hemoglobin: 11.2 g/dL — ABNORMAL LOW (ref 12.0–15.0)
Immature Granulocytes: 0 %
Lymphocytes Relative: 25 %
Lymphs Abs: 1 10*3/uL (ref 0.7–4.0)
MCH: 28.8 pg (ref 26.0–34.0)
MCHC: 32.3 g/dL (ref 30.0–36.0)
MCV: 89.2 fL (ref 80.0–100.0)
Monocytes Absolute: 0.6 10*3/uL (ref 0.1–1.0)
Monocytes Relative: 16 %
Neutro Abs: 2.2 10*3/uL (ref 1.7–7.7)
Neutrophils Relative %: 55 %
Platelet Count: 228 10*3/uL (ref 150–400)
RBC: 3.89 MIL/uL (ref 3.87–5.11)
RDW: 14.8 % (ref 11.5–15.5)
WBC Count: 4 10*3/uL (ref 4.0–10.5)
nRBC: 0 % (ref 0.0–0.2)

## 2022-02-08 LAB — CMP (CANCER CENTER ONLY)
ALT: 11 U/L (ref 0–44)
AST: 22 U/L (ref 15–41)
Albumin: 3.6 g/dL (ref 3.5–5.0)
Alkaline Phosphatase: 77 U/L (ref 38–126)
Anion gap: 6 (ref 5–15)
BUN: 5 mg/dL — ABNORMAL LOW (ref 6–20)
CO2: 31 mmol/L (ref 22–32)
Calcium: 9.5 mg/dL (ref 8.9–10.3)
Chloride: 106 mmol/L (ref 98–111)
Creatinine: 0.64 mg/dL (ref 0.44–1.00)
GFR, Estimated: >60 mL/min (ref >60)
Glucose, Bld: 102 mg/dL — ABNORMAL HIGH (ref 70–99)
Potassium: 3.1 mmol/L — ABNORMAL LOW (ref 3.5–5.1)
Sodium: 143 mmol/L (ref 135–145)
Total Bilirubin: 0.8 mg/dL (ref 0.3–1.2)
Total Protein: 6.6 g/dL (ref 6.5–8.1)

## 2022-02-08 MED ORDER — SODIUM CHLORIDE 0.9 % IV SOLN: Freq: Once | INTRAVENOUS | Status: AC

## 2022-02-08 MED ORDER — SODIUM CHLORIDE 0.9 % IV SOLN
150.0000 mg | Freq: Once | INTRAVENOUS | Status: AC
  Administered 2022-02-08: 150 mg via INTRAVENOUS
  Filled 2022-02-08: qty 150

## 2022-02-08 MED ORDER — PALONOSETRON HCL INJECTION 0.25 MG/5ML
0.2500 mg | Freq: Once | INTRAVENOUS | Status: AC
  Administered 2022-02-08: 0.25 mg via INTRAVENOUS
  Filled 2022-02-08: qty 5

## 2022-02-08 MED ORDER — SODIUM CHLORIDE 0.9% FLUSH
10.0000 mL | INTRAVENOUS | Status: DC | PRN
  Administered 2022-02-08: 10 mL

## 2022-02-08 MED ORDER — ACETAMINOPHEN 325 MG PO TABS
650.0000 mg | ORAL_TABLET | Freq: Once | ORAL | Status: AC
  Administered 2022-02-08: 650 mg via ORAL
  Filled 2022-02-08: qty 2

## 2022-02-08 MED ORDER — CYANOCOBALAMIN 1000 MCG/ML IJ SOLN
1000.0000 ug | Freq: Once | INTRAMUSCULAR | Status: AC
  Administered 2022-02-08: 1000 ug via INTRAMUSCULAR
  Filled 2022-02-08: qty 1

## 2022-02-08 MED ORDER — HEPARIN SOD (PORK) LOCK FLUSH 100 UNIT/ML IV SOLN
500.0000 [IU] | Freq: Once | INTRAVENOUS | Status: AC | PRN
  Administered 2022-02-08: 500 [IU]

## 2022-02-08 MED ORDER — DEXTROSE 5 % IV SOLN: Freq: Once | INTRAVENOUS | Status: AC

## 2022-02-08 MED ORDER — FAM-TRASTUZUMAB DERUXTECAN-NXKI CHEMO 100 MG IV SOLR
2.9200 mg/kg | Freq: Once | INTRAVENOUS | Status: AC
  Administered 2022-02-08: 400 mg via INTRAVENOUS
  Filled 2022-02-08: qty 20

## 2022-02-08 MED ORDER — POTASSIUM CHLORIDE 10 MEQ/100ML IV SOLN
10.0000 meq | Freq: Once | INTRAVENOUS | Status: AC
  Administered 2022-02-08: 10 meq via INTRAVENOUS
  Filled 2022-02-08: qty 100

## 2022-02-08 NOTE — Patient Instructions (Signed)
[facility] MEDICAL ONCOLOGY   Discharge Instructions: Thank you for choosing [facility] to provide your oncology and hematology care.   If you have a lab appointment with the Cancer Center, please go directly to the Cancer Center and check in at the registration area.   Wear comfortable clothing and clothing appropriate for easy access to any Portacath or PICC line.   We strive to give you quality time with your provider. You may need to reschedule your appointment if you arrive late (15 or more minutes).  Arriving late affects you and other patients whose appointments are after yours.  Also, if you miss three or more appointments without notifying the office, you may be dismissed from the clinic at the provider's discretion.      For prescription refill requests, have your pharmacy contact our office and allow 72 hours for refills to be completed.    Today you received the following chemotherapy and/or immunotherapy agents: fam-trastuzumab deruxtecan-nxki      To help prevent nausea and vomiting after your treatment, we encourage you to take your nausea medication as directed.  BELOW ARE SYMPTOMS THAT SHOULD BE REPORTED IMMEDIATELY: *FEVER GREATER THAN 100.4 F (38 C) OR HIGHER *CHILLS OR SWEATING *NAUSEA AND VOMITING THAT IS NOT CONTROLLED WITH YOUR NAUSEA MEDICATION *UNUSUAL SHORTNESS OF BREATH *UNUSUAL BRUISING OR BLEEDING *URINARY PROBLEMS (pain or burning when urinating, or frequent urination) *BOWEL PROBLEMS (unusual diarrhea, constipation, pain near the anus) TENDERNESS IN MOUTH AND THROAT WITH OR WITHOUT PRESENCE OF ULCERS (sore throat, sores in mouth, or a toothache) UNUSUAL RASH, SWELLING OR PAIN  UNUSUAL VAGINAL DISCHARGE OR ITCHING   Items with * indicate a potential emergency and should be followed up as soon as possible or go to the Emergency Department if any problems should occur.  Please show the CHEMOTHERAPY ALERT CARD or IMMUNOTHERAPY  ALERT CARD at check-in to the Emergency Department and triage nurse.  Should you have questions after your visit or need to cancel or reschedule your appointment, please contact [facility]COL[facility]  Dept: [phone]  and follow the prompts.  Office hours are 8:00 a.m. to 4:30 p.m. Monday - Friday. Please note that voicemails left after 4:00 p.m. may not be returned until the following business day.  We are closed weekends and major holidays. You have access to a nurse at all times for urgent questions. Please call the main number to the clinic Dept: [phone] and follow the prompts.   For any non-urgent questions, you may also contact your provider using MyChart. We now offer e-Visits for anyone 18 and older to request care online for non-urgent symptoms. For details visit [email or web address].   Also download the MyChart app! Go to the app store, search "MyChart", open the app, select [facility], and log in with your MyChart username and password.  Masks are optional in the cancer centers. If you would like for your care team to wear a mask while they are taking care of you, please let them know. You may have one support person who is at least 55 years old accompany you for your appointments. 

## 2022-02-08 NOTE — Assessment & Plan Note (Signed)
05/03/2020:Screening mammogram showed a 1.0cm mass at the 6 o'clock position in the right breast. Diagnostic mammogram and US showed the 1.2cm mass at the 6 o'clock position in the right breast. Biopsy showed IDC, grade 3, HER-2 positive (3+), ER 15% weak, PR-, Ki67 80%.   06/02/2020:Right lumpectomy ([facility]): invasive and in situ ductal carcinoma, 1.3cm, clear margins, 1 right axillary lymph node negative for carcinoma. ER 15% weak, PR-,HER-2 positive (3+) Ki67 80%.   Treatment plan: 1.  Adjuvant chemotherapy with Taxol Herceptin followed by Herceptin maintenance.  Stopped after 11 cycles of Taxol Herceptin maintenance completed 07/07/2021  2.  Adjuvant radiation therapy 11/23/2020-12/19/2020 3.  Adjuvant antiestrogen therapy with tamoxifen started October 2022 4.  CT angiogram 07/13/2021: Right supraclavicular lymph node 5 cm, right internal mammary lymph nodes 1.8 cm 5.  Metastatic breast cancer: June 2023:Right supraclavicular lymphadenopathy: Biopsy: Metastatic poorly differentiated adenocarcinoma, ER +40%, PR negative, HER2 positive 3+ PET CT scan 08/06/21: Ext Right Supra clav LN along with Internal mammary and Rt Upper Mediastinal and Rt Axillary LN -----------------------------------------------------------------------------------------------------  Current treatment: Enhertu started on 08/21/2021, (this treatment on hold) today is cycle 9 Enhertu toxicities: 1,.  Severe nausea, requiring IV fluids: Added Emend to the regimen 2. severe diarrhea : Also resolved 3.  Severe fatigue: Able to walk without assistance  4.  Chemotherapy-induced anemia: Monitoring closely, hemoglobin 11.4 5.  Hospitalization 11/26/2021-11/28/2021: Dizziness and fatigue, esophageal dysmotility   We reduced the dosage of Enhertu for cycle 6. Added Emend Because of her ongoing issues with hypokalemia and diarrhea: We will add IV fluids with potassium on Saturday and next Thursday.   Return to clinic every 3 weeks for  [facility]

## 2022-02-10 ENCOUNTER — Inpatient Hospital Stay: Payer: 59

## 2022-02-10 VITALS — BP 146/68 | HR 53 | Temp 97.0°F | Resp 20

## 2022-02-10 DIAGNOSIS — Z95828 Presence of other vascular implants and grafts: Secondary | ICD-10-CM

## 2022-02-10 DIAGNOSIS — R110 Nausea: Secondary | ICD-10-CM

## 2022-02-10 DIAGNOSIS — Z170 Estrogen receptor positive status [ER+]: Secondary | ICD-10-CM

## 2022-02-10 DIAGNOSIS — Z5112 Encounter for antineoplastic immunotherapy: Secondary | ICD-10-CM | POA: Diagnosis not present

## 2022-02-10 DIAGNOSIS — C50919 Malignant neoplasm of unspecified site of unspecified female breast: Secondary | ICD-10-CM

## 2022-02-10 MED ORDER — SODIUM CHLORIDE 0.9% FLUSH
10.0000 mL | Freq: Once | INTRAVENOUS | Status: AC
  Administered 2022-02-10: 10 mL

## 2022-02-10 MED ORDER — SODIUM CHLORIDE 0.9 % IV SOLN: INTRAVENOUS | Status: AC

## 2022-02-10 MED ORDER — HEPARIN SOD (PORK) LOCK FLUSH 100 UNIT/ML IV SOLN
500.0000 [IU] | Freq: Once | INTRAVENOUS | Status: AC
  Administered 2022-02-10: 500 [IU]

## 2022-02-10 NOTE — Patient Instructions (Signed)

## 2022-02-28 ENCOUNTER — Other Ambulatory Visit: Payer: Self-pay | Admitting: Internal Medicine

## 2022-02-28 DIAGNOSIS — I10 Essential (primary) hypertension: Secondary | ICD-10-CM

## 2022-03-01 ENCOUNTER — Telehealth (HOSPITAL_COMMUNITY): Payer: Self-pay | Admitting: *Deleted

## 2022-03-01 ENCOUNTER — Other Ambulatory Visit: Payer: 59

## 2022-03-01 ENCOUNTER — Ambulatory Visit: Payer: 59

## 2022-03-01 ENCOUNTER — Ambulatory Visit: Payer: 59 | Admitting: Hematology and Oncology

## 2022-03-03 ENCOUNTER — Ambulatory Visit: Payer: 59

## 2022-03-05 NOTE — Progress Notes (Signed)
Patient Care Team: [name], MD as PCP - General (Internal Medicine) Mcarthur Rossetti, MD as Consulting Physician (Orthopedic Surgery) Mauro Kaufmann, RN as Oncology Nurse Navigator Rockwell Germany, RN as Oncology Nurse Navigator [name], MD as Consulting Physician (General Surgery) Nicholas Lose, MD as Consulting Physician (Hematology and Oncology) Eppie Gibson, MD as Attending Physician (Radiation Oncology)  DIAGNOSIS: No diagnosis found.  SUMMARY OF ONCOLOGIC HISTORY: Oncology History  Malignant neoplasm of lower-outer quadrant of right breast of female, estrogen receptor positive ([place])  05/03/2020 Initial Diagnosis   Screening mammogram showed a 1.0cm mass at the 6 o'clock position in the right breast. Diagnostic mammogram and US showed the 1.2cm mass at the 6 o'clock position in the right breast. Biopsy showed IDC, grade 3, HER-2 positive (3+), ER 15% weak, PR-, Ki67 80%.   05/11/2020 Cancer Staging   Staging form: Breast, AJCC 8th Edition - Clinical stage from 05/11/2020: Stage IA (cT1b, cN0, cM0, G3, ER+, PR-, HER2+) - Signed by Nicholas Lose, MD on 05/11/2020 Stage prefix: Initial diagnosis   05/19/2020 Genetic Testing   Negative hereditary cancer genetic testing: no pathogenic variants detected in [facility] Breast STAT Panel and Common Hereditary Cancers Panel.  The report dates are May 19, 2020 (STAT) and May 23, 2020 (Common Hereditary).   The Common Hereditary Cancers Panel offered by [facility] includes sequencing and/or deletion duplication testing of the following 47 genes: APC, ATM, AXIN2, BARD1, BMPR1A, BRCA1, BRCA2, BRIP1, CDH1, CDK4, CDKN2A (p14ARF), CDKN2A (p16INK4a), CHEK2, CTNNA1, DICER1, EPCAM (Deletion/duplication testing only), GREM1 (promoter region deletion/duplication testing only), GREM1, HOXB13, KIT, MEN1, MLH1, MSH2, MSH3, MSH6, MUTYH, NBN, NF1, NHTL1, PALB2, PDGFRA, PMS2, POLD1, POLE, PTEN, RAD50, RAD51C, RAD51D, SDHA, SDHB, SDHC, SDHD,  SMAD4, SMARCA4. STK11, TP53, TSC1, TSC2, and VHL.  The following genes were evaluated for sequence changes only: SDHA and HOXB13 c.251G>A variant only.es only: SDHA and HOXB13 c.251G>A variant only.   06/02/2020 Surgery   Right lumpectomy ([facility]): invasive and in situ ductal carcinoma, 1.3cm, clear margins, 1 right axillary lymph node negative for carcinoma.   06/02/2020 Cancer Staging   Staging form: Breast, AJCC 8th Edition - Pathologic stage from 06/02/2020: Stage IA (pT1c, pN0, cM0, G3, ER+, PR-, HER2+) - Signed by Gardenia Phlegm, NP on 06/15/2020 Stage prefix: Initial diagnosis Histologic grading system: 3 grade system   07/08/2020 - 10/21/2020 Chemotherapy   Taxol Herceptin followed by Herceptin maintenance   11/11/2020 -  Chemotherapy   Herceptin maintenance       04/14/2021 -  Anti-estrogen oral therapy   Tamoxifen 10 mg   08/21/2021 - 10/26/2021 Chemotherapy   Patient is on Treatment Plan : BREAST METASTATIC fam-trastuzumab deruxtecan-nxki (Enhertu) q21d     11/15/2021 -  Chemotherapy   Patient is on Treatment Plan : BREAST METASTATIC Fam-Trastuzumab Deruxtecan-nxki (Enhertu) (5.4) q21d       CHIEF COMPLIANT:  Follow-up metastatic breast cancer    INTERVAL HISTORY: Pamela [name] is a  57 y.o. with the above-mentioned metastatic breast cancer currently on treatment with Enhertu. She presents to the clinic for a follow-up and treatment.     ALLERGIES:  is allergic to pollen extract, compazine [prochlorperazine], latex, codeine, diflucan [fluconazole], shellfish allergy, and betadine [povidone iodine].  MEDICATIONS:  Current Outpatient Medications  Medication Sig Dispense Refill   amLODipine (NORVASC) 10 MG tablet Take 1 tablet (10 mg total) by mouth daily. 90 tablet 2   amoxicillin (AMOXIL) 500 MG tablet Take 1 tablet (500 mg total) by mouth 2 (two)  times daily. 14 tablet 0   aspirin EC 81 MG tablet Take 1 tablet (81 mg total) by mouth daily. Swallow whole. 30  tablet 11   benzonatate (TESSALON PERLES) 100 MG capsule Take 1 capsule (100 mg total) by mouth 3 (three) times daily as needed for cough. 30 capsule 1   dexamethasone (DECADRON) 4 MG tablet Take 1 tablet (4 mg total) by mouth 2 (two) times daily with a meal. Day 2,3,4 one tablet with lunch 30 tablet 1   diphenoxylate-atropine (LOMOTIL) 2.5-0.025 MG tablet Take 1 tablet by mouth 4 (four) times daily as needed for diarrhea or loose stools. 30 tablet 0   EDARBYCLOR 40-25 MG TABS Take 1 tablet by mouth daily. 90 tablet 2   HYDROcodone bit-homatropine (HYDROMET) 5-1.5 MG/5ML syrup Take 5 mLs by mouth every 6 (six) hours as needed. 120 mL 0   levonorgestrel (MIRENA, 52 MG,) 20 MCG/DAY IUD 1 each by Intrauterine route once.     lidocaine-prilocaine (EMLA) cream Apply 1 application topically daily as needed. (Patient taking differently: Apply 1 application  topically daily as needed (port access).) 60 g 0   LORazepam (ATIVAN) 0.5 MG tablet Take 1 tablet (0.5 mg total) by mouth every 8 (eight) hours as needed for anxiety. 30 tablet 0   montelukast (SINGULAIR) 10 MG tablet Take 1 tablet (10 mg total) by mouth daily. (Patient taking differently: Take 10 mg by mouth daily as needed (allergies).) 90 tablet 0   nystatin (MYCOSTATIN) 100000 UNIT/ML suspension Take 5 mLs (500,000 Units total) by mouth 4 (four) times daily. 60 mL 0   nystatin-triamcinolone ointment (MYCOLOG) Apply 1 Application topically 2 (two) times daily. 30 g 0   oxyCODONE (OXY IR/ROXICODONE) 5 MG immediate release tablet Take 1 tablet (5 mg total) by mouth every 6 (six) hours as needed for severe pain. 15 tablet 0   pantoprazole (PROTONIX) 40 MG tablet Take 1 tablet (40 mg total) by mouth daily. 30 tablet 1   potassium chloride SA (KLOR-CON M) 20 MEQ tablet Take 1 tablet (20 mEq total) by mouth daily. 30 tablet 0   prochlorperazine (COMPAZINE) 10 MG tablet Take 1 tablet (10 mg total) by mouth every 6 (six) hours as needed (Nausea or vomiting).  30 tablet 1   rosuvastatin (CRESTOR) 40 MG tablet Take 1 tablet (40 mg total) by mouth daily. 90 tablet 3   No current facility-administered medications for this visit.   Facility-Administered Medications Ordered in Other Visits  Medication Dose Route Frequency Provider Last Rate Last Admin   heparin lock flush 100 unit/mL  500 Units Intracatheter Once Nicholas Lose, MD       sodium chloride flush (NS) 0.9 % injection 10 mL  10 mL Intracatheter Once Nicholas Lose, MD        PHYSICAL EXAMINATION: ECOG PERFORMANCE STATUS: {CHL ONC ECOG WV:[id number]}  There were no vitals filed for this visit. There were no vitals filed for this visit.  BREAST:*** No palpable masses or nodules in either right or left breasts. No palpable axillary supraclavicular or infraclavicular adenopathy no breast tenderness or nipple discharge. (exam performed in the presence of a chaperone)  LABORATORY DATA:  I have reviewed the data as listed    Latest Ref Rng & Units 02/08/2022    9:36 AM 01/18/2022    9:06 AM 01/04/2022    2:42 PM  CMP  Glucose 70 - 99 mg/dL 102  95  104   BUN 6 - 20 mg/dL 5  11  9  Creatinine 0.44 - 1.00 mg/dL 0.64  0.79  0.71   Sodium 135 - 145 mmol/L 143  144  141   Potassium 3.5 - 5.1 mmol/L 3.1  3.2  3.5   Chloride 98 - 111 mmol/L 106  106  105   CO2 22 - 32 mmol/L 31  32  31   Calcium 8.9 - 10.3 mg/dL 9.5  9.3  9.4   Total Protein 6.5 - 8.1 g/dL 6.6  6.9  6.6   Total Bilirubin 0.3 - 1.2 mg/dL 0.8  0.6  0.6   Alkaline Phos 38 - 126 U/L 77  65  61   AST 15 - 41 U/L _0 ALT 0 - 44 U/L _1 Lab Results  Component Value Date   WBC 4.0 02/08/2022   HGB 11.2 (L) 02/08/2022   HCT 34.7 (L) 02/08/2022   MCV 89.2 02/08/2022   PLT 228 02/08/2022   NEUTROABS 2.2 02/08/2022    ASSESSMENT & PLAN:  No problem-specific Assessment & Plan notes found for this encounter.    No orders of the defined types were placed in this encounter.  The patient has a good  understanding of the overall plan. she agrees with it. she will call with any problems that may develop before the next visit here. Total time spent: 30 mins including face to face time and time spent for planning, charting and co-ordination of care   Suzzette Righter, [place] 03/05/22    I Gardiner Coins am acting as a Education administrator for [employer]  ***

## 2022-03-06 ENCOUNTER — Encounter (HOSPITAL_COMMUNITY): Payer: Self-pay

## 2022-03-06 ENCOUNTER — Ambulatory Visit (HOSPITAL_COMMUNITY)
Admission: RE | Admit: 2022-03-06 | Discharge: 2022-03-06 | Disposition: A | Discharge status: Home / Self Care | Payer: 59 | Source: Ambulatory Visit | Attending: Hematology and Oncology | Admitting: Hematology and Oncology

## 2022-03-06 DIAGNOSIS — C50511 Malignant neoplasm of lower-outer quadrant of right female breast: Secondary | ICD-10-CM | POA: Insufficient documentation

## 2022-03-06 DIAGNOSIS — Z170 Estrogen receptor positive status [ER+]: Secondary | ICD-10-CM

## 2022-03-06 MED ORDER — IOHEXOL 300 MG/ML  SOLN
100.0000 mL | Freq: Once | INTRAMUSCULAR | Status: AC | PRN
  Administered 2022-03-06: 100 mL via INTRAVENOUS

## 2022-03-06 MED ORDER — HEPARIN SOD (PORK) LOCK FLUSH 100 UNIT/ML IV SOLN
INTRAVENOUS | Status: AC
  Filled 2022-03-06: qty 5

## 2022-03-06 MED ORDER — HEPARIN SOD (PORK) LOCK FLUSH 100 UNIT/ML IV SOLN
500.0000 [IU] | Freq: Once | INTRAVENOUS | Status: AC
  Administered 2022-03-06: 500 [IU] via INTRAVENOUS

## 2022-03-07 MED FILL — Fosaprepitant Dimeglumine For IV Infusion 150 MG (Base Eq): INTRAVENOUS | Qty: 5 | Status: AC

## 2022-03-08 ENCOUNTER — Inpatient Hospital Stay: Payer: 59 | Admitting: Hematology and Oncology

## 2022-03-08 ENCOUNTER — Other Ambulatory Visit: Payer: Self-pay

## 2022-03-08 ENCOUNTER — Inpatient Hospital Stay: Payer: 59 | Attending: Hematology and Oncology

## 2022-03-08 ENCOUNTER — Inpatient Hospital Stay: Payer: 59

## 2022-03-08 VITALS — BP 141/73 | HR 66 | Temp 98.3°F | Wt 296.5 lb

## 2022-03-08 DIAGNOSIS — T451X5A Adverse effect of antineoplastic and immunosuppressive drugs, initial encounter: Secondary | ICD-10-CM | POA: Insufficient documentation

## 2022-03-08 DIAGNOSIS — Z170 Estrogen receptor positive status [ER+]: Secondary | ICD-10-CM | POA: Diagnosis not present

## 2022-03-08 DIAGNOSIS — R110 Nausea: Secondary | ICD-10-CM

## 2022-03-08 DIAGNOSIS — Z885 Allergy status to narcotic agent status: Secondary | ICD-10-CM | POA: Diagnosis not present

## 2022-03-08 DIAGNOSIS — N6315 Unspecified lump in the right breast, overlapping quadrants: Secondary | ICD-10-CM | POA: Insufficient documentation

## 2022-03-08 DIAGNOSIS — Z883 Allergy status to other anti-infective agents status: Secondary | ICD-10-CM | POA: Diagnosis not present

## 2022-03-08 DIAGNOSIS — Z793 Long term (current) use of hormonal contraceptives: Secondary | ICD-10-CM | POA: Insufficient documentation

## 2022-03-08 DIAGNOSIS — D6481 Anemia due to antineoplastic chemotherapy: Secondary | ICD-10-CM | POA: Insufficient documentation

## 2022-03-08 DIAGNOSIS — C50511 Malignant neoplasm of lower-outer quadrant of right female breast: Secondary | ICD-10-CM | POA: Diagnosis present

## 2022-03-08 DIAGNOSIS — Z79899 Other long term (current) drug therapy: Secondary | ICD-10-CM | POA: Diagnosis not present

## 2022-03-08 DIAGNOSIS — C50919 Malignant neoplasm of unspecified site of unspecified female breast: Secondary | ICD-10-CM

## 2022-03-08 DIAGNOSIS — Z888 Allergy status to other drugs, medicaments and biological substances status: Secondary | ICD-10-CM | POA: Insufficient documentation

## 2022-03-08 DIAGNOSIS — Z5112 Encounter for antineoplastic immunotherapy: Secondary | ICD-10-CM | POA: Diagnosis not present

## 2022-03-08 DIAGNOSIS — Z95828 Presence of other vascular implants and grafts: Secondary | ICD-10-CM

## 2022-03-08 LAB — CBC WITH DIFFERENTIAL (CANCER CENTER ONLY)
Abs Immature Granulocytes: 0.01 10*3/uL (ref 0.00–0.07)
Basophils Absolute: 0 10*3/uL (ref 0.0–0.1)
Basophils Relative: 1 %
Eosinophils Absolute: 0.2 10*3/uL (ref 0.0–0.5)
Eosinophils Relative: 4 %
HCT: 33.8 % — ABNORMAL LOW (ref 36.0–46.0)
Hemoglobin: 11.2 g/dL — ABNORMAL LOW (ref 12.0–15.0)
Immature Granulocytes: 0 %
Lymphocytes Relative: 27 %
Lymphs Abs: 1.2 10*3/uL (ref 0.7–4.0)
MCH: 28.9 pg (ref 26.0–34.0)
MCHC: 33.1 g/dL (ref 30.0–36.0)
MCV: 87.1 fL (ref 80.0–100.0)
Monocytes Absolute: 0.6 10*3/uL (ref 0.1–1.0)
Monocytes Relative: 13 %
Neutro Abs: 2.5 10*3/uL (ref 1.7–7.7)
Neutrophils Relative %: 55 %
Platelet Count: 194 10*3/uL (ref 150–400)
RBC: 3.88 MIL/uL (ref 3.87–5.11)
RDW: 15.5 % (ref 11.5–15.5)
WBC Count: 4.6 10*3/uL (ref 4.0–10.5)
nRBC: 0 % (ref 0.0–0.2)

## 2022-03-08 LAB — CMP (CANCER CENTER ONLY)
ALT: 9 U/L (ref 0–44)
AST: 19 U/L (ref 15–41)
Albumin: 3.6 g/dL (ref 3.5–5.0)
Alkaline Phosphatase: 74 U/L (ref 38–126)
Anion gap: 7 (ref 5–15)
BUN: 7 mg/dL (ref 6–20)
CO2: 30 mmol/L (ref 22–32)
Calcium: 9.3 mg/dL (ref 8.9–10.3)
Chloride: 106 mmol/L (ref 98–111)
Creatinine: 0.72 mg/dL (ref 0.44–1.00)
GFR, Estimated: >60 mL/min (ref >60)
Glucose, Bld: 93 mg/dL (ref 70–99)
Potassium: 2.8 mmol/L — ABNORMAL LOW (ref 3.5–5.1)
Sodium: 143 mmol/L (ref 135–145)
Total Bilirubin: 0.8 mg/dL (ref 0.3–1.2)
Total Protein: 6.3 g/dL — ABNORMAL LOW (ref 6.5–8.1)

## 2022-03-08 MED ORDER — POTASSIUM CHLORIDE 10 MEQ/100ML IV SOLN
10.0000 meq | Freq: Once | INTRAVENOUS | Status: AC
  Administered 2022-03-08: 10 meq via INTRAVENOUS
  Filled 2022-03-08: qty 100

## 2022-03-08 MED ORDER — CYANOCOBALAMIN 1000 MCG/ML IJ SOLN
1000.0000 ug | Freq: Once | INTRAMUSCULAR | Status: AC
  Administered 2022-03-08: 1000 ug via INTRAMUSCULAR
  Filled 2022-03-08: qty 1

## 2022-03-08 MED ORDER — ACETAMINOPHEN 325 MG PO TABS
650.0000 mg | ORAL_TABLET | Freq: Once | ORAL | Status: AC
  Administered 2022-03-08: 650 mg via ORAL
  Filled 2022-03-08: qty 2

## 2022-03-08 MED ORDER — SODIUM CHLORIDE 0.9 % IV SOLN: Freq: Once | INTRAVENOUS | Status: AC

## 2022-03-08 MED ORDER — SODIUM CHLORIDE 0.9% FLUSH
10.0000 mL | INTRAVENOUS | Status: DC | PRN
  Administered 2022-03-08: 10 mL

## 2022-03-08 MED ORDER — SODIUM CHLORIDE 0.9 % IV SOLN
150.0000 mg | Freq: Once | INTRAVENOUS | Status: AC
  Administered 2022-03-08: 150 mg via INTRAVENOUS
  Filled 2022-03-08: qty 150

## 2022-03-08 MED ORDER — FAM-TRASTUZUMAB DERUXTECAN-NXKI CHEMO 100 MG IV SOLR
2.9200 mg/kg | Freq: Once | INTRAVENOUS | Status: AC
  Administered 2022-03-08: 400 mg via INTRAVENOUS
  Filled 2022-03-08: qty 20

## 2022-03-08 MED ORDER — HEPARIN SOD (PORK) LOCK FLUSH 100 UNIT/ML IV SOLN
500.0000 [IU] | Freq: Once | INTRAVENOUS | Status: AC | PRN
  Administered 2022-03-08: 500 [IU]

## 2022-03-08 MED ORDER — PALONOSETRON HCL INJECTION 0.25 MG/5ML
0.2500 mg | Freq: Once | INTRAVENOUS | Status: AC
  Administered 2022-03-08: 0.25 mg via INTRAVENOUS
  Filled 2022-03-08: qty 5

## 2022-03-08 MED ORDER — DEXTROSE 5 % IV SOLN: INTRAVENOUS | Status: DC

## 2022-03-08 MED ORDER — SODIUM CHLORIDE 0.9% FLUSH
10.0000 mL | Freq: Once | INTRAVENOUS | Status: AC
  Administered 2022-03-08: 10 mL

## 2022-03-08 NOTE — Patient Instructions (Signed)
[facility]   Discharge Instructions: Thank you for choosing [facility] to provide your oncology and hematology care.   If you have a lab appointment with the [facility], please go directly to the [facility] and check in at the registration area.   Wear comfortable clothing and clothing appropriate for easy access to any Portacath or PICC line.   We strive to give you quality time with your provider. You may need to reschedule your appointment if you arrive late (15 or more minutes).  Arriving late affects you and other patients whose appointments are after yours.  Also, if you miss three or more appointments without notifying the office, you may be dismissed from the clinic at the provider's discretion.      For prescription refill requests, have your pharmacy contact our office and allow 72 hours for refills to be completed.    Today you received the following chemotherapy and/or immunotherapy agents: Fam-Trastuzumab Deruxtecan (Enhertu)      To help prevent nausea and vomiting after your treatment, we encourage you to take your nausea medication as directed.  BELOW ARE SYMPTOMS THAT SHOULD BE REPORTED IMMEDIATELY: *FEVER GREATER THAN 100.4 F (38 C) OR HIGHER *CHILLS OR SWEATING *NAUSEA AND VOMITING THAT IS NOT CONTROLLED WITH YOUR NAUSEA MEDICATION *UNUSUAL SHORTNESS OF BREATH *UNUSUAL BRUISING OR BLEEDING *URINARY PROBLEMS (pain or burning when urinating, or frequent urination) *BOWEL PROBLEMS (unusual diarrhea, constipation, pain near the anus) TENDERNESS IN MOUTH AND THROAT WITH OR WITHOUT PRESENCE OF ULCERS (sore throat, sores in mouth, or a toothache) UNUSUAL RASH, SWELLING OR PAIN  UNUSUAL VAGINAL DISCHARGE OR ITCHING   Items with * indicate a potential emergency and should be followed up as soon as possible or go to the Emergency Department if any problems should occur.  Please show the CHEMOTHERAPY ALERT CARD or  IMMUNOTHERAPY ALERT CARD at check-in to the Emergency Department and triage nurse.  Should you have questions after your visit or need to cancel or reschedule your appointment, please contact [facility]  Dept: [phone]  and follow the prompts.  Office hours are 8:00 a.m. to 4:30 p.m. Monday - Friday. Please note that voicemails left after 4:00 p.m. may not be returned until the following business day.  We are closed weekends and major holidays. You have access to a nurse at all times for urgent questions. Please call the main number to the clinic Dept: [phone] and follow the prompts.   For any non-urgent questions, you may also contact your provider using MyChart. We now offer e-Visits for anyone 35 and older to request care online for non-urgent symptoms. For details visit [email or web address].   Also download the MyChart app! Go to the app store, search "MyChart", open the app, select [facility], and log in with your MyChart username and password.

## 2022-03-08 NOTE — Assessment & Plan Note (Signed)
05/03/2020:Screening mammogram showed a 1.0cm mass at the 6 o'clock position in the right breast. Diagnostic mammogram and US showed the 1.2cm mass at the 6 o'clock position in the right breast. Biopsy showed IDC, grade 3, HER-2 positive (3+), ER 15% weak, PR-, Ki67 80%.   06/02/2020:Right lumpectomy ([facility]): invasive and in situ ductal carcinoma, 1.3cm, clear margins, 1 right axillary lymph node negative for carcinoma. ER 15% weak, PR-,HER-2 positive (3+) Ki67 80%.   Treatment plan: 1.  Adjuvant chemotherapy with Taxol Herceptin followed by Herceptin maintenance.  Stopped after 11 cycles of Taxol Herceptin maintenance completed 07/07/2021  2.  Adjuvant radiation therapy 11/23/2020-12/19/2020 3.  Adjuvant antiestrogen therapy with tamoxifen started October 2022 4.  CT angiogram 07/13/2021: Right supraclavicular lymph node 5 cm, right internal mammary lymph nodes 1.8 cm 5.  Metastatic breast cancer: June 2023:Right supraclavicular lymphadenopathy: Biopsy: Metastatic poorly differentiated adenocarcinoma, ER +40%, PR negative, HER2 positive 3+ PET CT scan 08/06/21: Ext Right Supra clav LN along with Internal mammary and Rt Upper Mediastinal and Rt Axillary LN -----------------------------------------------------------------------------------------------------  Current treatment: Enhertu started on 08/21/2021, (this treatment on hold) today is cycle 9 Enhertu toxicities: 1,.  Severe nausea, requiring IV fluids: Added Emend to the regimen, she is doing significantly better 2. Chemotherapy-induced anemia: Monitoring closely, hemoglobin 11.2 3.  Hospitalization 11/26/2021-11/28/2021: Dizziness and fatigue, esophageal dysmotility   Because of her ongoing issues with hypokalemia and diarrhea: We will add IV fluids with potassium on Saturday and next Thursday.   CT CAP 03/06/2022: Results pending Return to clinic every 3 weeks for Enhertu

## 2022-03-08 NOTE — Progress Notes (Signed)
Per Dr. Lindi Adie - okay to proceed with treatment with echo from September 2023.

## 2022-03-09 ENCOUNTER — Inpatient Hospital Stay: Payer: 59

## 2022-03-09 ENCOUNTER — Other Ambulatory Visit: Payer: Self-pay

## 2022-03-09 ENCOUNTER — Other Ambulatory Visit: Payer: Self-pay | Admitting: Hematology and Oncology

## 2022-03-09 VITALS — BP 148/82 | HR 58 | Resp 18

## 2022-03-09 DIAGNOSIS — C50919 Malignant neoplasm of unspecified site of unspecified female breast: Secondary | ICD-10-CM

## 2022-03-09 DIAGNOSIS — R110 Nausea: Secondary | ICD-10-CM

## 2022-03-09 DIAGNOSIS — Z5112 Encounter for antineoplastic immunotherapy: Secondary | ICD-10-CM | POA: Diagnosis not present

## 2022-03-09 DIAGNOSIS — C50511 Malignant neoplasm of lower-outer quadrant of right female breast: Secondary | ICD-10-CM

## 2022-03-09 DIAGNOSIS — E876 Hypokalemia: Secondary | ICD-10-CM

## 2022-03-09 DIAGNOSIS — Z95828 Presence of other vascular implants and grafts: Secondary | ICD-10-CM

## 2022-03-09 MED ORDER — POTASSIUM CHLORIDE 10 MEQ/100ML IV SOLN
10.0000 meq | INTRAVENOUS | Status: AC
  Administered 2022-03-09 (×2): 10 meq via INTRAVENOUS
  Filled 2022-03-09 (×2): qty 100

## 2022-03-09 MED ORDER — SODIUM CHLORIDE 0.9 % IV SOLN: INTRAVENOUS | Status: DC

## 2022-03-09 MED ORDER — HEPARIN SOD (PORK) LOCK FLUSH 100 UNIT/ML IV SOLN
500.0000 [IU] | Freq: Once | INTRAVENOUS | Status: AC | PRN
  Administered 2022-03-09: 500 [IU]

## 2022-03-09 MED ORDER — SODIUM CHLORIDE 0.9% FLUSH
10.0000 mL | Freq: Once | INTRAVENOUS | Status: AC
  Administered 2022-03-09: 10 mL

## 2022-03-09 NOTE — Patient Instructions (Signed)

## 2022-03-10 ENCOUNTER — Inpatient Hospital Stay: Payer: 59

## 2022-03-13 ENCOUNTER — Telehealth: Payer: Self-pay | Admitting: Hematology and Oncology

## 2022-03-13 ENCOUNTER — Other Ambulatory Visit: Payer: Self-pay | Admitting: Hematology and Oncology

## 2022-03-13 NOTE — Telephone Encounter (Signed)
Scheduled appointments per WQ. Patient is aware of all made appointments. 

## 2022-03-14 ENCOUNTER — Other Ambulatory Visit: Payer: Self-pay | Admitting: *Deleted

## 2022-03-14 ENCOUNTER — Inpatient Hospital Stay: Payer: 59 | Admitting: Licensed Clinical Social Worker

## 2022-03-14 DIAGNOSIS — C50511 Malignant neoplasm of lower-outer quadrant of right female breast: Secondary | ICD-10-CM

## 2022-03-14 NOTE — Progress Notes (Signed)
Per pt request, referral placed to social work.

## 2022-03-14 NOTE — Progress Notes (Signed)
Pamela [name]  Initial Assessment   Pamela [name] is a 57 y.o. year old female contacted by phone. Clinical Social [name] was referred by self for assessment of psychosocial needs.   SDOH (Social Determinants of Health) assessments performed: Yes SDOH Interventions    Flowsheet Row Clinical Support from 03/14/2022 in [facility]  SDOH Interventions   Financial Strain Interventions Other (Comment)  [cancer foundations]       SDOH Screenings   Depression (PHQ2-9): Medium Risk (03/09/2021)  Financial Resource Strain: High Risk (03/14/2022)  Tobacco Use: Low Risk  (03/06/2022)     Distress Screen completed: No   Family/Social Information:  Housing Arrangement: patient lives alone in an apartment Family members/support persons in your life? Family Transportation concerns: no  Employment: was working full time as Copywriter, advertising. Unable to do previous role d/t treatment, so out of [name] since early December. Is trying to find an alternate role within the agency. No short-term disability Income source: Employment- no income currently, has used all of her paid time off Financial concerns: Yes, due to illness and/or loss of [name] during treatment Type of concern: Utilities, Film/video editor, Medical bills, and Food Food access concerns: yes, costs Religious or spiritual practice: Not known Services Currently in place:    Coping/ Adjustment to diagnosis: Patient understands treatment plan and what happens next? yes Concerns about diagnosis and/or treatment: Losing my job and/or losing income and How I will pay for the services I need and other daily living costs Patient reported stressors: [name]/ school and Finances. Pt has reached out to local resources already Advertising account planner, [employer]) Current coping skills/ strengths: Capable of independent living , Armed forces logistics/support/administrative officer , [name] skills , and Other: knowledge of community resources     SUMMARY: Current SDOH Barriers:  Financial constraints related to being out of [name]  Clinical Social [name] Clinical Goal(s):  Patient will [name] with SW to address concerns related to financial strain  Interventions: Provided CSW contact information and encouraged patient to call with any questions or concerns Provided patient with information about cancer foundations for financial assistance   Follow Up Plan: CSW will see patient on 1/26 for [place] sign up and follow-up on resources. Pt will look at information given today and contact CSW with questions about applications Patient verbalizes understanding of plan: Yes    Pamela Mood E Orva Riles, LCSW

## 2022-03-21 ENCOUNTER — Telehealth: Payer: Self-pay | Admitting: Hematology and Oncology

## 2022-03-21 NOTE — Telephone Encounter (Signed)
Cancelled appointment per provider. Patient does not need to be seen per Dr.Gudena. Patient is aware of all changes made to her upcoming appointment.

## 2022-03-28 ENCOUNTER — Ambulatory Visit (HOSPITAL_BASED_OUTPATIENT_CLINIC_OR_DEPARTMENT_OTHER)
Admission: RE | Admit: 2022-03-28 | Discharge: 2022-03-28 | Disposition: A | Discharge status: Home / Self Care | Payer: 59 | Source: Ambulatory Visit | Attending: Internal Medicine | Admitting: Internal Medicine

## 2022-03-28 ENCOUNTER — Encounter (HOSPITAL_COMMUNITY): Payer: Self-pay | Admitting: Internal Medicine

## 2022-03-28 ENCOUNTER — Ambulatory Visit (HOSPITAL_COMMUNITY)
Admission: RE | Admit: 2022-03-28 | Discharge: 2022-03-28 | Disposition: A | Discharge status: Home / Self Care | Payer: 59 | Source: Ambulatory Visit | Attending: Internal Medicine | Admitting: Internal Medicine

## 2022-03-28 VITALS — BP 122/70 | HR 75 | Wt 300.8 lb

## 2022-03-28 DIAGNOSIS — R0683 Snoring: Secondary | ICD-10-CM

## 2022-03-28 DIAGNOSIS — Z0189 Encounter for other specified special examinations: Secondary | ICD-10-CM | POA: Diagnosis not present

## 2022-03-28 DIAGNOSIS — Z01818 Encounter for other preprocedural examination: Secondary | ICD-10-CM | POA: Insufficient documentation

## 2022-03-28 DIAGNOSIS — Z6841 Body Mass Index (BMI) 40.0 and over, adult: Secondary | ICD-10-CM

## 2022-03-28 DIAGNOSIS — Z170 Estrogen receptor positive status [ER+]: Secondary | ICD-10-CM | POA: Diagnosis not present

## 2022-03-28 DIAGNOSIS — C50919 Malignant neoplasm of unspecified site of unspecified female breast: Secondary | ICD-10-CM | POA: Diagnosis not present

## 2022-03-28 DIAGNOSIS — I509 Heart failure, unspecified: Secondary | ICD-10-CM | POA: Insufficient documentation

## 2022-03-28 DIAGNOSIS — C50511 Malignant neoplasm of lower-outer quadrant of right female breast: Secondary | ICD-10-CM | POA: Insufficient documentation

## 2022-03-28 DIAGNOSIS — I110 Hypertensive heart disease with heart failure: Secondary | ICD-10-CM | POA: Insufficient documentation

## 2022-03-28 LAB — ECHOCARDIOGRAM COMPLETE
AR max vel: 2.95 cm2
AV Area VTI: 2.73 cm2
AV Area mean vel: 2.67 cm2
AV Mean grad: 5 mmHg
AV Peak grad: 8.8 mmHg
Ao pk vel: 1.48 m/s
Area-P 1/2: 2.73 cm2
S' Lateral: 3.1 cm

## 2022-03-28 NOTE — Addendum Note (Signed)
Encounter addended by: Stanford Scotland, RN on: 03/28/2022 9:37 AM  Actions taken: Visit diagnoses modified, Order list changed, Diagnosis association updated, Clinical Note Signed

## 2022-03-28 NOTE — Patient Instructions (Signed)
Good to see you today!  No changes to your medications  Your physician has requested that you have an echocardiogram. Echocardiography is a painless test that uses sound waves to create images of your heart. It provides your doctor with information about the size and shape of your heart and how well your heart's chambers and valves are working. This procedure takes approximately one hour. There are no restrictions for this procedure. Please do NOT wear cologne, perfume, aftershave, or lotions (deodorant is allowed). Please arrive 15 minutes prior to your appointment time.  Your physician recommends that you schedule a follow-up appointment in: 3 months with echocardiogram Call office February  to schedule an appointment  If you have any questions or concerns before your next appointment please send [place] a message through [place] or call our office at [phone].    TO LEAVE A MESSAGE FOR THE NURSE SELECT OPTION 2, PLEASE LEAVE A MESSAGE INCLUDING: YOUR NAME DATE OF BIRTH CALL BACK NUMBER REASON FOR CALL**this is important as we prioritize the call backs  YOU WILL RECEIVE A CALL BACK THE SAME DAY AS LONG AS YOU CALL BEFORE 4:00 PM  At the [facility], you and your health needs are our priority. As part of our continuing mission to provide you with exceptional heart care, we have created designated Provider Care Teams. These Care Teams include your primary Cardiologist (physician) and Advanced Practice Providers (APPs- Physician Assistants and Nurse Practitioners) who all work together to provide you with the care you need, when you need it.   You may see any of the following providers on your designated Care Team at your next follow up: Dr Glori Bickers Dr Loralie Champagne Dr. Roxana Hires, NP Lyda Jester, [place] [facility] [place], [place] Forestine Na, NP Audry Riles, PharmD   Please be sure to bring in all your medications bottles to every  appointment.

## 2022-03-28 NOTE — Progress Notes (Signed)
  Echocardiogram 2D Echocardiogram has been performed.  Pamela [name] 03/28/2022, 8:49 AM

## 2022-03-28 NOTE — Progress Notes (Signed)
CARDIO-ONCOLOGY CLINIC NOTE  Referring Physician: Dr. Lindi [name] Primary Care: Pamela [name], Pamela [name] Primary Cardiologist: Dr. Gwenlyn [name]  HPI:  Ms. Pamela [name] is 57 y.o. female with obesity, HTN, HL and right breast cancer  diagnosed referred by Dr. Lindi [name]  for enrollment into the Cardio-Oncology program.   Malignant neoplasm of lower-outer quadrant of right breast of female, estrogen receptor positive ([place])  05/03/2020 Initial Diagnosis    Screening mammogram showed a 1.0cm mass at the 6 o'clock position in the right breast. Diagnostic mammogram and US showed the 1.2cm mass at the 6 o'clock position in the right breast. Biopsy showed IDC, grade 3, HER-2 positive (3+), ER 15% weak, PR-, Ki67 80%.    05/11/2020 Cancer Staging    Staging form: Breast, AJCC 8th Edition - Clinical stage from 05/11/2020: Stage IA (cT1b, cN0, cM0, G3, ER+, PR-, HER2+) - Signed by Pamela [name], Pamela [name] on 05/11/2020 Stage prefix: Initial diagnosis    05/19/2020 Genetic Testing    Negative hereditary cancer genetic testing: no pathogenic variants detected in [facility] Breast STAT Panel and Common Hereditary Cancers Panel.  The report dates are May 19, 2020 (STAT) and May 23, 2020 (Common Hereditary).    The Common Hereditary Cancers Panel offered by [facility] includes sequencing and/or deletion duplication testing of the following 47 genes: APC, ATM, AXIN2, BARD1, BMPR1A, BRCA1, BRCA2, BRIP1, CDH1, CDK4, CDKN2A (p14ARF), CDKN2A (p16INK4a), CHEK2, CTNNA1, DICER1, EPCAM (Deletion/duplication testing only), GREM1 (promoter region deletion/duplication testing only), GREM1, HOXB13, KIT, MEN1, MLH1, MSH2, MSH3, MSH6, MUTYH, NBN, NF1, NHTL1, PALB2, PDGFRA, PMS2, POLD1, POLE, PTEN, RAD50, RAD51C, RAD51D, SDHA, SDHB, SDHC, SDHD, SMAD4, SMARCA4. STK11, TP53, TSC1, TSC2, and VHL.  The following genes were evaluated for sequence changes only: SDHA and HOXB13 c.251G>A variant only.es only: SDHA and HOXB13 c.251G>A variant only.    06/02/2020 Surgery     Right lumpectomy (Pamela [name]): invasive and in situ ductal carcinoma, 1.3cm, clear margins, 1 right axillary lymph node negative for carcinoma.    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 Denies known heart disease.  Diagnosed with HER-2+ breast CA in 3/22. Treated with lumpectomy in 3/22. Then Taxol/Herceptin 12 rounds from 5/22-8/22. Now on Herceptin q 3 weeks. Finished XRT in 10/22.   Finished Herceptin 07/08/21. [name] to have recurrent disease in lymph node in her neck. Enhertu started on 08/21/21. Admitted 9/23 with fatigue and hypokalemia. Feeling better now. Missed a month of Enhertu and now back on. Feeling better. No HF symptoms. Scheduled for 3 more Enhertu doses prior to repeat scans.   Echo today 03/28/21: EF 55-60% mod LVVH G1DD GLS -18.0   Echo 09/21/21 EF 60-65% Mild to mod LVH G1DD GLS -15.9% (underestimated)    Echo 4/23 EF 55% GLS -19.9% Echo1/17/22 Echo 55-60%  Mod LVH G2DD GLS 15.0% Echo 05/20/20: EF 60-65% GLS -21.2% Echo 08/30/20: EF 60-65% GLS - 20.7% Echo 10/22: EF 60-65% GLS reported as -13.3%    Past Medical History:  Diagnosis Date   Anxiety    Asthma  Back pain    Breast cancer (HCC)    Constipation    DUB (dysfunctional uterine bleeding) 2007   Edema of both lower extremities    Elevated cholesterol    Family history of breast cancer 05/12/2020   Family history of lung cancer 05/12/2020   Food allergy    H/O menorrhagia 05/01/2006   High cholesterol    History of ovarian cyst 2007   Hypertension 03/31/2005    Increased BMI 07/11/2006   Joint pain    Migraine    N&V (nausea and vomiting)    Obesity 2007   Obstructive sleep apnea syndrome, moderate 10/27/2013   no cpap   Sleep apnea    SOB (shortness of breath)    Vitamin D deficiency    Vitamin D deficiency    Weight loss 07/11/2006    Current Outpatient Medications  Medication Sig Dispense Refill   amLODipine (NORVASC) 10 MG tablet Take 1 tablet (10 mg total) by mouth daily. 90 tablet 2   aspirin EC 81 MG tablet Take 1 tablet (81 mg total) by mouth daily. Swallow whole. 30 tablet 11   benzonatate (TESSALON PERLES) 100 MG capsule Take 1 capsule (100 mg total) by mouth 3 (three) times daily as needed for cough. 30 capsule 1   EDARBYCLOR 40-25 MG TABS Take 1 tablet by mouth daily. 90 tablet 2   levonorgestrel (MIRENA, 52 MG,) 20 MCG/DAY IUD 1 each by Intrauterine route once.     lidocaine-prilocaine (EMLA) cream Apply 1 application topically daily as needed. 60 g 0   LORazepam (ATIVAN) 0.5 MG tablet Take 1 tablet (0.5 mg total) by mouth every 8 (eight) hours as needed for anxiety. 30 tablet 0   potassium chloride SA (KLOR-CON M) 20 MEQ tablet Take 1 tablet (20 mEq total) by mouth daily. 30 tablet 0   prochlorperazine (COMPAZINE) 10 MG tablet Take 1 tablet (10 mg total) by mouth every 6 (six) hours as needed (Nausea or vomiting). 30 tablet 1   rosuvastatin (CRESTOR) 40 MG tablet Take 1 tablet (40 mg total) by mouth daily. 90 tablet 3   No current facility-administered medications for this encounter.   Facility-Administered Medications Ordered in Other Encounters  Medication Dose Route Frequency Provider Last Rate Last Admin   heparin lock flush 100 unit/mL  500 Units Intracatheter Once Pamela [name], Pamela [name]       sodium chloride flush (NS) 0.9 % injection 10 mL  10 mL Intracatheter Once Pamela [name], Pamela [name]        Allergies  Allergen Reactions   Pollen Extract Shortness Of Breath   Compazine [Prochlorperazine] Anxiety    IV compazine causes  severe anxiety attack   Latex Rash   Codeine Hives   Diflucan [Fluconazole] Hives   Shellfish Allergy Hives   Betadine [Povidone Iodine] Rash      Social History   Socioeconomic History   Marital status: Single    Spouse name: Not on file   Number of children: Not on file   Years of education: Not on file   Highest education level: Not on file  Occupational History   Occupation: Education officer, museum  Tobacco Use   Smoking status: Never   Smokeless tobacco: Never  Vaping Use   Vaping Use: Never used  Substance and Sexual Activity   Alcohol use: No   Drug use: Not Currently    Frequency: 2.0 times per week   Sexual activity: Not Currently    Birth control/protection: I.U.D.  Comment: Mirena  Other Topics Concern   Not on file  Social History Narrative   Not on file   Social Determinants of Health   Financial Resource Strain: High Risk (03/14/2022)   Overall Financial Resource Strain (CARDIA)    Difficulty of Paying Living Expenses: Hard  Food Insecurity: Not on file  Transportation Needs: Not on file  Physical Activity: Not on file  Stress: Not on file  Social Connections: Not on file  Intimate Partner Violence: Not on file      Family History  Problem Relation Age of Onset   Hypertension Mother    High Cholesterol Mother    Thyroid disease Mother    Cancer Mother    Sleep apnea Mother    Hypertension Father    Heart attack Father    Sudden death Father    Diabetes Maternal Grandmother    Bone cancer Maternal Grandfather        dx after 70   Breast cancer Maternal Aunt        dx 60s   Lung cancer Maternal Aunt        dx after 50    Vitals:   03/28/22 0854  BP: 122/70  Pulse: 75  SpO2: 99%  Weight: (!) 136.4 kg (300 lb 12.8 oz)    Wt Readings from Last 3 Encounters:  03/28/22 (!) 136.4 kg (300 lb 12.8 oz)  03/08/22 134.5 kg (296 lb 8 oz)  02/08/22 133.9 kg (295 lb 1.6 oz)    PHYSICAL EXAM: General:  Well appearing. No resp difficulty HEENT:  normal Neck: supple. no JVD. RIJ port Carotids 2+ bilat; no bruits. No lymphadenopathy or thryomegaly appreciated. Cor: PMI nondisplaced. Regular rate & rhythm. No rubs, gallops or murmurs. Lungs: clear Abdomen: obese soft, nontender, nondistended. No hepatosplenomegaly. No bruits or masses. Good bowel sounds. Extremities: no cyanosis, clubbing, rash, edema Neuro: alert & orientedx3, cranial nerves grossly intact. moves all 4 extremities w/o difficulty. Affect pleasant  ASSESSMENT & PLAN:  1. Right Breast Cancer, stage IV - HER2 + - Treated with lumpectomy in 3/22. Then Taxol/Herceptin 12 rounds from 5/22-8/22. Now on Herceptin q 3 weeks. Also about to finish XRT.  - Echo 10/22 EF 60-65% GLS reported as -13.3%. I have reviewed this and reading is inaccurate due to poor endocardial tracking - Echo  03/21/20 EF 55-60%  Mod LVH G2DD GLS 15.0% - Echo 06/22/21 EF 55% Mild LVH G1DD GLS -19.9% - Finished Herceptin 07/08/21. [name] to have recurrent disease in lymph node in her neck. Enhertu started on 08/21/21.  - Echo today 09/21/21 EF 60-65% Mild to mod LVH G1DD GLS -15.9% (underestimated) - Personally reviewed - Echo today 03/28/21: EF 55-60% mod LVVH G1DD GLS -18.0  - Continue echos every 3 months while on EnHertu  2. HTN with LVH on echo  - Blood pressure well controlled. Continue current regimen. - Home sleep study ordered previously. Not completed  3. Snoring/daytime somnolence - pending sleep study. Has WatchPat at home. Encouraged to do sleep study when she is up to it  4. Morbid obesity - has enrolled in weight loss clinic - Body mass index is 44.42 kg/m. - Possible GLP1RA candidate?    Pamela Bickers, Pamela [name]  9:25 AM

## 2022-03-29 ENCOUNTER — Other Ambulatory Visit: Payer: Self-pay | Admitting: *Deleted

## 2022-03-29 DIAGNOSIS — C50511 Malignant neoplasm of lower-outer quadrant of right female breast: Secondary | ICD-10-CM

## 2022-03-29 MED FILL — Fosaprepitant Dimeglumine For IV Infusion 150 MG (Base Eq): INTRAVENOUS | Qty: 5 | Status: AC

## 2022-03-30 ENCOUNTER — Other Ambulatory Visit: Payer: Self-pay

## 2022-03-30 ENCOUNTER — Inpatient Hospital Stay: Payer: 59

## 2022-03-30 ENCOUNTER — Other Ambulatory Visit: Payer: 59

## 2022-03-30 ENCOUNTER — Inpatient Hospital Stay: Payer: 59 | Admitting: Licensed Clinical Social Worker

## 2022-03-30 ENCOUNTER — Ambulatory Visit: Payer: 59 | Admitting: Adult Health

## 2022-03-30 VITALS — BP 125/74 | HR 65 | Temp 98.2°F | Resp 18 | Wt 301.5 lb

## 2022-03-30 DIAGNOSIS — Z5112 Encounter for antineoplastic immunotherapy: Secondary | ICD-10-CM | POA: Diagnosis not present

## 2022-03-30 DIAGNOSIS — Z170 Estrogen receptor positive status [ER+]: Secondary | ICD-10-CM

## 2022-03-30 DIAGNOSIS — Z95828 Presence of other vascular implants and grafts: Secondary | ICD-10-CM

## 2022-03-30 DIAGNOSIS — R110 Nausea: Secondary | ICD-10-CM

## 2022-03-30 DIAGNOSIS — C50919 Malignant neoplasm of unspecified site of unspecified female breast: Secondary | ICD-10-CM

## 2022-03-30 LAB — CBC WITH DIFFERENTIAL (CANCER CENTER ONLY)
Abs Immature Granulocytes: 0.02 10*3/uL (ref 0.00–0.07)
Basophils Absolute: 0 10*3/uL (ref 0.0–0.1)
Basophils Relative: 1 %
Eosinophils Absolute: 0.1 10*3/uL (ref 0.0–0.5)
Eosinophils Relative: 3 %
HCT: 32.6 % — ABNORMAL LOW (ref 36.0–46.0)
Hemoglobin: 10.6 g/dL — ABNORMAL LOW (ref 12.0–15.0)
Immature Granulocytes: 1 %
Lymphocytes Relative: 25 %
Lymphs Abs: 1.1 10*3/uL (ref 0.7–4.0)
MCH: 28.3 pg (ref 26.0–34.0)
MCHC: 32.5 g/dL (ref 30.0–36.0)
MCV: 87.2 fL (ref 80.0–100.0)
Monocytes Absolute: 0.6 10*3/uL (ref 0.1–1.0)
Monocytes Relative: 14 %
Neutro Abs: 2.4 10*3/uL (ref 1.7–7.7)
Neutrophils Relative %: 56 %
Platelet Count: 197 10*3/uL (ref 150–400)
RBC: 3.74 MIL/uL — ABNORMAL LOW (ref 3.87–5.11)
RDW: 16.4 % — ABNORMAL HIGH (ref 11.5–15.5)
WBC Count: 4.2 10*3/uL (ref 4.0–10.5)
nRBC: 0 % (ref 0.0–0.2)

## 2022-03-30 LAB — HEMOGLOBIN A1C
Hgb A1c MFr Bld: 5.5 % (ref 4.8–5.6)
Mean Plasma Glucose: 111.15 mg/dL

## 2022-03-30 LAB — CMP (CANCER CENTER ONLY)
ALT: 12 U/L (ref 0–44)
AST: 23 U/L (ref 15–41)
Albumin: 3.3 g/dL — ABNORMAL LOW (ref 3.5–5.0)
Alkaline Phosphatase: 81 U/L (ref 38–126)
Anion gap: 5 (ref 5–15)
BUN: 12 mg/dL (ref 6–20)
CO2: 29 mmol/L (ref 22–32)
Calcium: 9.2 mg/dL (ref 8.9–10.3)
Chloride: 108 mmol/L (ref 98–111)
Creatinine: 0.72 mg/dL (ref 0.44–1.00)
GFR, Estimated: >60 mL/min (ref >60)
Glucose, Bld: 99 mg/dL (ref 70–99)
Potassium: 3.4 mmol/L — ABNORMAL LOW (ref 3.5–5.1)
Sodium: 142 mmol/L (ref 135–145)
Total Bilirubin: 0.8 mg/dL (ref 0.3–1.2)
Total Protein: 6.4 g/dL — ABNORMAL LOW (ref 6.5–8.1)

## 2022-03-30 MED ORDER — HEPARIN SOD (PORK) LOCK FLUSH 100 UNIT/ML IV SOLN
500.0000 [IU] | Freq: Once | INTRAVENOUS | Status: AC | PRN
  Administered 2022-03-30: 500 [IU]

## 2022-03-30 MED ORDER — DEXTROSE 5 % IV SOLN: Freq: Once | INTRAVENOUS | Status: AC

## 2022-03-30 MED ORDER — POTASSIUM CHLORIDE 10 MEQ/100ML IV SOLN
10.0000 meq | Freq: Once | INTRAVENOUS | Status: AC
  Administered 2022-03-30: 10 meq via INTRAVENOUS
  Filled 2022-03-30: qty 100

## 2022-03-30 MED ORDER — SODIUM CHLORIDE 0.9 % IV SOLN: Freq: Once | INTRAVENOUS | Status: AC

## 2022-03-30 MED ORDER — FAM-TRASTUZUMAB DERUXTECAN-NXKI CHEMO 100 MG IV SOLR
3.2000 mg/kg | Freq: Once | INTRAVENOUS | Status: AC
  Administered 2022-03-30: 400 mg via INTRAVENOUS
  Filled 2022-03-30: qty 20

## 2022-03-30 MED ORDER — SODIUM CHLORIDE 0.9% FLUSH
10.0000 mL | INTRAVENOUS | Status: DC | PRN
  Administered 2022-03-30: 10 mL

## 2022-03-30 MED ORDER — SODIUM CHLORIDE 0.9 % IV SOLN
150.0000 mg | Freq: Once | INTRAVENOUS | Status: AC
  Administered 2022-03-30: 150 mg via INTRAVENOUS
  Filled 2022-03-30: qty 150

## 2022-03-30 MED ORDER — SODIUM CHLORIDE 0.9% FLUSH
10.0000 mL | Freq: Once | INTRAVENOUS | Status: AC
  Administered 2022-03-30: 10 mL

## 2022-03-30 MED ORDER — ACETAMINOPHEN 325 MG PO TABS
650.0000 mg | ORAL_TABLET | Freq: Once | ORAL | Status: AC
  Administered 2022-03-30: 650 mg via ORAL
  Filled 2022-03-30: qty 2

## 2022-03-30 MED ORDER — PALONOSETRON HCL INJECTION 0.25 MG/5ML
0.2500 mg | Freq: Once | INTRAVENOUS | Status: AC
  Administered 2022-03-30: 0.25 mg via INTRAVENOUS
  Filled 2022-03-30: qty 5

## 2022-03-30 NOTE — Progress Notes (Signed)
[facility] CSW Progress Note  Holiday representative met with patient to follow-up on resources.  Brief visit today as pt's medications were making her sleepy.  CSW signed pt up for & provided [facility] card as well as bag from food pantry.  Pt did review breast cancer foundation information but could not remember her questions today- she will call or e-mail CSW later.   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    [name], LCSW

## 2022-03-30 NOTE — Progress Notes (Signed)
Keep dose at '400mg'$  as it has been

## 2022-03-30 NOTE — Patient Instructions (Signed)
[facility]  Discharge Instructions: Thank you for choosing [facility] to provide your oncology and hematology care.   If you have a lab appointment with the Cancer Center, please go directly to the Cancer Center and check in at the registration area.   Wear comfortable clothing and clothing appropriate for easy access to any Portacath or PICC line.   We strive to give you quality time with your provider. You may need to reschedule your appointment if you arrive late (15 or more minutes).  Arriving late affects you and other patients whose appointments are after yours.  Also, if you miss three or more appointments without notifying the office, you may be dismissed from the clinic at the provider's discretion.      For prescription refill requests, have your pharmacy contact our office and allow 72 hours for refills to be completed.    Today you received the following chemotherapy and/or immunotherapy agents: Enhertu      To help prevent nausea and vomiting after your treatment, we encourage you to take your nausea medication as directed.  BELOW ARE SYMPTOMS THAT SHOULD BE REPORTED IMMEDIATELY: *FEVER GREATER THAN 100.4 F (38 C) OR HIGHER *CHILLS OR SWEATING *NAUSEA AND VOMITING THAT IS NOT CONTROLLED WITH YOUR NAUSEA MEDICATION *UNUSUAL SHORTNESS OF BREATH *UNUSUAL BRUISING OR BLEEDING *URINARY PROBLEMS (pain or burning when urinating, or frequent urination) *BOWEL PROBLEMS (unusual diarrhea, constipation, pain near the anus) TENDERNESS IN MOUTH AND THROAT WITH OR WITHOUT PRESENCE OF ULCERS (sore throat, sores in mouth, or a toothache) UNUSUAL RASH, SWELLING OR PAIN  UNUSUAL VAGINAL DISCHARGE OR ITCHING   Items with * indicate a potential emergency and should be followed up as soon as possible or go to the Emergency Department if any problems should occur.  Please show the CHEMOTHERAPY ALERT CARD or IMMUNOTHERAPY ALERT CARD at  check-in to the Emergency Department and triage nurse.  Should you have questions after your visit or need to cancel or reschedule your appointment, please contact [facility]  Dept: [phone]  and follow the prompts.  Office hours are 8:00 a.m. to 4:30 p.m. Monday - Friday. Please note that voicemails left after 4:00 p.m. may not be returned until the following business day.  We are closed weekends and major holidays. You have access to a nurse at all times for urgent questions. Please call the main number to the clinic Dept: [phone] and follow the prompts.   For any non-urgent questions, you may also contact your provider using MyChart. We now offer e-Visits for anyone 18 and older to request care online for non-urgent symptoms. For details visit [email or web address].   Also download the MyChart app! Go to the app store, search "MyChart", open the app, select [place], and log in with your MyChart username and password.  

## 2022-03-31 ENCOUNTER — Inpatient Hospital Stay: Payer: 59

## 2022-03-31 ENCOUNTER — Ambulatory Visit: Payer: 59

## 2022-03-31 VITALS — BP 138/61 | HR 57 | Temp 97.2°F | Resp 16

## 2022-03-31 DIAGNOSIS — Z95828 Presence of other vascular implants and grafts: Secondary | ICD-10-CM

## 2022-03-31 DIAGNOSIS — R110 Nausea: Secondary | ICD-10-CM

## 2022-03-31 DIAGNOSIS — Z170 Estrogen receptor positive status [ER+]: Secondary | ICD-10-CM

## 2022-03-31 DIAGNOSIS — C50919 Malignant neoplasm of unspecified site of unspecified female breast: Secondary | ICD-10-CM

## 2022-03-31 DIAGNOSIS — Z5112 Encounter for antineoplastic immunotherapy: Secondary | ICD-10-CM | POA: Diagnosis not present

## 2022-03-31 MED ORDER — POTASSIUM CHLORIDE 10 MEQ/100ML IV SOLN: 10.0000 meq | INTRAVENOUS | Status: DC

## 2022-03-31 MED ORDER — HEPARIN SOD (PORK) LOCK FLUSH 100 UNIT/ML IV SOLN
500.0000 [IU] | Freq: Once | INTRAVENOUS | Status: AC
  Administered 2022-03-31: 500 [IU]

## 2022-03-31 MED ORDER — ALTEPLASE 2 MG IJ SOLR: 2.0000 mg | Freq: Once | INTRAMUSCULAR | Status: DC | PRN

## 2022-03-31 MED ORDER — CYANOCOBALAMIN 1000 MCG/ML IJ SOLN
1000.0000 ug | Freq: Once | INTRAMUSCULAR | Status: AC
  Administered 2022-03-31: 1000 ug via INTRAMUSCULAR
  Filled 2022-03-31: qty 1

## 2022-03-31 MED ORDER — POTASSIUM CHLORIDE 10 MEQ/100ML IV SOLN
10.0000 meq | Freq: Once | INTRAVENOUS | Status: AC
  Administered 2022-03-31: 10 meq via INTRAVENOUS
  Filled 2022-03-31: qty 100

## 2022-03-31 MED ORDER — HEPARIN SOD (PORK) LOCK FLUSH 100 UNIT/ML IV SOLN: 250.0000 [IU] | Freq: Once | INTRAVENOUS | Status: DC | PRN

## 2022-03-31 MED ORDER — SODIUM CHLORIDE 0.9 % IV SOLN: Freq: Once | INTRAVENOUS | Status: DC

## 2022-03-31 MED ORDER — SODIUM CHLORIDE 0.9% FLUSH
10.0000 mL | Freq: Once | INTRAVENOUS | Status: AC
  Administered 2022-03-31: 10 mL

## 2022-03-31 MED ORDER — SODIUM CHLORIDE 0.9 % IV SOLN: INTRAVENOUS | Status: AC

## 2022-03-31 MED ORDER — SODIUM CHLORIDE 0.9% FLUSH: 3.0000 mL | Freq: Once | INTRAVENOUS | Status: DC | PRN

## 2022-03-31 NOTE — Patient Instructions (Signed)
Rehydration, Adult Rehydration is the replacement of fluids, salts, and minerals in the body (electrolytes) that are lost during dehydration. Dehydration is when there is not enough water or other fluids in the body. This happens when you lose more fluids than you take in. Common causes of dehydration include: Not drinking enough fluids. This can occur when you are ill or doing activities that require a lot of energy, especially in hot weather. Conditions that cause loss of water or other fluids. These include diarrhea, vomiting, sweating, and urinating a lot. Other illnesses, such as fever or infection. Certain medicines, such as those that remove excess fluid from the body (diuretics). Symptoms of mild or moderate dehydration may include thirst, dry lips and mouth, and dizziness. Symptoms of severe dehydration may include increased heart rate, confusion, fainting, and not urinating. In severe cases, you may need to get fluids through an IV at the hospital. For mild or moderate cases, you can usually rehydrate at home by drinking certain fluids as told by your health care provider. What are the risks? Your health care provider will talk with you about risks. Your health care provider will talk with you about risks. This may include taking in too much fluid (overhydration). This is rare. Overhydration can cause an imbalance of electrolytes in the body, kidney failure, or a decrease in salt (sodium) levels in the body. Supplies needed: You will need an oral rehydration solution (ORS) if your health care provider tells you to use one. This is a drink to treat dehydration. It can be found in pharmacies and retail stores. How to rehydrate Fluids Follow instructions from your health care provider about what to drink. The kind of fluid and the amount you should drink depend on your condition. In general, you should choose drinks that you prefer. If told by your health care provider, drink an ORS. Make an  ORS by following instructions on the package. Start by drinking small amounts, about  cup (120 mL) every 5-10 minutes. Slowly increase how much you drink until you have taken in the amount recommended by your health care provider. Drink enough clear fluids to keep your urine pale yellow. If you were told to drink an ORS, finish it first, then start slowly drinking other clear fluids. Drink fluids such as: Water. This includes sparkling and flavored water. Drinking only water can lead to having too little sodium in your body (hyponatremia). Follow the advice of your health care provider. Water from ice chips you suck on. Fruit juice with water added to it (diluted). Sports drinks. Hot or cold herbal teas. Broth-based soups. Milk or milk products. Food Follow instructions from your health care provider about what to eat while you rehydrate. Your health care provider may recommend that you slowly begin eating regular foods in small amounts. Eat foods that contain a healthy balance of electrolytes, such as bananas, oranges, potatoes, tomatoes, and spinach. Avoid foods that are greasy or contain a lot of sugar. In some cases, you may get nutrition through a feeding tube that is passed through your nose and into your stomach (nasogastric tube, or NG tube). This may be done if you have uncontrolled vomiting or diarrhea. Drinks to avoid  Certain drinks may make dehydration worse. While you rehydrate, avoid drinking alcohol. How to tell if you are recovering from dehydration You may be getting better if: You are urinating more often than before you started rehydrating. Your urine is pale yellow. Your energy level improves. You vomit less  often. You have diarrhea less often. Your appetite improves or returns to normal. You feel less dizzy or light-headed. Your skin tone and color start to look more normal. Follow these instructions at home: Take over-the-counter and prescription medicines only  as told by your health care provider. Do not take sodium tablets. Doing this can lead to having too much sodium in your body (hypernatremia). Contact a health care provider if: You continue to have symptoms of mild or moderate dehydration, such as: Thirst. Dry lips. Slightly dry mouth. Dizziness. Dark urine or less urine than normal. Muscle cramps. You continue to vomit or have diarrhea. Get help right away if: You have symptoms of dehydration that get worse. You have a fever. You have a severe headache. You have been vomiting and have problems, such as: Your vomiting gets worse or does not go away. Your vomit includes blood or green matter (bile). You cannot eat or drink without vomiting. You have problems with urination or bowel movements, such as: Diarrhea that gets worse or does not go away. Blood in your stool (feces). This may cause stool to look black and tarry. Not urinating, or urinating only a small amount of very dark urine, within 6-8 hours. You have trouble breathing. You have symptoms that get worse with treatment. These symptoms may be an emergency. Get help right away. Call 911. Do not wait to see if the symptoms will go away. Do not drive yourself to the hospital. This information is not intended to replace advice given to you by your health care provider. Make sure you discuss any questions you have with your health care provider. Potassium Chloride Injection What is this medication? POTASSIUM CHLORIDE (poe TASS i um KLOOR ide) prevents and treats low levels of potassium in your body. Potassium plays an important role in maintaining the health of your kidneys, heart, muscles, and nervous system. This medicine may be used for other purposes; ask your health care provider or pharmacist if you have questions. COMMON BRAND NAME(S): PROAMP What should I tell my care team before I take this medication? They need to know if you have any of these conditions: Addison  disease Dehydration Diabetes (high blood sugar) Heart disease High levels of potassium in the blood Irregular heartbeat or rhythm Kidney disease Large areas of burned skin An unusual or allergic reaction to potassium, other medications, foods, dyes, or preservatives Pregnant or trying to get pregnant Breast-feeding How should I use this medication? This medication is injected into a vein. It is given in a hospital or clinic setting. Talk to your care team about the use of this medication in children. Special care may be needed. Overdosage: If you think you have taken too much of this medicine contact a poison control center or emergency room at once. NOTE: This medicine is only for you. Do not share this medicine with others. What if I miss a dose? This does not apply. This medication is not for regular use. What may interact with this medication? Do not take this medication with any of the following: Certain diuretics, such as spironolactone, triamterene Eplerenone Sodium polystyrene sulfonate This medication may also interact with the following: Certain medications for blood pressure or heart disease, such as lisinopril, losartan, quinapril, valsartan Medications that lower your chance of fighting infection, such as cyclosporine, tacrolimus NSAIDs, medications for pain and inflammation, such as ibuprofen or naproxen Other potassium supplements Salt substitutes This list may not describe all possible interactions. Give your health care provider a  list of all the medicines, herbs, non-prescription drugs, or dietary supplements you use. Also tell them if you smoke, drink alcohol, or use illegal drugs. Some items may interact with your medicine. What should I watch for while using this medication? Visit your care team for regular checks on your progress. Tell your care team if your symptoms do not start to get better or if they get worse. You may need blood work while you are taking  this medication. Avoid salt substitutes unless you are told otherwise by your care team. What side effects may I notice from receiving this medication? Side effects that you should report to your care team as soon as possible: Allergic reactions--skin rash, itching, hives, swelling of the face, lips, tongue, or throat High potassium level--muscle weakness, fast or irregular heartbeat Side effects that usually do not require medical attention (report to your care team if they continue or are bothersome): Diarrhea Nausea Stomach pain Vomiting This list may not describe all possible side effects. Call your doctor for medical advice about side effects. You may report side effects to FDA at 1-800-FDA-1088. Where should I keep my medication? This medication is given in a hospital or clinic. It will not be stored at home. NOTE: This sheet is a summary. It may not cover all possible information. If you have questions about this medicine, talk to your doctor, pharmacist, or health care provider.  2023 Elsevier/Gold Standard (2020-06-02 00:00:00) Vitamin B12 Injection What is this medication? Vitamin B12 (VAHY tuh min B12) prevents and treats low vitamin B12 levels in your body. It is used in people who do not get enough vitamin B12 from their diet or when their digestive tract does not absorb enough. Vitamin B12 plays an important role in maintaining the health of your nervous system and red blood cells. This medicine may be used for other purposes; ask your health care provider or pharmacist if you have questions. COMMON BRAND NAME(S): B-12 Compliance Kit, B-12 Injection Kit, Cyomin, Dodex, LA-12, Nutri-Twelve, Physicians EZ Use B-12, Primabalt What should I tell my care team before I take this medication? They need to know if you have any of these conditions: Kidney disease Leber's disease Megaloblastic anemia An unusual or allergic reaction to cyanocobalamin, cobalt, other medications, foods,  dyes, or preservatives Pregnant or trying to get pregnant Breast-feeding How should I use this medication? This medication is injected into a muscle or deeply under the skin. It is usually given in a clinic or care team's office. However, your care team may teach you how to inject yourself. Follow all instructions. Talk to your care team about the use of this medication in children. Special care may be needed. Overdosage: If you think you have taken too much of this medicine contact a poison control center or emergency room at once. NOTE: This medicine is only for you. Do not share this medicine with others. What if I miss a dose? If you are given your dose at a clinic or care team's office, call to reschedule your appointment. If you give your own injections, and you miss a dose, take it as soon as you can. If it is almost time for your next dose, take only that dose. Do not take double or extra doses. What may interact with this medication? Alcohol Colchicine This list may not describe all possible interactions. Give your health care provider a list of all the medicines, herbs, non-prescription drugs, or dietary supplements you use. Also tell them if you smoke,  drink alcohol, or use illegal drugs. Some items may interact with your medicine. What should I watch for while using this medication? Visit your care team regularly. You may need blood work done while you are taking this medication. You may need to follow a special diet. Talk to your care team. Limit your alcohol intake and avoid smoking to get the best benefit. What side effects may I notice from receiving this medication? Side effects that you should report to your care team as soon as possible: Allergic reactions--skin rash, itching, hives, swelling of the face, lips, tongue, or throat Swelling of the ankles, hands, or feet Trouble breathing Side effects that usually do not require medical attention (report to your care team if they  continue or are bothersome): Diarrhea This list may not describe all possible side effects. Call your doctor for medical advice about side effects. You may report side effects to FDA at 1-800-FDA-1088. Where should I keep my medication? Keep out of the reach of children. Store at room temperature between 15 and 30 degrees C (59 and 85 degrees F). Protect from light. Throw away any unused medication after the expiration date. NOTE: This sheet is a summary. It may not cover all possible information. If you have questions about this medicine, talk to your doctor, pharmacist, or health care provider.  2023 Elsevier/Gold Standard (2007-04-12 00:00:00) Document Revised: 07/03/2021 Document Reviewed: 07/03/2021 Elsevier Patient Education  [place].

## 2022-04-11 ENCOUNTER — Ambulatory Visit: Payer: 59 | Admitting: Internal Medicine

## 2022-04-11 ENCOUNTER — Encounter: Payer: Self-pay | Admitting: Internal Medicine

## 2022-04-11 VITALS — BP 120/74 | HR 76 | Temp 98.9°F | Ht 69.0 in | Wt 295.4 lb

## 2022-04-11 DIAGNOSIS — J0100 Acute maxillary sinusitis, unspecified: Secondary | ICD-10-CM

## 2022-04-11 DIAGNOSIS — R0981 Nasal congestion: Secondary | ICD-10-CM

## 2022-04-11 DIAGNOSIS — R059 Cough, unspecified: Secondary | ICD-10-CM

## 2022-04-11 DIAGNOSIS — E7800 Pure hypercholesterolemia, unspecified: Secondary | ICD-10-CM

## 2022-04-11 DIAGNOSIS — I10 Essential (primary) hypertension: Secondary | ICD-10-CM | POA: Diagnosis not present

## 2022-04-11 DIAGNOSIS — Z6841 Body Mass Index (BMI) 40.0 and over, adult: Secondary | ICD-10-CM

## 2022-04-11 DIAGNOSIS — R7303 Prediabetes: Secondary | ICD-10-CM

## 2022-04-11 MED ORDER — HYDROCODONE BIT-HOMATROP MBR 5-1.5 MG/5ML PO SOLN: 5.0000 mL | Freq: Four times a day (QID) | ORAL | 0 refills | Status: DC | PRN

## 2022-04-11 MED ORDER — ZEPBOUND 5 MG/0.5ML ~~LOC~~ SOAJ: 5.0000 mg | SUBCUTANEOUS | 0 refills | Status: DC

## 2022-04-11 MED ORDER — AMOXICILLIN-POT CLAVULANATE 875-125 MG PO TABS: 1.0000 | ORAL_TABLET | Freq: Two times a day (BID) | ORAL | 0 refills | Status: DC

## 2022-04-11 MED ORDER — CETIRIZINE HCL 10 MG PO TABS: 10.0000 mg | ORAL_TABLET | Freq: Every day | ORAL | 2 refills | Status: DC

## 2022-04-11 NOTE — Patient Instructions (Signed)

## 2022-04-11 NOTE — Progress Notes (Signed)
I,[name],acting as a scribe for [name], MD.,have documented all relevant documentation on the behalf of [name], MD,as directed by  [name], MD while in the presence of [name], MD.    Subjective:     Patient ID: Pamela [name] , female    DOB: 1965-12-10 , 57 y.o.   MRN: [id number]   Chief Complaint  Patient presents with   URI    HPI  She presents today for further evaluation of cold symptoms.  She c/o congestion, cough & body aches. She has taken 3 at home COVID tests which were negative. Her symptoms initially started last Wednesday.  She denies having any known ill contacts.   URI  This is a new problem. The current episode started in the past 7 days. The problem has been waxing and waning. There has been no fever. Associated symptoms include congestion and coughing.     Past Medical History:  Diagnosis Date   Anxiety    Asthma    Back pain    Breast cancer ([place])    Constipation    DUB (dysfunctional uterine bleeding) 2007   Edema of both lower extremities    Elevated cholesterol    Family history of breast cancer 05/12/2020   Family history of lung cancer 05/12/2020   Food allergy    H/O menorrhagia 05/01/2006   High cholesterol    History of ovarian cyst 2007   Hypertension 03/31/2005   Increased BMI 07/11/2006   Joint pain    Migraine    N&V (nausea and vomiting)    Obesity 2007   Obstructive sleep apnea syndrome, moderate 10/27/2013   no cpap   Sleep apnea    SOB (shortness of breath)    Vitamin D deficiency    Vitamin D deficiency    Weight loss 07/11/2006     Family History  Problem Relation Age of Onset   Hypertension Mother    High Cholesterol Mother    Thyroid disease Mother    Cancer Mother    Sleep apnea Mother    Hypertension Father    Heart attack Father    Sudden death Father    Diabetes Maternal Grandmother    Bone cancer Maternal Grandfather        dx after 36   Breast cancer  Maternal Aunt        dx 41s   Lung cancer Maternal Aunt        dx after 50     Current Outpatient Medications:    amLODipine (NORVASC) 10 MG tablet, Take 1 tablet (10 mg total) by mouth daily., Disp: 90 tablet, Rfl: 2   amoxicillin-clavulanate (AUGMENTIN) 875-125 MG tablet, Take 1 tablet by mouth 2 (two) times daily., Disp: 20 tablet, Rfl: 0   aspirin EC 81 MG tablet, Take 1 tablet (81 mg total) by mouth daily. Swallow whole., Disp: 30 tablet, Rfl: 11   benzonatate (TESSALON PERLES) 100 MG capsule, Take 1 capsule (100 mg total) by mouth 3 (three) times daily as needed for cough., Disp: 30 capsule, Rfl: 1   cetirizine (ZYRTEC ALLERGY) 10 MG tablet, Take 1 tablet (10 mg total) by mouth daily., Disp: 30 tablet, Rfl: 2   EDARBYCLOR 40-25 MG TABS, Take 1 tablet by mouth daily., Disp: 90 tablet, Rfl: 2   HYDROcodone bit-homatropine (HYDROMET) 5-1.5 MG/5ML syrup, Take 5 mLs by mouth every 6 (six) hours as needed., Disp: 120 mL, Rfl: 0   levonorgestrel (  MIRENA, 52 MG,) 20 MCG/DAY IUD, 1 each by Intrauterine route once., Disp: , Rfl:    lidocaine-prilocaine (EMLA) cream, Apply 1 application topically daily as needed., Disp: 60 g, Rfl: 0   LORazepam (ATIVAN) 0.5 MG tablet, Take 1 tablet (0.5 mg total) by mouth every 8 (eight) hours as needed for anxiety., Disp: 30 tablet, Rfl: 0   potassium chloride SA (KLOR-CON M) 20 MEQ tablet, Take 1 tablet (20 mEq total) by mouth daily., Disp: 30 tablet, Rfl: 0   prochlorperazine (COMPAZINE) 10 MG tablet, Take 1 tablet (10 mg total) by mouth every 6 (six) hours as needed (Nausea or vomiting)., Disp: 30 tablet, Rfl: 1   rosuvastatin (CRESTOR) 40 MG tablet, Take 1 tablet (40 mg total) by mouth daily., Disp: 90 tablet, Rfl: 3   tirzepatide (ZEPBOUND) 5 MG/0.5ML Pen, Inject 5 mg into the skin once a week., Disp: 2 mL, Rfl: 0 No current facility-administered medications for this visit.  Facility-Administered Medications Ordered in Other Visits:    heparin lock flush  100 unit/mL, 500 Units, Intracatheter, Once, Gudena, Vinay, MD   sodium chloride flush (NS) 0.9 % injection 10 mL, 10 mL, Intracatheter, Once, Nicholas Lose, MD   Allergies  Allergen Reactions   Pollen Extract Shortness Of Breath   Compazine [Prochlorperazine] Anxiety    IV compazine causes severe anxiety attack   Latex Rash   Codeine Hives   Diflucan [Fluconazole] Hives   Shellfish Allergy Hives   Betadine [Povidone Iodine] Rash     Review of Systems  Constitutional: Negative.   HENT:  Positive for congestion.   Respiratory:  Positive for cough.   Cardiovascular: Negative.   Gastrointestinal: Negative.   Neurological: Negative.   Psychiatric/Behavioral: Negative.       Today's Vitals   04/11/22 1147  BP: 120/74  Pulse: 76  Temp: 98.9 F (37.2 C)  SpO2: 98%  Weight: 295 lb 6.4 oz (134 kg)  Height: '5\' 9"'$  (1.753 m)   Body mass index is 43.62 kg/m.  Wt Readings from Last 3 Encounters:  04/26/22 297 lb 9.6 oz (135 kg)  04/11/22 295 lb 6.4 oz (134 kg)  03/30/22 (!) 301 lb 8 oz (136.8 kg)    Objective:  Physical Exam Vitals and nursing note reviewed.  Constitutional:      Appearance: Normal appearance. She is obese. She is ill-appearing.  HENT:     Head: Normocephalic and atraumatic.     Comments: Maxillary tenderness to percussion    Nose:     Comments: Masked     Mouth/Throat:     Pharynx: Posterior oropharyngeal erythema present.  Eyes:     Extraocular Movements: Extraocular movements intact.  Cardiovascular:     Rate and Rhythm: Normal rate and regular rhythm.     Heart sounds: Normal heart sounds.  Pulmonary:     Effort: Pulmonary effort is normal.     Breath sounds: Normal breath sounds.  Musculoskeletal:     Cervical back: Normal range of motion.  Skin:    General: Skin is warm.  Neurological:     General: No focal deficit present.     Mental Status: She is alert.  Psychiatric:        Mood and Affect: Mood normal.        Behavior: Behavior  normal.         Assessment And Plan:     1. Acute non-recurrent maxillary sinusitis Comments: I will check resp panel. I will send rx Augmentin, encouraged to  take full course. Also adivsed to avoid dairy products. PDMP reviewed, rx hydromet prn. - Respiratory Panel w/ SARS-CoV2  2. Essential hypertension Comments: Chronic, well controlled. No med changes today. Due to underlying HTN, she should take OTC antihistamine or Coricidin prn for congestion.  3. Class 3 severe obesity due to excess calories with serious comorbidity and body mass index (BMI) of 40.0 to 44.9 in adult [facility]) She is encouraged to strive for BMI less than 30 to decrease cardiac risk. Advised to aim for at least 150 minutes of exercise per week.  Will send rx Zepbound to see if covered by her insurance. No family h/o thyroid cancer. Once approved, she will rto to learn how to self administer the medication.    Patient was given opportunity to ask questions. Patient verbalized understanding of the plan and was able to repeat key elements of the plan. All questions were answered to their satisfaction.   I, [name], MD, have reviewed all documentation for this visit. The documentation on 05/01/22 for the exam, diagnosis, procedures, and orders are all accurate and complete.   IF YOU HAVE BEEN REFERRED TO A SPECIALIST, IT MAY TAKE 1-2 WEEKS TO SCHEDULE/PROCESS THE REFERRAL. IF YOU HAVE NOT HEARD FROM US/SPECIALIST IN TWO WEEKS, PLEASE GIVE [place] A CALL AT [phone] X [phone].   THE PATIENT IS ENCOURAGED TO PRACTICE SOCIAL DISTANCING DUE TO THE COVID-19 PANDEMIC.

## 2022-04-14 LAB — RESPIRATORY PANEL W/ SARS-COV2

## 2022-04-16 ENCOUNTER — Telehealth: Payer: Self-pay

## 2022-04-16 NOTE — Telephone Encounter (Signed)
Pt called and states she has a sinus infection being treated with abx. RVP negative. She states she is feeling better but is still suffering from cough. Pt asks if she should come in 2/16 as scheduled. Advised pt to come in as scheduled since she is on abx and was negative for resp. Panel. She verbalized understanding,.

## 2022-04-19 MED FILL — Fosaprepitant Dimeglumine For IV Infusion 150 MG (Base Eq): INTRAVENOUS | Qty: 5 | Status: AC

## 2022-04-19 NOTE — Progress Notes (Incomplete)
Patient Care Team: [name], MD as PCP - General (Internal Medicine) Mcarthur Rossetti, MD as Consulting Physician (Orthopedic Surgery) Mauro Kaufmann, RN as Oncology Nurse Navigator Rockwell Germany, RN as Oncology Nurse Navigator [name], MD as Consulting Physician (General Surgery) Nicholas Lose, MD as Consulting Physician (Hematology and Oncology) Eppie Gibson, MD as Attending Physician (Radiation Oncology)  DIAGNOSIS: No diagnosis found.  SUMMARY OF ONCOLOGIC HISTORY: Oncology History  Malignant neoplasm of lower-outer quadrant of right breast of female, estrogen receptor positive ([place])  05/03/2020 Initial Diagnosis   Screening mammogram showed a 1.0cm mass at the 6 o'clock position in the right breast. Diagnostic mammogram and US showed the 1.2cm mass at the 6 o'clock position in the right breast. Biopsy showed IDC, grade 3, HER-2 positive (3+), ER 15% weak, PR-, Ki67 80%.   05/11/2020 Cancer Staging   Staging form: Breast, AJCC 8th Edition - Clinical stage from 05/11/2020: Stage IA (cT1b, cN0, cM0, G3, ER+, PR-, HER2+) - Signed by Nicholas Lose, MD on 05/11/2020 Stage prefix: Initial diagnosis   05/19/2020 Genetic Testing   Negative hereditary cancer genetic testing: no pathogenic variants detected in [facility] Breast STAT Panel and Common Hereditary Cancers Panel.  The report dates are May 19, 2020 (STAT) and May 23, 2020 (Common Hereditary).   The Common Hereditary Cancers Panel offered by [facility] includes sequencing and/or deletion duplication testing of the following 47 genes: APC, ATM, AXIN2, BARD1, BMPR1A, BRCA1, BRCA2, BRIP1, CDH1, CDK4, CDKN2A (p14ARF), CDKN2A (p16INK4a), CHEK2, CTNNA1, DICER1, EPCAM (Deletion/duplication testing only), GREM1 (promoter region deletion/duplication testing only), GREM1, HOXB13, KIT, MEN1, MLH1, MSH2, MSH3, MSH6, MUTYH, NBN, NF1, NHTL1, PALB2, PDGFRA, PMS2, POLD1, POLE, PTEN, RAD50, RAD51C, RAD51D, SDHA, SDHB, SDHC, SDHD,  SMAD4, SMARCA4. STK11, TP53, TSC1, TSC2, and VHL.  The following genes were evaluated for sequence changes only: SDHA and HOXB13 c.251G>A variant only.es only: SDHA and HOXB13 c.251G>A variant only.   06/02/2020 Surgery   Right lumpectomy ([facility]): invasive and in situ ductal carcinoma, 1.3cm, clear margins, 1 right axillary lymph node negative for carcinoma.   06/02/2020 Cancer Staging   Staging form: Breast, AJCC 8th Edition - Pathologic stage from 06/02/2020: Stage IA (pT1c, pN0, cM0, G3, ER+, PR-, HER2+) - Signed by Gardenia Phlegm, NP on 06/15/2020 Stage prefix: Initial diagnosis Histologic grading system: 3 grade system   07/08/2020 - 10/21/2020 Chemotherapy   Taxol Herceptin followed by Herceptin maintenance   11/11/2020 -  Chemotherapy   Herceptin maintenance       04/14/2021 -  Anti-estrogen oral therapy   Tamoxifen 10 mg   08/21/2021 - 10/26/2021 Chemotherapy   Patient is on Treatment Plan : BREAST METASTATIC fam-trastuzumab deruxtecan-nxki (Enhertu) q21d     11/15/2021 -  Chemotherapy   Patient is on Treatment Plan : BREAST METASTATIC Fam-Trastuzumab Deruxtecan-nxki (Enhertu) (5.4) q21d       CHIEF COMPLIANT:  Follow-up metastatic breast cancer      INTERVAL HISTORY: Pamela [name] is a 57 y.o. with the above-mentioned metastatic breast cancer currently on treatment with Enhertu. She presents to the clinic for a follow-up.   ALLERGIES:  is allergic to pollen extract, compazine [prochlorperazine], latex, codeine, diflucan [fluconazole], shellfish allergy, and betadine [povidone iodine].  MEDICATIONS:  Current Outpatient Medications  Medication Sig Dispense Refill   amLODipine (NORVASC) 10 MG tablet Take 1 tablet (10 mg total) by mouth daily. 90 tablet 2   amoxicillin-clavulanate (AUGMENTIN) 875-125 MG tablet Take 1 tablet by mouth 2 (two) times daily. 20 tablet 0  aspirin EC 81 MG tablet Take 1 tablet (81 mg total) by mouth daily. Swallow whole. 30 tablet 11    benzonatate (TESSALON PERLES) 100 MG capsule Take 1 capsule (100 mg total) by mouth 3 (three) times daily as needed for cough. 30 capsule 1   cetirizine (ZYRTEC ALLERGY) 10 MG tablet Take 1 tablet (10 mg total) by mouth daily. 30 tablet 2   EDARBYCLOR 40-25 MG TABS Take 1 tablet by mouth daily. 90 tablet 2   HYDROcodone bit-homatropine (HYDROMET) 5-1.5 MG/5ML syrup Take 5 mLs by mouth every 6 (six) hours as needed. 120 mL 0   levonorgestrel (MIRENA, 52 MG,) 20 MCG/DAY IUD 1 each by Intrauterine route once.     lidocaine-prilocaine (EMLA) cream Apply 1 application topically daily as needed. 60 g 0   LORazepam (ATIVAN) 0.5 MG tablet Take 1 tablet (0.5 mg total) by mouth every 8 (eight) hours as needed for anxiety. 30 tablet 0   potassium chloride SA (KLOR-CON M) 20 MEQ tablet Take 1 tablet (20 mEq total) by mouth daily. 30 tablet 0   prochlorperazine (COMPAZINE) 10 MG tablet Take 1 tablet (10 mg total) by mouth every 6 (six) hours as needed (Nausea or vomiting). 30 tablet 1   rosuvastatin (CRESTOR) 40 MG tablet Take 1 tablet (40 mg total) by mouth daily. 90 tablet 3   tirzepatide (ZEPBOUND) 5 MG/0.5ML Pen Inject 5 mg into the skin once a week. 2 mL 0   No current facility-administered medications for this visit.   Facility-Administered Medications Ordered in Other Visits  Medication Dose Route Frequency Provider Last Rate Last Admin   heparin lock flush 100 unit/mL  500 Units Intracatheter Once Nicholas Lose, MD       sodium chloride flush (NS) 0.9 % injection 10 mL  10 mL Intracatheter Once Nicholas Lose, MD        PHYSICAL EXAMINATION: ECOG PERFORMANCE STATUS: {CHL ONC ECOG [id number]  There were no vitals filed for this visit. There were no vitals filed for this visit.  BREAST:*** No palpable masses or nodules in either right or left breasts. No palpable axillary supraclavicular or infraclavicular adenopathy no breast tenderness or nipple discharge. (exam performed in the presence of a  chaperone)  LABORATORY DATA:  I have reviewed the data as listed    Latest Ref Rng & Units 03/30/2022    8:41 AM 03/08/2022    9:12 AM 02/08/2022    9:36 AM  CMP  Glucose 70 - 99 mg/dL 99  93  102   BUN 6 - 20 mg/dL 12  7  5   $ Creatinine 0.44 - 1.00 mg/dL 0.72  0.72  0.64   Sodium 135 - 145 mmol/L 142  143  143   Potassium 3.5 - 5.1 mmol/L 3.4  2.8  3.1   Chloride 98 - 111 mmol/L 108  106  106   CO2 22 - 32 mmol/L 29  30  31   $ Calcium 8.9 - 10.3 mg/dL 9.2  9.3  9.5   Total Protein 6.5 - 8.1 g/dL 6.4  6.3  6.6   Total Bilirubin 0.3 - 1.2 mg/dL 0.8  0.8  0.8   Alkaline Phos 38 - 126 U/L 81  74  77   AST 15 - 41 U/L 23  19  22   $ ALT 0 - 44 U/L 12  9  11     $ Lab Results  Component Value Date   WBC 4.2 03/30/2022   HGB 10.6 (L) 03/30/2022  HCT 32.6 (L) 03/30/2022   MCV 87.2 03/30/2022   PLT 197 03/30/2022   NEUTROABS 2.4 03/30/2022    ASSESSMENT & PLAN:  No problem-specific Assessment & Plan notes found for this encounter.    No orders of the defined types were placed in this encounter.  The patient has a good understanding of the overall plan. she agrees with it. she will call with any problems that may develop before the next visit here. Total time spent: 30 mins including face to face time and time spent for planning, charting and co-ordination of care   Suzzette Righter, [place] 04/19/22    I Gardiner Coins am acting as a Education administrator for [employer]  ***

## 2022-04-19 NOTE — Assessment & Plan Note (Deleted)
05/03/2020:Screening mammogram showed a 1.0cm mass at the 6 o'clock position in the right breast. Diagnostic mammogram and US showed the 1.2cm mass at the 6 o'clock position in the right breast. Biopsy showed IDC, grade 3, HER-2 positive (3+), ER 15% weak, PR-, Ki67 80%.   06/02/2020:Right lumpectomy ([facility]): invasive and in situ ductal carcinoma, 1.3cm, clear margins, 1 right axillary lymph node negative for carcinoma. ER 15% weak, PR-,HER-2 positive (3+) Ki67 80%.   Treatment plan: 1.  Adjuvant chemotherapy with Taxol Herceptin followed by Herceptin maintenance.  Stopped after 11 cycles of Taxol Herceptin maintenance completed 07/07/2021  2.  Adjuvant radiation therapy 11/23/2020-12/19/2020 3.  Adjuvant antiestrogen therapy with tamoxifen started October 2022 4.  CT angiogram 07/13/2021: Right supraclavicular lymph node 5 cm, right internal mammary lymph nodes 1.8 cm 5.  Metastatic breast cancer: June 2023:Right supraclavicular lymphadenopathy: Biopsy: Metastatic poorly differentiated adenocarcinoma, ER +40%, PR negative, HER2 positive 3+ PET CT scan 08/06/21: Ext Right Supra clav LN along with Internal mammary and Rt Upper Mediastinal and Rt Axillary LN -----------------------------------------------------------------------------------------------------  Current treatment: Enhertu started on 08/21/2021, (this treatment on hold) today is cycle 10 Enhertu toxicities: 1,.  Severe nausea, requiring IV fluids: Added Emend to the regimen, she is doing significantly better 2. Chemotherapy-induced anemia: Monitoring closely, hemoglobin 11.2 3.  Hospitalization 11/26/2021-11/28/2021: Dizziness and fatigue, esophageal dysmotility   Because of her ongoing issues with hypokalemia and diarrhea: We will add IV fluids with potassium on Saturday    CT CAP 03/06/2022: Marked shrinkage of extensive lymph nodes in the neck and mediastinum.  Most of them are now subcentimeter in size.   Our plan is to do 3 more cycles of  Enhertu and perform a PET CT scan. Return to clinic every 3 weeks for [facility]

## 2022-04-20 ENCOUNTER — Telehealth: Payer: Self-pay | Admitting: Hematology and Oncology

## 2022-04-20 ENCOUNTER — Inpatient Hospital Stay: Payer: 59

## 2022-04-20 ENCOUNTER — Telehealth: Payer: Self-pay | Admitting: *Deleted

## 2022-04-20 ENCOUNTER — Inpatient Hospital Stay: Payer: 59 | Admitting: Hematology and Oncology

## 2022-04-20 DIAGNOSIS — C50511 Malignant neoplasm of lower-outer quadrant of right female breast: Secondary | ICD-10-CM

## 2022-04-20 NOTE — Telephone Encounter (Signed)
Scheduled appointments per 2/16 secure chat. Patient is aware of all made appointments.

## 2022-04-20 NOTE — Telephone Encounter (Signed)
Received call from pt requesting to cancel appt for today.  Pt states she started experiencing severe tooth pain last night and is not feeling well.  Pt states she will f/u with [facility] today and would like to reschedule appts to next week.  High priority message sent to scheduling to reschedule today's appts and to push out future appts.

## 2022-04-21 ENCOUNTER — Inpatient Hospital Stay: Payer: 59

## 2022-04-21 ENCOUNTER — Ambulatory Visit: Payer: 59

## 2022-04-24 ENCOUNTER — Telehealth: Payer: Self-pay | Admitting: Hematology and Oncology

## 2022-04-24 NOTE — Telephone Encounter (Signed)
Rescheduled appointment per patient. Patient is aware of the changes made to her upcoming appointment.

## 2022-04-25 MED FILL — Fosaprepitant Dimeglumine For IV Infusion 150 MG (Base Eq): INTRAVENOUS | Qty: 5 | Status: AC

## 2022-04-26 ENCOUNTER — Inpatient Hospital Stay: Payer: 59

## 2022-04-26 ENCOUNTER — Other Ambulatory Visit: Payer: Self-pay

## 2022-04-26 ENCOUNTER — Inpatient Hospital Stay: Payer: 59 | Attending: Hematology and Oncology

## 2022-04-26 ENCOUNTER — Inpatient Hospital Stay (HOSPITAL_BASED_OUTPATIENT_CLINIC_OR_DEPARTMENT_OTHER): Payer: 59 | Admitting: Adult Health

## 2022-04-26 ENCOUNTER — Encounter: Payer: Self-pay | Admitting: Adult Health

## 2022-04-26 ENCOUNTER — Inpatient Hospital Stay: Payer: 59 | Admitting: Licensed Clinical Social Worker

## 2022-04-26 VITALS — BP 128/71 | HR 64 | Temp 97.8°F | Resp 19 | Ht 69.0 in | Wt 297.6 lb

## 2022-04-26 DIAGNOSIS — Z801 Family history of malignant neoplasm of trachea, bronchus and lung: Secondary | ICD-10-CM | POA: Insufficient documentation

## 2022-04-26 DIAGNOSIS — Z808 Family history of malignant neoplasm of other organs or systems: Secondary | ICD-10-CM | POA: Diagnosis not present

## 2022-04-26 DIAGNOSIS — E876 Hypokalemia: Secondary | ICD-10-CM | POA: Insufficient documentation

## 2022-04-26 DIAGNOSIS — Z5112 Encounter for antineoplastic immunotherapy: Secondary | ICD-10-CM | POA: Diagnosis present

## 2022-04-26 DIAGNOSIS — Z8349 Family history of other endocrine, nutritional and metabolic diseases: Secondary | ICD-10-CM | POA: Diagnosis not present

## 2022-04-26 DIAGNOSIS — N6315 Unspecified lump in the right breast, overlapping quadrants: Secondary | ICD-10-CM | POA: Diagnosis not present

## 2022-04-26 DIAGNOSIS — G4733 Obstructive sleep apnea (adult) (pediatric): Secondary | ICD-10-CM | POA: Insufficient documentation

## 2022-04-26 DIAGNOSIS — Z5986 Financial insecurity: Secondary | ICD-10-CM | POA: Insufficient documentation

## 2022-04-26 DIAGNOSIS — R42 Dizziness and giddiness: Secondary | ICD-10-CM | POA: Insufficient documentation

## 2022-04-26 DIAGNOSIS — Z803 Family history of malignant neoplasm of breast: Secondary | ICD-10-CM | POA: Insufficient documentation

## 2022-04-26 DIAGNOSIS — Z8249 Family history of ischemic heart disease and other diseases of the circulatory system: Secondary | ICD-10-CM | POA: Insufficient documentation

## 2022-04-26 DIAGNOSIS — Z825 Family history of asthma and other chronic lower respiratory diseases: Secondary | ICD-10-CM | POA: Insufficient documentation

## 2022-04-26 DIAGNOSIS — Z170 Estrogen receptor positive status [ER+]: Secondary | ICD-10-CM

## 2022-04-26 DIAGNOSIS — C50511 Malignant neoplasm of lower-outer quadrant of right female breast: Secondary | ICD-10-CM | POA: Insufficient documentation

## 2022-04-26 DIAGNOSIS — Z809 Family history of malignant neoplasm, unspecified: Secondary | ICD-10-CM | POA: Diagnosis not present

## 2022-04-26 DIAGNOSIS — E7800 Pure hypercholesterolemia, unspecified: Secondary | ICD-10-CM | POA: Diagnosis not present

## 2022-04-26 DIAGNOSIS — C50919 Malignant neoplasm of unspecified site of unspecified female breast: Secondary | ICD-10-CM

## 2022-04-26 DIAGNOSIS — I10 Essential (primary) hypertension: Secondary | ICD-10-CM | POA: Diagnosis not present

## 2022-04-26 DIAGNOSIS — Z95828 Presence of other vascular implants and grafts: Secondary | ICD-10-CM

## 2022-04-26 DIAGNOSIS — Z833 Family history of diabetes mellitus: Secondary | ICD-10-CM | POA: Diagnosis not present

## 2022-04-26 DIAGNOSIS — R110 Nausea: Secondary | ICD-10-CM

## 2022-04-26 DIAGNOSIS — Z79899 Other long term (current) drug therapy: Secondary | ICD-10-CM | POA: Diagnosis not present

## 2022-04-26 DIAGNOSIS — I700 Atherosclerosis of aorta: Secondary | ICD-10-CM

## 2022-04-26 LAB — CMP (CANCER CENTER ONLY)
ALT: 12 U/L (ref 0–44)
AST: 25 U/L (ref 15–41)
Albumin: 3.4 g/dL — ABNORMAL LOW (ref 3.5–5.0)
Alkaline Phosphatase: 81 U/L (ref 38–126)
Anion gap: 6 (ref 5–15)
BUN: 10 mg/dL (ref 6–20)
CO2: 30 mmol/L (ref 22–32)
Calcium: 8.8 mg/dL — ABNORMAL LOW (ref 8.9–10.3)
Chloride: 106 mmol/L (ref 98–111)
Creatinine: 0.62 mg/dL (ref 0.44–1.00)
GFR, Estimated: >60 mL/min (ref >60)
Glucose, Bld: 97 mg/dL (ref 70–99)
Potassium: 3.3 mmol/L — ABNORMAL LOW (ref 3.5–5.1)
Sodium: 142 mmol/L (ref 135–145)
Total Bilirubin: 0.8 mg/dL (ref 0.3–1.2)
Total Protein: 6.6 g/dL (ref 6.5–8.1)

## 2022-04-26 LAB — CBC WITH DIFFERENTIAL (CANCER CENTER ONLY)
Abs Immature Granulocytes: 0.01 10*3/uL (ref 0.00–0.07)
Basophils Absolute: 0 10*3/uL (ref 0.0–0.1)
Basophils Relative: 1 %
Eosinophils Absolute: 0.3 10*3/uL (ref 0.0–0.5)
Eosinophils Relative: 6 %
HCT: 33.7 % — ABNORMAL LOW (ref 36.0–46.0)
Hemoglobin: 11.1 g/dL — ABNORMAL LOW (ref 12.0–15.0)
Immature Granulocytes: 0 %
Lymphocytes Relative: 28 %
Lymphs Abs: 1.2 10*3/uL (ref 0.7–4.0)
MCH: 28.8 pg (ref 26.0–34.0)
MCHC: 32.9 g/dL (ref 30.0–36.0)
MCV: 87.5 fL (ref 80.0–100.0)
Monocytes Absolute: 0.6 10*3/uL (ref 0.1–1.0)
Monocytes Relative: 15 %
Neutro Abs: 2.1 10*3/uL (ref 1.7–7.7)
Neutrophils Relative %: 50 %
Platelet Count: 232 10*3/uL (ref 150–400)
RBC: 3.85 MIL/uL — ABNORMAL LOW (ref 3.87–5.11)
RDW: 17 % — ABNORMAL HIGH (ref 11.5–15.5)
WBC Count: 4.1 10*3/uL (ref 4.0–10.5)
nRBC: 0 % (ref 0.0–0.2)

## 2022-04-26 MED ORDER — SODIUM CHLORIDE 0.9% FLUSH
10.0000 mL | INTRAVENOUS | Status: DC | PRN
  Administered 2022-04-26: 10 mL

## 2022-04-26 MED ORDER — SODIUM CHLORIDE 0.9% FLUSH
10.0000 mL | Freq: Once | INTRAVENOUS | Status: AC
  Administered 2022-04-26: 10 mL

## 2022-04-26 MED ORDER — SODIUM CHLORIDE 0.9 % IV SOLN: Freq: Once | INTRAVENOUS | Status: AC

## 2022-04-26 MED ORDER — SODIUM CHLORIDE 0.9 % IV SOLN
150.0000 mg | Freq: Once | INTRAVENOUS | Status: AC
  Administered 2022-04-26: 150 mg via INTRAVENOUS
  Filled 2022-04-26: qty 5
  Filled 2022-04-26: qty 150

## 2022-04-26 MED ORDER — CYANOCOBALAMIN 1000 MCG/ML IJ SOLN
1000.0000 ug | Freq: Once | INTRAMUSCULAR | Status: AC
  Administered 2022-04-26: 1000 ug via INTRAMUSCULAR
  Filled 2022-04-26: qty 1

## 2022-04-26 MED ORDER — ACETAMINOPHEN 325 MG PO TABS
650.0000 mg | ORAL_TABLET | Freq: Once | ORAL | Status: AC
  Administered 2022-04-26: 650 mg via ORAL
  Filled 2022-04-26: qty 2

## 2022-04-26 MED ORDER — POTASSIUM CHLORIDE 10 MEQ/100ML IV SOLN
10.0000 meq | Freq: Once | INTRAVENOUS | Status: AC
  Administered 2022-04-26: 10 meq via INTRAVENOUS
  Filled 2022-04-26: qty 100

## 2022-04-26 MED ORDER — DEXTROSE 5 % IV SOLN: Freq: Once | INTRAVENOUS | Status: AC

## 2022-04-26 MED ORDER — FAM-TRASTUZUMAB DERUXTECAN-NXKI CHEMO 100 MG IV SOLR
3.2000 mg/kg | Freq: Once | INTRAVENOUS | Status: AC
  Administered 2022-04-26: 400 mg via INTRAVENOUS
  Filled 2022-04-26: qty 20

## 2022-04-26 MED ORDER — HEPARIN SOD (PORK) LOCK FLUSH 100 UNIT/ML IV SOLN
500.0000 [IU] | Freq: Once | INTRAVENOUS | Status: AC | PRN
  Administered 2022-04-26: 500 [IU]

## 2022-04-26 MED ORDER — PALONOSETRON HCL INJECTION 0.25 MG/5ML
0.2500 mg | Freq: Once | INTRAVENOUS | Status: AC
  Administered 2022-04-26: 0.25 mg via INTRAVENOUS
  Filled 2022-04-26: qty 5

## 2022-04-26 NOTE — Progress Notes (Signed)
[place] CSW Progress Note  Holiday representative met with patient to follow-up on financial resources. Discussed various foundations that may be able to offer assistance.  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 She is otherwise without income currently as she has utilized her sick and vacation time and is not allowed to have any more donated from coworkers.   CSW will see pt at next infusion in 3 weeks.    Pamela Aguinaldo E Dyami Umbach, LCSW

## 2022-04-26 NOTE — Progress Notes (Signed)
[facility] Follow up:    Pamela [name], Pamela [name] [place] [address]   DIAGNOSIS:  Cancer Staging  Malignant neoplasm of lower-outer quadrant of right breast of female, estrogen receptor positive ([place]) Staging form: Breast, AJCC 8th Edition - Clinical stage from 05/11/2020: Stage IA (cT1b, cN0, cM0, G3, ER+, PR-, HER2+) - Signed by Pamela Lose, MD on 05/11/2020 Stage prefix: Initial diagnosis - Pathologic stage from 06/02/2020: Stage IA (pT1c, pN0, cM0, G3, ER+, PR-, HER2+) - Signed by Pamela Phlegm, NP on 06/15/2020 Stage prefix: Initial diagnosis Histologic grading system: 3 grade system   SUMMARY OF ONCOLOGIC HISTORY: Oncology History  Malignant neoplasm of lower-outer quadrant of right breast of female, estrogen receptor positive ([place])  05/03/2020 Initial Diagnosis   Screening mammogram showed a 1.0cm mass at the 6 o'clock position in the right breast. Diagnostic mammogram and US showed the 1.2cm mass at the 6 o'clock position in the right breast. Biopsy showed IDC, grade 3, HER-2 positive (3+), ER 15% weak, PR-, Ki67 80%.   05/11/2020 Cancer Staging   Staging form: Breast, AJCC 8th Edition - Clinical stage from 05/11/2020: Stage IA (cT1b, cN0, cM0, G3, ER+, PR-, HER2+) - Signed by Pamela Lose, MD on 05/11/2020 Stage prefix: Initial diagnosis   05/19/2020 Genetic Testing   Negative hereditary cancer genetic testing: no pathogenic variants detected in [facility] Breast STAT Panel and Common Hereditary Cancers Panel.  The report dates are May 19, 2020 (STAT) and May 23, 2020 (Common Hereditary).   The Common Hereditary Cancers Panel offered by [facility] includes sequencing and/or deletion duplication testing of the following 47 genes: APC, ATM, AXIN2, BARD1, BMPR1A, BRCA1, BRCA2, BRIP1, CDH1, CDK4, CDKN2A (p14ARF), CDKN2A (p16INK4a), CHEK2, CTNNA1, DICER1, EPCAM (Deletion/duplication testing only), GREM1 (promoter region  deletion/duplication testing only), GREM1, HOXB13, KIT, MEN1, MLH1, MSH2, MSH3, MSH6, MUTYH, NBN, NF1, NHTL1, PALB2, PDGFRA, PMS2, POLD1, POLE, PTEN, RAD50, RAD51C, RAD51D, SDHA, SDHB, SDHC, SDHD, SMAD4, SMARCA4. STK11, TP53, TSC1, TSC2, and VHL.  The following genes were evaluated for sequence changes only: SDHA and HOXB13 c.251G>A variant only.es only: SDHA and HOXB13 c.251G>A variant only.   06/02/2020 Surgery   Right lumpectomy (Pamela [name]): invasive and in situ ductal carcinoma, 1.3cm, clear margins, 1 right axillary lymph node negative for carcinoma.   06/02/2020 Cancer Staging   Staging form: Breast, AJCC 8th Edition - Pathologic stage from 06/02/2020: Stage IA (pT1c, pN0, cM0, G3, ER+, PR-, HER2+) - Signed by Pamela Phlegm, NP on 06/15/2020 Stage prefix: Initial diagnosis Histologic grading system: 3 grade system   07/08/2020 - 10/21/2020 Chemotherapy   Taxol Herceptin followed by Herceptin maintenance   11/11/2020 -  Chemotherapy   Herceptin maintenance       04/14/2021 -  Anti-estrogen oral therapy   Tamoxifen 10 mg   08/21/2021 - 10/26/2021 Chemotherapy   Patient is on Treatment Plan : BREAST METASTATIC fam-trastuzumab deruxtecan-nxki (Enhertu) q21d     11/15/2021 -  Chemotherapy   Patient is on Treatment Plan : BREAST METASTATIC Fam-Trastuzumab Deruxtecan-nxki (Enhertu) (5.4) q21d       CURRENT THERAPY: Enhertu  INTERVAL HISTORY: Pamela [name] 57 y.o. female returns for follow-up while undergoing treatment with Enhertu for her metastatic breast cancer.  Her most recent echocardiogram occurred on March 28, 2022 demonstrating a left ventricular ejection fraction of 55 to 60%.  Her left ventricular global longitudinal strain was -18% which was normal. She was seen by Pamela [name] on March 28, 2022 who is following  her echocardiograms.   She underwent restaging CT chest abdomen pelvis on March 08, 2022 that demonstrated no new or progressive findings in the chest  abdomen or pelvis, signs of right breast lumpectomy with areas of central fat necrosis unchanged from previous imaging.  Stable small lymph nodes throughout the chest largest area of soft tissue about surgical clips in the right axilla.  Stable appearance of thoracic inlet lymph nodes not shown to be hypermetabolic on prior pet imaging.  Aortic atherosclerosis was noted.    She had a URI a couple of weeks ago and is finishing up an Augmentin prescription.  She is tolerating this well.  She has no fever or chills today.     Patient Active Problem List   Diagnosis Date Noted   Aortic atherosclerosis ([place]) 04/26/2022   Acute cough 01/15/2022   Hypomagnesemia 11/26/2021   Hypokalemia 11/26/2021   Dizziness, fatigue, globus sensation 11/26/2021   Breast cancer ([place]) 08/25/2021   Nausea without vomiting 08/25/2021   Prediabetes 03/13/2021   Non-intractable vomiting 07/13/2020   Port-A-Cath in place 07/08/2020   Genetic testing 05/20/2020   Family history of breast cancer 05/12/2020   Family history of lung cancer 05/12/2020   Malignant neoplasm of lower-outer quadrant of right breast of female, estrogen receptor positive ([place]) 05/03/2020   Elevated troponin level not due myocardial infarction 09/18/2019   Mixed hyperlipidemia 09/18/2019   Hypertensive emergency 09/17/2019   Primary osteoarthritis of right hip 08/06/2018   Seasonal allergies 06/12/2018   Fibrocystic disease of breast 05/27/2018   Obstructive sleep apnea syndrome, moderate 10/27/2013   Shift work sleep disorder 10/27/2013   Psychic factors associated with diseases classified elsewhere 08/07/2013   High cholesterol 07/21/2013   Essential hypertension 07/21/2013   Snoring 07/21/2013   IUD contraception-inserted 10/01/11 10/01/2011   Class 3 severe obesity due to excess calories with serious comorbidity and body mass index (BMI) of 40.0 to 44.9 in adult ([place]) 09/03/2011    is allergic to pollen extract, compazine  [prochlorperazine], latex, codeine, diflucan [fluconazole], shellfish allergy, and betadine [povidone iodine].  MEDICAL HISTORY: Past Medical History:  Diagnosis Date   Anxiety    Asthma    Back pain    Breast cancer ([place])    Constipation    DUB (dysfunctional uterine bleeding) 2007   Edema of both lower extremities    Elevated cholesterol    Family history of breast cancer 05/12/2020   Family history of lung cancer 05/12/2020   Food allergy    H/O menorrhagia 05/01/2006   High cholesterol    History of ovarian cyst 2007   Hypertension 03/31/2005   Increased BMI 07/11/2006   Joint pain    Migraine    N&V (nausea and vomiting)    Obesity 2007   Obstructive sleep apnea syndrome, moderate 10/27/2013   no cpap   Sleep apnea    SOB (shortness of breath)    Vitamin D deficiency    Vitamin D deficiency    Weight loss 07/11/2006    SURGICAL HISTORY: Past Surgical History:  Procedure Laterality Date   BREAST LUMPECTOMY WITH RADIOACTIVE SEED AND SENTINEL LYMPH NODE BIOPSY Right 06/02/2020   Procedure: RIGHT BREAST LUMPECTOMY WITH RADIOACTIVE SEED AND RIGHT SENTINEL LYMPH NODE BIOPSY;  Surgeon: [name], MD;  Location: [facility];  Service: General;  Laterality: Right;   BREAST REDUCTION SURGERY  05/1991   CESAREAN SECTION     HYSTEROSCOPY  05/01/2006   PORTACATH PLACEMENT Right 06/02/2020   Procedure:  INSERTION PORT-A-CATH;  Surgeon: [name], MD;  Location: [facility];  Service: General;  Laterality: Right;    SOCIAL HISTORY: Social History   Socioeconomic History   Marital status: Single    Spouse name: Not on file   Number of children: Not on file   Years of education: Not on file   Highest education level: Not on file  Occupational History   Occupation: Education officer, museum  Tobacco Use   Smoking status: Never   Smokeless tobacco: Never  Vaping Use   Vaping Use: Never used  Substance and Sexual Activity   Alcohol use: No    Drug use: Not Currently    Frequency: 2.0 times per week   Sexual activity: Not Currently    Birth control/protection: I.U.D.    Comment: Mirena  Other Topics Concern   Not on file  Social History Narrative   Not on file   Social Determinants of Health   Financial Resource Strain: High Risk (03/14/2022)   Overall Financial Resource Strain (CARDIA)    Difficulty of Paying Living Expenses: Hard  Food Insecurity: Not on file  Transportation Needs: Not on file  Physical Activity: Not on file  Stress: Not on file  Social Connections: Not on file  Intimate Partner Violence: Not on file    FAMILY HISTORY: Family History  Problem Relation Age of Onset   Hypertension Mother    High Cholesterol Mother    Thyroid disease Mother    Cancer Mother    Sleep apnea Mother    Hypertension Father    Heart attack Father    Sudden death Father    Diabetes Maternal Grandmother    Bone cancer Maternal Grandfather        dx after 24   Breast cancer Maternal Aunt        dx 55s   Lung cancer Maternal Aunt        dx after 50    Review of Systems  Constitutional:  Negative for appetite change, chills, fatigue, fever and unexpected weight change.  HENT:   Negative for hearing loss, lump/mass and trouble swallowing.   Eyes:  Negative for eye problems and icterus.  Respiratory:  Negative for chest tightness, cough and shortness of breath.   Cardiovascular:  Negative for chest pain, leg swelling and palpitations.  Gastrointestinal:  Negative for abdominal distention, abdominal pain, constipation, diarrhea, nausea and vomiting.  Endocrine: Negative for hot flashes.  Genitourinary:  Negative for difficulty urinating.   Musculoskeletal:  Negative for arthralgias.  Skin:  Negative for itching and rash.  Neurological:  Negative for dizziness, extremity weakness, headaches and numbness.  Hematological:  Negative for adenopathy. Does not bruise/bleed easily.  Psychiatric/Behavioral:  Negative for  depression. The patient is not nervous/anxious.     PHYSICAL EXAMINATION  ECOG PERFORMANCE STATUS: 0 - Asymptomatic  Vitals:   04/26/22 1129  BP: 128/71  Pulse: 64  Resp: 19  Temp: 97.8 F (36.6 C)  SpO2: 100%    Physical Exam Constitutional:      General: She is not in acute distress.    Appearance: Normal appearance. She is not toxic-appearing.  HENT:     Head: Normocephalic and atraumatic.  Eyes:     General: No scleral icterus. Cardiovascular:     Rate and Rhythm: Normal rate and regular rhythm.     Pulses: Normal pulses.     Heart sounds: Normal heart sounds.  Pulmonary:     Effort: Pulmonary effort is normal.  Breath sounds: Normal breath sounds.  Abdominal:     General: Abdomen is flat. Bowel sounds are normal. There is no distension.     Palpations: Abdomen is soft.     Tenderness: There is no abdominal tenderness.  Musculoskeletal:        General: No swelling.     Cervical back: Neck supple.  Lymphadenopathy:     Cervical: No cervical adenopathy.  Skin:    General: Skin is warm and dry.     Findings: No rash.  Neurological:     General: No focal deficit present.     Mental Status: She is alert.  Psychiatric:        Mood and Affect: Mood normal.        Behavior: Behavior normal.     LABORATORY DATA:  CBC    Component Value Date/Time   WBC 4.1 04/26/2022 1107   WBC 5.3 11/28/2021 0901   RBC 3.85 (L) 04/26/2022 1107   HGB 11.1 (L) 04/26/2022 1107   HGB 13.2 09/22/2019 1040   HCT 33.7 (L) 04/26/2022 1107   HCT 41.3 09/22/2019 1040   PLT 232 04/26/2022 1107   PLT 247 09/22/2019 1040   MCV 87.5 04/26/2022 1107   MCV 83 09/22/2019 1040   MCH 28.8 04/26/2022 1107   MCHC 32.9 04/26/2022 1107   RDW 17.0 (H) 04/26/2022 1107   RDW 14.7 09/22/2019 1040   LYMPHSABS 1.2 04/26/2022 1107   MONOABS 0.6 04/26/2022 1107   EOSABS 0.3 04/26/2022 1107   BASOSABS 0.0 04/26/2022 1107    CMP     Component Value Date/Time   NA 142 04/26/2022 1107    NA 142 09/22/2019 1040   K 3.3 (L) 04/26/2022 1107   CL 106 04/26/2022 1107   CO2 30 04/26/2022 1107   GLUCOSE 97 04/26/2022 1107   BUN 10 04/26/2022 1107   BUN 11 09/22/2019 1040   CREATININE 0.62 04/26/2022 1107   CALCIUM 8.8 (L) 04/26/2022 1107   PROT 6.6 04/26/2022 1107   PROT 6.7 06/17/2019 1054   ALBUMIN 3.4 (L) 04/26/2022 1107   ALBUMIN 4.5 06/17/2019 1054   AST 25 04/26/2022 1107   ALT 12 04/26/2022 1107   ALKPHOS 81 04/26/2022 1107   BILITOT 0.8 04/26/2022 1107   GFRNONAA >60 04/26/2022 1107   GFRAA 77 09/22/2019 1040      ASSESSMENT and THERAPY PLAN:   Malignant neoplasm of lower-outer quadrant of right breast of female, estrogen receptor positive ([place]) Pamela [name] is a 57 year old woman with metastatic breast cancer here today for labs and follow-up prior to receiving Enhertu therapy.  Has no clinical signs of breast cancer progression at today's appointment.  Her labs today are stable showing a mild hypokalemia with potassium of 3.3.  She typically receives potassium on the day of treatment and 2 days after an IV fluids that keeps her potassium level above 3.  She also has a supplement that she is taking and plans to restart it once she finishes her antibiotics.  She will proceed with her next dose of Enhertu.  We discussed healthy diet and exercise today and I gave her detailed information about sugar intake, fruits and vegetables.  We will see her back in 3 weeks for labs, follow-up, and her next treatment.   All questions were answered. The patient knows to call the clinic with any problems, questions or concerns. We can certainly see the patient much sooner if necessary.  Total encounter time:30 minutes*in face-to-face visit time, chart review,  lab review, care coordination, order entry, and documentation of the encounter time.  Wilber Bihari, NP 04/26/22 12:53 PM Medical Oncology and Hematology [facility] [place], [place]  [address] Tel. [phone]    Fax. [phone]  *Total Encounter Time as defined by the Centers for Medicare and Medicaid Services includes, in addition to the face-to-face time of a patient visit (documented in the note above) non-face-to-face time: obtaining and reviewing outside history, ordering and reviewing medications, tests or procedures, care coordination (communications with other health care professionals or caregivers) and documentation in the medical record.

## 2022-04-26 NOTE — Assessment & Plan Note (Addendum)
Pamela [name] is a 57 year old woman with metastatic breast cancer here today for labs and follow-up prior to receiving Enhertu therapy.  Has no clinical signs of breast cancer progression at today's appointment.  Her labs today are stable showing a mild hypokalemia with potassium of 3.3.  She typically receives potassium on the day of treatment and 2 days after an IV fluids that keeps her potassium level above 3.  She also has a supplement that she is taking and plans to restart it once she finishes her antibiotics.  She will proceed with her next dose of Enhertu.  We discussed healthy diet and exercise today and I gave her detailed information about sugar intake, fruits and vegetables.  We will see her back in 3 weeks for labs, follow-up, and her next treatment.

## 2022-04-26 NOTE — Patient Instructions (Signed)
[facility]  Discharge Instructions: Thank you for choosing [facility] to provide your oncology and hematology care.   If you have a lab appointment with the [facility], please go directly to the [facility] and check in at the registration area.   Wear comfortable clothing and clothing appropriate for easy access to any Portacath or PICC line.   We strive to give you quality time with your provider. You may need to reschedule your appointment if you arrive late (15 or more minutes).  Arriving late affects you and other patients whose appointments are after yours.  Also, if you miss three or more appointments without notifying the office, you may be dismissed from the clinic at the provider's discretion.      For prescription refill requests, have your pharmacy contact our office and allow 72 hours for refills to be completed.    Today you received the following chemotherapy and/or immunotherapy agents enhertu      To help prevent nausea and vomiting after your treatment, we encourage you to take your nausea medication as directed.  BELOW ARE SYMPTOMS THAT SHOULD BE REPORTED IMMEDIATELY: *FEVER GREATER THAN 100.4 F (38 C) OR HIGHER *CHILLS OR SWEATING *NAUSEA AND VOMITING THAT IS NOT CONTROLLED WITH YOUR NAUSEA MEDICATION *UNUSUAL SHORTNESS OF BREATH *UNUSUAL BRUISING OR BLEEDING *URINARY PROBLEMS (pain or burning when urinating, or frequent urination) *BOWEL PROBLEMS (unusual diarrhea, constipation, pain near the anus) TENDERNESS IN MOUTH AND THROAT WITH OR WITHOUT PRESENCE OF ULCERS (sore throat, sores in mouth, or a toothache) UNUSUAL RASH, SWELLING OR PAIN  UNUSUAL VAGINAL DISCHARGE OR ITCHING   Items with * indicate a potential emergency and should be followed up as soon as possible or go to the Emergency Department if any problems should occur.  Please show the CHEMOTHERAPY ALERT CARD or IMMUNOTHERAPY ALERT CARD at check-in  to the Emergency Department and triage nurse.  Should you have questions after your visit or need to cancel or reschedule your appointment, please contact [facility]  Dept: [phone]  and follow the prompts.  Office hours are 8:00 a.m. to 4:30 p.m. Monday - Friday. Please note that voicemails left after 4:00 p.m. may not be returned until the following business day.  We are closed weekends and major holidays. You have access to a nurse at all times for urgent questions. Please call the main number to the clinic Dept: [phone] and follow the prompts.   For any non-urgent questions, you may also contact your provider using MyChart. We now offer e-Visits for anyone 62 and older to request care online for non-urgent symptoms. For details visit [email or web address].   Also download the MyChart app! Go to the app store, search "MyChart", open the app, select [facility], and log in with your MyChart username and password.

## 2022-04-27 ENCOUNTER — Inpatient Hospital Stay: Payer: 59

## 2022-04-28 ENCOUNTER — Inpatient Hospital Stay: Payer: 59

## 2022-04-28 VITALS — BP 149/79 | HR 59 | Temp 97.7°F | Resp 17

## 2022-04-28 DIAGNOSIS — R110 Nausea: Secondary | ICD-10-CM

## 2022-04-28 DIAGNOSIS — Z5112 Encounter for antineoplastic immunotherapy: Secondary | ICD-10-CM | POA: Diagnosis not present

## 2022-04-28 DIAGNOSIS — C50919 Malignant neoplasm of unspecified site of unspecified female breast: Secondary | ICD-10-CM

## 2022-04-28 DIAGNOSIS — Z95828 Presence of other vascular implants and grafts: Secondary | ICD-10-CM

## 2022-04-28 DIAGNOSIS — Z170 Estrogen receptor positive status [ER+]: Secondary | ICD-10-CM

## 2022-04-28 MED ORDER — SODIUM CHLORIDE 0.9 % IV SOLN: INTRAVENOUS | Status: AC

## 2022-04-28 MED ORDER — POTASSIUM CHLORIDE 10 MEQ/100ML IV SOLN
10.0000 meq | INTRAVENOUS | Status: AC
  Administered 2022-04-28 (×2): 10 meq via INTRAVENOUS
  Filled 2022-04-28 (×2): qty 100

## 2022-04-28 MED ORDER — HEPARIN SOD (PORK) LOCK FLUSH 100 UNIT/ML IV SOLN
500.0000 [IU] | Freq: Once | INTRAVENOUS | Status: AC
  Administered 2022-04-28: 500 [IU]

## 2022-04-28 MED ORDER — SODIUM CHLORIDE 0.9% FLUSH
10.0000 mL | Freq: Once | INTRAVENOUS | Status: AC
  Administered 2022-04-28: 10 mL

## 2022-05-01 ENCOUNTER — Encounter: Payer: Self-pay | Admitting: Pharmacist

## 2022-05-08 ENCOUNTER — Ambulatory Visit: Payer: 59 | Admitting: Internal Medicine

## 2022-05-08 ENCOUNTER — Encounter: Payer: Self-pay | Admitting: Internal Medicine

## 2022-05-08 ENCOUNTER — Telehealth: Payer: Self-pay

## 2022-05-08 VITALS — BP 126/78 | HR 78 | Temp 98.3°F | Ht 69.0 in | Wt 300.0 lb

## 2022-05-08 DIAGNOSIS — I427 Cardiomyopathy due to drug and external agent: Secondary | ICD-10-CM | POA: Diagnosis not present

## 2022-05-08 DIAGNOSIS — T451X5A Adverse effect of antineoplastic and immunosuppressive drugs, initial encounter: Secondary | ICD-10-CM | POA: Insufficient documentation

## 2022-05-08 DIAGNOSIS — I10 Essential (primary) hypertension: Secondary | ICD-10-CM | POA: Diagnosis not present

## 2022-05-08 DIAGNOSIS — E7800 Pure hypercholesterolemia, unspecified: Secondary | ICD-10-CM

## 2022-05-08 DIAGNOSIS — Z6841 Body Mass Index (BMI) 40.0 and over, adult: Secondary | ICD-10-CM

## 2022-05-08 NOTE — Telephone Encounter (Signed)
Patients Zepbound initially denied. We will start E-appeal.

## 2022-05-08 NOTE — Progress Notes (Signed)
I,Pamela [name],acting as a scribe for Pamela Greenland, MD.,have documented all relevant documentation on the behalf of Pamela Greenland, MD,as directed by  Pamela Greenland, MD while in the presence of Pamela Greenland, MD.    Subjective:     Patient ID: Pamela [name] , female    DOB: 12-29-1965 , 57 y.o.   MRN: [id number]   Chief Complaint  Patient presents with   Hypertension   Hyperlipidemia    HPI  The patient is here today for a blood pressure f/u.  She reports compliance with meds. She denies headache, chest pain, dizziness, SOB. She has upcoming appointment with GYN, Dr. Mancel Bale.  Hypertension This is a chronic problem. The current episode started more than 1 year ago. The problem has been gradually improving since onset. The problem is controlled. There are no associated agents to hypertension. Risk factors for coronary artery disease include obesity and sedentary lifestyle. Past treatments include diuretics and angiotensin blockers. There are no compliance problems.  There is no history of angina. There is no history of chronic renal disease.  Hyperlipidemia This is a chronic problem. The current episode started more than 1 year ago. She has no history of chronic renal disease. Current antihyperlipidemic treatment includes statins. The current treatment provides moderate improvement of lipids. Risk factors for coronary artery disease include dyslipidemia and obesity.     Past Medical History:  Diagnosis Date   Anxiety    Asthma    Back pain    Breast cancer ([place])    Constipation    DUB (dysfunctional uterine bleeding) 2007   Edema of both lower extremities    Elevated cholesterol    Family history of breast cancer 05/12/2020   Family history of lung cancer 05/12/2020   Food allergy    H/O menorrhagia 05/01/2006   High cholesterol    History of ovarian cyst 2007   Hypertension 03/31/2005   Increased BMI 07/11/2006   Joint pain    Migraine    N&V (nausea  and vomiting)    Obesity 2007   Obstructive sleep apnea syndrome, moderate 10/27/2013   no cpap   Sleep apnea    SOB (shortness of breath)    Vitamin D deficiency    Vitamin D deficiency    Weight loss 07/11/2006     Family History  Problem Relation Age of Onset   Hypertension Mother    High Cholesterol Mother    Thyroid disease Mother    Cancer Mother    Sleep apnea Mother    Hypertension Father    Heart attack Father    Sudden death Father    Diabetes Maternal Grandmother    Bone cancer Maternal Grandfather        dx after 75   Breast cancer Maternal Aunt        dx 30s   Lung cancer Maternal Aunt        dx after 50     Current Outpatient Medications:    amLODipine (NORVASC) 10 MG tablet, Take 1 tablet (10 mg total) by mouth daily., Disp: 90 tablet, Rfl: 2   aspirin EC 81 MG tablet, Take 1 tablet (81 mg total) by mouth daily. Swallow whole., Disp: 30 tablet, Rfl: 11   cetirizine (ZYRTEC ALLERGY) 10 MG tablet, Take 1 tablet (10 mg total) by mouth daily., Disp: 30 tablet, Rfl: 2   EDARBYCLOR 40-25 MG TABS, Take 1 tablet by mouth daily., Disp: 90 tablet, Rfl: 2  HYDROcodone bit-homatropine (HYDROMET) 5-1.5 MG/5ML syrup, Take 5 mLs by mouth every 6 (six) hours as needed., Disp: 120 mL, Rfl: 0   levonorgestrel (MIRENA, 52 MG,) 20 MCG/DAY IUD, 1 each by Intrauterine route once., Disp: , Rfl:    lidocaine-prilocaine (EMLA) cream, Apply 1 application topically daily as needed., Disp: 60 g, Rfl: 0   LORazepam (ATIVAN) 0.5 MG tablet, Take 1 tablet (0.5 mg total) by mouth every 8 (eight) hours as needed for anxiety., Disp: 30 tablet, Rfl: 0   prochlorperazine (COMPAZINE) 10 MG tablet, Take 1 tablet (10 mg total) by mouth every 6 (six) hours as needed (Nausea or vomiting)., Disp: 30 tablet, Rfl: 1   rosuvastatin (CRESTOR) 40 MG tablet, Take 1 tablet (40 mg total) by mouth daily., Disp: 90 tablet, Rfl: 3   tirzepatide (ZEPBOUND) 5 MG/0.5ML Pen, Inject 5 mg into the skin once a week.  (Patient not taking: Reported on 05/17/2022), Disp: 2 mL, Rfl: 0   potassium chloride SA (KLOR-CON M) 20 MEQ tablet, Take 1 tablet (20 mEq total) by mouth daily., Disp: 30 tablet, Rfl: 0 No current facility-administered medications for this visit.  Facility-Administered Medications Ordered in Other Visits:    heparin lock flush 100 unit/mL, 500 Units, Intracatheter, Once, Gudena, Vinay, MD   sodium chloride flush (NS) 0.9 % injection 10 mL, 10 mL, Intracatheter, Once, Nicholas Lose, MD   Allergies  Allergen Reactions   Pollen Extract Shortness Of Breath   Compazine [Prochlorperazine] Anxiety    IV compazine causes severe anxiety attack   Latex Rash   Codeine Hives   Diflucan [Fluconazole] Hives   Shellfish Allergy Hives   Betadine [Povidone Iodine] Rash     Review of Systems  Constitutional: Negative.   Eyes: Negative.   Respiratory: Negative.  Negative for apnea.   Cardiovascular: Negative.   Gastrointestinal: Negative.   Genitourinary: Negative.   Musculoskeletal: Negative.   Skin: Negative.   Allergic/Immunologic: Negative.   Neurological: Negative.   Hematological: Negative.   Psychiatric/Behavioral: Negative.       Today's Vitals   05/08/22 1021  BP: 126/78  Pulse: 78  Temp: 98.3 F (36.8 C)  SpO2: 98%  Weight: 300 lb (136.1 kg)  Height: 5\' 9"  (1.753 m)   Body mass index is 44.3 kg/m.  Wt Readings from Last 3 Encounters:  05/17/22 (!) 305 lb 4.8 oz (138.5 kg)  05/08/22 300 lb (136.1 kg)  04/26/22 297 lb 9.6 oz (135 kg)    Objective:  Physical Exam Vitals and nursing note reviewed.  Constitutional:      Appearance: Normal appearance. She is obese.  HENT:     Head: Normocephalic and atraumatic.     Nose:     Comments: Masked     Mouth/Throat:     Comments: Masked  Eyes:     Extraocular Movements: Extraocular movements intact.  Cardiovascular:     Rate and Rhythm: Normal rate and regular rhythm.     Heart sounds: Normal heart sounds.  Pulmonary:      Effort: Pulmonary effort is normal.     Breath sounds: Normal breath sounds.  Musculoskeletal:     Cervical back: Normal range of motion.  Skin:    General: Skin is warm.  Neurological:     General: No focal deficit present.     Mental Status: She is alert.  Psychiatric:        Mood and Affect: Mood normal.        Behavior: Behavior normal.  Assessment And Plan:     1. Essential hypertension Comments: Chronic, controlled. Optimal BP control < 120/80.  She will c/w amlodipine 10mg  daily and edarbyclor 40/25mg  daily. Encouraged to follow low soidum diet. - Lipid panel; Future  2. Pure hypercholesterolemia Comments: Chronic, currently on statin therapy.  She agrees to have labwork performed at [facility]. - TSH; Future  3. Chemotherapy induced cardiomyopathy ([place]) Comments: Last echo performed, 1/24 - EF 55-60%. Cardiology input is appreciated.  4. Class 3 severe obesity due to excess calories with serious comorbidity and body mass index (BMI) of 40.0 to 44.9 in adult [facility]) She is encouraged to initially strive for BMI less than 35 to decrease cardiac risk. Advised to aim for at least 150 minutes of exercise per week.    Patient was given opportunity to ask questions. Patient verbalized understanding of the plan and was able to repeat key elements of the plan. All questions were answered to their satisfaction.   I, Pamela Greenland, MD, have reviewed all documentation for this visit. The documentation on 05/08/22 for the exam, diagnosis, procedures, and orders are all accurate and complete.   IF YOU HAVE BEEN REFERRED TO A SPECIALIST, IT MAY TAKE 1-2 WEEKS TO SCHEDULE/PROCESS THE REFERRAL. IF YOU HAVE NOT HEARD FROM US/SPECIALIST IN TWO WEEKS, PLEASE GIVE [place] A CALL AT [phone].   THE PATIENT IS ENCOURAGED TO PRACTICE SOCIAL DISTANCING DUE TO THE COVID-19 PANDEMIC.

## 2022-05-08 NOTE — Patient Instructions (Signed)
Hypertension, Adult Hypertension is another name for high blood pressure. High blood pressure forces your heart to work harder to pump blood. This can cause problems over time. There are two numbers in a blood pressure reading. There is a top number (systolic) over a bottom number (diastolic). It is best to have a blood pressure that is below 120/80. What are the causes? The cause of this condition is not known. Some other conditions can lead to high blood pressure. What increases the risk? Some lifestyle factors can make you more likely to develop high blood pressure: Smoking. Not getting enough exercise or physical activity. Being overweight. Having too much fat, sugar, calories, or salt (sodium) in your diet. Drinking too much alcohol. Other risk factors include: Having any of these conditions: Heart disease. Diabetes. High cholesterol. Kidney disease. Obstructive sleep apnea. Having a family history of high blood pressure and high cholesterol. Age. The risk increases with age. Stress. What are the signs or symptoms? High blood pressure may not cause symptoms. Very high blood pressure (hypertensive crisis) may cause: Headache. Fast or uneven heartbeats (palpitations). Shortness of breath. Nosebleed. Vomiting or feeling like you may vomit (nauseous). Changes in how you see. Very bad chest pain. Feeling dizzy. Seizures. How is this treated? This condition is treated by making healthy lifestyle changes, such as: Eating healthy foods. Exercising more. Drinking less alcohol. Your doctor may prescribe medicine if lifestyle changes do not help enough and if: Your top number is above 130. Your bottom number is above 80. Your personal target blood pressure may vary. Follow these instructions at home: Eating and drinking  If told, follow the DASH eating plan. To follow this plan: Fill one half of your plate at each meal with fruits and vegetables. Fill one fourth of your plate  at each meal with whole grains. Whole grains include whole-wheat pasta, brown rice, and whole-grain bread. Eat or drink low-fat dairy products, such as skim milk or low-fat yogurt. Fill one fourth of your plate at each meal with low-fat (lean) proteins. Low-fat proteins include fish, chicken without skin, eggs, beans, and tofu. Avoid fatty meat, cured and processed meat, or chicken with skin. Avoid pre-made or processed food. Limit the amount of salt in your diet to less than 1,500 mg each day. Do not drink alcohol if: Your doctor tells you not to drink. You are pregnant, may be pregnant, or are planning to become pregnant. If you drink alcohol: Limit how much you have to: 0-1 drink a day for women. 0-2 drinks a day for men. Know how much alcohol is in your drink. In the [place] one drink equals one 12 oz bottle of beer (355 mL), one 5 oz glass of wine (148 mL), or one 1 oz glass of hard liquor (44 mL). Lifestyle  Work with your doctor to stay at a healthy weight or to lose weight. Ask your doctor what the best weight is for you. Get at least 30 minutes of exercise that causes your heart to beat faster (aerobic exercise) most days of the week. This may include walking, swimming, or biking. Get at least 30 minutes of exercise that strengthens your muscles (resistance exercise) at least 3 days a week. This may include lifting weights or doing Pilates. Do not smoke or use any products that contain nicotine or tobacco. If you need help quitting, ask your doctor. Check your blood pressure at home as told by your doctor. Keep all follow-up visits. Medicines Take over-the-counter and prescription medicines   only as told by your doctor. Follow directions carefully. Do not skip doses of blood pressure medicine. The medicine does not work as well if you skip doses. Skipping doses also puts you at risk for problems. Ask your doctor about side effects or reactions to medicines that you should watch  for. Contact a doctor if: You think you are having a reaction to the medicine you are taking. You have headaches that keep coming back. You feel dizzy. You have swelling in your ankles. You have trouble with your vision. Get help right away if: You get a very bad headache. You start to feel mixed up (confused). You feel weak or numb. You feel faint. You have very bad pain in your: Chest. Belly (abdomen). You vomit more than once. You have trouble breathing. These symptoms may be an emergency. Get help right away. Call 911. Do not wait to see if the symptoms will go away. Do not drive yourself to the hospital. Summary Hypertension is another name for high blood pressure. High blood pressure forces your heart to work harder to pump blood. For most people, a normal blood pressure is less than 120/80. Making healthy choices can help lower blood pressure. If your blood pressure does not get lower with healthy choices, you may need to take medicine. This information is not intended to replace advice given to you by your health care provider. Make sure you discuss any questions you have with your health care provider. Document Revised: 12/08/2020 Document Reviewed: 12/08/2020 Elsevier Patient Education  2023 Elsevier Inc.  

## 2022-05-11 ENCOUNTER — Other Ambulatory Visit: Payer: 59

## 2022-05-11 ENCOUNTER — Ambulatory Visit: Payer: 59 | Admitting: Hematology and Oncology

## 2022-05-11 ENCOUNTER — Ambulatory Visit: Payer: 59

## 2022-05-12 ENCOUNTER — Ambulatory Visit: Payer: 59

## 2022-05-16 MED FILL — Fosaprepitant Dimeglumine For IV Infusion 150 MG (Base Eq): INTRAVENOUS | Qty: 5 | Status: AC

## 2022-05-17 ENCOUNTER — Other Ambulatory Visit: Payer: Self-pay

## 2022-05-17 ENCOUNTER — Inpatient Hospital Stay: Payer: 59 | Admitting: Licensed Clinical Social Worker

## 2022-05-17 ENCOUNTER — Inpatient Hospital Stay: Payer: 59 | Attending: Hematology and Oncology

## 2022-05-17 ENCOUNTER — Encounter: Payer: Self-pay | Admitting: Adult Health

## 2022-05-17 ENCOUNTER — Inpatient Hospital Stay (HOSPITAL_BASED_OUTPATIENT_CLINIC_OR_DEPARTMENT_OTHER): Payer: 59 | Admitting: Adult Health

## 2022-05-17 ENCOUNTER — Inpatient Hospital Stay: Payer: 59

## 2022-05-17 ENCOUNTER — Other Ambulatory Visit: Payer: Self-pay | Admitting: Internal Medicine

## 2022-05-17 VITALS — BP 130/73 | HR 68 | Temp 97.5°F | Resp 20 | Wt 305.3 lb

## 2022-05-17 DIAGNOSIS — Z79899 Other long term (current) drug therapy: Secondary | ICD-10-CM | POA: Insufficient documentation

## 2022-05-17 DIAGNOSIS — Z808 Family history of malignant neoplasm of other organs or systems: Secondary | ICD-10-CM | POA: Insufficient documentation

## 2022-05-17 DIAGNOSIS — R112 Nausea with vomiting, unspecified: Secondary | ICD-10-CM | POA: Diagnosis not present

## 2022-05-17 DIAGNOSIS — Z170 Estrogen receptor positive status [ER+]: Secondary | ICD-10-CM | POA: Diagnosis not present

## 2022-05-17 DIAGNOSIS — Z5986 Financial insecurity: Secondary | ICD-10-CM | POA: Diagnosis not present

## 2022-05-17 DIAGNOSIS — Z8349 Family history of other endocrine, nutritional and metabolic diseases: Secondary | ICD-10-CM | POA: Insufficient documentation

## 2022-05-17 DIAGNOSIS — R09A2 Foreign body sensation, throat: Secondary | ICD-10-CM | POA: Insufficient documentation

## 2022-05-17 DIAGNOSIS — Z5112 Encounter for antineoplastic immunotherapy: Secondary | ICD-10-CM | POA: Diagnosis present

## 2022-05-17 DIAGNOSIS — C50919 Malignant neoplasm of unspecified site of unspecified female breast: Secondary | ICD-10-CM

## 2022-05-17 DIAGNOSIS — Z95828 Presence of other vascular implants and grafts: Secondary | ICD-10-CM

## 2022-05-17 DIAGNOSIS — I700 Atherosclerosis of aorta: Secondary | ICD-10-CM | POA: Diagnosis not present

## 2022-05-17 DIAGNOSIS — C50511 Malignant neoplasm of lower-outer quadrant of right female breast: Secondary | ICD-10-CM

## 2022-05-17 DIAGNOSIS — E876 Hypokalemia: Secondary | ICD-10-CM | POA: Insufficient documentation

## 2022-05-17 DIAGNOSIS — Z888 Allergy status to other drugs, medicaments and biological substances status: Secondary | ICD-10-CM | POA: Insufficient documentation

## 2022-05-17 DIAGNOSIS — E7800 Pure hypercholesterolemia, unspecified: Secondary | ICD-10-CM

## 2022-05-17 DIAGNOSIS — Z803 Family history of malignant neoplasm of breast: Secondary | ICD-10-CM | POA: Diagnosis not present

## 2022-05-17 DIAGNOSIS — R110 Nausea: Secondary | ICD-10-CM

## 2022-05-17 DIAGNOSIS — R42 Dizziness and giddiness: Secondary | ICD-10-CM | POA: Insufficient documentation

## 2022-05-17 DIAGNOSIS — Z801 Family history of malignant neoplasm of trachea, bronchus and lung: Secondary | ICD-10-CM | POA: Insufficient documentation

## 2022-05-17 DIAGNOSIS — Z8249 Family history of ischemic heart disease and other diseases of the circulatory system: Secondary | ICD-10-CM | POA: Diagnosis not present

## 2022-05-17 DIAGNOSIS — Z833 Family history of diabetes mellitus: Secondary | ICD-10-CM | POA: Diagnosis not present

## 2022-05-17 DIAGNOSIS — I10 Essential (primary) hypertension: Secondary | ICD-10-CM | POA: Insufficient documentation

## 2022-05-17 DIAGNOSIS — Z825 Family history of asthma and other chronic lower respiratory diseases: Secondary | ICD-10-CM | POA: Diagnosis not present

## 2022-05-17 DIAGNOSIS — N6315 Unspecified lump in the right breast, overlapping quadrants: Secondary | ICD-10-CM | POA: Diagnosis not present

## 2022-05-17 DIAGNOSIS — R051 Acute cough: Secondary | ICD-10-CM | POA: Diagnosis not present

## 2022-05-17 DIAGNOSIS — Z809 Family history of malignant neoplasm, unspecified: Secondary | ICD-10-CM | POA: Diagnosis not present

## 2022-05-17 DIAGNOSIS — E782 Mixed hyperlipidemia: Secondary | ICD-10-CM | POA: Diagnosis not present

## 2022-05-17 LAB — CBC WITH DIFFERENTIAL (CANCER CENTER ONLY)
Abs Immature Granulocytes: 0.01 10*3/uL (ref 0.00–0.07)
Basophils Absolute: 0 10*3/uL (ref 0.0–0.1)
Basophils Relative: 1 %
Eosinophils Absolute: 0.2 10*3/uL (ref 0.0–0.5)
Eosinophils Relative: 4 %
HCT: 33.4 % — ABNORMAL LOW (ref 36.0–46.0)
Hemoglobin: 10.8 g/dL — ABNORMAL LOW (ref 12.0–15.0)
Immature Granulocytes: 0 %
Lymphocytes Relative: 26 %
Lymphs Abs: 0.9 10*3/uL (ref 0.7–4.0)
MCH: 28.9 pg (ref 26.0–34.0)
MCHC: 32.3 g/dL (ref 30.0–36.0)
MCV: 89.3 fL (ref 80.0–100.0)
Monocytes Absolute: 0.6 10*3/uL (ref 0.1–1.0)
Monocytes Relative: 18 %
Neutro Abs: 1.8 10*3/uL (ref 1.7–7.7)
Neutrophils Relative %: 51 %
Platelet Count: 208 10*3/uL (ref 150–400)
RBC: 3.74 MIL/uL — ABNORMAL LOW (ref 3.87–5.11)
RDW: 16.8 % — ABNORMAL HIGH (ref 11.5–15.5)
WBC Count: 3.6 10*3/uL — ABNORMAL LOW (ref 4.0–10.5)
nRBC: 0 % (ref 0.0–0.2)

## 2022-05-17 LAB — CMP (CANCER CENTER ONLY)
ALT: 12 U/L (ref 0–44)
AST: 23 U/L (ref 15–41)
Albumin: 3.4 g/dL — ABNORMAL LOW (ref 3.5–5.0)
Alkaline Phosphatase: 87 U/L (ref 38–126)
Anion gap: 6 (ref 5–15)
BUN: 8 mg/dL (ref 6–20)
CO2: 30 mmol/L (ref 22–32)
Calcium: 9 mg/dL (ref 8.9–10.3)
Chloride: 106 mmol/L (ref 98–111)
Creatinine: 0.69 mg/dL (ref 0.44–1.00)
GFR, Estimated: >60 mL/min (ref >60)
Glucose, Bld: 95 mg/dL (ref 70–99)
Potassium: 3.3 mmol/L — ABNORMAL LOW (ref 3.5–5.1)
Sodium: 142 mmol/L (ref 135–145)
Total Bilirubin: 1.5 mg/dL — ABNORMAL HIGH (ref 0.3–1.2)
Total Protein: 6.5 g/dL (ref 6.5–8.1)

## 2022-05-17 MED ORDER — SODIUM CHLORIDE 0.9 % IV SOLN
150.0000 mg | Freq: Once | INTRAVENOUS | Status: AC
  Administered 2022-05-17: 150 mg via INTRAVENOUS
  Filled 2022-05-17: qty 150

## 2022-05-17 MED ORDER — POTASSIUM CHLORIDE 10 MEQ/100ML IV SOLN: 10.0000 meq | INTRAVENOUS | Status: DC

## 2022-05-17 MED ORDER — PALONOSETRON HCL INJECTION 0.25 MG/5ML
0.2500 mg | Freq: Once | INTRAVENOUS | Status: AC
  Administered 2022-05-17: 0.25 mg via INTRAVENOUS
  Filled 2022-05-17: qty 5

## 2022-05-17 MED ORDER — SODIUM CHLORIDE 0.9% FLUSH
10.0000 mL | Freq: Once | INTRAVENOUS | Status: AC
  Administered 2022-05-17: 10 mL

## 2022-05-17 MED ORDER — POTASSIUM CHLORIDE 10 MEQ/100ML IV SOLN
10.0000 meq | Freq: Once | INTRAVENOUS | Status: AC
  Administered 2022-05-17: 10 meq via INTRAVENOUS
  Filled 2022-05-17: qty 100

## 2022-05-17 MED ORDER — POTASSIUM CHLORIDE CRYS ER 20 MEQ PO TBCR: 20.0000 meq | EXTENDED_RELEASE_TABLET | Freq: Every day | ORAL | 0 refills | Status: DC

## 2022-05-17 MED ORDER — FAM-TRASTUZUMAB DERUXTECAN-NXKI CHEMO 100 MG IV SOLR
3.2000 mg/kg | Freq: Once | INTRAVENOUS | Status: AC
  Administered 2022-05-17: 400 mg via INTRAVENOUS
  Filled 2022-05-17: qty 20

## 2022-05-17 MED ORDER — HEPARIN SOD (PORK) LOCK FLUSH 100 UNIT/ML IV SOLN
500.0000 [IU] | Freq: Once | INTRAVENOUS | Status: AC | PRN
  Administered 2022-05-17: 500 [IU]

## 2022-05-17 MED ORDER — SODIUM CHLORIDE 0.9 % IV SOLN: Freq: Once | INTRAVENOUS | Status: DC

## 2022-05-17 MED ORDER — SODIUM CHLORIDE 0.9% FLUSH
10.0000 mL | INTRAVENOUS | Status: DC | PRN
  Administered 2022-05-17: 10 mL

## 2022-05-17 MED ORDER — DEXTROSE 5 % IV SOLN: Freq: Once | INTRAVENOUS | Status: AC

## 2022-05-17 MED ORDER — ACETAMINOPHEN 325 MG PO TABS
650.0000 mg | ORAL_TABLET | Freq: Once | ORAL | Status: AC
  Administered 2022-05-17: 650 mg via ORAL
  Filled 2022-05-17: qty 2

## 2022-05-17 MED ORDER — HEPARIN SOD (PORK) LOCK FLUSH 100 UNIT/ML IV SOLN: 500.0000 [IU] | Freq: Once | INTRAVENOUS | Status: DC

## 2022-05-17 MED ORDER — SODIUM CHLORIDE 0.9 % IV SOLN: Freq: Once | INTRAVENOUS | Status: AC

## 2022-05-17 NOTE — Patient Instructions (Signed)

## 2022-05-17 NOTE — Progress Notes (Signed)
[facility] up:    [name], [address] [place] [id number]   DIAGNOSIS: Cancer Staging  Malignant neoplasm of lower-outer quadrant of right breast of female, estrogen receptor positive ([place]) Staging form: Breast, AJCC 8th Edition - Clinical stage from 05/11/2020: Stage IA (cT1b, cN0, cM0, G3, ER+, PR-, HER2+) - Signed by Nicholas Lose, MD on 05/11/2020 Stage prefix: Initial diagnosis - Pathologic stage from 06/02/2020: Stage IA (pT1c, pN0, cM0, G3, ER+, PR-, HER2+) - Signed by Gardenia Phlegm, NP on 06/15/2020 Stage prefix: Initial diagnosis Histologic grading system: 3 grade system   SUMMARY OF ONCOLOGIC HISTORY: Oncology History  Malignant neoplasm of lower-outer quadrant of right breast of female, estrogen receptor positive ([place])  05/03/2020 Initial Diagnosis   Screening mammogram showed a 1.0cm mass at the 6 o'clock position in the right breast. Diagnostic mammogram and US showed the 1.2cm mass at the 6 o'clock position in the right breast. Biopsy showed IDC, grade 3, HER-2 positive (3+), ER 15% weak, PR-, Ki67 80%.   05/11/2020 Cancer Staging   Staging form: Breast, AJCC 8th Edition - Clinical stage from 05/11/2020: Stage IA (cT1b, cN0, cM0, G3, ER+, PR-, HER2+) - Signed by Nicholas Lose, MD on 05/11/2020 Stage prefix: Initial diagnosis   05/19/2020 Genetic Testing   Negative hereditary cancer genetic testing: no pathogenic variants detected in [facility] Breast STAT Panel and Common Hereditary Cancers Panel.  The report dates are May 19, 2020 (STAT) and May 23, 2020 (Common Hereditary).   The Common Hereditary Cancers Panel offered by [facility] includes sequencing and/or deletion duplication testing of the following 47 genes: APC, ATM, AXIN2, BARD1, BMPR1A, BRCA1, BRCA2, BRIP1, CDH1, CDK4, CDKN2A (p14ARF), CDKN2A (p16INK4a), CHEK2, CTNNA1, DICER1, EPCAM (Deletion/duplication testing only), GREM1 (promoter region  deletion/duplication testing only), GREM1, HOXB13, KIT, MEN1, MLH1, MSH2, MSH3, MSH6, MUTYH, NBN, NF1, NHTL1, PALB2, PDGFRA, PMS2, POLD1, POLE, PTEN, RAD50, RAD51C, RAD51D, SDHA, SDHB, SDHC, SDHD, SMAD4, SMARCA4. STK11, TP53, TSC1, TSC2, and VHL.  The following genes were evaluated for sequence changes only: SDHA and HOXB13 c.251G>A variant only.es only: SDHA and HOXB13 c.251G>A variant only.   06/02/2020 Surgery   Right lumpectomy ([place]): invasive and in situ ductal carcinoma, 1.3cm, clear margins, 1 right axillary lymph node negative for carcinoma.   06/02/2020 Cancer Staging   Staging form: Breast, AJCC 8th Edition - Pathologic stage from 06/02/2020: Stage IA (pT1c, pN0, cM0, G3, ER+, PR-, HER2+) - Signed by Gardenia Phlegm, NP on 06/15/2020 Stage prefix: Initial diagnosis Histologic grading system: 3 grade system   07/08/2020 - 10/21/2020 Chemotherapy   Taxol Herceptin followed by Herceptin maintenance   11/11/2020 -  Chemotherapy   Herceptin maintenance       04/14/2021 -  Anti-estrogen oral therapy   Tamoxifen 10 mg   08/21/2021 - 10/26/2021 Chemotherapy   Patient is on Treatment Plan : BREAST METASTATIC fam-trastuzumab deruxtecan-nxki (Enhertu) q21d     11/15/2021 -  Chemotherapy   Patient is on Treatment Plan : BREAST METASTATIC Fam-Trastuzumab Deruxtecan-nxki (Enhertu) (5.4) q21d       CURRENT THERAPY: Enhertu  INTERVAL HISTORY: Pamela [name] 57 y.o. female returns for follow-up while on treatment with Enhertu.  She is doing moderately well today.  She has no significant issues.  She needs potassium refilled, taking once per day and notes that the fluids she received with the potassium is quite helpful.  Her most recent echo occurred on 03/28/2022 demonstrating an EF of 55-60%   Patient Active  Problem List   Diagnosis Date Noted  . Chemotherapy induced cardiomyopathy ([place]) 05/08/2022  . Aortic atherosclerosis ([place]) 04/26/2022  . Acute cough 01/15/2022  .  Hypomagnesemia 11/26/2021  . Hypokalemia 11/26/2021  . Dizziness, fatigue, globus sensation 11/26/2021  . Breast cancer ([place]) 08/25/2021  . Nausea without vomiting 08/25/2021  . Prediabetes 03/13/2021  . Non-intractable vomiting 07/13/2020  . Port-A-Cath in place 07/08/2020  . Genetic testing 05/20/2020  . Family history of breast cancer 05/12/2020  . Family history of lung cancer 05/12/2020  . Malignant neoplasm of lower-outer quadrant of right breast of female, estrogen receptor positive ([place]) 05/03/2020  . Elevated troponin level not due myocardial infarction 09/18/2019  . Mixed hyperlipidemia 09/18/2019  . Hypertensive emergency 09/17/2019  . Primary osteoarthritis of right hip 08/06/2018  . Seasonal allergies 06/12/2018  . Fibrocystic disease of breast 05/27/2018  . Obstructive sleep apnea syndrome, moderate 10/27/2013  . Shift work sleep disorder 10/27/2013  . Psychic factors associated with diseases classified elsewhere 08/07/2013  . High cholesterol 07/21/2013  . Essential hypertension 07/21/2013  . Snoring 07/21/2013  . IUD contraception-inserted 10/01/11 10/01/2011  . Class 3 severe obesity due to excess calories with serious comorbidity and body mass index (BMI) of 40.0 to 44.9 in adult [facility]) 09/03/2011    is allergic to pollen extract, compazine [prochlorperazine], latex, codeine, diflucan [fluconazole], shellfish allergy, and betadine [povidone iodine].  MEDICAL HISTORY: Past Medical History:  Diagnosis Date  . Anxiety   . Asthma   . Back pain   . Breast cancer ([place])   . Constipation   . DUB (dysfunctional uterine bleeding) 2007  . Edema of both lower extremities   . Elevated cholesterol   . Family history of breast cancer 05/12/2020  . Family history of lung cancer 05/12/2020  . Food allergy   . H/O menorrhagia 05/01/2006  . High cholesterol   . History of ovarian cyst 2007  . Hypertension 03/31/2005  . Increased BMI 07/11/2006  . Joint pain   . Migraine    . N&V (nausea and vomiting)   . Obesity 2007  . Obstructive sleep apnea syndrome, moderate 10/27/2013   no cpap  . Sleep apnea   . SOB (shortness of breath)   . Vitamin D deficiency   . Vitamin D deficiency   . Weight loss 07/11/2006    SURGICAL HISTORY: Past Surgical History:  Procedure Laterality Date  . BREAST LUMPECTOMY WITH RADIOACTIVE SEED AND SENTINEL LYMPH NODE BIOPSY Right 06/02/2020   Procedure: RIGHT BREAST LUMPECTOMY WITH RADIOACTIVE SEED AND RIGHT SENTINEL LYMPH NODE BIOPSY;  Surgeon: [name], MD;  Location: [facility];  Service: General;  Laterality: Right;  . BREAST REDUCTION SURGERY  05/1991  . CESAREAN SECTION    . HYSTEROSCOPY  05/01/2006  . PORTACATH PLACEMENT Right 06/02/2020   Procedure: INSERTION PORT-A-CATH;  Surgeon: [name], MD;  Location: [facility];  Service: General;  Laterality: Right;    SOCIAL HISTORY: Social History   Socioeconomic History  . Marital status: Single    Spouse name: Not on file  . Number of children: Not on file  . Years of education: Not on file  . Highest education level: Not on file  Occupational History  . Occupation: Education officer, museum  Tobacco Use  . Smoking status: Never  . Smokeless tobacco: Never  Vaping Use  . Vaping Use: Never used  Substance and Sexual Activity  . Alcohol use: No  . Drug use: Not Currently    Frequency:  2.0 times per week  . Sexual activity: Not Currently    Birth control/protection: I.U.D.    Comment: Mirena  Other Topics Concern  . Not on file  Social History Narrative  . Not on file   Social Determinants of Health   Financial Resource Strain: High Risk (03/14/2022)   Overall Financial Resource Strain (CARDIA)   . Difficulty of Paying Living Expenses: Hard  Food Insecurity: Not on file  Transportation Needs: Not on file  Physical Activity: Not on file  Stress: Not on file  Social Connections: Not on file  Intimate Partner Violence: Not on  file    FAMILY HISTORY: Family History  Problem Relation Age of Onset  . Hypertension Mother   . High Cholesterol Mother   . Thyroid disease Mother   . Cancer Mother   . Sleep apnea Mother   . Hypertension Father   . Heart attack Father   . Sudden death Father   . Diabetes Maternal Grandmother   . Bone cancer Maternal Grandfather        dx after 71  . Breast cancer Maternal Aunt        dx 21s  . Lung cancer Maternal Aunt        dx after 50    Review of Systems  Constitutional:  Positive for fatigue (mild). Negative for appetite change, chills, fever and unexpected weight change.  HENT:   Negative for hearing loss, lump/mass and trouble swallowing.   Eyes:  Negative for eye problems and icterus.  Respiratory:  Negative for chest tightness, cough and shortness of breath.   Cardiovascular:  Negative for chest pain, leg swelling and palpitations.  Gastrointestinal:  Negative for abdominal distention, abdominal pain, constipation, diarrhea, nausea and vomiting.  Endocrine: Negative for hot flashes.  Genitourinary:  Negative for difficulty urinating.   Musculoskeletal:  Negative for arthralgias.  Skin:  Negative for itching and rash.  Neurological:  Negative for dizziness, extremity weakness, headaches and numbness.  Hematological:  Negative for adenopathy. Does not bruise/bleed easily.  Psychiatric/Behavioral:  Negative for depression. The patient is not nervous/anxious.       PHYSICAL EXAMINATION  ECOG PERFORMANCE STATUS: 1 - Symptomatic but completely ambulatory  Vitals:   05/17/22 0939  BP: 130/73  Pulse: 68  Resp: 20  Temp: (!) 97.5 F (36.4 C)  SpO2: 99%    Physical Exam  LABORATORY DATA:  CBC    Component Value Date/Time   WBC 4.1 04/26/2022 1107   WBC 5.3 11/28/2021 0901   RBC 3.85 (L) 04/26/2022 1107   HGB 11.1 (L) 04/26/2022 1107   HGB 13.2 09/22/2019 1040   HCT 33.7 (L) 04/26/2022 1107   HCT 41.3 09/22/2019 1040   PLT 232 04/26/2022 1107   PLT  247 09/22/2019 1040   MCV 87.5 04/26/2022 1107   MCV 83 09/22/2019 1040   MCH 28.8 04/26/2022 1107   MCHC 32.9 04/26/2022 1107   RDW 17.0 (H) 04/26/2022 1107   RDW 14.7 09/22/2019 1040   LYMPHSABS 1.2 04/26/2022 1107   MONOABS 0.6 04/26/2022 1107   EOSABS 0.3 04/26/2022 1107   BASOSABS 0.0 04/26/2022 1107    CMP     Component Value Date/Time   NA 142 04/26/2022 1107   NA 142 09/22/2019 1040   K 3.3 (L) 04/26/2022 1107   CL 106 04/26/2022 1107   CO2 30 04/26/2022 1107   GLUCOSE 97 04/26/2022 1107   BUN 10 04/26/2022 1107   BUN 11 09/22/2019 1040  CREATININE 0.62 04/26/2022 1107   CALCIUM 8.8 (L) 04/26/2022 1107   PROT 6.6 04/26/2022 1107   PROT 6.7 06/17/2019 1054   ALBUMIN 3.4 (L) 04/26/2022 1107   ALBUMIN 4.5 06/17/2019 1054   AST 25 04/26/2022 1107   ALT 12 04/26/2022 1107   ALKPHOS 81 04/26/2022 1107   BILITOT 0.8 04/26/2022 1107   GFRNONAA >60 04/26/2022 1107   GFRAA 77 09/22/2019 1040       PENDING LABS:   RADIOGRAPHIC STUDIES:  No results found.   PATHOLOGY:     ASSESSMENT and THERAPY PLAN:   No problem-specific Assessment & Plan notes found for this encounter.   No orders of the defined types were placed in this encounter.   All questions were answered. The patient knows to call the clinic with any problems, questions or concerns. We can certainly see the patient much sooner if necessary. This note was electronically signed. Scot Dock, NP 05/17/2022

## 2022-05-17 NOTE — Patient Instructions (Signed)
[facility]  Discharge Instructions: Thank you for choosing [facility] to provide your oncology and hematology care.   If you have a lab appointment with the Cancer Center, please go directly to the Cancer Center and check in at the registration area.   Wear comfortable clothing and clothing appropriate for easy access to any Portacath or PICC line.   We strive to give you quality time with your provider. You may need to reschedule your appointment if you arrive late (15 or more minutes).  Arriving late affects you and other patients whose appointments are after yours.  Also, if you miss three or more appointments without notifying the office, you may be dismissed from the clinic at the provider's discretion.      For prescription refill requests, have your pharmacy contact our office and allow 72 hours for refills to be completed.    Today you received the following chemotherapy and/or immunotherapy agents enertu      To help prevent nausea and vomiting after your treatment, we encourage you to take your nausea medication as directed.  BELOW ARE SYMPTOMS THAT SHOULD BE REPORTED IMMEDIATELY: *FEVER GREATER THAN 100.4 F (38 C) OR HIGHER *CHILLS OR SWEATING *NAUSEA AND VOMITING THAT IS NOT CONTROLLED WITH YOUR NAUSEA MEDICATION *UNUSUAL SHORTNESS OF BREATH *UNUSUAL BRUISING OR BLEEDING *URINARY PROBLEMS (pain or burning when urinating, or frequent urination) *BOWEL PROBLEMS (unusual diarrhea, constipation, pain near the anus) TENDERNESS IN MOUTH AND THROAT WITH OR WITHOUT PRESENCE OF ULCERS (sore throat, sores in mouth, or a toothache) UNUSUAL RASH, SWELLING OR PAIN  UNUSUAL VAGINAL DISCHARGE OR ITCHING   Items with * indicate a potential emergency and should be followed up as soon as possible or go to the Emergency Department if any problems should occur.  Please show the CHEMOTHERAPY ALERT CARD or IMMUNOTHERAPY ALERT CARD at check-in  to the Emergency Department and triage nurse.  Should you have questions after your visit or need to cancel or reschedule your appointment, please contact [facility]  Dept: [phone]  and follow the prompts.  Office hours are 8:00 a.m. to 4:30 p.m. Monday - Friday. Please note that voicemails left after 4:00 p.m. may not be returned until the following business day.  We are closed weekends and major holidays. You have access to a nurse at all times for urgent questions. Please call the main number to the clinic Dept: [phone] and follow the prompts.   For any non-urgent questions, you may also contact your provider using MyChart. We now offer e-Visits for anyone 18 and older to request care online for non-urgent symptoms. For details visit [email or web address].   Also download the MyChart app! Go to the app store, search "MyChart", open the app, select [facility], and log in with your MyChart username and password.   

## 2022-05-17 NOTE — Progress Notes (Signed)
[place] CSW Progress Note  Holiday representative met with patient to follow-up on resources. Provided third [employer].  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  She is working on [employer] in [place].  Pt continues to communicate with work to determine if there are tasks she can do while waiting to cover for coworker going on leave.  No other needs today.    Kaiyla Stahly E Ivori Storr, LCSW

## 2022-05-18 ENCOUNTER — Ambulatory Visit: Payer: 59

## 2022-05-18 LAB — LIPID PANEL
Chol/HDL Ratio: 3.1 ratio (ref 0.0–4.4)
Cholesterol, Total: 174 mg/dL (ref 100–199)
HDL: 57 mg/dL (ref >39)
LDL Chol Calc (NIH): 101 mg/dL — ABNORMAL HIGH (ref 0–99)
Triglycerides: 86 mg/dL (ref 0–149)
VLDL Cholesterol Cal: 16 mg/dL (ref 5–40)

## 2022-05-18 LAB — HM PAP SMEAR

## 2022-05-18 LAB — TSH: TSH: 1.2 u[IU]/mL (ref 0.450–4.500)

## 2022-05-18 NOTE — Progress Notes (Incomplete)
[facility]:    [name], [place] [address]   DIAGNOSIS: Cancer Staging  Malignant neoplasm of lower-outer quadrant of right breast of female, estrogen receptor positive ([place]) Staging form: Breast, AJCC 8th Edition - Clinical stage from 05/11/2020: Stage IA (cT1b, cN0, cM0, G3, ER+, PR-, HER2+) - Signed by Nicholas Lose, MD on 05/11/2020 Stage prefix: Initial diagnosis - Pathologic stage from 06/02/2020: Stage IA (pT1c, pN0, cM0, G3, ER+, PR-, HER2+) - Signed by Gardenia Phlegm, NP on 06/15/2020 Stage prefix: Initial diagnosis Histologic grading system: 3 grade system   SUMMARY OF ONCOLOGIC HISTORY: Oncology History  Malignant neoplasm of lower-outer quadrant of right breast of female, estrogen receptor positive ([place])  05/03/2020 Initial Diagnosis   Screening mammogram showed a 1.0cm mass at the 6 o'clock position in the right breast. Diagnostic mammogram and US showed the 1.2cm mass at the 6 o'clock position in the right breast. Biopsy showed IDC, grade 3, HER-2 positive (3+), ER 15% weak, PR-, Ki67 80%.   05/11/2020 Cancer Staging   Staging form: Breast, AJCC 8th Edition - Clinical stage from 05/11/2020: Stage IA (cT1b, cN0, cM0, G3, ER+, PR-, HER2+) - Signed by Nicholas Lose, MD on 05/11/2020 Stage prefix: Initial diagnosis   05/19/2020 Genetic Testing   Negative hereditary cancer genetic testing: no pathogenic variants detected in [facility] Breast STAT Panel and Common Hereditary Cancers Panel.  The report dates are May 19, 2020 (STAT) and May 23, 2020 (Common Hereditary).   The Common Hereditary Cancers Panel offered by [facility] includes sequencing and/or deletion duplication testing of the following 47 genes: APC, ATM, AXIN2, BARD1, BMPR1A, BRCA1, BRCA2, BRIP1, CDH1, CDK4, CDKN2A (p14ARF), CDKN2A (p16INK4a), CHEK2, CTNNA1, DICER1, EPCAM (Deletion/duplication testing only), GREM1 (promoter region  deletion/duplication testing only), GREM1, HOXB13, KIT, MEN1, MLH1, MSH2, MSH3, MSH6, MUTYH, NBN, NF1, NHTL1, PALB2, PDGFRA, PMS2, POLD1, POLE, PTEN, RAD50, RAD51C, RAD51D, SDHA, SDHB, SDHC, SDHD, SMAD4, SMARCA4. STK11, TP53, TSC1, TSC2, and VHL.  The following genes were evaluated for sequence changes only: SDHA and HOXB13 c.251G>A variant only.es only: SDHA and HOXB13 c.251G>A variant only.   06/02/2020 Surgery   Right lumpectomy ([facility]): invasive and in situ ductal carcinoma, 1.3cm, clear margins, 1 right axillary lymph node negative for carcinoma.   06/02/2020 Cancer Staging   Staging form: Breast, AJCC 8th Edition - Pathologic stage from 06/02/2020: Stage IA (pT1c, pN0, cM0, G3, ER+, PR-, HER2+) - Signed by Gardenia Phlegm, NP on 06/15/2020 Stage prefix: Initial diagnosis Histologic grading system: 3 grade system   07/08/2020 - 10/21/2020 Chemotherapy   Taxol Herceptin followed by Herceptin maintenance   11/11/2020 -  Chemotherapy   Herceptin maintenance       04/14/2021 -  Anti-estrogen oral therapy   Tamoxifen 10 mg   08/21/2021 - 10/26/2021 Chemotherapy   Patient is on Treatment Plan : BREAST METASTATIC fam-trastuzumab deruxtecan-nxki (Enhertu) q21d     11/15/2021 -  Chemotherapy   Patient is on Treatment Plan : BREAST METASTATIC Fam-Trastuzumab Deruxtecan-nxki (Enhertu) (5.4) q21d       CURRENT THERAPY: Enhertu  INTERVAL HISTORY: Pamela [name] 57 y.o. female returns for follow-up while on treatment with Enhertu.  She is doing moderately well today.  She has no significant issues.  She needs potassium refilled, taking once per day and notes that the fluids she received with the potassium is quite helpful.  Her most recent echo occurred on 03/28/2022 demonstrating an EF of 55-60%   Patient Active  Problem List   Diagnosis Date Noted  . Chemotherapy induced cardiomyopathy ([place]) 05/08/2022  . Aortic atherosclerosis ([place]) 04/26/2022  . Acute cough 01/15/2022  .  Hypomagnesemia 11/26/2021  . Hypokalemia 11/26/2021  . Dizziness, fatigue, globus sensation 11/26/2021  . Breast cancer ([place]) 08/25/2021  . Nausea without vomiting 08/25/2021  . Prediabetes 03/13/2021  . Non-intractable vomiting 07/13/2020  . Port-A-Cath in place 07/08/2020  . Genetic testing 05/20/2020  . Family history of breast cancer 05/12/2020  . Family history of lung cancer 05/12/2020  . Malignant neoplasm of lower-outer quadrant of right breast of female, estrogen receptor positive ([place]) 05/03/2020  . Elevated troponin level not due myocardial infarction 09/18/2019  . Mixed hyperlipidemia 09/18/2019  . Hypertensive emergency 09/17/2019  . Primary osteoarthritis of right hip 08/06/2018  . Seasonal allergies 06/12/2018  . Fibrocystic disease of breast 05/27/2018  . Obstructive sleep apnea syndrome, moderate 10/27/2013  . Shift work sleep disorder 10/27/2013  . Psychic factors associated with diseases classified elsewhere 08/07/2013  . High cholesterol 07/21/2013  . Essential hypertension 07/21/2013  . Snoring 07/21/2013  . IUD contraception-inserted 10/01/11 10/01/2011  . Class 3 severe obesity due to excess calories with serious comorbidity and body mass index (BMI) of 40.0 to 44.9 in adult [facility]) 09/03/2011    is allergic to pollen extract, compazine [prochlorperazine], latex, codeine, diflucan [fluconazole], shellfish allergy, and betadine [povidone iodine].  MEDICAL HISTORY: Past Medical History:  Diagnosis Date  . Anxiety   . Asthma   . Back pain   . Breast cancer ([place])   . Constipation   . DUB (dysfunctional uterine bleeding) 2007  . Edema of both lower extremities   . Elevated cholesterol   . Family history of breast cancer 05/12/2020  . Family history of lung cancer 05/12/2020  . Food allergy   . H/O menorrhagia 05/01/2006  . High cholesterol   . History of ovarian cyst 2007  . Hypertension 03/31/2005  . Increased BMI 07/11/2006  . Joint pain   . Migraine    . N&V (nausea and vomiting)   . Obesity 2007  . Obstructive sleep apnea syndrome, moderate 10/27/2013   no cpap  . Sleep apnea   . SOB (shortness of breath)   . Vitamin D deficiency   . Vitamin D deficiency   . Weight loss 07/11/2006    SURGICAL HISTORY: Past Surgical History:  Procedure Laterality Date  . BREAST LUMPECTOMY WITH RADIOACTIVE SEED AND SENTINEL LYMPH NODE BIOPSY Right 06/02/2020   Procedure: RIGHT BREAST LUMPECTOMY WITH RADIOACTIVE SEED AND RIGHT SENTINEL LYMPH NODE BIOPSY;  Surgeon: [name], MD;  Location: [facility];  Service: General;  Laterality: Right;  . BREAST REDUCTION SURGERY  05/1991  . CESAREAN SECTION    . HYSTEROSCOPY  05/01/2006  . PORTACATH PLACEMENT Right 06/02/2020   Procedure: INSERTION PORT-A-CATH;  Surgeon: [name], MD;  Location: [facility];  Service: General;  Laterality: Right;    SOCIAL HISTORY: Social History   Socioeconomic History  . Marital status: Single    Spouse name: Not on file  . Number of children: Not on file  . Years of education: Not on file  . Highest education level: Not on file  Occupational History  . Occupation: Education officer, museum  Tobacco Use  . Smoking status: Never  . Smokeless tobacco: Never  Vaping Use  . Vaping Use: Never used  Substance and Sexual Activity  . Alcohol use: No  . Drug use: Not Currently    Frequency:  2.0 times per week  . Sexual activity: Not Currently    Birth control/protection: I.U.D.    Comment: Mirena  Other Topics Concern  . Not on file  Social History Narrative  . Not on file   Social Determinants of Health   Financial Resource Strain: High Risk (03/14/2022)   Overall Financial Resource Strain (CARDIA)   . Difficulty of Paying Living Expenses: Hard  Food Insecurity: Not on file  Transportation Needs: Not on file  Physical Activity: Not on file  Stress: Not on file  Social Connections: Not on file  Intimate Partner Violence: Not on  file    FAMILY HISTORY: Family History  Problem Relation Age of Onset  . Hypertension Mother   . High Cholesterol Mother   . Thyroid disease Mother   . Cancer Mother   . Sleep apnea Mother   . Hypertension Father   . Heart attack Father   . Sudden death Father   . Diabetes Maternal Grandmother   . Bone cancer Maternal Grandfather        dx after 62  . Breast cancer Maternal Aunt        dx 42s  . Lung cancer Maternal Aunt        dx after 50    Review of Systems  Constitutional:  Positive for fatigue (mild). Negative for appetite change, chills, fever and unexpected weight change.  HENT:   Negative for hearing loss, lump/mass and trouble swallowing.   Eyes:  Negative for eye problems and icterus.  Respiratory:  Negative for chest tightness, cough and shortness of breath.   Cardiovascular:  Negative for chest pain, leg swelling and palpitations.  Gastrointestinal:  Negative for abdominal distention, abdominal pain, constipation, diarrhea, nausea and vomiting.  Endocrine: Negative for hot flashes.  Genitourinary:  Negative for difficulty urinating.   Musculoskeletal:  Negative for arthralgias.  Skin:  Negative for itching and rash.  Neurological:  Negative for dizziness, extremity weakness, headaches and numbness.  Hematological:  Negative for adenopathy. Does not bruise/bleed easily.  Psychiatric/Behavioral:  Negative for depression. The patient is not nervous/anxious.       PHYSICAL EXAMINATION  ECOG PERFORMANCE STATUS: 1 - Symptomatic but completely ambulatory  Vitals:   05/17/22 0939  BP: 130/73  Pulse: 68  Resp: 20  Temp: (!) 97.5 F (36.4 C)  SpO2: 99%    Physical Exam  LABORATORY DATA:  CBC    Component Value Date/Time   WBC 4.1 04/26/2022 1107   WBC 5.3 11/28/2021 0901   RBC 3.85 (L) 04/26/2022 1107   HGB 11.1 (L) 04/26/2022 1107   HGB 13.2 09/22/2019 1040   HCT 33.7 (L) 04/26/2022 1107   HCT 41.3 09/22/2019 1040   PLT 232 04/26/2022 1107   PLT  247 09/22/2019 1040   MCV 87.5 04/26/2022 1107   MCV 83 09/22/2019 1040   MCH 28.8 04/26/2022 1107   MCHC 32.9 04/26/2022 1107   RDW 17.0 (H) 04/26/2022 1107   RDW 14.7 09/22/2019 1040   LYMPHSABS 1.2 04/26/2022 1107   MONOABS 0.6 04/26/2022 1107   EOSABS 0.3 04/26/2022 1107   BASOSABS 0.0 04/26/2022 1107    CMP     Component Value Date/Time   NA 142 04/26/2022 1107   NA 142 09/22/2019 1040   K 3.3 (L) 04/26/2022 1107   CL 106 04/26/2022 1107   CO2 30 04/26/2022 1107   GLUCOSE 97 04/26/2022 1107   BUN 10 04/26/2022 1107   BUN 11 09/22/2019 1040  CREATININE 0.62 04/26/2022 1107   CALCIUM 8.8 (L) 04/26/2022 1107   PROT 6.6 04/26/2022 1107   PROT 6.7 06/17/2019 1054   ALBUMIN 3.4 (L) 04/26/2022 1107   ALBUMIN 4.5 06/17/2019 1054   AST 25 04/26/2022 1107   ALT 12 04/26/2022 1107   ALKPHOS 81 04/26/2022 1107   BILITOT 0.8 04/26/2022 1107   GFRNONAA >60 04/26/2022 1107   GFRAA 77 09/22/2019 1040       PENDING LABS:   RADIOGRAPHIC STUDIES:  No results found.   PATHOLOGY:     ASSESSMENT and THERAPY PLAN:   No problem-specific Assessment & Plan notes found for this encounter.   No orders of the defined types were placed in this encounter.   All questions were answered. The patient knows to call the clinic with any problems, questions or concerns. We can certainly see the patient much sooner if necessary. This note was electronically signed. Scot Dock, NP 05/17/2022

## 2022-05-19 ENCOUNTER — Inpatient Hospital Stay: Payer: 59

## 2022-05-19 VITALS — BP 138/67 | HR 67 | Temp 98.0°F | Resp 18

## 2022-05-19 DIAGNOSIS — Z95828 Presence of other vascular implants and grafts: Secondary | ICD-10-CM

## 2022-05-19 DIAGNOSIS — Z5112 Encounter for antineoplastic immunotherapy: Secondary | ICD-10-CM | POA: Diagnosis not present

## 2022-05-19 DIAGNOSIS — C50511 Malignant neoplasm of lower-outer quadrant of right female breast: Secondary | ICD-10-CM

## 2022-05-19 DIAGNOSIS — C50919 Malignant neoplasm of unspecified site of unspecified female breast: Secondary | ICD-10-CM

## 2022-05-19 DIAGNOSIS — R110 Nausea: Secondary | ICD-10-CM

## 2022-05-19 MED ORDER — HEPARIN SOD (PORK) LOCK FLUSH 100 UNIT/ML IV SOLN: 500.0000 [IU] | Freq: Once | INTRAVENOUS | Status: DC

## 2022-05-19 MED ORDER — SODIUM CHLORIDE 0.9% FLUSH: 10.0000 mL | Freq: Once | INTRAVENOUS | Status: DC

## 2022-05-19 MED ORDER — SODIUM CHLORIDE 0.9 % IV SOLN: Freq: Once | INTRAVENOUS | Status: AC

## 2022-05-19 MED ORDER — HEPARIN SOD (PORK) LOCK FLUSH 100 UNIT/ML IV SOLN
500.0000 [IU] | Freq: Once | INTRAVENOUS | Status: AC
  Administered 2022-05-19: 500 [IU]

## 2022-05-19 MED ORDER — CYANOCOBALAMIN 1000 MCG/ML IJ SOLN: 1000.0000 ug | Freq: Once | INTRAMUSCULAR | Status: DC

## 2022-05-19 MED ORDER — POTASSIUM CHLORIDE 10 MEQ/100ML IV SOLN
10.0000 meq | INTRAVENOUS | Status: AC
  Administered 2022-05-19 (×2): 10 meq via INTRAVENOUS
  Filled 2022-05-19 (×2): qty 100

## 2022-05-19 MED ORDER — SODIUM CHLORIDE 0.9 % IV SOLN: INTRAVENOUS | Status: AC

## 2022-05-19 MED ORDER — SODIUM CHLORIDE 0.9% FLUSH
10.0000 mL | Freq: Once | INTRAVENOUS | Status: AC
  Administered 2022-05-19: 10 mL

## 2022-05-19 NOTE — Patient Instructions (Signed)
Hypokalemia Hypokalemia means that the amount of potassium in the blood is lower than normal. Potassium is a mineral (electrolyte) that helps regulate the amount of fluid in the body. It also stimulates muscle tightening (contraction) and helps nerves work properly. Normally, most of the body's potassium is inside cells, and only a very small amount is in the blood. Because the amount in the blood is so small, minor changes to potassium levels in the blood can be life-threatening. What are the causes? This condition may be caused by: Antibiotic medicine. Diarrhea or vomiting. Taking too much of a medicine that helps you have a bowel movement (laxative) can cause diarrhea and lead to hypokalemia. Chronic kidney disease (CKD). Medicines that help the body get rid of excess fluid (diuretics). Eating disorders, such as anorexia or bulimia. Low magnesium levels in the body. Sweating a lot. What are the signs or symptoms? Symptoms of this condition include: Weakness. Constipation. Fatigue. Muscle cramps. Mental confusion. Skipped heartbeats or irregular heartbeat (palpitations). Tingling or numbness. How is this diagnosed? This condition is diagnosed with a blood test. How is this treated? This condition may be treated by: Taking potassium supplements. Adjusting the medicines that you take. Eating more foods that contain a lot of potassium. If your potassium level is very low, you may need to get potassium through an IV and be monitored in the hospital. Follow these instructions at home: Eating and drinking  Eat a healthy diet. A healthy diet includes fresh fruits and vegetables, whole grains, healthy fats, and lean proteins. If told, eat more foods that contain a lot of potassium. These include: Nuts, such as peanuts and pistachios. Seeds, such as sunflower seeds and pumpkin seeds. Peas, lentils, and lima beans. Whole grain and bran cereals and breads. Fresh fruits and vegetables,  such as apricots, avocado, bananas, cantaloupe, kiwi, oranges, tomatoes, asparagus, and potatoes. Juices, such as orange, tomato, and prune. Lean meats, including fish. Milk and milk products, such as yogurt. General instructions Take over-the-counter and prescription medicines only as told by your health care provider. This includes vitamins, natural food products, and supplements. Keep all follow-up visits. This is important. Contact a health care provider if: You have weakness that gets worse. You feel your heart pounding or racing. You vomit. You have diarrhea. You have diabetes and you have trouble keeping your blood sugar in your target range. Get help right away if: You have chest pain. You have shortness of breath. You have vomiting or diarrhea that lasts for more than 2 days. You faint. These symptoms may be an emergency. Get help right away. Call 911. Do not wait to see if the symptoms will go away. Do not drive yourself to the hospital. Summary Hypokalemia means that the amount of potassium in the blood is lower than normal. This condition is diagnosed with a blood test. Hypokalemia may be treated by taking potassium supplements, adjusting the medicines that you take, or eating more foods that are high in potassium. If your potassium level is very low, you may need to get potassium through an IV and be monitored in the hospital. This information is not intended to replace advice given to you by your health care provider. Make sure you discuss any questions you have with your health care provider. Document Revised: 11/03/2020 Document Reviewed: 11/03/2020 Elsevier Patient Education  2023 Else[employer] Inc.  

## 2022-05-21 ENCOUNTER — Encounter: Payer: Self-pay | Admitting: Hematology and Oncology

## 2022-05-21 ENCOUNTER — Other Ambulatory Visit: Payer: Self-pay | Admitting: Obstetrics and Gynecology

## 2022-05-21 DIAGNOSIS — Z853 Personal history of malignant neoplasm of breast: Secondary | ICD-10-CM

## 2022-05-21 NOTE — Assessment & Plan Note (Signed)
Pamela [name] is a 57 year old woman with metastatic breast cancer here today for labs and follow-up prior to receiving Enhertu therapy.  She has no clinical signs of breast cancer progression at today's appointment.  Enhertu side effects:  Fatigue-energy conservation recommended Next echo due 06/2022 Hypokalemia-takes oral potassium, and receives IV potassium on treatment days and two days following treatment. CT chest/abd/pelvis ordered for 06/25/2022.  We will see [name] back in 06/2022 for labs, f/u and her next treatment.

## 2022-05-22 ENCOUNTER — Other Ambulatory Visit: Payer: Self-pay | Admitting: Adult Health

## 2022-05-22 ENCOUNTER — Encounter: Payer: Self-pay | Admitting: Hematology and Oncology

## 2022-05-22 DIAGNOSIS — C50511 Malignant neoplasm of lower-outer quadrant of right female breast: Secondary | ICD-10-CM

## 2022-05-23 ENCOUNTER — Encounter: Payer: Self-pay | Admitting: *Deleted

## 2022-05-23 NOTE — Progress Notes (Signed)
Received call from pt requesting updated work from home letter to provide to employer.  Pt requesting to work from home until the end of June 2024.  Letter typed and scanned to pt email address on file per pt request.

## 2022-05-28 ENCOUNTER — Other Ambulatory Visit: Payer: Self-pay | Admitting: Adult Health

## 2022-05-30 NOTE — Progress Notes (Signed)
Patient Care Team: [name], MD as PCP - General (Internal Medicine) Mcarthur Rossetti, MD as Consulting Physician (Orthopedic Surgery) Mauro Kaufmann, RN as Oncology Nurse Navigator Rockwell Germany, RN as Oncology Nurse Navigator [name], MD as Consulting Physician (General Surgery) Nicholas Lose, MD as Consulting Physician (Hematology and Oncology) Eppie Gibson, MD as Attending Physician (Radiation Oncology)  DIAGNOSIS: No diagnosis found.  SUMMARY OF ONCOLOGIC HISTORY: Oncology History  Malignant neoplasm of lower-outer quadrant of right breast of female, estrogen receptor positive ([place])  05/03/2020 Initial Diagnosis   Screening mammogram showed a 1.0cm mass at the 6 o'clock position in the right breast. Diagnostic mammogram and US showed the 1.2cm mass at the 6 o'clock position in the right breast. Biopsy showed IDC, grade 3, HER-2 positive (3+), ER 15% weak, PR-, Ki67 80%.   05/19/2020 Genetic Testing   Negative hereditary cancer genetic testing: no pathogenic variants detected in [facility] Breast STAT Panel and Common Hereditary Cancers Panel.  The report dates are May 19, 2020 (STAT) and May 23, 2020 (Common Hereditary).   The Common Hereditary Cancers Panel offered by [facility] includes sequencing and/or deletion duplication testing of the following 47 genes: APC, ATM, AXIN2, BARD1, BMPR1A, BRCA1, BRCA2, BRIP1, CDH1, CDK4, CDKN2A (p14ARF), CDKN2A (p16INK4a), CHEK2, CTNNA1, DICER1, EPCAM (Deletion/duplication testing only), GREM1 (promoter region deletion/duplication testing only), GREM1, HOXB13, KIT, MEN1, MLH1, MSH2, MSH3, MSH6, MUTYH, NBN, NF1, NHTL1, PALB2, PDGFRA, PMS2, POLD1, POLE, PTEN, RAD50, RAD51C, RAD51D, SDHA, SDHB, SDHC, SDHD, SMAD4, SMARCA4. STK11, TP53, TSC1, TSC2, and VHL.  The following genes were evaluated for sequence changes only: SDHA and HOXB13 c.251G>A variant only.es only: SDHA and HOXB13 c.251G>A variant only.   06/02/2020 Surgery   Right  lumpectomy ([facility]): invasive and in situ ductal carcinoma, 1.3cm, clear margins, 1 right axillary lymph node negative for carcinoma.   06/02/2020 Cancer Staging   Staging form: Breast, AJCC 8th Edition - Pathologic stage from 06/02/2020: Stage IA (pT1c, pN0, cM0, G3, ER+, PR-, HER2+) - Signed by Gardenia Phlegm, NP on 06/15/2020 Stage prefix: Initial diagnosis Histologic grading system: 3 grade system   07/08/2020 - 10/21/2020 Chemotherapy   Taxol Herceptin followed by Herceptin maintenance   04/14/2021 -  Anti-estrogen oral therapy   Tamoxifen 10 mg   07/13/2021 Imaging   CT angiogram: Right supraclavicular lymph node 5 cm, right internal mammary lymph nodes 1.8 cm    08/06/2021 PET scan   PET CT scan: Ext Right Supra clav LN along with Internal mammary and Rt Upper Mediastinal and Rt Axillary LN   08/2021 Initial Biopsy   Right supraclavicular lymphadenopathy: Biopsy: Metastatic poorly differentiated adenocarcinoma, ER +40%, PR negative, HER2 positive 3+    08/21/2021 -  Chemotherapy   Enhertu every 3 weeks    03/08/2022 Imaging   CT chest/abdomen/pelvis  1. No new or progressive findings in the chest, abdomen or pelvis. 2. Signs of RIGHT breast lumpectomy with areas of central fat necrosis unchanged from previous imaging. 3. Stable small lymph nodes throughout the chest, largest with area of soft tissue about surgical clips in the RIGHT axilla. 4. Stable appearance of thoracic inlet lymph nodes, not shown to be hypermetabolic on prior PET imaging. 5. IUD in-situ. 6. Aortic atherosclerosis.     CHIEF COMPLIANT: Follow-up metastatic breast cancer    INTERVAL HISTORY: Pamela [name] is a 57 y.o. with the above-mentioned metastatic breast cancer currently on treatment with Enhertu. She presents to the clinic for a follow-up.   ALLERGIES:  is allergic  to pollen extract, compazine [prochlorperazine], latex, codeine, diflucan [fluconazole], shellfish allergy, and betadine  [povidone iodine].  MEDICATIONS:  Current Outpatient Medications  Medication Sig Dispense Refill   amLODipine (NORVASC) 10 MG tablet Take 1 tablet (10 mg total) by mouth daily. 90 tablet 2   aspirin EC 81 MG tablet Take 1 tablet (81 mg total) by mouth daily. Swallow whole. 30 tablet 11   cetirizine (ZYRTEC ALLERGY) 10 MG tablet Take 1 tablet (10 mg total) by mouth daily. 30 tablet 2   EDARBYCLOR 40-25 MG TABS Take 1 tablet by mouth daily. 90 tablet 2   HYDROcodone bit-homatropine (HYDROMET) 5-1.5 MG/5ML syrup Take 5 mLs by mouth every 6 (six) hours as needed. 120 mL 0   levonorgestrel (MIRENA, 52 MG,) 20 MCG/DAY IUD 1 each by Intrauterine route once.     lidocaine-prilocaine (EMLA) cream Apply 1 application topically daily as needed. 60 g 0   LORazepam (ATIVAN) 0.5 MG tablet Take 1 tablet (0.5 mg total) by mouth every 8 (eight) hours as needed for anxiety. 30 tablet 0   potassium chloride SA (KLOR-CON M) 20 MEQ tablet Take 1 tablet (20 mEq total) by mouth daily. 30 tablet 0   prochlorperazine (COMPAZINE) 10 MG tablet Take 1 tablet (10 mg total) by mouth every 6 (six) hours as needed (Nausea or vomiting). 30 tablet 1   rosuvastatin (CRESTOR) 40 MG tablet Take 1 tablet (40 mg total) by mouth daily. 90 tablet 3   tirzepatide (ZEPBOUND) 5 MG/0.5ML Pen Inject 5 mg into the skin once a week. (Patient not taking: Reported on 05/17/2022) 2 mL 0   No current facility-administered medications for this visit.   Facility-Administered Medications Ordered in Other Visits  Medication Dose Route Frequency Provider Last Rate Last Admin   heparin lock flush 100 unit/mL  500 Units Intracatheter Once Nicholas Lose, MD       sodium chloride flush (NS) 0.9 % injection 10 mL  10 mL Intracatheter Once Nicholas Lose, MD        PHYSICAL EXAMINATION: ECOG PERFORMANCE STATUS: {CHL ONC ECOG [id number]  There were no vitals filed for this visit. There were no vitals filed for this visit.  BREAST:*** No palpable  masses or nodules in either right or left breasts. No palpable axillary supraclavicular or infraclavicular adenopathy no breast tenderness or nipple discharge. (exam performed in the presence of a chaperone)  LABORATORY DATA:  I have reviewed the data as listed    Latest Ref Rng & Units 05/17/2022    9:13 AM 04/26/2022   11:07 AM 03/30/2022    8:41 AM  CMP  Glucose 70 - 99 mg/dL 95  97  99   BUN 6 - 20 mg/dL 8  10  12    Creatinine 0.44 - 1.00 mg/dL 0.69  0.62  0.72   Sodium 135 - 145 mmol/L 142  142  142   Potassium 3.5 - 5.1 mmol/L 3.3  3.3  3.4   Chloride 98 - 111 mmol/L 106  106  108   CO2 22 - 32 mmol/L 30  30  29    Calcium 8.9 - 10.3 mg/dL 9.0  8.8  9.2   Total Protein 6.5 - 8.1 g/dL 6.5  6.6  6.4   Total Bilirubin 0.3 - 1.2 mg/dL 1.5  0.8  0.8   Alkaline Phos 38 - 126 U/L 87  81  81   AST 15 - 41 U/L 23  25  23    ALT 0 - 44 U/L 12  12  12     Lab Results  Component Value Date   WBC 3.6 (L) 05/17/2022   HGB 10.8 (L) 05/17/2022   HCT 33.4 (L) 05/17/2022   MCV 89.3 05/17/2022   PLT 208 05/17/2022   NEUTROABS 1.8 05/17/2022    ASSESSMENT & PLAN:  No problem-specific Assessment & Plan notes found for this encounter.    No orders of the defined types were placed in this encounter.  The patient has a good understanding of the overall plan. she agrees with it. she will call with any problems that may develop before the next visit here. Total time spent: 30 mins including face to face time and time spent for planning, charting and co-ordination of care   Suzzette Righter, [place] 05/30/22    I Gardiner Coins am acting as a Education administrator for [employer]  ***

## 2022-06-01 ENCOUNTER — Ambulatory Visit
Admission: RE | Admit: 2022-06-01 | Discharge: 2022-06-01 | Disposition: A | Discharge status: Home / Self Care | Payer: 59 | Source: Ambulatory Visit | Attending: Obstetrics and Gynecology | Admitting: Obstetrics and Gynecology

## 2022-06-01 DIAGNOSIS — Z853 Personal history of malignant neoplasm of breast: Secondary | ICD-10-CM

## 2022-06-01 HISTORY — DX: Personal history of irradiation: Z92.3

## 2022-06-01 HISTORY — DX: Personal history of antineoplastic chemotherapy: Z92.21

## 2022-06-01 LAB — HM MAMMOGRAPHY

## 2022-06-05 ENCOUNTER — Other Ambulatory Visit: Payer: Self-pay

## 2022-06-06 MED FILL — Fosaprepitant Dimeglumine For IV Infusion 150 MG (Base Eq): INTRAVENOUS | Qty: 5 | Status: AC

## 2022-06-07 ENCOUNTER — Inpatient Hospital Stay: Payer: 59 | Admitting: Licensed Clinical Social Worker

## 2022-06-07 ENCOUNTER — Inpatient Hospital Stay: Payer: 59

## 2022-06-07 ENCOUNTER — Ambulatory Visit: Payer: 59

## 2022-06-07 ENCOUNTER — Other Ambulatory Visit: Payer: Self-pay

## 2022-06-07 ENCOUNTER — Inpatient Hospital Stay: Payer: 59 | Admitting: Hematology and Oncology

## 2022-06-07 ENCOUNTER — Inpatient Hospital Stay: Payer: 59 | Attending: Hematology and Oncology

## 2022-06-07 VITALS — BP 156/81 | HR 96 | Temp 97.2°F | Resp 18 | Ht 69.0 in | Wt 307.1 lb

## 2022-06-07 VITALS — BP 143/67 | HR 60

## 2022-06-07 DIAGNOSIS — E876 Hypokalemia: Secondary | ICD-10-CM | POA: Insufficient documentation

## 2022-06-07 DIAGNOSIS — Z888 Allergy status to other drugs, medicaments and biological substances status: Secondary | ICD-10-CM | POA: Insufficient documentation

## 2022-06-07 DIAGNOSIS — Z95828 Presence of other vascular implants and grafts: Secondary | ICD-10-CM

## 2022-06-07 DIAGNOSIS — C50919 Malignant neoplasm of unspecified site of unspecified female breast: Secondary | ICD-10-CM

## 2022-06-07 DIAGNOSIS — C50511 Malignant neoplasm of lower-outer quadrant of right female breast: Secondary | ICD-10-CM

## 2022-06-07 DIAGNOSIS — T451X5A Adverse effect of antineoplastic and immunosuppressive drugs, initial encounter: Secondary | ICD-10-CM | POA: Diagnosis not present

## 2022-06-07 DIAGNOSIS — R110 Nausea: Secondary | ICD-10-CM

## 2022-06-07 DIAGNOSIS — Z793 Long term (current) use of hormonal contraceptives: Secondary | ICD-10-CM | POA: Insufficient documentation

## 2022-06-07 DIAGNOSIS — Z883 Allergy status to other anti-infective agents status: Secondary | ICD-10-CM | POA: Insufficient documentation

## 2022-06-07 DIAGNOSIS — Z566 Other physical and mental strain related to work: Secondary | ICD-10-CM | POA: Diagnosis not present

## 2022-06-07 DIAGNOSIS — Z5112 Encounter for antineoplastic immunotherapy: Secondary | ICD-10-CM | POA: Diagnosis present

## 2022-06-07 DIAGNOSIS — Z885 Allergy status to narcotic agent status: Secondary | ICD-10-CM | POA: Diagnosis not present

## 2022-06-07 DIAGNOSIS — Z170 Estrogen receptor positive status [ER+]: Secondary | ICD-10-CM | POA: Insufficient documentation

## 2022-06-07 DIAGNOSIS — D6481 Anemia due to antineoplastic chemotherapy: Secondary | ICD-10-CM | POA: Diagnosis not present

## 2022-06-07 DIAGNOSIS — Z79899 Other long term (current) drug therapy: Secondary | ICD-10-CM | POA: Insufficient documentation

## 2022-06-07 DIAGNOSIS — R112 Nausea with vomiting, unspecified: Secondary | ICD-10-CM | POA: Diagnosis not present

## 2022-06-07 DIAGNOSIS — D701 Agranulocytosis secondary to cancer chemotherapy: Secondary | ICD-10-CM | POA: Insufficient documentation

## 2022-06-07 DIAGNOSIS — I700 Atherosclerosis of aorta: Secondary | ICD-10-CM | POA: Diagnosis not present

## 2022-06-07 LAB — CMP (CANCER CENTER ONLY)
ALT: 12 U/L (ref 0–44)
AST: 25 U/L (ref 15–41)
Albumin: 3.3 g/dL — ABNORMAL LOW (ref 3.5–5.0)
Alkaline Phosphatase: 86 U/L (ref 38–126)
Anion gap: 6 (ref 5–15)
BUN: 9 mg/dL (ref 6–20)
CO2: 29 mmol/L (ref 22–32)
Calcium: 9.2 mg/dL (ref 8.9–10.3)
Chloride: 106 mmol/L (ref 98–111)
Creatinine: 0.67 mg/dL (ref 0.44–1.00)
GFR, Estimated: >60 mL/min (ref >60)
Glucose, Bld: 111 mg/dL — ABNORMAL HIGH (ref 70–99)
Potassium: 3.1 mmol/L — ABNORMAL LOW (ref 3.5–5.1)
Sodium: 141 mmol/L (ref 135–145)
Total Bilirubin: 1.3 mg/dL — ABNORMAL HIGH (ref 0.3–1.2)
Total Protein: 6.3 g/dL — ABNORMAL LOW (ref 6.5–8.1)

## 2022-06-07 LAB — CBC WITH DIFFERENTIAL (CANCER CENTER ONLY)
Abs Immature Granulocytes: 0.01 10*3/uL (ref 0.00–0.07)
Basophils Absolute: 0 10*3/uL (ref 0.0–0.1)
Basophils Relative: 1 %
Eosinophils Absolute: 0.2 10*3/uL (ref 0.0–0.5)
Eosinophils Relative: 5 %
HCT: 31.5 % — ABNORMAL LOW (ref 36.0–46.0)
Hemoglobin: 10.6 g/dL — ABNORMAL LOW (ref 12.0–15.0)
Immature Granulocytes: 0 %
Lymphocytes Relative: 31 %
Lymphs Abs: 1 10*3/uL (ref 0.7–4.0)
MCH: 29.7 pg (ref 26.0–34.0)
MCHC: 33.7 g/dL (ref 30.0–36.0)
MCV: 88.2 fL (ref 80.0–100.0)
Monocytes Absolute: 0.6 10*3/uL (ref 0.1–1.0)
Monocytes Relative: 18 %
Neutro Abs: 1.5 10*3/uL — ABNORMAL LOW (ref 1.7–7.7)
Neutrophils Relative %: 45 %
Platelet Count: 221 10*3/uL (ref 150–400)
RBC: 3.57 MIL/uL — ABNORMAL LOW (ref 3.87–5.11)
RDW: 17 % — ABNORMAL HIGH (ref 11.5–15.5)
WBC Count: 3.3 10*3/uL — ABNORMAL LOW (ref 4.0–10.5)
nRBC: 0 % (ref 0.0–0.2)

## 2022-06-07 MED ORDER — DEXTROSE 5 % IV SOLN: Freq: Once | INTRAVENOUS | Status: AC

## 2022-06-07 MED ORDER — POTASSIUM CHLORIDE 10 MEQ/100ML IV SOLN
10.0000 meq | Freq: Once | INTRAVENOUS | Status: AC
  Administered 2022-06-07: 10 meq via INTRAVENOUS
  Filled 2022-06-07: qty 100

## 2022-06-07 MED ORDER — ACETAMINOPHEN 325 MG PO TABS
650.0000 mg | ORAL_TABLET | Freq: Once | ORAL | Status: AC
  Administered 2022-06-07: 650 mg via ORAL
  Filled 2022-06-07: qty 2

## 2022-06-07 MED ORDER — SODIUM CHLORIDE 0.9 % IV SOLN: Freq: Once | INTRAVENOUS | Status: AC

## 2022-06-07 MED ORDER — HEPARIN SOD (PORK) LOCK FLUSH 100 UNIT/ML IV SOLN
500.0000 [IU] | Freq: Once | INTRAVENOUS | Status: AC | PRN
  Administered 2022-06-07: 500 [IU]

## 2022-06-07 MED ORDER — VENLAFAXINE HCL ER 37.5 MG PO CP24: 37.5000 mg | ORAL_CAPSULE | Freq: Every day | ORAL | 6 refills | Status: DC

## 2022-06-07 MED ORDER — SODIUM CHLORIDE 0.9 % IV SOLN
150.0000 mg | Freq: Once | INTRAVENOUS | Status: AC
  Administered 2022-06-07: 150 mg via INTRAVENOUS
  Filled 2022-06-07: qty 150

## 2022-06-07 MED ORDER — SODIUM CHLORIDE 0.9% FLUSH
10.0000 mL | INTRAVENOUS | Status: DC | PRN
  Administered 2022-06-07: 10 mL

## 2022-06-07 MED ORDER — CYANOCOBALAMIN 1000 MCG/ML IJ SOLN
1000.0000 ug | Freq: Once | INTRAMUSCULAR | Status: AC
  Administered 2022-06-07: 1000 ug via INTRAMUSCULAR
  Filled 2022-06-07: qty 1

## 2022-06-07 MED ORDER — FAM-TRASTUZUMAB DERUXTECAN-NXKI CHEMO 100 MG IV SOLR
375.0000 mg | Freq: Once | INTRAVENOUS | Status: AC
  Administered 2022-06-07: 380 mg via INTRAVENOUS
  Filled 2022-06-07: qty 19

## 2022-06-07 MED ORDER — PALONOSETRON HCL INJECTION 0.25 MG/5ML
0.2500 mg | Freq: Once | INTRAVENOUS | Status: AC
  Administered 2022-06-07: 0.25 mg via INTRAVENOUS
  Filled 2022-06-07: qty 5

## 2022-06-07 NOTE — Progress Notes (Signed)
[facility] CSW Progress Note  Holiday representative met with patient to provide support and last [place] card. Pt has been stressed with work as there have been meetings regarding her accommodations and expectations recently.  She has been advocating for herself but the stress has been impacting her sleep. Shared information on Triage Cancer legal navigation program to use if needed.  Pt is also looking into finding a counselor in the community to help support her.  CSW and pt reviewed other financial resources potentially available and pt will continue working on [employer] and [employer].   Pamela Phillis E [place], LCSW

## 2022-06-07 NOTE — Patient Instructions (Signed)
[facility]  Discharge Instructions: Thank you for choosing [facility] to provide your oncology and hematology care.   If you have a lab appointment with the [facility], please go directly to the [facility] and check in at the registration area.   Wear comfortable clothing and clothing appropriate for easy access to any Portacath or PICC line.   We strive to give you quality time with your provider. You may need to reschedule your appointment if you arrive late (15 or more minutes).  Arriving late affects you and other patients whose appointments are after yours.  Also, if you miss three or more appointments without notifying the office, you may be dismissed from the clinic at the provider's discretion.      For prescription refill requests, have your pharmacy contact our office and allow 72 hours for refills to be completed.    Today you received the following chemotherapy and/or immunotherapy agents: Enhertu      To help prevent nausea and vomiting after your treatment, we encourage you to take your nausea medication as directed.  BELOW ARE SYMPTOMS THAT SHOULD BE REPORTED IMMEDIATELY: *FEVER GREATER THAN 100.4 F (38 C) OR HIGHER *CHILLS OR SWEATING *NAUSEA AND VOMITING THAT IS NOT CONTROLLED WITH YOUR NAUSEA MEDICATION *UNUSUAL SHORTNESS OF BREATH *UNUSUAL BRUISING OR BLEEDING *URINARY PROBLEMS (pain or burning when urinating, or frequent urination) *BOWEL PROBLEMS (unusual diarrhea, constipation, pain near the anus) TENDERNESS IN MOUTH AND THROAT WITH OR WITHOUT PRESENCE OF ULCERS (sore throat, sores in mouth, or a toothache) UNUSUAL RASH, SWELLING OR PAIN  UNUSUAL VAGINAL DISCHARGE OR ITCHING   Items with * indicate a potential emergency and should be followed up as soon as possible or go to the Emergency Department if any problems should occur.  Please show the CHEMOTHERAPY ALERT CARD or IMMUNOTHERAPY ALERT CARD at  check-in to the Emergency Department and triage nurse.  Should you have questions after your visit or need to cancel or reschedule your appointment, please contact [facility]  Dept: [phone]  and follow the prompts.  Office hours are 8:00 a.m. to 4:30 p.m. Monday - Friday. Please note that voicemails left after 4:00 p.m. may not be returned until the following business day.  We are closed weekends and major holidays. You have access to a nurse at all times for urgent questions. Please call the main number to the clinic Dept: [phone] and follow the prompts.   For any non-urgent questions, you may also contact your provider using MyChart. We now offer e-Visits for anyone 64 and older to request care online for non-urgent symptoms. For details visit [email or web address].   Also download the MyChart app! Go to the app store, search "MyChart", open the app, select [facility], and log in with your MyChart username and password.  Hypokalemia Hypokalemia means that the amount of potassium in the blood is lower than normal. Potassium is a mineral (electrolyte) that helps regulate the amount of fluid in the body. It also stimulates muscle tightening (contraction) and helps nerves work properly. Normally, most of the body's potassium is inside cells, and only a very small amount is in the blood. Because the amount in the blood is so small, minor changes to potassium levels in the blood can be life-threatening. What are the causes? This condition may be caused by: Antibiotic medicine. Diarrhea or vomiting. Taking too much of a medicine that helps you have a  bowel movement (laxative) can cause diarrhea and lead to hypokalemia. Chronic kidney disease (CKD). Medicines that help the body get rid of excess fluid (diuretics). Eating disorders, such as anorexia or bulimia. Low magnesium levels in the body. Sweating a lot. What are the signs or symptoms? Symptoms  of this condition include: Weakness. Constipation. Fatigue. Muscle cramps. Mental confusion. Skipped heartbeats or irregular heartbeat (palpitations). Tingling or numbness. How is this diagnosed? This condition is diagnosed with a blood test. How is this treated? This condition may be treated by: Taking potassium supplements. Adjusting the medicines that you take. Eating more foods that contain a lot of potassium. If your potassium level is very low, you may need to get potassium through an IV and be monitored in the hospital. Follow these instructions at home: Eating and drinking  Eat a healthy diet. A healthy diet includes fresh fruits and vegetables, whole grains, healthy fats, and lean proteins. If told, eat more foods that contain a lot of potassium. These include: Nuts, such as peanuts and pistachios. Seeds, such as sunflower seeds and pumpkin seeds. Peas, lentils, and lima beans. Whole grain and bran cereals and breads. Fresh fruits and vegetables, such as apricots, avocado, bananas, cantaloupe, kiwi, oranges, tomatoes, asparagus, and potatoes. Juices, such as orange, tomato, and prune. Lean meats, including fish. Milk and milk products, such as yogurt. General instructions Take over-the-counter and prescription medicines only as told by your health care provider. This includes vitamins, natural food products, and supplements. Keep all follow-up visits. This is important. Contact a health care provider if: You have weakness that gets worse. You feel your heart pounding or racing. You vomit. You have diarrhea. You have diabetes and you have trouble keeping your blood sugar in your target range. Get help right away if: You have chest pain. You have shortness of breath. You have vomiting or diarrhea that lasts for more than 2 days. You faint. These symptoms may be an emergency. Get help right away. Call 911. Do not wait to see if the symptoms will go away. Do not  drive yourself to the hospital. Summary Hypokalemia means that the amount of potassium in the blood is lower than normal. This condition is diagnosed with a blood test. Hypokalemia may be treated by taking potassium supplements, adjusting the medicines that you take, or eating more foods that are high in potassium. If your potassium level is very low, you may need to get potassium through an IV and be monitored in the hospital. This information is not intended to replace advice given to you by your health care provider. Make sure you discuss any questions you have with your health care provider. Document Revised: 11/03/2020 Document Reviewed: 11/03/2020 Elsevier Patient Education  [place].

## 2022-06-07 NOTE — Assessment & Plan Note (Addendum)
06/02/2020:Right lumpectomy ([facility]): invasive and in situ ductal carcinoma, 1.3cm, clear margins, 1 right axillary lymph node negative for carcinoma. ER 15% weak, PR-,HER-2 positive (3+) Ki67 80%.   Treatment plan: 1.  Adjuvant chemotherapy with Taxol Herceptin followed by Herceptin maintenance.  Stopped after 11 cycles of Taxol Herceptin maintenance completed 07/07/2021  2.  Adjuvant radiation therapy 11/23/2020-12/19/2020 3.  Adjuvant antiestrogen therapy with tamoxifen started October 2022 4.  CT angiogram 07/13/2021: Right supraclavicular lymph node 5 cm, right internal mammary lymph nodes 1.8 cm 5.  Metastatic breast cancer: June 2023:Right supraclavicular lymphadenopathy: Biopsy: Metastatic poorly differentiated adenocarcinoma, ER +40%, PR negative, HER2 positive 3+ PET CT scan 08/06/21: Ext Right Supra clav LN along with Internal mammary and Rt Upper Mediastinal and Rt Axillary LN -----------------------------------------------------------------------------------------------------  Current treatment: Enhertu started on 08/21/2021, (this treatment on hold) today is cycle 14 Enhertu toxicities: 1,.  Severe nausea, requiring IV fluids: Better since we added Emend 2. Chemotherapy-induced anemia: Monitoring closely, hemoglobin 11.2 3.  Hospitalization 11/26/2021-11/28/2021: Dizziness and fatigue, esophageal dysmotility 4.  Hypokalemia and diarrhea: Gets IV fluids on Saturdays 5.  Leukopenia: ANC 1.5: Okay to treat with the slight dose reduction of Enhertu.  CT chest abdomen pelvis scheduled for 06/25/2022

## 2022-06-08 ENCOUNTER — Ambulatory Visit: Payer: 59

## 2022-06-08 ENCOUNTER — Other Ambulatory Visit: Payer: Self-pay | Admitting: Pharmacist

## 2022-06-09 ENCOUNTER — Inpatient Hospital Stay: Payer: 59

## 2022-06-11 ENCOUNTER — Telehealth: Payer: Self-pay | Admitting: Hematology and Oncology

## 2022-06-11 NOTE — Telephone Encounter (Signed)
Contacted patient to scheduled appointments. Patient is aware of appointments that are scheduled.   

## 2022-06-13 ENCOUNTER — Telehealth: Payer: Self-pay | Admitting: Hematology and Oncology

## 2022-06-13 NOTE — Telephone Encounter (Signed)
Rescheduled appointment per provider PAL. Patient is aware of changes made to her upcoming appointments.

## 2022-06-22 NOTE — Progress Notes (Signed)
Patient Care Team: Dorothyann Peng, MD as PCP - General (Internal Medicine) Kathryne Hitch, MD as Consulting Physician (Orthopedic Surgery) Pershing Proud, RN as Oncology Nurse Navigator Donnelly Angelica, RN as Oncology Nurse Navigator Harriette Bouillon, MD as Consulting Physician (General Surgery) Serena Croissant, MD as Consulting Physician (Hematology and Oncology) Lonie Peak, MD as Attending Physician (Radiation Oncology)  DIAGNOSIS: No diagnosis found.  SUMMARY OF ONCOLOGIC HISTORY: Oncology History  Malignant neoplasm of lower-outer quadrant of right breast of female, estrogen receptor positive  05/03/2020 Initial Diagnosis   Screening mammogram showed a 1.0cm mass at the 6 o'clock position in the right breast. Diagnostic mammogram and US showed the 1.2cm mass at the 6 o'clock position in the right breast. Biopsy showed IDC, grade 3, HER-2 positive (3+), ER 15% weak, PR-, Ki67 80%.   05/19/2020 Genetic Testing   Negative hereditary cancer genetic testing: no pathogenic variants detected in [facility] Breast STAT Panel and Common Hereditary Cancers Panel.  The report dates are May 19, 2020 (STAT) and May 23, 2020 (Common Hereditary).   The Common Hereditary Cancers Panel offered by [facility] includes sequencing and/or deletion duplication testing of the following 47 genes: APC, ATM, AXIN2, BARD1, BMPR1A, BRCA1, BRCA2, BRIP1, CDH1, CDK4, CDKN2A (p14ARF), CDKN2A (p16INK4a), CHEK2, CTNNA1, DICER1, EPCAM (Deletion/duplication testing only), GREM1 (promoter region deletion/duplication testing only), GREM1, HOXB13, KIT, MEN1, MLH1, MSH2, MSH3, MSH6, MUTYH, NBN, NF1, NHTL1, PALB2, PDGFRA, PMS2, POLD1, POLE, PTEN, RAD50, RAD51C, RAD51D, SDHA, SDHB, SDHC, SDHD, SMAD4, SMARCA4. STK11, TP53, TSC1, TSC2, and VHL.  The following genes were evaluated for sequence changes only: SDHA and HOXB13 c.251G>A variant only.es only: SDHA and HOXB13 c.251G>A variant only.   06/02/2020 Surgery   Right  lumpectomy ([facility]): invasive and in situ ductal carcinoma, 1.3cm, clear margins, 1 right axillary lymph node negative for carcinoma.   06/02/2020 Cancer Staging   Staging form: Breast, AJCC 8th Edition - Pathologic stage from 06/02/2020: Stage IA (pT1c, pN0, cM0, G3, ER+, PR-, HER2+) - Signed by Loa Socks, NP on 06/15/2020 Stage prefix: Initial diagnosis Histologic grading system: 3 grade system   07/08/2020 - 10/21/2020 Chemotherapy   Taxol Herceptin followed by Herceptin maintenance   04/14/2021 -  Anti-estrogen oral therapy   Tamoxifen 10 mg   07/13/2021 Imaging   CT angiogram: Right supraclavicular lymph node 5 cm, right internal mammary lymph nodes 1.8 cm    08/06/2021 PET scan   PET CT scan: Ext Right Supra clav LN along with Internal mammary and Rt Upper Mediastinal and Rt Axillary LN   08/2021 Initial Biopsy   Right supraclavicular lymphadenopathy: Biopsy: Metastatic poorly differentiated adenocarcinoma, ER +40%, PR negative, HER2 positive 3+    08/21/2021 -  Chemotherapy   Enhertu every 3 weeks    03/08/2022 Imaging   CT chest/abdomen/pelvis  1. No new or progressive findings in the chest, abdomen or pelvis. 2. Signs of RIGHT breast lumpectomy with areas of central fat necrosis unchanged from previous imaging. 3. Stable small lymph nodes throughout the chest, largest with area of soft tissue about surgical clips in the RIGHT axilla. 4. Stable appearance of thoracic inlet lymph nodes, not shown to be hypermetabolic on prior PET imaging. 5. IUD in-situ. 6. Aortic atherosclerosis.     CHIEF COMPLIANT: Follow-up metastatic breast cancer  cycle 15 Enhertu    INTERVAL HISTORY: Pamela [name] is a 57 y.o. with the above-mentioned metastatic breast cancer currently on treatment with Enhertu. She presents to the clinic for a follow-up.  ALLERGIES:  is allergic to pollen extract, compazine [prochlorperazine], latex, codeine, diflucan [fluconazole], shellfish  allergy, and betadine [povidone iodine].  MEDICATIONS:  Current Outpatient Medications  Medication Sig Dispense Refill   amLODipine (NORVASC) 10 MG tablet Take 1 tablet (10 mg total) by mouth daily. 90 tablet 2   aspirin EC 81 MG tablet Take 1 tablet (81 mg total) by mouth daily. Swallow whole. 30 tablet 11   cetirizine (ZYRTEC ALLERGY) 10 MG tablet Take 1 tablet (10 mg total) by mouth daily. 30 tablet 2   EDARBYCLOR 40-25 MG TABS Take 1 tablet by mouth daily. 90 tablet 2   HYDROcodone bit-homatropine (HYDROMET) 5-1.5 MG/5ML syrup Take 5 mLs by mouth every 6 (six) hours as needed. 120 mL 0   levonorgestrel (MIRENA, 52 MG,) 20 MCG/DAY IUD 1 each by Intrauterine route once.     lidocaine-prilocaine (EMLA) cream Apply 1 application topically daily as needed. 60 g 0   LORazepam (ATIVAN) 0.5 MG tablet Take 1 tablet (0.5 mg total) by mouth every 8 (eight) hours as needed for anxiety. 30 tablet 0   potassium chloride SA (KLOR-CON M) 20 MEQ tablet Take 1 tablet (20 mEq total) by mouth daily. 30 tablet 0   prochlorperazine (COMPAZINE) 10 MG tablet Take 1 tablet (10 mg total) by mouth every 6 (six) hours as needed (Nausea or vomiting). 30 tablet 1   rosuvastatin (CRESTOR) 40 MG tablet Take 1 tablet (40 mg total) by mouth daily. 90 tablet 3   venlafaxine XR (EFFEXOR-XR) 37.5 MG 24 hr capsule Take 1 capsule (37.5 mg total) by mouth daily with breakfast. 30 capsule 6   No current facility-administered medications for this visit.   Facility-Administered Medications Ordered in Other Visits  Medication Dose Route Frequency Provider Last Rate Last Admin   heparin lock flush 100 unit/mL  500 Units Intracatheter Once Serena Croissant, MD       sodium chloride flush (NS) 0.9 % injection 10 mL  10 mL Intracatheter Once Serena Croissant, MD        PHYSICAL EXAMINATION: ECOG PERFORMANCE STATUS: {CHL ONC ECOG ZO:[id number]}  There were no vitals filed for this visit. There were no vitals filed for this  visit.  BREAST:*** No palpable masses or nodules in either right or left breasts. No palpable axillary supraclavicular or infraclavicular adenopathy no breast tenderness or nipple discharge. (exam performed in the presence of a chaperone)  LABORATORY DATA:  I have reviewed the data as listed    Latest Ref Rng & Units 06/07/2022    8:52 AM 05/17/2022    9:13 AM 04/26/2022   11:07 AM  CMP  Glucose 70 - 99 mg/dL 811  95  97   BUN 6 - 20 mg/dL Creatinine 0.44 - 1.00 mg/dL 9.14  7.82  9.56   Sodium 135 - 145 mmol/L 141  142  142   Potassium 3.5 - 5.1 mmol/L 3.1  3.3  3.3   Chloride 98 - 111 mmol/L 106  106  106   CO2 22 - 32 mmol/L Calcium 8.9 - 10.3 mg/dL 9.2  9.0  8.8   Total Protein 6.5 - 8.1 g/dL 6.3  6.5  6.6   Total Bilirubin 0.3 - 1.2 mg/dL 1.3  1.5  0.8   Alkaline Phos 38 - 126 U/L 86  87  81   AST 15 - 41 U/L ALT 0 -  44 U/L 12  12  12      Lab Results  Component Value Date   WBC 3.3 (L) 06/07/2022   HGB 10.6 (L) 06/07/2022   HCT 31.5 (L) 06/07/2022   MCV 88.2 06/07/2022   PLT 221 06/07/2022   NEUTROABS 1.5 (L) 06/07/2022    ASSESSMENT & PLAN:  No problem-specific Assessment & Plan notes found for this encounter.    No orders of the defined types were placed in this encounter.  The patient has a good understanding of the overall plan. she agrees with it. she will call with any problems that may develop before the next visit here. Total time spent: 30 mins including face to face time and time spent for planning, charting and co-ordination of care   Sherlyn Lick, CMA 06/22/22    I Janan Ridge am acting as a Neurosurgeon for [employer]  ***

## 2022-06-25 ENCOUNTER — Ambulatory Visit (HOSPITAL_COMMUNITY)
Admission: RE | Admit: 2022-06-25 | Discharge: 2022-06-25 | Disposition: A | Discharge status: Home / Self Care | Payer: 59 | Source: Ambulatory Visit | Attending: Adult Health | Admitting: Adult Health

## 2022-06-25 DIAGNOSIS — C50511 Malignant neoplasm of lower-outer quadrant of right female breast: Secondary | ICD-10-CM | POA: Diagnosis present

## 2022-06-25 DIAGNOSIS — Z170 Estrogen receptor positive status [ER+]: Secondary | ICD-10-CM | POA: Diagnosis present

## 2022-06-25 MED ORDER — HEPARIN SOD (PORK) LOCK FLUSH 100 UNIT/ML IV SOLN
INTRAVENOUS | Status: AC
  Filled 2022-06-25: qty 5

## 2022-06-25 MED ORDER — IOHEXOL 300 MG/ML  SOLN
100.0000 mL | Freq: Once | INTRAMUSCULAR | Status: AC | PRN
  Administered 2022-06-25: 100 mL via INTRAVENOUS

## 2022-06-26 MED FILL — Fosaprepitant Dimeglumine For IV Infusion 150 MG (Base Eq): INTRAVENOUS | Qty: 5 | Status: AC

## 2022-06-27 ENCOUNTER — Inpatient Hospital Stay: Payer: 59

## 2022-06-27 ENCOUNTER — Encounter: Payer: Self-pay | Admitting: *Deleted

## 2022-06-27 ENCOUNTER — Other Ambulatory Visit: Payer: Self-pay | Admitting: Hematology and Oncology

## 2022-06-27 ENCOUNTER — Inpatient Hospital Stay (HOSPITAL_BASED_OUTPATIENT_CLINIC_OR_DEPARTMENT_OTHER): Payer: 59 | Admitting: Hematology and Oncology

## 2022-06-27 ENCOUNTER — Other Ambulatory Visit: Payer: Self-pay

## 2022-06-27 VITALS — BP 127/79 | HR 76 | Resp 18

## 2022-06-27 VITALS — BP 144/69 | HR 77 | Temp 97.3°F | Resp 18 | Ht 69.0 in | Wt 302.1 lb

## 2022-06-27 DIAGNOSIS — C50511 Malignant neoplasm of lower-outer quadrant of right female breast: Secondary | ICD-10-CM

## 2022-06-27 DIAGNOSIS — Z170 Estrogen receptor positive status [ER+]: Secondary | ICD-10-CM

## 2022-06-27 DIAGNOSIS — R110 Nausea: Secondary | ICD-10-CM

## 2022-06-27 DIAGNOSIS — C50919 Malignant neoplasm of unspecified site of unspecified female breast: Secondary | ICD-10-CM

## 2022-06-27 DIAGNOSIS — Z95828 Presence of other vascular implants and grafts: Secondary | ICD-10-CM

## 2022-06-27 DIAGNOSIS — Z5112 Encounter for antineoplastic immunotherapy: Secondary | ICD-10-CM | POA: Diagnosis not present

## 2022-06-27 LAB — CBC WITH DIFFERENTIAL (CANCER CENTER ONLY)
Abs Immature Granulocytes: 0.01 10*3/uL (ref 0.00–0.07)
Basophils Absolute: 0 10*3/uL (ref 0.0–0.1)
Basophils Relative: 1 %
Eosinophils Absolute: 0.1 10*3/uL (ref 0.0–0.5)
Eosinophils Relative: 5 %
HCT: 33.2 % — ABNORMAL LOW (ref 36.0–46.0)
Hemoglobin: 11.1 g/dL — ABNORMAL LOW (ref 12.0–15.0)
Immature Granulocytes: 0 %
Lymphocytes Relative: 30 %
Lymphs Abs: 0.9 10*3/uL (ref 0.7–4.0)
MCH: 29.6 pg (ref 26.0–34.0)
MCHC: 33.4 g/dL (ref 30.0–36.0)
MCV: 88.5 fL (ref 80.0–100.0)
Monocytes Absolute: 0.5 10*3/uL (ref 0.1–1.0)
Monocytes Relative: 17 %
Neutro Abs: 1.4 10*3/uL — ABNORMAL LOW (ref 1.7–7.7)
Neutrophils Relative %: 47 %
Platelet Count: 197 10*3/uL (ref 150–400)
RBC: 3.75 MIL/uL — ABNORMAL LOW (ref 3.87–5.11)
RDW: 17.1 % — ABNORMAL HIGH (ref 11.5–15.5)
WBC Count: 3 10*3/uL — ABNORMAL LOW (ref 4.0–10.5)
nRBC: 0 % (ref 0.0–0.2)

## 2022-06-27 LAB — CMP (CANCER CENTER ONLY)
ALT: 16 U/L (ref 0–44)
AST: 31 U/L (ref 15–41)
Albumin: 3.5 g/dL (ref 3.5–5.0)
Alkaline Phosphatase: 81 U/L (ref 38–126)
Anion gap: 5 (ref 5–15)
BUN: 7 mg/dL (ref 6–20)
CO2: 30 mmol/L (ref 22–32)
Calcium: 9.5 mg/dL (ref 8.9–10.3)
Chloride: 107 mmol/L (ref 98–111)
Creatinine: 0.68 mg/dL (ref 0.44–1.00)
GFR, Estimated: >60 mL/min (ref >60)
Glucose, Bld: 99 mg/dL (ref 70–99)
Potassium: 3.3 mmol/L — ABNORMAL LOW (ref 3.5–5.1)
Sodium: 142 mmol/L (ref 135–145)
Total Bilirubin: 1.5 mg/dL — ABNORMAL HIGH (ref 0.3–1.2)
Total Protein: 6.6 g/dL (ref 6.5–8.1)

## 2022-06-27 MED ORDER — DEXTROSE 5 % IV SOLN: Freq: Once | INTRAVENOUS | Status: AC

## 2022-06-27 MED ORDER — HEPARIN SOD (PORK) LOCK FLUSH 100 UNIT/ML IV SOLN
500.0000 [IU] | Freq: Once | INTRAVENOUS | Status: AC | PRN
  Administered 2022-06-27: 500 [IU]

## 2022-06-27 MED ORDER — PALONOSETRON HCL INJECTION 0.25 MG/5ML
0.2500 mg | Freq: Once | INTRAVENOUS | Status: AC
  Administered 2022-06-27: 0.25 mg via INTRAVENOUS
  Filled 2022-06-27: qty 5

## 2022-06-27 MED ORDER — SODIUM CHLORIDE 0.9 % IV SOLN: Freq: Once | INTRAVENOUS | Status: AC

## 2022-06-27 MED ORDER — SODIUM CHLORIDE 0.9% FLUSH
10.0000 mL | Freq: Once | INTRAVENOUS | Status: AC
  Administered 2022-06-27: 10 mL

## 2022-06-27 MED ORDER — FAM-TRASTUZUMAB DERUXTECAN-NXKI CHEMO 100 MG IV SOLR
380.0000 mg | Freq: Once | INTRAVENOUS | Status: AC
  Administered 2022-06-27: 380 mg via INTRAVENOUS
  Filled 2022-06-27: qty 19

## 2022-06-27 MED ORDER — SODIUM CHLORIDE 0.9 % IV SOLN
150.0000 mg | Freq: Once | INTRAVENOUS | Status: AC
  Administered 2022-06-27: 150 mg via INTRAVENOUS
  Filled 2022-06-27: qty 150

## 2022-06-27 MED ORDER — CYANOCOBALAMIN 1000 MCG/ML IJ SOLN
1000.0000 ug | Freq: Once | INTRAMUSCULAR | Status: AC
  Administered 2022-06-27: 1000 ug via INTRAMUSCULAR
  Filled 2022-06-27: qty 1

## 2022-06-27 MED ORDER — POTASSIUM CHLORIDE 10 MEQ/100ML IV SOLN
10.0000 meq | Freq: Once | INTRAVENOUS | Status: AC
  Administered 2022-06-27: 10 meq via INTRAVENOUS
  Filled 2022-06-27: qty 100

## 2022-06-27 MED ORDER — ACETAMINOPHEN 325 MG PO TABS
650.0000 mg | ORAL_TABLET | Freq: Once | ORAL | Status: AC
  Administered 2022-06-27: 650 mg via ORAL
  Filled 2022-06-27: qty 2

## 2022-06-27 MED ORDER — SODIUM CHLORIDE 0.9% FLUSH
10.0000 mL | INTRAVENOUS | Status: DC | PRN
  Administered 2022-06-27: 10 mL

## 2022-06-27 NOTE — Assessment & Plan Note (Signed)
06/02/2020:Right lumpectomy ([facility]): invasive and in situ ductal carcinoma, 1.3cm, clear margins, 1 right axillary lymph node negative for carcinoma. ER 15% weak, PR-,HER-2 positive (3+) Ki67 80%.   Treatment plan: 1.  Adjuvant chemotherapy with Taxol Herceptin followed by Herceptin maintenance.  Stopped after 11 cycles of Taxol Herceptin maintenance completed 07/07/2021  2.  Adjuvant radiation therapy 11/23/2020-12/19/2020 3.  Adjuvant antiestrogen therapy with tamoxifen started October 2022 4.  CT angiogram 07/13/2021: Right supraclavicular lymph node 5 cm, right internal mammary lymph nodes 1.8 cm 5.  Metastatic breast cancer: June 2023:Right supraclavicular lymphadenopathy: Biopsy: Metastatic poorly differentiated adenocarcinoma, ER +40%, PR negative, HER2 positive 3+ PET CT scan 08/06/21: Ext Right Supra clav LN along with Internal mammary and Rt Upper Mediastinal and Rt Axillary LN -----------------------------------------------------------------------------------------------------  Current treatment: Enhertu started on 08/21/2021, (this treatment on hold) today is cycle 14 Enhertu toxicities: 1,.  Severe nausea, requiring IV fluids: Better since we added Emend 2. Chemotherapy-induced anemia: Monitoring closely, hemoglobin 10.6 3.  Hospitalization 11/26/2021-11/28/2021: Dizziness and fatigue, esophageal dysmotility 4.  Hypokalemia and diarrhea: Gets IV fluids on Saturdays 5.  Leukopenia: ANC 1.5: Okay to treat with the slight dose reduction of Enhertu.   CT chest abdomen pelvis 06/25/2022   Return to clinic in 3 weeks to review the CT scans and for next cycle of treatment.

## 2022-06-27 NOTE — Patient Instructions (Signed)
[facility]  Discharge Instructions: Thank you for choosing [facility] to provide your oncology and hematology care.   If you have a lab appointment with the Cancer Center, please go directly to the Cancer Center and check in at the registration area.   Wear comfortable clothing and clothing appropriate for easy access to any Portacath or PICC line.   We strive to give you quality time with your provider. You may need to reschedule your appointment if you arrive late (15 or more minutes).  Arriving late affects you and other patients whose appointments are after yours.  Also, if you miss three or more appointments without notifying the office, you may be dismissed from the clinic at the provider's discretion.      For prescription refill requests, have your pharmacy contact our office and allow 72 hours for refills to be completed.    Today you received the following chemotherapy and/or immunotherapy agents: Enhertu      To help prevent nausea and vomiting after your treatment, we encourage you to take your nausea medication as directed.  BELOW ARE SYMPTOMS THAT SHOULD BE REPORTED IMMEDIATELY: *FEVER GREATER THAN 100.4 F (38 C) OR HIGHER *CHILLS OR SWEATING *NAUSEA AND VOMITING THAT IS NOT CONTROLLED WITH YOUR NAUSEA MEDICATION *UNUSUAL SHORTNESS OF BREATH *UNUSUAL BRUISING OR BLEEDING *URINARY PROBLEMS (pain or burning when urinating, or frequent urination) *BOWEL PROBLEMS (unusual diarrhea, constipation, pain near the anus) TENDERNESS IN MOUTH AND THROAT WITH OR WITHOUT PRESENCE OF ULCERS (sore throat, sores in mouth, or a toothache) UNUSUAL RASH, SWELLING OR PAIN  UNUSUAL VAGINAL DISCHARGE OR ITCHING   Items with * indicate a potential emergency and should be followed up as soon as possible or go to the Emergency Department if any problems should occur.  Please show the CHEMOTHERAPY ALERT CARD or IMMUNOTHERAPY ALERT CARD at  check-in to the Emergency Department and triage nurse.  Should you have questions after your visit or need to cancel or reschedule your appointment, please contact [facility]  Dept: [phone]  and follow the prompts.  Office hours are 8:00 a.m. to 4:30 p.m. Monday - Friday. Please note that voicemails left after 4:00 p.m. may not be returned until the following business day.  We are closed weekends and major holidays. You have access to a nurse at all times for urgent questions. Please call the main number to the clinic Dept: [phone] and follow the prompts.   For any non-urgent questions, you may also contact your provider using MyChart. We now offer e-Visits for anyone 18 and older to request care online for non-urgent symptoms. For details visit [email or web address].   Also download the MyChart app! Go to the app store, search "MyChart", open the app, select [facility], and log in with your MyChart username and password.  Hypokalemia Hypokalemia means that the amount of potassium in the blood is lower than normal. Potassium is a mineral (electrolyte) that helps regulate the amount of fluid in the body. It also stimulates muscle tightening (contraction) and helps nerves work properly. Normally, most of the body's potassium is inside cells, and only a very small amount is in the blood. Because the amount in the blood is so small, minor changes to potassium levels in the blood can be life-threatening. What are the causes? This condition may be caused by: Antibiotic medicine. Diarrhea or vomiting. Taking too much of a medicine that helps you have a   bowel movement (laxative) can cause diarrhea and lead to hypokalemia. Chronic kidney disease (CKD). Medicines that help the body get rid of excess fluid (diuretics). Eating disorders, such as anorexia or bulimia. Low magnesium levels in the body. Sweating a lot. What are the signs or symptoms? Symptoms  of this condition include: Weakness. Constipation. Fatigue. Muscle cramps. Mental confusion. Skipped heartbeats or irregular heartbeat (palpitations). Tingling or numbness. How is this diagnosed? This condition is diagnosed with a blood test. How is this treated? This condition may be treated by: Taking potassium supplements. Adjusting the medicines that you take. Eating more foods that contain a lot of potassium. If your potassium level is very low, you may need to get potassium through an IV and be monitored in the hospital. Follow these instructions at home: Eating and drinking  Eat a healthy diet. A healthy diet includes fresh fruits and vegetables, whole grains, healthy fats, and lean proteins. If told, eat more foods that contain a lot of potassium. These include: Nuts, such as peanuts and pistachios. Seeds, such as sunflower seeds and pumpkin seeds. Peas, lentils, and lima beans. Whole grain and bran cereals and breads. Fresh fruits and vegetables, such as apricots, avocado, bananas, cantaloupe, kiwi, oranges, tomatoes, asparagus, and potatoes. Juices, such as orange, tomato, and prune. Lean meats, including fish. Milk and milk products, such as yogurt. General instructions Take over-the-counter and prescription medicines only as told by your health care provider. This includes vitamins, natural food products, and supplements. Keep all follow-up visits. This is important. Contact a health care provider if: You have weakness that gets worse. You feel your heart pounding or racing. You vomit. You have diarrhea. You have diabetes and you have trouble keeping your blood sugar in your target range. Get help right away if: You have chest pain. You have shortness of breath. You have vomiting or diarrhea that lasts for more than 2 days. You faint. These symptoms may be an emergency. Get help right away. Call 911. Do not wait to see if the symptoms will go away. Do not  drive yourself to the hospital. Summary Hypokalemia means that the amount of potassium in the blood is lower than normal. This condition is diagnosed with a blood test. Hypokalemia may be treated by taking potassium supplements, adjusting the medicines that you take, or eating more foods that are high in potassium. If your potassium level is very low, you may need to get potassium through an IV and be monitored in the hospital. This information is not intended to replace advice given to you by your health care provider. Make sure you discuss any questions you have with your health care provider. Document Revised: 11/03/2020 Document Reviewed: 11/03/2020 Elsevier Patient Education  2023 [employer]  

## 2022-06-27 NOTE — Progress Notes (Signed)
Pt requesting updated letter be sent to her email requesting for work accommodations until October 03, 2022. Letter successfully sent to pt email address.

## 2022-06-28 ENCOUNTER — Other Ambulatory Visit: Payer: 59

## 2022-06-28 ENCOUNTER — Ambulatory Visit: Payer: 59

## 2022-06-28 ENCOUNTER — Ambulatory Visit: Payer: 59 | Admitting: Hematology and Oncology

## 2022-06-29 ENCOUNTER — Other Ambulatory Visit: Payer: Self-pay

## 2022-06-29 ENCOUNTER — Telehealth: Payer: Self-pay

## 2022-06-29 DIAGNOSIS — C50511 Malignant neoplasm of lower-outer quadrant of right female breast: Secondary | ICD-10-CM

## 2022-06-29 NOTE — Progress Notes (Signed)
Verbal order from Dr Pamelia Hoit given for 1L NS over 2 hr with K+ 10 meq over one hr. Orders entered under sign & hold for RN to release.

## 2022-06-29 NOTE — Telephone Encounter (Signed)
PA sent to plan. Waiting for determination.  

## 2022-06-30 ENCOUNTER — Inpatient Hospital Stay: Payer: 59

## 2022-06-30 VITALS — BP 119/67 | HR 75 | Temp 97.5°F | Resp 20

## 2022-06-30 DIAGNOSIS — Z170 Estrogen receptor positive status [ER+]: Secondary | ICD-10-CM

## 2022-06-30 DIAGNOSIS — C50919 Malignant neoplasm of unspecified site of unspecified female breast: Secondary | ICD-10-CM

## 2022-06-30 DIAGNOSIS — R110 Nausea: Secondary | ICD-10-CM

## 2022-06-30 DIAGNOSIS — Z95828 Presence of other vascular implants and grafts: Secondary | ICD-10-CM

## 2022-06-30 DIAGNOSIS — Z5112 Encounter for antineoplastic immunotherapy: Secondary | ICD-10-CM | POA: Diagnosis not present

## 2022-06-30 MED ORDER — HEPARIN SOD (PORK) LOCK FLUSH 100 UNIT/ML IV SOLN
500.0000 [IU] | Freq: Once | INTRAVENOUS | Status: AC
  Administered 2022-06-30: 500 [IU]

## 2022-06-30 MED ORDER — SODIUM CHLORIDE 0.9% FLUSH: 3.0000 mL | Freq: Once | INTRAVENOUS | Status: DC | PRN

## 2022-06-30 MED ORDER — ALTEPLASE 2 MG IJ SOLR: 2.0000 mg | Freq: Once | INTRAMUSCULAR | Status: DC | PRN

## 2022-06-30 MED ORDER — POTASSIUM CHLORIDE 10 MEQ/100ML IV SOLN
10.0000 meq | INTRAVENOUS | Status: AC
  Administered 2022-06-30: 10 meq via INTRAVENOUS

## 2022-06-30 MED ORDER — HEPARIN SOD (PORK) LOCK FLUSH 100 UNIT/ML IV SOLN: 250.0000 [IU] | Freq: Once | INTRAVENOUS | Status: DC | PRN

## 2022-06-30 MED ORDER — SODIUM CHLORIDE 0.9% FLUSH
10.0000 mL | Freq: Once | INTRAVENOUS | Status: AC
  Administered 2022-06-30: 10 mL

## 2022-06-30 MED ORDER — SODIUM CHLORIDE 0.9 % IV SOLN: Freq: Once | INTRAVENOUS | Status: AC

## 2022-06-30 MED ORDER — SODIUM CHLORIDE 0.9 % IV SOLN: INTRAVENOUS | Status: AC

## 2022-06-30 NOTE — Patient Instructions (Signed)
Hypokalemia Hypokalemia means that the amount of potassium in the blood is lower than normal. Potassium is a mineral (electrolyte) that helps regulate the amount of fluid in the body. It also stimulates muscle tightening (contraction) and helps nerves work properly. Normally, most of the body's potassium is inside cells, and only a very small amount is in the blood. Because the amount in the blood is so small, minor changes to potassium levels in the blood can be life-threatening. What are the causes? This condition may be caused by: Antibiotic medicine. Diarrhea or vomiting. Taking too much of a medicine that helps you have a bowel movement (laxative) can cause diarrhea and lead to hypokalemia. Chronic kidney disease (CKD). Medicines that help the body get rid of excess fluid (diuretics). Eating disorders, such as anorexia or bulimia. Low magnesium levels in the body. Sweating a lot. What are the signs or symptoms? Symptoms of this condition include: Weakness. Constipation. Fatigue. Muscle cramps. Mental confusion. Skipped heartbeats or irregular heartbeat (palpitations). Tingling or numbness. How is this diagnosed? This condition is diagnosed with a blood test. How is this treated? This condition may be treated by: Taking potassium supplements. Adjusting the medicines that you take. Eating more foods that contain a lot of potassium. If your potassium level is very low, you may need to get potassium through an IV and be monitored in the hospital. Follow these instructions at home: Eating and drinking  Eat a healthy diet. A healthy diet includes fresh fruits and vegetables, whole grains, healthy fats, and lean proteins. If told, eat more foods that contain a lot of potassium. These include: Nuts, such as peanuts and pistachios. Seeds, such as sunflower seeds and pumpkin seeds. Peas, lentils, and lima beans. Whole grain and bran cereals and breads. Fresh fruits and vegetables,  such as apricots, avocado, bananas, cantaloupe, kiwi, oranges, tomatoes, asparagus, and potatoes. Juices, such as orange, tomato, and prune. Lean meats, including fish. Milk and milk products, such as yogurt. General instructions Take over-the-counter and prescription medicines only as told by your health care provider. This includes vitamins, natural food products, and supplements. Keep all follow-up visits. This is important. Contact a health care provider if: You have weakness that gets worse. You feel your heart pounding or racing. You vomit. You have diarrhea. You have diabetes and you have trouble keeping your blood sugar in your target range. Get help right away if: You have chest pain. You have shortness of breath. You have vomiting or diarrhea that lasts for more than 2 days. You faint. These symptoms may be an emergency. Get help right away. Call 911. Do not wait to see if the symptoms will go away. Do not drive yourself to the hospital. Summary Hypokalemia means that the amount of potassium in the blood is lower than normal. This condition is diagnosed with a blood test. Hypokalemia may be treated by taking potassium supplements, adjusting the medicines that you take, or eating more foods that are high in potassium. If your potassium level is very low, you may need to get potassium through an IV and be monitored in the hospital. This information is not intended to replace advice given to you by your health care provider. Make sure you discuss any questions you have with your health care provider. Document Revised: 11/03/2020 Document Reviewed: 11/03/2020 Elsevier Patient Education  2023 Else[employer] Inc.  

## 2022-07-11 ENCOUNTER — Ambulatory Visit (INDEPENDENT_AMBULATORY_CARE_PROVIDER_SITE_OTHER): Payer: 59 | Admitting: Internal Medicine

## 2022-07-11 ENCOUNTER — Encounter: Payer: Self-pay | Admitting: Internal Medicine

## 2022-07-11 VITALS — BP 126/84 | HR 75 | Temp 98.2°F | Ht 69.0 in | Wt 301.4 lb

## 2022-07-11 DIAGNOSIS — I700 Atherosclerosis of aorta: Secondary | ICD-10-CM

## 2022-07-11 DIAGNOSIS — I10 Essential (primary) hypertension: Secondary | ICD-10-CM

## 2022-07-11 DIAGNOSIS — Z0000 Encounter for general adult medical examination without abnormal findings: Secondary | ICD-10-CM

## 2022-07-11 DIAGNOSIS — K5903 Drug induced constipation: Secondary | ICD-10-CM | POA: Diagnosis not present

## 2022-07-11 DIAGNOSIS — C50511 Malignant neoplasm of lower-outer quadrant of right female breast: Secondary | ICD-10-CM | POA: Diagnosis not present

## 2022-07-11 DIAGNOSIS — Z6841 Body Mass Index (BMI) 40.0 and over, adult: Secondary | ICD-10-CM

## 2022-07-11 DIAGNOSIS — Z170 Estrogen receptor positive status [ER+]: Secondary | ICD-10-CM

## 2022-07-11 DIAGNOSIS — E66813 Obesity, class 3: Secondary | ICD-10-CM

## 2022-07-11 LAB — POCT URINALYSIS DIPSTICK
Bilirubin, UA: NEGATIVE
Glucose, UA: NEGATIVE
Ketones, UA: NEGATIVE
Nitrite, UA: NEGATIVE
Protein, UA: POSITIVE — AB
Spec Grav, UA: 1.02 (ref 1.010–1.025)
Urobilinogen, UA: 1 E.U./dL
pH, UA: 6.5 (ref 5.0–8.0)

## 2022-07-11 MED ORDER — LINACLOTIDE 72 MCG PO CAPS: 72.0000 ug | ORAL_CAPSULE | Freq: Every day | ORAL | 0 refills | Status: DC

## 2022-07-11 NOTE — Patient Instructions (Signed)

## 2022-07-11 NOTE — Progress Notes (Signed)
I,Pamela [name],acting as a scribe for Pamela Aliment, MD.,have documented all relevant documentation on the behalf of Pamela Aliment, MD,as directed by  Pamela Aliment, MD while in the presence of Pamela Aliment, MD.  Subjective:     Patient ID: Pamela [name] , female    DOB: 1965-06-20 , 57 y.o.   MRN: [id number]   Chief Complaint  Patient presents with   Annual Exam   Hypertension   Hyperlipidemia    HPI  She is here today for a full physical exam. She is followed by [facility]  for her pelvic exams. She would like to decline her EKG due to seeing a cardiologist regularly. Last EKG performed on 03/28/2022. She reports compliance with meds.  Letter sent to gyn for pap result.    Hypertension This is a chronic problem. The current episode started more than 1 year ago. The problem has been gradually improving since onset. The problem is controlled. Pertinent negatives include no blurred vision, chest pain, palpitations or shortness of breath. Risk factors for coronary artery disease include obesity and sedentary lifestyle.     Past Medical History:  Diagnosis Date   Anxiety    Asthma    Back pain    Breast cancer (HCC)    Constipation    DUB (dysfunctional uterine bleeding) 2007   Edema of both lower extremities    Elevated cholesterol    Family history of breast cancer 05/12/2020   Family history of lung cancer 05/12/2020   Food allergy    H/O menorrhagia 05/01/2006   High cholesterol    History of ovarian cyst 2007   Hypertension 03/31/2005   Increased BMI 07/11/2006   Joint pain    Migraine    N&V (nausea and vomiting)    Obesity 2007   Obstructive sleep apnea syndrome, moderate 10/27/2013   no cpap   Personal history of chemotherapy    Personal history of radiation therapy    Sleep apnea    SOB (shortness of breath)    Vitamin D deficiency    Vitamin D deficiency    Weight loss 07/11/2006     Family History  Problem Relation Age  of Onset   Hypertension Mother    High Cholesterol Mother    Thyroid disease Mother    Cancer Mother    Sleep apnea Mother    Hypertension Father    Heart attack Father    Sudden death Father    Diabetes Maternal Grandmother    Bone cancer Maternal Grandfather        dx after 66   Breast cancer Maternal Aunt        dx 30s   Lung cancer Maternal Aunt        dx after 50     Current Outpatient Medications:    amLODipine (NORVASC) 10 MG tablet, Take 1 tablet (10 mg total) by mouth daily., Disp: 90 tablet, Rfl: 2   aspirin EC 81 MG tablet, Take 1 tablet (81 mg total) by mouth daily. Swallow whole., Disp: 30 tablet, Rfl: 11   cetirizine (ZYRTEC ALLERGY) 10 MG tablet, Take 1 tablet (10 mg total) by mouth daily., Disp: 30 tablet, Rfl: 2   EDARBYCLOR 40-25 MG TABS, Take 1 tablet by mouth daily., Disp: 90 tablet, Rfl: 2   HYDROcodone bit-homatropine (HYDROMET) 5-1.5 MG/5ML syrup, Take 5 mLs by mouth every 6 (six) hours as needed., Disp: 120 mL, Rfl: 0   levonorgestrel (MIRENA, 52  MG,) 20 MCG/DAY IUD, 1 each by Intrauterine route once., Disp: , Rfl:    lidocaine-prilocaine (EMLA) cream, Apply to affected area once AS DIRECTED, Disp: 30 g, Rfl: 3   linaclotide (LINZESS) 72 MCG capsule, Take 1 capsule (72 mcg total) by mouth daily before breakfast., Disp: 30 capsule, Rfl: 0   potassium chloride SA (KLOR-CON M) 20 MEQ tablet, Take 1 tablet (20 mEq total) by mouth daily., Disp: 30 tablet, Rfl: 0   prochlorperazine (COMPAZINE) 10 MG tablet, Take 1 tablet (10 mg total) by mouth every 6 (six) hours as needed (Nausea or vomiting)., Disp: 30 tablet, Rfl: 1   rosuvastatin (CRESTOR) 40 MG tablet, Take 1 tablet (40 mg total) by mouth daily., Disp: 90 tablet, Rfl: 3   venlafaxine XR (EFFEXOR-XR) 37.5 MG 24 hr capsule, Take 1 capsule (37.5 mg total) by mouth daily with breakfast., Disp: 30 capsule, Rfl: 6   LORazepam (ATIVAN) 0.5 MG tablet, Take 1 tablet (0.5 mg total) by mouth every 8 (eight) hours as needed  for anxiety., Disp: 30 tablet, Rfl: 0 No current facility-administered medications for this visit.  Facility-Administered Medications Ordered in Other Visits:    heparin lock flush 100 unit/mL, 500 Units, Intracatheter, Once, Gudena, Vinay, MD   sodium chloride flush (NS) 0.9 % injection 10 mL, 10 mL, Intracatheter, Once, Serena Croissant, MD   Allergies  Allergen Reactions   Pollen Extract Shortness Of Breath   Compazine [Prochlorperazine] Anxiety    IV compazine causes severe anxiety attack   Latex Rash   Codeine Hives   Diflucan [Fluconazole] Hives   Shellfish Allergy Hives   Betadine [Povidone Iodine] Rash      The patient states she uses IUD for birth control. Last LMP was No LMP recorded. (Menstrual status: IUD).. Negative for Dysmenorrhea. Negative for: breast discharge, breast lump(s), breast pain and breast self exam. Associated symptoms include abnormal vaginal bleeding. Pertinent negatives include abnormal bleeding (hematology), anxiety, decreased libido, depression, difficulty falling sleep, dyspareunia, history of infertility, nocturia, sexual dysfunction, sleep disturbances, urinary incontinence, urinary urgency, vaginal discharge and vaginal itching. Diet regular.The patient states her exercise level is  intermittent.  . The patient's tobacco use is:  Social History   Tobacco Use  Smoking Status Never  Smokeless Tobacco Never  . She has been exposed to passive smoke. The patient's alcohol use is:  Social History   Substance and Sexual Activity  Alcohol Use No   Review of Systems  Constitutional: Negative.   HENT: Negative.    Eyes: Negative.  Negative for blurred vision.  Respiratory: Negative.  Negative for shortness of breath.   Cardiovascular: Negative.  Negative for chest pain and palpitations.  Gastrointestinal:  Positive for constipation.  Endocrine: Negative.   Genitourinary: Negative.   Musculoskeletal: Negative.   Skin: Negative.    Allergic/Immunologic: Negative.   Neurological: Negative.   Hematological: Negative.   Psychiatric/Behavioral: Negative.       Today's Vitals   07/11/22 0850  BP: 126/84  Pulse: 75  Temp: 98.2 F (36.8 C)  SpO2: 98%  Weight: (!) 301 lb 6.4 oz (136.7 kg)  Height: 5\' 9"  (1.753 m)   Body mass index is 44.51 kg/m.  Wt Readings from Last 3 Encounters:  07/11/22 (!) 301 lb 6.4 oz (136.7 kg)  06/27/22 (!) 302 lb 1.6 oz (137 kg)  06/07/22 (!) 307 lb 1.6 oz (139.3 kg)    Objective:  Physical Exam Vitals and nursing note reviewed.  Constitutional:      Appearance: Normal  appearance. She is obese.  HENT:     Head: Normocephalic and atraumatic.     Right Ear: Tympanic membrane, ear canal and external ear normal.     Left Ear: Tympanic membrane, ear canal and external ear normal.     Nose: Nose normal.     Mouth/Throat:     Mouth: Mucous membranes are moist.     Pharynx: Oropharynx is clear.  Eyes:     Extraocular Movements: Extraocular movements intact.     Conjunctiva/sclera: Conjunctivae normal.     Pupils: Pupils are equal, round, and reactive to light.  Cardiovascular:     Rate and Rhythm: Normal rate and regular rhythm.     Pulses: Normal pulses.     Heart sounds: Normal heart sounds.  Pulmonary:     Effort: Pulmonary effort is normal.     Breath sounds: Normal breath sounds.  Chest:  Breasts:    Right: Mass present.     Left: Normal.     Comments: Healed surgical scars b/l Abdominal:     General: Bowel sounds are normal.     Palpations: Abdomen is soft.     Comments: Obese, soft  Genitourinary:    Comments: deferred Musculoskeletal:        General: Normal range of motion.     Cervical back: Normal range of motion and neck supple.  Skin:    General: Skin is warm and dry.  Neurological:     General: No focal deficit present.     Mental Status: She is alert and oriented to person, place, and time.  Psychiatric:        Mood and Affect: Mood normal.         Behavior: Behavior normal.         Assessment And Plan:     1. Encounter for annual health examination Comments: A full exam was performed.  Importance of monthly self breast exams was discussed with the patient.  PATIENT IS ADVISED TO GET 30-45 MINUTES REGULAR EXERCISE NO LESS THAN FOUR TO FIVE DAYS PER WEEK - BOTH WEIGHTBEARING EXERCISES AND AEROBIC ARE RECOMMENDED.  PATIENT IS ADVISED TO FOLLOW A HEALTHY DIET WITH AT LEAST SIX FRUITS/VEGGIES PER DAY, DECREASE INTAKE OF RED MEAT, AND TO INCREASE FISH INTAKE TO TWO DAYS PER WEEK.  MEATS/FISH SHOULD NOT BE FRIED, BAKED OR BROILED IS PREFERABLE.  IT IS ALSO IMPORTANT TO CUT BACK ON YOUR SUGAR INTAKE. PLEASE AVOID ANYTHING WITH ADDED SUGAR, CORN SYRUP OR OTHER SWEETENERS. IF YOU MUST USE A SWEETENER, YOU CAN TRY STEVIA. IT IS ALSO IMPORTANT TO AVOID ARTIFICIALLY SWEETENERS AND DIET BEVERAGES. LASTLY, I SUGGEST WEARING SPF 50 SUNSCREEN ON EXPOSED PARTS AND ESPECIALLY WHEN IN THE DIRECT SUNLIGHT FOR AN EXTENDED PERIOD OF TIME.  PLEASE AVOID FAST FOOD RESTAURANTS AND INCREASE YOUR WATER INTAKE.  2. Essential hypertension Comments: Chronic , fair control. Goal BP< 120/80. Most recent EKG reviewed.  She wil c/w Edarbyclor and amlodipine daily, follow low sodium diet. F/u 6 months. - POCT Urinalysis Dipstick (81002) - Microalbumin / creatinine urine ratio  3. Aortic atherosclerosis (HCC) Comments: Chronic, LDL goal < 70.  She is encouraged to comply with rosuvastatin daily.  4. Malignant neoplasm of lower-outer quadrant of right breast of female, estrogen receptor positive (HCC) Comments: Most recent Oncology notes reviewed. She is s/p lumpectomy, XRT and currently on Chemo.  Currently on Enhertu.  5. Drug-induced constipation Comments: She states nausea medication given w/ chemo makes her constipated. She was given samples  of Linzess to use week prior, she will let me know if this is effective. If so, I will send rx to start PA process.   6. Class 3  severe obesity due to excess calories with serious comorbidity and body mass index (BMI) of 40.0 to 44.9 in adult (HCC) BMI 44. She is encouraged to initially strive for BMI less than 35 to decrease cardiac risk. Advised to aim for at least 150 minutes of exercise per week.   Patient was given opportunity to ask questions. Patient verbalized understanding of the plan and was able to repeat key elements of the plan. All questions were answered to their satisfaction.   I, Pamela Aliment, MD, have reviewed all documentation for this visit. The documentation on 07/11/22 for the exam, diagnosis, procedures, and orders are all accurate and complete.   THE PATIENT IS ENCOURAGED TO PRACTICE SOCIAL DISTANCING DUE TO THE COVID-19 PANDEMIC.

## 2022-07-12 ENCOUNTER — Other Ambulatory Visit: Payer: Self-pay

## 2022-07-14 LAB — MICROALBUMIN / CREATININE URINE RATIO
Creatinine, Urine: 194 mg/dL
Microalb/Creat Ratio: 24 mg/g creat (ref 0–29)
Microalbumin, Urine: 47 ug/mL

## 2022-07-18 NOTE — Progress Notes (Signed)
Patient Care Team: Dorothyann Peng, MD as PCP - General (Internal Medicine) Kathryne Hitch, MD as Consulting Physician (Orthopedic Surgery) Pershing Proud, RN as Oncology Nurse Navigator Donnelly Angelica, RN as Oncology Nurse Navigator Harriette Bouillon, MD as Consulting Physician (General Surgery) Serena Croissant, MD as Consulting Physician (Hematology and Oncology) Lonie Peak, MD as Attending Physician (Radiation Oncology)  DIAGNOSIS:  Encounter Diagnosis  Name Primary?   Malignant neoplasm of lower-outer quadrant of right breast of female, estrogen receptor positive (HCC) Yes    SUMMARY OF ONCOLOGIC HISTORY: Oncology History  Malignant neoplasm of lower-outer quadrant of right breast of female, estrogen receptor positive (HCC)  05/03/2020 Initial Diagnosis   Screening mammogram showed a 1.0cm mass at the 6 o'clock position in the right breast. Diagnostic mammogram and US showed the 1.2cm mass at the 6 o'clock position in the right breast. Biopsy showed IDC, grade 3, HER-2 positive (3+), ER 15% weak, PR-, Ki67 80%.   05/19/2020 Genetic Testing   Negative hereditary cancer genetic testing: no pathogenic variants detected in [facility] Breast STAT Panel and Common Hereditary Cancers Panel.  The report dates are May 19, 2020 (STAT) and May 23, 2020 (Common Hereditary).   The Common Hereditary Cancers Panel offered by [facility] includes sequencing and/or deletion duplication testing of the following 47 genes: APC, ATM, AXIN2, BARD1, BMPR1A, BRCA1, BRCA2, BRIP1, CDH1, CDK4, CDKN2A (p14ARF), CDKN2A (p16INK4a), CHEK2, CTNNA1, DICER1, EPCAM (Deletion/duplication testing only), GREM1 (promoter region deletion/duplication testing only), GREM1, HOXB13, KIT, MEN1, MLH1, MSH2, MSH3, MSH6, MUTYH, NBN, NF1, NHTL1, PALB2, PDGFRA, PMS2, POLD1, POLE, PTEN, RAD50, RAD51C, RAD51D, SDHA, SDHB, SDHC, SDHD, SMAD4, SMARCA4. STK11, TP53, TSC1, TSC2, and VHL.  The following genes were evaluated for sequence  changes only: SDHA and HOXB13 c.251G>A variant only.es only: SDHA and HOXB13 c.251G>A variant only.   06/02/2020 Surgery   Right lumpectomy ([facility]): invasive and in situ ductal carcinoma, 1.3cm, clear margins, 1 right axillary lymph node negative for carcinoma.   06/02/2020 Cancer Staging   Staging form: Breast, AJCC 8th Edition - Pathologic stage from 06/02/2020: Stage IA (pT1c, pN0, cM0, G3, ER+, PR-, HER2+) - Signed by Loa Socks, NP on 06/15/2020 Stage prefix: Initial diagnosis Histologic grading system: 3 grade system   07/08/2020 - 10/21/2020 Chemotherapy   Taxol Herceptin followed by Herceptin maintenance   04/14/2021 -  Anti-estrogen oral therapy   Tamoxifen 10 mg   07/13/2021 Imaging   CT angiogram: Right supraclavicular lymph node 5 cm, right internal mammary lymph nodes 1.8 cm    08/06/2021 PET scan   PET CT scan: Ext Right Supra clav LN along with Internal mammary and Rt Upper Mediastinal and Rt Axillary LN   08/2021 Initial Biopsy   Right supraclavicular lymphadenopathy: Biopsy: Metastatic poorly differentiated adenocarcinoma, ER +40%, PR negative, HER2 positive 3+    08/21/2021 -  Chemotherapy   Enhertu every 3 weeks    03/08/2022 Imaging   CT chest/abdomen/pelvis  1. No new or progressive findings in the chest, abdomen or pelvis. 2. Signs of RIGHT breast lumpectomy with areas of central fat necrosis unchanged from previous imaging. 3. Stable small lymph nodes throughout the chest, largest with area of soft tissue about surgical clips in the RIGHT axilla. 4. Stable appearance of thoracic inlet lymph nodes, not shown to be hypermetabolic on prior PET imaging. 5. IUD in-situ. 6. Aortic atherosclerosis.     CHIEF COMPLIANT:  Follow-up metastatic breast cancer  cycle 16 Enhertu   INTERVAL HISTORY: Pamela [name] is a 57  y.o. with the above-mentioned metastatic breast cancer currently on treatment with Enhertu. She presents to the clinic for a follow-up.  She reports that everything is going well. She denies any symptoms or side effects. The fatigue is getting better with sleep. She says she has been having some swelling in the right hand and she notice that her ankles has been swelling more.   ALLERGIES:  is allergic to pollen extract, compazine [prochlorperazine], latex, codeine, diflucan [fluconazole], shellfish allergy, and betadine [povidone iodine].  MEDICATIONS:  Current Outpatient Medications  Medication Sig Dispense Refill   amLODipine (NORVASC) 10 MG tablet Take 1 tablet (10 mg total) by mouth daily. 90 tablet 2   aspirin EC 81 MG tablet Take 1 tablet (81 mg total) by mouth daily. Swallow whole. 30 tablet 11   cetirizine (ZYRTEC ALLERGY) 10 MG tablet Take 1 tablet (10 mg total) by mouth daily. 30 tablet 2   EDARBYCLOR 40-25 MG TABS Take 1 tablet by mouth daily. 90 tablet 2   HYDROcodone bit-homatropine (HYDROMET) 5-1.5 MG/5ML syrup Take 5 mLs by mouth every 6 (six) hours as needed. 120 mL 0   levonorgestrel (MIRENA, 52 MG,) 20 MCG/DAY IUD 1 each by Intrauterine route once.     lidocaine-prilocaine (EMLA) cream Apply to affected area once AS DIRECTED 30 g 3   linaclotide (LINZESS) 72 MCG capsule Take 1 capsule (72 mcg total) by mouth daily before breakfast. 30 capsule 0   potassium chloride SA (KLOR-CON M) 20 MEQ tablet Take 1 tablet (20 mEq total) by mouth daily. 30 tablet 0   prochlorperazine (COMPAZINE) 10 MG tablet Take 1 tablet (10 mg total) by mouth every 6 (six) hours as needed (Nausea or vomiting). 30 tablet 1   rosuvastatin (CRESTOR) 40 MG tablet Take 1 tablet (40 mg total) by mouth daily. 90 tablet 3   venlafaxine XR (EFFEXOR-XR) 37.5 MG 24 hr capsule Take 1 capsule (37.5 mg total) by mouth daily with breakfast. 30 capsule 6   No current facility-administered medications for this visit.   Facility-Administered Medications Ordered in Other Visits  Medication Dose Route Frequency Provider Last Rate Last Admin   heparin lock  flush 100 unit/mL  500 Units Intracatheter Once Serena Croissant, MD       sodium chloride flush (NS) 0.9 % injection 10 mL  10 mL Intracatheter Once Serena Croissant, MD        PHYSICAL EXAMINATION: ECOG PERFORMANCE STATUS: 1 - Symptomatic but completely ambulatory  Vitals:   07/20/22 0917  BP: 127/66  Pulse: 70  Resp: 16  Temp: 98.6 F (37 C)  SpO2: 100%   Filed Weights   07/20/22 0917  Weight: (!) 303 lb 9.6 oz (137.7 kg)      LABORATORY DATA:  I have reviewed the data as listed    Latest Ref Rng & Units 06/27/2022   10:39 AM 06/07/2022    8:52 AM 05/17/2022    9:13 AM  CMP  Glucose 70 - 99 mg/dL 99  161  95   BUN 6 - 20 mg/dL 7  9  8    Creatinine 0.44 - 1.00 mg/dL 0.96  0.45  4.09   Sodium 135 - 145 mmol/L 142  141  142   Potassium 3.5 - 5.1 mmol/L 3.3  3.1  3.3   Chloride 98 - 111 mmol/L 107  106  106   CO2 22 - 32 mmol/L 30  29  30    Calcium 8.9 - 10.3 mg/dL 9.5  9.2  9.0  Total Protein 6.5 - 8.1 g/dL 6.6  6.3  6.5   Total Bilirubin 0.3 - 1.2 mg/dL 1.5  1.3  1.5   Alkaline Phos 38 - 126 U/L 81  86  87   AST 15 - 41 U/L 31  25  23    ALT 0 - 44 U/L 16  12  12      Lab Results  Component Value Date   WBC 2.7 (L) 07/20/2022   HGB 11.0 (L) 07/20/2022   HCT 33.3 (L) 07/20/2022   MCV 89.0 07/20/2022   PLT 196 07/20/2022   NEUTROABS 1.1 (L) 07/20/2022    ASSESSMENT & PLAN:  Malignant neoplasm of lower-outer quadrant of right breast of female, estrogen receptor positive (HCC) 06/02/2020:Right lumpectomy ([facility]): invasive and in situ ductal carcinoma, 1.3cm, clear margins, 1 right axillary lymph node negative for carcinoma. ER 15% weak, PR-,HER-2 positive (3+) Ki67 80%.   Treatment plan: 1.  Adjuvant chemotherapy with Taxol Herceptin followed by Herceptin maintenance.  Stopped after 11 cycles of Taxol Herceptin maintenance completed 07/07/2021  2.  Adjuvant radiation therapy 11/23/2020-12/19/2020 3.  Adjuvant antiestrogen therapy with tamoxifen started October 2022 4.   CT angiogram 07/13/2021: Right supraclavicular lymph node 5 cm, right internal mammary lymph nodes 1.8 cm 5.  Metastatic breast cancer: June 2023:Right supraclavicular lymphadenopathy: Biopsy: Metastatic poorly differentiated adenocarcinoma, ER +40%, PR negative, HER2 positive 3+ PET CT scan 08/06/21: Ext Right Supra clav LN along with Internal mammary and Rt Upper Mediastinal and Rt Axillary LN -----------------------------------------------------------------------------------------------------  Current treatment: Enhertu started on 08/21/2021, (this treatment on hold) today is cycle 16   Enhertu toxicities: 1,.  Severe nausea, requiring IV fluids: Better since we added Emend 2. Chemotherapy-induced anemia: Monitoring closely, hemoglobin 11.1 3.  Hospitalization 11/26/2021-11/28/2021: Dizziness and fatigue, esophageal dysmotility 4.  Hypokalemia and diarrhea: Gets IV fluids on Saturdays 5.  Leukopenia: ANC 1.1: Okay to treat.  Will reduced the dosage further.  Echocardiogram has been scheduled for June.  Okay to treat with echocardiogram done in January.   CT chest abdomen pelvis 06/25/2022: Stable exam.  No new or progressive findings. Next CT scans will be done at the end of July or first week of August.  Return to clinic in 3 weeks and we may consider Neupogen injections if she is persistently has neutropenia.    Orders Placed This Encounter  Procedures   CBC with Differential (Cancer Center Only)    Standing Status:   Future    Standing Expiration Date:   08/31/2023   CMP (Cancer Center only)    Standing Status:   Future    Standing Expiration Date:   08/31/2023   CBC with Differential (Cancer Center Only)    Standing Status:   Future    Standing Expiration Date:   08/10/2023   CMP (Cancer Center only)    Standing Status:   Future    Standing Expiration Date:   08/10/2023   CBC with Differential (Cancer Center Only)    Standing Status:   Future    Standing Expiration Date:   09/21/2023    CMP (Cancer Center only)    Standing Status:   Future    Standing Expiration Date:   09/21/2023   CBC with Differential (Cancer Center Only)    Standing Status:   Future    Standing Expiration Date:   10/12/2023   CMP (Cancer Center only)    Standing Status:   Future    Standing Expiration Date:   10/12/2023  CBC with Differential (Cancer Center Only)    Standing Status:   Future    Standing Expiration Date:   11/02/2023   CMP (Cancer Center only)    Standing Status:   Future    Standing Expiration Date:   11/02/2023   CBC with Differential (Cancer Center Only)    Standing Status:   Future    Standing Expiration Date:   11/23/2023   CMP (Cancer Center only)    Standing Status:   Future    Standing Expiration Date:   11/23/2023   The patient has a good understanding of the overall plan. she agrees with it. she will call with any problems that may develop before the next visit here. Total time spent: 30 mins including face to face time and time spent for planning, charting and co-ordination of care   Tamsen Meek, MD 07/20/22    I Janan Ridge am acting as a Neurosurgeon for [employer]  I have reviewed the above documentation for accuracy and completeness, and I agree with the above.

## 2022-07-19 MED FILL — Fosaprepitant Dimeglumine For IV Infusion 150 MG (Base Eq): INTRAVENOUS | Qty: 5 | Status: AC

## 2022-07-20 ENCOUNTER — Other Ambulatory Visit: Payer: Self-pay

## 2022-07-20 ENCOUNTER — Inpatient Hospital Stay (HOSPITAL_BASED_OUTPATIENT_CLINIC_OR_DEPARTMENT_OTHER): Payer: 59 | Admitting: Hematology and Oncology

## 2022-07-20 ENCOUNTER — Inpatient Hospital Stay: Payer: 59

## 2022-07-20 ENCOUNTER — Inpatient Hospital Stay: Payer: 59 | Attending: Hematology and Oncology

## 2022-07-20 VITALS — BP 127/66 | HR 70 | Temp 98.6°F | Resp 16 | Wt 303.6 lb

## 2022-07-20 VITALS — BP 152/70 | HR 70 | Resp 16

## 2022-07-20 DIAGNOSIS — Z883 Allergy status to other anti-infective agents status: Secondary | ICD-10-CM | POA: Insufficient documentation

## 2022-07-20 DIAGNOSIS — Z79899 Other long term (current) drug therapy: Secondary | ICD-10-CM | POA: Diagnosis not present

## 2022-07-20 DIAGNOSIS — Z95828 Presence of other vascular implants and grafts: Secondary | ICD-10-CM

## 2022-07-20 DIAGNOSIS — R110 Nausea: Secondary | ICD-10-CM

## 2022-07-20 DIAGNOSIS — Z5112 Encounter for antineoplastic immunotherapy: Secondary | ICD-10-CM | POA: Insufficient documentation

## 2022-07-20 DIAGNOSIS — Z170 Estrogen receptor positive status [ER+]: Secondary | ICD-10-CM

## 2022-07-20 DIAGNOSIS — Z888 Allergy status to other drugs, medicaments and biological substances status: Secondary | ICD-10-CM | POA: Insufficient documentation

## 2022-07-20 DIAGNOSIS — C50511 Malignant neoplasm of lower-outer quadrant of right female breast: Secondary | ICD-10-CM

## 2022-07-20 DIAGNOSIS — C50919 Malignant neoplasm of unspecified site of unspecified female breast: Secondary | ICD-10-CM

## 2022-07-20 DIAGNOSIS — I700 Atherosclerosis of aorta: Secondary | ICD-10-CM | POA: Diagnosis not present

## 2022-07-20 DIAGNOSIS — Z885 Allergy status to narcotic agent status: Secondary | ICD-10-CM | POA: Insufficient documentation

## 2022-07-20 LAB — CMP (CANCER CENTER ONLY)
ALT: 13 U/L (ref 0–44)
AST: 29 U/L (ref 15–41)
Albumin: 3.4 g/dL — ABNORMAL LOW (ref 3.5–5.0)
Alkaline Phosphatase: 93 U/L (ref 38–126)
Anion gap: 5 (ref 5–15)
BUN: 6 mg/dL (ref 6–20)
CO2: 30 mmol/L (ref 22–32)
Calcium: 8.8 mg/dL — ABNORMAL LOW (ref 8.9–10.3)
Chloride: 107 mmol/L (ref 98–111)
Creatinine: 0.71 mg/dL (ref 0.44–1.00)
GFR, Estimated: >60 mL/min (ref >60)
Glucose, Bld: 96 mg/dL (ref 70–99)
Potassium: 3.2 mmol/L — ABNORMAL LOW (ref 3.5–5.1)
Sodium: 142 mmol/L (ref 135–145)
Total Bilirubin: 1.6 mg/dL — ABNORMAL HIGH (ref 0.3–1.2)
Total Protein: 6.3 g/dL — ABNORMAL LOW (ref 6.5–8.1)

## 2022-07-20 LAB — CBC WITH DIFFERENTIAL (CANCER CENTER ONLY)
Abs Immature Granulocytes: 0.01 10*3/uL (ref 0.00–0.07)
Basophils Absolute: 0 10*3/uL (ref 0.0–0.1)
Basophils Relative: 1 %
Eosinophils Absolute: 0.2 10*3/uL (ref 0.0–0.5)
Eosinophils Relative: 6 %
HCT: 33.3 % — ABNORMAL LOW (ref 36.0–46.0)
Hemoglobin: 11 g/dL — ABNORMAL LOW (ref 12.0–15.0)
Immature Granulocytes: 0 %
Lymphocytes Relative: 33 %
Lymphs Abs: 0.9 10*3/uL (ref 0.7–4.0)
MCH: 29.4 pg (ref 26.0–34.0)
MCHC: 33 g/dL (ref 30.0–36.0)
MCV: 89 fL (ref 80.0–100.0)
Monocytes Absolute: 0.5 10*3/uL (ref 0.1–1.0)
Monocytes Relative: 19 %
Neutro Abs: 1.1 10*3/uL — ABNORMAL LOW (ref 1.7–7.7)
Neutrophils Relative %: 41 %
Platelet Count: 196 10*3/uL (ref 150–400)
RBC: 3.74 MIL/uL — ABNORMAL LOW (ref 3.87–5.11)
RDW: 16.8 % — ABNORMAL HIGH (ref 11.5–15.5)
WBC Count: 2.7 10*3/uL — ABNORMAL LOW (ref 4.0–10.5)
nRBC: 0 % (ref 0.0–0.2)

## 2022-07-20 MED ORDER — FAM-TRASTUZUMAB DERUXTECAN-NXKI CHEMO 100 MG IV SOLR
300.0000 mg | Freq: Once | INTRAVENOUS | Status: AC
  Administered 2022-07-20: 300 mg via INTRAVENOUS
  Filled 2022-07-20: qty 15

## 2022-07-20 MED ORDER — PALONOSETRON HCL INJECTION 0.25 MG/5ML
0.2500 mg | Freq: Once | INTRAVENOUS | Status: AC
  Administered 2022-07-20: 0.25 mg via INTRAVENOUS
  Filled 2022-07-20: qty 5

## 2022-07-20 MED ORDER — POTASSIUM CHLORIDE 10 MEQ/100ML IV SOLN
10.0000 meq | Freq: Once | INTRAVENOUS | Status: AC
  Administered 2022-07-20: 10 meq via INTRAVENOUS
  Filled 2022-07-20: qty 100

## 2022-07-20 MED ORDER — HEPARIN SOD (PORK) LOCK FLUSH 100 UNIT/ML IV SOLN
500.0000 [IU] | Freq: Once | INTRAVENOUS | Status: AC | PRN
  Administered 2022-07-20: 500 [IU]

## 2022-07-20 MED ORDER — SODIUM CHLORIDE 0.9% FLUSH
10.0000 mL | Freq: Once | INTRAVENOUS | Status: AC
  Administered 2022-07-20: 10 mL

## 2022-07-20 MED ORDER — SODIUM CHLORIDE 0.9 % IV SOLN
150.0000 mg | Freq: Once | INTRAVENOUS | Status: AC
  Administered 2022-07-20: 150 mg via INTRAVENOUS
  Filled 2022-07-20: qty 150

## 2022-07-20 MED ORDER — ACETAMINOPHEN 325 MG PO TABS
650.0000 mg | ORAL_TABLET | Freq: Once | ORAL | Status: AC
  Administered 2022-07-20: 650 mg via ORAL
  Filled 2022-07-20: qty 2

## 2022-07-20 MED ORDER — CYANOCOBALAMIN 1000 MCG/ML IJ SOLN
1000.0000 ug | Freq: Once | INTRAMUSCULAR | Status: AC
  Administered 2022-07-20: 1000 ug via INTRAMUSCULAR
  Filled 2022-07-20: qty 1

## 2022-07-20 MED ORDER — SODIUM CHLORIDE 0.9 % IV SOLN: Freq: Once | INTRAVENOUS | Status: AC

## 2022-07-20 MED ORDER — SODIUM CHLORIDE 0.9% FLUSH
10.0000 mL | INTRAVENOUS | Status: DC | PRN
  Administered 2022-07-20: 10 mL

## 2022-07-20 MED ORDER — DEXTROSE 5 % IV SOLN: Freq: Once | INTRAVENOUS | Status: AC

## 2022-07-20 NOTE — Assessment & Plan Note (Addendum)
06/02/2020:Right lumpectomy ([facility]): invasive and in situ ductal carcinoma, 1.3cm, clear margins, 1 right axillary lymph node negative for carcinoma. ER 15% weak, PR-,HER-2 positive (3+) Ki67 80%.   Treatment plan: 1.  Adjuvant chemotherapy with Taxol Herceptin followed by Herceptin maintenance.  Stopped after 11 cycles of Taxol Herceptin maintenance completed 07/07/2021  2.  Adjuvant radiation therapy 11/23/2020-12/19/2020 3.  Adjuvant antiestrogen therapy with tamoxifen started October 2022 4.  CT angiogram 07/13/2021: Right supraclavicular lymph node 5 cm, right internal mammary lymph nodes 1.8 cm 5.  Metastatic breast cancer: June 2023:Right supraclavicular lymphadenopathy: Biopsy: Metastatic poorly differentiated adenocarcinoma, ER +40%, PR negative, HER2 positive 3+ PET CT scan 08/06/21: Ext Right Supra clav LN along with Internal mammary and Rt Upper Mediastinal and Rt Axillary LN -----------------------------------------------------------------------------------------------------  Current treatment: Enhertu started on 08/21/2021, (this treatment on hold) today is cycle 16   Enhertu toxicities: 1,.  Severe nausea, requiring IV fluids: Better since we added Emend 2. Chemotherapy-induced anemia: Monitoring closely, hemoglobin 11.1 3.  Hospitalization 11/26/2021-11/28/2021: Dizziness and fatigue, esophageal dysmotility 4.  Hypokalemia and diarrhea: Gets IV fluids on Saturdays 5.  Leukopenia: ANC 1.1: Okay to treat.  Will reduced the dosage further.  Echocardiogram has been scheduled for June.  Okay to treat with echocardiogram done in January.   CT chest abdomen pelvis 06/25/2022: Stable exam.  No new or progressive findings. Next CT scans will be done at the end of July or first week of August.  Return to clinic in 3 weeks and we may consider Neupogen injections if she is persistently has neutropenia.

## 2022-07-21 ENCOUNTER — Inpatient Hospital Stay: Payer: 59

## 2022-07-21 VITALS — BP 163/77 | HR 63 | Temp 97.5°F | Resp 18

## 2022-07-21 DIAGNOSIS — Z170 Estrogen receptor positive status [ER+]: Secondary | ICD-10-CM

## 2022-07-21 DIAGNOSIS — C50919 Malignant neoplasm of unspecified site of unspecified female breast: Secondary | ICD-10-CM

## 2022-07-21 DIAGNOSIS — Z95828 Presence of other vascular implants and grafts: Secondary | ICD-10-CM

## 2022-07-21 DIAGNOSIS — Z5112 Encounter for antineoplastic immunotherapy: Secondary | ICD-10-CM | POA: Diagnosis not present

## 2022-07-21 DIAGNOSIS — R110 Nausea: Secondary | ICD-10-CM

## 2022-07-21 MED ORDER — POTASSIUM CHLORIDE 10 MEQ/100ML IV SOLN
10.0000 meq | INTRAVENOUS | Status: AC
  Administered 2022-07-21 (×2): 10 meq via INTRAVENOUS
  Filled 2022-07-21 (×2): qty 100

## 2022-07-21 MED ORDER — HEPARIN SOD (PORK) LOCK FLUSH 100 UNIT/ML IV SOLN
500.0000 [IU] | Freq: Once | INTRAVENOUS | Status: AC
  Administered 2022-07-21: 500 [IU]

## 2022-07-21 MED ORDER — SODIUM CHLORIDE 0.9% FLUSH
10.0000 mL | Freq: Once | INTRAVENOUS | Status: AC
  Administered 2022-07-21: 10 mL

## 2022-07-21 MED ORDER — SODIUM CHLORIDE 0.9 % IV SOLN: INTRAVENOUS | Status: AC

## 2022-07-21 NOTE — Patient Instructions (Signed)
Hypokalemia Hypokalemia means that the amount of potassium in the blood is lower than normal. Potassium is a mineral (electrolyte) that helps regulate the amount of fluid in the body. It also stimulates muscle tightening (contraction) and helps nerves work properly. Normally, most of the body's potassium is inside cells, and only a very small amount is in the blood. Because the amount in the blood is so small, minor changes to potassium levels in the blood can be life-threatening. What are the causes? This condition may be caused by: Antibiotic medicine. Diarrhea or vomiting. Taking too much of a medicine that helps you have a bowel movement (laxative) can cause diarrhea and lead to hypokalemia. Chronic kidney disease (CKD). Medicines that help the body get rid of excess fluid (diuretics). Eating disorders, such as anorexia or bulimia. Low magnesium levels in the body. Sweating a lot. What are the signs or symptoms? Symptoms of this condition include: Weakness. Constipation. Fatigue. Muscle cramps. Mental confusion. Skipped heartbeats or irregular heartbeat (palpitations). Tingling or numbness. How is this diagnosed? This condition is diagnosed with a blood test. How is this treated? This condition may be treated by: Taking potassium supplements. Adjusting the medicines that you take. Eating more foods that contain a lot of potassium. If your potassium level is very low, you may need to get potassium through an IV and be monitored in the hospital. Follow these instructions at home: Eating and drinking  Eat a healthy diet. A healthy diet includes fresh fruits and vegetables, whole grains, healthy fats, and lean proteins. If told, eat more foods that contain a lot of potassium. These include: Nuts, such as peanuts and pistachios. Seeds, such as sunflower seeds and pumpkin seeds. Peas, lentils, and lima beans. Whole grain and bran cereals and breads. Fresh fruits and vegetables,  such as apricots, avocado, bananas, cantaloupe, kiwi, oranges, tomatoes, asparagus, and potatoes. Juices, such as orange, tomato, and prune. Lean meats, including fish. Milk and milk products, such as yogurt. General instructions Take over-the-counter and prescription medicines only as told by your health care provider. This includes vitamins, natural food products, and supplements. Keep all follow-up visits. This is important. Contact a health care provider if: You have weakness that gets worse. You feel your heart pounding or racing. You vomit. You have diarrhea. You have diabetes and you have trouble keeping your blood sugar in your target range. Get help right away if: You have chest pain. You have shortness of breath. You have vomiting or diarrhea that lasts for more than 2 days. You faint. These symptoms may be an emergency. Get help right away. Call 911. Do not wait to see if the symptoms will go away. Do not drive yourself to the hospital. Summary Hypokalemia means that the amount of potassium in the blood is lower than normal. This condition is diagnosed with a blood test. Hypokalemia may be treated by taking potassium supplements, adjusting the medicines that you take, or eating more foods that are high in potassium. If your potassium level is very low, you may need to get potassium through an IV and be monitored in the hospital. This information is not intended to replace advice given to you by your health care provider. Make sure you discuss any questions you have with your health care provider. Document Revised: 11/03/2020 Document Reviewed: 11/03/2020 Elsevier Patient Education  2023 Else[employer] Inc.  

## 2022-08-08 MED FILL — Fosaprepitant Dimeglumine For IV Infusion 150 MG (Base Eq): INTRAVENOUS | Qty: 5 | Status: AC

## 2022-08-09 ENCOUNTER — Inpatient Hospital Stay: Payer: 59 | Attending: Hematology and Oncology | Admitting: Hematology and Oncology

## 2022-08-09 ENCOUNTER — Other Ambulatory Visit: Payer: Self-pay

## 2022-08-09 ENCOUNTER — Inpatient Hospital Stay: Payer: 59

## 2022-08-09 VITALS — BP 176/78 | HR 93 | Temp 97.5°F | Resp 18 | Ht 69.0 in | Wt 306.3 lb

## 2022-08-09 VITALS — BP 143/79 | HR 76 | Resp 18

## 2022-08-09 DIAGNOSIS — R110 Nausea: Secondary | ICD-10-CM

## 2022-08-09 DIAGNOSIS — Z95828 Presence of other vascular implants and grafts: Secondary | ICD-10-CM

## 2022-08-09 DIAGNOSIS — Z5112 Encounter for antineoplastic immunotherapy: Secondary | ICD-10-CM | POA: Insufficient documentation

## 2022-08-09 DIAGNOSIS — Z885 Allergy status to narcotic agent status: Secondary | ICD-10-CM | POA: Diagnosis not present

## 2022-08-09 DIAGNOSIS — R059 Cough, unspecified: Secondary | ICD-10-CM | POA: Insufficient documentation

## 2022-08-09 DIAGNOSIS — D72819 Decreased white blood cell count, unspecified: Secondary | ICD-10-CM | POA: Diagnosis not present

## 2022-08-09 DIAGNOSIS — Z888 Allergy status to other drugs, medicaments and biological substances status: Secondary | ICD-10-CM | POA: Insufficient documentation

## 2022-08-09 DIAGNOSIS — M7989 Other specified soft tissue disorders: Secondary | ICD-10-CM | POA: Diagnosis not present

## 2022-08-09 DIAGNOSIS — C50511 Malignant neoplasm of lower-outer quadrant of right female breast: Secondary | ICD-10-CM | POA: Insufficient documentation

## 2022-08-09 DIAGNOSIS — C50919 Malignant neoplasm of unspecified site of unspecified female breast: Secondary | ICD-10-CM

## 2022-08-09 DIAGNOSIS — D6481 Anemia due to antineoplastic chemotherapy: Secondary | ICD-10-CM | POA: Diagnosis not present

## 2022-08-09 DIAGNOSIS — N6315 Unspecified lump in the right breast, overlapping quadrants: Secondary | ICD-10-CM | POA: Insufficient documentation

## 2022-08-09 DIAGNOSIS — Z170 Estrogen receptor positive status [ER+]: Secondary | ICD-10-CM | POA: Insufficient documentation

## 2022-08-09 DIAGNOSIS — G629 Polyneuropathy, unspecified: Secondary | ICD-10-CM | POA: Diagnosis not present

## 2022-08-09 DIAGNOSIS — E876 Hypokalemia: Secondary | ICD-10-CM | POA: Insufficient documentation

## 2022-08-09 DIAGNOSIS — R252 Cramp and spasm: Secondary | ICD-10-CM | POA: Diagnosis not present

## 2022-08-09 DIAGNOSIS — I700 Atherosclerosis of aorta: Secondary | ICD-10-CM | POA: Insufficient documentation

## 2022-08-09 DIAGNOSIS — T451X5A Adverse effect of antineoplastic and immunosuppressive drugs, initial encounter: Secondary | ICD-10-CM | POA: Diagnosis not present

## 2022-08-09 DIAGNOSIS — R197 Diarrhea, unspecified: Secondary | ICD-10-CM | POA: Diagnosis not present

## 2022-08-09 DIAGNOSIS — Z883 Allergy status to other anti-infective agents status: Secondary | ICD-10-CM | POA: Insufficient documentation

## 2022-08-09 DIAGNOSIS — Z79899 Other long term (current) drug therapy: Secondary | ICD-10-CM | POA: Diagnosis not present

## 2022-08-09 LAB — CBC WITH DIFFERENTIAL (CANCER CENTER ONLY)
Abs Immature Granulocytes: 0 10*3/uL (ref 0.00–0.07)
Basophils Absolute: 0 10*3/uL (ref 0.0–0.1)
Basophils Relative: 1 %
Eosinophils Absolute: 0.1 10*3/uL (ref 0.0–0.5)
Eosinophils Relative: 4 %
HCT: 31.1 % — ABNORMAL LOW (ref 36.0–46.0)
Hemoglobin: 10.4 g/dL — ABNORMAL LOW (ref 12.0–15.0)
Immature Granulocytes: 0 %
Lymphocytes Relative: 18 %
Lymphs Abs: 0.5 10*3/uL — ABNORMAL LOW (ref 0.7–4.0)
MCH: 29.9 pg (ref 26.0–34.0)
MCHC: 33.4 g/dL (ref 30.0–36.0)
MCV: 89.4 fL (ref 80.0–100.0)
Monocytes Absolute: 0.8 10*3/uL (ref 0.1–1.0)
Monocytes Relative: 28 %
Neutro Abs: 1.4 10*3/uL — ABNORMAL LOW (ref 1.7–7.7)
Neutrophils Relative %: 49 %
Platelet Count: 165 10*3/uL (ref 150–400)
RBC: 3.48 MIL/uL — ABNORMAL LOW (ref 3.87–5.11)
RDW: 17 % — ABNORMAL HIGH (ref 11.5–15.5)
WBC Count: 2.8 10*3/uL — ABNORMAL LOW (ref 4.0–10.5)
nRBC: 0 % (ref 0.0–0.2)

## 2022-08-09 LAB — CMP (CANCER CENTER ONLY)
ALT: 14 U/L (ref 0–44)
AST: 31 U/L (ref 15–41)
Albumin: 3.4 g/dL — ABNORMAL LOW (ref 3.5–5.0)
Alkaline Phosphatase: 89 U/L (ref 38–126)
Anion gap: 6 (ref 5–15)
BUN: 6 mg/dL (ref 6–20)
CO2: 28 mmol/L (ref 22–32)
Calcium: 8.9 mg/dL (ref 8.9–10.3)
Chloride: 107 mmol/L (ref 98–111)
Creatinine: 0.77 mg/dL (ref 0.44–1.00)
GFR, Estimated: >60 mL/min (ref >60)
Glucose, Bld: 104 mg/dL — ABNORMAL HIGH (ref 70–99)
Potassium: 3.2 mmol/L — ABNORMAL LOW (ref 3.5–5.1)
Sodium: 141 mmol/L (ref 135–145)
Total Bilirubin: 1.8 mg/dL — ABNORMAL HIGH (ref 0.3–1.2)
Total Protein: 6.4 g/dL — ABNORMAL LOW (ref 6.5–8.1)

## 2022-08-09 MED ORDER — ACETAMINOPHEN 325 MG PO TABS
650.0000 mg | ORAL_TABLET | Freq: Once | ORAL | Status: AC
  Administered 2022-08-09: 650 mg via ORAL
  Filled 2022-08-09: qty 2

## 2022-08-09 MED ORDER — SODIUM CHLORIDE 0.9% FLUSH
10.0000 mL | INTRAVENOUS | Status: DC | PRN
  Administered 2022-08-09: 10 mL

## 2022-08-09 MED ORDER — SODIUM CHLORIDE 0.9 % IV SOLN: Freq: Once | INTRAVENOUS | Status: AC

## 2022-08-09 MED ORDER — SODIUM CHLORIDE 0.9% FLUSH
10.0000 mL | Freq: Once | INTRAVENOUS | Status: AC
  Administered 2022-08-09: 10 mL

## 2022-08-09 MED ORDER — FAM-TRASTUZUMAB DERUXTECAN-NXKI CHEMO 100 MG IV SOLR
300.0000 mg | Freq: Once | INTRAVENOUS | Status: AC
  Administered 2022-08-09: 300 mg via INTRAVENOUS
  Filled 2022-08-09: qty 15

## 2022-08-09 MED ORDER — DEXTROSE 5 % IV SOLN: Freq: Once | INTRAVENOUS | Status: AC

## 2022-08-09 MED ORDER — POTASSIUM CHLORIDE 10 MEQ/100ML IV SOLN
10.0000 meq | Freq: Once | INTRAVENOUS | Status: AC
  Administered 2022-08-09: 10 meq via INTRAVENOUS
  Filled 2022-08-09: qty 100

## 2022-08-09 MED ORDER — HEPARIN SOD (PORK) LOCK FLUSH 100 UNIT/ML IV SOLN
500.0000 [IU] | Freq: Once | INTRAVENOUS | Status: AC | PRN
  Administered 2022-08-09: 500 [IU]

## 2022-08-09 MED ORDER — CYANOCOBALAMIN 1000 MCG/ML IJ SOLN
1000.0000 ug | Freq: Once | INTRAMUSCULAR | Status: AC
  Administered 2022-08-09: 1000 ug via INTRAMUSCULAR
  Filled 2022-08-09: qty 1

## 2022-08-09 MED ORDER — PALONOSETRON HCL INJECTION 0.25 MG/5ML
0.2500 mg | Freq: Once | INTRAVENOUS | Status: AC
  Administered 2022-08-09: 0.25 mg via INTRAVENOUS
  Filled 2022-08-09: qty 5

## 2022-08-09 MED ORDER — SODIUM CHLORIDE 0.9 % IV SOLN
150.0000 mg | Freq: Once | INTRAVENOUS | Status: AC
  Administered 2022-08-09: 150 mg via INTRAVENOUS
  Filled 2022-08-09: qty 150

## 2022-08-09 NOTE — Assessment & Plan Note (Signed)
06/02/2020:Right lumpectomy ([facility]): invasive and in situ ductal carcinoma, 1.3cm, clear margins, 1 right axillary lymph node negative for carcinoma. ER 15% weak, PR-,HER-2 positive (3+) Ki67 80%.   Treatment plan: 1.  Adjuvant chemotherapy with Taxol Herceptin followed by Herceptin maintenance.  Stopped after 11 cycles of Taxol Herceptin maintenance completed 07/07/2021  2.  Adjuvant radiation therapy 11/23/2020-12/19/2020 3.  Adjuvant antiestrogen therapy with tamoxifen started October 2022 4.  CT angiogram 07/13/2021: Right supraclavicular lymph node 5 cm, right internal mammary lymph nodes 1.8 cm 5.  Metastatic breast cancer: June 2023:Right supraclavicular lymphadenopathy: Biopsy: Metastatic poorly differentiated adenocarcinoma, ER +40%, PR negative, HER2 positive 3+ PET CT scan 08/06/21: Ext Right Supra clav LN along with Internal mammary and Rt Upper Mediastinal and Rt Axillary LN -----------------------------------------------------------------------------------------------------  Current treatment: Enhertu started on 08/21/2021, (this treatment on hold) today is cycle 17   Enhertu toxicities: 1,.  Severe nausea, requiring IV fluids: Better since we added Emend 2. Chemotherapy-induced anemia: Monitoring closely, hemoglobin 11.1 3.  Hospitalization 11/26/2021-11/28/2021: Dizziness and fatigue, esophageal dysmotility 4.  Hypokalemia and diarrhea: Gets IV fluids on Saturdays 5.  Leukopenia: ANC 1.1: Okay to treat.  Enhertu dose reduced   Echocardiogram has been scheduled for June.  Okay to treat with echocardiogram done in January.   CT chest abdomen pelvis 06/25/2022: Stable exam.  No new or progressive findings. Next CT scans will be done at the end of July or first week of August.   Return to clinic in 3 weeks and we may consider Neupogen injections if she is persistently has neutropenia.

## 2022-08-09 NOTE — Progress Notes (Signed)
Patient Care Team: Pamela Peng, MD as PCP - General (Internal Medicine) Pamela Hitch, MD as Consulting Physician (Orthopedic Surgery) Pamela Proud, RN as Oncology Nurse Navigator Pamela Angelica, RN as Oncology Nurse Navigator Pamela Bouillon, MD as Consulting Physician (General Surgery) Pamela Croissant, MD as Consulting Physician (Hematology and Oncology) Pamela Peak, MD as Attending Physician (Radiation Oncology)  DIAGNOSIS:  Encounter Diagnosis  Name Primary?   Malignant neoplasm of lower-outer quadrant of right breast of female, estrogen receptor positive (HCC) Yes    SUMMARY OF ONCOLOGIC HISTORY: Oncology History  Malignant neoplasm of lower-outer quadrant of right breast of female, estrogen receptor positive (HCC)  05/03/2020 Initial Diagnosis   Screening mammogram showed a 1.0cm mass at the 6 o'clock position in the right breast. Diagnostic mammogram and US showed the 1.2cm mass at the 6 o'clock position in the right breast. Biopsy showed IDC, grade 3, HER-2 positive (3+), ER 15% weak, PR-, Ki67 80%.   05/19/2020 Genetic Testing   Negative hereditary cancer genetic testing: no pathogenic variants detected in [facility] Breast STAT Panel and Common Hereditary Cancers Panel.  The report dates are May 19, 2020 (STAT) and May 23, 2020 (Common Hereditary).   The Common Hereditary Cancers Panel offered by [facility] includes sequencing and/or deletion duplication testing of the following 47 genes: APC, ATM, AXIN2, BARD1, BMPR1A, BRCA1, BRCA2, BRIP1, CDH1, CDK4, CDKN2A (p14ARF), CDKN2A (p16INK4a), CHEK2, CTNNA1, DICER1, EPCAM (Deletion/duplication testing only), GREM1 (promoter region deletion/duplication testing only), GREM1, HOXB13, KIT, MEN1, MLH1, MSH2, MSH3, MSH6, MUTYH, NBN, NF1, NHTL1, PALB2, PDGFRA, PMS2, POLD1, POLE, PTEN, RAD50, RAD51C, RAD51D, SDHA, SDHB, SDHC, SDHD, SMAD4, SMARCA4. STK11, TP53, TSC1, TSC2, and VHL.  The following genes were evaluated for sequence  changes only: SDHA and HOXB13 c.251G>A variant only.es only: SDHA and HOXB13 c.251G>A variant only.   06/02/2020 Surgery   Right lumpectomy ([facility]): invasive and in situ ductal carcinoma, 1.3cm, clear margins, 1 right axillary lymph node negative for carcinoma.   06/02/2020 Cancer Staging   Staging form: Breast, AJCC 8th Edition - Pathologic stage from 06/02/2020: Stage IA (pT1c, pN0, cM0, G3, ER+, PR-, HER2+) - Signed by Loa Socks, NP on 06/15/2020 Stage prefix: Initial diagnosis Histologic grading system: 3 grade system   07/08/2020 - 10/21/2020 Chemotherapy   Taxol Herceptin followed by Herceptin maintenance   04/14/2021 -  Anti-estrogen oral therapy   Tamoxifen 10 mg   07/13/2021 Imaging   CT angiogram: Right supraclavicular lymph node 5 cm, right internal mammary lymph nodes 1.8 cm    08/06/2021 PET scan   PET CT scan: Ext Right Supra clav LN along with Internal mammary and Rt Upper Mediastinal and Rt Axillary LN   08/2021 Initial Biopsy   Right supraclavicular lymphadenopathy: Biopsy: Metastatic poorly differentiated adenocarcinoma, ER +40%, PR negative, HER2 positive 3+    08/21/2021 -  Chemotherapy   Enhertu every 3 weeks    03/08/2022 Imaging   CT chest/abdomen/pelvis  1. No new or progressive findings in the chest, abdomen or pelvis. 2. Signs of RIGHT breast lumpectomy with areas of central fat necrosis unchanged from previous imaging. 3. Stable small lymph nodes throughout the chest, largest with area of soft tissue about surgical clips in the RIGHT axilla. 4. Stable appearance of thoracic inlet lymph nodes, not shown to be hypermetabolic on prior PET imaging. 5. IUD in-situ. 6. Aortic atherosclerosis.     CHIEF COMPLIANT: Cycle 17 Enhertu  INTERVAL HISTORY: Pamela [name] is a 57 y.o. with the above-mentioned metastatic breast cancer  currently on treatment with Enhertu. She presents to the clinic for a follow-up. She complains of having a cold and says  now she is retaining fluid. She is doing a lot of sitting at work. Feet is swollen when she wakes up in the morning.  ALLERGIES:  is allergic to pollen extract, compazine [prochlorperazine], latex, codeine, diflucan [fluconazole], shellfish allergy, and betadine [povidone iodine].  MEDICATIONS:  Current Outpatient Medications  Medication Sig Dispense Refill   amLODipine (NORVASC) 10 MG tablet Take 1 tablet (10 mg total) by mouth daily. 90 tablet 2   aspirin EC 81 MG tablet Take 1 tablet (81 mg total) by mouth daily. Swallow whole. 30 tablet 11   cetirizine (ZYRTEC ALLERGY) 10 MG tablet Take 1 tablet (10 mg total) by mouth daily. 30 tablet 2   EDARBYCLOR 40-25 MG TABS Take 1 tablet by mouth daily. 90 tablet 2   HYDROcodone bit-homatropine (HYDROMET) 5-1.5 MG/5ML syrup Take 5 mLs by mouth every 6 (six) hours as needed. 120 mL 0   levonorgestrel (MIRENA, 52 MG,) 20 MCG/DAY IUD 1 each by Intrauterine route once.     lidocaine-prilocaine (EMLA) cream Apply to affected area once AS DIRECTED 30 g 3   linaclotide (LINZESS) 72 MCG capsule Take 1 capsule (72 mcg total) by mouth daily before breakfast. 30 capsule 0   potassium chloride SA (KLOR-CON M) 20 MEQ tablet Take 1 tablet (20 mEq total) by mouth daily. 30 tablet 0   prochlorperazine (COMPAZINE) 10 MG tablet Take 1 tablet (10 mg total) by mouth every 6 (six) hours as needed (Nausea or vomiting). 30 tablet 1   rosuvastatin (CRESTOR) 40 MG tablet Take 1 tablet (40 mg total) by mouth daily. 90 tablet 3   venlafaxine XR (EFFEXOR-XR) 37.5 MG 24 hr capsule Take 1 capsule (37.5 mg total) by mouth daily with breakfast. 30 capsule 6   No current facility-administered medications for this visit.   Facility-Administered Medications Ordered in Other Visits  Medication Dose Route Frequency Provider Last Rate Last Admin   heparin lock flush 100 unit/mL  500 Units Intracatheter Once Pamela Croissant, MD       sodium chloride flush (NS) 0.9 % injection 10 mL  10 mL  Intracatheter Once Pamela Croissant, MD        PHYSICAL EXAMINATION: ECOG PERFORMANCE STATUS: 1 - Symptomatic but completely ambulatory  Vitals:   08/09/22 0913  BP: (!) 176/78  Pulse: 93  Resp: 18  Temp: (!) 97.5 F (36.4 C)  SpO2: 99%   Filed Weights   08/09/22 0913  Weight: (!) 306 lb 4.8 oz (138.9 kg)     LABORATORY DATA:  I have reviewed the data as listed    Latest Ref Rng & Units 07/20/2022    8:59 AM 06/27/2022   10:39 AM 06/07/2022    8:52 AM  CMP  Glucose 70 - 99 mg/dL 96  99  161   BUN 6 - 20 mg/dL 6  7  9    Creatinine 0.44 - 1.00 mg/dL 0.96  0.45  4.09   Sodium 135 - 145 mmol/L 142  142  141   Potassium 3.5 - 5.1 mmol/L 3.2  3.3  3.1   Chloride 98 - 111 mmol/L 107  107  106   CO2 22 - 32 mmol/L 30  30  29    Calcium 8.9 - 10.3 mg/dL 8.8  9.5  9.2   Total Protein 6.5 - 8.1 g/dL 6.3  6.6  6.3   Total Bilirubin 0.3 -  1.2 mg/dL 1.6  1.5  1.3   Alkaline Phos 38 - 126 U/L 93  81  86   AST 15 - 41 U/L 29  31  25    ALT 0 - 44 U/L 13  16  12      Lab Results  Component Value Date   WBC 2.8 (L) 08/09/2022   HGB 10.4 (L) 08/09/2022   HCT 31.1 (L) 08/09/2022   MCV 89.4 08/09/2022   PLT 165 08/09/2022   NEUTROABS 1.4 (L) 08/09/2022    ASSESSMENT & PLAN:  Malignant neoplasm of lower-outer quadrant of right breast of female, estrogen receptor positive (HCC) 06/02/2020:Right lumpectomy ([facility]): invasive and in situ ductal carcinoma, 1.3cm, clear margins, 1 right axillary lymph node negative for carcinoma. ER 15% weak, PR-,HER-2 positive (3+) Ki67 80%.   Treatment plan: 1.  Adjuvant chemotherapy with Taxol Herceptin followed by Herceptin maintenance.  Stopped after 11 cycles of Taxol Herceptin maintenance completed 07/07/2021  2.  Adjuvant radiation therapy 11/23/2020-12/19/2020 3.  Adjuvant antiestrogen therapy with tamoxifen started October 2022 4.  CT angiogram 07/13/2021: Right supraclavicular lymph node 5 cm, right internal mammary lymph nodes 1.8 cm 5.  Metastatic  breast cancer: June 2023:Right supraclavicular lymphadenopathy: Biopsy: Metastatic poorly differentiated adenocarcinoma, ER +40%, PR negative, HER2 positive 3+ PET CT scan 08/06/21: Ext Right Supra clav LN along with Internal mammary and Rt Upper Mediastinal and Rt Axillary LN -----------------------------------------------------------------------------------------------------  Current treatment: Enhertu started on 08/21/2021, (this treatment on hold) today is cycle 17   Enhertu toxicities: 1,.  Severe nausea, requiring IV fluids: Better since we added Emend 2. Chemotherapy-induced anemia: Monitoring closely, hemoglobin 11.1 3.  Hospitalization 11/26/2021-11/28/2021: Dizziness and fatigue, esophageal dysmotility 4.  Hypokalemia and diarrhea: Gets IV fluids on Saturdays 5.  Leukopenia: ANC 1.1: Okay to treat.  Enhertu dose reduced   Echocardiogram has been scheduled for June.     CT chest abdomen pelvis 06/25/2022: Stable exam.  No new or progressive findings. Next CT scans will be done 10/05/2022   Cold symptoms: Patient will take over-the-counter cold medication.  She will call us if she develops fevers. Leg swelling: I discussed with her about holding IV fluids on Saturdays.  She will also try using compression socks.  Return to clinic in 3 weeks    Orders Placed This Encounter  Procedures   CT CHEST ABDOMEN PELVIS W CONTRAST    Standing Status:   Future    Standing Expiration Date:   08/09/2023    Order Specific Question:   If indicated for the ordered procedure, I authorize the administration of contrast media per Radiology protocol    Answer:   Yes    Order Specific Question:   Does the patient have a contrast media/X-ray dye allergy?    Answer:   No    Order Specific Question:   Is patient pregnant?    Answer:   No    Order Specific Question:   Preferred imaging location?    Answer:   [facility]    Order Specific Question:   If indicated for the ordered procedure, I authorize  the administration of oral contrast media per Radiology protocol    Answer:   Yes   The patient has a good understanding of the overall plan. she agrees with it. she will call with any problems that may develop before the next visit here. Total time spent: 30 mins including face to face time and time spent for planning, charting and co-ordination of care  Tamsen Meek, MD 08/09/22    I Janan Ridge am acting as a Neurosurgeon for [employer]  I have reviewed the above documentation for accuracy and completeness, and I agree with the above.

## 2022-08-09 NOTE — Patient Instructions (Signed)
[facility]  Discharge Instructions: Thank you for choosing [facility] to provide your oncology and hematology care.   If you have a lab appointment with the Cancer Center, please go directly to the Cancer Center and check in at the registration area.   Wear comfortable clothing and clothing appropriate for easy access to any Portacath or PICC line.   We strive to give you quality time with your provider. You may need to reschedule your appointment if you arrive late (15 or more minutes).  Arriving late affects you and other patients whose appointments are after yours.  Also, if you miss three or more appointments without notifying the office, you may be dismissed from the clinic at the provider's discretion.      For prescription refill requests, have your pharmacy contact our office and allow 72 hours for refills to be completed.    Today you received the following chemotherapy and/or immunotherapy agents: Enhertu, Fluids, potassium, B-12     To help prevent nausea and vomiting after your treatment, we encourage you to take your nausea medication as directed.  BELOW ARE SYMPTOMS THAT SHOULD BE REPORTED IMMEDIATELY: *FEVER GREATER THAN 100.4 F (38 C) OR HIGHER *CHILLS OR SWEATING *NAUSEA AND VOMITING THAT IS NOT CONTROLLED WITH YOUR NAUSEA MEDICATION *UNUSUAL SHORTNESS OF BREATH *UNUSUAL BRUISING OR BLEEDING *URINARY PROBLEMS (pain or burning when urinating, or frequent urination) *BOWEL PROBLEMS (unusual diarrhea, constipation, pain near the anus) TENDERNESS IN MOUTH AND THROAT WITH OR WITHOUT PRESENCE OF ULCERS (sore throat, sores in mouth, or a toothache) UNUSUAL RASH, SWELLING OR PAIN  UNUSUAL VAGINAL DISCHARGE OR ITCHING   Items with * indicate a potential emergency and should be followed up as soon as possible or go to the Emergency Department if any problems should occur.  Please show the CHEMOTHERAPY ALERT CARD or  IMMUNOTHERAPY ALERT CARD at check-in to the Emergency Department and triage nurse.  Should you have questions after your visit or need to cancel or reschedule your appointment, please contact [facility]  Dept: [phone]  and follow the prompts.  Office hours are 8:00 a.m. to 4:30 p.m. Monday - Friday. Please note that voicemails left after 4:00 p.m. may not be returned until the following business day.  We are closed weekends and major holidays. You have access to a nurse at all times for urgent questions. Please call the main number to the clinic Dept: [phone] and follow the prompts.   For any non-urgent questions, you may also contact your provider using MyChart. We now offer e-Visits for anyone 74 and older to request care online for non-urgent symptoms. For details visit [email or web address].   Also download the MyChart app! Go to the app store, search "MyChart", open the app, select , and log in with your MyChart username and password.  Hypokalemia Hypokalemia means that the amount of potassium in the blood is lower than normal. Potassium is a mineral (electrolyte) that helps regulate the amount of fluid in the body. It also stimulates muscle tightening (contraction) and helps nerves work properly. Normally, most of the body's potassium is inside cells, and only a very small amount is in the blood. Because the amount in the blood is so small, minor changes to potassium levels in the blood can be life-threatening. What are the causes? This condition may be caused by: Antibiotic medicine. Diarrhea or vomiting. Taking too much of a medicine that helps you  have a bowel movement (laxative) can cause diarrhea and lead to hypokalemia. Chronic kidney disease (CKD). Medicines that help the body get rid of excess fluid (diuretics). Eating disorders, such as anorexia or bulimia. Low magnesium levels in the body. Sweating a lot. What are the  signs or symptoms? Symptoms of this condition include: Weakness. Constipation. Fatigue. Muscle cramps. Mental confusion. Skipped heartbeats or irregular heartbeat (palpitations). Tingling or numbness. How is this diagnosed? This condition is diagnosed with a blood test. How is this treated? This condition may be treated by: Taking potassium supplements. Adjusting the medicines that you take. Eating more foods that contain a lot of potassium. If your potassium level is very low, you may need to get potassium through an IV and be monitored in the hospital. Follow these instructions at home: Eating and drinking  Eat a healthy diet. A healthy diet includes fresh fruits and vegetables, whole grains, healthy fats, and lean proteins. If told, eat more foods that contain a lot of potassium. These include: Nuts, such as peanuts and pistachios. Seeds, such as sunflower seeds and pumpkin seeds. Peas, lentils, and lima beans. Whole grain and bran cereals and breads. Fresh fruits and vegetables, such as apricots, avocado, bananas, cantaloupe, kiwi, oranges, tomatoes, asparagus, and potatoes. Juices, such as orange, tomato, and prune. Lean meats, including fish. Milk and milk products, such as yogurt. General instructions Take over-the-counter and prescription medicines only as told by your health care provider. This includes vitamins, natural food products, and supplements. Keep all follow-up visits. This is important. Contact a health care provider if: You have weakness that gets worse. You feel your heart pounding or racing. You vomit. You have diarrhea. You have diabetes and you have trouble keeping your blood sugar in your target range. Get help right away if: You have chest pain. You have shortness of breath. You have vomiting or diarrhea that lasts for more than 2 days. You faint. These symptoms may be an emergency. Get help right away. Call 911. Do not wait to see if the  symptoms will go away. Do not drive yourself to the hospital. Summary Hypokalemia means that the amount of potassium in the blood is lower than normal. This condition is diagnosed with a blood test. Hypokalemia may be treated by taking potassium supplements, adjusting the medicines that you take, or eating more foods that are high in potassium. If your potassium level is very low, you may need to get potassium through an IV and be monitored in the hospital. This information is not intended to replace advice given to you by your health care provider. Make sure you discuss any questions you have with your health care provider. Document Revised: 11/03/2020 Document Reviewed: 11/03/2020 Elsevier Patient Education  2023 [employer].

## 2022-08-09 NOTE — Progress Notes (Signed)
Per Dr. Pamelia Hoit- ok to treat today with an ANC of 1.4 and T.bili of 1.8. January's ECHO is good for today's treatment- patient has a scheduled ECHO for June 18th of this month- per Dr. Pamelia Hoit.

## 2022-08-10 ENCOUNTER — Telehealth: Payer: Self-pay

## 2022-08-10 MED ORDER — AZITHROMYCIN 250 MG PO TABS: ORAL_TABLET | ORAL | 0 refills | Status: DC

## 2022-08-10 MED ORDER — BENZONATATE 100 MG PO CAPS: 100.0000 mg | ORAL_CAPSULE | Freq: Three times a day (TID) | ORAL | 0 refills | Status: DC | PRN

## 2022-08-10 NOTE — Telephone Encounter (Signed)
Pt called and states she s/w Dr Pamelia Hoit yesterday concerning cold sx. She reports post nasal drip nasal and chest congestion. Per MD, order entered for z-pak and tessalon for cough. Pt is aware and verbalized thanks. Pt knows if she should developed labored breathing, SHOB she should go to ED since it is the weekend. She verbalized understanding.

## 2022-08-11 ENCOUNTER — Inpatient Hospital Stay: Payer: 59

## 2022-08-21 ENCOUNTER — Encounter (HOSPITAL_COMMUNITY): Payer: Self-pay | Admitting: Internal Medicine

## 2022-08-21 ENCOUNTER — Ambulatory Visit (HOSPITAL_COMMUNITY)
Admission: RE | Admit: 2022-08-21 | Discharge: 2022-08-21 | Disposition: A | Discharge status: Home / Self Care | Payer: 59 | Source: Ambulatory Visit | Attending: Internal Medicine | Admitting: Internal Medicine

## 2022-08-21 ENCOUNTER — Other Ambulatory Visit: Payer: Self-pay

## 2022-08-21 ENCOUNTER — Ambulatory Visit (HOSPITAL_BASED_OUTPATIENT_CLINIC_OR_DEPARTMENT_OTHER)
Admission: RE | Admit: 2022-08-21 | Discharge: 2022-08-21 | Disposition: A | Discharge status: Home / Self Care | Payer: 59 | Source: Ambulatory Visit | Attending: Internal Medicine | Admitting: Internal Medicine

## 2022-08-21 VITALS — BP 108/60 | HR 76 | Wt 300.0 lb

## 2022-08-21 DIAGNOSIS — I427 Cardiomyopathy due to drug and external agent: Secondary | ICD-10-CM | POA: Insufficient documentation

## 2022-08-21 DIAGNOSIS — I110 Hypertensive heart disease with heart failure: Secondary | ICD-10-CM | POA: Insufficient documentation

## 2022-08-21 DIAGNOSIS — C50511 Malignant neoplasm of lower-outer quadrant of right female breast: Secondary | ICD-10-CM | POA: Diagnosis not present

## 2022-08-21 DIAGNOSIS — I509 Heart failure, unspecified: Secondary | ICD-10-CM

## 2022-08-21 DIAGNOSIS — T421X5A Adverse effect of iminostilbenes, initial encounter: Secondary | ICD-10-CM | POA: Insufficient documentation

## 2022-08-21 DIAGNOSIS — T451X5A Adverse effect of antineoplastic and immunosuppressive drugs, initial encounter: Secondary | ICD-10-CM

## 2022-08-21 DIAGNOSIS — Z170 Estrogen receptor positive status [ER+]: Secondary | ICD-10-CM | POA: Diagnosis not present

## 2022-08-21 DIAGNOSIS — Z01818 Encounter for other preprocedural examination: Secondary | ICD-10-CM | POA: Diagnosis present

## 2022-08-21 DIAGNOSIS — E876 Hypokalemia: Secondary | ICD-10-CM

## 2022-08-21 DIAGNOSIS — R600 Localized edema: Secondary | ICD-10-CM | POA: Diagnosis not present

## 2022-08-21 DIAGNOSIS — G4733 Obstructive sleep apnea (adult) (pediatric): Secondary | ICD-10-CM | POA: Diagnosis not present

## 2022-08-21 DIAGNOSIS — Z6841 Body Mass Index (BMI) 40.0 and over, adult: Secondary | ICD-10-CM | POA: Insufficient documentation

## 2022-08-21 DIAGNOSIS — I10 Essential (primary) hypertension: Secondary | ICD-10-CM | POA: Diagnosis not present

## 2022-08-21 DIAGNOSIS — I340 Nonrheumatic mitral (valve) insufficiency: Secondary | ICD-10-CM | POA: Insufficient documentation

## 2022-08-21 LAB — ECHOCARDIOGRAM COMPLETE
Area-P 1/2: 2.99 cm2
Calc EF: 58.3 %
S' Lateral: 3.3 cm
Single Plane A2C EF: 62.1 %
Single Plane A4C EF: 54.1 %

## 2022-08-21 MED ORDER — FUROSEMIDE 20 MG PO TABS: 20.0000 mg | ORAL_TABLET | ORAL | 3 refills | Status: DC | PRN

## 2022-08-21 MED ORDER — POTASSIUM CHLORIDE CRYS ER 10 MEQ PO TBCR: 40.0000 meq | EXTENDED_RELEASE_TABLET | Freq: Every day | ORAL | 3 refills | Status: DC

## 2022-08-21 NOTE — Progress Notes (Signed)
Echocardiogram 2D Echocardiogram has been performed.  Pamela [name] 08/21/2022, 2:38 PM

## 2022-08-21 NOTE — Patient Instructions (Signed)
Medication Changes:  TAKE LASIX (FUROSEMIDE) 20MG - TAKE 1 TABLET AS NEEDED FOR SWELLING OR SHORTNESS OF BREATH OR WEIGHT GAIN. 3 POUNDS WITHIN 24HR OR 5 POUNDS 7 DAYS  TAKE POTASSIUM 40 MEQ (4 TABLETS) - CHECK DOSAGE OF TABLETS TO ENSURE ONCE DAILY   Lab Work:  NONE ORDERED   Testing/Procedures:  Your physician has requested that you have an echocardiogram. Echocardiography is a painless test that uses sound waves to create images of your heart. It provides your doctor with information about the size and shape of your heart and how well your heart's chambers and valves are working. This procedure takes approximately one hour. There are no restrictions for this procedure. Please do NOT wear cologne, perfume, aftershave, or lotions (deodorant is allowed). Please arrive 15 minutes prior to your appointment time.   Follow-Up in: 4 MONTHS WITH ECHOCARDIOGRAM AROUND OCTOBER 2024  At the [facility], you and your health needs are our priority. We have a designated team specialized in the treatment of Heart Failure. This Care Team includes your primary Heart Failure Specialized Cardiologist (physician), Advanced Practice Providers (APPs- Physician Assistants and Nurse Practitioners), and Pharmacist who all work together to provide you with the care you need, when you need it.   You may see any of the following providers on your designated Care Team at your next follow up:  Dr. Arvilla Meres Dr. Marca Ancona Dr. Marcos Eke, NP Robbie Lis, [place] [facility], [place] Brynda Peon, NP Karle Plumber, PharmD   Please be sure to bring in all your medications bottles to every appointment.   Need to Contact us:  If you have any questions or concerns before your next appointment please send [place] a message through [place] or call our office at [phone].    TO LEAVE A MESSAGE FOR THE NURSE SELECT OPTION 2, PLEASE LEAVE A MESSAGE  INCLUDING: YOUR NAME DATE OF BIRTH CALL BACK NUMBER REASON FOR CALL**this is important as we prioritize the call backs  YOU WILL RECEIVE A CALL BACK THE SAME DAY AS LONG AS YOU CALL BEFORE 4:00 PM

## 2022-08-21 NOTE — Progress Notes (Signed)
CARDIO-ONCOLOGY CLINIC NOTE  Referring Physician: Dr. Pamelia Hoit Primary Care: Dorothyann Peng, MD Primary Cardiologist: Dr. Allyson Sabal  HPI:  Pamela [name] is 57 y.o. female with obesity, HTN, HL and right breast cancer  diagnosed referred by Dr. Pamelia Hoit  for enrollment into the Cardio-Oncology program.   Malignant neoplasm of lower-outer quadrant of right breast of female, estrogen receptor positive (HCC)  05/03/2020 Initial Diagnosis    Screening mammogram showed a 1.0cm mass at the 6 o'clock position in the right breast. Diagnostic mammogram and US showed the 1.2cm mass at the 6 o'clock position in the right breast. Biopsy showed IDC, grade 3, HER-2 positive (3+), ER 15% weak, PR-, Ki67 80%.    05/11/2020 Cancer Staging    Staging form: Breast, AJCC 8th Edition - Clinical stage from 05/11/2020: Stage IA (cT1b, cN0, cM0, G3, ER+, PR-, HER2+) - Signed by Serena Croissant, MD on 05/11/2020 Stage prefix: Initial diagnosis    05/19/2020 Genetic Testing    Negative hereditary cancer genetic testing: no pathogenic variants detected in [facility] Breast STAT Panel and Common Hereditary Cancers Panel.  The report dates are May 19, 2020 (STAT) and May 23, 2020 (Common Hereditary).    The Common Hereditary Cancers Panel offered by [facility] includes sequencing and/or deletion duplication testing of the following 47 genes: APC, ATM, AXIN2, BARD1, BMPR1A, BRCA1, BRCA2, BRIP1, CDH1, CDK4, CDKN2A (p14ARF), CDKN2A (p16INK4a), CHEK2, CTNNA1, DICER1, EPCAM (Deletion/duplication testing only), GREM1 (promoter region deletion/duplication testing only), GREM1, HOXB13, KIT, MEN1, MLH1, MSH2, MSH3, MSH6, MUTYH, NBN, NF1, NHTL1, PALB2, PDGFRA, PMS2, POLD1, POLE, PTEN, RAD50, RAD51C, RAD51D, SDHA, SDHB, SDHC, SDHD, SMAD4, SMARCA4. STK11, TP53, TSC1, TSC2, and VHL.  The following genes were evaluated for sequence changes only: SDHA and HOXB13 c.251G>A variant only.es only: SDHA and HOXB13 c.251G>A variant only.    06/02/2020 Surgery     Right lumpectomy ([facility]): invasive and in situ ductal carcinoma, 1.3cm, clear margins, 1 right axillary lymph node negative for carcinoma.    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 Denies known heart disease.  Diagnosed with HER-2+ breast CA in 3/22. Treated with lumpectomy in 3/22. Then Taxol/Herceptin 12 rounds from 5/22-8/22. Now on Herceptin q 3 weeks. Finished XRT in 10/22.   Finished Herceptin 07/08/21. Found to have recurrent disease in lymph node in her neck. Enhertu started on 08/21/21. Admitted 9/23 with fatigue and hypokalemia. Remains on Enhertu. Recent scan ok. Having some swelling with recent cold symptoms. Remains on abx. No orthopnea or PND.   Echo today 08/21/22 EF 60-65% Personally reviewed   Echo 03/28/21: EF 55-60% mod LVVH G1DD GLS -18.0  Echo 09/21/21 EF 60-65% Mild to mod LVH G1DD GLS -15.9% (underestimated)  Echo 4/23 EF 55% GLS -19.9% Echo1/17/22 Echo 55-60%  Mod LVH G2DD GLS 15.0% Echo 05/20/20: EF 60-65% GLS -21.2% Echo 08/30/20: EF 60-65% GLS - 20.7% Echo 10/22: EF 60-65% GLS reported as -13.3%    Past Medical History:  Diagnosis Date   Anxiety    Asthma    Back  pain    Breast cancer (HCC)    Constipation    DUB (dysfunctional uterine bleeding) 2007   Edema of both lower extremities    Elevated cholesterol    Family history of breast cancer 05/12/2020   Family history of lung cancer 05/12/2020   Food allergy    H/O menorrhagia 05/01/2006   High cholesterol    History of ovarian cyst 2007   Hypertension 03/31/2005    Increased BMI 07/11/2006   Joint pain    Migraine    N&V (nausea and vomiting)    Obesity 2007   Obstructive sleep apnea syndrome, moderate 10/27/2013   no cpap   Personal history of chemotherapy    Personal history of radiation therapy    Sleep apnea    SOB (shortness of breath)    Vitamin D deficiency    Vitamin D deficiency    Weight loss 07/11/2006    Current Outpatient Medications  Medication Sig Dispense Refill   amLODipine (NORVASC) 10 MG tablet Take 1 tablet (10 mg total) by mouth daily. 90 tablet 2   aspirin EC 81 MG tablet Take 1 tablet (81 mg total) by mouth daily. Swallow whole. 30 tablet 11   azithromycin (ZITHROMAX Z-PAK) 250 MG tablet Take as directed 6 each 0   benzonatate (TESSALON) 100 MG capsule Take 1 capsule (100 mg total) by mouth 3 (three) times daily as needed for cough. 30 capsule 0   cetirizine (ZYRTEC ALLERGY) 10 MG tablet Take 1 tablet (10 mg total) by mouth daily. 30 tablet 2   EDARBYCLOR 40-25 MG TABS Take 1 tablet by mouth daily. 90 tablet 2   levonorgestrel (MIRENA, 52 MG,) 20 MCG/DAY IUD 1 each by Intrauterine route once.     lidocaine-prilocaine (EMLA) cream Apply to affected area once AS DIRECTED 30 g 3   linaclotide (LINZESS) 72 MCG capsule Take 1 capsule (72 mcg total) by mouth daily before breakfast. 30 capsule 0   potassium chloride SA (KLOR-CON M) 20 MEQ tablet Take 1 tablet (20 mEq total) by mouth daily. 30 tablet 0   rosuvastatin (CRESTOR) 40 MG tablet Take 1 tablet (40 mg total) by mouth daily. 90 tablet 3   No current facility-administered medications for this encounter.   Facility-Administered Medications Ordered in Other Encounters  Medication Dose Route Frequency Provider Last Rate Last Admin   heparin lock flush 100 unit/mL  500 Units Intracatheter Once Serena Croissant, MD       sodium chloride flush (NS) 0.9 % injection 10 mL  10 mL Intracatheter Once Serena Croissant, MD        Allergies  Allergen Reactions   Pollen Extract  Shortness Of Breath   Compazine [Prochlorperazine] Anxiety    IV compazine causes severe anxiety attack   Latex Rash   Codeine Hives   Diflucan [Fluconazole] Hives   Shellfish Allergy Hives   Betadine [Povidone Iodine] Rash      Social History   Socioeconomic History   Marital status: Single    Spouse name: Not on file   Number of children: Not on file   Years of education: Not on file   Highest education level: Not on file  Occupational History   Occupation: Child psychotherapist  Tobacco Use   Smoking status: Never   Smokeless tobacco: Never  Vaping Use   Vaping Use: Never used  Substance and Sexual Activity   Alcohol use: No   Drug use: Not Currently    Frequency: 2.0 times per week  Sexual activity: Not Currently    Birth control/protection: I.U.D.    Comment: Mirena  Other Topics Concern   Not on file  Social History Narrative   Not on file   Social Determinants of Health   Financial Resource Strain: High Risk (03/14/2022)   Overall Financial Resource Strain (CARDIA)    Difficulty of Paying Living Expenses: Hard  Food Insecurity: Not on file  Transportation Needs: Not on file  Physical Activity: Not on file  Stress: Not on file  Social Connections: Not on file  Intimate Partner Violence: Not on file      Family History  Problem Relation Age of Onset   Hypertension Mother    High Cholesterol Mother    Thyroid disease Mother    Cancer Mother    Sleep apnea Mother    Hypertension Father    Heart attack Father    Sudden death Father    Diabetes Maternal Grandmother    Bone cancer Maternal Grandfather        dx after 82   Breast cancer Maternal Aunt        dx 30s   Lung cancer Maternal Aunt        dx after 50    Vitals:   08/21/22 1453  BP: 108/60  Pulse: 76  SpO2: 99%  Weight: 136.1 kg (300 lb)     Wt Readings from Last 3 Encounters:  08/21/22 136.1 kg (300 lb)  08/09/22 (!) 138.9 kg (306 lb 4.8 oz)  07/20/22 (!) 137.7 kg (303 lb 9.6 oz)     PHYSICAL EXAM: General:  Well appearing. No resp difficulty HEENT: normal Neck: supple. no JVD. Carotids 2+ bilat; no bruits. No lymphadenopathy or thryomegaly appreciated. Cor: PMI nondisplaced. Regular rate & rhythm. No rubs, gallops or murmurs. Lungs: clear Abdomen: obese soft, nontender, nondistended. No hepatosplenomegaly. No bruits or masses. Good bowel sounds. Extremities: no cyanosis, clubbing, rash, edema Neuro: alert & orientedx3, cranial nerves grossly intact. moves all 4 extremities w/o difficulty. Affect pleasant  ECG: NSR 75 +LBBB Personally reviewed   ASSESSMENT & PLAN:  1. Right Breast Cancer, stage IV - HER2 + - Treated with lumpectomy in 3/22. Then Taxol/Herceptin 12 rounds from 5/22-8/22. Now on Herceptin q 3 weeks. Also about to finish XRT.  - Echo 10/22 EF 60-65% GLS reported as -13.3%. I have reviewed this and reading is inaccurate due to poor endocardial tracking - Echo  03/21/20 EF 55-60%  Mod LVH G2DD GLS 15.0% - Echo 06/22/21 EF 55% Mild LVH G1DD GLS -19.9% - Finished Herceptin 07/08/21. Found to have recurrent disease in lymph node in her neck. Enhertu started on 08/21/21.  - Echo 09/21/21 EF 60-65% Mild to mod LVH G1DD GLS -15.9% (underestimated) - Personally reviewed - Echo 03/28/21: EF 55-60% mod LVVH G1DD GLS -18.0  - Echo today 08/20/22: EF 60-65% Personally reviewed - Continue echos every 3 months while on EnHertu  2. HTN with LVH on echo  - Blood pressure well controlled. Continue current regimen. - Home sleep study ordered previously. Not completed - can use lasix 20 prn for edema  3. Snoring/daytime somnolence - pending sleep study. Has WatchPat at home. Encouraged to do sleep study when she is up to it  4. Morbid obesity - has enrolled in weight loss clinic - Body mass index is 44.3 kg/m. - Possible GLP1RA candidate down the road  5. Hypokalemia - increase KCL to 40 daily - can consider spironolactone in future  Arvilla Meres, MD  3:09  PM

## 2022-08-26 NOTE — Progress Notes (Signed)
Patient Care Team: [name], MD as PCP - General (Internal Medicine) Mcarthur Rossetti, MD as Consulting Physician (Orthopedic Surgery) Mauro Kaufmann, RN as Oncology Nurse Navigator Rockwell Germany, RN as Oncology Nurse Navigator [name], MD as Consulting Physician (General Surgery) Nicholas Lose, MD as Consulting Physician (Hematology and Oncology) Eppie Gibson, MD as Attending Physician (Radiation Oncology)  DIAGNOSIS: No diagnosis found.  SUMMARY OF ONCOLOGIC HISTORY: Oncology History  Malignant neoplasm of lower-outer quadrant of right breast of female, estrogen receptor positive ([place])  05/03/2020 Initial Diagnosis   Screening mammogram showed a 1.0cm mass at the 6 o'clock position in the right breast. Diagnostic mammogram and US showed the 1.2cm mass at the 6 o'clock position in the right breast. Biopsy showed IDC, grade 3, HER-2 positive (3+), ER 15% weak, PR-, Ki67 80%.   05/19/2020 Genetic Testing   Negative hereditary cancer genetic testing: no pathogenic variants detected in [facility] Breast STAT Panel and Common Hereditary Cancers Panel.  The report dates are May 19, 2020 (STAT) and May 23, 2020 (Common Hereditary).   The Common Hereditary Cancers Panel offered by [facility] includes sequencing and/or deletion duplication testing of the following 47 genes: APC, ATM, AXIN2, BARD1, BMPR1A, BRCA1, BRCA2, BRIP1, CDH1, CDK4, CDKN2A (p14ARF), CDKN2A (p16INK4a), CHEK2, CTNNA1, DICER1, EPCAM (Deletion/duplication testing only), GREM1 (promoter region deletion/duplication testing only), GREM1, HOXB13, KIT, MEN1, MLH1, MSH2, MSH3, MSH6, MUTYH, NBN, NF1, NHTL1, PALB2, PDGFRA, PMS2, POLD1, POLE, PTEN, RAD50, RAD51C, RAD51D, SDHA, SDHB, SDHC, SDHD, SMAD4, SMARCA4. STK11, TP53, TSC1, TSC2, and VHL.  The following genes were evaluated for sequence changes only: SDHA and HOXB13 c.251G>A variant only.es only: SDHA and HOXB13 c.251G>A variant only.   06/02/2020 Surgery   Right  lumpectomy ([facility]): invasive and in situ ductal carcinoma, 1.3cm, clear margins, 1 right axillary lymph node negative for carcinoma.   06/02/2020 Cancer Staging   Staging form: Breast, AJCC 8th Edition - Pathologic stage from 06/02/2020: Stage IA (pT1c, pN0, cM0, G3, ER+, PR-, HER2+) - Signed by Gardenia Phlegm, NP on 06/15/2020 Stage prefix: Initial diagnosis Histologic grading system: 3 grade system   07/08/2020 - 10/21/2020 Chemotherapy   Taxol Herceptin followed by Herceptin maintenance   04/14/2021 -  Anti-estrogen oral therapy   Tamoxifen 10 mg   07/13/2021 Imaging   CT angiogram: Right supraclavicular lymph node 5 cm, right internal mammary lymph nodes 1.8 cm    08/06/2021 PET scan   PET CT scan: Ext Right Supra clav LN along with Internal mammary and Rt Upper Mediastinal and Rt Axillary LN   08/2021 Initial Biopsy   Right supraclavicular lymphadenopathy: Biopsy: Metastatic poorly differentiated adenocarcinoma, ER +40%, PR negative, HER2 positive 3+    08/21/2021 -  Chemotherapy   Enhertu every 3 weeks    03/08/2022 Imaging   CT chest/abdomen/pelvis  1. No new or progressive findings in the chest, abdomen or pelvis. 2. Signs of RIGHT breast lumpectomy with areas of central fat necrosis unchanged from previous imaging. 3. Stable small lymph nodes throughout the chest, largest with area of soft tissue about surgical clips in the RIGHT axilla. 4. Stable appearance of thoracic inlet lymph nodes, not shown to be hypermetabolic on prior PET imaging. 5. IUD in-situ. 6. Aortic atherosclerosis.     CHIEF COMPLIANT:   INTERVAL HISTORY: Pamela [name] is a   ALLERGIES:  is allergic to pollen extract, compazine [prochlorperazine], latex, codeine, diflucan [fluconazole], shellfish allergy, and betadine [povidone iodine].  MEDICATIONS:  Current Outpatient Medications  Medication Sig Dispense Refill  amLODipine (NORVASC) 10 MG tablet Take 1 tablet (10 mg total) by mouth  daily. 90 tablet 2   aspirin EC 81 MG tablet Take 1 tablet (81 mg total) by mouth daily. Swallow whole. 30 tablet 11   azithromycin (ZITHROMAX Z-PAK) 250 MG tablet Take as directed 6 each 0   benzonatate (TESSALON) 100 MG capsule Take 1 capsule (100 mg total) by mouth 3 (three) times daily as needed for cough. 30 capsule 0   cetirizine (ZYRTEC ALLERGY) 10 MG tablet Take 1 tablet (10 mg total) by mouth daily. 30 tablet 2   EDARBYCLOR 40-25 MG TABS Take 1 tablet by mouth daily. 90 tablet 2   furosemide (LASIX) 20 MG tablet Take 1 tablet (20 mg total) by mouth as needed. TAKE 1 TABLET AS NEEDED FOR SWELLING OR SHORTNESS OF BREATH OR WEIGHT GAIN. 3 POUNDS WITHIN 24HR OR 5 POUNDS 7 DAYS 90 tablet 3   levonorgestrel (MIRENA, 52 MG,) 20 MCG/DAY IUD 1 each by Intrauterine route once.     lidocaine-prilocaine (EMLA) cream Apply to affected area once AS DIRECTED 30 g 3   linaclotide (LINZESS) 72 MCG capsule Take 1 capsule (72 mcg total) by mouth daily before breakfast. 30 capsule 0   potassium chloride SA (KLOR-CON M) 10 MEQ tablet Take 4 tablets (40 mEq total) by mouth daily. 120 tablet 3   rosuvastatin (CRESTOR) 40 MG tablet Take 1 tablet (40 mg total) by mouth daily. 90 tablet 3   No current facility-administered medications for this visit.   Facility-Administered Medications Ordered in Other Visits  Medication Dose Route Frequency Provider Last Rate Last Admin   heparin lock flush 100 unit/mL  500 Units Intracatheter Once Serena Croissant, MD       sodium chloride flush (NS) 0.9 % injection 10 mL  10 mL Intracatheter Once Serena Croissant, MD        PHYSICAL EXAMINATION: ECOG PERFORMANCE STATUS: {CHL ONC ECOG ZO:[id number]}  There were no vitals filed for this visit. There were no vitals filed for this visit.  BREAST:*** No palpable masses or nodules in either right or left breasts. No palpable axillary supraclavicular or infraclavicular adenopathy no breast tenderness or nipple discharge. (exam  performed in the presence of a chaperone)  LABORATORY DATA:  I have reviewed the data as listed    Latest Ref Rng & Units 08/09/2022    8:51 AM 07/20/2022    8:59 AM 06/27/2022   10:39 AM  CMP  Glucose 70 - 99 mg/dL 811  96  99   BUN 6 - 20 mg/dL 6  6  7    Creatinine 0.44 - 1.00 mg/dL 9.14  7.82  9.56   Sodium 135 - 145 mmol/L 141  142  142   Potassium 3.5 - 5.1 mmol/L 3.2  3.2  3.3   Chloride 98 - 111 mmol/L 107  107  107   CO2 22 - 32 mmol/L 28  30  30    Calcium 8.9 - 10.3 mg/dL 8.9  8.8  9.5   Total Protein 6.5 - 8.1 g/dL 6.4  6.3  6.6   Total Bilirubin 0.3 - 1.2 mg/dL 1.8  1.6  1.5   Alkaline Phos 38 - 126 U/L 89  93  81   AST 15 - 41 U/L 31  29  31    ALT 0 - 44 U/L 14  13  16      Lab Results  Component Value Date   WBC 2.8 (L) 08/09/2022   HGB 10.4 (  L) 08/09/2022   HCT 31.1 (L) 08/09/2022   MCV 89.4 08/09/2022   PLT 165 08/09/2022   NEUTROABS 1.4 (L) 08/09/2022    ASSESSMENT & PLAN:  No problem-specific Assessment & Plan notes found for this encounter.    No orders of the defined types were placed in this encounter.  The patient has a good understanding of the overall plan. she agrees with it. she will call with any problems that may develop before the next visit here. Total time spent: 30 mins including face to face time and time spent for planning, charting and co-ordination of care   Sherlyn Lick, CMA 08/26/22    I Janan Ridge am acting as a Neurosurgeon for [employer]  ***

## 2022-08-29 MED FILL — Fosaprepitant Dimeglumine For IV Infusion 150 MG (Base Eq): INTRAVENOUS | Qty: 5 | Status: AC

## 2022-08-30 ENCOUNTER — Inpatient Hospital Stay (HOSPITAL_BASED_OUTPATIENT_CLINIC_OR_DEPARTMENT_OTHER): Payer: 59 | Admitting: Hematology and Oncology

## 2022-08-30 ENCOUNTER — Inpatient Hospital Stay: Payer: 59

## 2022-08-30 ENCOUNTER — Other Ambulatory Visit: Payer: Self-pay

## 2022-08-30 VITALS — BP 133/66 | HR 84 | Temp 97.3°F | Resp 18 | Ht 69.0 in | Wt 301.0 lb

## 2022-08-30 DIAGNOSIS — C50919 Malignant neoplasm of unspecified site of unspecified female breast: Secondary | ICD-10-CM

## 2022-08-30 DIAGNOSIS — Z170 Estrogen receptor positive status [ER+]: Secondary | ICD-10-CM

## 2022-08-30 DIAGNOSIS — Z5112 Encounter for antineoplastic immunotherapy: Secondary | ICD-10-CM | POA: Diagnosis not present

## 2022-08-30 DIAGNOSIS — C50511 Malignant neoplasm of lower-outer quadrant of right female breast: Secondary | ICD-10-CM

## 2022-08-30 DIAGNOSIS — R110 Nausea: Secondary | ICD-10-CM

## 2022-08-30 DIAGNOSIS — Z95828 Presence of other vascular implants and grafts: Secondary | ICD-10-CM

## 2022-08-30 LAB — CMP (CANCER CENTER ONLY)
ALT: 10 U/L (ref 0–44)
AST: 24 U/L (ref 15–41)
Albumin: 3.2 g/dL — ABNORMAL LOW (ref 3.5–5.0)
Alkaline Phosphatase: 95 U/L (ref 38–126)
Anion gap: 6 (ref 5–15)
BUN: 9 mg/dL (ref 6–20)
CO2: 29 mmol/L (ref 22–32)
Calcium: 9.1 mg/dL (ref 8.9–10.3)
Chloride: 106 mmol/L (ref 98–111)
Creatinine: 0.68 mg/dL (ref 0.44–1.00)
GFR, Estimated: >60 mL/min (ref >60)
Glucose, Bld: 96 mg/dL (ref 70–99)
Potassium: 3.4 mmol/L — ABNORMAL LOW (ref 3.5–5.1)
Sodium: 141 mmol/L (ref 135–145)
Total Bilirubin: 1.3 mg/dL — ABNORMAL HIGH (ref 0.3–1.2)
Total Protein: 6.5 g/dL (ref 6.5–8.1)

## 2022-08-30 LAB — CBC WITH DIFFERENTIAL (CANCER CENTER ONLY)
Abs Immature Granulocytes: 0 10*3/uL (ref 0.00–0.07)
Basophils Absolute: 0 10*3/uL (ref 0.0–0.1)
Basophils Relative: 1 %
Eosinophils Absolute: 0.1 10*3/uL (ref 0.0–0.5)
Eosinophils Relative: 4 %
HCT: 34.4 % — ABNORMAL LOW (ref 36.0–46.0)
Hemoglobin: 11.2 g/dL — ABNORMAL LOW (ref 12.0–15.0)
Immature Granulocytes: 0 %
Lymphocytes Relative: 30 %
Lymphs Abs: 1 10*3/uL (ref 0.7–4.0)
MCH: 29.6 pg (ref 26.0–34.0)
MCHC: 32.6 g/dL (ref 30.0–36.0)
MCV: 90.8 fL (ref 80.0–100.0)
Monocytes Absolute: 0.6 10*3/uL (ref 0.1–1.0)
Monocytes Relative: 17 %
Neutro Abs: 1.5 10*3/uL — ABNORMAL LOW (ref 1.7–7.7)
Neutrophils Relative %: 48 %
Platelet Count: 211 10*3/uL (ref 150–400)
RBC: 3.79 MIL/uL — ABNORMAL LOW (ref 3.87–5.11)
RDW: 16.4 % — ABNORMAL HIGH (ref 11.5–15.5)
WBC Count: 3.2 10*3/uL — ABNORMAL LOW (ref 4.0–10.5)
nRBC: 0 % (ref 0.0–0.2)

## 2022-08-30 MED ORDER — HEPARIN SOD (PORK) LOCK FLUSH 100 UNIT/ML IV SOLN
500.0000 [IU] | Freq: Once | INTRAVENOUS | Status: AC | PRN
  Administered 2022-08-30: 500 [IU]

## 2022-08-30 MED ORDER — DEXTROSE 5 % IV SOLN: Freq: Once | INTRAVENOUS | Status: AC

## 2022-08-30 MED ORDER — SODIUM CHLORIDE 0.9 % IV SOLN
150.0000 mg | Freq: Once | INTRAVENOUS | Status: AC
  Administered 2022-08-30: 150 mg via INTRAVENOUS
  Filled 2022-08-30: qty 150

## 2022-08-30 MED ORDER — FAM-TRASTUZUMAB DERUXTECAN-NXKI CHEMO 100 MG IV SOLR
300.0000 mg | Freq: Once | INTRAVENOUS | Status: AC
  Administered 2022-08-30: 300 mg via INTRAVENOUS
  Filled 2022-08-30: qty 15

## 2022-08-30 MED ORDER — CYANOCOBALAMIN 1000 MCG/ML IJ SOLN
1000.0000 ug | Freq: Once | INTRAMUSCULAR | Status: AC
  Administered 2022-08-30: 1000 ug via INTRAMUSCULAR
  Filled 2022-08-30: qty 1

## 2022-08-30 MED ORDER — POTASSIUM CHLORIDE 10 MEQ/100ML IV SOLN
10.0000 meq | Freq: Once | INTRAVENOUS | Status: AC
  Administered 2022-08-30: 10 meq via INTRAVENOUS
  Filled 2022-08-30: qty 100

## 2022-08-30 MED ORDER — SODIUM CHLORIDE 0.9% FLUSH
10.0000 mL | INTRAVENOUS | Status: DC | PRN
  Administered 2022-08-30: 10 mL

## 2022-08-30 MED ORDER — ACETAMINOPHEN 325 MG PO TABS
650.0000 mg | ORAL_TABLET | Freq: Once | ORAL | Status: AC
  Administered 2022-08-30: 650 mg via ORAL
  Filled 2022-08-30: qty 2

## 2022-08-30 MED ORDER — PALONOSETRON HCL INJECTION 0.25 MG/5ML
0.2500 mg | Freq: Once | INTRAVENOUS | Status: AC
  Administered 2022-08-30: 0.25 mg via INTRAVENOUS
  Filled 2022-08-30: qty 5

## 2022-08-30 NOTE — Progress Notes (Signed)
Per Dr. Pamelia Hoit - okay to proceed with ANC of 1.5 and to d/c IVF orders under treatment plan. Patient will still receive 10 MEq of potassium.

## 2022-08-30 NOTE — Patient Instructions (Signed)
[facility]   Discharge Instructions: Thank you for choosing [facility] to provide your oncology and hematology care.   If you have a lab appointment with the Cancer Center, please go directly to the Cancer Center and check in at the registration area.   Wear comfortable clothing and clothing appropriate for easy access to any Portacath or PICC line.   We strive to give you quality time with your provider. You may need to reschedule your appointment if you arrive late (15 or more minutes).  Arriving late affects you and other patients whose appointments are after yours.  Also, if you miss three or more appointments without notifying the office, you may be dismissed from the clinic at the provider's discretion.      For prescription refill requests, have your pharmacy contact our office and allow 72 hours for refills to be completed.    Today you received the following chemotherapy and/or immunotherapy agents: Fam-trastuzumab (Enhertu)      To help prevent nausea and vomiting after your treatment, we encourage you to take your nausea medication as directed.  BELOW ARE SYMPTOMS THAT SHOULD BE REPORTED IMMEDIATELY: *FEVER GREATER THAN 100.4 F (38 C) OR HIGHER *CHILLS OR SWEATING *NAUSEA AND VOMITING THAT IS NOT CONTROLLED WITH YOUR NAUSEA MEDICATION *UNUSUAL SHORTNESS OF BREATH *UNUSUAL BRUISING OR BLEEDING *URINARY PROBLEMS (pain or burning when urinating, or frequent urination) *BOWEL PROBLEMS (unusual diarrhea, constipation, pain near the anus) TENDERNESS IN MOUTH AND THROAT WITH OR WITHOUT PRESENCE OF ULCERS (sore throat, sores in mouth, or a toothache) UNUSUAL RASH, SWELLING OR PAIN  UNUSUAL VAGINAL DISCHARGE OR ITCHING   Items with * indicate a potential emergency and should be followed up as soon as possible or go to the Emergency Department if any problems should occur.  Please show the CHEMOTHERAPY ALERT CARD or IMMUNOTHERAPY  ALERT CARD at check-in to the Emergency Department and triage nurse.  Should you have questions after your visit or need to cancel or reschedule your appointment, please contact [facility]  Dept: [phone]  and follow the prompts.  Office hours are 8:00 a.m. to 4:30 p.m. Monday - Friday. Please note that voicemails left after 4:00 p.m. may not be returned until the following business day.  We are closed weekends and major holidays. You have access to a nurse at all times for urgent questions. Please call the main number to the clinic Dept: [phone] and follow the prompts.   For any non-urgent questions, you may also contact your provider using MyChart. We now offer e-Visits for anyone 47 and older to request care online for non-urgent symptoms. For details visit [email or web address].   Also download the MyChart app! Go to the app store, search "MyChart", open the app, select , and log in with your MyChart username and password.

## 2022-08-30 NOTE — Assessment & Plan Note (Signed)
06/02/2020:Right lumpectomy ([facility]): invasive and in situ ductal carcinoma, 1.3cm, clear margins, 1 right axillary lymph node negative for carcinoma. ER 15% weak, PR-,HER-2 positive (3+) Ki67 80%.   Treatment plan: 1.  Adjuvant chemotherapy with Taxol Herceptin followed by Herceptin maintenance.  Stopped after 11 cycles of Taxol Herceptin maintenance completed 07/07/2021  2.  Adjuvant radiation therapy 11/23/2020-12/19/2020 3.  Adjuvant antiestrogen therapy with tamoxifen started October 2022 4.  CT angiogram 07/13/2021: Right supraclavicular lymph node 5 cm, right internal mammary lymph nodes 1.8 cm 5.  Metastatic breast cancer: June 2023:Right supraclavicular lymphadenopathy: Biopsy: Metastatic poorly differentiated adenocarcinoma, ER +40%, PR negative, HER2 positive 3+ PET CT scan 08/06/21: Ext Right Supra clav LN along with Internal mammary and Rt Upper Mediastinal and Rt Axillary LN -----------------------------------------------------------------------------------------------------  Current treatment: Enhertu started on 08/21/2021, (this treatment on hold) today is cycle 18   Enhertu toxicities: 1,.  Severe nausea, requiring IV fluids: Better since we added Emend 2. Chemotherapy-induced anemia: Monitoring closely, hemoglobin 11.1 3.  Hospitalization 11/26/2021-11/28/2021: Dizziness and fatigue, esophageal dysmotility 4.  Hypokalemia and diarrhea: Gets IV fluids on Saturdays 5.  Leukopenia: ANC 1.1: Okay to treat.  Enhertu dose reduced   Echocardiogram has been scheduled for June.     CT chest abdomen pelvis 06/25/2022: Stable exam.  No new or progressive findings. Next CT scans will be done 10/05/2022   Cold symptoms: Patient will take over-the-counter cold medication.  She will call us if she develops fevers. Leg swelling: I discussed with her about holding IV fluids on Saturdays.  She will also try using compression socks.   Return to clinic in 3 weeks

## 2022-09-01 ENCOUNTER — Ambulatory Visit: Payer: 59

## 2022-09-18 NOTE — Progress Notes (Signed)
Patient Care Team: Dorothyann Peng, MD as PCP - General (Internal Medicine) Kathryne Hitch, MD as Consulting Physician (Orthopedic Surgery) Pershing Proud, RN as Oncology Nurse Navigator Donnelly Angelica, RN as Oncology Nurse Navigator Harriette Bouillon, MD as Consulting Physician (General Surgery) Serena Croissant, MD as Consulting Physician (Hematology and Oncology) Lonie Peak, MD as Attending Physician (Radiation Oncology)  DIAGNOSIS:  Encounter Diagnosis  Name Primary?   Malignant neoplasm of lower-outer quadrant of right breast of female, estrogen receptor positive (HCC) Yes    SUMMARY OF ONCOLOGIC HISTORY: Oncology History  Malignant neoplasm of lower-outer quadrant of right breast of female, estrogen receptor positive (HCC)  05/03/2020 Initial Diagnosis   Screening mammogram showed a 1.0cm mass at the 6 o'clock position in the right breast. Diagnostic mammogram and US showed the 1.2cm mass at the 6 o'clock position in the right breast. Biopsy showed IDC, grade 3, HER-2 positive (3+), ER 15% weak, PR-, Ki67 80%.   05/19/2020 Genetic Testing   Negative hereditary cancer genetic testing: no pathogenic variants detected in [facility] Breast STAT Panel and Common Hereditary Cancers Panel.  The report dates are May 19, 2020 (STAT) and May 23, 2020 (Common Hereditary).   The Common Hereditary Cancers Panel offered by [facility] includes sequencing and/or deletion duplication testing of the following 47 genes: APC, ATM, AXIN2, BARD1, BMPR1A, BRCA1, BRCA2, BRIP1, CDH1, CDK4, CDKN2A (p14ARF), CDKN2A (p16INK4a), CHEK2, CTNNA1, DICER1, EPCAM (Deletion/duplication testing only), GREM1 (promoter region deletion/duplication testing only), GREM1, HOXB13, KIT, MEN1, MLH1, MSH2, MSH3, MSH6, MUTYH, NBN, NF1, NHTL1, PALB2, PDGFRA, PMS2, POLD1, POLE, PTEN, RAD50, RAD51C, RAD51D, SDHA, SDHB, SDHC, SDHD, SMAD4, SMARCA4. STK11, TP53, TSC1, TSC2, and VHL.  The following genes were evaluated for sequence  changes only: SDHA and HOXB13 c.251G>A variant only.es only: SDHA and HOXB13 c.251G>A variant only.   06/02/2020 Surgery   Right lumpectomy ([facility]): invasive and in situ ductal carcinoma, 1.3cm, clear margins, 1 right axillary lymph node negative for carcinoma.   06/02/2020 Cancer Staging   Staging form: Breast, AJCC 8th Edition - Pathologic stage from 06/02/2020: Stage IA (pT1c, pN0, cM0, G3, ER+, PR-, HER2+) - Signed by Loa Socks, NP on 06/15/2020 Stage prefix: Initial diagnosis Histologic grading system: 3 grade system   07/08/2020 - 10/21/2020 Chemotherapy   Taxol Herceptin followed by Herceptin maintenance   04/14/2021 -  Anti-estrogen oral therapy   Tamoxifen 10 mg   07/13/2021 Imaging   CT angiogram: Right supraclavicular lymph node 5 cm, right internal mammary lymph nodes 1.8 cm    08/06/2021 PET scan   PET CT scan: Ext Right Supra clav LN along with Internal mammary and Rt Upper Mediastinal and Rt Axillary LN   08/2021 Initial Biopsy   Right supraclavicular lymphadenopathy: Biopsy: Metastatic poorly differentiated adenocarcinoma, ER +40%, PR negative, HER2 positive 3+    08/21/2021 -  Chemotherapy   Enhertu every 3 weeks    03/08/2022 Imaging   CT chest/abdomen/pelvis  1. No new or progressive findings in the chest, abdomen or pelvis. 2. Signs of RIGHT breast lumpectomy with areas of central fat necrosis unchanged from previous imaging. 3. Stable small lymph nodes throughout the chest, largest with area of soft tissue about surgical clips in the RIGHT axilla. 4. Stable appearance of thoracic inlet lymph nodes, not shown to be hypermetabolic on prior PET imaging. 5. IUD in-situ. 6. Aortic atherosclerosis.     CHIEF COMPLIANT: Cycle 19 Enhertu   INTERVAL HISTORY: [name] is a 57 y.o. with the above-mentioned metastatic breast  cancer currently on treatment with Enhertu. She presents to the clinic for a follow-up and treatment. Pt reports feet is still  swollen. Fatigue has improved. Bowels are doing better. Numbness and tingling in fingers and toes is still there, but nothing to complain about.   ALLERGIES:  is allergic to pollen extract, compazine [prochlorperazine], latex, codeine, diflucan [fluconazole], shellfish allergy, and betadine [povidone iodine].  MEDICATIONS:  Current Outpatient Medications  Medication Sig Dispense Refill   amLODipine (NORVASC) 10 MG tablet Take 1 tablet (10 mg total) by mouth daily. 90 tablet 2   aspirin EC 81 MG tablet Take 1 tablet (81 mg total) by mouth daily. Swallow whole. 30 tablet 11   azithromycin (ZITHROMAX Z-PAK) 250 MG tablet Take as directed 6 each 0   benzonatate (TESSALON) 100 MG capsule Take 1 capsule (100 mg total) by mouth 3 (three) times daily as needed for cough. 30 capsule 0   cetirizine (ZYRTEC ALLERGY) 10 MG tablet Take 1 tablet (10 mg total) by mouth daily. 30 tablet 2   EDARBYCLOR 40-25 MG TABS Take 1 tablet by mouth daily. 90 tablet 2   furosemide (LASIX) 20 MG tablet Take 1 tablet (20 mg total) by mouth as needed. TAKE 1 TABLET AS NEEDED FOR SWELLING OR SHORTNESS OF BREATH OR WEIGHT GAIN. 3 POUNDS WITHIN 24HR OR 5 POUNDS 7 DAYS 90 tablet 3   levonorgestrel (MIRENA, 52 MG,) 20 MCG/DAY IUD 1 each by Intrauterine route once.     lidocaine-prilocaine (EMLA) cream Apply to affected area once AS DIRECTED 30 g 3   linaclotide (LINZESS) 72 MCG capsule Take 1 capsule (72 mcg total) by mouth daily before breakfast. 30 capsule 0   potassium chloride SA (KLOR-CON M) 10 MEQ tablet Take 4 tablets (40 mEq total) by mouth daily. 120 tablet 3   rosuvastatin (CRESTOR) 40 MG tablet Take 1 tablet (40 mg total) by mouth daily. 90 tablet 3   No current facility-administered medications for this visit.   Facility-Administered Medications Ordered in Other Visits  Medication Dose Route Frequency Provider Last Rate Last Admin   heparin lock flush 100 unit/mL  500 Units Intracatheter Once Serena Croissant, MD        sodium chloride flush (NS) 0.9 % injection 10 mL  10 mL Intracatheter Once Serena Croissant, MD        PHYSICAL EXAMINATION: ECOG PERFORMANCE STATUS: 1 - Symptomatic but completely ambulatory  Vitals:   09/20/22 0836  BP: 137/70  Pulse: 73  Resp: 18  Temp: (!) 97.5 F (36.4 C)  SpO2: 100%   Filed Weights   09/20/22 0836  Weight: (!) 306 lb 11.2 oz (139.1 kg)      LABORATORY DATA:  I have reviewed the data as listed    Latest Ref Rng & Units 08/30/2022    9:15 AM 08/09/2022    8:51 AM 07/20/2022    8:59 AM  CMP  Glucose 70 - 99 mg/dL 96  454  96   BUN 6 - 20 mg/dL 9  6  6    Creatinine 0.44 - 1.00 mg/dL 0.98  1.19  1.47   Sodium 135 - 145 mmol/L 141  141  142   Potassium 3.5 - 5.1 mmol/L 3.4  3.2  3.2   Chloride 98 - 111 mmol/L 106  107  107   CO2 22 - 32 mmol/L 29  28  30    Calcium 8.9 - 10.3 mg/dL 9.1  8.9  8.8   Total Protein 6.5 - 8.1  g/dL 6.5  6.4  6.3   Total Bilirubin 0.3 - 1.2 mg/dL 1.3  1.8  1.6   Alkaline Phos 38 - 126 U/L 95  89  93   AST 15 - 41 U/L 24  31  29    ALT 0 - 44 U/L 10  14  13      Lab Results  Component Value Date   WBC 3.2 (L) 09/20/2022   HGB 10.9 (L) 09/20/2022   HCT 33.3 (L) 09/20/2022   MCV 90.5 09/20/2022   PLT 159 09/20/2022   NEUTROABS 1.6 (L) 09/20/2022    ASSESSMENT & PLAN:  Malignant neoplasm of lower-outer quadrant of right breast of female, estrogen receptor positive (HCC) 06/02/2020:Right lumpectomy ([facility]): invasive and in situ ductal carcinoma, 1.3cm, clear margins, 1 right axillary lymph node negative for carcinoma. ER 15% weak, PR-,HER-2 positive (3+) Ki67 80%.   Treatment plan: 1.  Adjuvant chemotherapy with Taxol Herceptin followed by Herceptin maintenance.  Stopped after 11 cycles of Taxol Herceptin maintenance completed 07/07/2021  2.  Adjuvant radiation therapy 11/23/2020-12/19/2020 3.  Adjuvant antiestrogen therapy with tamoxifen started October 2022 4.  CT angiogram 07/13/2021: Right supraclavicular lymph node 5 cm,  right internal mammary lymph nodes 1.8 cm 5.  Metastatic breast cancer: June 2023:Right supraclavicular lymphadenopathy: Biopsy: Metastatic poorly differentiated adenocarcinoma, ER +40%, PR negative, HER2 positive 3+ PET CT scan 08/06/21: Ext Right Supra clav LN along with Internal mammary and Rt Upper Mediastinal and Rt Axillary LN -----------------------------------------------------------------------------------------------------  Current treatment: Enhertu started on 08/21/2021, (this treatment on hold) today is cycle 19   Enhertu toxicities: 1,.  Severe nausea, requiring IV fluids: Better since we added Emend 2. Chemotherapy-induced anemia: Monitoring closely, hemoglobin 11.1 3.  Hospitalization 11/26/2021-11/28/2021: Dizziness and fatigue, esophageal dysmotility 4.  Hypokalemia and diarrhea: Discontinued IV fluids because it was causing her leg swelling 5.  Leukopenia: ANC 1.5: Okay to treat.  Enhertu dose reduced previously   Echocardiogram has been scheduled for June.     CT chest abdomen pelvis 06/25/2022: Stable exam.  No new or progressive findings. Next CT scans will be done 10/04/2022 Based on the scan report we may have to decide on whether to stop Enhertu and switch to antiestrogen therapy like tamoxifen (patient had a menstrual cycle last week) plus or minus anti-HER2 oral therapy like lapatinib.   Leg swelling: On and off   Return to clinic in 3 weeks     No orders of the defined types were placed in this encounter.  The patient has a good understanding of the overall plan. she agrees with it. she will call with any problems that may develop before the next visit here. Total time spent: 30 mins including face to face time and time spent for planning, charting and co-ordination of care   Tamsen Meek, MD 09/20/22    I Janan Ridge am acting as a Neurosurgeon for [employer]  I have reviewed the above documentation for accuracy and completeness, and I agree with the  above.

## 2022-09-19 MED FILL — Fosaprepitant Dimeglumine For IV Infusion 150 MG (Base Eq): INTRAVENOUS | Qty: 5 | Status: AC

## 2022-09-20 ENCOUNTER — Inpatient Hospital Stay: Payer: 59

## 2022-09-20 ENCOUNTER — Inpatient Hospital Stay: Payer: 59 | Attending: Hematology and Oncology | Admitting: Hematology and Oncology

## 2022-09-20 ENCOUNTER — Other Ambulatory Visit: Payer: Self-pay

## 2022-09-20 VITALS — BP 137/70 | HR 73 | Temp 97.5°F | Resp 18 | Ht 69.0 in | Wt 306.7 lb

## 2022-09-20 DIAGNOSIS — Z95828 Presence of other vascular implants and grafts: Secondary | ICD-10-CM

## 2022-09-20 DIAGNOSIS — R5383 Other fatigue: Secondary | ICD-10-CM | POA: Insufficient documentation

## 2022-09-20 DIAGNOSIS — C50511 Malignant neoplasm of lower-outer quadrant of right female breast: Secondary | ICD-10-CM

## 2022-09-20 DIAGNOSIS — Z170 Estrogen receptor positive status [ER+]: Secondary | ICD-10-CM | POA: Insufficient documentation

## 2022-09-20 DIAGNOSIS — T451X5A Adverse effect of antineoplastic and immunosuppressive drugs, initial encounter: Secondary | ICD-10-CM | POA: Diagnosis not present

## 2022-09-20 DIAGNOSIS — Z885 Allergy status to narcotic agent status: Secondary | ICD-10-CM | POA: Diagnosis not present

## 2022-09-20 DIAGNOSIS — D72819 Decreased white blood cell count, unspecified: Secondary | ICD-10-CM | POA: Diagnosis not present

## 2022-09-20 DIAGNOSIS — Z883 Allergy status to other anti-infective agents status: Secondary | ICD-10-CM | POA: Insufficient documentation

## 2022-09-20 DIAGNOSIS — R110 Nausea: Secondary | ICD-10-CM

## 2022-09-20 DIAGNOSIS — E876 Hypokalemia: Secondary | ICD-10-CM | POA: Insufficient documentation

## 2022-09-20 DIAGNOSIS — R202 Paresthesia of skin: Secondary | ICD-10-CM | POA: Diagnosis not present

## 2022-09-20 DIAGNOSIS — Z5112 Encounter for antineoplastic immunotherapy: Secondary | ICD-10-CM | POA: Diagnosis present

## 2022-09-20 DIAGNOSIS — R200 Anesthesia of skin: Secondary | ICD-10-CM | POA: Diagnosis not present

## 2022-09-20 DIAGNOSIS — Z888 Allergy status to other drugs, medicaments and biological substances status: Secondary | ICD-10-CM | POA: Diagnosis not present

## 2022-09-20 DIAGNOSIS — R197 Diarrhea, unspecified: Secondary | ICD-10-CM | POA: Diagnosis not present

## 2022-09-20 DIAGNOSIS — M7989 Other specified soft tissue disorders: Secondary | ICD-10-CM | POA: Diagnosis not present

## 2022-09-20 DIAGNOSIS — I700 Atherosclerosis of aorta: Secondary | ICD-10-CM | POA: Diagnosis not present

## 2022-09-20 DIAGNOSIS — Z79899 Other long term (current) drug therapy: Secondary | ICD-10-CM | POA: Diagnosis not present

## 2022-09-20 DIAGNOSIS — C778 Secondary and unspecified malignant neoplasm of lymph nodes of multiple regions: Secondary | ICD-10-CM | POA: Diagnosis not present

## 2022-09-20 DIAGNOSIS — D6481 Anemia due to antineoplastic chemotherapy: Secondary | ICD-10-CM | POA: Diagnosis not present

## 2022-09-20 DIAGNOSIS — C50919 Malignant neoplasm of unspecified site of unspecified female breast: Secondary | ICD-10-CM

## 2022-09-20 LAB — CMP (CANCER CENTER ONLY)
ALT: 21 U/L (ref 0–44)
AST: 36 U/L (ref 15–41)
Albumin: 3.2 g/dL — ABNORMAL LOW (ref 3.5–5.0)
Alkaline Phosphatase: 100 U/L (ref 38–126)
Anion gap: 4 — ABNORMAL LOW (ref 5–15)
BUN: 8 mg/dL (ref 6–20)
CO2: 29 mmol/L (ref 22–32)
Calcium: 9 mg/dL (ref 8.9–10.3)
Chloride: 109 mmol/L (ref 98–111)
Creatinine: 0.68 mg/dL (ref 0.44–1.00)
GFR, Estimated: >60 mL/min (ref >60)
Glucose, Bld: 90 mg/dL (ref 70–99)
Potassium: 3.4 mmol/L — ABNORMAL LOW (ref 3.5–5.1)
Sodium: 142 mmol/L (ref 135–145)
Total Bilirubin: 1.4 mg/dL — ABNORMAL HIGH (ref 0.3–1.2)
Total Protein: 6.2 g/dL — ABNORMAL LOW (ref 6.5–8.1)

## 2022-09-20 LAB — CBC WITH DIFFERENTIAL (CANCER CENTER ONLY)
Abs Immature Granulocytes: 0.01 10*3/uL (ref 0.00–0.07)
Basophils Absolute: 0 10*3/uL (ref 0.0–0.1)
Basophils Relative: 1 %
Eosinophils Absolute: 0.2 10*3/uL (ref 0.0–0.5)
Eosinophils Relative: 5 %
HCT: 33.3 % — ABNORMAL LOW (ref 36.0–46.0)
Hemoglobin: 10.9 g/dL — ABNORMAL LOW (ref 12.0–15.0)
Immature Granulocytes: 0 %
Lymphocytes Relative: 28 %
Lymphs Abs: 0.9 10*3/uL (ref 0.7–4.0)
MCH: 29.6 pg (ref 26.0–34.0)
MCHC: 32.7 g/dL (ref 30.0–36.0)
MCV: 90.5 fL (ref 80.0–100.0)
Monocytes Absolute: 0.5 10*3/uL (ref 0.1–1.0)
Monocytes Relative: 16 %
Neutro Abs: 1.6 10*3/uL — ABNORMAL LOW (ref 1.7–7.7)
Neutrophils Relative %: 50 %
Platelet Count: 159 10*3/uL (ref 150–400)
RBC: 3.68 MIL/uL — ABNORMAL LOW (ref 3.87–5.11)
RDW: 16 % — ABNORMAL HIGH (ref 11.5–15.5)
WBC Count: 3.2 10*3/uL — ABNORMAL LOW (ref 4.0–10.5)
nRBC: 0 % (ref 0.0–0.2)

## 2022-09-20 MED ORDER — ACETAMINOPHEN 325 MG PO TABS
650.0000 mg | ORAL_TABLET | Freq: Once | ORAL | Status: AC
  Administered 2022-09-20: 650 mg via ORAL
  Filled 2022-09-20: qty 2

## 2022-09-20 MED ORDER — HEPARIN SOD (PORK) LOCK FLUSH 100 UNIT/ML IV SOLN
500.0000 [IU] | Freq: Once | INTRAVENOUS | Status: AC | PRN
  Administered 2022-09-20: 500 [IU]

## 2022-09-20 MED ORDER — SODIUM CHLORIDE 0.9 % IV SOLN: Freq: Once | INTRAVENOUS | Status: AC

## 2022-09-20 MED ORDER — SODIUM CHLORIDE 0.9% FLUSH
10.0000 mL | Freq: Once | INTRAVENOUS | Status: AC
  Administered 2022-09-20: 10 mL

## 2022-09-20 MED ORDER — DEXTROSE 5 % IV SOLN: Freq: Once | INTRAVENOUS | Status: AC

## 2022-09-20 MED ORDER — POTASSIUM CHLORIDE 10 MEQ/100ML IV SOLN
10.0000 meq | Freq: Once | INTRAVENOUS | Status: AC
  Administered 2022-09-20: 10 meq via INTRAVENOUS
  Filled 2022-09-20: qty 100

## 2022-09-20 MED ORDER — PALONOSETRON HCL INJECTION 0.25 MG/5ML
0.2500 mg | Freq: Once | INTRAVENOUS | Status: AC
  Administered 2022-09-20: 0.25 mg via INTRAVENOUS
  Filled 2022-09-20: qty 5

## 2022-09-20 MED ORDER — SODIUM CHLORIDE 0.9% FLUSH
10.0000 mL | INTRAVENOUS | Status: DC | PRN
  Administered 2022-09-20: 10 mL

## 2022-09-20 MED ORDER — SODIUM CHLORIDE 0.9 % IV SOLN
150.0000 mg | Freq: Once | INTRAVENOUS | Status: AC
  Administered 2022-09-20: 150 mg via INTRAVENOUS
  Filled 2022-09-20: qty 150

## 2022-09-20 MED ORDER — FAM-TRASTUZUMAB DERUXTECAN-NXKI CHEMO 100 MG IV SOLR
300.0000 mg | Freq: Once | INTRAVENOUS | Status: AC
  Administered 2022-09-20: 300 mg via INTRAVENOUS
  Filled 2022-09-20: qty 15

## 2022-09-20 NOTE — Patient Instructions (Addendum)
[name]  Discharge Instructions: Thank you for choosing [facility] to provide your oncology and hematology care.   If you have a lab appointment with the Cancer Center, please go directly to the Cancer Center and check in at the registration area.   Wear comfortable clothing and clothing appropriate for easy access to any Portacath or PICC line.   We strive to give you quality time with your provider. You may need to reschedule your appointment if you arrive late (15 or more minutes).  Arriving late affects you and other patients whose appointments are after yours.  Also, if you miss three or more appointments without notifying the office, you may be dismissed from the clinic at the provider's discretion.      For prescription refill requests, have your pharmacy contact our office and allow 72 hours for refills to be completed.    Today you received the following chemotherapy and/or immunotherapy agents :  Fam-trastuzumab.   To help prevent nausea and vomiting after your treatment, we encourage you to take your nausea medication as directed.  BELOW ARE SYMPTOMS THAT SHOULD BE REPORTED IMMEDIATELY: *FEVER GREATER THAN 100.4 F (38 C) OR HIGHER *CHILLS OR SWEATING *NAUSEA AND VOMITING THAT IS NOT CONTROLLED WITH YOUR NAUSEA MEDICATION *UNUSUAL SHORTNESS OF BREATH *UNUSUAL BRUISING OR BLEEDING *URINARY PROBLEMS (pain or burning when urinating, or frequent urination) *BOWEL PROBLEMS (unusual diarrhea, constipation, pain near the anus) TENDERNESS IN MOUTH AND THROAT WITH OR WITHOUT PRESENCE OF ULCERS (sore throat, sores in mouth, or a toothache) UNUSUAL RASH, SWELLING OR PAIN  UNUSUAL VAGINAL DISCHARGE OR ITCHING   Items with * indicate a potential emergency and should be followed up as soon as possible or go to the Emergency Department if any problems should occur.  Please show the CHEMOTHERAPY ALERT CARD or IMMUNOTHERAPY ALERT CARD at  check-in to the Emergency Department and triage nurse.  Should you have questions after your visit or need to cancel or reschedule your appointment, please contact [place] [facility]  Dept: [phone]  and follow the prompts.  Office hours are 8:00 a.m. to 4:30 p.m. Monday - Friday. Please note that voicemails left after 4:00 p.m. may not be returned until the following business day.  We are closed weekends and major holidays. You have access to a nurse at all times for urgent questions. Please call the main number to the clinic Dept: [phone] and follow the prompts.   For any non-urgent questions, you may also contact your provider using MyChart. We now offer e-Visits for anyone 39 and older to request care online for non-urgent symptoms. For details visit [email or web address].   Also download the MyChart app! Go to the app store, search "MyChart", open the app, select , and log in with your MyChart username and password.

## 2022-09-20 NOTE — Assessment & Plan Note (Addendum)
06/02/2020:Right lumpectomy ([facility]): invasive and in situ ductal carcinoma, 1.3cm, clear margins, 1 right axillary lymph node negative for carcinoma. ER 15% weak, PR-,HER-2 positive (3+) Ki67 80%.   Treatment plan: 1.  Adjuvant chemotherapy with Taxol Herceptin followed by Herceptin maintenance.  Stopped after 11 cycles of Taxol Herceptin maintenance completed 07/07/2021  2.  Adjuvant radiation therapy 11/23/2020-12/19/2020 3.  Adjuvant antiestrogen therapy with tamoxifen started October 2022 4.  CT angiogram 07/13/2021: Right supraclavicular lymph node 5 cm, right internal mammary lymph nodes 1.8 cm 5.  Metastatic breast cancer: June 2023:Right supraclavicular lymphadenopathy: Biopsy: Metastatic poorly differentiated adenocarcinoma, ER +40%, PR negative, HER2 positive 3+ PET CT scan 08/06/21: Ext Right Supra clav LN along with Internal mammary and Rt Upper Mediastinal and Rt Axillary LN -----------------------------------------------------------------------------------------------------  Current treatment: Enhertu started on 08/21/2021, (this treatment on hold) today is cycle 19   Enhertu toxicities: 1,.  Severe nausea, requiring IV fluids: Better since we added Emend 2. Chemotherapy-induced anemia: Monitoring closely, hemoglobin 11.1 3.  Hospitalization 11/26/2021-11/28/2021: Dizziness and fatigue, esophageal dysmotility 4.  Hypokalemia and diarrhea: Discontinued IV fluids because it was causing her leg swelling 5.  Leukopenia: ANC 1.5: Okay to treat.  Enhertu dose reduced previously   Echocardiogram has been scheduled for June.     CT chest abdomen pelvis 06/25/2022: Stable exam.  No new or progressive findings. Next CT scans will be done 10/04/2022 Based on the scan report we may have to decide on whether to stop Enhertu and switch to antiestrogen therapy like tamoxifen (patient had a menstrual cycle last week) plus or minus anti-HER2 oral therapy like lapatinib.   Leg swelling: On and off   Return  to clinic in 3 weeks

## 2022-09-27 ENCOUNTER — Telehealth: Payer: Self-pay | Admitting: Hematology and Oncology

## 2022-09-27 NOTE — Telephone Encounter (Signed)
Scheduled appointments per WQ. Patient is aware of the made appointments.  

## 2022-10-03 ENCOUNTER — Telehealth: Payer: Self-pay

## 2022-10-03 ENCOUNTER — Other Ambulatory Visit: Payer: Self-pay | Admitting: Hematology and Oncology

## 2022-10-03 NOTE — Telephone Encounter (Signed)
Attempted return call to Pt. Pt states she tried to call Monday and never got a call back. Pt left call back number but no other information. Calls to number does not go through and does not allow for voicemail. Called Pt's mother and gave above message, mother states she will relay message to Pt.

## 2022-10-04 ENCOUNTER — Ambulatory Visit (HOSPITAL_COMMUNITY)
Admission: RE | Admit: 2022-10-04 | Discharge: 2022-10-04 | Disposition: A | Discharge status: Home / Self Care | Payer: 59 | Source: Ambulatory Visit | Attending: Hematology and Oncology | Admitting: Hematology and Oncology

## 2022-10-04 ENCOUNTER — Encounter (HOSPITAL_COMMUNITY): Payer: Self-pay

## 2022-10-04 DIAGNOSIS — C50511 Malignant neoplasm of lower-outer quadrant of right female breast: Secondary | ICD-10-CM | POA: Insufficient documentation

## 2022-10-04 DIAGNOSIS — Z170 Estrogen receptor positive status [ER+]: Secondary | ICD-10-CM | POA: Diagnosis present

## 2022-10-04 MED ORDER — HEPARIN SOD (PORK) LOCK FLUSH 100 UNIT/ML IV SOLN
INTRAVENOUS | Status: AC
  Filled 2022-10-04: qty 5

## 2022-10-04 MED ORDER — IOHEXOL 300 MG/ML  SOLN
100.0000 mL | Freq: Once | INTRAMUSCULAR | Status: AC | PRN
  Administered 2022-10-04: 100 mL via INTRAVENOUS

## 2022-10-04 MED ORDER — HEPARIN SOD (PORK) LOCK FLUSH 100 UNIT/ML IV SOLN
500.0000 [IU] | Freq: Once | INTRAVENOUS | Status: AC
  Administered 2022-10-04: 500 [IU] via INTRAVENOUS

## 2022-10-10 MED FILL — Fosaprepitant Dimeglumine For IV Infusion 150 MG (Base Eq): INTRAVENOUS | Qty: 5 | Status: AC

## 2022-10-11 ENCOUNTER — Inpatient Hospital Stay: Payer: 59

## 2022-10-11 ENCOUNTER — Telehealth: Payer: Self-pay | Admitting: Hematology and Oncology

## 2022-10-11 ENCOUNTER — Inpatient Hospital Stay: Payer: 59 | Admitting: Hematology and Oncology

## 2022-10-11 ENCOUNTER — Telehealth: Payer: Self-pay

## 2022-10-11 NOTE — Assessment & Plan Note (Deleted)
06/02/2020:Right lumpectomy ([facility]): invasive and in situ ductal carcinoma, 1.3cm, clear margins, 1 right axillary lymph node negative for carcinoma. ER 15% weak, PR-,HER-2 positive (3+) Ki67 80%.   Treatment plan: 1.  Adjuvant chemotherapy with Taxol Herceptin followed by Herceptin maintenance.  Stopped after 11 cycles of Taxol Herceptin maintenance completed 07/07/2021  2.  Adjuvant radiation therapy 11/23/2020-12/19/2020 3.  Adjuvant antiestrogen therapy with tamoxifen started October 2022 4.  CT angiogram 07/13/2021: Right supraclavicular lymph node 5 cm, right internal mammary lymph nodes 1.8 cm 5.  Metastatic breast cancer: June 2023:Right supraclavicular lymphadenopathy: Biopsy: Metastatic poorly differentiated adenocarcinoma, ER +40%, PR negative, HER2 positive 3+ PET CT scan 08/06/21: Ext Right Supra clav LN along with Internal mammary and Rt Upper Mediastinal and Rt Axillary LN -----------------------------------------------------------------------------------------------------  Current treatment: Enhertu started on 08/21/2021, (this treatment on hold) today is cycle 19   Enhertu toxicities: 1,.  Severe nausea, requiring IV fluids: Better since we added Emend 2. Chemotherapy-induced anemia: Monitoring closely, hemoglobin 11.1 3.  Hospitalization 11/26/2021-11/28/2021: Dizziness and fatigue, esophageal dysmotility 4.  Hypokalemia and diarrhea: Discontinued IV fluids because it was causing her leg swelling 5.  Leukopenia: ANC 1.5: Okay to treat.  Enhertu dose reduced previously   Echocardiogram has been scheduled for June.     CT chest abdomen pelvis 06/25/2022: Stable exam.  No new or progressive findings. CT CAP 10/05/2022: Stable without new or progressive findings.  Stable tiny bilateral thoracic inlet lymph nodes  Based on the scan report we may have to decide on whether to stop Enhertu and switch to antiestrogen therapy like tamoxifen (patient had a menstrual cycle last week) plus or minus  anti-HER2 oral therapy like lapatinib.   Leg swelling: On and off   Return to clinic in 3 weeks
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
## 2022-10-11 NOTE — Telephone Encounter (Signed)
Patient has been made aware of rescheduled appointment dates/times

## 2022-10-12 ENCOUNTER — Other Ambulatory Visit (HOSPITAL_COMMUNITY): Payer: Self-pay

## 2022-10-12 ENCOUNTER — Other Ambulatory Visit: Payer: Self-pay | Admitting: Internal Medicine

## 2022-10-18 MED FILL — Fosaprepitant Dimeglumine For IV Infusion 150 MG (Base Eq): INTRAVENOUS | Qty: 5 | Status: AC

## 2022-10-18 NOTE — Progress Notes (Signed)
Patient Care Team: Pamela Peng, MD as PCP - General (Internal Medicine) Pamela Hitch, MD as Consulting Physician (Orthopedic Surgery) Pamela Proud, RN as Oncology Nurse Navigator Pamela Angelica, RN as Oncology Nurse Navigator Pamela Bouillon, MD as Consulting Physician (General Surgery) Pamela Croissant, MD as Consulting Physician (Hematology and Oncology) Pamela Peak, MD as Attending Physician (Radiation Oncology)  Clinic Day:  10/19/2022  Referring physician: Serena Croissant, MD  ASSESSMENT & PLAN:   Assessment & Plan: Malignant neoplasm of lower-outer quadrant of right breast of female, estrogen receptor positive (HCC) 06/02/2020:Right lumpectomy ([facility]): invasive and in situ ductal carcinoma, 1.3cm, clear margins, 1 right axillary lymph node negative for carcinoma. ER 15% weak, PR-,HER-2 positive (3+) Ki67 80%.   Treatment plan: 1.  Adjuvant chemotherapy with Taxol Herceptin followed by Herceptin maintenance.  Stopped after 11 cycles of Taxol Herceptin maintenance completed 07/07/2021  2.  Adjuvant radiation therapy 11/23/2020-12/19/2020 3.  Adjuvant antiestrogen therapy with tamoxifen started October 2022 4.  CT angiogram 07/13/2021: Right supraclavicular lymph node 5 cm, right internal mammary lymph nodes 1.8 cm 5.  Metastatic breast cancer: June 2023:Right supraclavicular lymphadenopathy: Biopsy: Metastatic poorly differentiated adenocarcinoma, ER +40%, PR negative, HER2 positive 3+ PET CT scan 08/06/21: Ext Right Supra clav LN along with Internal mammary and Rt Upper Mediastinal and Rt Axillary LN -----------------------------------------------------------------------------------------------------  Current treatment: Enhertu started on 08/21/2021, (this treatment on hold) today is cycle 19   Enhertu toxicities: 1,.  Severe nausea, requiring IV fluids: Better since we added Emend 2. Chemotherapy-induced anemia: Monitoring closely, hemoglobin 11.1 3.  Hospitalization  11/26/2021-11/28/2021: Dizziness and fatigue, esophageal dysmotility 4.  Hypokalemia and diarrhea: Discontinued IV fluids because it was causing her leg swelling 5.  Leukopenia: ANC 1.5: Okay to treat.  Enhertu dose reduced previously   Echocardiogram done 08/21/2022. She has normal EF at 60-65%. There is no ventricular wall motion abnormality. There is no left ventricular diastolic dysfunction. She has mild left ventricular hypertrophy.    CT chest abdomen pelvis 06/25/2022: Stable exam.  No new or progressive findings. CT Chest, abdomen, and pelvis done 10/05/2022 with the following results -  IMPRESSION: 1. Stable examination without new or progressive finding in the chest, abdomen or pelvis to suggest recurrent or metastatic disease. 2. Stable tiny bilateral thoracic inlet lymph nodes. 3.  Aortic Atherosclerosis (ICD10-I70.0).  10/19/2022 - plant to proceed with cycle 16 day 1 Enhertu.   She would like to discuss whether to stop Enhertu and switch to antiestrogen therapy like tamoxifen (patient had a menstrual cycle last week) plus or minus anti-HER2 oral therapy like lapatinib.  Return to clinic in 3 weeks. -labs/flush with follow up and infusion already scheduled with Dr. Georgiann Mohs.   Plan Review CT scan results with the patient  Review echocardiogram results with the patient.  Review labs  -CBC showing WBC 3.1; Hgb 11.1; Hct 33.9; Plt 190, ANC 1.3 -CMP showing K 3.4; Ca 8.6; t. Bili 1.4; normal LFTs; normal renal functions.  Per Dr. Georgiann Mohs, ok to proceed to treatment Cycle 16 day 1 Labs/flush, follow up, and treatment with Dr. Georgiann Mohs as scheduled.   Of note - She is currently scheduled for screening colonoscopy on 10/25/2022. She voiced desire to postpone this procedure while receiving Enhertu treatment. Dr. Georgiann Mohs did say she could delay the procedure. The patient states that she will talk to him further at their next visit, scheduled for 11/07/2022.  The patient understands the plans  discussed today and is in agreement with them.  She knows  to contact our office if she develops concerns prior to her next appointment.  I provided 30 minutes of face-to-face time during this encounter and > 50% was spent counseling as documented under my assessment and plan.    Pamela Jews, NP  [facility] [address] [place] [address] Dept: [phone] Dept Fax: [phone]   No orders of the defined types were placed in this encounter.     CHIEF COMPLAINT:  CC: right breast cancer ER +  Current Treatment:  Enhertu every 21 days   INTERVAL HISTORY:  Pamela [name] is here today for repeat clinical assessment. She denies fevers or chills. She denies pain. Her appetite is good. Her weight has been stable.  Right breast cancer ER+ Most recently, receiving Enhertu every 3 weeks. Has had last few infusions held due to negative effects.  CT Chest, abdomen, and pelvis done 10/05/2022 with the following results -  IMPRESSION: 1. Stable examination without new or progressive finding in the chest, abdomen or pelvis to suggest recurrent or metastatic disease. 2. Stable tiny bilateral thoracic inlet lymph nodes. 3.  Aortic Atherosclerosis (ICD10-I70.0).  Echocardiogram done 08/21/2022. She has normal EF at 60-65%. There is no ventricular wall motion abnormality. There is no left ventricular diastolic dysfunction. She has mild left ventricular hypertrophy.   10/19/2022 - plant to proceed with cycle 16 day 1 Enhertu.   I have reviewed the past medical history, past surgical history, social history and family history with the patient and they are unchanged from previous note.  ALLERGIES:  is allergic to pollen extract, compazine [prochlorperazine], latex, codeine, diflucan [fluconazole], shellfish allergy, and betadine [povidone iodine].  MEDICATIONS:  Current Outpatient Medications  Medication Sig Dispense  Refill   amLODipine (NORVASC) 10 MG tablet Take 1 tablet (10 mg total) by mouth daily. 90 tablet 2   aspirin EC 81 MG tablet Take 1 tablet (81 mg total) by mouth daily. Swallow whole. 30 tablet 11   azithromycin (ZITHROMAX Z-PAK) 250 MG tablet Take as directed 6 each 0   benzonatate (TESSALON) 100 MG capsule Take 1 capsule (100 mg total) by mouth 3 (three) times daily as needed for cough. 30 capsule 0   cetirizine (ZYRTEC ALLERGY) 10 MG tablet Take 1 tablet (10 mg total) by mouth daily. 30 tablet 2   EDARBYCLOR 40-25 MG TABS Take 1 tablet by mouth daily. 90 tablet 2   furosemide (LASIX) 20 MG tablet Take 1 tablet (20 mg total) by mouth as needed. TAKE 1 TABLET AS NEEDED FOR SWELLING OR SHORTNESS OF BREATH OR WEIGHT GAIN. 3 POUNDS WITHIN 24HR OR 5 POUNDS 7 DAYS 90 tablet 3   levonorgestrel (MIRENA, 52 MG,) 20 MCG/DAY IUD 1 each by Intrauterine route once.     lidocaine-prilocaine (EMLA) cream Apply to affected area once AS DIRECTED 30 g 3   linaclotide (LINZESS) 72 MCG capsule Take 1 capsule (72 mcg total) by mouth daily before breakfast. 30 capsule 0   potassium chloride SA (KLOR-CON M) 10 MEQ tablet Take 4 tablets (40 mEq total) by mouth daily. 120 tablet 3   rosuvastatin (CRESTOR) 40 MG tablet Take 1 tablet (40 mg total) by mouth daily. 90 tablet 3   No current facility-administered medications for this visit.   Facility-Administered Medications Ordered in Other Visits  Medication Dose Route Frequency Provider Last Rate Last Admin   0.9 %  sodium chloride infusion   Intravenous Continuous Pamela Croissant, MD  0.9 %  sodium chloride infusion   Intravenous Once Pamela Croissant, MD       0.9 %  sodium chloride infusion   Intravenous Once Pamela Croissant, MD       acetaminophen (TYLENOL) tablet 650 mg  650 mg Oral Once Pamela Croissant, MD       cyanocobalamin (VITAMIN B12) injection 1,000 mcg  1,000 mcg Intramuscular Once Pamela Croissant, MD       dextrose 5 % solution   Intravenous Once Pamela Croissant, MD       fam-trastuzumab deruxtecan-nxki (ENHERTU) 300 mg in dextrose 5 % 100 mL chemo infusion  300 mg Intravenous Once Pamela Croissant, MD       fosaprepitant (EMEND) 150 mg in sodium chloride 0.9 % 145 mL IVPB  150 mg Intravenous Once Pamela Croissant, MD       heparin lock flush 100 unit/mL  500 Units Intracatheter Once Pamela Croissant, MD       heparin lock flush 100 unit/mL  500 Units Intracatheter Once PRN Pamela Croissant, MD       palonosetron (ALOXI) injection 0.25 mg  0.25 mg Intravenous Once Pamela Croissant, MD       potassium chloride 10 mEq in 100 mL IVPB  10 mEq Intravenous Once Pamela Croissant, MD       sodium chloride flush (NS) 0.9 % injection 10 mL  10 mL Intracatheter Once Pamela Croissant, MD       sodium chloride flush (NS) 0.9 % injection 10 mL  10 mL Intracatheter PRN Pamela Croissant, MD        HISTORY OF PRESENT ILLNESS:   Oncology History  Malignant neoplasm of lower-outer quadrant of right breast of female, estrogen receptor positive (HCC)  05/03/2020 Initial Diagnosis   Screening mammogram showed a 1.0cm mass at the 6 o'clock position in the right breast. Diagnostic mammogram and US showed the 1.2cm mass at the 6 o'clock position in the right breast. Biopsy showed IDC, grade 3, HER-2 positive (3+), ER 15% weak, PR-, Ki67 80%.   05/19/2020 Genetic Testing   Negative hereditary cancer genetic testing: no pathogenic variants detected in [facility] Breast STAT Panel and Common Hereditary Cancers Panel.  The report dates are May 19, 2020 (STAT) and May 23, 2020 (Common Hereditary).   The Common Hereditary Cancers Panel offered by [facility] includes sequencing and/or deletion duplication testing of the following 47 genes: APC, ATM, AXIN2, BARD1, BMPR1A, BRCA1, BRCA2, BRIP1, CDH1, CDK4, CDKN2A (p14ARF), CDKN2A (p16INK4a), CHEK2, CTNNA1, DICER1, EPCAM (Deletion/duplication testing only), GREM1 (promoter region deletion/duplication testing only), GREM1, HOXB13, KIT, MEN1, MLH1, MSH2, MSH3,  MSH6, MUTYH, NBN, NF1, NHTL1, PALB2, PDGFRA, PMS2, POLD1, POLE, PTEN, RAD50, RAD51C, RAD51D, SDHA, SDHB, SDHC, SDHD, SMAD4, SMARCA4. STK11, TP53, TSC1, TSC2, and VHL.  The following genes were evaluated for sequence changes only: SDHA and HOXB13 c.251G>A variant only.es only: SDHA and HOXB13 c.251G>A variant only.   06/02/2020 Surgery   Right lumpectomy ([facility]): invasive and in situ ductal carcinoma, 1.3cm, clear margins, 1 right axillary lymph node negative for carcinoma.   06/02/2020 Cancer Staging   Staging form: Breast, AJCC 8th Edition - Pathologic stage from 06/02/2020: Stage IA (pT1c, pN0, cM0, G3, ER+, PR-, HER2+) - Signed by Loa Socks, NP on 06/15/2020 Stage prefix: Initial diagnosis Histologic grading system: 3 grade system   07/08/2020 - 10/21/2020 Chemotherapy   Taxol Herceptin followed by Herceptin maintenance   04/14/2021 -  Anti-estrogen oral therapy   Tamoxifen 10 mg   07/13/2021 Imaging  CT angiogram: Right supraclavicular lymph node 5 cm, right internal mammary lymph nodes 1.8 cm    08/06/2021 PET scan   PET CT scan: Ext Right Supra clav LN along with Internal mammary and Rt Upper Mediastinal and Rt Axillary LN   08/2021 Initial Biopsy   Right supraclavicular lymphadenopathy: Biopsy: Metastatic poorly differentiated adenocarcinoma, ER +40%, PR negative, HER2 positive 3+    08/21/2021 -  Chemotherapy   Enhertu every 3 weeks    03/08/2022 Imaging   CT chest/abdomen/pelvis  1. No new or progressive findings in the chest, abdomen or pelvis. 2. Signs of RIGHT breast lumpectomy with areas of central fat necrosis unchanged from previous imaging. 3. Stable small lymph nodes throughout the chest, largest with area of soft tissue about surgical clips in the RIGHT axilla. 4. Stable appearance of thoracic inlet lymph nodes, not shown to be hypermetabolic on prior PET imaging. 5. IUD in-situ. 6. Aortic atherosclerosis.   10/05/2022 Imaging   CT Chest, Abdomen, and  Pelvis with contrast IMPRESSION: 1. Stable examination without new or progressive finding in the chest, abdomen or pelvis to suggest recurrent or metastatic disease. 2. Stable tiny bilateral thoracic inlet lymph nodes. 3.  Aortic Atherosclerosis (ICD10-I70.0).       REVIEW OF SYSTEMS:   Constitutional: Denies fevers, chills or abnormal weight loss Eyes: Denies blurriness of vision Ears, nose, mouth, throat, and face: Denies mucositis or sore throat Respiratory: Denies cough, dyspnea or wheezes Cardiovascular: Denies palpitation, chest discomfort or lower extremity swelling Gastrointestinal:  Denies nausea, heartburn or change in bowel habits Skin: has intermittent patches of dry skin with rough rash. Currently improving.  Lymphatics: Denies new lymphadenopathy or easy bruising Neurological:Denies numbness, tingling or new weaknesses Behavioral/Psych: Mood is stable, no new changes  All other systems were reviewed with the patient and are negative.   VITALS:   Today's Vitals   10/19/22 1014 10/19/22 1025  BP: (!) 142/75   Pulse: 74   Resp: 14   Temp: 98.5 F (36.9 C)   TempSrc: Oral   SpO2: 100%   Weight: (!) 306 lb (138.8 kg)   PainSc:  0-No pain   Body mass index is 45.19 kg/m.   Wt Readings from Last 3 Encounters:  10/19/22 (!) 306 lb (138.8 kg)  09/20/22 (!) 306 lb 11.2 oz (139.1 kg)  08/30/22 (!) 301 lb (136.5 kg)    Body mass index is 45.19 kg/m.  Performance status (ECOG): 1 - Symptomatic but completely ambulatory  PHYSICAL EXAM:   GENERAL:alert, no distress and comfortable SKIN: skin color, texture, turgor are normal, no rashes or significant lesions EYES: normal, Conjunctiva are pink and non-injected, sclera clear OROPHARYNX:no exudate, no erythema and lips, buccal mucosa, and tongue normal  NECK: supple, thyroid normal size, non-tender, without nodularity LYMPH:  no palpable lymphadenopathy in the cervical, axillary or inguinal LUNGS: clear to  auscultation and percussion with normal breathing effort HEART: regular rate & rhythm and no murmurs and no lower extremity edema ABDOMEN:abdomen soft, non-tender and normal bowel sounds Musculoskeletal:no cyanosis of digits and no clubbing  NEURO: alert & oriented x 3 with fluent speech, no focal motor/sensory deficits  LABORATORY DATA:  I have reviewed the data as listed    Component Value Date/Time   NA 141 10/19/2022 0949   NA 142 09/22/2019 1040   K 3.4 (L) 10/19/2022 0949   CL 107 10/19/2022 0949   CO2 30 10/19/2022 0949   GLUCOSE 95 10/19/2022 0949   BUN 6 10/19/2022 0949  BUN 11 09/22/2019 1040   CREATININE 0.68 10/19/2022 0949   CALCIUM 8.6 (L) 10/19/2022 0949   PROT 6.4 (L) 10/19/2022 0949   PROT 6.7 06/17/2019 1054   ALBUMIN 3.3 (L) 10/19/2022 0949   ALBUMIN 4.5 06/17/2019 1054   AST 34 10/19/2022 0949   ALT 18 10/19/2022 0949   ALKPHOS 108 10/19/2022 0949   BILITOT 1.4 (H) 10/19/2022 0949   GFRNONAA >60 10/19/2022 0949   GFRAA 77 09/22/2019 1040    Lab Results  Component Value Date   WBC 3.1 (L) 10/19/2022   NEUTROABS 1.3 (L) 10/19/2022   HGB 11.1 (L) 10/19/2022   HCT 33.4 (L) 10/19/2022   MCV 88.8 10/19/2022   PLT 190 10/19/2022     RADIOGRAPHIC STUDIES: I have personally reviewed the radiological images as listed and agreed with the findings in the report. CT CHEST ABDOMEN PELVIS W CONTRAST  Result Date: 10/05/2022 CLINICAL DATA:  History of invasive breast cancer. * Tracking Code: BO *. EXAM: CT CHEST, ABDOMEN, AND PELVIS WITH CONTRAST TECHNIQUE: Multidetector CT imaging of the chest, abdomen and pelvis was performed following the standard protocol during bolus administration of intravenous contrast. RADIATION DOSE REDUCTION: This exam was performed according to the departmental dose-optimization program which includes automated exposure control, adjustment of the mA and/or kV according to patient size and/or use of iterative reconstruction technique.  CONTRAST:  OMNIPAQUE IOHEXOL 300 MG/ML  SOLN COMPARISON:  Multiple priors including most recent CT June 23, 2022 FINDINGS: CT CHEST FINDINGS Cardiovascular: Accessed right chest Port-A-Cath with tip at the superior cavoatrial junction. Aortic atherosclerosis. Normal caliber thoracic aorta. No central pulmonary embolus on this nondedicated study. Normal size heart. Trace pericardial effusion is similar prior and within physiologic normal limits. Mediastinum/Nodes: No suspicious thyroid nodule. Tiny bilateral thoracic inlet lymph nodes are similar prior for instance measuring 7 mm in short axis on image 4/2 on the left. No pathologically enlarged mediastinal, hilar or axillary lymph nodes. Prior right axillary lymph node dissection. The esophagus is grossly unremarkable. Lungs/Pleura: No suspicious pulmonary nodules or masses. No focal airspace consolidation. No pleural effusion. No pneumothorax. Musculoskeletal: Surgical changes in the right breast. No aggressive lytic or blastic lesion of bone. CT ABDOMEN PELVIS FINDINGS Hepatobiliary: No suspicious hepatic lesion. Gallbladder is unremarkable. No biliary ductal dilation. Pancreas: No pancreatic ductal dilation or evidence of acute inflammation Spleen: No splenomegaly or focal splenic lesion. Adrenals/Urinary Tract: Bilateral adrenal glands appear normal. No hydronephrosis. Kidneys demonstrate symmetric enhancement. Urinary bladder is minimally distended limiting evaluation. Stomach/Bowel: No radiopaque enteric contrast material was administered. Stomach is unremarkable for degree of distension. No pathologic dilation of small or large bowel. Normal appendix. No evidence of acute bowel inflammation. Vascular/Lymphatic: Aortic atherosclerosis. Normal caliber thoracic aorta. Smooth IVC contours. The portal, splenic and superior mesenteric veins are patent. No pathologically enlarged abdominal or pelvic lymph nodes. Reproductive: Intrauterine device appears  appropriate in positioning. Other: No significant abdominopelvic free fluid. Musculoskeletal: No aggressive lytic or blastic lesion of bone. Multilevel degenerative changes spine. Degenerative change of the bilateral hips. Tiny sclerotic lesion in the right iliac bone adjacent to the SI joint is stable dating back to PET-CT August 04, 2021 and which was non hypermetabolic and is compatible with a benign finding IMPRESSION: 1. Stable examination without new or progressive finding in the chest, abdomen or pelvis to suggest recurrent or metastatic disease. 2. Stable tiny bilateral thoracic inlet lymph nodes. 3.  Aortic Atherosclerosis (ICD10-I70.0). Electronically Signed   By: Christell Constant.D.  On: 10/05/2022 06:52

## 2022-10-19 ENCOUNTER — Inpatient Hospital Stay (HOSPITAL_BASED_OUTPATIENT_CLINIC_OR_DEPARTMENT_OTHER): Payer: 59 | Admitting: Nurse Practitioner

## 2022-10-19 ENCOUNTER — Other Ambulatory Visit: Payer: Self-pay

## 2022-10-19 ENCOUNTER — Inpatient Hospital Stay: Payer: 59 | Attending: Hematology and Oncology

## 2022-10-19 ENCOUNTER — Inpatient Hospital Stay: Payer: 59

## 2022-10-19 VITALS — BP 142/75 | HR 74 | Temp 98.5°F | Resp 14 | Wt 306.0 lb

## 2022-10-19 DIAGNOSIS — T451X5A Adverse effect of antineoplastic and immunosuppressive drugs, initial encounter: Secondary | ICD-10-CM | POA: Insufficient documentation

## 2022-10-19 DIAGNOSIS — D72819 Decreased white blood cell count, unspecified: Secondary | ICD-10-CM | POA: Insufficient documentation

## 2022-10-19 DIAGNOSIS — Z5112 Encounter for antineoplastic immunotherapy: Secondary | ICD-10-CM | POA: Insufficient documentation

## 2022-10-19 DIAGNOSIS — I700 Atherosclerosis of aorta: Secondary | ICD-10-CM | POA: Diagnosis not present

## 2022-10-19 DIAGNOSIS — I517 Cardiomegaly: Secondary | ICD-10-CM | POA: Diagnosis not present

## 2022-10-19 DIAGNOSIS — Z888 Allergy status to other drugs, medicaments and biological substances status: Secondary | ICD-10-CM | POA: Insufficient documentation

## 2022-10-19 DIAGNOSIS — I3139 Other pericardial effusion (noninflammatory): Secondary | ICD-10-CM | POA: Diagnosis not present

## 2022-10-19 DIAGNOSIS — Z170 Estrogen receptor positive status [ER+]: Secondary | ICD-10-CM

## 2022-10-19 DIAGNOSIS — C50511 Malignant neoplasm of lower-outer quadrant of right female breast: Secondary | ICD-10-CM

## 2022-10-19 DIAGNOSIS — Z885 Allergy status to narcotic agent status: Secondary | ICD-10-CM | POA: Insufficient documentation

## 2022-10-19 DIAGNOSIS — D6481 Anemia due to antineoplastic chemotherapy: Secondary | ICD-10-CM | POA: Insufficient documentation

## 2022-10-19 DIAGNOSIS — Z95828 Presence of other vascular implants and grafts: Secondary | ICD-10-CM

## 2022-10-19 DIAGNOSIS — N6315 Unspecified lump in the right breast, overlapping quadrants: Secondary | ICD-10-CM | POA: Diagnosis not present

## 2022-10-19 DIAGNOSIS — Z79899 Other long term (current) drug therapy: Secondary | ICD-10-CM | POA: Insufficient documentation

## 2022-10-19 DIAGNOSIS — E876 Hypokalemia: Secondary | ICD-10-CM | POA: Diagnosis not present

## 2022-10-19 DIAGNOSIS — Z883 Allergy status to other anti-infective agents status: Secondary | ICD-10-CM | POA: Insufficient documentation

## 2022-10-19 DIAGNOSIS — R110 Nausea: Secondary | ICD-10-CM

## 2022-10-19 DIAGNOSIS — C50919 Malignant neoplasm of unspecified site of unspecified female breast: Secondary | ICD-10-CM

## 2022-10-19 LAB — CMP (CANCER CENTER ONLY)
ALT: 18 U/L (ref 0–44)
AST: 34 U/L (ref 15–41)
Albumin: 3.3 g/dL — ABNORMAL LOW (ref 3.5–5.0)
Alkaline Phosphatase: 108 U/L (ref 38–126)
Anion gap: 4 — ABNORMAL LOW (ref 5–15)
BUN: 6 mg/dL (ref 6–20)
CO2: 30 mmol/L (ref 22–32)
Calcium: 8.6 mg/dL — ABNORMAL LOW (ref 8.9–10.3)
Chloride: 107 mmol/L (ref 98–111)
Creatinine: 0.68 mg/dL (ref 0.44–1.00)
GFR, Estimated: >60 mL/min (ref >60)
Glucose, Bld: 95 mg/dL (ref 70–99)
Potassium: 3.4 mmol/L — ABNORMAL LOW (ref 3.5–5.1)
Sodium: 141 mmol/L (ref 135–145)
Total Bilirubin: 1.4 mg/dL — ABNORMAL HIGH (ref 0.3–1.2)
Total Protein: 6.4 g/dL — ABNORMAL LOW (ref 6.5–8.1)

## 2022-10-19 LAB — CBC WITH DIFFERENTIAL (CANCER CENTER ONLY)
Abs Immature Granulocytes: 0.01 10*3/uL (ref 0.00–0.07)
Basophils Absolute: 0 10*3/uL (ref 0.0–0.1)
Basophils Relative: 1 %
Eosinophils Absolute: 0.2 10*3/uL (ref 0.0–0.5)
Eosinophils Relative: 8 %
HCT: 33.4 % — ABNORMAL LOW (ref 36.0–46.0)
Hemoglobin: 11.1 g/dL — ABNORMAL LOW (ref 12.0–15.0)
Immature Granulocytes: 0 %
Lymphocytes Relative: 30 %
Lymphs Abs: 0.9 10*3/uL (ref 0.7–4.0)
MCH: 29.5 pg (ref 26.0–34.0)
MCHC: 33.2 g/dL (ref 30.0–36.0)
MCV: 88.8 fL (ref 80.0–100.0)
Monocytes Absolute: 0.6 10*3/uL (ref 0.1–1.0)
Monocytes Relative: 20 %
Neutro Abs: 1.3 10*3/uL — ABNORMAL LOW (ref 1.7–7.7)
Neutrophils Relative %: 41 %
Platelet Count: 190 10*3/uL (ref 150–400)
RBC: 3.76 MIL/uL — ABNORMAL LOW (ref 3.87–5.11)
RDW: 15.3 % (ref 11.5–15.5)
WBC Count: 3.1 10*3/uL — ABNORMAL LOW (ref 4.0–10.5)
nRBC: 0 % (ref 0.0–0.2)

## 2022-10-19 MED ORDER — SODIUM CHLORIDE 0.9 % IV SOLN: INTRAVENOUS | Status: AC

## 2022-10-19 MED ORDER — ACETAMINOPHEN 325 MG PO TABS
650.0000 mg | ORAL_TABLET | Freq: Once | ORAL | Status: AC
  Administered 2022-10-19: 650 mg via ORAL
  Filled 2022-10-19: qty 2

## 2022-10-19 MED ORDER — POTASSIUM CHLORIDE 10 MEQ/100ML IV SOLN
10.0000 meq | Freq: Once | INTRAVENOUS | Status: AC
  Administered 2022-10-19: 10 meq via INTRAVENOUS
  Filled 2022-10-19: qty 100

## 2022-10-19 MED ORDER — SODIUM CHLORIDE 0.9% FLUSH
10.0000 mL | INTRAVENOUS | Status: DC | PRN
  Administered 2022-10-19: 10 mL

## 2022-10-19 MED ORDER — FAM-TRASTUZUMAB DERUXTECAN-NXKI CHEMO 100 MG IV SOLR
300.0000 mg | Freq: Once | INTRAVENOUS | Status: AC
  Administered 2022-10-19: 300 mg via INTRAVENOUS
  Filled 2022-10-19: qty 15

## 2022-10-19 MED ORDER — DEXTROSE 5 % IV SOLN: Freq: Once | INTRAVENOUS | Status: AC

## 2022-10-19 MED ORDER — SODIUM CHLORIDE 0.9 % IV SOLN: Freq: Once | INTRAVENOUS | Status: DC

## 2022-10-19 MED ORDER — SODIUM CHLORIDE 0.9% FLUSH
10.0000 mL | Freq: Once | INTRAVENOUS | Status: AC
  Administered 2022-10-19: 10 mL

## 2022-10-19 MED ORDER — CYANOCOBALAMIN 1000 MCG/ML IJ SOLN
1000.0000 ug | Freq: Once | INTRAMUSCULAR | Status: AC
  Administered 2022-10-19: 1000 ug via INTRAMUSCULAR
  Filled 2022-10-19: qty 1

## 2022-10-19 MED ORDER — HEPARIN SOD (PORK) LOCK FLUSH 100 UNIT/ML IV SOLN
500.0000 [IU] | Freq: Once | INTRAVENOUS | Status: AC | PRN
  Administered 2022-10-19: 500 [IU]

## 2022-10-19 MED ORDER — PALONOSETRON HCL INJECTION 0.25 MG/5ML
0.2500 mg | Freq: Once | INTRAVENOUS | Status: AC
  Administered 2022-10-19: 0.25 mg via INTRAVENOUS
  Filled 2022-10-19: qty 5

## 2022-10-19 MED ORDER — SODIUM CHLORIDE 0.9 % IV SOLN
150.0000 mg | Freq: Once | INTRAVENOUS | Status: AC
  Administered 2022-10-19: 150 mg via INTRAVENOUS
  Filled 2022-10-19: qty 5
  Filled 2022-10-19: qty 150

## 2022-10-19 MED ORDER — SODIUM CHLORIDE 0.9 % IV SOLN: Freq: Once | INTRAVENOUS | Status: AC

## 2022-10-19 NOTE — Patient Instructions (Signed)
[place] [facility]  Discharge Instructions: Thank you for choosing [facility] to provide your oncology and hematology care.   If you have a lab appointment with the Cancer Center, please go directly to the Cancer Center and check in at the registration area.   Wear comfortable clothing and clothing appropriate for easy access to any Portacath or PICC line.   We strive to give you quality time with your provider. You may need to reschedule your appointment if you arrive late (15 or more minutes).  Arriving late affects you and other patients whose appointments are after yours.  Also, if you miss three or more appointments without notifying the office, you may be dismissed from the clinic at the provider's discretion.      For prescription refill requests, have your pharmacy contact our office and allow 72 hours for refills to be completed.    Today you received the following chemotherapy and/or immunotherapy agents Enhertu.   To help prevent nausea and vomiting after your treatment, we encourage you to take your nausea medication as directed.  BELOW ARE SYMPTOMS THAT SHOULD BE REPORTED IMMEDIATELY: *FEVER GREATER THAN 100.4 F (38 C) OR HIGHER *CHILLS OR SWEATING *NAUSEA AND VOMITING THAT IS NOT CONTROLLED WITH YOUR NAUSEA MEDICATION *UNUSUAL SHORTNESS OF BREATH *UNUSUAL BRUISING OR BLEEDING *URINARY PROBLEMS (pain or burning when urinating, or frequent urination) *BOWEL PROBLEMS (unusual diarrhea, constipation, pain near the anus) TENDERNESS IN MOUTH AND THROAT WITH OR WITHOUT PRESENCE OF ULCERS (sore throat, sores in mouth, or a toothache) UNUSUAL RASH, SWELLING OR PAIN  UNUSUAL VAGINAL DISCHARGE OR ITCHING   Items with * indicate a potential emergency and should be followed up as soon as possible or go to the Emergency Department if any problems should occur.  Please show the CHEMOTHERAPY ALERT CARD or IMMUNOTHERAPY ALERT CARD at check-in  to the Emergency Department and triage nurse.  Should you have questions after your visit or need to cancel or reschedule your appointment, please contact [facility]  Dept: [phone]  and follow the prompts.  Office hours are 8:00 a.m. to 4:30 p.m. Monday - Friday. Please note that voicemails left after 4:00 p.m. may not be returned until the following business day.  We are closed weekends and major holidays. You have access to a nurse at all times for urgent questions. Please call the main number to the clinic Dept: [phone] and follow the prompts.   For any non-urgent questions, you may also contact your provider using MyChart. We now offer e-Visits for anyone 18 and older to request care online for non-urgent symptoms. For details visit [email or web address].   Also download the MyChart app! Go to the app store, search "MyChart", open the app, select [facility], and log in with your MyChart username and password.   

## 2022-10-19 NOTE — Assessment & Plan Note (Addendum)
06/02/2020:Right lumpectomy ([facility]): invasive and in situ ductal carcinoma, 1.3cm, clear margins, 1 right axillary lymph node negative for carcinoma. ER 15% weak, PR-,HER-2 positive (3+) Ki67 80%.   Treatment plan: 1.  Adjuvant chemotherapy with Taxol Herceptin followed by Herceptin maintenance.  Stopped after 11 cycles of Taxol Herceptin maintenance completed 07/07/2021  2.  Adjuvant radiation therapy 11/23/2020-12/19/2020 3.  Adjuvant antiestrogen therapy with tamoxifen started October 2022 4.  CT angiogram 07/13/2021: Right supraclavicular lymph node 5 cm, right internal mammary lymph nodes 1.8 cm 5.  Metastatic breast cancer: June 2023:Right supraclavicular lymphadenopathy: Biopsy: Metastatic poorly differentiated adenocarcinoma, ER +40%, PR negative, HER2 positive 3+ PET CT scan 08/06/21: Ext Right Supra clav LN along with Internal mammary and Rt Upper Mediastinal and Rt Axillary LN -----------------------------------------------------------------------------------------------------  Current treatment: Enhertu started on 08/21/2021, (this treatment on hold) today is cycle 19   Enhertu toxicities: 1,.  Severe nausea, requiring IV fluids: Better since we added Emend 2. Chemotherapy-induced anemia: Monitoring closely, hemoglobin 11.1 3.  Hospitalization 11/26/2021-11/28/2021: Dizziness and fatigue, esophageal dysmotility 4.  Hypokalemia and diarrhea: Discontinued IV fluids because it was causing her leg swelling 5.  Leukopenia: ANC 1.5: Okay to treat.  Enhertu dose reduced previously   Echocardiogram done 08/21/2022. She has normal EF at 60-65%. There is no ventricular wall motion abnormality. There is no left ventricular diastolic dysfunction. She has mild left ventricular hypertrophy.    CT chest abdomen pelvis 06/25/2022: Stable exam.  No new or progressive findings. CT Chest, abdomen, and pelvis done 10/05/2022 with the following results -  IMPRESSION: 1. Stable examination without new or progressive  finding in the chest, abdomen or pelvis to suggest recurrent or metastatic disease. 2. Stable tiny bilateral thoracic inlet lymph nodes. 3.  Aortic Atherosclerosis (ICD10-I70.0).  10/19/2022 - plant to proceed with cycle 16 day 1 Enhertu.   She would like to discuss whether to stop Enhertu and switch to antiestrogen therapy like tamoxifen (patient had a menstrual cycle last week) plus or minus anti-HER2 oral therapy like lapatinib.  Return to clinic in 3 weeks. -labs/flush with follow up and infusion already scheduled with Dr. Georgiann Mohs.

## 2022-10-19 NOTE — Progress Notes (Signed)
Per Dr. Pamelia Hoit and Vincent Gros NP OK to trt with ANC 1.3 today

## 2022-10-26 ENCOUNTER — Telehealth: Payer: Self-pay

## 2022-10-26 NOTE — Telephone Encounter (Signed)
Pt called to ask if she can r/s her 9/6 appt for earlier appt. Unfortunately there is no availability for earlier appt.   She also inquired about FMLA renewal. She was advised to email ppw to our [facility] nurses and was provided their contact info. She verbalized thanks and understanding.

## 2022-10-29 ENCOUNTER — Encounter: Payer: Self-pay | Admitting: Hematology and Oncology

## 2022-11-02 ENCOUNTER — Other Ambulatory Visit: Payer: 59

## 2022-11-02 ENCOUNTER — Ambulatory Visit: Payer: 59

## 2022-11-02 ENCOUNTER — Ambulatory Visit: Payer: 59 | Admitting: Hematology and Oncology

## 2022-11-08 MED FILL — Fosaprepitant Dimeglumine For IV Infusion 150 MG (Base Eq): INTRAVENOUS | Qty: 5 | Status: AC

## 2022-11-09 ENCOUNTER — Inpatient Hospital Stay: Payer: 59 | Attending: Hematology and Oncology

## 2022-11-09 ENCOUNTER — Inpatient Hospital Stay: Payer: 59

## 2022-11-09 ENCOUNTER — Inpatient Hospital Stay (HOSPITAL_BASED_OUTPATIENT_CLINIC_OR_DEPARTMENT_OTHER): Payer: 59 | Admitting: Hematology and Oncology

## 2022-11-09 ENCOUNTER — Encounter: Payer: Self-pay | Admitting: Pharmacist

## 2022-11-09 VITALS — BP 138/69 | HR 78 | Temp 97.7°F | Resp 18 | Ht 69.0 in | Wt 306.3 lb

## 2022-11-09 DIAGNOSIS — Z5112 Encounter for antineoplastic immunotherapy: Secondary | ICD-10-CM | POA: Diagnosis present

## 2022-11-09 DIAGNOSIS — Z79899 Other long term (current) drug therapy: Secondary | ICD-10-CM | POA: Insufficient documentation

## 2022-11-09 DIAGNOSIS — D6481 Anemia due to antineoplastic chemotherapy: Secondary | ICD-10-CM | POA: Insufficient documentation

## 2022-11-09 DIAGNOSIS — I700 Atherosclerosis of aorta: Secondary | ICD-10-CM | POA: Insufficient documentation

## 2022-11-09 DIAGNOSIS — Z170 Estrogen receptor positive status [ER+]: Secondary | ICD-10-CM | POA: Diagnosis not present

## 2022-11-09 DIAGNOSIS — I517 Cardiomegaly: Secondary | ICD-10-CM | POA: Diagnosis not present

## 2022-11-09 DIAGNOSIS — R110 Nausea: Secondary | ICD-10-CM

## 2022-11-09 DIAGNOSIS — Z888 Allergy status to other drugs, medicaments and biological substances status: Secondary | ICD-10-CM | POA: Diagnosis not present

## 2022-11-09 DIAGNOSIS — Z95828 Presence of other vascular implants and grafts: Secondary | ICD-10-CM

## 2022-11-09 DIAGNOSIS — T451X5A Adverse effect of antineoplastic and immunosuppressive drugs, initial encounter: Secondary | ICD-10-CM | POA: Insufficient documentation

## 2022-11-09 DIAGNOSIS — D72819 Decreased white blood cell count, unspecified: Secondary | ICD-10-CM | POA: Diagnosis not present

## 2022-11-09 DIAGNOSIS — M7989 Other specified soft tissue disorders: Secondary | ICD-10-CM | POA: Diagnosis not present

## 2022-11-09 DIAGNOSIS — Z885 Allergy status to narcotic agent status: Secondary | ICD-10-CM | POA: Diagnosis not present

## 2022-11-09 DIAGNOSIS — E876 Hypokalemia: Secondary | ICD-10-CM | POA: Insufficient documentation

## 2022-11-09 DIAGNOSIS — Z883 Allergy status to other anti-infective agents status: Secondary | ICD-10-CM | POA: Insufficient documentation

## 2022-11-09 DIAGNOSIS — C50511 Malignant neoplasm of lower-outer quadrant of right female breast: Secondary | ICD-10-CM

## 2022-11-09 DIAGNOSIS — C50919 Malignant neoplasm of unspecified site of unspecified female breast: Secondary | ICD-10-CM

## 2022-11-09 LAB — CBC WITH DIFFERENTIAL (CANCER CENTER ONLY)
Abs Immature Granulocytes: 0.01 10*3/uL (ref 0.00–0.07)
Basophils Absolute: 0 10*3/uL (ref 0.0–0.1)
Basophils Relative: 1 %
Eosinophils Absolute: 0.2 10*3/uL (ref 0.0–0.5)
Eosinophils Relative: 5 %
HCT: 33.7 % — ABNORMAL LOW (ref 36.0–46.0)
Hemoglobin: 11 g/dL — ABNORMAL LOW (ref 12.0–15.0)
Immature Granulocytes: 0 %
Lymphocytes Relative: 26 %
Lymphs Abs: 0.9 10*3/uL (ref 0.7–4.0)
MCH: 28.7 pg (ref 26.0–34.0)
MCHC: 32.6 g/dL (ref 30.0–36.0)
MCV: 88 fL (ref 80.0–100.0)
Monocytes Absolute: 0.7 10*3/uL (ref 0.1–1.0)
Monocytes Relative: 19 %
Neutro Abs: 1.7 10*3/uL (ref 1.7–7.7)
Neutrophils Relative %: 49 %
Platelet Count: 203 10*3/uL (ref 150–400)
RBC: 3.83 MIL/uL — ABNORMAL LOW (ref 3.87–5.11)
RDW: 15.6 % — ABNORMAL HIGH (ref 11.5–15.5)
WBC Count: 3.5 10*3/uL — ABNORMAL LOW (ref 4.0–10.5)
nRBC: 0 % (ref 0.0–0.2)

## 2022-11-09 LAB — CMP (CANCER CENTER ONLY)
ALT: 13 U/L (ref 0–44)
AST: 26 U/L (ref 15–41)
Albumin: 3.4 g/dL — ABNORMAL LOW (ref 3.5–5.0)
Alkaline Phosphatase: 101 U/L (ref 38–126)
Anion gap: 4 — ABNORMAL LOW (ref 5–15)
BUN: 12 mg/dL (ref 6–20)
CO2: 29 mmol/L (ref 22–32)
Calcium: 8.9 mg/dL (ref 8.9–10.3)
Chloride: 109 mmol/L (ref 98–111)
Creatinine: 0.67 mg/dL (ref 0.44–1.00)
GFR, Estimated: >60 mL/min (ref >60)
Glucose, Bld: 95 mg/dL (ref 70–99)
Potassium: 3.2 mmol/L — ABNORMAL LOW (ref 3.5–5.1)
Sodium: 142 mmol/L (ref 135–145)
Total Bilirubin: 1.6 mg/dL — ABNORMAL HIGH (ref 0.3–1.2)
Total Protein: 6.3 g/dL — ABNORMAL LOW (ref 6.5–8.1)

## 2022-11-09 MED ORDER — FAM-TRASTUZUMAB DERUXTECAN-NXKI CHEMO 100 MG IV SOLR
300.0000 mg | Freq: Once | INTRAVENOUS | Status: AC
  Administered 2022-11-09: 300 mg via INTRAVENOUS
  Filled 2022-11-09: qty 15

## 2022-11-09 MED ORDER — HEPARIN SOD (PORK) LOCK FLUSH 100 UNIT/ML IV SOLN
500.0000 [IU] | Freq: Once | INTRAVENOUS | Status: AC | PRN
  Administered 2022-11-09: 500 [IU]

## 2022-11-09 MED ORDER — CYANOCOBALAMIN 1000 MCG/ML IJ SOLN
1000.0000 ug | Freq: Once | INTRAMUSCULAR | Status: AC
  Administered 2022-11-09: 1000 ug via INTRAMUSCULAR
  Filled 2022-11-09: qty 1

## 2022-11-09 MED ORDER — ACETAMINOPHEN 325 MG PO TABS
650.0000 mg | ORAL_TABLET | Freq: Once | ORAL | Status: AC
  Administered 2022-11-09: 650 mg via ORAL
  Filled 2022-11-09: qty 2

## 2022-11-09 MED ORDER — POTASSIUM CHLORIDE 10 MEQ/100ML IV SOLN
10.0000 meq | Freq: Once | INTRAVENOUS | Status: AC
  Administered 2022-11-09: 10 meq via INTRAVENOUS
  Filled 2022-11-09: qty 100

## 2022-11-09 MED ORDER — DEXTROSE 5 % IV SOLN: Freq: Once | INTRAVENOUS | Status: AC

## 2022-11-09 MED ORDER — PALONOSETRON HCL INJECTION 0.25 MG/5ML
0.2500 mg | Freq: Once | INTRAVENOUS | Status: AC
  Administered 2022-11-09: 0.25 mg via INTRAVENOUS
  Filled 2022-11-09: qty 5

## 2022-11-09 MED ORDER — SODIUM CHLORIDE 0.9 % IV SOLN: Freq: Once | INTRAVENOUS | Status: AC

## 2022-11-09 MED ORDER — SODIUM CHLORIDE 0.9% FLUSH
10.0000 mL | Freq: Once | INTRAVENOUS | Status: AC
  Administered 2022-11-09: 10 mL

## 2022-11-09 MED ORDER — SODIUM CHLORIDE 0.9% FLUSH
10.0000 mL | INTRAVENOUS | Status: DC | PRN
  Administered 2022-11-09: 10 mL

## 2022-11-09 MED ORDER — SODIUM CHLORIDE 0.9 % IV SOLN
150.0000 mg | Freq: Once | INTRAVENOUS | Status: AC
  Administered 2022-11-09: 150 mg via INTRAVENOUS
  Filled 2022-11-09: qty 150

## 2022-11-09 NOTE — Progress Notes (Signed)
Patient Care Team: Dorothyann Peng, MD as PCP - General (Internal Medicine) Kathryne Hitch, MD as Consulting Physician (Orthopedic Surgery) Pershing Proud, RN as Oncology Nurse Navigator Donnelly Angelica, RN as Oncology Nurse Navigator Harriette Bouillon, MD as Consulting Physician (General Surgery) Serena Croissant, MD as Consulting Physician (Hematology and Oncology) Lonie Peak, MD as Attending Physician (Radiation Oncology)  DIAGNOSIS:  Encounter Diagnosis  Name Primary?   Malignant neoplasm of lower-outer quadrant of right breast of female, estrogen receptor positive (HCC) Yes    SUMMARY OF ONCOLOGIC HISTORY: Oncology History  Malignant neoplasm of lower-outer quadrant of right breast of female, estrogen receptor positive (HCC)  05/03/2020 Initial Diagnosis   Screening mammogram showed a 1.0cm mass at the 6 o'clock position in the right breast. Diagnostic mammogram and US showed the 1.2cm mass at the 6 o'clock position in the right breast. Biopsy showed IDC, grade 3, HER-2 positive (3+), ER 15% weak, PR-, Ki67 80%.   05/19/2020 Genetic Testing   Negative hereditary cancer genetic testing: no pathogenic variants detected in [facility] Breast STAT Panel and Common Hereditary Cancers Panel.  The report dates are May 19, 2020 (STAT) and May 23, 2020 (Common Hereditary).   The Common Hereditary Cancers Panel offered by [facility] includes sequencing and/or deletion duplication testing of the following 47 genes: APC, ATM, AXIN2, BARD1, BMPR1A, BRCA1, BRCA2, BRIP1, CDH1, CDK4, CDKN2A (p14ARF), CDKN2A (p16INK4a), CHEK2, CTNNA1, DICER1, EPCAM (Deletion/duplication testing only), GREM1 (promoter region deletion/duplication testing only), GREM1, HOXB13, KIT, MEN1, MLH1, MSH2, MSH3, MSH6, MUTYH, NBN, NF1, NHTL1, PALB2, PDGFRA, PMS2, POLD1, POLE, PTEN, RAD50, RAD51C, RAD51D, SDHA, SDHB, SDHC, SDHD, SMAD4, SMARCA4. STK11, TP53, TSC1, TSC2, and VHL.  The following genes were evaluated for sequence  changes only: SDHA and HOXB13 c.251G>A variant only.es only: SDHA and HOXB13 c.251G>A variant only.   06/02/2020 Surgery   Right lumpectomy ([facility]): invasive and in situ ductal carcinoma, 1.3cm, clear margins, 1 right axillary lymph node negative for carcinoma.   06/02/2020 Cancer Staging   Staging form: Breast, AJCC 8th Edition - Pathologic stage from 06/02/2020: Stage IA (pT1c, pN0, cM0, G3, ER+, PR-, HER2+) - Signed by Loa Socks, NP on 06/15/2020 Stage prefix: Initial diagnosis Histologic grading system: 3 grade system   07/08/2020 - 10/21/2020 Chemotherapy   Taxol Herceptin followed by Herceptin maintenance   04/14/2021 -  Anti-estrogen oral therapy   Tamoxifen 10 mg   07/13/2021 Imaging   CT angiogram: Right supraclavicular lymph node 5 cm, right internal mammary lymph nodes 1.8 cm    08/06/2021 PET scan   PET CT scan: Ext Right Supra clav LN along with Internal mammary and Rt Upper Mediastinal and Rt Axillary LN   08/2021 Initial Biopsy   Right supraclavicular lymphadenopathy: Biopsy: Metastatic poorly differentiated adenocarcinoma, ER +40%, PR negative, HER2 positive 3+    08/21/2021 -  Chemotherapy   Enhertu every 3 weeks    03/08/2022 Imaging   CT chest/abdomen/pelvis  1. No new or progressive findings in the chest, abdomen or pelvis. 2. Signs of RIGHT breast lumpectomy with areas of central fat necrosis unchanged from previous imaging. 3. Stable small lymph nodes throughout the chest, largest with area of soft tissue about surgical clips in the RIGHT axilla. 4. Stable appearance of thoracic inlet lymph nodes, not shown to be hypermetabolic on prior PET imaging. 5. IUD in-situ. 6. Aortic atherosclerosis.   08/21/2022 Imaging   Echocardiogram  -normal LVEF at 60-65%. -no left ventricular wall motion abnormalities  -normal left ventricular diastolic function  -  mild left ventricular hypertrophy.    10/05/2022 Imaging   CT Chest, Abdomen, and Pelvis with  contrast IMPRESSION: 1. Stable examination without new or progressive finding in the chest, abdomen or pelvis to suggest recurrent or metastatic disease. 2. Stable tiny bilateral thoracic inlet lymph nodes. 3.  Aortic Atherosclerosis (ICD10-I70.0).     CHIEF COMPLIANT: Cycle 19 Enhertu   INTERVAL HISTORY: Pamela [name] is a  57 y.o. with the above-mentioned metastatic breast cancer currently on treatment with Enhertu. She presents to the clinic for a follow-up. Patient reports that treatment is going well. She denies any fatigue or any other symptoms. She says she is restless. It is hard for her to stay asleep.  ALLERGIES:  is allergic to pollen extract, compazine [prochlorperazine], latex, codeine, diflucan [fluconazole], shellfish allergy, and betadine [povidone iodine].  MEDICATIONS:  Current Outpatient Medications  Medication Sig Dispense Refill   amLODipine (NORVASC) 10 MG tablet Take 1 tablet (10 mg total) by mouth daily. 90 tablet 2   aspirin EC 81 MG tablet Take 1 tablet (81 mg total) by mouth daily. Swallow whole. 30 tablet 11   azithromycin (ZITHROMAX Z-PAK) 250 MG tablet Take as directed 6 each 0   benzonatate (TESSALON) 100 MG capsule Take 1 capsule (100 mg total) by mouth 3 (three) times daily as needed for cough. 30 capsule 0   cetirizine (ZYRTEC ALLERGY) 10 MG tablet Take 1 tablet (10 mg total) by mouth daily. 30 tablet 2   EDARBYCLOR 40-25 MG TABS Take 1 tablet by mouth daily. 90 tablet 2   furosemide (LASIX) 20 MG tablet Take 1 tablet (20 mg total) by mouth as needed. TAKE 1 TABLET AS NEEDED FOR SWELLING OR SHORTNESS OF BREATH OR WEIGHT GAIN. 3 POUNDS WITHIN 24HR OR 5 POUNDS 7 DAYS 90 tablet 3   levonorgestrel (MIRENA, 52 MG,) 20 MCG/DAY IUD 1 each by Intrauterine route once.     lidocaine-prilocaine (EMLA) cream Apply to affected area once AS DIRECTED 30 g 3   linaclotide (LINZESS) 72 MCG capsule Take 1 capsule (72 mcg total) by mouth daily before breakfast. 30  capsule 0   potassium chloride SA (KLOR-CON M) 10 MEQ tablet Take 4 tablets (40 mEq total) by mouth daily. 120 tablet 3   rosuvastatin (CRESTOR) 40 MG tablet Take 1 tablet (40 mg total) by mouth daily. 90 tablet 3   No current facility-administered medications for this visit.   Facility-Administered Medications Ordered in Other Visits  Medication Dose Route Frequency Provider Last Rate Last Admin   heparin lock flush 100 unit/mL  500 Units Intracatheter Once Serena Croissant, MD       sodium chloride flush (NS) 0.9 % injection 10 mL  10 mL Intracatheter Once Serena Croissant, MD        PHYSICAL EXAMINATION: ECOG PERFORMANCE STATUS: 1 - Symptomatic but completely ambulatory  Vitals:   11/09/22 1045  BP: 138/69  Pulse: 78  Resp: 18  Temp: 97.7 F (36.5 C)  SpO2: 100%   Filed Weights   11/09/22 1045  Weight: (!) 306 lb 4.8 oz (138.9 kg)      LABORATORY DATA:  I have reviewed the data as listed    Latest Ref Rng & Units 10/19/2022    9:49 AM 09/20/2022    8:09 AM 08/30/2022    9:15 AM  CMP  Glucose 70 - 99 mg/dL 95  90  96   BUN 6 - 20 mg/dL 6  8  9    Creatinine 0.44 -  1.00 mg/dL 2.44  0.10  2.72   Sodium 135 - 145 mmol/L 141  142  141   Potassium 3.5 - 5.1 mmol/L 3.4  3.4  3.4   Chloride 98 - 111 mmol/L 107  109  106   CO2 22 - 32 mmol/L 30  29  29    Calcium 8.9 - 10.3 mg/dL 8.6  9.0  9.1   Total Protein 6.5 - 8.1 g/dL 6.4  6.2  6.5   Total Bilirubin 0.3 - 1.2 mg/dL 1.4  1.4  1.3   Alkaline Phos 38 - 126 U/L 108  100  95   AST 15 - 41 U/L 34  36  24   ALT 0 - 44 U/L 18  21  10      Lab Results  Component Value Date   WBC 3.5 (L) 11/09/2022   HGB 11.0 (L) 11/09/2022   HCT 33.7 (L) 11/09/2022   MCV 88.0 11/09/2022   PLT 203 11/09/2022   NEUTROABS 1.7 11/09/2022    ASSESSMENT & PLAN:  Malignant neoplasm of lower-outer quadrant of right breast of female, estrogen receptor positive (HCC) 06/02/2020:Right lumpectomy ([facility]): invasive and in situ ductal carcinoma, 1.3cm,  clear margins, 1 right axillary lymph node negative for carcinoma. ER 15% weak, PR-,HER-2 positive (3+) Ki67 80%.   Treatment plan: 1.  Adjuvant chemotherapy with Taxol Herceptin followed by Herceptin maintenance.  Stopped after 11 cycles of Taxol Herceptin maintenance completed 07/07/2021  2.  Adjuvant radiation therapy 11/23/2020-12/19/2020 3.  Adjuvant antiestrogen therapy with tamoxifen started October 2022 4.  CT angiogram 07/13/2021: Right supraclavicular lymph node 5 cm, right internal mammary lymph nodes 1.8 cm 5.  Metastatic breast cancer: June 2023:Right supraclavicular lymphadenopathy: Biopsy: Metastatic poorly differentiated adenocarcinoma, ER +40%, PR negative, HER2 positive 3+ PET CT scan 08/06/21: Ext Right Supra clav LN along with Internal mammary and Rt Upper Mediastinal and Rt Axillary LN -----------------------------------------------------------------------------------------------------  Current treatment: Enhertu started on 08/21/2021, (this treatment on hold) today is cycle 19   Enhertu toxicities: 1,.  Severe nausea, requiring IV fluids: Better since we added Emend 2. Chemotherapy-induced anemia: Monitoring closely, hemoglobin 11.1 3.  Hospitalization 11/26/2021-11/28/2021: Dizziness and fatigue, esophageal dysmotility 4.  Hypokalemia and diarrhea: Discontinued IV fluids because it was causing her leg swelling 5.  Leukopenia: ANC 1.5: Okay to treat.  Enhertu dose reduced previously   Echocardiogram has been scheduled for June.     CT chest abdomen pelvis 06/25/2022: Stable exam.  No new or progressive findings. CT CAP 10/04/2022: Stable findings without new or progressive disease  Based on the scan report we may have to decide on whether to stop Enhertu and switch to antiestrogen therapy like tamoxifen (patient had a menstrual cycle last week) plus or minus anti-HER2 oral therapy like lapatinib.   Leg swelling: On and off   I recommended moving the treatment to every 4 weeks.      Orders Placed This Encounter  Procedures   CBC with Differential (Cancer Center Only)    Standing Status:   Future    Standing Expiration Date:   01/05/2024   CMP (Cancer Center only)    Standing Status:   Future    Standing Expiration Date:   01/05/2024   CBC with Differential (Cancer Center Only)    Standing Status:   Future    Standing Expiration Date:   02/02/2024   CMP (Cancer Center only)    Standing Status:   Future    Standing Expiration Date:   02/02/2024  CBC with Differential (Cancer Center Only)    Standing Status:   Future    Standing Expiration Date:   03/01/2024   CMP (Cancer Center only)    Standing Status:   Future    Standing Expiration Date:   03/01/2024   CBC with Differential (Cancer Center Only)    Standing Status:   Future    Standing Expiration Date:   03/29/2024   CMP (Cancer Center only)    Standing Status:   Future    Standing Expiration Date:   03/29/2024   CBC with Differential (Cancer Center Only)    Standing Status:   Future    Standing Expiration Date:   04/26/2024   CMP (Cancer Center only)    Standing Status:   Future    Standing Expiration Date:   04/26/2024   CBC with Differential (Cancer Center Only)    Standing Status:   Future    Standing Expiration Date:   05/24/2024   CMP (Cancer Center only)    Standing Status:   Future    Standing Expiration Date:   05/24/2024   CBC with Differential (Cancer Center Only)    Standing Status:   Future    Standing Expiration Date:   06/21/2024   CMP (Cancer Center only)    Standing Status:   Future    Standing Expiration Date:   06/21/2024   CBC with Differential (Cancer Center Only)    Standing Status:   Future    Standing Expiration Date:   07/19/2024   CMP (Cancer Center only)    Standing Status:   Future    Standing Expiration Date:   07/19/2024   The patient has a good understanding of the overall plan. she agrees with it. she will call with any problems that may develop before the next visit  here. Total time spent: 30 mins including face to face time and time spent for planning, charting and co-ordination of care   Tamsen Meek, MD 11/09/22    I Janan Ridge am acting as a Neurosurgeon for [employer]  I have reviewed the above documentation for accuracy and completeness, and I agree with the above.

## 2022-11-09 NOTE — Progress Notes (Signed)
Per Dr Pamelia Hoit, ok to proceed with bili 1.6 mg/dL

## 2022-11-09 NOTE — Patient Instructions (Signed)
[place] [facility]  Discharge Instructions: Thank you for choosing [facility] to provide your oncology and hematology care.   If you have a lab appointment with the Cancer Center, please go directly to the Cancer Center and check in at the registration area.   Wear comfortable clothing and clothing appropriate for easy access to any Portacath or PICC line.   We strive to give you quality time with your provider. You may need to reschedule your appointment if you arrive late (15 or more minutes).  Arriving late affects you and other patients whose appointments are after yours.  Also, if you miss three or more appointments without notifying the office, you may be dismissed from the clinic at the provider's discretion.      For prescription refill requests, have your pharmacy contact our office and allow 72 hours for refills to be completed.    Today you received the following chemotherapy and/or immunotherapy agents Enhertu.   To help prevent nausea and vomiting after your treatment, we encourage you to take your nausea medication as directed.  BELOW ARE SYMPTOMS THAT SHOULD BE REPORTED IMMEDIATELY: *FEVER GREATER THAN 100.4 F (38 C) OR HIGHER *CHILLS OR SWEATING *NAUSEA AND VOMITING THAT IS NOT CONTROLLED WITH YOUR NAUSEA MEDICATION *UNUSUAL SHORTNESS OF BREATH *UNUSUAL BRUISING OR BLEEDING *URINARY PROBLEMS (pain or burning when urinating, or frequent urination) *BOWEL PROBLEMS (unusual diarrhea, constipation, pain near the anus) TENDERNESS IN MOUTH AND THROAT WITH OR WITHOUT PRESENCE OF ULCERS (sore throat, sores in mouth, or a toothache) UNUSUAL RASH, SWELLING OR PAIN  UNUSUAL VAGINAL DISCHARGE OR ITCHING   Items with * indicate a potential emergency and should be followed up as soon as possible or go to the Emergency Department if any problems should occur.  Please show the CHEMOTHERAPY ALERT CARD or IMMUNOTHERAPY ALERT CARD at check-in  to the Emergency Department and triage nurse.  Should you have questions after your visit or need to cancel or reschedule your appointment, please contact [facility]  Dept: [phone]  and follow the prompts.  Office hours are 8:00 a.m. to 4:30 p.m. Monday - Friday. Please note that voicemails left after 4:00 p.m. may not be returned until the following business day.  We are closed weekends and major holidays. You have access to a nurse at all times for urgent questions. Please call the main number to the clinic Dept: [phone] and follow the prompts.   For any non-urgent questions, you may also contact your provider using MyChart. We now offer e-Visits for anyone 18 and older to request care online for non-urgent symptoms. For details visit [email or web address].   Also download the MyChart app! Go to the app store, search "MyChart", open the app, select [facility], and log in with your MyChart username and password.   

## 2022-11-09 NOTE — Assessment & Plan Note (Addendum)
06/02/2020:Right lumpectomy ([facility]): invasive and in situ ductal carcinoma, 1.3cm, clear margins, 1 right axillary lymph node negative for carcinoma. ER 15% weak, PR-,HER-2 positive (3+) Ki67 80%.   Treatment plan: 1.  Adjuvant chemotherapy with Taxol Herceptin followed by Herceptin maintenance.  Stopped after 11 cycles of Taxol Herceptin maintenance completed 07/07/2021  2.  Adjuvant radiation therapy 11/23/2020-12/19/2020 3.  Adjuvant antiestrogen therapy with tamoxifen started October 2022 4.  CT angiogram 07/13/2021: Right supraclavicular lymph node 5 cm, right internal mammary lymph nodes 1.8 cm 5.  Metastatic breast cancer: June 2023:Right supraclavicular lymphadenopathy: Biopsy: Metastatic poorly differentiated adenocarcinoma, ER +40%, PR negative, HER2 positive 3+ PET CT scan 08/06/21: Ext Right Supra clav LN along with Internal mammary and Rt Upper Mediastinal and Rt Axillary LN -----------------------------------------------------------------------------------------------------  Current treatment: Enhertu started on 08/21/2021, (this treatment on hold) today is cycle 19   Enhertu toxicities: 1,.  Severe nausea, requiring IV fluids: Better since we added Emend 2. Chemotherapy-induced anemia: Monitoring closely, hemoglobin 11.1 3.  Hospitalization 11/26/2021-11/28/2021: Dizziness and fatigue, esophageal dysmotility 4.  Hypokalemia and diarrhea: Discontinued IV fluids because it was causing her leg swelling 5.  Leukopenia: ANC 1.5: Okay to treat.  Enhertu dose reduced previously   Echocardiogram has been scheduled for June.     CT chest abdomen pelvis 06/25/2022: Stable exam.  No new or progressive findings. CT CAP 10/04/2022: Stable findings without new or progressive disease  Based on the scan report we may have to decide on whether to stop Enhertu and switch to antiestrogen therapy like tamoxifen (patient had a menstrual cycle last week) plus or minus anti-HER2 oral therapy like lapatinib.    Leg swelling: On and off   I recommended moving the treatment to every 4 weeks.

## 2022-11-12 ENCOUNTER — Telehealth: Payer: Self-pay

## 2022-11-12 NOTE — Telephone Encounter (Signed)
Spoke to patient informing her that her [employer] FMLA documents had been completed and faxed to the company. Fax conformation received. Copy of documents emailed to patient as requested.

## 2022-11-14 ENCOUNTER — Encounter: Payer: Self-pay | Admitting: Internal Medicine

## 2022-11-14 ENCOUNTER — Ambulatory Visit: Payer: 59 | Admitting: Internal Medicine

## 2022-11-14 VITALS — BP 130/80 | HR 62 | Temp 98.5°F | Ht 69.0 in | Wt 307.8 lb

## 2022-11-14 DIAGNOSIS — K5903 Drug induced constipation: Secondary | ICD-10-CM

## 2022-11-14 DIAGNOSIS — I119 Hypertensive heart disease without heart failure: Secondary | ICD-10-CM

## 2022-11-14 DIAGNOSIS — I10 Essential (primary) hypertension: Secondary | ICD-10-CM

## 2022-11-14 DIAGNOSIS — Z170 Estrogen receptor positive status [ER+]: Secondary | ICD-10-CM

## 2022-11-14 DIAGNOSIS — C50511 Malignant neoplasm of lower-outer quadrant of right female breast: Secondary | ICD-10-CM

## 2022-11-14 DIAGNOSIS — E7800 Pure hypercholesterolemia, unspecified: Secondary | ICD-10-CM

## 2022-11-14 DIAGNOSIS — I700 Atherosclerosis of aorta: Secondary | ICD-10-CM | POA: Diagnosis not present

## 2022-11-14 DIAGNOSIS — Z6841 Body Mass Index (BMI) 40.0 and over, adult: Secondary | ICD-10-CM

## 2022-11-14 MED ORDER — LINACLOTIDE 72 MCG PO CAPS: 72.0000 ug | ORAL_CAPSULE | Freq: Every day | ORAL | 1 refills | Status: DC

## 2022-11-14 MED ORDER — AMLODIPINE BESYLATE 10 MG PO TABS
10.0000 mg | ORAL_TABLET | Freq: Every day | ORAL | 2 refills | Status: DC
Start: 2022-11-14 — End: 2023-01-30

## 2022-11-14 MED ORDER — EDARBYCLOR 40-25 MG PO TABS
1.0000 | ORAL_TABLET | Freq: Every day | ORAL | 2 refills | Status: DC
Start: 2022-11-14 — End: 2023-01-08

## 2022-11-14 NOTE — Progress Notes (Signed)
I,[name], CMA,acting as a Neurosurgeon for [name], MD.,have documented all relevant documentation on the behalf of [name], MD,as directed by  [name], MD while in the presence of [name], MD.  Subjective:  Patient ID: Pamela [name] , female    DOB: Sep 19, 1965 , 57 y.o.   MRN: [id number]  Chief Complaint  Patient presents with   Hypertension    HPI  The patient is here today for a blood pressure f/u.  She reports compliance with meds. She denies headache, chest pain, dizziness, and SOB. She has upcoming appointment with GYN, Dr. Su Hilt. She denies having any specific questions or concerns.   Hypertension This is a chronic problem. The current episode started more than 1 year ago. The problem has been gradually improving since onset. The problem is controlled. There are no associated agents to hypertension. Risk factors for coronary artery disease include obesity and sedentary lifestyle. Past treatments include diuretics and angiotensin blockers. There are no compliance problems.  There is no history of angina.     Past Medical History:  Diagnosis Date   Anxiety    Asthma    Back pain    Breast cancer (HCC)    Constipation    DUB (dysfunctional uterine bleeding) 2007   Edema of both lower extremities    Elevated cholesterol    Family history of breast cancer 05/12/2020   Family history of lung cancer 05/12/2020   Food allergy    H/O menorrhagia 05/01/2006   High cholesterol    History of ovarian cyst 2007   Hypertension 03/31/2005   Increased BMI 07/11/2006   Joint pain    Migraine    N&V (nausea and vomiting)    Obesity 2007   Obstructive sleep apnea syndrome, moderate 10/27/2013   no cpap   Personal history of chemotherapy    Personal history of radiation therapy    Sleep apnea    SOB (shortness of breath)    Vitamin D deficiency    Vitamin D deficiency    Weight loss 07/11/2006     Family History  Problem Relation Age  of Onset   Hypertension Mother    High Cholesterol Mother    Thyroid disease Mother    Cancer Mother    Sleep apnea Mother    Hypertension Father    Heart attack Father    Sudden death Father    Diabetes Maternal Grandmother    Bone cancer Maternal Grandfather        dx after 34   Breast cancer Maternal Aunt        dx 30s   Lung cancer Maternal Aunt        dx after 50     Current Outpatient Medications:    aspirin EC 81 MG tablet, Take 1 tablet (81 mg total) by mouth daily. Swallow whole., Disp: 30 tablet, Rfl: 11   cetirizine (ZYRTEC ALLERGY) 10 MG tablet, Take 1 tablet (10 mg total) by mouth daily., Disp: 30 tablet, Rfl: 2   levonorgestrel (MIRENA, 52 MG,) 20 MCG/DAY IUD, 1 each by Intrauterine route once., Disp: , Rfl:    lidocaine-prilocaine (EMLA) cream, Apply to affected area once AS DIRECTED, Disp: 30 g, Rfl: 3   potassium chloride SA (KLOR-CON M) 10 MEQ tablet, Take 4 tablets (40 mEq total) by mouth daily., Disp: 120 tablet, Rfl: 3   rosuvastatin (CRESTOR) 40 MG tablet, Take 1 tablet (40 mg total) by mouth daily., Disp:  90 tablet, Rfl: 3   amLODipine (NORVASC) 10 MG tablet, Take 1 tablet (10 mg total) by mouth daily., Disp: 90 tablet, Rfl: 2   Azilsartan-Chlorthalidone (EDARBYCLOR) 40-25 MG TABS, Take 1 tablet by mouth daily., Disp: 30 tablet, Rfl: 2   furosemide (LASIX) 20 MG tablet, Take 1 tablet (20 mg total) by mouth as needed. TAKE 1 TABLET AS NEEDED FOR SWELLING OR SHORTNESS OF BREATH OR WEIGHT GAIN. 3 POUNDS WITHIN 24HR OR 5 POUNDS 7 DAYS (Patient not taking: Reported on 11/14/2022), Disp: 90 tablet, Rfl: 3   linaclotide (LINZESS) 72 MCG capsule, Take 1 capsule (72 mcg total) by mouth daily before breakfast., Disp: 90 capsule, Rfl: 1 No current facility-administered medications for this visit.  Facility-Administered Medications Ordered in Other Visits:    heparin lock flush 100 unit/mL, 500 Units, Intracatheter, Once, Gudena, Vinay, MD   sodium chloride flush (NS) 0.9  % injection 10 mL, 10 mL, Intracatheter, Once, Serena Croissant, MD   Allergies  Allergen Reactions   Pollen Extract Shortness Of Breath   Compazine [Prochlorperazine] Anxiety    IV compazine causes severe anxiety attack   Latex Rash   Codeine Hives   Diflucan [Fluconazole] Hives   Shellfish Allergy Hives   Betadine [Povidone Iodine] Rash     Review of Systems  Constitutional: Negative.   Respiratory: Negative.    Cardiovascular: Negative.   Gastrointestinal: Negative.   Musculoskeletal: Negative.   Neurological: Negative.   Psychiatric/Behavioral: Negative.       Today's Vitals   11/14/22 0935  BP: 130/80  Pulse: 62  Temp: 98.5 F (36.9 C)  SpO2: 97%  Weight: (!) 307 lb 12.8 oz (139.6 kg)  Height: 5\' 9"  (1.753 m)   Body mass index is 45.45 kg/m.  Wt Readings from Last 3 Encounters:  11/14/22 (!) 307 lb 12.8 oz (139.6 kg)  11/09/22 (!) 306 lb 4.8 oz (138.9 kg)  10/19/22 (!) 306 lb (138.8 kg)     Objective:  Physical Exam Vitals and nursing note reviewed.  Constitutional:      Appearance: Normal appearance. She is obese.  HENT:     Head: Normocephalic and atraumatic.  Eyes:     Extraocular Movements: Extraocular movements intact.  Cardiovascular:     Rate and Rhythm: Normal rate and regular rhythm.     Heart sounds: Normal heart sounds.  Pulmonary:     Effort: Pulmonary effort is normal.     Breath sounds: Normal breath sounds.  Musculoskeletal:     Cervical back: Normal range of motion.  Skin:    General: Skin is warm.  Neurological:     General: No focal deficit present.     Mental Status: She is alert.  Psychiatric:        Mood and Affect: Mood normal.        Behavior: Behavior normal.         Assessment And Plan:  Hypertensive heart disease without heart failure Assessment & Plan: Chronic, fair control.  She will continue with amlodipine and Edarbyclor. Encouraged to follow low sodium diet. She will f/u in four to six months for re-evaluation.  Encouraged to try to incorporate more exercise into her daily routine.   Orders: -     amLODIPine Besylate; Take 1 tablet (10 mg total) by mouth daily.  Dispense: 90 tablet; Refill: 2 -     Edarbyclor; Take 1 tablet by mouth daily.  Dispense: 30 tablet; Refill: 2  Aortic atherosclerosis (HCC) Assessment & Plan: Chronic, LDL  goal is less than 70.  She will continue with rosuvastatin daily.    Pure hypercholesterolemia Assessment & Plan: Chronic, she is currently on statin therapy. She will continue with rosuvastatin 40mg  daily. Encouraged to follow heart healthy lifestyle.    Malignant neoplasm of lower-outer quadrant of right breast of female, estrogen receptor positive (HCC) Assessment & Plan: Chronic, initially s/p R lumpectomy, chemo and XRT.  Diagnosed with metastatic breast cancer in 2023. Currently being treated with Enhurtu.    Drug-induced constipation Assessment & Plan: Chronic, improved with Linzess. She was given rx Linzess 72mg  daily. Encouraged to stay well hydrated and maintain adequate fiber intake.    Class 3 severe obesity due to excess calories with serious comorbidity and body mass index (BMI) of 45.0 to 49.9 in adult [facility])  Other orders -     linaCLOtide; Take 1 capsule (72 mcg total) by mouth daily before breakfast.  Dispense: 90 capsule; Refill: 1  She is encouraged to strive for BMI less than 30 to decrease cardiac risk. Advised to aim for at least 150 minutes of exercise per week.    Return if symptoms worsen or fail to improve.  Patient was given opportunity to ask questions. Patient verbalized understanding of the plan and was able to repeat key elements of the plan. All questions were answered to their satisfaction.    I, [name], MD, have reviewed all documentation for this visit. The documentation on 11/14/22 for the exam, diagnosis, procedures, and orders are all accurate and complete.   IF YOU HAVE BEEN REFERRED TO A SPECIALIST, IT MAY  TAKE 1-2 WEEKS TO SCHEDULE/PROCESS THE REFERRAL. IF YOU HAVE NOT HEARD FROM US/SPECIALIST IN TWO WEEKS, PLEASE GIVE [place] A CALL AT [phone].   THE PATIENT IS ENCOURAGED TO PRACTICE SOCIAL DISTANCING DUE TO THE COVID-19 PANDEMIC.

## 2022-11-14 NOTE — Patient Instructions (Signed)
Hypertension, Adult Hypertension is another name for high blood pressure. High blood pressure forces your heart to work harder to pump blood. This can cause problems over time. There are two numbers in a blood pressure reading. There is a top number (systolic) over a bottom number (diastolic). It is best to have a blood pressure that is below 120/80. What are the causes? The cause of this condition is not known. Some other conditions can lead to high blood pressure. What increases the risk? Some lifestyle factors can make you more likely to develop high blood pressure: Smoking. Not getting enough exercise or physical activity. Being overweight. Having too much fat, sugar, calories, or salt (sodium) in your diet. Drinking too much alcohol. Other risk factors include: Having any of these conditions: Heart disease. Diabetes. High cholesterol. Kidney disease. Obstructive sleep apnea. Having a family history of high blood pressure and high cholesterol. Age. The risk increases with age. Stress. What are the signs or symptoms? High blood pressure may not cause symptoms. Very high blood pressure (hypertensive crisis) may cause: Headache. Fast or uneven heartbeats (palpitations). Shortness of breath. Nosebleed. Vomiting or feeling like you may vomit (nauseous). Changes in how you see. Very bad chest pain. Feeling dizzy. Seizures. How is this treated? This condition is treated by making healthy lifestyle changes, such as: Eating healthy foods. Exercising more. Drinking less alcohol. Your doctor may prescribe medicine if lifestyle changes do not help enough and if: Your top number is above 130. Your bottom number is above 80. Your personal target blood pressure may vary. Follow these instructions at home: Eating and drinking  If told, follow the DASH eating plan. To follow this plan: Fill one half of your plate at each meal with fruits and vegetables. Fill one fourth of your plate  at each meal with whole grains. Whole grains include whole-wheat pasta, brown rice, and whole-grain bread. Eat or drink low-fat dairy products, such as skim milk or low-fat yogurt. Fill one fourth of your plate at each meal with low-fat (lean) proteins. Low-fat proteins include fish, chicken without skin, eggs, beans, and tofu. Avoid fatty meat, cured and processed meat, or chicken with skin. Avoid pre-made or processed food. Limit the amount of salt in your diet to less than 1,500 mg each day. Do not drink alcohol if: Your doctor tells you not to drink. You are pregnant, may be pregnant, or are planning to become pregnant. If you drink alcohol: Limit how much you have to: 0-1 drink a day for women. 0-2 drinks a day for men. Know how much alcohol is in your drink. In the [place] one drink equals one 12 oz bottle of beer (355 mL), one 5 oz glass of wine (148 mL), or one 1 oz glass of hard liquor (44 mL). Lifestyle  Work with your doctor to stay at a healthy weight or to lose weight. Ask your doctor what the best weight is for you. Get at least 30 minutes of exercise that causes your heart to beat faster (aerobic exercise) most days of the week. This may include walking, swimming, or biking. Get at least 30 minutes of exercise that strengthens your muscles (resistance exercise) at least 3 days a week. This may include lifting weights or doing Pilates. Do not smoke or use any products that contain nicotine or tobacco. If you need help quitting, ask your doctor. Check your blood pressure at home as told by your doctor. Keep all follow-up visits. Medicines Take over-the-counter and prescription medicines   only as told by your doctor. Follow directions carefully. Do not skip doses of blood pressure medicine. The medicine does not work as well if you skip doses. Skipping doses also puts you at risk for problems. Ask your doctor about side effects or reactions to medicines that you should watch  for. Contact a doctor if: You think you are having a reaction to the medicine you are taking. You have headaches that keep coming back. You feel dizzy. You have swelling in your ankles. You have trouble with your vision. Get help right away if: You get a very bad headache. You start to feel mixed up (confused). You feel weak or numb. You feel faint. You have very bad pain in your: Chest. Belly (abdomen). You vomit more than once. You have trouble breathing. These symptoms may be an emergency. Get help right away. Call 911. Do not wait to see if the symptoms will go away. Do not drive yourself to the hospital. Summary Hypertension is another name for high blood pressure. High blood pressure forces your heart to work harder to pump blood. For most people, a normal blood pressure is less than 120/80. Making healthy choices can help lower blood pressure. If your blood pressure does not get lower with healthy choices, you may need to take medicine. This information is not intended to replace advice given to you by your health care provider. Make sure you discuss any questions you have with your health care provider. Document Revised: 12/08/2020 Document Reviewed: 12/08/2020 Elsevier Patient Education  2024 Else[employer] Inc.  

## 2022-11-15 IMAGING — PT NM PET TUM IMG INITIAL (PI) SKULL BASE T - THIGH
1 of 7 series · 1 of 25 positions shown · non-contrast
Comparison: Chest CT 07/13/2021

CLINICAL DATA: Initial treatment strategy for metastatic breast
cancer.

EXAM:
NUCLEAR MEDICINE PET SKULL BASE TO THIGH
TECHNIQUE: 15.3 mCi F-18 FDG was injected intravenously. Full-ring PET imaging
was performed from the skull base to thigh after the radiotracer. CT
data was obtained and used for attenuation correction and anatomic
localization.
Fasting blood glucose: 118 mg/dl

[Series 4: ct sk_thigh 5.0 br38 · axial · 5.0mm · 0.98mm/px · 1 of 235 slices shown]
[im 235/235  brain]
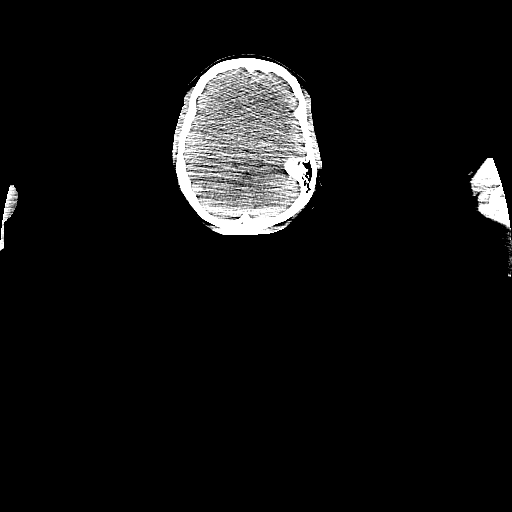

[1 of 25 positions shown; findings below may reference images not displayed]

FINDINGS: Mediastinal blood pool activity: SUV max

Liver activity: SUV max NA

NECK: No neck mass or upper cervical lymphadenopathy. There is bulky
hypermetabolic right supraclavicular adenopathy.

Incidental CT findings: none

CHEST: Surgical changes involving the right lateral breast likely
related to recent lumpectomy. SUV max is 2.65. Second focus of
surgical change in the medial/central right breast has an SUV max of
2.37.

5.3 x 3.6 cm hypermetabolic supraclavicular nodal mass has an SUV
max of 13.20. There are also adjacent hypermetabolic nodes laterally
and superiorly with SUV max of 9.88.

Right subclavicular/deep axillary node measures 10 mm and SUV max is
14.95. Right high mediastinal node on image [DATE] measures 16 mm and
SUV max is 11.70. Right internal mammary node has an SUV max of
11.19.

No enlarged or hypermetabolic hilar nodes and no hypermetabolic
pulmonary lesions or pulmonary nodules to suggest pulmonary
metastatic disease.

Incidental CT findings: Right-sided Port-A-Cath is in good position.
No complicating features. The heart is borderline enlarged but
stable.

ABDOMEN/PELVIS: No abnormal hypermetabolic activity within the
liver, pancreas, adrenal glands, or spleen. No hypermetabolic lymph
nodes in the abdomen or pelvis.

Incidental CT findings: Scattered aortic and iliac artery
calcifications but no aneurysm. An IUD is noted in the endometrial
canal.

SKELETON: No findings for osseous metastatic disease.

Incidental CT findings: No worrisome lytic or sclerotic bone
lesions. Advanced facet disease is noted in the lower lumbar spine.
Moderate bilateral hip joint degenerative changes for age.
IMPRESSION: 1. Surgical changes involving the right breast but no residual
hypermetabolic tumor is identified.
2. Extensive hypermetabolic right-sided supraclavicular adenopathy
along with right internal mammary, right upper mediastinal and right
axillary adenopathy.
3. No findings to suggest pulmonary metastatic disease,
abdominal/pelvic metastatic disease or osseous metastatic disease.

## 2022-11-22 ENCOUNTER — Inpatient Hospital Stay: Payer: 59

## 2022-11-22 NOTE — Progress Notes (Signed)
[facility] CSW Progress Note  Patient left CSW vm inquiring about financial assistance. CSW and patient reviewed treatment and applications previous submitted (alight, SGK,and PF).   Marguerita Merles, LCSWA Clinical Social Worker [facility]

## 2022-11-24 DIAGNOSIS — K5903 Drug induced constipation: Secondary | ICD-10-CM | POA: Insufficient documentation

## 2022-11-24 NOTE — Assessment & Plan Note (Signed)
Chronic, fair control.  She will continue with amlodipine and Edarbyclor. Encouraged to follow low sodium diet. She will f/u in four to six months for re-evaluation. Encouraged to try to incorporate more exercise into her daily routine.

## 2022-11-24 NOTE — Assessment & Plan Note (Addendum)
Chronic, initially s/p R lumpectomy, chemo and XRT.  Diagnosed with metastatic breast cancer in 2023. Currently being treated with Enhurtu.

## 2022-11-24 NOTE — Assessment & Plan Note (Signed)
Chronic, improved with Linzess. She was given rx Linzess 72mg  daily. Encouraged to stay well hydrated and maintain adequate fiber intake.

## 2022-11-24 NOTE — Assessment & Plan Note (Signed)
Chronic, LDL goal is less than 70.  She will continue with rosuvastatin daily.

## 2022-11-24 NOTE — Assessment & Plan Note (Signed)
Chronic, she is currently on statin therapy. She will continue with rosuvastatin 40mg  daily. Encouraged to follow heart healthy lifestyle.

## 2022-11-26 ENCOUNTER — Inpatient Hospital Stay: Payer: 59

## 2022-11-26 NOTE — Progress Notes (Signed)
[facility] CSW Progress Note  Clinical Child psychotherapist returned patient's missed call / vm to continue assistance with financial grants. CSW sent patient email with grants (patient provided verbal agreement), patient will return requested information to CSW for grant completion.   Marguerita Merles, LCSWA Clinical Social Worker [facility]

## 2022-12-03 ENCOUNTER — Inpatient Hospital Stay: Payer: 59

## 2022-12-03 ENCOUNTER — Other Ambulatory Visit: Payer: Self-pay | Admitting: Hematology and Oncology

## 2022-12-03 DIAGNOSIS — Z170 Estrogen receptor positive status [ER+]: Secondary | ICD-10-CM

## 2022-12-03 NOTE — Progress Notes (Signed)
[facility] CSW Progress Note  Visual merchandiser completed and submitted a Sport and exercise psychologist on patient's behalf to the [employer]. Patient has been notified of submission.   Marguerita Merles, LCSWA Clinical Social Worker [facility]

## 2022-12-06 MED FILL — Fosaprepitant Dimeglumine For IV Infusion 150 MG (Base Eq): INTRAVENOUS | Qty: 5 | Status: AC

## 2022-12-07 ENCOUNTER — Inpatient Hospital Stay: Payer: 59

## 2022-12-07 ENCOUNTER — Inpatient Hospital Stay: Payer: 59 | Attending: Hematology and Oncology | Admitting: Hematology and Oncology

## 2022-12-07 VITALS — BP 172/74 | HR 88 | Temp 97.2°F | Resp 18 | Ht 69.0 in | Wt 305.8 lb

## 2022-12-07 DIAGNOSIS — C50919 Malignant neoplasm of unspecified site of unspecified female breast: Secondary | ICD-10-CM

## 2022-12-07 DIAGNOSIS — N6315 Unspecified lump in the right breast, overlapping quadrants: Secondary | ICD-10-CM | POA: Diagnosis not present

## 2022-12-07 DIAGNOSIS — Z888 Allergy status to other drugs, medicaments and biological substances status: Secondary | ICD-10-CM | POA: Diagnosis not present

## 2022-12-07 DIAGNOSIS — Z1722 Progesterone receptor negative status: Secondary | ICD-10-CM | POA: Insufficient documentation

## 2022-12-07 DIAGNOSIS — R110 Nausea: Secondary | ICD-10-CM | POA: Insufficient documentation

## 2022-12-07 DIAGNOSIS — I517 Cardiomegaly: Secondary | ICD-10-CM | POA: Insufficient documentation

## 2022-12-07 DIAGNOSIS — D72819 Decreased white blood cell count, unspecified: Secondary | ICD-10-CM | POA: Insufficient documentation

## 2022-12-07 DIAGNOSIS — Z79899 Other long term (current) drug therapy: Secondary | ICD-10-CM | POA: Diagnosis not present

## 2022-12-07 DIAGNOSIS — Z170 Estrogen receptor positive status [ER+]: Secondary | ICD-10-CM | POA: Insufficient documentation

## 2022-12-07 DIAGNOSIS — T451X5A Adverse effect of antineoplastic and immunosuppressive drugs, initial encounter: Secondary | ICD-10-CM | POA: Insufficient documentation

## 2022-12-07 DIAGNOSIS — M7989 Other specified soft tissue disorders: Secondary | ICD-10-CM | POA: Diagnosis not present

## 2022-12-07 DIAGNOSIS — Z883 Allergy status to other anti-infective agents status: Secondary | ICD-10-CM | POA: Diagnosis not present

## 2022-12-07 DIAGNOSIS — I700 Atherosclerosis of aorta: Secondary | ICD-10-CM | POA: Diagnosis not present

## 2022-12-07 DIAGNOSIS — G620 Drug-induced polyneuropathy: Secondary | ICD-10-CM | POA: Diagnosis not present

## 2022-12-07 DIAGNOSIS — Z95828 Presence of other vascular implants and grafts: Secondary | ICD-10-CM

## 2022-12-07 DIAGNOSIS — D6481 Anemia due to antineoplastic chemotherapy: Secondary | ICD-10-CM | POA: Diagnosis not present

## 2022-12-07 DIAGNOSIS — E876 Hypokalemia: Secondary | ICD-10-CM | POA: Diagnosis not present

## 2022-12-07 DIAGNOSIS — R202 Paresthesia of skin: Secondary | ICD-10-CM | POA: Insufficient documentation

## 2022-12-07 DIAGNOSIS — C50511 Malignant neoplasm of lower-outer quadrant of right female breast: Secondary | ICD-10-CM | POA: Insufficient documentation

## 2022-12-07 DIAGNOSIS — Z5112 Encounter for antineoplastic immunotherapy: Secondary | ICD-10-CM | POA: Insufficient documentation

## 2022-12-07 DIAGNOSIS — Z885 Allergy status to narcotic agent status: Secondary | ICD-10-CM | POA: Insufficient documentation

## 2022-12-07 DIAGNOSIS — R5383 Other fatigue: Secondary | ICD-10-CM | POA: Diagnosis not present

## 2022-12-07 LAB — CBC WITH DIFFERENTIAL (CANCER CENTER ONLY)
Abs Immature Granulocytes: 0.01 10*3/uL (ref 0.00–0.07)
Basophils Absolute: 0 10*3/uL (ref 0.0–0.1)
Basophils Relative: 1 %
Eosinophils Absolute: 0.2 10*3/uL (ref 0.0–0.5)
Eosinophils Relative: 6 %
HCT: 34.5 % — ABNORMAL LOW (ref 36.0–46.0)
Hemoglobin: 11.3 g/dL — ABNORMAL LOW (ref 12.0–15.0)
Immature Granulocytes: 0 %
Lymphocytes Relative: 30 %
Lymphs Abs: 0.9 10*3/uL (ref 0.7–4.0)
MCH: 28.7 pg (ref 26.0–34.0)
MCHC: 32.8 g/dL (ref 30.0–36.0)
MCV: 87.6 fL (ref 80.0–100.0)
Monocytes Absolute: 0.5 10*3/uL (ref 0.1–1.0)
Monocytes Relative: 16 %
Neutro Abs: 1.4 10*3/uL — ABNORMAL LOW (ref 1.7–7.7)
Neutrophils Relative %: 47 %
Platelet Count: 151 10*3/uL (ref 150–400)
RBC: 3.94 MIL/uL (ref 3.87–5.11)
RDW: 15.1 % (ref 11.5–15.5)
WBC Count: 3 10*3/uL — ABNORMAL LOW (ref 4.0–10.5)
nRBC: 0 % (ref 0.0–0.2)

## 2022-12-07 LAB — CMP (CANCER CENTER ONLY)
ALT: 14 U/L (ref 0–44)
AST: 26 U/L (ref 15–41)
Albumin: 3.4 g/dL — ABNORMAL LOW (ref 3.5–5.0)
Alkaline Phosphatase: 106 U/L (ref 38–126)
Anion gap: 5 (ref 5–15)
BUN: 9 mg/dL (ref 6–20)
CO2: 29 mmol/L (ref 22–32)
Calcium: 9.1 mg/dL (ref 8.9–10.3)
Chloride: 108 mmol/L (ref 98–111)
Creatinine: 0.64 mg/dL (ref 0.44–1.00)
GFR, Estimated: >60 mL/min (ref >60)
Glucose, Bld: 97 mg/dL (ref 70–99)
Potassium: 3.4 mmol/L — ABNORMAL LOW (ref 3.5–5.1)
Sodium: 142 mmol/L (ref 135–145)
Total Bilirubin: 1.5 mg/dL — ABNORMAL HIGH (ref 0.3–1.2)
Total Protein: 6.3 g/dL — ABNORMAL LOW (ref 6.5–8.1)

## 2022-12-07 MED ORDER — DEXTROSE 5 % IV SOLN: Freq: Once | INTRAVENOUS | Status: AC

## 2022-12-07 MED ORDER — ACETAMINOPHEN 325 MG PO TABS
650.0000 mg | ORAL_TABLET | Freq: Once | ORAL | Status: AC
  Administered 2022-12-07: 650 mg via ORAL
  Filled 2022-12-07: qty 2

## 2022-12-07 MED ORDER — SODIUM CHLORIDE 0.9 % IV SOLN: Freq: Once | INTRAVENOUS | Status: AC

## 2022-12-07 MED ORDER — CYANOCOBALAMIN 1000 MCG/ML IJ SOLN
1000.0000 ug | Freq: Once | INTRAMUSCULAR | Status: AC
  Administered 2022-12-07: 1000 ug via INTRAMUSCULAR
  Filled 2022-12-07: qty 1

## 2022-12-07 MED ORDER — FAM-TRASTUZUMAB DERUXTECAN-NXKI CHEMO 100 MG IV SOLR
300.0000 mg | Freq: Once | INTRAVENOUS | Status: AC
  Administered 2022-12-07: 300 mg via INTRAVENOUS
  Filled 2022-12-07: qty 15

## 2022-12-07 MED ORDER — SODIUM CHLORIDE 0.9% FLUSH
10.0000 mL | INTRAVENOUS | Status: DC | PRN
  Administered 2022-12-07 (×2): 10 mL

## 2022-12-07 MED ORDER — PALONOSETRON HCL INJECTION 0.25 MG/5ML
0.2500 mg | Freq: Once | INTRAVENOUS | Status: AC
  Administered 2022-12-07: 0.25 mg via INTRAVENOUS
  Filled 2022-12-07: qty 5

## 2022-12-07 MED ORDER — SODIUM CHLORIDE 0.9 % IV SOLN
150.0000 mg | Freq: Once | INTRAVENOUS | Status: AC
  Administered 2022-12-07: 150 mg via INTRAVENOUS
  Filled 2022-12-07: qty 150

## 2022-12-07 MED ORDER — SODIUM CHLORIDE 0.9% FLUSH
10.0000 mL | Freq: Once | INTRAVENOUS | Status: AC
  Administered 2022-12-07: 10 mL

## 2022-12-07 MED ORDER — HEPARIN SOD (PORK) LOCK FLUSH 100 UNIT/ML IV SOLN
500.0000 [IU] | Freq: Once | INTRAVENOUS | Status: AC | PRN
  Administered 2022-12-07: 500 [IU]

## 2022-12-07 MED ORDER — POTASSIUM CHLORIDE 10 MEQ/100ML IV SOLN
10.0000 meq | Freq: Once | INTRAVENOUS | Status: AC
  Administered 2022-12-07: 10 meq via INTRAVENOUS
  Filled 2022-12-07: qty 100

## 2022-12-07 NOTE — Progress Notes (Signed)
Per Pamelia Hoit, MD, okay to proceed with txt with Echo from 08/21/22 and Neutrophils 1.4

## 2022-12-07 NOTE — Assessment & Plan Note (Signed)
06/02/2020:Right lumpectomy ([facility]): invasive and in situ ductal carcinoma, 1.3cm, clear margins, 1 right axillary lymph node negative for carcinoma. ER 15% weak, PR-,HER-2 positive (3+) Ki67 80%.   Treatment plan: 1.  Adjuvant chemotherapy with Taxol Herceptin followed by Herceptin maintenance.  Stopped after 11 cycles of Taxol Herceptin maintenance completed 07/07/2021  2.  Adjuvant radiation therapy 11/23/2020-12/19/2020 3.  Adjuvant antiestrogen therapy with tamoxifen started October 2022 4.  CT angiogram 07/13/2021: Right supraclavicular lymph node 5 cm, right internal mammary lymph nodes 1.8 cm 5.  Metastatic breast cancer: June 2023:Right supraclavicular lymphadenopathy: Biopsy: Metastatic poorly differentiated adenocarcinoma, ER +40%, PR negative, HER2 positive 3+ PET CT scan 08/06/21: Ext Right Supra clav LN along with Internal mammary and Rt Upper Mediastinal and Rt Axillary LN -----------------------------------------------------------------------------------------------------  Current treatment: Enhertu started on 08/21/2021, today is cycle 20 (every 4 weeks) Enhertu toxicities: 1,.  Severe nausea, requiring IV fluids: Better since we added Emend 2. Chemotherapy-induced anemia: Monitoring closely, hemoglobin 11.1 3.  Hospitalization 11/26/2021-11/28/2021: Dizziness and fatigue, esophageal dysmotility 4.  Hypokalemia and diarrhea: Discontinued IV fluids because it was causing her leg swelling 5.  Leukopenia: ANC 1.5: Okay to treat.  Enhertu dose reduced previously   Echocardiogram 08/21/2022: EF 60 to 65%   CT chest abdomen pelvis 06/25/2022: Stable exam.  No new or progressive findings. CT CAP 10/04/2022: Stable findings without new or progressive disease   Leg swelling: On and off

## 2022-12-07 NOTE — Progress Notes (Signed)
Patient Care Team: Dorothyann Peng, MD as PCP - General (Internal Medicine) Kathryne Hitch, MD as Consulting Physician (Orthopedic Surgery) Pershing Proud, RN as Oncology Nurse Navigator Donnelly Angelica, RN as Oncology Nurse Navigator Harriette Bouillon, MD as Consulting Physician (General Surgery) Serena Croissant, MD as Consulting Physician (Hematology and Oncology) Lonie Peak, MD as Attending Physician (Radiation Oncology)  DIAGNOSIS:  Encounter Diagnosis  Name Primary?   Malignant neoplasm of lower-outer quadrant of right breast of female, estrogen receptor positive (HCC) Yes    SUMMARY OF ONCOLOGIC HISTORY: Oncology History  Malignant neoplasm of lower-outer quadrant of right breast of female, estrogen receptor positive (HCC)  05/03/2020 Initial Diagnosis   Screening mammogram showed a 1.0cm mass at the 6 o'clock position in the right breast. Diagnostic mammogram and US showed the 1.2cm mass at the 6 o'clock position in the right breast. Biopsy showed IDC, grade 3, HER-2 positive (3+), ER 15% weak, PR-, Ki67 80%.   05/19/2020 Genetic Testing   Negative hereditary cancer genetic testing: no pathogenic variants detected in [facility] Breast STAT Panel and Common Hereditary Cancers Panel.  The report dates are May 19, 2020 (STAT) and May 23, 2020 (Common Hereditary).   The Common Hereditary Cancers Panel offered by [facility] includes sequencing and/or deletion duplication testing of the following 47 genes: APC, ATM, AXIN2, BARD1, BMPR1A, BRCA1, BRCA2, BRIP1, CDH1, CDK4, CDKN2A (p14ARF), CDKN2A (p16INK4a), CHEK2, CTNNA1, DICER1, EPCAM (Deletion/duplication testing only), GREM1 (promoter region deletion/duplication testing only), GREM1, HOXB13, KIT, MEN1, MLH1, MSH2, MSH3, MSH6, MUTYH, NBN, NF1, NHTL1, PALB2, PDGFRA, PMS2, POLD1, POLE, PTEN, RAD50, RAD51C, RAD51D, SDHA, SDHB, SDHC, SDHD, SMAD4, SMARCA4. STK11, TP53, TSC1, TSC2, and VHL.  The following genes were evaluated for sequence  changes only: SDHA and HOXB13 c.251G>A variant only.es only: SDHA and HOXB13 c.251G>A variant only.   06/02/2020 Surgery   Right lumpectomy ([facility]): invasive and in situ ductal carcinoma, 1.3cm, clear margins, 1 right axillary lymph node negative for carcinoma.   06/02/2020 Cancer Staging   Staging form: Breast, AJCC 8th Edition - Pathologic stage from 06/02/2020: Stage IA (pT1c, pN0, cM0, G3, ER+, PR-, HER2+) - Signed by Loa Socks, NP on 06/15/2020 Stage prefix: Initial diagnosis Histologic grading system: 3 grade system   07/08/2020 - 10/21/2020 Chemotherapy   Taxol Herceptin followed by Herceptin maintenance   04/14/2021 -  Anti-estrogen oral therapy   Tamoxifen 10 mg   07/13/2021 Imaging   CT angiogram: Right supraclavicular lymph node 5 cm, right internal mammary lymph nodes 1.8 cm    08/06/2021 PET scan   PET CT scan: Ext Right Supra clav LN along with Internal mammary and Rt Upper Mediastinal and Rt Axillary LN   08/2021 Initial Biopsy   Right supraclavicular lymphadenopathy: Biopsy: Metastatic poorly differentiated adenocarcinoma, ER +40%, PR negative, HER2 positive 3+    08/21/2021 -  Chemotherapy   Enhertu every 3 weeks    03/08/2022 Imaging   CT chest/abdomen/pelvis  1. No new or progressive findings in the chest, abdomen or pelvis. 2. Signs of RIGHT breast lumpectomy with areas of central fat necrosis unchanged from previous imaging. 3. Stable small lymph nodes throughout the chest, largest with area of soft tissue about surgical clips in the RIGHT axilla. 4. Stable appearance of thoracic inlet lymph nodes, not shown to be hypermetabolic on prior PET imaging. 5. IUD in-situ. 6. Aortic atherosclerosis.   08/21/2022 Imaging   Echocardiogram  -normal LVEF at 60-65%. -no left ventricular wall motion abnormalities  -normal left ventricular diastolic function  -  mild left ventricular hypertrophy.    10/05/2022 Imaging   CT Chest, Abdomen, and Pelvis with  contrast IMPRESSION: 1. Stable examination without new or progressive finding in the chest, abdomen or pelvis to suggest recurrent or metastatic disease. 2. Stable tiny bilateral thoracic inlet lymph nodes. 3.  Aortic Atherosclerosis (ICD10-I70.0).     CHIEF COMPLIANT: Enhertu cycle 22  History of Present Illness   The patient, on monthly treatments of Enhertu for metastatic breast cancer, presents with intermittent tingling in the fingers and toes. She has found relief through self-massage and rest. Previously, she experienced severe cramping and locking in the fingers, but this has not occurred recently. The patient also reports occasional nausea, but no vomiting. She manages this symptom through dietary changes, including ginger and lemon water, and has chosen not to take prescribed nausea medication due to previous issues with constipation. The patient reports no significant fatigue, possibly due to reduced work hours and the ability to work from home. 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       ALLERGIES:  is allergic to pollen extract, compazine [prochlorperazine], latex, codeine, diflucan [fluconazole], shellfish allergy, and betadine [povidone iodine].  MEDICATIONS:  Current Outpatient Medications  Medication Sig Dispense Refill   amLODipine (NORVASC) 10 MG tablet Take 1 tablet (10 mg total) by mouth daily. 90 tablet 2   aspirin EC 81 MG tablet Take 1 tablet (81 mg total) by mouth daily. Swallow whole. 30 tablet 11   Azilsartan-Chlorthalidone (EDARBYCLOR) 40-25 MG TABS Take 1 tablet by mouth daily. 30 tablet 2   cetirizine (ZYRTEC ALLERGY) 10 MG tablet Take 1 tablet (10 mg total) by mouth daily. 30 tablet 2   furosemide (LASIX) 20 MG tablet Take 1 tablet (20 mg total) by mouth as needed. TAKE 1 TABLET AS NEEDED FOR SWELLING OR SHORTNESS OF BREATH OR WEIGHT GAIN. 3 POUNDS WITHIN 24HR OR 5 POUNDS 7 DAYS  (Patient not taking: Reported on 11/14/2022) 90 tablet 3   levonorgestrel (MIRENA, 52 MG,) 20 MCG/DAY IUD 1 each by Intrauterine route once.     lidocaine-prilocaine (EMLA) cream Apply to affected area once AS DIRECTED 30 g 3   linaclotide (LINZESS) 72 MCG capsule Take 1 capsule (72 mcg total) by mouth daily before breakfast. 90 capsule 1   potassium chloride SA (KLOR-CON M) 10 MEQ tablet Take 4 tablets (40 mEq total) by mouth daily. 120 tablet 3   rosuvastatin (CRESTOR) 40 MG tablet Take 1 tablet (40 mg total) by mouth daily. 90 tablet 3   No current facility-administered medications for this visit.   Facility-Administered Medications Ordered in Other Visits  Medication Dose Route Frequency Provider Last Rate Last Admin   cyanocobalamin (VITAMIN B12) injection 1,000 mcg  1,000 mcg Intramuscular Once Serena Croissant, MD       dextrose 5 % solution   Intravenous Once Serena Croissant, MD       fam-trastuzumab deruxtecan-nxki (ENHERTU) 300 mg in dextrose 5 % 100 mL chemo infusion  300 mg Intravenous Once Serena Croissant, MD       heparin lock flush 100 unit/mL  500 Units Intracatheter Once Serena Croissant, MD       heparin lock flush 100 unit/mL  500 Units Intracatheter Once PRN Serena Croissant, MD       potassium chloride 10 mEq in 100 mL IVPB  10 mEq Intravenous Once Serena Croissant, MD 100 mL/hr at 12/07/22 1239 10 mEq at  12/07/22 1239   sodium chloride flush (NS) 0.9 % injection 10 mL  10 mL Intracatheter Once Serena Croissant, MD       sodium chloride flush (NS) 0.9 % injection 10 mL  10 mL Intracatheter PRN Serena Croissant, MD        PHYSICAL EXAMINATION: ECOG PERFORMANCE STATUS: 1 - Symptomatic but completely ambulatory  Vitals:   12/07/22 1113  BP: (!) 172/74  Pulse: 88  Resp: 18  Temp: (!) 97.2 F (36.2 C)  SpO2: 100%   Filed Weights   12/07/22 1113  Weight: (!) 305 lb 12.8 oz (138.7 kg)      LABORATORY DATA:  I have reviewed the data as listed    Latest Ref Rng & Units 12/07/2022    10:58 AM 11/09/2022   10:27 AM 10/19/2022    9:49 AM  CMP  Glucose 70 - 99 mg/dL 97  95  95   BUN 6 - 20 mg/dL 9  12  6    Creatinine 0.44 - 1.00 mg/dL 1.61  0.96  0.45   Sodium 135 - 145 mmol/L 142  142  141   Potassium 3.5 - 5.1 mmol/L 3.4  3.2  3.4   Chloride 98 - 111 mmol/L 108  109  107   CO2 22 - 32 mmol/L 29  29  30    Calcium 8.9 - 10.3 mg/dL 9.1  8.9  8.6   Total Protein 6.5 - 8.1 g/dL 6.3  6.3  6.4   Total Bilirubin 0.3 - 1.2 mg/dL 1.5  1.6  1.4   Alkaline Phos 38 - 126 U/L 106  101  108   AST 15 - 41 U/L 26  26  34   ALT 0 - 44 U/L 14  13  18      Lab Results  Component Value Date   WBC 3.0 (L) 12/07/2022   HGB 11.3 (L) 12/07/2022   HCT 34.5 (L) 12/07/2022   MCV 87.6 12/07/2022   PLT 151 12/07/2022   NEUTROABS 1.4 (L) 12/07/2022    ASSESSMENT & PLAN:  Malignant neoplasm of lower-outer quadrant of right breast of female, estrogen receptor positive (HCC) 06/02/2020:Right lumpectomy ([facility]): invasive and in situ ductal carcinoma, 1.3cm, clear margins, 1 right axillary lymph node negative for carcinoma. ER 15% weak, PR-,HER-2 positive (3+) Ki67 80%.   Treatment plan: 1.  Adjuvant chemotherapy with Taxol Herceptin followed by Herceptin maintenance.  Stopped after 11 cycles of Taxol Herceptin maintenance completed 07/07/2021  2.  Adjuvant radiation therapy 11/23/2020-12/19/2020 3.  Adjuvant antiestrogen therapy with tamoxifen started October 2022 4.  CT angiogram 07/13/2021: Right supraclavicular lymph node 5 cm, right internal mammary lymph nodes 1.8 cm 5.  Metastatic breast cancer: June 2023:Right supraclavicular lymphadenopathy: Biopsy: Metastatic poorly differentiated adenocarcinoma, ER +40%, PR negative, HER2 positive 3+ PET CT scan 08/06/21: Ext Right Supra clav LN along with Internal mammary and Rt Upper Mediastinal and Rt Axillary LN -----------------------------------------------------------------------------------------------------  Current treatment: Enhertu started on  08/21/2021, today is cycle 20 (every 4 weeks) Enhertu toxicities: 1,.  Severe nausea, requiring IV fluids: Better since we added Emend 2. Chemotherapy-induced anemia: Monitoring closely, hemoglobin 11.1 3.  Hospitalization 11/26/2021-11/28/2021: Dizziness and fatigue, esophageal dysmotility 4.  Hypokalemia and diarrhea: Discontinued IV fluids because it was causing her leg swelling 5.  Leukopenia: ANC 1.5: Okay to treat.  Enhertu dose reduced previously   Echocardiogram 08/21/2022: EF 60 to 65%   CT chest abdomen pelvis 06/25/2022: Stable exam.  No new or progressive findings. CT CAP  10/04/2022: Stable findings without new or progressive disease   Leg swelling: On and off   Chemotherapy-induced peripheral neuropathy Tingling sensation in extremities, more pronounced in feet. No severe cramping or locking of fingers recently. Managed by self-massage and heat application. -Continue current self-management strategies.  Chemotherapy-induced nausea Occasional nausea, not severe enough to induce vomiting. Patient has stopped taking prescribed anti-nausea medication due to constipation side effect. Currently managing with ginger and lemon water. -Continue current self-management strategies.  Chemotherapy-induced fatigue Patient reports minimal fatigue, possibly due to reduced work hours and ability to work from home. -Continue current lifestyle modifications.  Chemotherapy treatment Patient is on monthly treatments, currently on treatment number 22. White blood cell count fluctuates between 2-3, currently at 3.0. Neutrophil count consistently around 1.4-1.6, currently at 1.4. Hemoglobin at 11.3, an improvement from previous levels. -Continue current chemotherapy regimen. -Check labs before next treatment.  Follow-up Next appointment scheduled for November 1st with Mardella Layman. Patient prefers early morning appointments. -Schedule future appointments around 8am as per patient's preference.         No orders of the defined types were placed in this encounter.  The patient has a good understanding of the overall plan. she agrees with it. she will call with any problems that may develop before the next visit here. Total time spent: 30 mins including face to face time and time spent for planning, charting and co-ordination of care   Tamsen Meek, MD 12/07/22

## 2022-12-07 NOTE — Patient Instructions (Signed)
[place] [facility]  Discharge Instructions: Thank you for choosing [facility] to provide your oncology and hematology care.   If you have a lab appointment with the Cancer Center, please go directly to the Cancer Center and check in at the registration area.   Wear comfortable clothing and clothing appropriate for easy access to any Portacath or PICC line.   We strive to give you quality time with your provider. You may need to reschedule your appointment if you arrive late (15 or more minutes).  Arriving late affects you and other patients whose appointments are after yours.  Also, if you miss three or more appointments without notifying the office, you may be dismissed from the clinic at the provider's discretion.      For prescription refill requests, have your pharmacy contact our office and allow 72 hours for refills to be completed.    Today you received the following chemotherapy and/or immunotherapy agents Enhertu.   To help prevent nausea and vomiting after your treatment, we encourage you to take your nausea medication as directed.  BELOW ARE SYMPTOMS THAT SHOULD BE REPORTED IMMEDIATELY: *FEVER GREATER THAN 100.4 F (38 C) OR HIGHER *CHILLS OR SWEATING *NAUSEA AND VOMITING THAT IS NOT CONTROLLED WITH YOUR NAUSEA MEDICATION *UNUSUAL SHORTNESS OF BREATH *UNUSUAL BRUISING OR BLEEDING *URINARY PROBLEMS (pain or burning when urinating, or frequent urination) *BOWEL PROBLEMS (unusual diarrhea, constipation, pain near the anus) TENDERNESS IN MOUTH AND THROAT WITH OR WITHOUT PRESENCE OF ULCERS (sore throat, sores in mouth, or a toothache) UNUSUAL RASH, SWELLING OR PAIN  UNUSUAL VAGINAL DISCHARGE OR ITCHING   Items with * indicate a potential emergency and should be followed up as soon as possible or go to the Emergency Department if any problems should occur.  Please show the CHEMOTHERAPY ALERT CARD or IMMUNOTHERAPY ALERT CARD at check-in  to the Emergency Department and triage nurse.  Should you have questions after your visit or need to cancel or reschedule your appointment, please contact [facility]  Dept: [phone]  and follow the prompts.  Office hours are 8:00 a.m. to 4:30 p.m. Monday - Friday. Please note that voicemails left after 4:00 p.m. may not be returned until the following business day.  We are closed weekends and major holidays. You have access to a nurse at all times for urgent questions. Please call the main number to the clinic Dept: [phone] and follow the prompts.   For any non-urgent questions, you may also contact your provider using MyChart. We now offer e-Visits for anyone 18 and older to request care online for non-urgent symptoms. For details visit [email or web address].   Also download the MyChart app! Go to the app store, search "MyChart", open the app, select [facility], and log in with your MyChart username and password.   

## 2022-12-11 ENCOUNTER — Inpatient Hospital Stay: Payer: 59

## 2022-12-11 NOTE — Progress Notes (Signed)
[facility] CSW Progress Note  Clinical Child psychotherapist completed the Sport and exercise psychologist for [employer]. Patient was notified of submission via email.     Marguerita Merles, LCSWA Clinical Social Worker [facility]

## 2022-12-19 ENCOUNTER — Encounter: Payer: Self-pay | Admitting: Hematology and Oncology

## 2022-12-19 NOTE — Telephone Encounter (Signed)
TC

## 2022-12-21 ENCOUNTER — Other Ambulatory Visit: Payer: Self-pay | Admitting: *Deleted

## 2022-12-22 ENCOUNTER — Telehealth: Payer: Self-pay | Admitting: Adult Health

## 2022-12-27 ENCOUNTER — Inpatient Hospital Stay: Payer: 59

## 2022-12-27 NOTE — Progress Notes (Signed)
[facility] CSW Progress Note  Patient contacted CSW to discuss requested documents for the [employer]. CSW completed previous application with documents that patient submitted, agency requested patient submit an invoice / statement of car loan. CSW and patient discussed requirements, patient will contact bank. Patient informed CSW that she was approved for the [employer] grant previously applied for. Patient inquired about completing medical documentation for loan deferment. CSW informed patient the documents can be brought to next provider appointment scheduled next week, and that CSW is happy to review documents. Patient verbalized understanding. Patient will send over documents   Pamela [name], [place] Clinical Social Worker [facility]

## 2023-01-03 MED FILL — Fosaprepitant Dimeglumine For IV Infusion 150 MG (Base Eq): INTRAVENOUS | Qty: 5 | Status: AC

## 2023-01-04 ENCOUNTER — Encounter: Payer: Self-pay | Admitting: Adult Health

## 2023-01-04 ENCOUNTER — Inpatient Hospital Stay (HOSPITAL_BASED_OUTPATIENT_CLINIC_OR_DEPARTMENT_OTHER): Payer: 59 | Attending: Hematology and Oncology | Admitting: Adult Health

## 2023-01-04 ENCOUNTER — Ambulatory Visit: Payer: 59

## 2023-01-04 ENCOUNTER — Other Ambulatory Visit: Payer: 59

## 2023-01-04 ENCOUNTER — Ambulatory Visit: Payer: 59 | Admitting: Adult Health

## 2023-01-04 ENCOUNTER — Inpatient Hospital Stay: Payer: 59 | Attending: Hematology and Oncology

## 2023-01-04 ENCOUNTER — Inpatient Hospital Stay: Payer: 59

## 2023-01-04 VITALS — BP 161/75 | HR 93 | Temp 97.5°F | Resp 16 | Wt 313.4 lb

## 2023-01-04 DIAGNOSIS — C50511 Malignant neoplasm of lower-outer quadrant of right female breast: Secondary | ICD-10-CM | POA: Insufficient documentation

## 2023-01-04 DIAGNOSIS — Z9221 Personal history of antineoplastic chemotherapy: Secondary | ICD-10-CM | POA: Diagnosis not present

## 2023-01-04 DIAGNOSIS — M25471 Effusion, right ankle: Secondary | ICD-10-CM | POA: Insufficient documentation

## 2023-01-04 DIAGNOSIS — Z8349 Family history of other endocrine, nutritional and metabolic diseases: Secondary | ICD-10-CM | POA: Diagnosis not present

## 2023-01-04 DIAGNOSIS — M25472 Effusion, left ankle: Secondary | ICD-10-CM | POA: Diagnosis not present

## 2023-01-04 DIAGNOSIS — K5900 Constipation, unspecified: Secondary | ICD-10-CM | POA: Insufficient documentation

## 2023-01-04 DIAGNOSIS — C50919 Malignant neoplasm of unspecified site of unspecified female breast: Secondary | ICD-10-CM

## 2023-01-04 DIAGNOSIS — M25569 Pain in unspecified knee: Secondary | ICD-10-CM | POA: Insufficient documentation

## 2023-01-04 DIAGNOSIS — N6315 Unspecified lump in the right breast, overlapping quadrants: Secondary | ICD-10-CM | POA: Insufficient documentation

## 2023-01-04 DIAGNOSIS — Z8249 Family history of ischemic heart disease and other diseases of the circulatory system: Secondary | ICD-10-CM | POA: Diagnosis not present

## 2023-01-04 DIAGNOSIS — Z170 Estrogen receptor positive status [ER+]: Secondary | ICD-10-CM | POA: Insufficient documentation

## 2023-01-04 DIAGNOSIS — R590 Localized enlarged lymph nodes: Secondary | ICD-10-CM | POA: Insufficient documentation

## 2023-01-04 DIAGNOSIS — Z95828 Presence of other vascular implants and grafts: Secondary | ICD-10-CM

## 2023-01-04 DIAGNOSIS — R202 Paresthesia of skin: Secondary | ICD-10-CM | POA: Insufficient documentation

## 2023-01-04 DIAGNOSIS — I700 Atherosclerosis of aorta: Secondary | ICD-10-CM | POA: Diagnosis not present

## 2023-01-04 DIAGNOSIS — Z79899 Other long term (current) drug therapy: Secondary | ICD-10-CM | POA: Diagnosis not present

## 2023-01-04 DIAGNOSIS — R200 Anesthesia of skin: Secondary | ICD-10-CM | POA: Insufficient documentation

## 2023-01-04 DIAGNOSIS — G4733 Obstructive sleep apnea (adult) (pediatric): Secondary | ICD-10-CM | POA: Diagnosis not present

## 2023-01-04 DIAGNOSIS — Z808 Family history of malignant neoplasm of other organs or systems: Secondary | ICD-10-CM | POA: Insufficient documentation

## 2023-01-04 DIAGNOSIS — Z801 Family history of malignant neoplasm of trachea, bronchus and lung: Secondary | ICD-10-CM | POA: Insufficient documentation

## 2023-01-04 DIAGNOSIS — Z83438 Family history of other disorder of lipoprotein metabolism and other lipidemia: Secondary | ICD-10-CM | POA: Diagnosis not present

## 2023-01-04 DIAGNOSIS — Z1722 Progesterone receptor negative status: Secondary | ICD-10-CM | POA: Insufficient documentation

## 2023-01-04 DIAGNOSIS — Z825 Family history of asthma and other chronic lower respiratory diseases: Secondary | ICD-10-CM | POA: Insufficient documentation

## 2023-01-04 DIAGNOSIS — Z5986 Financial insecurity: Secondary | ICD-10-CM | POA: Diagnosis not present

## 2023-01-04 DIAGNOSIS — Z5112 Encounter for antineoplastic immunotherapy: Secondary | ICD-10-CM | POA: Diagnosis present

## 2023-01-04 DIAGNOSIS — R110 Nausea: Secondary | ICD-10-CM | POA: Diagnosis not present

## 2023-01-04 DIAGNOSIS — Z803 Family history of malignant neoplasm of breast: Secondary | ICD-10-CM | POA: Insufficient documentation

## 2023-01-04 DIAGNOSIS — Z833 Family history of diabetes mellitus: Secondary | ICD-10-CM | POA: Insufficient documentation

## 2023-01-04 DIAGNOSIS — I10 Essential (primary) hypertension: Secondary | ICD-10-CM | POA: Insufficient documentation

## 2023-01-04 LAB — CBC WITH DIFFERENTIAL (CANCER CENTER ONLY)
Abs Immature Granulocytes: 0.01 10*3/uL (ref 0.00–0.07)
Basophils Absolute: 0.1 10*3/uL (ref 0.0–0.1)
Basophils Relative: 1 %
Eosinophils Absolute: 0.2 10*3/uL (ref 0.0–0.5)
Eosinophils Relative: 5 %
HCT: 34.2 % — ABNORMAL LOW (ref 36.0–46.0)
Hemoglobin: 11.1 g/dL — ABNORMAL LOW (ref 12.0–15.0)
Immature Granulocytes: 0 %
Lymphocytes Relative: 27 %
Lymphs Abs: 1 10*3/uL (ref 0.7–4.0)
MCH: 28 pg (ref 26.0–34.0)
MCHC: 32.5 g/dL (ref 30.0–36.0)
MCV: 86.4 fL (ref 80.0–100.0)
Monocytes Absolute: 0.7 10*3/uL (ref 0.1–1.0)
Monocytes Relative: 18 %
Neutro Abs: 1.9 10*3/uL (ref 1.7–7.7)
Neutrophils Relative %: 49 %
Platelet Count: 160 10*3/uL (ref 150–400)
RBC: 3.96 MIL/uL (ref 3.87–5.11)
RDW: 15.7 % — ABNORMAL HIGH (ref 11.5–15.5)
WBC Count: 3.8 10*3/uL — ABNORMAL LOW (ref 4.0–10.5)
nRBC: 0 % (ref 0.0–0.2)

## 2023-01-04 LAB — CMP (CANCER CENTER ONLY)
ALT: 15 U/L (ref 0–44)
AST: 29 U/L (ref 15–41)
Albumin: 3.4 g/dL — ABNORMAL LOW (ref 3.5–5.0)
Alkaline Phosphatase: 114 U/L (ref 38–126)
Anion gap: 3 — ABNORMAL LOW (ref 5–15)
BUN: 8 mg/dL (ref 6–20)
CO2: 29 mmol/L (ref 22–32)
Calcium: 8.9 mg/dL (ref 8.9–10.3)
Chloride: 108 mmol/L (ref 98–111)
Creatinine: 0.67 mg/dL (ref 0.44–1.00)
GFR, Estimated: >60 mL/min (ref >60)
Glucose, Bld: 97 mg/dL (ref 70–99)
Potassium: 3.3 mmol/L — ABNORMAL LOW (ref 3.5–5.1)
Sodium: 140 mmol/L (ref 135–145)
Total Bilirubin: 1.4 mg/dL — ABNORMAL HIGH (ref 0.3–1.2)
Total Protein: 6.5 g/dL (ref 6.5–8.1)

## 2023-01-04 MED ORDER — DEXTROSE 5 % IV SOLN
Freq: Once | INTRAVENOUS | Status: AC
Start: 2023-01-04 — End: 2023-01-04

## 2023-01-04 MED ORDER — HEPARIN SOD (PORK) LOCK FLUSH 100 UNIT/ML IV SOLN
500.0000 [IU] | Freq: Once | INTRAVENOUS | Status: AC | PRN
  Administered 2023-01-04: 500 [IU]

## 2023-01-04 MED ORDER — POTASSIUM CHLORIDE 10 MEQ/100ML IV SOLN
10.0000 meq | Freq: Once | INTRAVENOUS | Status: AC
Start: 2023-01-04 — End: 2023-01-04
  Administered 2023-01-04: 10 meq via INTRAVENOUS
  Filled 2023-01-04: qty 100

## 2023-01-04 MED ORDER — SODIUM CHLORIDE 0.9% FLUSH
10.0000 mL | INTRAVENOUS | Status: DC | PRN
  Administered 2023-01-04: 10 mL

## 2023-01-04 MED ORDER — SODIUM CHLORIDE 0.9% FLUSH
10.0000 mL | Freq: Once | INTRAVENOUS | Status: AC
  Administered 2023-01-04: 10 mL

## 2023-01-04 MED ORDER — SODIUM CHLORIDE 0.9 % IV SOLN
150.0000 mg | Freq: Once | INTRAVENOUS | Status: AC
  Administered 2023-01-04: 150 mg via INTRAVENOUS
  Filled 2023-01-04: qty 150

## 2023-01-04 MED ORDER — PALONOSETRON HCL INJECTION 0.25 MG/5ML
0.2500 mg | Freq: Once | INTRAVENOUS | Status: AC
  Administered 2023-01-04: 0.25 mg via INTRAVENOUS
  Filled 2023-01-04: qty 5

## 2023-01-04 MED ORDER — ACETAMINOPHEN 325 MG PO TABS
650.0000 mg | ORAL_TABLET | Freq: Once | ORAL | Status: AC
  Administered 2023-01-04: 650 mg via ORAL
  Filled 2023-01-04: qty 2

## 2023-01-04 MED ORDER — SODIUM CHLORIDE 0.9 % IV SOLN: Freq: Once | INTRAVENOUS | Status: AC

## 2023-01-04 MED ORDER — DEXTROSE 5 % IV SOLN
300.0000 mg | Freq: Once | INTRAVENOUS | Status: AC
  Administered 2023-01-04: 300 mg via INTRAVENOUS
  Filled 2023-01-04: qty 15

## 2023-01-04 MED ORDER — CYANOCOBALAMIN 1000 MCG/ML IJ SOLN
1000.0000 ug | Freq: Once | INTRAMUSCULAR | Status: AC
Start: 2023-01-04 — End: 2023-01-04
  Administered 2023-01-04: 1000 ug via INTRAMUSCULAR
  Filled 2023-01-04: qty 1

## 2023-01-04 NOTE — Assessment & Plan Note (Signed)
06/02/2020:Right lumpectomy ([facility]): invasive and in situ ductal carcinoma, 1.3cm, clear margins, 1 right axillary lymph node negative for carcinoma. ER 15% weak, PR-,HER-2 positive (3+) Ki67 80%.   Treatment plan: 1.  Adjuvant chemotherapy with Taxol Herceptin followed by Herceptin maintenance.  Stopped after 11 cycles of Taxol Herceptin maintenance completed 07/07/2021  2.  Adjuvant radiation therapy 11/23/2020-12/19/2020 3.  Adjuvant antiestrogen therapy with tamoxifen started October 2022 4.  CT angiogram 07/13/2021: Right supraclavicular lymph node 5 cm, right internal mammary lymph nodes 1.8 cm 5.  Metastatic breast cancer: June 2023:Right supraclavicular lymphadenopathy: Biopsy: Metastatic poorly differentiated adenocarcinoma, ER +40%, PR negative, HER2 positive 3+ PET CT scan 08/06/21: Ext Right Supra clav LN along with Internal mammary and Rt Upper Mediastinal and Rt Axillary LN    Echocardiogram 08/21/2022: EF 60 to 65%   CT chest abdomen pelvis 06/25/2022: Stable exam.  No new or progressive findings. CT CAP 10/04/2022: Stable findings without new or progressive disease   -----------------------------------------------------------------------------------------------------  Current treatment: Enhertu started on 08/21/2021, today is cycle 20 (every 4 weeks) Enhertu toxicities: Chemotherapy Side Effects Nausea, constipation, and peripheral neuropathy reported. Nausea managed by dietary modifications. Constipation managed with over-the-counter stool softeners. Peripheral neuropathy improved, with more cramping in hands reported. -Continue current management strategies for nausea and constipation. -Continue monitoring peripheral neuropathy symptoms. -Leukopenia stable  Lower Extremity Edema Noted more when at work, possibly due to prolonged sitting. No signs of fluid retention on physical examination. -Continue current strategies to manage edema, including using a footstool and walking around  periodically. -Consider use of compression stockings.  Arthritis Pain in knees reported. Over-the-counter patches used for relief. -Continue use of over-the-counter patches for knee pain.  At risk for heart failure -Follow-up with Dr. Gala Romney.  I reached out to Julious Payer his nurse to get a repeat echocardiogram and this follow-up scheduled with Dr. Gala Romney or his team.  Follow-up and Monitoring No new symptoms reported. Scans due for monitoring response to treatment. -Schedule scans for November 18th. -Follow-up with Dr. Georgiann Mohs before November 27th treatment.

## 2023-01-04 NOTE — Patient Instructions (Signed)
[facility] [facility]  Discharge Instructions: Thank you for choosing [facility] to provide your oncology and hematology care.   If you have a lab appointment with the Cancer Center, please go directly to the Cancer Center and check in at the registration area.   Wear comfortable clothing and clothing appropriate for easy access to any Portacath or PICC line.   We strive to give you quality time with your provider. You may need to reschedule your appointment if you arrive late (15 or more minutes).  Arriving late affects you and other patients whose appointments are after yours.  Also, if you miss three or more appointments without notifying the office, you may be dismissed from the clinic at the provider's discretion.      For prescription refill requests, have your pharmacy contact our office and allow 72 hours for refills to be completed.    Today you received the following chemotherapy and/or immunotherapy agents: Enhertu      To help prevent nausea and vomiting after your treatment, we encourage you to take your nausea medication as directed.  BELOW ARE SYMPTOMS THAT SHOULD BE REPORTED IMMEDIATELY: *FEVER GREATER THAN 100.4 F (38 C) OR HIGHER *CHILLS OR SWEATING *NAUSEA AND VOMITING THAT IS NOT CONTROLLED WITH YOUR NAUSEA MEDICATION *UNUSUAL SHORTNESS OF BREATH *UNUSUAL BRUISING OR BLEEDING *URINARY PROBLEMS (pain or burning when urinating, or frequent urination) *BOWEL PROBLEMS (unusual diarrhea, constipation, pain near the anus) TENDERNESS IN MOUTH AND THROAT WITH OR WITHOUT PRESENCE OF ULCERS (sore throat, sores in mouth, or a toothache) UNUSUAL RASH, SWELLING OR PAIN  UNUSUAL VAGINAL DISCHARGE OR ITCHING   Items with * indicate a potential emergency and should be followed up as soon as possible or go to the Emergency Department if any problems should occur.  Please show the CHEMOTHERAPY ALERT CARD or IMMUNOTHERAPY ALERT CARD at  check-in to the Emergency Department and triage nurse.  Should you have questions after your visit or need to cancel or reschedule your appointment, please contact [place] [facility]  Dept: [phone]  and follow the prompts.  Office hours are 8:00 a.m. to 4:30 p.m. Monday - Friday. Please note that voicemails left after 4:00 p.m. may not be returned until the following business day.  We are closed weekends and major holidays. You have access to a nurse at all times for urgent questions. Please call the main number to the clinic Dept: [phone] and follow the prompts.   For any non-urgent questions, you may also contact your provider using MyChart. We now offer e-Visits for anyone 70 and older to request care online for non-urgent symptoms. For details visit [email or web address].   Also download the MyChart app! Go to the app store, search "MyChart", open the app, select [place], and log in with your MyChart username and password.  Hypokalemia Hypokalemia means that the amount of potassium in the blood is lower than normal. Potassium is a mineral (electrolyte) that helps regulate the amount of fluid in the body. It also stimulates muscle tightening (contraction) and helps nerves work properly. Normally, most of the body's potassium is inside cells, and only a very small amount is in the blood. Because the amount in the blood is so small, minor changes to potassium levels in the blood can be life-threatening. What are the causes? This condition may be caused by: Antibiotic medicine. Diarrhea or vomiting. Taking too much of a medicine that helps you have a  bowel movement (laxative) can cause diarrhea and lead to hypokalemia. Chronic kidney disease (CKD). Medicines that help the body get rid of excess fluid (diuretics). Eating disorders, such as anorexia or bulimia. Low magnesium levels in the body. Sweating a lot. What are the signs or symptoms? Symptoms  of this condition include: Weakness. Constipation. Fatigue. Muscle cramps. Mental confusion. Skipped heartbeats or irregular heartbeat (palpitations). Tingling or numbness. How is this diagnosed? This condition is diagnosed with a blood test. How is this treated? This condition may be treated by: Taking potassium supplements. Adjusting the medicines that you take. Eating more foods that contain a lot of potassium. If your potassium level is very low, you may need to get potassium through an IV and be monitored in the hospital. Follow these instructions at home: Eating and drinking  Eat a healthy diet. A healthy diet includes fresh fruits and vegetables, whole grains, healthy fats, and lean proteins. If told, eat more foods that contain a lot of potassium. These include: Nuts, such as peanuts and pistachios. Seeds, such as sunflower seeds and pumpkin seeds. Peas, lentils, and lima beans. Whole grain and bran cereals and breads. Fresh fruits and vegetables, such as apricots, avocado, bananas, cantaloupe, kiwi, oranges, tomatoes, asparagus, and potatoes. Juices, such as orange, tomato, and prune. Lean meats, including fish. Milk and milk products, such as yogurt. General instructions Take over-the-counter and prescription medicines only as told by your health care provider. This includes vitamins, natural food products, and supplements. Keep all follow-up visits. This is important. Contact a health care provider if: You have weakness that gets worse. You feel your heart pounding or racing. You vomit. You have diarrhea. You have diabetes and you have trouble keeping your blood sugar in your target range. Get help right away if: You have chest pain. You have shortness of breath. You have vomiting or diarrhea that lasts for more than 2 days. You faint. These symptoms may be an emergency. Get help right away. Call 911. Do not wait to see if the symptoms will go away. Do not  drive yourself to the hospital. Summary Hypokalemia means that the amount of potassium in the blood is lower than normal. This condition is diagnosed with a blood test. Hypokalemia may be treated by taking potassium supplements, adjusting the medicines that you take, or eating more foods that are high in potassium. If your potassium level is very low, you may need to get potassium through an IV and be monitored in the hospital. This information is not intended to replace advice given to you by your health care provider. Make sure you discuss any questions you have with your health care provider. Document Revised: 11/03/2020 Document Reviewed: 11/03/2020 Elsevier Patient Education  2024 [employer].

## 2023-01-04 NOTE — Progress Notes (Signed)
OK to treat today with echocardiogram from 6/18.

## 2023-01-04 NOTE — Progress Notes (Signed)
[facility] Cancer Follow up:    Pamela Peng, MD [address] [place] [address] [place] [address]   DIAGNOSIS:  Cancer Staging  Malignant neoplasm of lower-outer quadrant of right breast of female, estrogen receptor positive (HCC) Staging form: Breast, AJCC 8th Edition - Clinical stage from 05/11/2020: Stage IA (cT1b, cN0, cM0, G3, ER+, PR-, HER2+) - Signed by Serena Croissant, MD on 05/11/2020 Stage prefix: Initial diagnosis - Pathologic stage from 06/02/2020: Stage IA (pT1c, pN0, cM0, G3, ER+, PR-, HER2+) - Signed by Loa Socks, NP on 06/15/2020 Stage prefix: Initial diagnosis Histologic grading system: 3 grade system   SUMMARY OF ONCOLOGIC HISTORY: Oncology History  Malignant neoplasm of lower-outer quadrant of right breast of female, estrogen receptor positive (HCC)  05/03/2020 Initial Diagnosis   Screening mammogram showed a 1.0cm mass at the 6 o'clock position in the right breast. Diagnostic mammogram and US showed the 1.2cm mass at the 6 o'clock position in the right breast. Biopsy showed IDC, grade 3, HER-2 positive (3+), ER 15% weak, PR-, Ki67 80%.   05/19/2020 Genetic Testing   Negative hereditary cancer genetic testing: no pathogenic variants detected in [facility] Breast STAT Panel and Common Hereditary Cancers Panel.  The report dates are May 19, 2020 (STAT) and May 23, 2020 (Common Hereditary).   The Common Hereditary Cancers Panel offered by [facility] includes sequencing and/or deletion duplication testing of the following 47 genes: APC, ATM, AXIN2, BARD1, BMPR1A, BRCA1, BRCA2, BRIP1, CDH1, CDK4, CDKN2A (p14ARF), CDKN2A (p16INK4a), CHEK2, CTNNA1, DICER1, EPCAM (Deletion/duplication testing only), GREM1 (promoter region deletion/duplication testing only), GREM1, HOXB13, KIT, MEN1, MLH1, MSH2, MSH3, MSH6, MUTYH, NBN, NF1, NHTL1, PALB2, PDGFRA, PMS2, POLD1, POLE, PTEN, RAD50, RAD51C, RAD51D, SDHA, SDHB, SDHC, SDHD, SMAD4, SMARCA4. STK11, TP53, TSC1, TSC2, and  VHL.  The following genes were evaluated for sequence changes only: SDHA and HOXB13 c.251G>A variant only.es only: SDHA and HOXB13 c.251G>A variant only.   06/02/2020 Surgery   Right lumpectomy ([facility]): invasive and in situ ductal carcinoma, 1.3cm, clear margins, 1 right axillary lymph node negative for carcinoma.   06/02/2020 Cancer Staging   Staging form: Breast, AJCC 8th Edition - Pathologic stage from 06/02/2020: Stage IA (pT1c, pN0, cM0, G3, ER+, PR-, HER2+) - Signed by Loa Socks, NP on 06/15/2020 Stage prefix: Initial diagnosis Histologic grading system: 3 grade system   07/08/2020 - 10/21/2020 Chemotherapy   Taxol Herceptin followed by Herceptin maintenance   04/14/2021 -  Anti-estrogen oral therapy   Tamoxifen 10 mg   07/13/2021 Imaging   CT angiogram: Right supraclavicular lymph node 5 cm, right internal mammary lymph nodes 1.8 cm    08/06/2021 PET scan   PET CT scan: Ext Right Supra clav LN along with Internal mammary and Rt Upper Mediastinal and Rt Axillary LN   08/2021 Initial Biopsy   Right supraclavicular lymphadenopathy: Biopsy: Metastatic poorly differentiated adenocarcinoma, ER +40%, PR negative, HER2 positive 3+    08/21/2021 -  Chemotherapy   Enhertu every 3 weeks    03/08/2022 Imaging   CT chest/abdomen/pelvis  1. No new or progressive findings in the chest, abdomen or pelvis. 2. Signs of RIGHT breast lumpectomy with areas of central fat necrosis unchanged from previous imaging. 3. Stable small lymph nodes throughout the chest, largest with area of soft tissue about surgical clips in the RIGHT axilla. 4. Stable appearance of thoracic inlet lymph nodes, not shown to be hypermetabolic on prior PET imaging. 5. IUD in-situ. 6. Aortic atherosclerosis.   08/21/2022 Imaging   Echocardiogram  -  normal LVEF at 60-65%. -no left ventricular wall motion abnormalities  -normal left ventricular diastolic function  -mild left ventricular hypertrophy.    10/05/2022  Imaging   CT Chest, Abdomen, and Pelvis with contrast IMPRESSION: 1. Stable examination without new or progressive finding in the chest, abdomen or pelvis to suggest recurrent or metastatic disease. 2. Stable tiny bilateral thoracic inlet lymph nodes. 3.  Aortic Atherosclerosis (ICD10-I70.0).     CURRENT THERAPY: Enhertu  INTERVAL HISTORY: Pamela [name] 57 y.o. female returns for follow-up of her metastatic breast cancer currently on treatment with Enhertu.  Her most recent echocardiogram occurred on August 21, 2022 and demonstrated a left ventricular ejection fraction of 60 to 65%.   She experiences occasional nausea, but no vomiting, and has identified certain foods, particularly acidic ones, as triggers. The nausea is managed by dietary modifications and does not require medication. She also reports constipation lasting one to two weeks at a time, which is managed with over-the-counter stool softeners.  The patient has noticed some numbness and tingling, but these symptoms have improved. She experiences more cramping and hand locking, a symptom she had prior to treatment. She also reports swelling in her ankles, particularly when sitting for extended periods at work. She has attempted to mitigate this with a footstool and an under-desk elliptical pedal device, but notes that the swelling is more pronounced when in the office.  The patient has been experiencing headaches, but these are infrequent and mild, requiring Tylenol only twice in the past three months. She also reports knee pain, which she attributes to arthritis, and has considered using compression stockings to alleviate this. She has found relief from knee pain using over-the-counter muscle joint patches.   Patient Active Problem List   Diagnosis Date Noted   Drug-induced constipation 11/24/2022   Chemotherapy induced cardiomyopathy (HCC) 05/08/2022   Aortic atherosclerosis (HCC) 04/26/2022   Acute cough 01/15/2022    Hypomagnesemia 11/26/2021   Hypokalemia 11/26/2021   Dizziness, fatigue, globus sensation 11/26/2021   Breast cancer (HCC) 08/25/2021   Nausea without vomiting 08/25/2021   Prediabetes 03/13/2021   Non-intractable vomiting 07/13/2020   Port-A-Cath in place 07/08/2020   Genetic testing 05/20/2020   Family history of breast cancer 05/12/2020   Family history of lung cancer 05/12/2020   Malignant neoplasm of lower-outer quadrant of right breast of female, estrogen receptor positive (HCC) 05/03/2020   Elevated troponin level not due myocardial infarction 09/18/2019   Mixed hyperlipidemia 09/18/2019   Hypertensive emergency 09/17/2019   Primary osteoarthritis of right hip 08/06/2018   Seasonal allergies 06/12/2018   Fibrocystic disease of breast 05/27/2018   Obstructive sleep apnea syndrome, moderate 10/27/2013   Shift work sleep disorder 10/27/2013   Psychic factors associated with diseases classified elsewhere 08/07/2013   Pure hypercholesterolemia 07/21/2013   Essential hypertension 07/21/2013   Snoring 07/21/2013   IUD contraception-inserted 10/01/11 10/01/2011   Class 3 severe obesity due to excess calories with serious comorbidity and body mass index (BMI) of 40.0 to 44.9 in adult (HCC) 09/03/2011    is allergic to pollen extract, compazine [prochlorperazine], latex, codeine, diflucan [fluconazole], shellfish allergy, and betadine [povidone iodine].  MEDICAL HISTORY: Past Medical History:  Diagnosis Date   Anxiety    Asthma    Back pain    Breast cancer (HCC)    Constipation    DUB (dysfunctional uterine bleeding) 2007   Edema of both lower extremities    Elevated cholesterol    Family history of breast cancer 05/12/2020  Family history of lung cancer 05/12/2020   Food allergy    H/O menorrhagia 05/01/2006   High cholesterol    History of ovarian cyst 2007   Hypertension 03/31/2005   Increased BMI 07/11/2006   Joint pain    Migraine    N&V (nausea and vomiting)     Obesity 2007   Obstructive sleep apnea syndrome, moderate 10/27/2013   no cpap   Personal history of chemotherapy    Personal history of radiation therapy    Sleep apnea    SOB (shortness of breath)    Vitamin D deficiency    Vitamin D deficiency    Weight loss 07/11/2006    SURGICAL HISTORY: Past Surgical History:  Procedure Laterality Date   BREAST LUMPECTOMY     BREAST LUMPECTOMY WITH RADIOACTIVE SEED AND SENTINEL LYMPH NODE BIOPSY Right 06/02/2020   Procedure: RIGHT BREAST LUMPECTOMY WITH RADIOACTIVE SEED AND RIGHT SENTINEL LYMPH NODE BIOPSY;  Surgeon: Harriette Bouillon, MD;  Location: [facility];  Service: General;  Laterality: Right;   BREAST REDUCTION SURGERY  05/04/1991   CESAREAN SECTION     HYSTEROSCOPY  05/01/2006   PORTACATH PLACEMENT Right 06/02/2020   Procedure: INSERTION PORT-A-CATH;  Surgeon: Harriette Bouillon, MD;  Location:  SURGERY CENTER;  Service: General;  Laterality: Right;   REDUCTION MAMMAPLASTY      SOCIAL HISTORY: Social History   Socioeconomic History   Marital status: Single    Spouse name: Not on file   Number of children: Not on file   Years of education: Not on file   Highest education level: Not on file  Occupational History   Occupation: Child psychotherapist  Tobacco Use   Smoking status: Never   Smokeless tobacco: Never  Vaping Use   Vaping status: Never Used  Substance and Sexual Activity   Alcohol use: No   Drug use: Not Currently    Frequency: 2.0 times per week   Sexual activity: Not Currently    Birth control/protection: I.U.D.    Comment: Mirena  Other Topics Concern   Not on file  Social History Narrative   Not on file   Social Determinants of Health   Financial Resource Strain: High Risk (03/14/2022)   Overall Financial Resource Strain (CARDIA)    Difficulty of Paying Living Expenses: Hard  Food Insecurity: Not on file  Transportation Needs: Not on file  Physical Activity: Not on file  Stress: Not on  file  Social Connections: Not on file  Intimate Partner Violence: Not on file    FAMILY HISTORY: Family History  Problem Relation Age of Onset   Hypertension Mother    High Cholesterol Mother    Thyroid disease Mother    Cancer Mother    Sleep apnea Mother    Hypertension Father    Heart attack Father    Sudden death Father    Diabetes Maternal Grandmother    Bone cancer Maternal Grandfather        dx after 72   Breast cancer Maternal Aunt        dx 30s   Lung cancer Maternal Aunt        dx after 50    Review of Systems  Constitutional:  Negative for appetite change, chills, fatigue, fever and unexpected weight change.  HENT:   Negative for hearing loss, lump/mass and trouble swallowing.   Eyes:  Negative for eye problems and icterus.  Respiratory:  Negative for chest tightness, cough and shortness of breath.  Cardiovascular:  Negative for chest pain, leg swelling and palpitations.  Gastrointestinal:  Negative for abdominal distention, abdominal pain, constipation, diarrhea, nausea and vomiting.  Endocrine: Negative for hot flashes.  Genitourinary:  Negative for difficulty urinating.   Musculoskeletal:  Negative for arthralgias.  Skin:  Negative for itching and rash.  Neurological:  Negative for dizziness, extremity weakness, headaches and numbness.  Hematological:  Negative for adenopathy. Does not bruise/bleed easily.  Psychiatric/Behavioral:  Negative for depression. The patient is not nervous/anxious.       PHYSICAL EXAMINATION    Vitals:   01/04/23 0903  BP: (!) 161/75  Pulse: 93  Resp: 16  Temp: (!) 97.5 F (36.4 C)  SpO2: 100%    Physical Exam Constitutional:      General: She is not in acute distress.    Appearance: Normal appearance. She is not toxic-appearing.  HENT:     Head: Normocephalic and atraumatic.     Mouth/Throat:     Mouth: Mucous membranes are moist.     Pharynx: Oropharynx is clear. No oropharyngeal exudate or posterior  oropharyngeal erythema.  Eyes:     General: No scleral icterus. Cardiovascular:     Rate and Rhythm: Normal rate and regular rhythm.     Pulses: Normal pulses.     Heart sounds: Normal heart sounds.  Pulmonary:     Effort: Pulmonary effort is normal.     Breath sounds: Normal breath sounds.  Abdominal:     General: Abdomen is flat. Bowel sounds are normal. There is no distension.     Palpations: Abdomen is soft.     Tenderness: There is no abdominal tenderness.  Musculoskeletal:        General: No swelling.     Cervical back: Neck supple.  Lymphadenopathy:     Cervical: No cervical adenopathy.  Skin:    General: Skin is warm and dry.     Findings: No rash.  Neurological:     General: No focal deficit present.     Mental Status: She is alert.  Psychiatric:        Mood and Affect: Mood normal.        Behavior: Behavior normal.     LABORATORY DATA:  CBC    Component Value Date/Time   WBC 3.8 (L) 01/04/2023 0846   WBC 5.3 11/28/2021 0901   RBC 3.96 01/04/2023 0846   HGB 11.1 (L) 01/04/2023 0846   HGB 13.2 09/22/2019 1040   HCT 34.2 (L) 01/04/2023 0846   HCT 41.3 09/22/2019 1040   PLT 160 01/04/2023 0846   PLT 247 09/22/2019 1040   MCV 86.4 01/04/2023 0846   MCV 83 09/22/2019 1040   MCH 28.0 01/04/2023 0846   MCHC 32.5 01/04/2023 0846   RDW 15.7 (H) 01/04/2023 0846   RDW 14.7 09/22/2019 1040   LYMPHSABS 1.0 01/04/2023 0846   MONOABS 0.7 01/04/2023 0846   EOSABS 0.2 01/04/2023 0846   BASOSABS 0.1 01/04/2023 0846    CMP     Component Value Date/Time   NA 140 01/04/2023 0846   NA 142 09/22/2019 1040   K 3.3 (L) 01/04/2023 0846   CL 108 01/04/2023 0846   CO2 29 01/04/2023 0846   GLUCOSE 97 01/04/2023 0846   BUN 8 01/04/2023 0846   BUN 11 09/22/2019 1040   CREATININE 0.67 01/04/2023 0846   CALCIUM 8.9 01/04/2023 0846   PROT 6.5 01/04/2023 0846   PROT 6.7 06/17/2019 1054   ALBUMIN 3.4 (L) 01/04/2023 1610  ALBUMIN 4.5 06/17/2019 1054   AST 29 01/04/2023  0846   ALT 15 01/04/2023 0846   ALKPHOS 114 01/04/2023 0846   BILITOT 1.4 (H) 01/04/2023 0846   GFRNONAA >60 01/04/2023 0846   GFRAA 77 09/22/2019 1040       ASSESSMENT and THERAPY PLAN:   Malignant neoplasm of lower-outer quadrant of right breast of female, estrogen receptor positive (HCC) 06/02/2020:Right lumpectomy ([facility]): invasive and in situ ductal carcinoma, 1.3cm, clear margins, 1 right axillary lymph node negative for carcinoma. ER 15% weak, PR-,HER-2 positive (3+) Ki67 80%.   Treatment plan: 1.  Adjuvant chemotherapy with Taxol Herceptin followed by Herceptin maintenance.  Stopped after 11 cycles of Taxol Herceptin maintenance completed 07/07/2021  2.  Adjuvant radiation therapy 11/23/2020-12/19/2020 3.  Adjuvant antiestrogen therapy with tamoxifen started October 2022 4.  CT angiogram 07/13/2021: Right supraclavicular lymph node 5 cm, right internal mammary lymph nodes 1.8 cm 5.  Metastatic breast cancer: June 2023:Right supraclavicular lymphadenopathy: Biopsy: Metastatic poorly differentiated adenocarcinoma, ER +40%, PR negative, HER2 positive 3+ PET CT scan 08/06/21: Ext Right Supra clav LN along with Internal mammary and Rt Upper Mediastinal and Rt Axillary LN    Echocardiogram 08/21/2022: EF 60 to 65%   CT chest abdomen pelvis 06/25/2022: Stable exam.  No new or progressive findings. CT CAP 10/04/2022: Stable findings without new or progressive disease   -----------------------------------------------------------------------------------------------------  Current treatment: Enhertu started on 08/21/2021, today is cycle 20 (every 4 weeks) Enhertu toxicities: Chemotherapy Side Effects Nausea, constipation, and peripheral neuropathy reported. Nausea managed by dietary modifications. Constipation managed with over-the-counter stool softeners. Peripheral neuropathy improved, with more cramping in hands reported. -Continue current management strategies for nausea and  constipation. -Continue monitoring peripheral neuropathy symptoms. -Leukopenia stable  Lower Extremity Edema Noted more when at work, possibly due to prolonged sitting. No signs of fluid retention on physical examination. -Continue current strategies to manage edema, including using a footstool and walking around periodically. -Consider use of compression stockings.  Arthritis Pain in knees reported. Over-the-counter patches used for relief. -Continue use of over-the-counter patches for knee pain.  At risk for heart failure -Follow-up with Dr. Gala Romney.  I reached out to Julious Payer his nurse to get a repeat echocardiogram and this follow-up scheduled with Dr. Gala Romney or his team.  Follow-up and Monitoring No new symptoms reported. Scans due for monitoring response to treatment. -Schedule scans for November 18th. -Follow-up with Dr. Georgiann Mohs before November 27th treatment.  All questions were answered. The patient knows to call the clinic with any problems, questions or concerns. We can certainly see the patient much sooner if necessary.  Total encounter time:30 minutes*in face-to-face visit time, chart review, lab review, care coordination, order entry, and documentation of the encounter time.    Lillard Anes, NP 01/04/23 9:42 AM Medical Oncology and Hematology [facility] [address] [place], [place] [address] Tel. [phone]    Fax. [phone]  *Total Encounter Time as defined by the Centers for Medicare and Medicaid Services includes, in addition to the face-to-face time of a patient visit (documented in the note above) non-face-to-face time: obtaining and reviewing outside history, ordering and reviewing medications, tests or procedures, care coordination (communications with other health care professionals or caregivers) and documentation in the medical record.

## 2023-01-08 ENCOUNTER — Telehealth: Payer: Self-pay

## 2023-01-08 ENCOUNTER — Encounter: Payer: Self-pay | Admitting: *Deleted

## 2023-01-08 ENCOUNTER — Other Ambulatory Visit: Payer: Self-pay | Admitting: Internal Medicine

## 2023-01-08 MED ORDER — VALSARTAN 160 MG PO TABS: 160.0000 mg | ORAL_TABLET | Freq: Every day | ORAL | 11 refills | Status: DC

## 2023-01-08 MED ORDER — CHLORTHALIDONE 25 MG PO TABS: 25.0000 mg | ORAL_TABLET | Freq: Every day | ORAL | 1 refills | Status: DC

## 2023-01-08 NOTE — Telephone Encounter (Signed)
Patient called reporting her insurance no longer cover Edarbyclor. Provider aware. Per provider patient will switch to Valsartan & Chlorthalidone. Nurse visit scheduled for 1 week from start date.

## 2023-01-10 ENCOUNTER — Telehealth: Payer: Self-pay

## 2023-01-10 NOTE — Telephone Encounter (Signed)
Attempted to call pt to discuss need for changes to accommodation letter. Not able to leave voicemail.

## 2023-01-16 ENCOUNTER — Telehealth: Payer: Self-pay

## 2023-01-16 NOTE — Telephone Encounter (Signed)
Attempted to notify patient that fmla documents have been completed and faxed to the company. Fax confirmation received. Copy of documents placed up front for pick up at next visit.

## 2023-01-28 ENCOUNTER — Inpatient Hospital Stay: Payer: 59 | Admitting: Licensed Clinical Social Worker

## 2023-01-28 NOTE — Progress Notes (Signed)
[facility] CSW Progress Note  Clinical Child psychotherapist communicated with pt for updated bill to submit with [employer] application.  Application for assistance submitted today. [employer] will notify of approval/denial.    Kortnie Stovall E Erynn Vaca, LCSW Clinical Social Worker [facility]

## 2023-01-29 MED FILL — Fosaprepitant Dimeglumine For IV Infusion 150 MG (Base Eq): INTRAVENOUS | Qty: 5 | Status: AC

## 2023-01-30 ENCOUNTER — Inpatient Hospital Stay (HOSPITAL_BASED_OUTPATIENT_CLINIC_OR_DEPARTMENT_OTHER): Payer: 59 | Admitting: Hematology and Oncology

## 2023-01-30 ENCOUNTER — Inpatient Hospital Stay: Payer: 59

## 2023-01-30 VITALS — BP 177/74 | HR 89 | Temp 97.7°F | Resp 18 | Ht 69.0 in | Wt 317.6 lb

## 2023-01-30 DIAGNOSIS — Z170 Estrogen receptor positive status [ER+]: Secondary | ICD-10-CM

## 2023-01-30 DIAGNOSIS — R110 Nausea: Secondary | ICD-10-CM

## 2023-01-30 DIAGNOSIS — C50919 Malignant neoplasm of unspecified site of unspecified female breast: Secondary | ICD-10-CM

## 2023-01-30 DIAGNOSIS — C50511 Malignant neoplasm of lower-outer quadrant of right female breast: Secondary | ICD-10-CM

## 2023-01-30 DIAGNOSIS — Z95828 Presence of other vascular implants and grafts: Secondary | ICD-10-CM

## 2023-01-30 LAB — CMP (CANCER CENTER ONLY)
ALT: 14 U/L (ref 0–44)
AST: 27 U/L (ref 15–41)
Albumin: 3.3 g/dL — ABNORMAL LOW (ref 3.5–5.0)
Alkaline Phosphatase: 121 U/L (ref 38–126)
Anion gap: 5 (ref 5–15)
BUN: 8 mg/dL (ref 6–20)
CO2: 29 mmol/L (ref 22–32)
Calcium: 8.8 mg/dL — ABNORMAL LOW (ref 8.9–10.3)
Chloride: 107 mmol/L (ref 98–111)
Creatinine: 0.67 mg/dL (ref 0.44–1.00)
GFR, Estimated: >60 mL/min (ref >60)
Glucose, Bld: 110 mg/dL — ABNORMAL HIGH (ref 70–99)
Potassium: 3.1 mmol/L — ABNORMAL LOW (ref 3.5–5.1)
Sodium: 141 mmol/L (ref 135–145)
Total Bilirubin: 1.5 mg/dL — ABNORMAL HIGH (ref <1.2)
Total Protein: 6.4 g/dL — ABNORMAL LOW (ref 6.5–8.1)

## 2023-01-30 LAB — CBC WITH DIFFERENTIAL (CANCER CENTER ONLY)
Abs Immature Granulocytes: 0.01 10*3/uL (ref 0.00–0.07)
Basophils Absolute: 0.1 10*3/uL (ref 0.0–0.1)
Basophils Relative: 1 %
Eosinophils Absolute: 0.2 10*3/uL (ref 0.0–0.5)
Eosinophils Relative: 5 %
HCT: 34.8 % — ABNORMAL LOW (ref 36.0–46.0)
Hemoglobin: 11.3 g/dL — ABNORMAL LOW (ref 12.0–15.0)
Immature Granulocytes: 0 %
Lymphocytes Relative: 26 %
Lymphs Abs: 1 10*3/uL (ref 0.7–4.0)
MCH: 27.9 pg (ref 26.0–34.0)
MCHC: 32.5 g/dL (ref 30.0–36.0)
MCV: 85.9 fL (ref 80.0–100.0)
Monocytes Absolute: 0.7 10*3/uL (ref 0.1–1.0)
Monocytes Relative: 19 %
Neutro Abs: 1.9 10*3/uL (ref 1.7–7.7)
Neutrophils Relative %: 49 %
Platelet Count: 192 10*3/uL (ref 150–400)
RBC: 4.05 MIL/uL (ref 3.87–5.11)
RDW: 16 % — ABNORMAL HIGH (ref 11.5–15.5)
WBC Count: 3.8 10*3/uL — ABNORMAL LOW (ref 4.0–10.5)
nRBC: 0 % (ref 0.0–0.2)

## 2023-01-30 MED ORDER — FAM-TRASTUZUMAB DERUXTECAN-NXKI CHEMO 100 MG IV SOLR
300.0000 mg | Freq: Once | INTRAVENOUS | Status: AC
  Administered 2023-01-30: 300 mg via INTRAVENOUS
  Filled 2023-01-30: qty 15

## 2023-01-30 MED ORDER — SODIUM CHLORIDE 0.9% FLUSH
10.0000 mL | Freq: Once | INTRAVENOUS | Status: AC
  Administered 2023-01-30: 10 mL via INTRAVENOUS

## 2023-01-30 MED ORDER — POTASSIUM CHLORIDE 10 MEQ/100ML IV SOLN
10.0000 meq | Freq: Once | INTRAVENOUS | Status: AC
  Administered 2023-01-30: 10 meq via INTRAVENOUS
  Filled 2023-01-30: qty 100

## 2023-01-30 MED ORDER — SODIUM CHLORIDE 0.9 % IV SOLN
Freq: Once | INTRAVENOUS | Status: AC
Start: 2023-01-30 — End: 2023-01-30

## 2023-01-30 MED ORDER — PALONOSETRON HCL INJECTION 0.25 MG/5ML
0.2500 mg | Freq: Once | INTRAVENOUS | Status: AC
  Administered 2023-01-30: 0.25 mg via INTRAVENOUS
  Filled 2023-01-30: qty 5

## 2023-01-30 MED ORDER — CYANOCOBALAMIN 1000 MCG/ML IJ SOLN
1000.0000 ug | Freq: Once | INTRAMUSCULAR | Status: AC
Start: 2023-01-30 — End: 2023-01-30
  Administered 2023-01-30: 1000 ug via INTRAMUSCULAR
  Filled 2023-01-30: qty 1

## 2023-01-30 MED ORDER — SODIUM CHLORIDE 0.9 % IV SOLN
150.0000 mg | Freq: Once | INTRAVENOUS | Status: AC
  Administered 2023-01-30: 150 mg via INTRAVENOUS
  Filled 2023-01-30: qty 150

## 2023-01-30 MED ORDER — ACETAMINOPHEN 325 MG PO TABS
650.0000 mg | ORAL_TABLET | Freq: Once | ORAL | Status: AC
Start: 2023-01-30 — End: 2023-01-30
  Administered 2023-01-30: 650 mg via ORAL
  Filled 2023-01-30: qty 2

## 2023-01-30 MED ORDER — DEXTROSE 5 % IV SOLN
Freq: Once | INTRAVENOUS | Status: AC
Start: 2023-01-30 — End: 2023-01-30

## 2023-01-30 NOTE — Assessment & Plan Note (Addendum)
06/02/2020:Right lumpectomy ([facility]): invasive and in situ ductal carcinoma, 1.3cm, clear margins, 1 right axillary lymph node negative for carcinoma. ER 15% weak, PR-,HER-2 positive (3+) Ki67 80%.   Treatment plan: 1.  Adjuvant chemotherapy with Taxol Herceptin followed by Herceptin maintenance.  Stopped after 11 cycles of Taxol Herceptin maintenance completed 07/07/2021  2.  Adjuvant radiation therapy 11/23/2020-12/19/2020 3.  Adjuvant antiestrogen therapy with tamoxifen started October 2022 4.  CT angiogram 07/13/2021: Right supraclavicular lymph node 5 cm, right internal mammary lymph nodes 1.8 cm 5.  Metastatic breast cancer: June 2023:Right supraclavicular lymphadenopathy: Biopsy: Metastatic poorly differentiated adenocarcinoma, ER +40%, PR negative, HER2 positive 3+ PET CT scan 08/06/21: Ext Right Supra clav LN along with Internal mammary and Rt Upper Mediastinal and Rt Axillary LN -----------------------------------------------------------------------------------------------------  Current treatment: Enhertu started on 08/21/2021, today is cycle 24 (every 4 weeks) Enhertu toxicities: 1,.  Nausea: Mild 2. Chemotherapy-induced anemia: Monitoring closely, hemoglobin 11.3 3.  Hospitalization 11/26/2021-11/28/2021: Dizziness and fatigue, esophageal dysmotility 4.   Chemo-induced peripheral neuropathy 5.  Fatigue   Echocardiogram 08/21/2022: EF 60 to 65%   CT chest abdomen pelvis 06/25/2022: Stable exam.  No new or progressive findings. CT CAP 10/04/2022: Stable findings without new or progressive disease   Leg swelling: On and off CT scan scheduled for 02/22/2023

## 2023-01-30 NOTE — Progress Notes (Signed)
Ok to proceed without CMP results Per Dr. Pamelia Hoit

## 2023-01-30 NOTE — Progress Notes (Signed)
Patient Care Team: Dorothyann Peng, MD as PCP - General (Internal Medicine) Kathryne Hitch, MD as Consulting Physician (Orthopedic Surgery) Pershing Proud, RN as Oncology Nurse Navigator Donnelly Angelica, RN as Oncology Nurse Navigator Harriette Bouillon, MD as Consulting Physician (General Surgery) Serena Croissant, MD as Consulting Physician (Hematology and Oncology) Lonie Peak, MD as Attending Physician (Radiation Oncology)  DIAGNOSIS:  Encounter Diagnosis  Name Primary?   Malignant neoplasm of lower-outer quadrant of right breast of female, estrogen receptor positive (HCC) Yes    SUMMARY OF ONCOLOGIC HISTORY: Oncology History  Malignant neoplasm of lower-outer quadrant of right breast of female, estrogen receptor positive (HCC)  05/03/2020 Initial Diagnosis   Screening mammogram showed a 1.0cm mass at the 6 o'clock position in the right breast. Diagnostic mammogram and US showed the 1.2cm mass at the 6 o'clock position in the right breast. Biopsy showed IDC, grade 3, HER-2 positive (3+), ER 15% weak, PR-, Ki67 80%.   05/19/2020 Genetic Testing   Negative hereditary cancer genetic testing: no pathogenic variants detected in [facility] Breast STAT Panel and Common Hereditary Cancers Panel.  The report dates are May 19, 2020 (STAT) and May 23, 2020 (Common Hereditary).   The Common Hereditary Cancers Panel offered by [facility] includes sequencing and/or deletion duplication testing of the following 47 genes: APC, ATM, AXIN2, BARD1, BMPR1A, BRCA1, BRCA2, BRIP1, CDH1, CDK4, CDKN2A (p14ARF), CDKN2A (p16INK4a), CHEK2, CTNNA1, DICER1, EPCAM (Deletion/duplication testing only), GREM1 (promoter region deletion/duplication testing only), GREM1, HOXB13, KIT, MEN1, MLH1, MSH2, MSH3, MSH6, MUTYH, NBN, NF1, NHTL1, PALB2, PDGFRA, PMS2, POLD1, POLE, PTEN, RAD50, RAD51C, RAD51D, SDHA, SDHB, SDHC, SDHD, SMAD4, SMARCA4. STK11, TP53, TSC1, TSC2, and VHL.  The following genes were evaluated for sequence  changes only: SDHA and HOXB13 c.251G>A variant only.es only: SDHA and HOXB13 c.251G>A variant only.   06/02/2020 Surgery   Right lumpectomy ([facility]): invasive and in situ ductal carcinoma, 1.3cm, clear margins, 1 right axillary lymph node negative for carcinoma.   06/02/2020 Cancer Staging   Staging form: Breast, AJCC 8th Edition - Pathologic stage from 06/02/2020: Stage IA (pT1c, pN0, cM0, G3, ER+, PR-, HER2+) - Signed by Loa Socks, NP on 06/15/2020 Stage prefix: Initial diagnosis Histologic grading system: 3 grade system   07/08/2020 - 10/21/2020 Chemotherapy   Taxol Herceptin followed by Herceptin maintenance   04/14/2021 -  Anti-estrogen oral therapy   Tamoxifen 10 mg   07/13/2021 Imaging   CT angiogram: Right supraclavicular lymph node 5 cm, right internal mammary lymph nodes 1.8 cm    08/06/2021 PET scan   PET CT scan: Ext Right Supra clav LN along with Internal mammary and Rt Upper Mediastinal and Rt Axillary LN   08/2021 Initial Biopsy   Right supraclavicular lymphadenopathy: Biopsy: Metastatic poorly differentiated adenocarcinoma, ER +40%, PR negative, HER2 positive 3+    08/21/2021 -  Chemotherapy   Enhertu every 3 weeks    03/08/2022 Imaging   CT chest/abdomen/pelvis  1. No new or progressive findings in the chest, abdomen or pelvis. 2. Signs of RIGHT breast lumpectomy with areas of central fat necrosis unchanged from previous imaging. 3. Stable small lymph nodes throughout the chest, largest with area of soft tissue about surgical clips in the RIGHT axilla. 4. Stable appearance of thoracic inlet lymph nodes, not shown to be hypermetabolic on prior PET imaging. 5. IUD in-situ. 6. Aortic atherosclerosis.   08/21/2022 Imaging   Echocardiogram  -normal LVEF at 60-65%. -no left ventricular wall motion abnormalities  -normal left ventricular diastolic function  -  mild left ventricular hypertrophy.    10/05/2022 Imaging   CT Chest, Abdomen, and Pelvis with  contrast IMPRESSION: 1. Stable examination without new or progressive finding in the chest, abdomen or pelvis to suggest recurrent or metastatic disease. 2. Stable tiny bilateral thoracic inlet lymph nodes. 3.  Aortic Atherosclerosis (ICD10-I70.0).     CHIEF COMPLIANT: Follow-up on Enhertu  HISTORY OF PRESENT ILLNESS:   History of Present Illness   The patient, who is currently undergoing treatment with Enhertu, reports experiencing fatigue and nausea. The nausea is not severe enough to induce vomiting but is persistent and occurs at any time, particularly at night. The patient has not taken any medication for the nausea due to concerns about constipation. The patient has tried ginger candy for relief but has not tried acupressure wristbands.  The patient also reports experiencing a dull headache after treatment, which she attributes to the chemicals in the brain. The patient has taken steps to reduce her workload and has applied for extended leave through FMLA due to the side effects of the treatment.  The patient also reports swelling in the legs and ankles, particularly after sitting for extended periods at work. The patient has a footstool but still experiences swelling by the end of the day. The patient has been prescribed a water pill for the swelling but has not started taking it yet.  The patient is also dealing with workplace issues related to COVID-19 safety measures. The patient has contracted COVID-19 twice from work and is concerned about the lack of adequate safety measures in place. The patient is considering different strategies to stay safe at work.         ALLERGIES:  is allergic to pollen extract, compazine [prochlorperazine], latex, codeine, diflucan [fluconazole], shellfish allergy, and betadine [povidone iodine].  MEDICATIONS:  Current Outpatient Medications  Medication Sig Dispense Refill   aspirin EC 81 MG tablet Take 1 tablet (81 mg total) by mouth daily.  Swallow whole. 30 tablet 11   cetirizine (ZYRTEC ALLERGY) 10 MG tablet Take 1 tablet (10 mg total) by mouth daily. 30 tablet 2   chlorthalidone (HYGROTON) 25 MG tablet Take 1 tablet (25 mg total) by mouth daily. 90 tablet 1   furosemide (LASIX) 20 MG tablet Take 1 tablet (20 mg total) by mouth as needed. TAKE 1 TABLET AS NEEDED FOR SWELLING OR SHORTNESS OF BREATH OR WEIGHT GAIN. 3 POUNDS WITHIN 24HR OR 5 POUNDS 7 DAYS (Patient not taking: Reported on 11/14/2022) 90 tablet 3   levonorgestrel (MIRENA, 52 MG,) 20 MCG/DAY IUD 1 each by Intrauterine route once.     lidocaine-prilocaine (EMLA) cream Apply to affected area once AS DIRECTED 30 g 3   linaclotide (LINZESS) 72 MCG capsule Take 1 capsule (72 mcg total) by mouth daily before breakfast. 90 capsule 1   potassium chloride SA (KLOR-CON M) 10 MEQ tablet Take 4 tablets (40 mEq total) by mouth daily. 120 tablet 3   rosuvastatin (CRESTOR) 40 MG tablet Take 1 tablet (40 mg total) by mouth daily. 90 tablet 3   valsartan (DIOVAN) 160 MG tablet Take 1 tablet (160 mg total) by mouth daily. 30 tablet 11   No current facility-administered medications for this visit.   Facility-Administered Medications Ordered in Other Visits  Medication Dose Route Frequency Provider Last Rate Last Admin   heparin lock flush 100 unit/mL  500 Units Intracatheter Once Serena Croissant, MD       sodium chloride flush (NS) 0.9 % injection 10 mL  10  mL Intracatheter Once Serena Croissant, MD        PHYSICAL EXAMINATION: ECOG PERFORMANCE STATUS: 1 - Symptomatic but completely ambulatory  Vitals:   01/30/23 0911 01/30/23 0912  BP: (!) 191/88 (!) 177/74  Pulse: 89   Resp: 18   Temp: 97.7 F (36.5 C)   SpO2: 100%    Filed Weights   01/30/23 0911  Weight: (!) 317 lb 9.6 oz (144.1 kg)      LABORATORY DATA:  I have reviewed the data as listed    Latest Ref Rng & Units 01/04/2023    8:46 AM 12/07/2022   10:58 AM 11/09/2022   10:27 AM  CMP  Glucose 70 - 99 mg/dL 97  97  95    BUN 6 - 20 mg/dL 8  9  12    Creatinine 0.44 - 1.00 mg/dL 1.61  0.96  0.45   Sodium 135 - 145 mmol/L 140  142  142   Potassium 3.5 - 5.1 mmol/L 3.3  3.4  3.2   Chloride 98 - 111 mmol/L 108  108  109   CO2 22 - 32 mmol/L 29  29  29    Calcium 8.9 - 10.3 mg/dL 8.9  9.1  8.9   Total Protein 6.5 - 8.1 g/dL 6.5  6.3  6.3   Total Bilirubin 0.3 - 1.2 mg/dL 1.4  1.5  1.6   Alkaline Phos 38 - 126 U/L 114  106  101   AST 15 - 41 U/L 29  26  26    ALT 0 - 44 U/L 15  14  13      Lab Results  Component Value Date   WBC 3.8 (L) 01/30/2023   HGB 11.3 (L) 01/30/2023   HCT 34.8 (L) 01/30/2023   MCV 85.9 01/30/2023   PLT 192 01/30/2023   NEUTROABS 1.9 01/30/2023    ASSESSMENT & PLAN:  Malignant neoplasm of lower-outer quadrant of right breast of female, estrogen receptor positive (HCC) 06/02/2020:Right lumpectomy ([facility]): invasive and in situ ductal carcinoma, 1.3cm, clear margins, 1 right axillary lymph node negative for carcinoma. ER 15% weak, PR-,HER-2 positive (3+) Ki67 80%.   Treatment plan: 1.  Adjuvant chemotherapy with Taxol Herceptin followed by Herceptin maintenance.  Stopped after 11 cycles of Taxol Herceptin maintenance completed 07/07/2021  2.  Adjuvant radiation therapy 11/23/2020-12/19/2020 3.  Adjuvant antiestrogen therapy with tamoxifen started October 2022 4.  CT angiogram 07/13/2021: Right supraclavicular lymph node 5 cm, right internal mammary lymph nodes 1.8 cm 5.  Metastatic breast cancer: June 2023:Right supraclavicular lymphadenopathy: Biopsy: Metastatic poorly differentiated adenocarcinoma, ER +40%, PR negative, HER2 positive 3+ PET CT scan 08/06/21: Ext Right Supra clav LN along with Internal mammary and Rt Upper Mediastinal and Rt Axillary LN -----------------------------------------------------------------------------------------------------  Current treatment: Enhertu started on 08/21/2021, today is cycle 24 (every 4 weeks) Enhertu toxicities: 1,.  Nausea: Mild 2.  Chemotherapy-induced anemia: Monitoring closely, hemoglobin 11.3 3.  Hospitalization 11/26/2021-11/28/2021: Dizziness and fatigue, esophageal dysmotility 4.   Chemo-induced peripheral neuropathy 5.  Fatigue   Echocardiogram 08/21/2022: EF 60 to 65%   CT chest abdomen pelvis 06/25/2022: Stable exam.  No new or progressive findings. CT CAP 10/04/2022: Stable findings without new or progressive disease   Leg swelling: On and off CT scan scheduled for 02/22/2023    No orders of the defined types were placed in this encounter.  The patient has a good understanding of the overall plan. she agrees with it. she will call with any problems that may develop before  the next visit here. Total time spent: 30 mins including face to face time and time spent for planning, charting and co-ordination of care   Tamsen Meek, MD 01/30/23

## 2023-01-30 NOTE — Progress Notes (Signed)
[facility] CSW Progress Note  Clinical Child psychotherapist received notice from [employer] that pt was approved for assistance on 01/30/2023. CSW notified pt.    Emeline Simpson E Ketsia Linebaugh, LCSW Clinical Social Worker [employer]

## 2023-02-01 ENCOUNTER — Other Ambulatory Visit: Payer: 59

## 2023-02-01 ENCOUNTER — Ambulatory Visit: Payer: 59

## 2023-02-01 ENCOUNTER — Ambulatory Visit: Payer: 59 | Admitting: Adult Health

## 2023-02-07 ENCOUNTER — Inpatient Hospital Stay: Payer: 59

## 2023-02-07 ENCOUNTER — Telehealth: Payer: Self-pay | Admitting: *Deleted

## 2023-02-07 ENCOUNTER — Inpatient Hospital Stay: Payer: 59 | Attending: Hematology and Oncology | Admitting: Physician Assistant

## 2023-02-07 ENCOUNTER — Encounter: Payer: Self-pay | Admitting: Physician Assistant

## 2023-02-07 ENCOUNTER — Ambulatory Visit (HOSPITAL_COMMUNITY)
Admission: RE | Admit: 2023-02-07 | Discharge: 2023-02-07 | Disposition: A | Discharge status: Home / Self Care | Payer: 59 | Source: Ambulatory Visit | Attending: Physician Assistant | Admitting: Physician Assistant

## 2023-02-07 VITALS — BP 173/89 | HR 88 | Temp 98.7°F | Resp 20 | Wt 310.5 lb

## 2023-02-07 DIAGNOSIS — C50511 Malignant neoplasm of lower-outer quadrant of right female breast: Secondary | ICD-10-CM | POA: Insufficient documentation

## 2023-02-07 DIAGNOSIS — R519 Headache, unspecified: Secondary | ICD-10-CM | POA: Insufficient documentation

## 2023-02-07 DIAGNOSIS — R509 Fever, unspecified: Secondary | ICD-10-CM

## 2023-02-07 DIAGNOSIS — Z95828 Presence of other vascular implants and grafts: Secondary | ICD-10-CM | POA: Diagnosis not present

## 2023-02-07 DIAGNOSIS — J45909 Unspecified asthma, uncomplicated: Secondary | ICD-10-CM | POA: Diagnosis not present

## 2023-02-07 DIAGNOSIS — Z5112 Encounter for antineoplastic immunotherapy: Secondary | ICD-10-CM | POA: Insufficient documentation

## 2023-02-07 DIAGNOSIS — Z833 Family history of diabetes mellitus: Secondary | ICD-10-CM | POA: Insufficient documentation

## 2023-02-07 DIAGNOSIS — I700 Atherosclerosis of aorta: Secondary | ICD-10-CM | POA: Insufficient documentation

## 2023-02-07 DIAGNOSIS — G4733 Obstructive sleep apnea (adult) (pediatric): Secondary | ICD-10-CM | POA: Insufficient documentation

## 2023-02-07 DIAGNOSIS — Z170 Estrogen receptor positive status [ER+]: Secondary | ICD-10-CM | POA: Diagnosis not present

## 2023-02-07 DIAGNOSIS — Z79899 Other long term (current) drug therapy: Secondary | ICD-10-CM | POA: Insufficient documentation

## 2023-02-07 DIAGNOSIS — Z8249 Family history of ischemic heart disease and other diseases of the circulatory system: Secondary | ICD-10-CM | POA: Diagnosis not present

## 2023-02-07 DIAGNOSIS — D649 Anemia, unspecified: Secondary | ICD-10-CM | POA: Diagnosis not present

## 2023-02-07 DIAGNOSIS — Z1722 Progesterone receptor negative status: Secondary | ICD-10-CM | POA: Insufficient documentation

## 2023-02-07 DIAGNOSIS — R5381 Other malaise: Secondary | ICD-10-CM | POA: Diagnosis not present

## 2023-02-07 DIAGNOSIS — Z5986 Financial insecurity: Secondary | ICD-10-CM | POA: Insufficient documentation

## 2023-02-07 DIAGNOSIS — Z8349 Family history of other endocrine, nutritional and metabolic diseases: Secondary | ICD-10-CM | POA: Insufficient documentation

## 2023-02-07 DIAGNOSIS — Z888 Allergy status to other drugs, medicaments and biological substances status: Secondary | ICD-10-CM | POA: Insufficient documentation

## 2023-02-07 DIAGNOSIS — N6315 Unspecified lump in the right breast, overlapping quadrants: Secondary | ICD-10-CM | POA: Insufficient documentation

## 2023-02-07 DIAGNOSIS — Z883 Allergy status to other anti-infective agents status: Secondary | ICD-10-CM | POA: Insufficient documentation

## 2023-02-07 DIAGNOSIS — Z825 Family history of asthma and other chronic lower respiratory diseases: Secondary | ICD-10-CM | POA: Insufficient documentation

## 2023-02-07 DIAGNOSIS — Z885 Allergy status to narcotic agent status: Secondary | ICD-10-CM | POA: Diagnosis not present

## 2023-02-07 DIAGNOSIS — Z801 Family history of malignant neoplasm of trachea, bronchus and lung: Secondary | ICD-10-CM | POA: Insufficient documentation

## 2023-02-07 DIAGNOSIS — I10 Essential (primary) hypertension: Secondary | ICD-10-CM | POA: Insufficient documentation

## 2023-02-07 DIAGNOSIS — R350 Frequency of micturition: Secondary | ICD-10-CM | POA: Insufficient documentation

## 2023-02-07 DIAGNOSIS — C50919 Malignant neoplasm of unspecified site of unspecified female breast: Secondary | ICD-10-CM

## 2023-02-07 DIAGNOSIS — R110 Nausea: Secondary | ICD-10-CM

## 2023-02-07 DIAGNOSIS — E7800 Pure hypercholesterolemia, unspecified: Secondary | ICD-10-CM | POA: Insufficient documentation

## 2023-02-07 DIAGNOSIS — E669 Obesity, unspecified: Secondary | ICD-10-CM | POA: Diagnosis not present

## 2023-02-07 DIAGNOSIS — Z9221 Personal history of antineoplastic chemotherapy: Secondary | ICD-10-CM | POA: Diagnosis not present

## 2023-02-07 DIAGNOSIS — Z803 Family history of malignant neoplasm of breast: Secondary | ICD-10-CM | POA: Insufficient documentation

## 2023-02-07 DIAGNOSIS — Z83438 Family history of other disorder of lipoprotein metabolism and other lipidemia: Secondary | ICD-10-CM | POA: Diagnosis not present

## 2023-02-07 DIAGNOSIS — J189 Pneumonia, unspecified organism: Secondary | ICD-10-CM

## 2023-02-07 LAB — CBC WITH DIFFERENTIAL (CANCER CENTER ONLY)
Abs Immature Granulocytes: 0.05 10*3/uL (ref 0.00–0.07)
Basophils Absolute: 0 10*3/uL (ref 0.0–0.1)
Basophils Relative: 0 %
Eosinophils Absolute: 0.1 10*3/uL (ref 0.0–0.5)
Eosinophils Relative: 1 %
HCT: 34.2 % — ABNORMAL LOW (ref 36.0–46.0)
Hemoglobin: 11 g/dL — ABNORMAL LOW (ref 12.0–15.0)
Immature Granulocytes: 1 %
Lymphocytes Relative: 6 %
Lymphs Abs: 0.5 10*3/uL — ABNORMAL LOW (ref 0.7–4.0)
MCH: 27.6 pg (ref 26.0–34.0)
MCHC: 32.2 g/dL (ref 30.0–36.0)
MCV: 85.9 fL (ref 80.0–100.0)
Monocytes Absolute: 0.9 10*3/uL (ref 0.1–1.0)
Monocytes Relative: 11 %
Neutro Abs: 6.5 10*3/uL (ref 1.7–7.7)
Neutrophils Relative %: 81 %
Platelet Count: 141 10*3/uL — ABNORMAL LOW (ref 150–400)
RBC: 3.98 MIL/uL (ref 3.87–5.11)
RDW: 16 % — ABNORMAL HIGH (ref 11.5–15.5)
WBC Count: 8 10*3/uL (ref 4.0–10.5)
nRBC: 0 % (ref 0.0–0.2)

## 2023-02-07 LAB — URINALYSIS, COMPLETE (UACMP) WITH MICROSCOPIC
Bacteria, UA: NONE SEEN
Bilirubin Urine: NEGATIVE
Glucose, UA: NEGATIVE mg/dL
Ketones, ur: NEGATIVE mg/dL
Leukocytes,Ua: NEGATIVE
Nitrite: NEGATIVE
Protein, ur: NEGATIVE mg/dL
Specific Gravity, Urine: 1.016 (ref 1.005–1.030)
pH: 6 (ref 5.0–8.0)

## 2023-02-07 LAB — CMP (CANCER CENTER ONLY)
ALT: 13 U/L (ref 0–44)
AST: 31 U/L (ref 15–41)
Albumin: 3.4 g/dL — ABNORMAL LOW (ref 3.5–5.0)
Alkaline Phosphatase: 102 U/L (ref 38–126)
Anion gap: 4 — ABNORMAL LOW (ref 5–15)
BUN: 10 mg/dL (ref 6–20)
CO2: 29 mmol/L (ref 22–32)
Calcium: 8.8 mg/dL — ABNORMAL LOW (ref 8.9–10.3)
Chloride: 103 mmol/L (ref 98–111)
Creatinine: 0.79 mg/dL (ref 0.44–1.00)
GFR, Estimated: >60 mL/min (ref >60)
Glucose, Bld: 134 mg/dL — ABNORMAL HIGH (ref 70–99)
Potassium: 3.4 mmol/L — ABNORMAL LOW (ref 3.5–5.1)
Sodium: 136 mmol/L (ref 135–145)
Total Bilirubin: 2.8 mg/dL — ABNORMAL HIGH (ref <1.2)
Total Protein: 6.4 g/dL — ABNORMAL LOW (ref 6.5–8.1)

## 2023-02-07 LAB — RESP PANEL BY RT-PCR (RSV, FLU A&B, COVID)  RVPGX2
Influenza A by PCR: NEGATIVE
Influenza B by PCR: NEGATIVE
Resp Syncytial Virus by PCR: NEGATIVE
SARS Coronavirus 2 by RT PCR: NEGATIVE

## 2023-02-07 MED ORDER — HEPARIN SOD (PORK) LOCK FLUSH 100 UNIT/ML IV SOLN
500.0000 [IU] | Freq: Once | INTRAVENOUS | Status: AC
  Administered 2023-02-07: 500 [IU]

## 2023-02-07 MED ORDER — SODIUM CHLORIDE 0.9 % IV SOLN: INTRAVENOUS | Status: DC

## 2023-02-07 MED ORDER — SODIUM CHLORIDE 0.9% FLUSH
10.0000 mL | Freq: Once | INTRAVENOUS | Status: AC
  Administered 2023-02-07: 10 mL

## 2023-02-07 MED ORDER — AZITHROMYCIN 250 MG PO TABS: ORAL_TABLET | ORAL | 0 refills | Status: AC

## 2023-02-07 MED ORDER — AMOXICILLIN-POT CLAVULANATE 875-125 MG PO TABS: 1.0000 | ORAL_TABLET | Freq: Two times a day (BID) | ORAL | 0 refills | Status: AC

## 2023-02-07 NOTE — Telephone Encounter (Signed)
Received call from pt with complaint of fever as high as 102.0, chills, severe fatigue, sore throat, and productive cough x 2 days.  Pt currently taking Tylenol to help control symptoms.  Per MD pt needing [facility] visit for further evaluation.  Appt scheduled, pt notified and verbalized understanding.

## 2023-02-07 NOTE — Progress Notes (Signed)
Symptom Management Consult Note [facility]    Patient Care Team: Dorothyann Peng, MD as PCP - General (Internal Medicine) Kathryne Hitch, MD as Consulting Physician (Orthopedic Surgery) Pershing Proud, RN as Oncology Nurse Navigator Donnelly Angelica, RN as Oncology Nurse Navigator Harriette Bouillon, MD as Consulting Physician (General Surgery) Serena Croissant, MD as Consulting Physician (Hematology and Oncology) Lonie Peak, MD as Attending Physician (Radiation Oncology)    Name / MRN / DOB: Pamela [name]  [id number]  [birth year]   Date of visit: 02/07/2023   Chief Complaint/Reason for visit: fever   Current Therapy: Enhertu  Last treatment:  Day 1   Cycle 16 on 01/30/23   ASSESSMENT & PLAN: Patient is a 57 y.o. female with oncologic history of malignant neoplasm of lower-outer quadrant of right breast of female, estrogen receptor positivefollowed by Dr. Pamelia Hoit.  I have viewed most recent oncology note and lab work.    #Malignant neoplasm of lower-outer quadrant of right breast of female, estrogen receptor positive - Next appointment with oncologist is 03/01/23   #Fever -Afebrile in clinic. Nontoxic appearing. Clear lung exam. No hypoxia. -CBC with normal WBC at 8.0, stable anemia, mild thrombocytopenia. CMP with elevated t bili 2.8 with normal liver enzymes. Abdomen is non tender on exam.  -Covid, flu, RSV testing is negative. UA does not show signs of infection. Urine culture collected. Blood cultures collected. -Chest xray is showing right upper lobe opacity concerning for pneumonia. Will treat with Augmentin and a Z-pak.    Strict ED precautions discussed should symptoms worsen.   Heme/Onc History: Oncology History  Malignant neoplasm of lower-outer quadrant of right breast of female, estrogen receptor positive (HCC)  05/03/2020 Initial Diagnosis   Screening mammogram showed a 1.0cm mass at the 6 o'clock position in the right breast.  Diagnostic mammogram and US showed the 1.2cm mass at the 6 o'clock position in the right breast. Biopsy showed IDC, grade 3, HER-2 positive (3+), ER 15% weak, PR-, Ki67 80%.   05/19/2020 Genetic Testing   Negative hereditary cancer genetic testing: no pathogenic variants detected in [facility] Breast STAT Panel and Common Hereditary Cancers Panel.  The report dates are May 19, 2020 (STAT) and May 23, 2020 (Common Hereditary).   The Common Hereditary Cancers Panel offered by [facility] includes sequencing and/or deletion duplication testing of the following 47 genes: APC, ATM, AXIN2, BARD1, BMPR1A, BRCA1, BRCA2, BRIP1, CDH1, CDK4, CDKN2A (p14ARF), CDKN2A (p16INK4a), CHEK2, CTNNA1, DICER1, EPCAM (Deletion/duplication testing only), GREM1 (promoter region deletion/duplication testing only), GREM1, HOXB13, KIT, MEN1, MLH1, MSH2, MSH3, MSH6, MUTYH, NBN, NF1, NHTL1, PALB2, PDGFRA, PMS2, POLD1, POLE, PTEN, RAD50, RAD51C, RAD51D, SDHA, SDHB, SDHC, SDHD, SMAD4, SMARCA4. STK11, TP53, TSC1, TSC2, and VHL.  The following genes were evaluated for sequence changes only: SDHA and HOXB13 c.251G>A variant only.es only: SDHA and HOXB13 c.251G>A variant only.   06/02/2020 Surgery   Right lumpectomy ([facility]): invasive and in situ ductal carcinoma, 1.3cm, clear margins, 1 right axillary lymph node negative for carcinoma.   06/02/2020 Cancer Staging   Staging form: Breast, AJCC 8th Edition - Pathologic stage from 06/02/2020: Stage IA (pT1c, pN0, cM0, G3, ER+, PR-, HER2+) - Signed by Loa Socks, NP on 06/15/2020 Stage prefix: Initial diagnosis Histologic grading system: 3 grade system   07/08/2020 - 10/21/2020 Chemotherapy   Taxol Herceptin followed by Herceptin maintenance   04/14/2021 -  Anti-estrogen oral therapy   Tamoxifen 10 mg   07/13/2021 Imaging   CT angiogram: Right supraclavicular  lymph node 5 cm, right internal mammary lymph nodes 1.8 cm    08/06/2021 PET scan   PET CT scan: Ext Right Supra clav LN  along with Internal mammary and Rt Upper Mediastinal and Rt Axillary LN   08/2021 Initial Biopsy   Right supraclavicular lymphadenopathy: Biopsy: Metastatic poorly differentiated adenocarcinoma, ER +40%, PR negative, HER2 positive 3+    08/21/2021 -  Chemotherapy   Enhertu every 3 weeks    03/08/2022 Imaging   CT chest/abdomen/pelvis  1. No new or progressive findings in the chest, abdomen or pelvis. 2. Signs of RIGHT breast lumpectomy with areas of central fat necrosis unchanged from previous imaging. 3. Stable small lymph nodes throughout the chest, largest with area of soft tissue about surgical clips in the RIGHT axilla. 4. Stable appearance of thoracic inlet lymph nodes, not shown to be hypermetabolic on prior PET imaging. 5. IUD in-situ. 6. Aortic atherosclerosis.   08/21/2022 Imaging   Echocardiogram  -normal LVEF at 60-65%. -no left ventricular wall motion abnormalities  -normal left ventricular diastolic function  -mild left ventricular hypertrophy.    10/05/2022 Imaging   CT Chest, Abdomen, and Pelvis with contrast IMPRESSION: 1. Stable examination without new or progressive finding in the chest, abdomen or pelvis to suggest recurrent or metastatic disease. 2. Stable tiny bilateral thoracic inlet lymph nodes. 3.  Aortic Atherosclerosis (ICD10-I70.0).       Interval history-: Discussed the use of AI scribe software for clinical note transcription with the patient, who gave verbal consent to proceed.   Pamela [name] is a 57 y.o. female with oncologic history as above presenting to [facility] today with chief complaint of fever. Patient presents unaccompanied to visit today.  Patient reports a constellation of symptoms that began x 3 days ago with a sore throat. The next day she experienced a headache and general malaise, characterized by a lack of energy and frequent urination. The patient's condition deteriorated on Wednesday, with the onset of a fever that peaked at 102  degrees Fahrenheit, headache, body aches, sore throat, and ear popping. The patient also reported increased fatigue, leading to extended periods of sleep. She denies any sinus pressure.  Despite taking over-the-counter medications such as Tylenol and Coricidin Max, the patient's symptoms persisted. The patient reported coughing up colored sputum and noted blood when blowing her nose, attributing this to frequent coughing and nose blowing. The patient also reported increased urinary frequency, but denied dysuria or symptoms suggestive of a urinary tract infection.  The patient reported shortness of breath and wheezing, particularly when lying down, and has been using an inhaler for her history of asthma. The patient also noted an increase in blood pressure since her last treatment, despite being on medication for hypertension. The patient had not received the flu shot this year and had potential exposure to a sick contact at work. The patient's symptoms have not improved significantly, and she continues to feel unwell.  ROS  All other systems are reviewed and are negative for acute change except as noted in the HPI.    Allergies  Allergen Reactions   Pollen Extract Shortness Of Breath   Compazine [Prochlorperazine] Anxiety    IV compazine causes severe anxiety attack   Latex Rash   Codeine Hives   Diflucan [Fluconazole] Hives   Shellfish Allergy Hives   Betadine [Povidone Iodine] Rash     Past Medical History:  Diagnosis Date   Anxiety    Asthma    Back pain  Breast cancer (HCC)    Constipation    DUB (dysfunctional uterine bleeding) 2007   Edema of both lower extremities    Elevated cholesterol    Family history of breast cancer 05/12/2020   Family history of lung cancer 05/12/2020   Food allergy    H/O menorrhagia 05/01/2006   High cholesterol    History of ovarian cyst 2007   Hypertension 03/31/2005   Increased BMI 07/11/2006   Joint pain    Migraine    N&V (nausea and  vomiting)    Obesity 2007   Obstructive sleep apnea syndrome, moderate 10/27/2013   no cpap   Personal history of chemotherapy    Personal history of radiation therapy    Sleep apnea    SOB (shortness of breath)    Vitamin D deficiency    Vitamin D deficiency    Weight loss 07/11/2006     Past Surgical History:  Procedure Laterality Date   BREAST LUMPECTOMY     BREAST LUMPECTOMY WITH RADIOACTIVE SEED AND SENTINEL LYMPH NODE BIOPSY Right 06/02/2020   Procedure: RIGHT BREAST LUMPECTOMY WITH RADIOACTIVE SEED AND RIGHT SENTINEL LYMPH NODE BIOPSY;  Surgeon: Harriette Bouillon, MD;  Location: [facility];  Service: General;  Laterality: Right;   BREAST REDUCTION SURGERY  05/04/1991   CESAREAN SECTION     HYSTEROSCOPY  05/01/2006   PORTACATH PLACEMENT Right 06/02/2020   Procedure: INSERTION PORT-A-CATH;  Surgeon: Harriette Bouillon, MD;  Location: [facility];  Service: General;  Laterality: Right;   REDUCTION MAMMAPLASTY      Social History   Socioeconomic History   Marital status: Single    Spouse name: Not on file   Number of children: Not on file   Years of education: Not on file   Highest education level: Not on file  Occupational History   Occupation: Child psychotherapist  Tobacco Use   Smoking status: Never   Smokeless tobacco: Never  Vaping Use   Vaping status: Never Used  Substance and Sexual Activity   Alcohol use: No   Drug use: Not Currently    Frequency: 2.0 times per week   Sexual activity: Not Currently    Birth control/protection: I.U.D.    Comment: Mirena  Other Topics Concern   Not on file  Social History Narrative   Not on file   Social Determinants of Health   Financial Resource Strain: High Risk (03/14/2022)   Overall Financial Resource Strain (CARDIA)    Difficulty of Paying Living Expenses: Hard  Food Insecurity: Not on file  Transportation Needs: Not on file  Physical Activity: Not on file  Stress: Not on file  Social  Connections: Not on file  Intimate Partner Violence: Not on file    Family History  Problem Relation Age of Onset   Hypertension Mother    High Cholesterol Mother    Thyroid disease Mother    Cancer Mother    Sleep apnea Mother    Hypertension Father    Heart attack Father    Sudden death Father    Diabetes Maternal Grandmother    Bone cancer Maternal Grandfather        dx after 7   Breast cancer Maternal Aunt        dx 30s   Lung cancer Maternal Aunt        dx after 50     Current Outpatient Medications:    amoxicillin-clavulanate (AUGMENTIN) 875-125 MG tablet, Take 1 tablet by mouth 2 (two)  times daily for 7 days., Disp: 14 tablet, Rfl: 0   azithromycin (ZITHROMAX) 250 MG tablet, Take 2 tablets (500 mg total) by mouth daily for 1 day, THEN 1 tablet (250 mg total) daily for 4 days., Disp: 6 tablet, Rfl: 0   aspirin EC 81 MG tablet, Take 1 tablet (81 mg total) by mouth daily. Swallow whole., Disp: 30 tablet, Rfl: 11   cetirizine (ZYRTEC ALLERGY) 10 MG tablet, Take 1 tablet (10 mg total) by mouth daily., Disp: 30 tablet, Rfl: 2   chlorthalidone (HYGROTON) 25 MG tablet, Take 1 tablet (25 mg total) by mouth daily., Disp: 90 tablet, Rfl: 1   furosemide (LASIX) 20 MG tablet, Take 1 tablet (20 mg total) by mouth as needed. TAKE 1 TABLET AS NEEDED FOR SWELLING OR SHORTNESS OF BREATH OR WEIGHT GAIN. 3 POUNDS WITHIN 24HR OR 5 POUNDS 7 DAYS (Patient not taking: Reported on 11/14/2022), Disp: 90 tablet, Rfl: 3   levonorgestrel (MIRENA, 52 MG,) 20 MCG/DAY IUD, 1 each by Intrauterine route once., Disp: , Rfl:    lidocaine-prilocaine (EMLA) cream, Apply to affected area once AS DIRECTED, Disp: 30 g, Rfl: 3   linaclotide (LINZESS) 72 MCG capsule, Take 1 capsule (72 mcg total) by mouth daily before breakfast., Disp: 90 capsule, Rfl: 1   potassium chloride SA (KLOR-CON M) 10 MEQ tablet, Take 4 tablets (40 mEq total) by mouth daily., Disp: 120 tablet, Rfl: 3   rosuvastatin (CRESTOR) 40 MG tablet,  Take 1 tablet (40 mg total) by mouth daily., Disp: 90 tablet, Rfl: 3   valsartan (DIOVAN) 160 MG tablet, Take 1 tablet (160 mg total) by mouth daily., Disp: 30 tablet, Rfl: 11  Current Facility-Administered Medications:    0.9 %  sodium chloride infusion, , Intravenous, Continuous, Walisiewicz, Charlean Carneal E, PA-C, Stopped at 02/07/23 1417  Facility-Administered Medications Ordered in Other Visits:    heparin lock flush 100 unit/mL, 500 Units, Intracatheter, Once, Serena Croissant, MD   sodium chloride flush (NS) 0.9 % injection 10 mL, 10 mL, Intracatheter, Once, Serena Croissant, MD  PHYSICAL EXAM: ECOG FS:1 - Symptomatic but completely ambulatory    Vitals:   02/07/23 1200  BP: (!) 173/89  Pulse: 88  Resp: 20  Temp: 98.7 F (37.1 C)  SpO2: 100%  Weight: (!) 310 lb 8 oz (140.8 kg)   Physical Exam Vitals and nursing note reviewed.  Constitutional:      Appearance: She is not ill-appearing or toxic-appearing.  HENT:     Head: Normocephalic.  Eyes:     Conjunctiva/sclera: Conjunctivae normal.  Cardiovascular:     Rate and Rhythm: Normal rate and regular rhythm.     Pulses: Normal pulses.     Heart sounds: Normal heart sounds.  Pulmonary:     Effort: Pulmonary effort is normal.     Breath sounds: Normal breath sounds.  Chest:     Comments: PAC in right upper chest without signs of infection Abdominal:     General: There is no distension.  Musculoskeletal:     Cervical back: Normal range of motion.  Skin:    General: Skin is warm and dry.  Neurological:     Mental Status: She is alert.        LABORATORY DATA: I have reviewed the data as listed    Latest Ref Rng & Units 02/07/2023   12:33 PM 01/30/2023    8:50 AM 01/04/2023    8:46 AM  CBC  WBC 4.0 - 10.5 K/uL 8.0  3.8  3.8   Hemoglobin 12.0 - 15.0 g/dL 08.6  57.8  46.9   Hematocrit 36.0 - 46.0 % 34.2  34.8  34.2   Platelets 150 - 400 K/uL 141  192  160         Latest Ref Rng & Units 02/07/2023   12:33 PM  01/30/2023    8:50 AM 01/04/2023    8:46 AM  CMP  Glucose 70 - 99 mg/dL 629  528  97   BUN 6 - 20 mg/dL 10  8  8    Creatinine 0.44 - 1.00 mg/dL 4.13  2.44  0.10   Sodium 135 - 145 mmol/L 136  141  140   Potassium 3.5 - 5.1 mmol/L 3.4  3.1  3.3   Chloride 98 - 111 mmol/L 103  107  108   CO2 22 - 32 mmol/L 29  29  29    Calcium 8.9 - 10.3 mg/dL 8.8  8.8  8.9   Total Protein 6.5 - 8.1 g/dL 6.4  6.4  6.5   Total Bilirubin <1.2 mg/dL 2.8  1.5  1.4   Alkaline Phos 38 - 126 U/L 102  121  114   AST 15 - 41 U/L 31  27  29    ALT 0 - 44 U/L 13  14  15         RADIOGRAPHIC STUDIES (from last 24 hours if applicable) I have personally reviewed the radiological images as listed and agreed with the findings in the report. DG Chest 2 View  Result Date: 02/07/2023 CLINICAL DATA:  Fever Hypertension Shortness of breath EXAM: CHEST - 2 VIEW COMPARISON:  11/26/2021 FINDINGS: Mediastinal silhouette and pulmonary vasculature are within normal limits. Heart is mildly enlarged. Right chest port is unchanged terminating near the cavoatrial junction. Right upper lobe airspace opacity most likely due to pneumonia. Left lung remains well aerated. IMPRESSION: 1. Right upper lobe pneumonia. 2. Mild cardiomegaly. Electronically Signed   By: Acquanetta Belling M.D.   On: 02/07/2023 17:16        Visit Diagnosis: 1. Fever, unspecified   2. Malignant neoplasm of lower-outer quadrant of right breast of female, estrogen receptor positive (HCC)   3. Port-A-Cath in place   4. Malignant neoplasm of female breast, unspecified estrogen receptor status, unspecified laterality, unspecified site of breast (HCC)   5. Nausea without vomiting   6. Community acquired pneumonia of right upper lobe of lung      Orders Placed This Encounter  Procedures   Resp panel by RT-PCR (RSV, Flu A&B, Covid) Anterior Nasal Swab    Standing Status:   Future    Number of Occurrences:   1    Standing Expiration Date:   02/07/2024   Urine Culture     Standing Status:   Future    Number of Occurrences:   1    Standing Expiration Date:   02/07/2024   Blood culture (routine single)    Standing Status:   Standing    Number of Occurrences:   1   DG Chest 2 View    Standing Status:   Future    Number of Occurrences:   1    Standing Expiration Date:   02/07/2024    Order Specific Question:   Reason for Exam (SYMPTOM  OR DIAGNOSIS REQUIRED)    Answer:   fever workup    Order Specific Question:   Is patient pregnant?    Answer:   No    Order Specific Question:  Preferred imaging location?    Answer:   [facility]   CBC with Differential (Cancer Center Only)    Standing Status:   Future    Number of Occurrences:   1    Standing Expiration Date:   02/07/2024   CMP (Cancer Center only)    Standing Status:   Future    Number of Occurrences:   1    Standing Expiration Date:   02/07/2024   Urinalysis, Complete w Microscopic    Standing Status:   Future    Number of Occurrences:   1    Standing Expiration Date:   02/07/2024    All questions were answered. The patient knows to call the clinic with any problems, questions or concerns. No barriers to learning was detected.  A total of more than 30 minutes were spent on this encounter with face-to-face time and non-face-to-face time, including preparing to see the patient, ordering tests and/or medications, counseling the patient and coordination of care as outlined above.    Thank you for allowing me to participate in the care of this patient.    Shanon Ace, PA-C Department of Hematology/Oncology [facility] at [facility] Phone: [phone]  [phone]    02/07/2023 6:00 PM

## 2023-02-08 ENCOUNTER — Ambulatory Visit (HOSPITAL_COMMUNITY): Payer: 59

## 2023-02-08 LAB — URINE CULTURE

## 2023-02-12 LAB — CULTURE, BLOOD (SINGLE)
Culture: NO GROWTH
Special Requests: ADEQUATE

## 2023-02-13 ENCOUNTER — Other Ambulatory Visit: Payer: Self-pay | Admitting: Physician Assistant

## 2023-02-13 ENCOUNTER — Telehealth: Payer: Self-pay

## 2023-02-13 MED ORDER — BENZONATATE 100 MG PO CAPS: 100.0000 mg | ORAL_CAPSULE | Freq: Three times a day (TID) | ORAL | 0 refills | Status: DC | PRN

## 2023-02-13 NOTE — Progress Notes (Signed)
Patient requested refill for [facility]. Prescription sent to her pharmacy.

## 2023-02-13 NOTE — Telephone Encounter (Signed)
RN called to inform patient that new rx of Tessalon Perles written and sent to her pharmacy by Namon Cirri, PA-C. Patient verified understanding and denies needing anything else at this time.

## 2023-02-22 ENCOUNTER — Ambulatory Visit (HOSPITAL_COMMUNITY)
Admission: RE | Admit: 2023-02-22 | Discharge: 2023-02-22 | Disposition: A | Discharge status: Home / Self Care | Payer: 59 | Source: Ambulatory Visit | Attending: Adult Health | Admitting: Adult Health

## 2023-02-22 DIAGNOSIS — Z170 Estrogen receptor positive status [ER+]: Secondary | ICD-10-CM | POA: Insufficient documentation

## 2023-02-22 DIAGNOSIS — C50511 Malignant neoplasm of lower-outer quadrant of right female breast: Secondary | ICD-10-CM | POA: Insufficient documentation

## 2023-02-22 MED ORDER — IOHEXOL 300 MG/ML  SOLN
100.0000 mL | Freq: Once | INTRAMUSCULAR | Status: AC | PRN
  Administered 2023-02-22: 100 mL via INTRAVENOUS

## 2023-02-22 MED ORDER — HEPARIN SOD (PORK) LOCK FLUSH 100 UNIT/ML IV SOLN
500.0000 [IU] | Freq: Once | INTRAVENOUS | Status: AC
  Administered 2023-02-22: 500 [IU] via INTRAVENOUS

## 2023-02-28 ENCOUNTER — Ambulatory Visit (HOSPITAL_COMMUNITY): Payer: 59 | Attending: Internal Medicine

## 2023-02-28 MED FILL — Fosaprepitant Dimeglumine For IV Infusion 150 MG (Base Eq): INTRAVENOUS | Qty: 5 | Status: AC

## 2023-03-01 ENCOUNTER — Telehealth: Payer: Self-pay | Admitting: *Deleted

## 2023-03-01 ENCOUNTER — Inpatient Hospital Stay: Payer: 59 | Admitting: Hematology and Oncology

## 2023-03-01 ENCOUNTER — Inpatient Hospital Stay: Payer: 59

## 2023-03-01 ENCOUNTER — Other Ambulatory Visit: Payer: Self-pay | Admitting: Pharmacist

## 2023-03-01 DIAGNOSIS — C50511 Malignant neoplasm of lower-outer quadrant of right female breast: Secondary | ICD-10-CM

## 2023-03-01 NOTE — Assessment & Plan Note (Deleted)
06/02/2020:Right lumpectomy ([facility]): invasive and in situ ductal carcinoma, 1.3cm, clear margins, 1 right axillary lymph node negative for carcinoma. ER 15% weak, PR-,HER-2 positive (3+) Ki67 80%.   Treatment plan: 1.  Adjuvant chemotherapy with Taxol Herceptin followed by Herceptin maintenance.  Stopped after 11 cycles of Taxol Herceptin maintenance completed 07/07/2021  2.  Adjuvant radiation therapy 11/23/2020-12/19/2020 3.  Adjuvant antiestrogen therapy with tamoxifen started October 2022 4.  CT angiogram 07/13/2021: Right supraclavicular lymph node 5 cm, right internal mammary lymph nodes 1.8 cm 5.  Metastatic breast cancer: June 2023:Right supraclavicular lymphadenopathy: Biopsy: Metastatic poorly differentiated adenocarcinoma, ER +40%, PR negative, HER2 positive 3+ PET CT scan 08/06/21: Ext Right Supra clav LN along with Internal mammary and Rt Upper Mediastinal and Rt Axillary LN -----------------------------------------------------------------------------------------------------  Current treatment: Enhertu started on 08/21/2021, today is cycle 25 (every 4 weeks) Enhertu toxicities: 1,.  Nausea: Mild 2. Chemotherapy-induced anemia: Monitoring closely, hemoglobin 11.3 3.  Hospitalization 11/26/2021-11/28/2021: Dizziness and fatigue, esophageal dysmotility 4.   Chemo-induced peripheral neuropathy 5.  Fatigue   Echocardiogram 08/21/2022: EF 60 to 65%   CT chest abdomen pelvis 06/25/2022: Stable exam.  No new or progressive findings. CT CAP 10/04/2022: Stable findings without new or progressive disease CT CAP 02/22/2023: Groundglass opacity RUL favored infectious/inflammatory, similar size of lymph nodes in the right axilla and subcentimeter similar lymph nodes in the thorax.   Leg swelling: On and off

## 2023-03-01 NOTE — Telephone Encounter (Signed)
RN attempt x1 to contact pt regarding no show for appt today.  No answer.  Unable to LVM due to VM not being available.

## 2023-03-01 NOTE — Telephone Encounter (Signed)
Received call from pt with complaint of fatigue, vomiting, cough, decreased appetite, and body aches x 3 days.  MD out of office.  RN reached out to [facility] for possible appt.  [facility] stating pt needing to be seen in ED for further evaluation.  Pt politely declined ED at this time but will go over the weekend if symptoms worsen.

## 2023-03-05 MED FILL — Fosaprepitant Dimeglumine For IV Infusion 150 MG (Base Eq): INTRAVENOUS | Qty: 5 | Status: AC

## 2023-03-07 ENCOUNTER — Inpatient Hospital Stay: Payer: 59

## 2023-03-07 ENCOUNTER — Inpatient Hospital Stay: Payer: 59 | Attending: Hematology and Oncology

## 2023-03-07 ENCOUNTER — Encounter: Payer: Self-pay | Admitting: Adult Health

## 2023-03-07 ENCOUNTER — Inpatient Hospital Stay: Payer: 59 | Admitting: Adult Health

## 2023-03-07 VITALS — BP 156/93 | HR 88 | Temp 98.4°F | Resp 19 | Wt 298.4 lb

## 2023-03-07 DIAGNOSIS — Z833 Family history of diabetes mellitus: Secondary | ICD-10-CM | POA: Insufficient documentation

## 2023-03-07 DIAGNOSIS — C50919 Malignant neoplasm of unspecified site of unspecified female breast: Secondary | ICD-10-CM

## 2023-03-07 DIAGNOSIS — R062 Wheezing: Secondary | ICD-10-CM | POA: Insufficient documentation

## 2023-03-07 DIAGNOSIS — E782 Mixed hyperlipidemia: Secondary | ICD-10-CM | POA: Diagnosis not present

## 2023-03-07 DIAGNOSIS — Z170 Estrogen receptor positive status [ER+]: Secondary | ICD-10-CM

## 2023-03-07 DIAGNOSIS — R110 Nausea: Secondary | ICD-10-CM

## 2023-03-07 DIAGNOSIS — C50511 Malignant neoplasm of lower-outer quadrant of right female breast: Secondary | ICD-10-CM

## 2023-03-07 DIAGNOSIS — J45909 Unspecified asthma, uncomplicated: Secondary | ICD-10-CM | POA: Diagnosis not present

## 2023-03-07 DIAGNOSIS — I700 Atherosclerosis of aorta: Secondary | ICD-10-CM | POA: Diagnosis not present

## 2023-03-07 DIAGNOSIS — I10 Essential (primary) hypertension: Secondary | ICD-10-CM | POA: Diagnosis not present

## 2023-03-07 DIAGNOSIS — Z95828 Presence of other vascular implants and grafts: Secondary | ICD-10-CM

## 2023-03-07 DIAGNOSIS — Z8249 Family history of ischemic heart disease and other diseases of the circulatory system: Secondary | ICD-10-CM | POA: Insufficient documentation

## 2023-03-07 DIAGNOSIS — Z79899 Other long term (current) drug therapy: Secondary | ICD-10-CM | POA: Insufficient documentation

## 2023-03-07 DIAGNOSIS — G4733 Obstructive sleep apnea (adult) (pediatric): Secondary | ICD-10-CM | POA: Diagnosis not present

## 2023-03-07 DIAGNOSIS — R052 Subacute cough: Secondary | ICD-10-CM | POA: Diagnosis not present

## 2023-03-07 DIAGNOSIS — Z5986 Financial insecurity: Secondary | ICD-10-CM | POA: Diagnosis not present

## 2023-03-07 DIAGNOSIS — R5383 Other fatigue: Secondary | ICD-10-CM | POA: Insufficient documentation

## 2023-03-07 DIAGNOSIS — Z5112 Encounter for antineoplastic immunotherapy: Secondary | ICD-10-CM | POA: Insufficient documentation

## 2023-03-07 DIAGNOSIS — Z8349 Family history of other endocrine, nutritional and metabolic diseases: Secondary | ICD-10-CM | POA: Diagnosis not present

## 2023-03-07 DIAGNOSIS — Z825 Family history of asthma and other chronic lower respiratory diseases: Secondary | ICD-10-CM | POA: Insufficient documentation

## 2023-03-07 DIAGNOSIS — R053 Chronic cough: Secondary | ICD-10-CM | POA: Insufficient documentation

## 2023-03-07 DIAGNOSIS — Z1722 Progesterone receptor negative status: Secondary | ICD-10-CM | POA: Insufficient documentation

## 2023-03-07 DIAGNOSIS — Z801 Family history of malignant neoplasm of trachea, bronchus and lung: Secondary | ICD-10-CM | POA: Insufficient documentation

## 2023-03-07 DIAGNOSIS — Z9221 Personal history of antineoplastic chemotherapy: Secondary | ICD-10-CM | POA: Diagnosis not present

## 2023-03-07 DIAGNOSIS — N6315 Unspecified lump in the right breast, overlapping quadrants: Secondary | ICD-10-CM | POA: Diagnosis not present

## 2023-03-07 DIAGNOSIS — Z803 Family history of malignant neoplasm of breast: Secondary | ICD-10-CM | POA: Diagnosis not present

## 2023-03-07 DIAGNOSIS — Z83438 Family history of other disorder of lipoprotein metabolism and other lipidemia: Secondary | ICD-10-CM | POA: Insufficient documentation

## 2023-03-07 LAB — CBC WITH DIFFERENTIAL (CANCER CENTER ONLY)
Abs Immature Granulocytes: 0.01 10*3/uL (ref 0.00–0.07)
Basophils Absolute: 0 10*3/uL (ref 0.0–0.1)
Basophils Relative: 1 %
Eosinophils Absolute: 0.3 10*3/uL (ref 0.0–0.5)
Eosinophils Relative: 9 %
HCT: 39 % (ref 36.0–46.0)
Hemoglobin: 12.7 g/dL (ref 12.0–15.0)
Immature Granulocytes: 0 %
Lymphocytes Relative: 38 %
Lymphs Abs: 1.4 10*3/uL (ref 0.7–4.0)
MCH: 27.7 pg (ref 26.0–34.0)
MCHC: 32.6 g/dL (ref 30.0–36.0)
MCV: 85.2 fL (ref 80.0–100.0)
Monocytes Absolute: 0.5 10*3/uL (ref 0.1–1.0)
Monocytes Relative: 12 %
Neutro Abs: 1.5 10*3/uL — ABNORMAL LOW (ref 1.7–7.7)
Neutrophils Relative %: 40 %
Platelet Count: 152 10*3/uL (ref 150–400)
RBC: 4.58 MIL/uL (ref 3.87–5.11)
RDW: 16.8 % — ABNORMAL HIGH (ref 11.5–15.5)
WBC Count: 3.6 10*3/uL — ABNORMAL LOW (ref 4.0–10.5)
nRBC: 0 % (ref 0.0–0.2)

## 2023-03-07 LAB — CMP (CANCER CENTER ONLY)
ALT: 33 U/L (ref 0–44)
AST: 61 U/L — ABNORMAL HIGH (ref 15–41)
Albumin: 3.4 g/dL — ABNORMAL LOW (ref 3.5–5.0)
Alkaline Phosphatase: 89 U/L (ref 38–126)
Anion gap: 5 (ref 5–15)
BUN: 5 mg/dL — ABNORMAL LOW (ref 6–20)
CO2: 31 mmol/L (ref 22–32)
Calcium: 9 mg/dL (ref 8.9–10.3)
Chloride: 105 mmol/L (ref 98–111)
Creatinine: 0.68 mg/dL (ref 0.44–1.00)
GFR, Estimated: >60 mL/min (ref >60)
Glucose, Bld: 125 mg/dL — ABNORMAL HIGH (ref 70–99)
Potassium: 3.2 mmol/L — ABNORMAL LOW (ref 3.5–5.1)
Sodium: 141 mmol/L (ref 135–145)
Total Bilirubin: 1.2 mg/dL (ref 0.0–1.2)
Total Protein: 6.7 g/dL (ref 6.5–8.1)

## 2023-03-07 MED ORDER — SODIUM CHLORIDE 0.9 % IV SOLN
INTRAVENOUS | Status: DC
Start: 2023-03-07 — End: 2023-03-07

## 2023-03-07 MED ORDER — ALBUTEROL SULFATE HFA 108 (90 BASE) MCG/ACT IN AERS: 2.0000 | INHALATION_SPRAY | Freq: Four times a day (QID) | RESPIRATORY_TRACT | 2 refills | Status: AC | PRN

## 2023-03-07 MED ORDER — POTASSIUM CHLORIDE 10 MEQ/100ML IV SOLN
10.0000 meq | INTRAVENOUS | Status: AC
Start: 2023-03-07 — End: 2023-03-07
  Administered 2023-03-07 (×2): 10 meq via INTRAVENOUS
  Filled 2023-03-07 (×2): qty 100

## 2023-03-07 MED ORDER — SODIUM CHLORIDE 0.9% FLUSH
3.0000 mL | Freq: Once | INTRAVENOUS | Status: AC | PRN
  Administered 2023-03-07: 10 mL

## 2023-03-07 MED ORDER — AEROCHAMBER PLUS FLO-VU MISC: 0 refills | Status: AC

## 2023-03-07 MED ORDER — HEPARIN SOD (PORK) LOCK FLUSH 100 UNIT/ML IV SOLN
250.0000 [IU] | Freq: Once | INTRAVENOUS | Status: AC | PRN
  Administered 2023-03-07: 500 [IU]

## 2023-03-07 MED ORDER — CYANOCOBALAMIN 1000 MCG/ML IJ SOLN
1000.0000 ug | Freq: Once | INTRAMUSCULAR | Status: AC
  Administered 2023-03-07: 1000 ug via INTRAMUSCULAR
  Filled 2023-03-07: qty 1

## 2023-03-07 MED ORDER — SODIUM CHLORIDE 0.9% FLUSH
10.0000 mL | Freq: Once | INTRAVENOUS | Status: AC
  Administered 2023-03-07: 10 mL

## 2023-03-07 MED ORDER — SODIUM CHLORIDE 0.9 % IV SOLN
INTRAVENOUS | Status: AC
Start: 2023-03-07 — End: 2023-03-07

## 2023-03-07 MED ORDER — BENZONATATE 100 MG PO CAPS: 100.0000 mg | ORAL_CAPSULE | Freq: Three times a day (TID) | ORAL | 0 refills | Status: DC | PRN

## 2023-03-07 MED ORDER — BROMPHENIRAMINE-PHENYLEPHRINE 1-2.5 MG/5ML PO ELIX: 5.0000 mL | ORAL_SOLUTION | Freq: Four times a day (QID) | ORAL | 0 refills | Status: DC | PRN

## 2023-03-07 NOTE — Patient Instructions (Signed)

## 2023-03-07 NOTE — Assessment & Plan Note (Signed)
 06/02/2020:Right lumpectomy ([facility]): invasive and in situ ductal carcinoma, 1.3cm, clear margins, 1 right axillary lymph node negative for carcinoma. ER 15% weak, PR-,HER-2 positive (3+) Ki67 80%.   Treatment plan: 1.  Adjuvant chemotherapy with Taxol  Herceptin  followed by Herceptin  maintenance.  Stopped after 11 cycles of Taxol  Herceptin  maintenance completed 07/07/2021  2.  Adjuvant radiation therapy 11/23/2020-12/19/2020 3.  Adjuvant antiestrogen therapy with tamoxifen  started October 2022 4.  CT angiogram 07/13/2021: Right supraclavicular lymph node 5 cm, right internal mammary lymph nodes 1.8 cm 5.  Metastatic breast cancer: June 2023:Right supraclavicular lymphadenopathy: Biopsy: Metastatic poorly differentiated adenocarcinoma, ER +40%, PR negative, HER2 positive 3+ PET CT scan 08/06/21: Ext Right Supra clav LN along with Internal mammary and Rt Upper Mediastinal and Rt Axillary LN    CT chest abdomen pelvis 06/25/2022: Stable exam.  No new or progressive findings. CT CAP 10/04/2022: Stable findings without new or progressive disease CT CAP 02/22/2023: Stable findings without new or progressive disease.  Pneumonia that was noted on chest x-ray from earlier in the month was again noted. -----------------------------------------------------------------------------------------------------  Current treatment: Enhertu  started on 08/21/2021  Metastatic Breast Cancer Stable disease on recent imaging. No new symptoms related to cancer.  No signs of breast cancer progression. -Continue current treatment regimen. -IV fluids today with potassium -Hold therapy treatment today and proceed with next week as she is continuing to clinically improve from having the flu  Recent Pneumonia Resolved with antibiotics. No current symptoms of infection. -Repeat imaging in 3 months with CT chest to ensure resolution.  Recent Flu-like Illness Symptoms of fever, chills, cough, and nausea. Last fever on 03/03/2023.  Symptoms improving. -Continue supportive care. -Call if fever recurs for consideration of different antibiotic.  Persistent Cough Likely post-infectious or related to post-nasal drip. -Prescribe albuterol  inhaler and cough syrup for patient to use. -Prescribe Tessalon  Perles. -Consider use of Mucinex  for expectoration.  We will see her back in 1 week for labs, follow-up, and her next treatment.

## 2023-03-07 NOTE — Progress Notes (Signed)
 [facility] Cancer Follow up:    [name], MD [address] [place] [address]   DIAGNOSIS:  Cancer Staging  Malignant neoplasm of lower-outer quadrant of right breast of female, estrogen receptor positive (HCC) Staging form: Breast, AJCC 8th Edition - Clinical stage from 05/11/2020: Stage IA (cT1b, cN0, cM0, G3, ER+, PR-, HER2+) - Signed by Odean Potts, MD on 05/11/2020 Stage prefix: Initial diagnosis - Pathologic stage from 06/02/2020: Stage IA (pT1c, pN0, cM0, G3, ER+, PR-, HER2+) - Signed by Crawford Morna Pickle, NP on 06/15/2020 Stage prefix: Initial diagnosis Histologic grading system: 3 grade system   SUMMARY OF ONCOLOGIC HISTORY: Oncology History  Malignant neoplasm of lower-outer quadrant of right breast of female, estrogen receptor positive (HCC)  05/03/2020 Initial Diagnosis   Screening mammogram showed a 1.0cm mass at the 6 o'clock position in the right breast. Diagnostic mammogram and US  showed the 1.2cm mass at the 6 o'clock position in the right breast. Biopsy showed IDC, grade 3, HER-2 positive (3+), ER 15% weak, PR-, Ki67 80%.   05/19/2020 Genetic Testing   Negative hereditary cancer genetic testing: no pathogenic variants detected in [facility] Breast STAT Panel and Common Hereditary Cancers Panel.  The report dates are May 19, 2020 (STAT) and May 23, 2020 (Common Hereditary).   The Common Hereditary Cancers Panel offered by [facility] includes sequencing and/or deletion duplication testing of the following 47 genes: APC, ATM, AXIN2, BARD1, BMPR1A, BRCA1, BRCA2, BRIP1, CDH1, CDK4, CDKN2A (p14ARF), CDKN2A (p16INK4a), CHEK2, CTNNA1, DICER1, EPCAM (Deletion/duplication testing only), GREM1 (promoter region deletion/duplication testing only), GREM1, HOXB13, KIT, MEN1, MLH1, MSH2, MSH3, MSH6, MUTYH, NBN, NF1, NHTL1, PALB2, PDGFRA, PMS2, POLD1, POLE, PTEN, RAD50, RAD51C, RAD51D, SDHA, SDHB, SDHC, SDHD, SMAD4, SMARCA4. STK11, TP53, TSC1, TSC2, and  VHL.  The following genes were evaluated for sequence changes only: SDHA and HOXB13 c.251G>A variant only.es only: SDHA and HOXB13 c.251G>A variant only.   06/02/2020 Surgery   Right lumpectomy ([facility]): invasive and in situ ductal carcinoma, 1.3cm, clear margins, 1 right axillary lymph node negative for carcinoma.   06/02/2020 Cancer Staging   Staging form: Breast, AJCC 8th Edition - Pathologic stage from 06/02/2020: Stage IA (pT1c, pN0, cM0, G3, ER+, PR-, HER2+) - Signed by Crawford Morna Pickle, NP on 06/15/2020 Stage prefix: Initial diagnosis Histologic grading system: 3 grade system   07/08/2020 - 10/21/2020 Chemotherapy   Taxol  Herceptin  followed by Herceptin  maintenance   04/14/2021 -  Anti-estrogen oral therapy   Tamoxifen  10 mg   07/13/2021 Imaging   CT angiogram: Right supraclavicular lymph node 5 cm, right internal mammary lymph nodes 1.8 cm    08/06/2021 PET scan   PET CT scan: Ext Right Supra clav LN along with Internal mammary and Rt Upper Mediastinal and Rt Axillary LN   08/2021 Initial Biopsy   Right supraclavicular lymphadenopathy: Biopsy: Metastatic poorly differentiated adenocarcinoma, ER +40%, PR negative, HER2 positive 3+    08/21/2021 -  Chemotherapy   Enhertu  every 3 weeks    03/08/2022 Imaging   CT chest/abdomen/pelvis  1. No new or progressive findings in the chest, abdomen or pelvis. 2. Signs of RIGHT breast lumpectomy with areas of central fat necrosis unchanged from previous imaging. 3. Stable small lymph nodes throughout the chest, largest with area of soft tissue about surgical clips in the RIGHT axilla. 4. Stable appearance of thoracic inlet lymph nodes, not shown to be hypermetabolic on prior PET imaging. 5. IUD in-situ. 6. Aortic atherosclerosis.   08/21/2022 Imaging   Echocardiogram  -  normal LVEF at 60-65%. -no left ventricular wall motion abnormalities  -normal left ventricular diastolic function  -mild left ventricular hypertrophy.    10/05/2022  Imaging   CT Chest, Abdomen, and Pelvis with contrast IMPRESSION: 1. Stable examination without new or progressive finding in the chest, abdomen or pelvis to suggest recurrent or metastatic disease. 2. Stable tiny bilateral thoracic inlet lymph nodes. 3.  Aortic Atherosclerosis (ICD10-I70.0).     CURRENT THERAPY:  INTERVAL HISTORY:  Discussed the use of AI scribe software for clinical note transcription with the patient, who gave verbal consent to proceed.  Pamela [name] 58 y.o. female with a history of metastatic breast cancer, presents for follow-up. She reports a recent episode of illness around the Christmas holiday, characterized by lightheadedness, dizziness, chills, fever, cough, and severe nausea. The patient describes the cough as dry and painful, with mucus production improving recently. The highest recorded fever was 101-102 degrees Fahrenheit, last noted on December 29th. The patient also mentions a missed appointment due to weakness and illness.  She notes she was around her parents who had similar symptoms at the time and were positive for the flu.  She is feeling better today than prior.    The patient was also diagnosed with pneumonia in early December for which she completed a round of antibiotics and improved when she subsequently developed symptoms of what seems like the flu.  Her cough is continue to be an issue.  It is improving but still bothers her particularly at night.  She wants to know if there is any additional medication she can take for this.    Patient Active Problem List   Diagnosis Date Noted   Drug-induced constipation 11/24/2022   Chemotherapy induced cardiomyopathy (HCC) 05/08/2022   Aortic atherosclerosis (HCC) 04/26/2022   Acute cough 01/15/2022   Hypomagnesemia 11/26/2021   Hypokalemia 11/26/2021   Dizziness, fatigue, globus sensation 11/26/2021   Breast cancer (HCC) 08/25/2021   Nausea without vomiting 08/25/2021   Prediabetes 03/13/2021    Non-intractable vomiting 07/13/2020   Port-A-Cath in place 07/08/2020   Genetic testing 05/20/2020   Family history of breast cancer 05/12/2020   Family history of lung cancer 05/12/2020   Malignant neoplasm of lower-outer quadrant of right breast of female, estrogen receptor positive (HCC) 05/03/2020   Elevated troponin level not due myocardial infarction 09/18/2019   Mixed hyperlipidemia 09/18/2019   Hypertensive emergency 09/17/2019   Primary osteoarthritis of right hip 08/06/2018   Seasonal allergies 06/12/2018   Fibrocystic disease of breast 05/27/2018   Obstructive sleep apnea syndrome, moderate 10/27/2013   Shift work sleep disorder 10/27/2013   Psychic factors associated with diseases classified elsewhere 08/07/2013   Pure hypercholesterolemia 07/21/2013   Essential hypertension 07/21/2013   Snoring 07/21/2013   IUD contraception-inserted 10/01/11 10/01/2011   Class 3 severe obesity due to excess calories with serious comorbidity and body mass index (BMI) of 40.0 to 44.9 in adult (HCC) 09/03/2011    is allergic to pollen extract, compazine  [prochlorperazine ], latex, codeine, diflucan  [fluconazole ], shellfish allergy, and betadine [povidone iodine].  MEDICAL HISTORY: Past Medical History:  Diagnosis Date   Anxiety    Asthma    Back pain    Breast cancer (HCC)    Constipation    DUB (dysfunctional uterine bleeding) 2007   Edema of both lower extremities    Elevated cholesterol    Family history of breast cancer 05/12/2020   Family history of lung cancer 05/12/2020   Food allergy    H/O menorrhagia  05/01/2006   High cholesterol    History of ovarian cyst 2007   Hypertension 03/31/2005   Increased BMI 07/11/2006   Joint pain    Migraine    N&V (nausea and vomiting)    Obesity 2007   Obstructive sleep apnea syndrome, moderate 10/27/2013   no cpap   Personal history of chemotherapy    Personal history of radiation therapy    Sleep apnea    SOB (shortness of  breath)    Vitamin D  deficiency    Vitamin D  deficiency    Weight loss 07/11/2006    SURGICAL HISTORY: Past Surgical History:  Procedure Laterality Date   BREAST LUMPECTOMY     BREAST LUMPECTOMY WITH RADIOACTIVE SEED AND SENTINEL LYMPH NODE BIOPSY Right 06/02/2020   Procedure: RIGHT BREAST LUMPECTOMY WITH RADIOACTIVE SEED AND RIGHT SENTINEL LYMPH NODE BIOPSY;  Surgeon: Vanderbilt Ned, MD;  Location: [facility];  Service: General;  Laterality: Right;   BREAST REDUCTION SURGERY  05/04/1991   CESAREAN SECTION     HYSTEROSCOPY  05/01/2006   PORTACATH PLACEMENT Right 06/02/2020   Procedure: INSERTION PORT-A-CATH;  Surgeon: Vanderbilt Ned, MD;  Location:  SURGERY CENTER;  Service: General;  Laterality: Right;   REDUCTION MAMMAPLASTY      SOCIAL HISTORY: Social History   Socioeconomic History   Marital status: Single    Spouse name: Not on file   Number of children: Not on file   Years of education: Not on file   Highest education level: Not on file  Occupational History   Occupation: Child Psychotherapist  Tobacco Use   Smoking status: Never   Smokeless tobacco: Never  Vaping Use   Vaping status: Never Used  Substance and Sexual Activity   Alcohol use: No   Drug use: Not Currently    Frequency: 2.0 times per week   Sexual activity: Not Currently    Birth control/protection: I.U.D.    Comment: Mirena   Other Topics Concern   Not on file  Social History Narrative   Not on file   Social Drivers of Health   Financial Resource Strain: High Risk (03/14/2022)   Overall Financial Resource Strain (CARDIA)    Difficulty of Paying Living Expenses: Hard  Food Insecurity: Not on file  Transportation Needs: Not on file  Physical Activity: Not on file  Stress: Not on file  Social Connections: Not on file  Intimate Partner Violence: Not on file    FAMILY HISTORY: Family History  Problem Relation Age of Onset   Hypertension Mother    High Cholesterol Mother     Thyroid  disease Mother    Cancer Mother    Sleep apnea Mother    Hypertension Father    Heart attack Father    Sudden death Father    Diabetes Maternal Grandmother    Bone cancer Maternal Grandfather        dx after 60   Breast cancer Maternal Aunt        dx 30s   Lung cancer Maternal Aunt        dx after 50    Review of Systems  Constitutional:  Positive for fatigue. Negative for appetite change, chills, fever and unexpected weight change.  HENT:   Negative for hearing loss, lump/mass and trouble swallowing.   Eyes:  Negative for eye problems and icterus.  Respiratory:  Positive for cough and wheezing. Negative for chest tightness and shortness of breath.   Cardiovascular:  Negative for chest pain, leg swelling  and palpitations.  Gastrointestinal:  Negative for abdominal distention, abdominal pain, constipation, diarrhea, nausea and vomiting.  Endocrine: Negative for hot flashes.  Genitourinary:  Negative for difficulty urinating.   Musculoskeletal:  Negative for arthralgias.  Skin:  Negative for itching and rash.  Neurological:  Negative for dizziness, extremity weakness, headaches and numbness.  Hematological:  Negative for adenopathy. Does not bruise/bleed easily.  Psychiatric/Behavioral:  Negative for depression. The patient is not nervous/anxious.       PHYSICAL EXAMINATION    Vitals:   03/07/23 1026  BP: (!) 156/93  Pulse: 88  Resp: 19  Temp: 98.4 F (36.9 C)  SpO2: 100%    Physical Exam Constitutional:      General: She is not in acute distress.    Appearance: Normal appearance. She is not toxic-appearing.  HENT:     Head: Normocephalic and atraumatic.     Mouth/Throat:     Mouth: Mucous membranes are moist.     Pharynx: Oropharynx is clear. No oropharyngeal exudate or posterior oropharyngeal erythema.  Eyes:     General: No scleral icterus. Cardiovascular:     Rate and Rhythm: Normal rate and regular rhythm.     Pulses: Normal pulses.     Heart  sounds: Normal heart sounds.  Pulmonary:     Effort: Pulmonary effort is normal.     Breath sounds: Normal breath sounds.  Abdominal:     General: Abdomen is flat. Bowel sounds are normal. There is no distension.     Palpations: Abdomen is soft.     Tenderness: There is no abdominal tenderness.  Musculoskeletal:        General: No swelling.     Cervical back: Neck supple.  Lymphadenopathy:     Cervical: No cervical adenopathy.  Skin:    General: Skin is warm and dry.     Findings: No rash.  Neurological:     General: No focal deficit present.     Mental Status: She is alert.  Psychiatric:        Mood and Affect: Mood normal.        Behavior: Behavior normal.     LABORATORY DATA:  CBC    Component Value Date/Time   WBC 3.6 (L) 03/07/2023 0947   WBC 5.3 11/28/2021 0901   RBC 4.58 03/07/2023 0947   HGB 12.7 03/07/2023 0947   HGB 13.2 09/22/2019 1040   HCT 39.0 03/07/2023 0947   HCT 41.3 09/22/2019 1040   PLT 152 03/07/2023 0947   PLT 247 09/22/2019 1040   MCV 85.2 03/07/2023 0947   MCV 83 09/22/2019 1040   MCH 27.7 03/07/2023 0947   MCHC 32.6 03/07/2023 0947   RDW 16.8 (H) 03/07/2023 0947   RDW 14.7 09/22/2019 1040   LYMPHSABS 1.4 03/07/2023 0947   MONOABS 0.5 03/07/2023 0947   EOSABS 0.3 03/07/2023 0947   BASOSABS 0.0 03/07/2023 0947    CMP     Component Value Date/Time   NA 141 03/07/2023 0947   NA 142 09/22/2019 1040   K 3.2 (L) 03/07/2023 0947   CL 105 03/07/2023 0947   CO2 31 03/07/2023 0947   GLUCOSE 125 (H) 03/07/2023 0947   BUN 5 (L) 03/07/2023 0947   BUN 11 09/22/2019 1040   CREATININE 0.68 03/07/2023 0947   CALCIUM  9.0 03/07/2023 0947   PROT 6.7 03/07/2023 0947   PROT 6.7 06/17/2019 1054   ALBUMIN 3.4 (L) 03/07/2023 0947   ALBUMIN 4.5 06/17/2019 1054   AST 61 (H)  03/07/2023 0947   ALT 33 03/07/2023 0947   ALKPHOS 89 03/07/2023 0947   BILITOT 1.2 03/07/2023 0947   GFRNONAA >60 03/07/2023 0947   GFRAA 77 09/22/2019 1040       ASSESSMENT and THERAPY PLAN:   Malignant neoplasm of lower-outer quadrant of right breast of female, estrogen receptor positive (HCC) 06/02/2020:Right lumpectomy ([facility]): invasive and in situ ductal carcinoma, 1.3cm, clear margins, 1 right axillary lymph node negative for carcinoma. ER 15% weak, PR-,HER-2 positive (3+) Ki67 80%.   Treatment plan: 1.  Adjuvant chemotherapy with Taxol  Herceptin  followed by Herceptin  maintenance.  Stopped after 11 cycles of Taxol  Herceptin  maintenance completed 07/07/2021  2.  Adjuvant radiation therapy 11/23/2020-12/19/2020 3.  Adjuvant antiestrogen therapy with tamoxifen  started October 2022 4.  CT angiogram 07/13/2021: Right supraclavicular lymph node 5 cm, right internal mammary lymph nodes 1.8 cm 5.  Metastatic breast cancer: June 2023:Right supraclavicular lymphadenopathy: Biopsy: Metastatic poorly differentiated adenocarcinoma, ER +40%, PR negative, HER2 positive 3+ PET CT scan 08/06/21: Ext Right Supra clav LN along with Internal mammary and Rt Upper Mediastinal and Rt Axillary LN    CT chest abdomen pelvis 06/25/2022: Stable exam.  No new or progressive findings. CT CAP 10/04/2022: Stable findings without new or progressive disease CT CAP 02/22/2023: Stable findings without new or progressive disease.  Pneumonia that was noted on chest x-ray from earlier in the month was again noted. -----------------------------------------------------------------------------------------------------  Current treatment: Enhertu  started on 08/21/2021  Metastatic Breast Cancer Stable disease on recent imaging. No new symptoms related to cancer.  No signs of breast cancer progression. -Continue current treatment regimen. -IV fluids today with potassium -Hold therapy treatment today and proceed with next week as she is continuing to clinically improve from having the flu  Recent Pneumonia Resolved with antibiotics. No current symptoms of infection. -Repeat imaging in 3  months with CT chest to ensure resolution.  Recent Flu-like Illness Symptoms of fever, chills, cough, and nausea. Last fever on 03/03/2023. Symptoms improving. -Continue supportive care. -Call if fever recurs for consideration of different antibiotic.  Persistent Cough Likely post-infectious or related to post-nasal drip. -Prescribe albuterol  inhaler and cough syrup for patient to use. -Prescribe Tessalon  Perles. -Consider use of Mucinex  for expectoration.  We will see her back in 1 week for labs, follow-up, and her next treatment.    All questions were answered. The patient knows to call the clinic with any problems, questions or concerns. We can certainly see the patient much sooner if necessary.  Total encounter time:40 minutes*in face-to-face visit time, chart review, lab review, care coordination, order entry, and documentation of the encounter time.  Morna Kendall, NP 03/07/23 3:25 PM Medical Oncology and Hematology [facility] [address] [place], [place] [address] Tel. [phone]    Fax. [phone]  *Total Encounter Time as defined by the Centers for Medicare and Medicaid Services includes, in addition to the face-to-face time of a patient visit (documented in the note above) non-face-to-face time: obtaining and reviewing outside history, ordering and reviewing medications, tests or procedures, care coordination (communications with other health care professionals or caregivers) and documentation in the medical record.

## 2023-03-13 ENCOUNTER — Ambulatory Visit: Payer: 59

## 2023-03-13 ENCOUNTER — Encounter: Payer: Self-pay | Admitting: Licensed Clinical Social Worker

## 2023-03-13 NOTE — Progress Notes (Signed)
 [facility] CSW Progress Note  Clinical Child Psychotherapist  received e-mail from patient  to request any financial assistance due to being out sick from work recently. Patient has already received most available financial assistance [employer], [employer], [employer], [employer] fund, Diplomatic Services Operational Officer, [employer]).  Reminded patient of [employer] application as other potential avenue for assistance and provided link to online application. No other grants are currently open that this CSW is aware of.    Cordie Buening E Oree Mirelez, LCSW Clinical Social Worker [employer]

## 2023-03-14 ENCOUNTER — Telehealth: Payer: Self-pay | Admitting: *Deleted

## 2023-03-14 NOTE — Telephone Encounter (Signed)
 [employer] Critical Illness Claim completed to provider bin for collaborative pick up for provider review, signature and return to tis nurse.

## 2023-03-15 ENCOUNTER — Ambulatory Visit: Payer: 59

## 2023-03-15 ENCOUNTER — Other Ambulatory Visit: Payer: 59

## 2023-03-19 ENCOUNTER — Inpatient Hospital Stay: Payer: 59

## 2023-03-19 ENCOUNTER — Inpatient Hospital Stay (HOSPITAL_BASED_OUTPATIENT_CLINIC_OR_DEPARTMENT_OTHER): Payer: 59 | Admitting: Adult Health

## 2023-03-19 VITALS — BP 181/79 | HR 71 | Temp 97.8°F | Resp 19

## 2023-03-19 DIAGNOSIS — C50511 Malignant neoplasm of lower-outer quadrant of right female breast: Secondary | ICD-10-CM

## 2023-03-19 DIAGNOSIS — Z95828 Presence of other vascular implants and grafts: Secondary | ICD-10-CM

## 2023-03-19 DIAGNOSIS — Z5112 Encounter for antineoplastic immunotherapy: Secondary | ICD-10-CM | POA: Diagnosis not present

## 2023-03-19 DIAGNOSIS — Z170 Estrogen receptor positive status [ER+]: Secondary | ICD-10-CM

## 2023-03-19 DIAGNOSIS — C50919 Malignant neoplasm of unspecified site of unspecified female breast: Secondary | ICD-10-CM

## 2023-03-19 DIAGNOSIS — R110 Nausea: Secondary | ICD-10-CM

## 2023-03-19 LAB — CBC WITH DIFFERENTIAL (CANCER CENTER ONLY)
Abs Immature Granulocytes: 0.01 10*3/uL (ref 0.00–0.07)
Basophils Absolute: 0 10*3/uL (ref 0.0–0.1)
Basophils Relative: 1 %
Eosinophils Absolute: 0.3 10*3/uL (ref 0.0–0.5)
Eosinophils Relative: 8 %
HCT: 34.3 % — ABNORMAL LOW (ref 36.0–46.0)
Hemoglobin: 11.3 g/dL — ABNORMAL LOW (ref 12.0–15.0)
Immature Granulocytes: 0 %
Lymphocytes Relative: 24 %
Lymphs Abs: 0.9 10*3/uL (ref 0.7–4.0)
MCH: 27.5 pg (ref 26.0–34.0)
MCHC: 32.9 g/dL (ref 30.0–36.0)
MCV: 83.5 fL (ref 80.0–100.0)
Monocytes Absolute: 0.5 10*3/uL (ref 0.1–1.0)
Monocytes Relative: 14 %
Neutro Abs: 2 10*3/uL (ref 1.7–7.7)
Neutrophils Relative %: 53 %
Platelet Count: 133 10*3/uL — ABNORMAL LOW (ref 150–400)
RBC: 4.11 MIL/uL (ref 3.87–5.11)
RDW: 16.4 % — ABNORMAL HIGH (ref 11.5–15.5)
WBC Count: 3.7 10*3/uL — ABNORMAL LOW (ref 4.0–10.5)
nRBC: 0 % (ref 0.0–0.2)

## 2023-03-19 LAB — CMP (CANCER CENTER ONLY)
ALT: 30 U/L (ref 0–44)
AST: 46 U/L — ABNORMAL HIGH (ref 15–41)
Albumin: 3.3 g/dL — ABNORMAL LOW (ref 3.5–5.0)
Alkaline Phosphatase: 97 U/L (ref 38–126)
Anion gap: 4 — ABNORMAL LOW (ref 5–15)
BUN: 6 mg/dL (ref 6–20)
CO2: 29 mmol/L (ref 22–32)
Calcium: 8.8 mg/dL — ABNORMAL LOW (ref 8.9–10.3)
Chloride: 110 mmol/L (ref 98–111)
Creatinine: 0.66 mg/dL (ref 0.44–1.00)
GFR, Estimated: >60 mL/min (ref >60)
Glucose, Bld: 108 mg/dL — ABNORMAL HIGH (ref 70–99)
Potassium: 3 mmol/L — ABNORMAL LOW (ref 3.5–5.1)
Sodium: 143 mmol/L (ref 135–145)
Total Bilirubin: 1.6 mg/dL — ABNORMAL HIGH (ref 0.0–1.2)
Total Protein: 6.2 g/dL — ABNORMAL LOW (ref 6.5–8.1)

## 2023-03-19 MED ORDER — ACETAMINOPHEN 325 MG PO TABS
650.0000 mg | ORAL_TABLET | Freq: Once | ORAL | Status: AC
  Administered 2023-03-19: 650 mg via ORAL
  Filled 2023-03-19: qty 2

## 2023-03-19 MED ORDER — SODIUM CHLORIDE 0.9% FLUSH
10.0000 mL | INTRAVENOUS | Status: DC | PRN
  Administered 2023-03-19 (×2): 10 mL

## 2023-03-19 MED ORDER — PALONOSETRON HCL INJECTION 0.25 MG/5ML
0.2500 mg | Freq: Once | INTRAVENOUS | Status: AC
  Administered 2023-03-19: 0.25 mg via INTRAVENOUS
  Filled 2023-03-19: qty 5

## 2023-03-19 MED ORDER — SODIUM CHLORIDE 0.9 % IV SOLN: Freq: Once | INTRAVENOUS | Status: DC

## 2023-03-19 MED ORDER — FAM-TRASTUZUMAB DERUXTECAN-NXKI CHEMO 100 MG IV SOLR
300.0000 mg | Freq: Once | INTRAVENOUS | Status: AC
  Administered 2023-03-19: 300 mg via INTRAVENOUS
  Filled 2023-03-19: qty 15

## 2023-03-19 MED ORDER — FOSAPREPITANT DIMEGLUMINE INJECTION 150 MG
150.0000 mg | Freq: Once | INTRAVENOUS | Status: AC
  Administered 2023-03-19: 150 mg via INTRAVENOUS
  Filled 2023-03-19: qty 150

## 2023-03-19 MED ORDER — DEXTROSE 5 % IV SOLN: Freq: Once | INTRAVENOUS | Status: AC

## 2023-03-19 MED ORDER — HEPARIN SOD (PORK) LOCK FLUSH 100 UNIT/ML IV SOLN
500.0000 [IU] | Freq: Once | INTRAVENOUS | Status: AC | PRN
  Administered 2023-03-19: 500 [IU]

## 2023-03-19 MED ORDER — POTASSIUM CHLORIDE 10 MEQ/100ML IV SOLN
10.0000 meq | Freq: Once | INTRAVENOUS | Status: AC
  Administered 2023-03-19: 10 meq via INTRAVENOUS
  Filled 2023-03-19: qty 100

## 2023-03-19 NOTE — Progress Notes (Signed)
 Ok to tx with T bili 1.6 and SBP 181 and pt will f/u with cards per NP.

## 2023-03-19 NOTE — Patient Instructions (Signed)
 [facility] - A DEPT OF [facility]  Discharge Instructions: Thank you for choosing [facility] to provide your oncology and hematology care.   If you have a lab appointment with the Cancer Center, please go directly to the Cancer Center and check in at the registration area.   Wear comfortable clothing and clothing appropriate for easy access to any Portacath or PICC line.   We strive to give you quality time with your provider. You may need to reschedule your appointment if you arrive late (15 or more minutes).  Arriving late affects you and other patients whose appointments are after yours.  Also, if you miss three or more appointments without notifying the office, you may be dismissed from the clinic at the provider's discretion.      For prescription refill requests, have your pharmacy contact our office and allow 72 hours for refills to be completed.    Today you received the following chemotherapy and/or immunotherapy agents Enhertu      To help prevent nausea and vomiting after your treatment, we encourage you to take your nausea medication as directed.  BELOW ARE SYMPTOMS THAT SHOULD BE REPORTED IMMEDIATELY: *FEVER GREATER THAN 100.4 F (38 C) OR HIGHER *CHILLS OR SWEATING *NAUSEA AND VOMITING THAT IS NOT CONTROLLED WITH YOUR NAUSEA MEDICATION *UNUSUAL SHORTNESS OF BREATH *UNUSUAL BRUISING OR BLEEDING *URINARY PROBLEMS (pain or burning when urinating, or frequent urination) *BOWEL PROBLEMS (unusual diarrhea, constipation, pain near the anus) TENDERNESS IN MOUTH AND THROAT WITH OR WITHOUT PRESENCE OF ULCERS (sore throat, sores in mouth, or a toothache) UNUSUAL RASH, SWELLING OR PAIN  UNUSUAL VAGINAL DISCHARGE OR ITCHING   Items with * indicate a potential emergency and should be followed up as soon as possible or go to the Emergency Department if any problems should occur.  Please show the CHEMOTHERAPY ALERT CARD or IMMUNOTHERAPY  ALERT CARD at check-in to the Emergency Department and triage nurse.  Should you have questions after your visit or need to cancel or reschedule your appointment, please contact [facility] - A DEPT OF [facility]  Dept: [phone]  and follow the prompts.  Office hours are 8:00 a.m. to 4:30 p.m. Monday - Friday. Please note that voicemails left after 4:00 p.m. may not be returned until the following business day.  We are closed weekends and major holidays. You have access to a nurse at all times for urgent questions. Please call the main number to the clinic Dept: [phone] and follow the prompts.   For any non-urgent questions, you may also contact your provider using MyChart. We now offer e-Visits for anyone 19 and older to request care online for non-urgent symptoms. For details visit [email or web address].   Also download the MyChart app! Go to the app store, search "MyChart", open the app, select [facility], and log in with your MyChart username and password.

## 2023-03-20 NOTE — Telephone Encounter (Addendum)
 03/18/2023 Late entry Received critical Illness form signed by provider.  Successfully returned to [facility] via fax [phone]) with pathology reports, [id number], [phone] and [phone] as required per form.  Copy to bin designated for items to be scanned.  Form mailed to Melvenia Stabs address on file. [address] [place] [address] Copy to [facility] bin for items to be scanned completes process.  No further instructions received, actions performed by this nurse. Connected with patient to provide status of forms and confirmed address.

## 2023-03-21 ENCOUNTER — Other Ambulatory Visit: Payer: Self-pay

## 2023-03-21 ENCOUNTER — Encounter: Payer: Self-pay | Admitting: Hematology and Oncology

## 2023-03-21 NOTE — Assessment & Plan Note (Signed)
06/02/2020:Right lumpectomy ([facility]): invasive and in situ ductal carcinoma, 1.3cm, clear margins, 1 right axillary lymph node negative for carcinoma. ER 15% weak, PR-,HER-2 positive (3+) Ki67 80%.   Treatment plan: 1.  Adjuvant chemotherapy with Taxol Herceptin followed by Herceptin maintenance.  Stopped after 11 cycles of Taxol Herceptin maintenance completed 07/07/2021  2.  Adjuvant radiation therapy 11/23/2020-12/19/2020 3.  Adjuvant antiestrogen therapy with tamoxifen started October 2022 4.  CT angiogram 07/13/2021: Right supraclavicular lymph node 5 cm, right internal mammary lymph nodes 1.8 cm 5.  Metastatic breast cancer: June 2023:Right supraclavicular lymphadenopathy: Biopsy: Metastatic poorly differentiated adenocarcinoma, ER +40%, PR negative, HER2 positive 3+ PET CT scan 08/06/21: Ext Right Supra clav LN along with Internal mammary and Rt Upper Mediastinal and Rt Axillary LN    CT chest abdomen pelvis 06/25/2022: Stable exam.  No new or progressive findings. CT CAP 10/04/2022: Stable findings without new or progressive disease CT CAP 02/22/2023: Stable findings without new or progressive disease.  Pneumonia that was noted on chest x-ray from earlier in the month was again noted. -----------------------------------------------------------------------------------------------------  Current treatment: Enhertu started on 08/21/2021  Metastatic Breast Cancer Stable disease on recent imaging. No new symptoms related to cancer.  No signs of breast cancer progression. -Continue treatment with Enhertu -Echo overdue, scheduled in early February with Dr. Gala Romney  Hypertension BP elevated today, likely in setting of being off medication during previous viral illness.   -I recommended she check her BP at home -Encouraged compliance with anti-hypertensives -If remains elevated or headache continues with elevation, recommended she reach out to Dr. Prescott Gum office  RTC in 4 weeks for labs, f/u,  and next treatment.

## 2023-03-21 NOTE — Progress Notes (Signed)
[facility] Cancer Follow up:    Pamela Peng, MD [address] [facility] [address] [place] [address]   DIAGNOSIS:  Cancer Staging  Malignant neoplasm of lower-outer quadrant of right breast of female, estrogen receptor positive (HCC) Staging form: Breast, AJCC 8th Edition - Clinical stage from 05/11/2020: Stage IA (cT1b, cN0, cM0, G3, ER+, PR-, HER2+) - Signed by Serena Croissant, MD on 05/11/2020 Stage prefix: Initial diagnosis - Pathologic stage from 06/02/2020: Stage IA (pT1c, pN0, cM0, G3, ER+, PR-, HER2+) - Signed by Loa Socks, NP on 06/15/2020 Stage prefix: Initial diagnosis Histologic grading system: 3 grade system   SUMMARY OF ONCOLOGIC HISTORY: Oncology History  Malignant neoplasm of lower-outer quadrant of right breast of female, estrogen receptor positive (HCC)  05/03/2020 Initial Diagnosis   Screening mammogram showed a 1.0cm mass at the 6 o'clock position in the right breast. Diagnostic mammogram and US showed the 1.2cm mass at the 6 o'clock position in the right breast. Biopsy showed IDC, grade 3, HER-2 positive (3+), ER 15% weak, PR-, Ki67 80%.   05/19/2020 Genetic Testing   Negative hereditary cancer genetic testing: no pathogenic variants detected in [facility] Breast STAT Panel and Common Hereditary Cancers Panel.  The report dates are May 19, 2020 (STAT) and May 23, 2020 (Common Hereditary).   The Common Hereditary Cancers Panel offered by [facility] includes sequencing and/or deletion duplication testing of the following 47 genes: APC, ATM, AXIN2, BARD1, BMPR1A, BRCA1, BRCA2, BRIP1, CDH1, CDK4, CDKN2A (p14ARF), CDKN2A (p16INK4a), CHEK2, CTNNA1, DICER1, EPCAM (Deletion/duplication testing only), GREM1 (promoter region deletion/duplication testing only), GREM1, HOXB13, KIT, MEN1, MLH1, MSH2, MSH3, MSH6, MUTYH, NBN, NF1, NHTL1, PALB2, PDGFRA, PMS2, POLD1, POLE, PTEN, RAD50, RAD51C, RAD51D, SDHA, SDHB, SDHC, SDHD, SMAD4, SMARCA4. STK11, TP53, TSC1, TSC2, and  VHL.  The following genes were evaluated for sequence changes only: SDHA and HOXB13 c.251G>A variant only.es only: SDHA and HOXB13 c.251G>A variant only.   06/02/2020 Surgery   Right lumpectomy ([facility]): invasive and in situ ductal carcinoma, 1.3cm, clear margins, 1 right axillary lymph node negative for carcinoma.   06/02/2020 Cancer Staging   Staging form: Breast, AJCC 8th Edition - Pathologic stage from 06/02/2020: Stage IA (pT1c, pN0, cM0, G3, ER+, PR-, HER2+) - Signed by Loa Socks, NP on 06/15/2020 Stage prefix: Initial diagnosis Histologic grading system: 3 grade system   07/08/2020 - 10/21/2020 Chemotherapy   Taxol Herceptin followed by Herceptin maintenance   04/14/2021 -  Anti-estrogen oral therapy   Tamoxifen 10 mg   07/13/2021 Imaging   CT angiogram: Right supraclavicular lymph node 5 cm, right internal mammary lymph nodes 1.8 cm    08/06/2021 PET scan   PET CT scan: Ext Right Supra clav LN along with Internal mammary and Rt Upper Mediastinal and Rt Axillary LN   08/2021 Initial Biopsy   Right supraclavicular lymphadenopathy: Biopsy: Metastatic poorly differentiated adenocarcinoma, ER +40%, PR negative, HER2 positive 3+    08/21/2021 -  Chemotherapy   Enhertu every 3 weeks    03/08/2022 Imaging   CT chest/abdomen/pelvis  1. No new or progressive findings in the chest, abdomen or pelvis. 2. Signs of RIGHT breast lumpectomy with areas of central fat necrosis unchanged from previous imaging. 3. Stable small lymph nodes throughout the chest, largest with area of soft tissue about surgical clips in the RIGHT axilla. 4. Stable appearance of thoracic inlet lymph nodes, not shown to be hypermetabolic on prior PET imaging. 5. IUD in-situ. 6. Aortic atherosclerosis.   08/21/2022 Imaging   Echocardiogram  -  normal LVEF at 60-65%. -no left ventricular wall motion abnormalities  -normal left ventricular diastolic function  -mild left ventricular hypertrophy.    10/05/2022  Imaging   CT Chest, Abdomen, and Pelvis with contrast IMPRESSION: 1. Stable examination without new or progressive finding in the chest, abdomen or pelvis to suggest recurrent or metastatic disease. 2. Stable tiny bilateral thoracic inlet lymph nodes. 3.  Aortic Atherosclerosis (ICD10-I70.0).     CURRENT THERAPY: Enhertu  INTERVAL HISTORY: Pamela [name] 58 y.o. female returns for follow-up and evaluation prior to receiving Enhertu.  She has not received this since around Thanksgiving due to the fact that she had COVID a few weeks ago and was tired and feeling under the weather.  She tells me that she is feeling better today.  She does have elevated blood pressure and intermittent headaches.  She was not taking her anti-hypertensive medication due to her viral iullness..  She has been taking them regularly for the past 1-2 weeks.       Patient Active Problem List   Diagnosis Date Noted   Drug-induced constipation 11/24/2022   Chemotherapy induced cardiomyopathy (HCC) 05/08/2022   Aortic atherosclerosis (HCC) 04/26/2022   Acute cough 01/15/2022   Hypomagnesemia 11/26/2021   Hypokalemia 11/26/2021   Dizziness, fatigue, globus sensation 11/26/2021   Breast cancer (HCC) 08/25/2021   Nausea without vomiting 08/25/2021   Prediabetes 03/13/2021   Non-intractable vomiting 07/13/2020   Port-A-Cath in place 07/08/2020   Genetic testing 05/20/2020   Family history of breast cancer 05/12/2020   Family history of lung cancer 05/12/2020   Malignant neoplasm of lower-outer quadrant of right breast of female, estrogen receptor positive (HCC) 05/03/2020   Elevated troponin level not due myocardial infarction 09/18/2019   Mixed hyperlipidemia 09/18/2019   Hypertensive emergency 09/17/2019   Primary osteoarthritis of right hip 08/06/2018   Seasonal allergies 06/12/2018   Fibrocystic disease of breast 05/27/2018   Obstructive sleep apnea syndrome, moderate 10/27/2013   Shift work sleep  disorder 10/27/2013   Psychic factors associated with diseases classified elsewhere 08/07/2013   Pure hypercholesterolemia 07/21/2013   Essential hypertension 07/21/2013   Snoring 07/21/2013   IUD contraception-inserted 10/01/11 10/01/2011   Class 3 severe obesity due to excess calories with serious comorbidity and body mass index (BMI) of 40.0 to 44.9 in adult (HCC) 09/03/2011    is allergic to pollen extract, compazine [prochlorperazine], latex, codeine, diflucan [fluconazole], shellfish allergy, and betadine [povidone iodine].  MEDICAL HISTORY: Past Medical History:  Diagnosis Date   Anxiety    Asthma    Back pain    Breast cancer (HCC)    Constipation    DUB (dysfunctional uterine bleeding) 2007   Edema of both lower extremities    Elevated cholesterol    Family history of breast cancer 05/12/2020   Family history of lung cancer 05/12/2020   Food allergy    H/O menorrhagia 05/01/2006   High cholesterol    History of ovarian cyst 2007   Hypertension 03/31/2005   Increased BMI 07/11/2006   Joint pain    Migraine    N&V (nausea and vomiting)    Obesity 2007   Obstructive sleep apnea syndrome, moderate 10/27/2013   no cpap   Personal history of chemotherapy    Personal history of radiation therapy    Sleep apnea    SOB (shortness of breath)    Vitamin D deficiency    Vitamin D deficiency    Weight loss 07/11/2006    SURGICAL HISTORY: Past  Surgical History:  Procedure Laterality Date   BREAST LUMPECTOMY     BREAST LUMPECTOMY WITH RADIOACTIVE SEED AND SENTINEL LYMPH NODE BIOPSY Right 06/02/2020   Procedure: RIGHT BREAST LUMPECTOMY WITH RADIOACTIVE SEED AND RIGHT SENTINEL LYMPH NODE BIOPSY;  Surgeon: Harriette Bouillon, MD;  Location: [facility];  Service: General;  Laterality: Right;   BREAST REDUCTION SURGERY  05/04/1991   CESAREAN SECTION     HYSTEROSCOPY  05/01/2006   PORTACATH PLACEMENT Right 06/02/2020   Procedure: INSERTION PORT-A-CATH;  Surgeon:  Harriette Bouillon, MD;  Location: [facility];  Service: General;  Laterality: Right;   REDUCTION MAMMAPLASTY      SOCIAL HISTORY: Social History   Socioeconomic History   Marital status: Single    Spouse name: Not on file   Number of children: Not on file   Years of education: Not on file   Highest education level: Not on file  Occupational History   Occupation: Child psychotherapist  Tobacco Use   Smoking status: Never   Smokeless tobacco: Never  Vaping Use   Vaping status: Never Used  Substance and Sexual Activity   Alcohol use: No   Drug use: Not Currently    Frequency: 2.0 times per week   Sexual activity: Not Currently    Birth control/protection: I.U.D.    Comment: Mirena  Other Topics Concern   Not on file  Social History Narrative   Not on file   Social Drivers of Health   Financial Resource Strain: High Risk (03/14/2022)   Overall Financial Resource Strain (CARDIA)    Difficulty of Paying Living Expenses: Hard  Food Insecurity: Not on file  Transportation Needs: Not on file  Physical Activity: Not on file  Stress: Not on file  Social Connections: Not on file  Intimate Partner Violence: Not on file    FAMILY HISTORY: Family History  Problem Relation Age of Onset   Hypertension Mother    High Cholesterol Mother    Thyroid disease Mother    Cancer Mother    Sleep apnea Mother    Hypertension Father    Heart attack Father    Sudden death Father    Diabetes Maternal Grandmother    Bone cancer Maternal Grandfather        dx after 22   Breast cancer Maternal Aunt        dx 30s   Lung cancer Maternal Aunt        dx after 50    Review of Systems  Constitutional:  Positive for fatigue (Mild fatigue). Negative for appetite change, chills, fever and unexpected weight change.  HENT:   Negative for hearing loss, lump/mass and trouble swallowing.   Eyes:  Negative for eye problems and icterus.  Respiratory:  Negative for chest tightness, cough and  shortness of breath.   Cardiovascular:  Negative for chest pain, leg swelling and palpitations.  Gastrointestinal:  Negative for abdominal distention, abdominal pain, constipation, diarrhea, nausea and vomiting.  Endocrine: Negative for hot flashes.  Genitourinary:  Negative for difficulty urinating.   Musculoskeletal:  Negative for arthralgias.  Skin:  Negative for itching and rash.  Neurological:  Positive for headaches (Occasional, none currently). Negative for dizziness, extremity weakness and numbness.  Hematological:  Negative for adenopathy. Does not bruise/bleed easily.  Psychiatric/Behavioral:  Negative for depression. The patient is not nervous/anxious.       PHYSICAL EXAMINATION  See vitals and CHL  There were no vitals filed for this visit.  Physical Exam  Constitutional:      General: She is not in acute distress.    Appearance: Normal appearance. She is not toxic-appearing.  HENT:     Head: Normocephalic and atraumatic.     Mouth/Throat:     Mouth: Mucous membranes are moist.     Pharynx: Oropharynx is clear. No oropharyngeal exudate or posterior oropharyngeal erythema.  Eyes:     General: No scleral icterus. Cardiovascular:     Rate and Rhythm: Normal rate and regular rhythm.     Pulses: Normal pulses.     Heart sounds: Normal heart sounds.  Pulmonary:     Effort: Pulmonary effort is normal.     Breath sounds: Normal breath sounds.  Abdominal:     General: Abdomen is flat. Bowel sounds are normal. There is no distension.     Palpations: Abdomen is soft.     Tenderness: There is no abdominal tenderness.  Musculoskeletal:        General: No swelling.     Cervical back: Neck supple.  Lymphadenopathy:     Cervical: No cervical adenopathy.  Skin:    General: Skin is warm and dry.     Findings: No rash.  Neurological:     General: No focal deficit present.     Mental Status: She is alert.  Psychiatric:        Mood and Affect: Mood normal.        Behavior:  Behavior normal.     LABORATORY DATA:  CBC    Component Value Date/Time   WBC 3.7 (L) 03/19/2023 0950   WBC 5.3 11/28/2021 0901   RBC 4.11 03/19/2023 0950   HGB 11.3 (L) 03/19/2023 0950   HGB 13.2 09/22/2019 1040   HCT 34.3 (L) 03/19/2023 0950   HCT 41.3 09/22/2019 1040   PLT 133 (L) 03/19/2023 0950   PLT 247 09/22/2019 1040   MCV 83.5 03/19/2023 0950   MCV 83 09/22/2019 1040   MCH 27.5 03/19/2023 0950   MCHC 32.9 03/19/2023 0950   RDW 16.4 (H) 03/19/2023 0950   RDW 14.7 09/22/2019 1040   LYMPHSABS 0.9 03/19/2023 0950   MONOABS 0.5 03/19/2023 0950   EOSABS 0.3 03/19/2023 0950   BASOSABS 0.0 03/19/2023 0950    CMP     Component Value Date/Time   NA 143 03/19/2023 0950   NA 142 09/22/2019 1040   K 3.0 (L) 03/19/2023 0950   CL 110 03/19/2023 0950   CO2 29 03/19/2023 0950   GLUCOSE 108 (H) 03/19/2023 0950   BUN 6 03/19/2023 0950   BUN 11 09/22/2019 1040   CREATININE 0.66 03/19/2023 0950   CALCIUM 8.8 (L) 03/19/2023 0950   PROT 6.2 (L) 03/19/2023 0950   PROT 6.7 06/17/2019 1054   ALBUMIN 3.3 (L) 03/19/2023 0950   ALBUMIN 4.5 06/17/2019 1054   AST 46 (H) 03/19/2023 0950   ALT 30 03/19/2023 0950   ALKPHOS 97 03/19/2023 0950   BILITOT 1.6 (H) 03/19/2023 0950   GFRNONAA >60 03/19/2023 0950   GFRAA 77 09/22/2019 1040        ASSESSMENT and THERAPY PLAN:   Malignant neoplasm of lower-outer quadrant of right breast of female, estrogen receptor positive (HCC) 06/02/2020:Right lumpectomy ([facility]): invasive and in situ ductal carcinoma, 1.3cm, clear margins, 1 right axillary lymph node negative for carcinoma. ER 15% weak, PR-,HER-2 positive (3+) Ki67 80%.   Treatment plan: 1.  Adjuvant chemotherapy with Taxol Herceptin followed by Herceptin maintenance.  Stopped after 11 cycles of Taxol Herceptin maintenance  completed 07/07/2021  2.  Adjuvant radiation therapy 11/23/2020-12/19/2020 3.  Adjuvant antiestrogen therapy with tamoxifen started October 2022 4.  CT angiogram  07/13/2021: Right supraclavicular lymph node 5 cm, right internal mammary lymph nodes 1.8 cm 5.  Metastatic breast cancer: June 2023:Right supraclavicular lymphadenopathy: Biopsy: Metastatic poorly differentiated adenocarcinoma, ER +40%, PR negative, HER2 positive 3+ PET CT scan 08/06/21: Ext Right Supra clav LN along with Internal mammary and Rt Upper Mediastinal and Rt Axillary LN    CT chest abdomen pelvis 06/25/2022: Stable exam.  No new or progressive findings. CT CAP 10/04/2022: Stable findings without new or progressive disease CT CAP 02/22/2023: Stable findings without new or progressive disease.  Pneumonia that was noted on chest x-ray from earlier in the month was again noted. -----------------------------------------------------------------------------------------------------  Current treatment: Enhertu started on 08/21/2021  Metastatic Breast Cancer Stable disease on recent imaging. No new symptoms related to cancer.  No signs of breast cancer progression. -Continue treatment with Enhertu -Echo overdue, scheduled in early February with Dr. Gala Romney  Hypertension BP elevated today, likely in setting of being off medication during previous viral illness.   -I recommended she check her BP at home -Encouraged compliance with anti-hypertensives -If remains elevated or headache continues with elevation, recommended she reach out to Dr. Prescott Gum office  RTC in 4 weeks for labs, f/u, and next treatment.     All questions were answered. The patient knows to call the clinic with any problems, questions or concerns. We can certainly see the patient much sooner if necessary.  Total encounter time:20 minutes*in face-to-face visit time, chart review, lab review, care coordination, order entry, and documentation of the encounter time.    Lillard Anes, NP 03/21/23 10:13 PM Medical Oncology and Hematology [facility] [address] [place], [place] [address] Tel.  [phone]    Fax. [phone]  *Total Encounter Time as defined by the Centers for Medicare and Medicaid Services includes, in addition to the face-to-face time of a patient visit (documented in the note above) non-face-to-face time: obtaining and reviewing outside history, ordering and reviewing medications, tests or procedures, care coordination (communications with other health care professionals or caregivers) and documentation in the medical record.

## 2023-03-27 ENCOUNTER — Encounter: Payer: Self-pay | Admitting: Licensed Clinical Social Worker

## 2023-03-27 NOTE — Progress Notes (Signed)
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 Provided supporting letter that is required for application.    Pamela [name] E Yashar Inclan, LCSW Clinical Social Worker [facility]

## 2023-03-29 ENCOUNTER — Ambulatory Visit: Payer: 59 | Admitting: Adult Health

## 2023-03-29 ENCOUNTER — Other Ambulatory Visit: Payer: 59

## 2023-03-29 ENCOUNTER — Ambulatory Visit: Payer: 59

## 2023-04-04 ENCOUNTER — Ambulatory Visit: Payer: 59 | Admitting: Adult Health

## 2023-04-04 ENCOUNTER — Other Ambulatory Visit: Payer: 59

## 2023-04-04 ENCOUNTER — Ambulatory Visit: Payer: 59

## 2023-04-11 ENCOUNTER — Ambulatory Visit (HOSPITAL_BASED_OUTPATIENT_CLINIC_OR_DEPARTMENT_OTHER)
Admission: RE | Admit: 2023-04-11 | Discharge: 2023-04-11 | Disposition: A | Discharge status: Home / Self Care | Payer: 59 | Source: Ambulatory Visit | Attending: Internal Medicine | Admitting: Internal Medicine

## 2023-04-11 ENCOUNTER — Encounter (HOSPITAL_COMMUNITY): Payer: Self-pay | Admitting: Internal Medicine

## 2023-04-11 ENCOUNTER — Ambulatory Visit (HOSPITAL_COMMUNITY)
Admission: RE | Admit: 2023-04-11 | Discharge: 2023-04-11 | Disposition: A | Discharge status: Home / Self Care | Payer: 59 | Source: Ambulatory Visit | Attending: Internal Medicine | Admitting: Internal Medicine

## 2023-04-11 VITALS — BP 144/80 | HR 78 | Wt 310.0 lb

## 2023-04-11 DIAGNOSIS — I110 Hypertensive heart disease with heart failure: Secondary | ICD-10-CM | POA: Insufficient documentation

## 2023-04-11 DIAGNOSIS — E876 Hypokalemia: Secondary | ICD-10-CM | POA: Diagnosis not present

## 2023-04-11 DIAGNOSIS — I10 Essential (primary) hypertension: Secondary | ICD-10-CM | POA: Diagnosis present

## 2023-04-11 DIAGNOSIS — C50511 Malignant neoplasm of lower-outer quadrant of right female breast: Secondary | ICD-10-CM

## 2023-04-11 DIAGNOSIS — G4730 Sleep apnea, unspecified: Secondary | ICD-10-CM | POA: Insufficient documentation

## 2023-04-11 DIAGNOSIS — Z7969 Long term (current) use of other immunomodulators and immunosuppressants: Secondary | ICD-10-CM | POA: Diagnosis not present

## 2023-04-11 DIAGNOSIS — R400 Somnolence: Secondary | ICD-10-CM | POA: Diagnosis not present

## 2023-04-11 DIAGNOSIS — Z170 Estrogen receptor positive status [ER+]: Secondary | ICD-10-CM | POA: Diagnosis not present

## 2023-04-11 DIAGNOSIS — R9431 Abnormal electrocardiogram [ECG] [EKG]: Secondary | ICD-10-CM | POA: Diagnosis not present

## 2023-04-11 DIAGNOSIS — Z6841 Body Mass Index (BMI) 40.0 and over, adult: Secondary | ICD-10-CM | POA: Diagnosis not present

## 2023-04-11 DIAGNOSIS — R0683 Snoring: Secondary | ICD-10-CM | POA: Diagnosis not present

## 2023-04-11 DIAGNOSIS — E66813 Obesity, class 3: Secondary | ICD-10-CM

## 2023-04-11 DIAGNOSIS — E785 Hyperlipidemia, unspecified: Secondary | ICD-10-CM | POA: Diagnosis not present

## 2023-04-11 DIAGNOSIS — I427 Cardiomyopathy due to drug and external agent: Secondary | ICD-10-CM

## 2023-04-11 DIAGNOSIS — T451X5A Adverse effect of antineoplastic and immunosuppressive drugs, initial encounter: Secondary | ICD-10-CM

## 2023-04-11 DIAGNOSIS — C770 Secondary and unspecified malignant neoplasm of lymph nodes of head, face and neck: Secondary | ICD-10-CM | POA: Insufficient documentation

## 2023-04-11 DIAGNOSIS — C50911 Malignant neoplasm of unspecified site of right female breast: Secondary | ICD-10-CM | POA: Diagnosis present

## 2023-04-11 DIAGNOSIS — I509 Heart failure, unspecified: Secondary | ICD-10-CM | POA: Insufficient documentation

## 2023-04-11 DIAGNOSIS — R0602 Shortness of breath: Secondary | ICD-10-CM | POA: Diagnosis present

## 2023-04-11 LAB — ECHOCARDIOGRAM COMPLETE
AR max vel: 2.06 cm2
AV Area VTI: 2.16 cm2
AV Area mean vel: 1.76 cm2
AV Mean grad: 6 mm[Hg]
AV Peak grad: 10.5 mm[Hg]
Ao pk vel: 1.62 m/s
Area-P 1/2: 3.68 cm2
Calc EF: 68.5 %
MV VTI: 2.79 cm2
S' Lateral: 3 cm
Single Plane A2C EF: 73.1 %
Single Plane A4C EF: 65.7 %

## 2023-04-11 MED ORDER — SPIRONOLACTONE 25 MG PO TABS: 25.0000 mg | ORAL_TABLET | Freq: Every day | ORAL | 6 refills | Status: DC

## 2023-04-11 NOTE — Patient Instructions (Signed)
 Great to see you today!!!  START Spironolactone  25 mg (1 tab) Daily  Your physician recommends that you schedule a follow-up appointment in: 4 months with an echocardiogram (June), **PLEASE CALL OUR OFFICE IN APRIL TO SCHEDULE THIS APPOINTMENT  If you have any questions or concerns before your next appointment please send us  a message through mychart or call our office at [phone].    TO LEAVE A MESSAGE FOR THE NURSE SELECT OPTION 2, PLEASE LEAVE A MESSAGE INCLUDING: YOUR NAME DATE OF BIRTH CALL BACK NUMBER REASON FOR CALL**this is important as we prioritize the call backs  YOU WILL RECEIVE A CALL BACK THE SAME DAY AS LONG AS YOU CALL BEFORE 4:00 PM

## 2023-04-11 NOTE — Progress Notes (Signed)
 CARDIO-ONCOLOGY CLINIC NOTE  Referring Physician: Dr. Odean Primary Care: [name], MD Primary Cardiologist: Dr. Court  HPI:  Pamela [name] is 58 y.o. female with obesity, HTN, HL and right breast cancer  diagnosed referred by Dr. Gudena  for enrollment into the Cardio-Oncology program.   Malignant neoplasm of lower-outer quadrant of right breast of female, estrogen receptor positive (HCC)  05/03/2020 Initial Diagnosis    Screening mammogram showed a 1.0cm mass at the 6 o'clock position in the right breast. Diagnostic mammogram and US  showed the 1.2cm mass at the 6 o'clock position in the right breast. Biopsy showed IDC, grade 3, HER-2 positive (3+), ER 15% weak, PR-, Ki67 80%.    05/11/2020 Cancer Staging    Staging form: Breast, AJCC 8th Edition - Clinical stage from 05/11/2020: Stage IA (cT1b, cN0, cM0, G3, ER+, PR-, HER2+) - Signed by Odean Potts, MD on 05/11/2020 Stage prefix: Initial diagnosis    05/19/2020 Genetic Testing    Negative hereditary cancer genetic testing: no pathogenic variants detected in [facility] Breast STAT Panel and Common Hereditary Cancers Panel.  The report dates are May 19, 2020 (STAT) and May 23, 2020 (Common Hereditary).    The Common Hereditary Cancers Panel offered by [facility] includes sequencing and/or deletion duplication testing of the following 47 genes: APC, ATM, AXIN2, BARD1, BMPR1A, BRCA1, BRCA2, BRIP1, CDH1, CDK4, CDKN2A (p14ARF), CDKN2A (p16INK4a), CHEK2, CTNNA1, DICER1, EPCAM (Deletion/duplication testing only), GREM1 (promoter region deletion/duplication testing only), GREM1, HOXB13, KIT, MEN1, MLH1, MSH2, MSH3, MSH6, MUTYH, NBN, NF1, NHTL1, PALB2, PDGFRA, PMS2, POLD1, POLE, PTEN, RAD50, RAD51C, RAD51D, SDHA, SDHB, SDHC, SDHD, SMAD4, SMARCA4. STK11, TP53, TSC1, TSC2, and VHL.  The following genes were evaluated for sequence changes only: SDHA and HOXB13 c.251G>A variant only.es only: SDHA and HOXB13 c.251G>A variant only.    06/02/2020 Surgery     Right lumpectomy ([facility]): invasive and in situ ductal carcinoma, 1.3cm, clear margins, 1 right axillary lymph node negative for carcinoma.    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 Denies known heart disease.  Diagnosed with HER-2+ breast CA in 3/22. Treated with lumpectomy in 3/22. Then Taxol /Herceptin  12 rounds from 5/22-8/22. Now on Herceptin  q 3 weeks. Finished XRT in 10/22.   Finished Herceptin  07/08/21. Found to have recurrent disease in lymph node in her neck. Enhertu  started on 08/21/21  Here for routine f/u with echo. Remains on Ehhertu every 4 weeks. Tolerating well. Had PNA in November 24 and more recently had the flu. Now feeling better. Feels ok. No edema or undue SOB. BP has been running high   Echo today 04/11/23 EF 60-65% GLS in 2-chamber (only accurate) -18.5%  Personally reviewed   Echo 08/21/22 EF 60-65%    Echo 03/28/21: EF 55-60% mod LVVH G1DD GLS -18.0  Echo 09/21/21 EF 60-65% Mild to mod LVH G1DD GLS -15.9% (underestimated)  Echo 4/23 EF 55% GLS -19.9% Echo1/17/22 Echo 55-60%  Mod LVH G2DD GLS 15.0% Echo 05/20/20: EF 60-65% GLS -21.2% Echo 08/30/20: EF 60-65% GLS -  20.7% Echo 10/22: EF 60-65% GLS reported as -13.3%    Past Medical History:  Diagnosis Date   Anxiety    Asthma    Back pain    Breast cancer (HCC)    Constipation    DUB (dysfunctional uterine bleeding) 2007   Edema of both lower extremities    Elevated cholesterol    Family history of breast cancer 05/12/2020   Family history of lung cancer 05/12/2020    Food allergy    H/O menorrhagia 05/01/2006   High cholesterol    History of ovarian cyst 2007   Hypertension 03/31/2005   Increased BMI 07/11/2006   Joint pain    Migraine    N&V (nausea and vomiting)    Obesity 2007   Obstructive sleep apnea syndrome, moderate 10/27/2013   no cpap   Personal history of chemotherapy    Personal history of radiation therapy    Sleep apnea    SOB (shortness of breath)    Vitamin D  deficiency    Vitamin D  deficiency    Weight loss 07/11/2006    Current Outpatient Medications  Medication Sig Dispense Refill   albuterol  (VENTOLIN  HFA) 108 (90 Base) MCG/ACT inhaler Inhale 2 puffs into the lungs every 6 (six) hours as needed for wheezing or shortness of breath. 8 g 2   aspirin  EC 81 MG tablet Take 1 tablet (81 mg total) by mouth daily. Swallow whole. 30 tablet 11   benzonatate  (TESSALON ) 100 MG capsule Take 1 capsule (100 mg total) by mouth 3 (three) times daily as needed for cough. 30 capsule 0   chlorthalidone  (HYGROTON ) 25 MG tablet Take 1 tablet (25 mg total) by mouth daily. 90 tablet 1   furosemide  (LASIX ) 20 MG tablet Take 1 tablet (20 mg total) by mouth as needed. TAKE 1 TABLET AS NEEDED FOR SWELLING OR SHORTNESS OF BREATH OR WEIGHT GAIN. 3 POUNDS WITHIN 24HR OR 5 POUNDS 7 DAYS 90 tablet 3   levonorgestrel  (MIRENA , 52 MG,) 20 MCG/DAY IUD 1 each by Intrauterine route once.     lidocaine -prilocaine  (EMLA ) cream Apply to affected area once AS DIRECTED 30 g 3   potassium chloride  SA (KLOR-CON  M) 10 MEQ tablet Take 4 tablets (40 mEq total) by mouth daily. 120 tablet 3   rosuvastatin  (CRESTOR ) 40 MG tablet Take 1 tablet (40 mg total) by mouth daily. 90 tablet 3   Spacer/Aero-Holding Chambers (AEROCHAMBER PLUS) Device Please dispense spacer for inhaler use 1 each 0   valsartan  (DIOVAN ) 160 MG tablet Take 1 tablet (160 mg total) by mouth daily. 30 tablet 11   No current facility-administered medications for this encounter.   Facility-Administered  Medications Ordered in Other Encounters  Medication Dose Route Frequency Provider Last Rate Last Admin   heparin  lock flush 100 unit/mL  500 Units Intracatheter Once Odean Potts, MD       sodium chloride  flush (NS) 0.9 % injection 10 mL  10 mL Intracatheter Once Gudena, Vinay, MD        Allergies  Allergen Reactions   Pollen Extract Shortness Of Breath   Compazine  [Prochlorperazine ] Anxiety    IV compazine  causes severe anxiety attack   Latex Rash   Codeine Hives   Diflucan  [Fluconazole ] Hives   Shellfish Allergy Hives   Betadine [Povidone Iodine] Rash      Social History   Socioeconomic History   Marital status: Single    Spouse name: Not on file   Number of children: Not on file  Years of education: Not on file   Highest education level: Not on file  Occupational History   Occupation: Child Psychotherapist  Tobacco Use   Smoking status: Never   Smokeless tobacco: Never  Vaping Use   Vaping status: Never Used  Substance and Sexual Activity   Alcohol use: No   Drug use: Not Currently    Frequency: 2.0 times per week   Sexual activity: Not Currently    Birth control/protection: I.U.D.    Comment: Mirena   Other Topics Concern   Not on file  Social History Narrative   Not on file   Social Drivers of Health   Financial Resource Strain: High Risk (03/14/2022)   Overall Financial Resource Strain (CARDIA)    Difficulty of Paying Living Expenses: Hard  Food Insecurity: Not on file  Transportation Needs: Not on file  Physical Activity: Not on file  Stress: Not on file  Social Connections: Not on file  Intimate Partner Violence: Not on file      Family History  Problem Relation Age of Onset   Hypertension Mother    High Cholesterol Mother    Thyroid  disease Mother    Cancer Mother    Sleep apnea Mother    Hypertension Father    Heart attack Father    Sudden death Father    Diabetes Maternal Grandmother    Bone cancer Maternal Grandfather        dx after 43    Breast cancer Maternal Aunt        dx 30s   Lung cancer Maternal Aunt        dx after 50    Vitals:   04/11/23 1444  BP: (!) 144/80  Pulse: 78  SpO2: 99%  Weight: (!) 140.6 kg (310 lb)      Wt Readings from Last 3 Encounters:  04/11/23 (!) 140.6 kg (310 lb)  03/07/23 135.4 kg (298 lb 6.4 oz)  02/07/23 (!) 140.8 kg (310 lb 8 oz)    PHYSICAL EXAM: General:  Obese woman No resp difficulty HEENT: normal Neck: supple. no JVD. Carotids 2+ bilat; no bruits. No lymphadenopathy or thryomegaly appreciated. Cor: PMI nondisplaced. Regular rate & rhythm. No rubs, gallops or murmurs. Lungs: clear Abdomen: obese  soft, nontender, nondistended.Good bowel sounds. Extremities: no cyanosis, clubbing, rash, edema Neuro: alert & orientedx3, cranial nerves grossly intact. moves all 4 extremities w/o difficulty. Affect pleasant  ASSESSMENT & PLAN:  1. Right Breast Cancer, stage IV - HER2 + - Treated with lumpectomy in 3/22. Then Taxol /Herceptin  12 rounds from 5/22-8/22. Now on Herceptin  q 3 weeks. Also about to finish XRT.  - Echo 10/22 EF 60-65% GLS reported as -13.3%. I have reviewed this and reading is inaccurate due to poor endocardial tracking - Echo  03/21/20 EF 55-60%  Mod LVH G2DD GLS 15.0% - Echo 06/22/21 EF 55% Mild LVH G1DD GLS -19.9% - Finished Herceptin  07/08/21. Found to have recurrent disease in lymph node in her neck. Enhertu  started on 08/21/21.  - Echo 09/21/21 EF 60-65% Mild to mod LVH G1DD GLS -15.9% (underestimated) - Personally reviewed - Echo 03/28/21: EF 55-60% mod LVVH G1DD GLS -18.0  - Echo 08/20/22: EF 60-65% - Echo today 04/11/23 EF 60-65% GLS in 2-chamber (only accurate) -18.5%  Personally reviewed - Given stability with change echos to every 4 months while on EnHertu   2. HTN with LVH on echo  - Blood pressure high  - add spiro - Home sleep study ordered previously. Not completed -  can use lasix  20 prn for edema  3. Snoring/daytime somnolence - pending sleep study.  Has WatchPat at home but never did it. I encouraged her to do it   4. Morbid obesity - has enrolled in weight loss clinic - Body mass index is 45.78 kg/m. - Insurance would not pay for GLP1RA  5. Hypokalemia - K remains low despite supp  - will add spiro   Pamela Danish, MD  3:15 PM

## 2023-04-11 NOTE — Progress Notes (Signed)
  Echocardiogram 2D Echocardiogram has been performed.  Jael Waldorf L Myrtis Maille RDCS 04/11/2023, 2:51 PM

## 2023-04-16 MED FILL — Fosaprepitant Dimeglumine For IV Infusion 150 MG (Base Eq): INTRAVENOUS | Qty: 5 | Status: AC

## 2023-04-17 ENCOUNTER — Inpatient Hospital Stay: Payer: 59 | Admitting: Hematology and Oncology

## 2023-04-17 ENCOUNTER — Inpatient Hospital Stay: Payer: 59 | Attending: Hematology and Oncology

## 2023-04-17 ENCOUNTER — Inpatient Hospital Stay (HOSPITAL_BASED_OUTPATIENT_CLINIC_OR_DEPARTMENT_OTHER): Payer: 59 | Admitting: Hematology and Oncology

## 2023-04-17 ENCOUNTER — Inpatient Hospital Stay: Payer: 59

## 2023-04-17 VITALS — BP 160/91 | HR 63 | Resp 18

## 2023-04-17 VITALS — BP 155/83 | HR 69 | Temp 97.9°F | Resp 19 | Ht 69.0 in | Wt 308.4 lb

## 2023-04-17 DIAGNOSIS — C50919 Malignant neoplasm of unspecified site of unspecified female breast: Secondary | ICD-10-CM

## 2023-04-17 DIAGNOSIS — R110 Nausea: Secondary | ICD-10-CM

## 2023-04-17 DIAGNOSIS — E876 Hypokalemia: Secondary | ICD-10-CM | POA: Diagnosis not present

## 2023-04-17 DIAGNOSIS — Z883 Allergy status to other anti-infective agents status: Secondary | ICD-10-CM | POA: Diagnosis not present

## 2023-04-17 DIAGNOSIS — Z170 Estrogen receptor positive status [ER+]: Secondary | ICD-10-CM | POA: Diagnosis not present

## 2023-04-17 DIAGNOSIS — J111 Influenza due to unidentified influenza virus with other respiratory manifestations: Secondary | ICD-10-CM | POA: Insufficient documentation

## 2023-04-17 DIAGNOSIS — N6315 Unspecified lump in the right breast, overlapping quadrants: Secondary | ICD-10-CM | POA: Insufficient documentation

## 2023-04-17 DIAGNOSIS — F419 Anxiety disorder, unspecified: Secondary | ICD-10-CM | POA: Insufficient documentation

## 2023-04-17 DIAGNOSIS — C50511 Malignant neoplasm of lower-outer quadrant of right female breast: Secondary | ICD-10-CM | POA: Insufficient documentation

## 2023-04-17 DIAGNOSIS — Z885 Allergy status to narcotic agent status: Secondary | ICD-10-CM | POA: Diagnosis not present

## 2023-04-17 DIAGNOSIS — Z888 Allergy status to other drugs, medicaments and biological substances status: Secondary | ICD-10-CM | POA: Insufficient documentation

## 2023-04-17 DIAGNOSIS — Z5112 Encounter for antineoplastic immunotherapy: Secondary | ICD-10-CM | POA: Insufficient documentation

## 2023-04-17 DIAGNOSIS — I10 Essential (primary) hypertension: Secondary | ICD-10-CM | POA: Diagnosis not present

## 2023-04-17 DIAGNOSIS — Z79899 Other long term (current) drug therapy: Secondary | ICD-10-CM | POA: Diagnosis not present

## 2023-04-17 DIAGNOSIS — Z1722 Progesterone receptor negative status: Secondary | ICD-10-CM | POA: Insufficient documentation

## 2023-04-17 DIAGNOSIS — I700 Atherosclerosis of aorta: Secondary | ICD-10-CM | POA: Diagnosis not present

## 2023-04-17 DIAGNOSIS — R918 Other nonspecific abnormal finding of lung field: Secondary | ICD-10-CM | POA: Diagnosis not present

## 2023-04-17 DIAGNOSIS — Z95828 Presence of other vascular implants and grafts: Secondary | ICD-10-CM

## 2023-04-17 DIAGNOSIS — Z8701 Personal history of pneumonia (recurrent): Secondary | ICD-10-CM | POA: Diagnosis not present

## 2023-04-17 LAB — CBC WITH DIFFERENTIAL (CANCER CENTER ONLY)
Abs Immature Granulocytes: 0.01 10*3/uL (ref 0.00–0.07)
Basophils Absolute: 0.1 10*3/uL (ref 0.0–0.1)
Basophils Relative: 1 %
Eosinophils Absolute: 0.4 10*3/uL (ref 0.0–0.5)
Eosinophils Relative: 12 %
HCT: 36.7 % (ref 36.0–46.0)
Hemoglobin: 11.9 g/dL — ABNORMAL LOW (ref 12.0–15.0)
Immature Granulocytes: 0 %
Lymphocytes Relative: 28 %
Lymphs Abs: 1 10*3/uL (ref 0.7–4.0)
MCH: 27.4 pg (ref 26.0–34.0)
MCHC: 32.4 g/dL (ref 30.0–36.0)
MCV: 84.6 fL (ref 80.0–100.0)
Monocytes Absolute: 0.5 10*3/uL (ref 0.1–1.0)
Monocytes Relative: 15 %
Neutro Abs: 1.5 10*3/uL — ABNORMAL LOW (ref 1.7–7.7)
Neutrophils Relative %: 44 %
Platelet Count: 169 10*3/uL (ref 150–400)
RBC: 4.34 MIL/uL (ref 3.87–5.11)
RDW: 16.3 % — ABNORMAL HIGH (ref 11.5–15.5)
WBC Count: 3.5 10*3/uL — ABNORMAL LOW (ref 4.0–10.5)
nRBC: 0 % (ref 0.0–0.2)

## 2023-04-17 LAB — CMP (CANCER CENTER ONLY)
ALT: 24 U/L (ref 0–44)
AST: 37 U/L (ref 15–41)
Albumin: 3.4 g/dL — ABNORMAL LOW (ref 3.5–5.0)
Alkaline Phosphatase: 108 U/L (ref 38–126)
Anion gap: 4 — ABNORMAL LOW (ref 5–15)
BUN: 8 mg/dL (ref 6–20)
CO2: 29 mmol/L (ref 22–32)
Calcium: 9 mg/dL (ref 8.9–10.3)
Chloride: 107 mmol/L (ref 98–111)
Creatinine: 0.67 mg/dL (ref 0.44–1.00)
GFR, Estimated: >60 mL/min (ref >60)
Glucose, Bld: 103 mg/dL — ABNORMAL HIGH (ref 70–99)
Potassium: 3.3 mmol/L — ABNORMAL LOW (ref 3.5–5.1)
Sodium: 140 mmol/L (ref 135–145)
Total Bilirubin: 1.9 mg/dL — ABNORMAL HIGH (ref 0.0–1.2)
Total Protein: 6.5 g/dL (ref 6.5–8.1)

## 2023-04-17 MED ORDER — POTASSIUM CHLORIDE 10 MEQ/100ML IV SOLN: 10.0000 meq | Freq: Once | INTRAVENOUS | Status: DC

## 2023-04-17 MED ORDER — POTASSIUM CHLORIDE 10 MEQ/100ML IV SOLN
10.0000 meq | INTRAVENOUS | Status: AC
  Administered 2023-04-17: 10 meq via INTRAVENOUS
  Filled 2023-04-17: qty 100

## 2023-04-17 MED ORDER — ACETAMINOPHEN 325 MG PO TABS
650.0000 mg | ORAL_TABLET | Freq: Once | ORAL | Status: AC
Start: 2023-04-17 — End: 2023-04-17
  Administered 2023-04-17: 650 mg via ORAL
  Filled 2023-04-17: qty 2

## 2023-04-17 MED ORDER — FAM-TRASTUZUMAB DERUXTECAN-NXKI CHEMO 100 MG IV SOLR
300.0000 mg | Freq: Once | INTRAVENOUS | Status: AC
  Administered 2023-04-17: 300 mg via INTRAVENOUS
  Filled 2023-04-17: qty 15

## 2023-04-17 MED ORDER — SODIUM CHLORIDE 0.9% FLUSH
10.0000 mL | INTRAVENOUS | Status: DC | PRN
  Administered 2023-04-17: 10 mL

## 2023-04-17 MED ORDER — SODIUM CHLORIDE 0.9 % IV SOLN
150.0000 mg | Freq: Once | INTRAVENOUS | Status: AC
  Administered 2023-04-17: 150 mg via INTRAVENOUS
  Filled 2023-04-17: qty 150

## 2023-04-17 MED ORDER — CYANOCOBALAMIN 1000 MCG/ML IJ SOLN
1000.0000 ug | Freq: Once | INTRAMUSCULAR | Status: AC
  Administered 2023-04-17: 1000 ug via INTRAMUSCULAR
  Filled 2023-04-17: qty 1

## 2023-04-17 MED ORDER — SODIUM CHLORIDE 0.9% FLUSH
10.0000 mL | Freq: Once | INTRAVENOUS | Status: AC
Start: 2023-04-17 — End: 2023-04-17
  Administered 2023-04-17: 10 mL

## 2023-04-17 MED ORDER — SODIUM CHLORIDE 0.9 % IV SOLN: INTRAVENOUS | Status: AC

## 2023-04-17 MED ORDER — DEXTROSE 5 % IV SOLN: Freq: Once | INTRAVENOUS | Status: AC

## 2023-04-17 MED ORDER — PALONOSETRON HCL INJECTION 0.25 MG/5ML
0.2500 mg | Freq: Once | INTRAVENOUS | Status: AC
  Administered 2023-04-17: 0.25 mg via INTRAVENOUS
  Filled 2023-04-17: qty 5

## 2023-04-17 MED ORDER — HEPARIN SOD (PORK) LOCK FLUSH 100 UNIT/ML IV SOLN
500.0000 [IU] | Freq: Once | INTRAVENOUS | Status: AC | PRN
Start: 2023-04-17 — End: 2023-04-17
  Administered 2023-04-17: 500 [IU]

## 2023-04-17 NOTE — Progress Notes (Signed)
Patient Care Team: Dorothyann Peng, MD as PCP - General (Internal Medicine) Kathryne Hitch, MD as Consulting Physician (Orthopedic Surgery) Pershing Proud, RN as Oncology Nurse Navigator Donnelly Angelica, RN as Oncology Nurse Navigator Harriette Bouillon, MD as Consulting Physician (General Surgery) Serena Croissant, MD as Consulting Physician (Hematology and Oncology) Lonie Peak, MD as Attending Physician (Radiation Oncology)  DIAGNOSIS:  Encounter Diagnosis  Name Primary?   Malignant neoplasm of lower-outer quadrant of right breast of female, estrogen receptor positive (HCC) Yes    SUMMARY OF ONCOLOGIC HISTORY: Oncology History  Malignant neoplasm of lower-outer quadrant of right breast of female, estrogen receptor positive (HCC)  05/03/2020 Initial Diagnosis   Screening mammogram showed a 1.0cm mass at the 6 o'clock position in the right breast. Diagnostic mammogram and US showed the 1.2cm mass at the 6 o'clock position in the right breast. Biopsy showed IDC, grade 3, HER-2 positive (3+), ER 15% weak, PR-, Ki67 80%.   05/19/2020 Genetic Testing   Negative hereditary cancer genetic testing: no pathogenic variants detected in [facility] Breast STAT Panel and Common Hereditary Cancers Panel.  The report dates are May 19, 2020 (STAT) and May 23, 2020 (Common Hereditary).   The Common Hereditary Cancers Panel offered by [facility] includes sequencing and/or deletion duplication testing of the following 47 genes: APC, ATM, AXIN2, BARD1, BMPR1A, BRCA1, BRCA2, BRIP1, CDH1, CDK4, CDKN2A (p14ARF), CDKN2A (p16INK4a), CHEK2, CTNNA1, DICER1, EPCAM (Deletion/duplication testing only), GREM1 (promoter region deletion/duplication testing only), GREM1, HOXB13, KIT, MEN1, MLH1, MSH2, MSH3, MSH6, MUTYH, NBN, NF1, NHTL1, PALB2, PDGFRA, PMS2, POLD1, POLE, PTEN, RAD50, RAD51C, RAD51D, SDHA, SDHB, SDHC, SDHD, SMAD4, SMARCA4. STK11, TP53, TSC1, TSC2, and VHL.  The following genes were evaluated for sequence  changes only: SDHA and HOXB13 c.251G>A variant only.es only: SDHA and HOXB13 c.251G>A variant only.   06/02/2020 Surgery   Right lumpectomy ([facility]): invasive and in situ ductal carcinoma, 1.3cm, clear margins, 1 right axillary lymph node negative for carcinoma.   06/02/2020 Cancer Staging   Staging form: Breast, AJCC 8th Edition - Pathologic stage from 06/02/2020: Stage IA (pT1c, pN0, cM0, G3, ER+, PR-, HER2+) - Signed by Loa Socks, NP on 06/15/2020 Stage prefix: Initial diagnosis Histologic grading system: 3 grade system   07/08/2020 - 10/21/2020 Chemotherapy   Taxol Herceptin followed by Herceptin maintenance   04/14/2021 -  Anti-estrogen oral therapy   Tamoxifen 10 mg   07/13/2021 Imaging   CT angiogram: Right supraclavicular lymph node 5 cm, right internal mammary lymph nodes 1.8 cm    08/06/2021 PET scan   PET CT scan: Ext Right Supra clav LN along with Internal mammary and Rt Upper Mediastinal and Rt Axillary LN   08/2021 Initial Biopsy   Right supraclavicular lymphadenopathy: Biopsy: Metastatic poorly differentiated adenocarcinoma, ER +40%, PR negative, HER2 positive 3+    08/21/2021 -  Chemotherapy   Enhertu every 3 weeks    03/08/2022 Imaging   CT chest/abdomen/pelvis  1. No new or progressive findings in the chest, abdomen or pelvis. 2. Signs of RIGHT breast lumpectomy with areas of central fat necrosis unchanged from previous imaging. 3. Stable small lymph nodes throughout the chest, largest with area of soft tissue about surgical clips in the RIGHT axilla. 4. Stable appearance of thoracic inlet lymph nodes, not shown to be hypermetabolic on prior PET imaging. 5. IUD in-situ. 6. Aortic atherosclerosis.   08/21/2022 Imaging   Echocardiogram  -normal LVEF at 60-65%. -no left ventricular wall motion abnormalities  -normal left ventricular diastolic function  -  mild left ventricular hypertrophy.    10/05/2022 Imaging   CT Chest, Abdomen, and Pelvis with  contrast IMPRESSION: 1. Stable examination without new or progressive finding in the chest, abdomen or pelvis to suggest recurrent or metastatic disease. 2. Stable tiny bilateral thoracic inlet lymph nodes. 3.  Aortic Atherosclerosis (ICD10-I70.0).     CHIEF COMPLIANT: Cycle 24 Enhertu  HISTORY OF PRESENT ILLNESS:  History of Present Illness   Pamela [name] is a 58 year old female who presents for follow-up of her ongoing treatments and respiratory symptoms.  She is experiencing persistent respiratory symptoms, including a cough, popping in her ears, and headaches during coughing spells. These symptoms began after contracting the flu. She had pneumonia in November and the flu in December, which have contributed to her current respiratory issues. No fever or other systemic symptoms are present.  A CT scan performed on December 21st showed a lung infiltrate, potentially related to her previous pneumonia. There were no changes in the size of lymph nodes in the right armpit and middle of the chest, and no evidence of cancer elsewhere in the body.  She is undergoing her 24th treatment session and experiences fatigue, which she believes might improve with increased physical activity.  She is currently taking medication for blood pressure and potassium management. Her potassium level has improved from 3.0 to 3.3, and she is taking spironolactone in addition to her current blood pressure medication.  She is concerned about her work environment, where she is exposed to potential infections due to inadequate accommodations and lack of mask-wearing by others. She works from home two days a week but is required to have face-to-face interactions on other days, which increases her anxiety about getting sick.         ALLERGIES:  is allergic to pollen extract, compazine [prochlorperazine], latex, codeine, diflucan [fluconazole], shellfish allergy, and betadine [povidone iodine].  MEDICATIONS:   Current Outpatient Medications  Medication Sig Dispense Refill   albuterol (VENTOLIN HFA) 108 (90 Base) MCG/ACT inhaler Inhale 2 puffs into the lungs every 6 (six) hours as needed for wheezing or shortness of breath. 8 g 2   aspirin EC 81 MG tablet Take 1 tablet (81 mg total) by mouth daily. Swallow whole. 30 tablet 11   benzonatate (TESSALON) 100 MG capsule Take 1 capsule (100 mg total) by mouth 3 (three) times daily as needed for cough. 30 capsule 0   chlorthalidone (HYGROTON) 25 MG tablet Take 1 tablet (25 mg total) by mouth daily. 90 tablet 1   furosemide (LASIX) 20 MG tablet Take 1 tablet (20 mg total) by mouth as needed. TAKE 1 TABLET AS NEEDED FOR SWELLING OR SHORTNESS OF BREATH OR WEIGHT GAIN. 3 POUNDS WITHIN 24HR OR 5 POUNDS 7 DAYS 90 tablet 3   levonorgestrel (MIRENA, 52 MG,) 20 MCG/DAY IUD 1 each by Intrauterine route once.     lidocaine-prilocaine (EMLA) cream Apply to affected area once AS DIRECTED 30 g 3   potassium chloride SA (KLOR-CON M) 10 MEQ tablet Take 4 tablets (40 mEq total) by mouth daily. 120 tablet 3   rosuvastatin (CRESTOR) 40 MG tablet Take 1 tablet (40 mg total) by mouth daily. 90 tablet 3   Spacer/Aero-Holding Chambers (AEROCHAMBER PLUS) Device Please dispense spacer for inhaler use 1 each 0   spironolactone (ALDACTONE) 25 MG tablet Take 1 tablet (25 mg total) by mouth daily. 30 tablet 6   valsartan (DIOVAN) 160 MG tablet Take 1 tablet (160 mg total) by mouth daily. 30  tablet 11   No current facility-administered medications for this visit.   Facility-Administered Medications Ordered in Other Visits  Medication Dose Route Frequency Provider Last Rate Last Admin   heparin lock flush 100 unit/mL  500 Units Intracatheter Once Serena Croissant, MD       sodium chloride flush (NS) 0.9 % injection 10 mL  10 mL Intracatheter Once Serena Croissant, MD        PHYSICAL EXAMINATION: ECOG PERFORMANCE STATUS: 1 - Symptomatic but completely ambulatory  Vitals:   04/17/23 1138  04/17/23 1140  BP: (!) 170/79 (!) 155/83  Pulse: 69   Resp: 19   Temp: 97.9 F (36.6 C)    Filed Weights   04/17/23 1138  Weight: (!) 308 lb 6.4 oz (139.9 kg)   LABORATORY DATA:  I have reviewed the data as listed    Latest Ref Rng & Units 04/17/2023   11:00 AM 03/19/2023    9:50 AM 03/07/2023    9:47 AM  CMP  Glucose 70 - 99 mg/dL 191  478  295   BUN 6 - 20 mg/dL 8  6  5    Creatinine 0.44 - 1.00 mg/dL 6.21  3.08  6.57   Sodium 135 - 145 mmol/L 140  143  141   Potassium 3.5 - 5.1 mmol/L 3.3  3.0  3.2   Chloride 98 - 111 mmol/L 107  110  105   CO2 22 - 32 mmol/L 29  29  31    Calcium 8.9 - 10.3 mg/dL 9.0  8.8  9.0   Total Protein 6.5 - 8.1 g/dL 6.5  6.2  6.7   Total Bilirubin 0.0 - 1.2 mg/dL 1.9  1.6  1.2   Alkaline Phos 38 - 126 U/L 108  97  89   AST 15 - 41 U/L 37  46  61   ALT 0 - 44 U/L 24  30  33     Lab Results  Component Value Date   WBC 3.5 (L) 04/17/2023   HGB 11.9 (L) 04/17/2023   HCT 36.7 04/17/2023   MCV 84.6 04/17/2023   PLT 169 04/17/2023   NEUTROABS 1.5 (L) 04/17/2023    ASSESSMENT & PLAN:  Malignant neoplasm of lower-outer quadrant of right breast of female, estrogen receptor positive (HCC) 06/02/2020:Right lumpectomy ([facility]): invasive and in situ ductal carcinoma, 1.3cm, clear margins, 1 right axillary lymph node negative for carcinoma. ER 15% weak, PR-,HER-2 positive (3+) Ki67 80%.   Treatment plan: 1.  Adjuvant chemotherapy with Taxol Herceptin followed by Herceptin maintenance.  Stopped after 11 cycles of Taxol Herceptin maintenance completed 07/07/2021  2.  Adjuvant radiation therapy 11/23/2020-12/19/2020 3.  Adjuvant antiestrogen therapy with tamoxifen started October 2022 4.  CT angiogram 07/13/2021: Right supraclavicular lymph node 5 cm, right internal mammary lymph nodes 1.8 cm 5.  Metastatic breast cancer: June 2023:Right supraclavicular lymphadenopathy: Biopsy: Metastatic poorly differentiated adenocarcinoma, ER +40%, PR negative, HER2 positive  3+ PET CT scan 08/06/21: Ext Right Supra clav LN along with Internal mammary and Rt Upper Mediastinal and Rt Axillary LN    CT chest abdomen pelvis 06/25/2022: Stable exam.  No new or progressive findings. CT CAP 10/04/2022: Stable findings without new or progressive disease CT CAP 02/22/2023: Stable findings without new or progressive disease.  Pneumonia that was noted on chest x-ray from earlier in the month was again noted. -----------------------------------------------------------------------------------------------------  Current treatment: Enhertu started on 08/21/2021 now every 4 weeks, today cycle 24 Enhertu toxicities: Tolerating it extremely well Echocardiogram every  4 months based upon Dr. Prescott Gum recommendation  Return to clinic in 4 weeks for labs and Enhertu ------------------------------------- Assessment and Plan    Pneumonia Recent CT scan showed a small infiltrate, possibly from pneumonia, and stable lymph nodes in the right axilla and mediastinum. Follow-up CT scan in March to monitor lung infiltrate and lymph nodes. If resolved, consider extending the interval between scans to four months and then six months. - Order follow-up CT scan in March to monitor lung infiltrate and lymph nodes  Fatigue Ongoing fatigue likely related to cancer treatments. Increased physical activity may help alleviate symptoms. - Encourage increased physical activity as tolerated  Cough and Ear Popping Persistent cough and ear popping since influenza. Headaches during coughing suggest possible post-viral syndrome. - Consider further evaluation if symptoms persist or worsen  Hypertension On antihypertensive medication and recently prescribed spironolactone to manage blood pressure and potassium levels. Spironolactone to be taken with current medication. - Continue current antihypertensive medication - Start spironolactone - Monitor blood pressure and potassium levels  Hypokalemia Potassium  level improved to 3.3 from 3.0. Receiving potassium infusions. Infusions may be discontinued once levels normalize. - Continue potassium infusions - Monitor potassium levels  General Health Maintenance Discussed need for work accommodations to avoid illness exposure, especially during flu season. Plan to get Moderna vaccine if approved by insurance. 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          Orders Placed This Encounter  Procedures   CT CHEST ABDOMEN PELVIS W CONTRAST    Standing Status:   Future    Expected Date:   06/03/2023    Expiration Date:   04/16/2024    If indicated for the ordered procedure, I authorize the administration of contrast media per Radiology protocol:   Yes    Does the patient have a contrast media/X-ray dye allergy?:   No    Is patient pregnant?:   No    Preferred imaging location?:   [facility]    If indicated for the ordered procedure, I authorize the administration of oral contrast media per Radiology protocol:   Yes   The patient has a good understanding of the overall plan. she agrees with it. she will call with any problems that may develop before the next visit here. Total time spent: 30 mins including face to face time and time spent for planning, charting and co-ordination of care   Tamsen Meek, MD 04/17/23

## 2023-04-17 NOTE — Assessment & Plan Note (Signed)
06/02/2020:Right lumpectomy ([facility]): invasive and in situ ductal carcinoma, 1.3cm, clear margins, 1 right axillary lymph node negative for carcinoma. ER 15% weak, PR-,HER-2 positive (3+) Ki67 80%.   Treatment plan: 1.  Adjuvant chemotherapy with Taxol Herceptin followed by Herceptin maintenance.  Stopped after 11 cycles of Taxol Herceptin maintenance completed 07/07/2021  2.  Adjuvant radiation therapy 11/23/2020-12/19/2020 3.  Adjuvant antiestrogen therapy with tamoxifen started October 2022 4.  CT angiogram 07/13/2021: Right supraclavicular lymph node 5 cm, right internal mammary lymph nodes 1.8 cm 5.  Metastatic breast cancer: June 2023:Right supraclavicular lymphadenopathy: Biopsy: Metastatic poorly differentiated adenocarcinoma, ER +40%, PR negative, HER2 positive 3+ PET CT scan 08/06/21: Ext Right Supra clav LN along with Internal mammary and Rt Upper Mediastinal and Rt Axillary LN    CT chest abdomen pelvis 06/25/2022: Stable exam.  No new or progressive findings. CT CAP 10/04/2022: Stable findings without new or progressive disease CT CAP 02/22/2023: Stable findings without new or progressive disease.  Pneumonia that was noted on chest x-ray from earlier in the month was again noted. -----------------------------------------------------------------------------------------------------  Current treatment: Enhertu started on 08/21/2021 now every 4 weeks, today cycle 24 Enhertu toxicities: Tolerating it extremely well Echocardiogram every 4 months based upon Dr. Prescott Gum recommendation  Return to clinic in 4 weeks for labs and Enhertu

## 2023-04-17 NOTE — Assessment & Plan Note (Deleted)
 06/02/2020:Right lumpectomy ([facility]): invasive and in situ ductal carcinoma, 1.3cm, clear margins, 1 right axillary lymph node negative for carcinoma. ER 15% weak, PR-,HER-2 positive (3+) Ki67 80%.   Treatment plan: 1.  Adjuvant chemotherapy with Taxol Herceptin followed by Herceptin maintenance.  Stopped after 11 cycles of Taxol Herceptin maintenance completed 07/07/2021  2.  Adjuvant radiation therapy 11/23/2020-12/19/2020 3.  Adjuvant antiestrogen therapy with tamoxifen started October 2022 4.  CT angiogram 07/13/2021: Right supraclavicular lymph node 5 cm, right internal mammary lymph nodes 1.8 cm 5.  Metastatic breast cancer: June 2023:Right supraclavicular lymphadenopathy: Biopsy: Metastatic poorly differentiated adenocarcinoma, ER +40%, PR negative, HER2 positive 3+ PET CT scan 08/06/21: Ext Right Supra clav LN along with Internal mammary and Rt Upper Mediastinal and Rt Axillary LN    CT chest abdomen pelvis 06/25/2022: Stable exam.  No new or progressive findings. CT CAP 10/04/2022: Stable findings without new or progressive disease CT CAP 02/22/2023: Stable findings without new or progressive disease.  Pneumonia that was noted on chest x-ray from earlier in the month was again noted. -----------------------------------------------------------------------------------------------------  Current treatment: Enhertu started on 08/21/2021 now every 4 weeks, today cycle 24 Enhertu toxicities: Tolerating it extremely well Echocardiogram every 4 months based upon Dr. Prescott Gum recommendation  Return to clinic in 4 weeks for labs and Enhertu

## 2023-04-17 NOTE — Patient Instructions (Signed)
[facility] - A DEPT OF [facility]  Discharge Instructions: Thank you for choosing [facility] to provide your oncology and hematology care.   If you have a lab appointment with the Cancer Center, please go directly to the Cancer Center and check in at the registration area.   Wear comfortable clothing and clothing appropriate for easy access to any Portacath or PICC line.   We strive to give you quality time with your provider. You may need to reschedule your appointment if you arrive late (15 or more minutes).  Arriving late affects you and other patients whose appointments are after yours.  Also, if you miss three or more appointments without notifying the office, you may be dismissed from the clinic at the provider's discretion.      For prescription refill requests, have your pharmacy contact our office and allow 72 hours for refills to be completed.    Today you received the following chemotherapy and/or immunotherapy agents Enhertu      To help prevent nausea and vomiting after your treatment, we encourage you to take your nausea medication as directed.  BELOW ARE SYMPTOMS THAT SHOULD BE REPORTED IMMEDIATELY: *FEVER GREATER THAN 100.4 F (38 C) OR HIGHER *CHILLS OR SWEATING *NAUSEA AND VOMITING THAT IS NOT CONTROLLED WITH YOUR NAUSEA MEDICATION *UNUSUAL SHORTNESS OF BREATH *UNUSUAL BRUISING OR BLEEDING *URINARY PROBLEMS (pain or burning when urinating, or frequent urination) *BOWEL PROBLEMS (unusual diarrhea, constipation, pain near the anus) TENDERNESS IN MOUTH AND THROAT WITH OR WITHOUT PRESENCE OF ULCERS (sore throat, sores in mouth, or a toothache) UNUSUAL RASH, SWELLING OR PAIN  UNUSUAL VAGINAL DISCHARGE OR ITCHING   Items with * indicate a potential emergency and should be followed up as soon as possible or go to the Emergency Department if any problems should occur.  Please show the CHEMOTHERAPY ALERT CARD or IMMUNOTHERAPY  ALERT CARD at check-in to the Emergency Department and triage nurse.  Should you have questions after your visit or need to cancel or reschedule your appointment, please contact [facility] - A DEPT OF [facility]  Dept: [phone]  and follow the prompts.  Office hours are 8:00 a.m. to 4:30 p.m. Monday - Friday. Please note that voicemails left after 4:00 p.m. may not be returned until the following business day.  We are closed weekends and major holidays. You have access to a nurse at all times for urgent questions. Please call the main number to the clinic Dept: [phone] and follow the prompts.   For any non-urgent questions, you may also contact your provider using MyChart. We now offer e-Visits for anyone 19 and older to request care online for non-urgent symptoms. For details visit [email or web address].   Also download the MyChart app! Go to the app store, search "MyChart", open the app, select [facility], and log in with your MyChart username and password.

## 2023-04-17 NOTE — Patient Instructions (Signed)

## 2023-04-23 ENCOUNTER — Telehealth: Payer: Self-pay

## 2023-04-23 NOTE — Telephone Encounter (Signed)
Ordering provider: D. Bensimhon Associated diagnoses: Obstructive Sleep Apnea WatchPAT PA obtained on 04/23/2023 by Brunetta Genera, LPN. Authorization: Yes; tracking ID  [id number] Patient notified of PIN (1234) on 04/23/2023 via Notification Method: phone.

## 2023-04-26 ENCOUNTER — Other Ambulatory Visit: Payer: 59

## 2023-04-26 ENCOUNTER — Ambulatory Visit: Payer: 59 | Admitting: Hematology and Oncology

## 2023-04-26 ENCOUNTER — Ambulatory Visit: Payer: 59

## 2023-04-29 ENCOUNTER — Emergency Department (HOSPITAL_COMMUNITY): Payer: 59

## 2023-04-29 ENCOUNTER — Inpatient Hospital Stay (HOSPITAL_COMMUNITY)
Admission: EM | Admit: 2023-04-29 | Discharge: 2023-05-04 | DRG: 194 | Disposition: A | Discharge status: Home / Self Care | Payer: 59 | Attending: Internal Medicine | Admitting: Internal Medicine

## 2023-04-29 ENCOUNTER — Other Ambulatory Visit: Payer: Self-pay

## 2023-04-29 ENCOUNTER — Encounter (HOSPITAL_COMMUNITY): Payer: Self-pay

## 2023-04-29 DIAGNOSIS — Z6841 Body Mass Index (BMI) 40.0 and over, adult: Secondary | ICD-10-CM

## 2023-04-29 DIAGNOSIS — Z9221 Personal history of antineoplastic chemotherapy: Secondary | ICD-10-CM

## 2023-04-29 DIAGNOSIS — D696 Thrombocytopenia, unspecified: Secondary | ICD-10-CM | POA: Diagnosis present

## 2023-04-29 DIAGNOSIS — R7989 Other specified abnormal findings of blood chemistry: Secondary | ICD-10-CM | POA: Diagnosis present

## 2023-04-29 DIAGNOSIS — E876 Hypokalemia: Secondary | ICD-10-CM | POA: Diagnosis not present

## 2023-04-29 DIAGNOSIS — D630 Anemia in neoplastic disease: Secondary | ICD-10-CM | POA: Diagnosis present

## 2023-04-29 DIAGNOSIS — C50511 Malignant neoplasm of lower-outer quadrant of right female breast: Secondary | ICD-10-CM | POA: Diagnosis present

## 2023-04-29 DIAGNOSIS — Z888 Allergy status to other drugs, medicaments and biological substances status: Secondary | ICD-10-CM

## 2023-04-29 DIAGNOSIS — C799 Secondary malignant neoplasm of unspecified site: Secondary | ICD-10-CM | POA: Diagnosis present

## 2023-04-29 DIAGNOSIS — I10 Essential (primary) hypertension: Secondary | ICD-10-CM | POA: Diagnosis present

## 2023-04-29 DIAGNOSIS — I110 Hypertensive heart disease with heart failure: Secondary | ICD-10-CM | POA: Diagnosis present

## 2023-04-29 DIAGNOSIS — I5032 Chronic diastolic (congestive) heart failure: Secondary | ICD-10-CM | POA: Diagnosis present

## 2023-04-29 DIAGNOSIS — J189 Pneumonia, unspecified organism: Principal | ICD-10-CM | POA: Diagnosis present

## 2023-04-29 DIAGNOSIS — Z79899 Other long term (current) drug therapy: Secondary | ICD-10-CM

## 2023-04-29 DIAGNOSIS — R9431 Abnormal electrocardiogram [ECG] [EKG]: Secondary | ICD-10-CM | POA: Diagnosis present

## 2023-04-29 DIAGNOSIS — J45909 Unspecified asthma, uncomplicated: Secondary | ICD-10-CM | POA: Diagnosis present

## 2023-04-29 DIAGNOSIS — Z170 Estrogen receptor positive status [ER+]: Secondary | ICD-10-CM

## 2023-04-29 DIAGNOSIS — Z885 Allergy status to narcotic agent status: Secondary | ICD-10-CM

## 2023-04-29 DIAGNOSIS — D649 Anemia, unspecified: Secondary | ICD-10-CM | POA: Diagnosis present

## 2023-04-29 DIAGNOSIS — Z7982 Long term (current) use of aspirin: Secondary | ICD-10-CM

## 2023-04-29 DIAGNOSIS — I5189 Other ill-defined heart diseases: Secondary | ICD-10-CM

## 2023-04-29 DIAGNOSIS — Z91040 Latex allergy status: Secondary | ICD-10-CM

## 2023-04-29 DIAGNOSIS — G4733 Obstructive sleep apnea (adult) (pediatric): Secondary | ICD-10-CM | POA: Diagnosis present

## 2023-04-29 DIAGNOSIS — Z975 Presence of (intrauterine) contraceptive device: Secondary | ICD-10-CM

## 2023-04-29 DIAGNOSIS — Z91013 Allergy to seafood: Secondary | ICD-10-CM

## 2023-04-29 DIAGNOSIS — Z8249 Family history of ischemic heart disease and other diseases of the circulatory system: Secondary | ICD-10-CM

## 2023-04-29 DIAGNOSIS — T380X5A Adverse effect of glucocorticoids and synthetic analogues, initial encounter: Secondary | ICD-10-CM | POA: Diagnosis present

## 2023-04-29 DIAGNOSIS — E782 Mixed hyperlipidemia: Secondary | ICD-10-CM | POA: Diagnosis present

## 2023-04-29 DIAGNOSIS — I2489 Other forms of acute ischemic heart disease: Secondary | ICD-10-CM | POA: Diagnosis present

## 2023-04-29 DIAGNOSIS — E66813 Obesity, class 3: Secondary | ICD-10-CM | POA: Diagnosis present

## 2023-04-29 DIAGNOSIS — Z833 Family history of diabetes mellitus: Secondary | ICD-10-CM

## 2023-04-29 DIAGNOSIS — Z803 Family history of malignant neoplasm of breast: Secondary | ICD-10-CM

## 2023-04-29 DIAGNOSIS — Z923 Personal history of irradiation: Secondary | ICD-10-CM

## 2023-04-29 DIAGNOSIS — R7303 Prediabetes: Secondary | ICD-10-CM | POA: Diagnosis present

## 2023-04-29 MED ORDER — IPRATROPIUM-ALBUTEROL 0.5-2.5 (3) MG/3ML IN SOLN
3.0000 mL | Freq: Once | RESPIRATORY_TRACT | Status: AC
  Administered 2023-04-29: 3 mL via RESPIRATORY_TRACT
  Filled 2023-04-29: qty 3

## 2023-04-29 MED ORDER — METHYLPREDNISOLONE SODIUM SUCC 125 MG IJ SOLR
125.0000 mg | Freq: Once | INTRAMUSCULAR | Status: AC
  Administered 2023-04-29: 125 mg via INTRAVENOUS
  Filled 2023-04-29: qty 2

## 2023-04-29 NOTE — ED Triage Notes (Signed)
 Patient BIB [facility] for ShOB that started two days ago.  Patient has been c/o wheezing, used her inhaler with no relief.  Patient's last chemotherapy was 2/12., she has been dx with PNA and the flu.  Patient received 10 of albuterol and 0.5 of atrovent en route with some relief.  EMS VS 98% on nebulizer, HR 104, BP 216/100

## 2023-04-29 NOTE — ED Triage Notes (Signed)
 Patient states her PNA and flu was in the Nov/Dec time frame

## 2023-04-29 NOTE — ED Provider Notes (Signed)
 [place] EMERGENCY DEPARTMENT AT [facility] Provider Note  CSN: [id number] Arrival date & time: 04/29/23 2156  Chief Complaint(s) Shortness of Breath  HPI Pamela [name] is a 58 y.o. female {Add pertinent medical, surgical, social history, OB history to HPI:1}    Shortness of Breath Severity:  Severe Onset quality:  Gradual Duration:  2 days Timing:  Intermittent Progression:  Waxing and waning Chronicity:  Recurrent Relieved by:  Inhaler Worsened by:  Exertion Associated symptoms: chest pain (tightness), cough (dry), headaches and wheezing     Past Medical History Past Medical History:  Diagnosis Date   Anxiety    Asthma    Back pain    Breast cancer (HCC)    Constipation    DUB (dysfunctional uterine bleeding) 2007   Edema of both lower extremities    Elevated cholesterol    Family history of breast cancer 05/12/2020   Family history of lung cancer 05/12/2020   Food allergy    H/O menorrhagia 05/01/2006   High cholesterol    History of ovarian cyst 2007   Hypertension 03/31/2005   Increased BMI 07/11/2006   Joint pain    Migraine    N&V (nausea and vomiting)    Obesity 2007   Obstructive sleep apnea syndrome, moderate 10/27/2013   no cpap   Personal history of chemotherapy    Personal history of radiation therapy    Sleep apnea    SOB (shortness of breath)    Vitamin D deficiency    Vitamin D deficiency    Weight loss 07/11/2006   Patient Active Problem List   Diagnosis Date Noted   Drug-induced constipation 11/24/2022   Chemotherapy induced cardiomyopathy (HCC) 05/08/2022   Aortic atherosclerosis (HCC) 04/26/2022   Acute cough 01/15/2022   Hypomagnesemia 11/26/2021   Hypokalemia 11/26/2021   Dizziness, fatigue, globus sensation 11/26/2021   Breast cancer (HCC) 08/25/2021   Nausea without vomiting 08/25/2021   Prediabetes 03/13/2021   Non-intractable vomiting 07/13/2020   Port-A-Cath in place 07/08/2020   Genetic testing  05/20/2020   Family history of breast cancer 05/12/2020   Family history of lung cancer 05/12/2020   Malignant neoplasm of lower-outer quadrant of right breast of female, estrogen receptor positive (HCC) 05/03/2020   Elevated troponin level not due myocardial infarction 09/18/2019   Mixed hyperlipidemia 09/18/2019   Hypertensive emergency 09/17/2019   Primary osteoarthritis of right hip 08/06/2018   Seasonal allergies 06/12/2018   Fibrocystic disease of breast 05/27/2018   Obstructive sleep apnea syndrome, moderate 10/27/2013   Shift work sleep disorder 10/27/2013   Psychic factors associated with diseases classified elsewhere 08/07/2013   Pure hypercholesterolemia 07/21/2013   Essential hypertension 07/21/2013   Snoring 07/21/2013   IUD contraception-inserted 10/01/11 10/01/2011   Class 3 severe obesity due to excess calories with serious comorbidity and body mass index (BMI) of 40.0 to 44.9 in adult [facility]) 09/03/2011   Home Medication(s) Prior to Admission medications   Medication Sig Start Date End Date Taking? Authorizing Provider  albuterol (VENTOLIN HFA) 108 (90 Base) MCG/ACT inhaler Inhale 2 puffs into the lungs every 6 (six) hours as needed for wheezing or shortness of breath. 03/07/23   Loa Socks, NP  aspirin EC 81 MG tablet Take 1 tablet (81 mg total) by mouth daily. Swallow whole. 09/18/19   Elgergawy, Leana Roe, MD  benzonatate (TESSALON) 100 MG capsule Take 1 capsule (100 mg total) by mouth 3 (three) times daily as needed for cough. 03/07/23   Lillard Anes  Cornetto, NP  chlorthalidone (HYGROTON) 25 MG tablet Take 1 tablet (25 mg total) by mouth daily. 01/08/23   Dorothyann Peng, MD  furosemide (LASIX) 20 MG tablet Take 1 tablet (20 mg total) by mouth as needed. TAKE 1 TABLET AS NEEDED FOR SWELLING OR SHORTNESS OF BREATH OR WEIGHT GAIN. 3 POUNDS WITHIN 24HR OR 5 POUNDS 7 DAYS 08/21/22 04/10/24  Bensimhon, Bevelyn Buckles, MD  levonorgestrel (MIRENA, 52 MG,) 20 MCG/DAY IUD 1 each  by Intrauterine route once.    [provider]  lidocaine-prilocaine (EMLA) cream Apply to affected area once AS DIRECTED 12/03/22   Serena Croissant, MD  potassium chloride SA (KLOR-CON M) 10 MEQ tablet Take 4 tablets (40 mEq total) by mouth daily. 08/21/22   Bensimhon, Bevelyn Buckles, MD  rosuvastatin (CRESTOR) 40 MG tablet Take 1 tablet (40 mg total) by mouth daily. 10/16/22   Bensimhon, Bevelyn Buckles, MD  Spacer/Aero-Holding Chambers (AEROCHAMBER PLUS) Device Please dispense spacer for inhaler use 03/07/23   Loa Socks, NP  spironolactone (ALDACTONE) 25 MG tablet Take 1 tablet (25 mg total) by mouth daily. 04/11/23   Bensimhon, Bevelyn Buckles, MD  valsartan (DIOVAN) 160 MG tablet Take 1 tablet (160 mg total) by mouth daily. 01/08/23 01/08/24  Dorothyann Peng, MD                                                                                                                                    Allergies Pollen extract, Compazine [prochlorperazine], Latex, Codeine, Diflucan [fluconazole], Shellfish allergy, and Betadine [povidone iodine]  Review of Systems Review of Systems  Respiratory:  Positive for cough (dry), shortness of breath and wheezing.   Cardiovascular:  Positive for chest pain (tightness).  Neurological:  Positive for headaches.   As noted in HPI  Physical Exam Vital Signs  I have reviewed the triage vital signs BP (!) 223/85 (BP Location: Left Arm)   Pulse 96   Temp 98.6 F (37 C) (Oral)   Resp (!) 22   Ht 5\' 6"  (1.676 m)   Wt (!) 139.7 kg   SpO2 100%   BMI 49.71 kg/m  *** Physical Exam Vitals reviewed.  Constitutional:      General: She is not in acute distress.    Appearance: She is well-developed. She is obese. She is not diaphoretic.  HENT:     Head: Normocephalic and atraumatic.     Nose: Nose normal.  Eyes:     General: No scleral icterus.       Right eye: No discharge.        Left eye: No discharge.     Conjunctiva/sclera: Conjunctivae normal.     Pupils:  Pupils are equal, round, and reactive to light.  Cardiovascular:     Rate and Rhythm: Normal rate and regular rhythm.     Heart sounds: No murmur heard.    No friction rub. No gallop.  Pulmonary:  Effort: Pulmonary effort is normal. No respiratory distress.     Breath sounds: No stridor. Wheezing (mild exp) present. No rales.  Abdominal:     General: There is no distension.     Palpations: Abdomen is soft.     Tenderness: There is no abdominal tenderness.  Musculoskeletal:        General: No tenderness.     Cervical back: Normal range of motion and neck supple.  Skin:    General: Skin is warm and dry.     Findings: No erythema or rash.  Neurological:     Mental Status: She is alert and oriented to person, place, and time.     ED Results and Treatments Labs (all labs ordered are listed, but only abnormal results are displayed) Labs Reviewed  CBC WITH DIFFERENTIAL/PLATELET  COMPREHENSIVE METABOLIC PANEL  BRAIN NATRIURETIC PEPTIDE  MAGNESIUM  TROPONIN I (HIGH SENSITIVITY)                                                                                                                         EKG  EKG Interpretation Date/Time:    Ventricular Rate:    PR Interval:    QRS Duration:    QT Interval:    QTC Calculation:   R Axis:      Text Interpretation:         Radiology No results found.  Medications Ordered in ED Medications - No data to display Procedures Procedures  (including critical care time) Medical Decision Making / ED Course   Medical Decision Making Amount and/or Complexity of Data Reviewed Labs: ordered. Radiology: ordered.    ***    Final Clinical Impression(s) / ED Diagnoses Final diagnoses:  None    This chart was dictated using voice recognition software.  Despite best efforts to proofread,  errors can occur which can change the documentation meaning.

## 2023-04-30 ENCOUNTER — Encounter (HOSPITAL_COMMUNITY): Payer: Self-pay | Admitting: Internal Medicine

## 2023-04-30 DIAGNOSIS — Z8249 Family history of ischemic heart disease and other diseases of the circulatory system: Secondary | ICD-10-CM | POA: Diagnosis not present

## 2023-04-30 DIAGNOSIS — Z6841 Body Mass Index (BMI) 40.0 and over, adult: Secondary | ICD-10-CM | POA: Diagnosis not present

## 2023-04-30 DIAGNOSIS — R7989 Other specified abnormal findings of blood chemistry: Secondary | ICD-10-CM | POA: Diagnosis present

## 2023-04-30 DIAGNOSIS — Z79899 Other long term (current) drug therapy: Secondary | ICD-10-CM | POA: Diagnosis not present

## 2023-04-30 DIAGNOSIS — I5032 Chronic diastolic (congestive) heart failure: Secondary | ICD-10-CM | POA: Diagnosis present

## 2023-04-30 DIAGNOSIS — J189 Pneumonia, unspecified organism: Secondary | ICD-10-CM | POA: Diagnosis present

## 2023-04-30 DIAGNOSIS — D630 Anemia in neoplastic disease: Secondary | ICD-10-CM | POA: Diagnosis present

## 2023-04-30 DIAGNOSIS — R9431 Abnormal electrocardiogram [ECG] [EKG]: Secondary | ICD-10-CM | POA: Diagnosis present

## 2023-04-30 DIAGNOSIS — C799 Secondary malignant neoplasm of unspecified site: Secondary | ICD-10-CM | POA: Diagnosis present

## 2023-04-30 DIAGNOSIS — G4733 Obstructive sleep apnea (adult) (pediatric): Secondary | ICD-10-CM | POA: Diagnosis present

## 2023-04-30 DIAGNOSIS — I5189 Other ill-defined heart diseases: Secondary | ICD-10-CM

## 2023-04-30 DIAGNOSIS — J45909 Unspecified asthma, uncomplicated: Secondary | ICD-10-CM | POA: Diagnosis present

## 2023-04-30 DIAGNOSIS — Z833 Family history of diabetes mellitus: Secondary | ICD-10-CM | POA: Diagnosis not present

## 2023-04-30 DIAGNOSIS — C50511 Malignant neoplasm of lower-outer quadrant of right female breast: Secondary | ICD-10-CM | POA: Diagnosis present

## 2023-04-30 DIAGNOSIS — E876 Hypokalemia: Secondary | ICD-10-CM | POA: Diagnosis present

## 2023-04-30 DIAGNOSIS — I110 Hypertensive heart disease with heart failure: Secondary | ICD-10-CM | POA: Diagnosis present

## 2023-04-30 DIAGNOSIS — D649 Anemia, unspecified: Secondary | ICD-10-CM | POA: Diagnosis present

## 2023-04-30 DIAGNOSIS — R7303 Prediabetes: Secondary | ICD-10-CM | POA: Diagnosis present

## 2023-04-30 DIAGNOSIS — T380X5A Adverse effect of glucocorticoids and synthetic analogues, initial encounter: Secondary | ICD-10-CM | POA: Diagnosis present

## 2023-04-30 DIAGNOSIS — D696 Thrombocytopenia, unspecified: Secondary | ICD-10-CM | POA: Diagnosis present

## 2023-04-30 DIAGNOSIS — E66813 Obesity, class 3: Secondary | ICD-10-CM | POA: Diagnosis present

## 2023-04-30 DIAGNOSIS — Z9221 Personal history of antineoplastic chemotherapy: Secondary | ICD-10-CM | POA: Diagnosis not present

## 2023-04-30 DIAGNOSIS — I2489 Other forms of acute ischemic heart disease: Secondary | ICD-10-CM | POA: Diagnosis present

## 2023-04-30 DIAGNOSIS — E782 Mixed hyperlipidemia: Secondary | ICD-10-CM | POA: Diagnosis present

## 2023-04-30 DIAGNOSIS — Z170 Estrogen receptor positive status [ER+]: Secondary | ICD-10-CM | POA: Diagnosis not present

## 2023-04-30 DIAGNOSIS — Z923 Personal history of irradiation: Secondary | ICD-10-CM | POA: Diagnosis not present

## 2023-04-30 DIAGNOSIS — Z7982 Long term (current) use of aspirin: Secondary | ICD-10-CM | POA: Diagnosis not present

## 2023-04-30 HISTORY — DX: Other ill-defined heart diseases: I51.89

## 2023-04-30 LAB — MAGNESIUM
Magnesium: 1.7 mg/dL (ref 1.7–2.4)
Magnesium: 1.8 mg/dL (ref 1.7–2.4)

## 2023-04-30 LAB — CBC
HCT: 36.5 % (ref 36.0–46.0)
Hemoglobin: 11.3 g/dL — ABNORMAL LOW (ref 12.0–15.0)
MCH: 27.5 pg (ref 26.0–34.0)
MCHC: 31 g/dL (ref 30.0–36.0)
MCV: 88.8 fL (ref 80.0–100.0)
Platelets: 140 10*3/uL — ABNORMAL LOW (ref 150–400)
RBC: 4.11 MIL/uL (ref 3.87–5.11)
RDW: 17.5 % — ABNORMAL HIGH (ref 11.5–15.5)
WBC: 5 10*3/uL (ref 4.0–10.5)
nRBC: 0 % (ref 0.0–0.2)

## 2023-04-30 LAB — COMPREHENSIVE METABOLIC PANEL
ALT: 27 U/L (ref 0–44)
AST: 46 U/L — ABNORMAL HIGH (ref 15–41)
Albumin: 3.2 g/dL — ABNORMAL LOW (ref 3.5–5.0)
Alkaline Phosphatase: 97 U/L (ref 38–126)
Anion gap: 9 (ref 5–15)
BUN: 8 mg/dL (ref 6–20)
CO2: 23 mmol/L (ref 22–32)
Calcium: 8.6 mg/dL — ABNORMAL LOW (ref 8.9–10.3)
Chloride: 105 mmol/L (ref 98–111)
Creatinine, Ser: 0.73 mg/dL (ref 0.44–1.00)
GFR, Estimated: >60 mL/min (ref >60)
Glucose, Bld: 126 mg/dL — ABNORMAL HIGH (ref 70–99)
Potassium: 2.6 mmol/L — CL (ref 3.5–5.1)
Sodium: 137 mmol/L (ref 135–145)
Total Bilirubin: 2.2 mg/dL — ABNORMAL HIGH (ref 0.0–1.2)
Total Protein: 6.6 g/dL (ref 6.5–8.1)

## 2023-04-30 LAB — CBC WITH DIFFERENTIAL/PLATELET
Abs Immature Granulocytes: 0.02 10*3/uL (ref 0.00–0.07)
Basophils Absolute: 0 10*3/uL (ref 0.0–0.1)
Basophils Relative: 1 %
Eosinophils Absolute: 0.3 10*3/uL (ref 0.0–0.5)
Eosinophils Relative: 6 %
HCT: 33.4 % — ABNORMAL LOW (ref 36.0–46.0)
Hemoglobin: 10.7 g/dL — ABNORMAL LOW (ref 12.0–15.0)
Immature Granulocytes: 0 %
Lymphocytes Relative: 13 %
Lymphs Abs: 0.7 10*3/uL (ref 0.7–4.0)
MCH: 27.8 pg (ref 26.0–34.0)
MCHC: 32 g/dL (ref 30.0–36.0)
MCV: 86.8 fL (ref 80.0–100.0)
Monocytes Absolute: 0.6 10*3/uL (ref 0.1–1.0)
Monocytes Relative: 12 %
Neutro Abs: 3.5 10*3/uL (ref 1.7–7.7)
Neutrophils Relative %: 68 %
Platelets: 140 10*3/uL — ABNORMAL LOW (ref 150–400)
RBC: 3.85 MIL/uL — ABNORMAL LOW (ref 3.87–5.11)
RDW: 17.3 % — ABNORMAL HIGH (ref 11.5–15.5)
WBC: 5.1 10*3/uL (ref 4.0–10.5)
nRBC: 0 % (ref 0.0–0.2)

## 2023-04-30 LAB — CBG MONITORING, ED
Glucose-Capillary: 189 mg/dL — ABNORMAL HIGH (ref 70–99)
Glucose-Capillary: 138 mg/dL — ABNORMAL HIGH (ref 70–99)

## 2023-04-30 LAB — BASIC METABOLIC PANEL
Anion gap: 12 (ref 5–15)
BUN: 8 mg/dL (ref 6–20)
CO2: 22 mmol/L (ref 22–32)
Calcium: 9 mg/dL (ref 8.9–10.3)
Chloride: 108 mmol/L (ref 98–111)
Creatinine, Ser: 0.68 mg/dL (ref 0.44–1.00)
GFR, Estimated: >60 mL/min (ref >60)
Glucose, Bld: 182 mg/dL — ABNORMAL HIGH (ref 70–99)
Potassium: 3.5 mmol/L (ref 3.5–5.1)
Sodium: 142 mmol/L (ref 135–145)

## 2023-04-30 LAB — GLUCOSE, CAPILLARY: Glucose-Capillary: 115 mg/dL — ABNORMAL HIGH (ref 70–99)

## 2023-04-30 LAB — TROPONIN I (HIGH SENSITIVITY)
Troponin I (High Sensitivity): 53 ng/L — ABNORMAL HIGH (ref <18)
Troponin I (High Sensitivity): 77 ng/L — ABNORMAL HIGH (ref <18)

## 2023-04-30 LAB — RESP PANEL BY RT-PCR (RSV, FLU A&B, COVID)  RVPGX2
Influenza A by PCR: NEGATIVE
Influenza B by PCR: NEGATIVE
Resp Syncytial Virus by PCR: NEGATIVE
SARS Coronavirus 2 by RT PCR: NEGATIVE

## 2023-04-30 LAB — BRAIN NATRIURETIC PEPTIDE: B Natriuretic Peptide: 51.5 pg/mL (ref 0.0–100.0)

## 2023-04-30 LAB — PHOSPHORUS: Phosphorus: 2.4 mg/dL — ABNORMAL LOW (ref 2.5–4.6)

## 2023-04-30 LAB — HIV ANTIBODY (ROUTINE TESTING W REFLEX): HIV Screen 4th Generation wRfx: NONREACTIVE

## 2023-04-30 MED ORDER — MELATONIN 5 MG PO TABS
5.0000 mg | ORAL_TABLET | Freq: Every evening | ORAL | Status: DC | PRN
  Administered 2023-05-01 – 2023-05-03 (×3): 5 mg via ORAL
  Filled 2023-04-30 (×3): qty 1

## 2023-04-30 MED ORDER — ONDANSETRON HCL 4 MG/2ML IJ SOLN: 4.0000 mg | Freq: Four times a day (QID) | INTRAMUSCULAR | Status: DC | PRN

## 2023-04-30 MED ORDER — IRBESARTAN 75 MG PO TABS
75.0000 mg | ORAL_TABLET | Freq: Every day | ORAL | Status: DC
  Administered 2023-04-30 – 2023-05-02 (×3): 75 mg via ORAL
  Filled 2023-04-30 (×3): qty 1

## 2023-04-30 MED ORDER — CHLORHEXIDINE GLUCONATE CLOTH 2 % EX PADS
6.0000 | MEDICATED_PAD | Freq: Every day | CUTANEOUS | Status: DC
  Administered 2023-04-30 – 2023-05-04 (×4): 6 via TOPICAL

## 2023-04-30 MED ORDER — AMLODIPINE BESYLATE 5 MG PO TABS
5.0000 mg | ORAL_TABLET | Freq: Every day | ORAL | Status: DC
  Administered 2023-04-30 – 2023-05-01 (×2): 5 mg via ORAL
  Filled 2023-04-30 (×2): qty 1

## 2023-04-30 MED ORDER — POTASSIUM CHLORIDE CRYS ER 20 MEQ PO TBCR
20.0000 meq | EXTENDED_RELEASE_TABLET | Freq: Two times a day (BID) | ORAL | Status: DC
  Administered 2023-04-30: 20 meq via ORAL
  Filled 2023-04-30: qty 1

## 2023-04-30 MED ORDER — LABETALOL HCL 5 MG/ML IV SOLN
20.0000 mg | Freq: Once | INTRAVENOUS | Status: AC
  Administered 2023-04-30: 20 mg via INTRAVENOUS
  Filled 2023-04-30: qty 4

## 2023-04-30 MED ORDER — K PHOS MONO-SOD PHOS DI & MONO 155-852-130 MG PO TABS
500.0000 mg | ORAL_TABLET | Freq: Three times a day (TID) | ORAL | Status: DC
Start: 2023-04-30 — End: 2023-04-30

## 2023-04-30 MED ORDER — POLYETHYLENE GLYCOL 3350 17 G PO PACK: 17.0000 g | PACK | Freq: Every day | ORAL | Status: DC | PRN

## 2023-04-30 MED ORDER — ACETAMINOPHEN 325 MG PO TABS
650.0000 mg | ORAL_TABLET | Freq: Four times a day (QID) | ORAL | Status: DC | PRN
  Administered 2023-04-30: 650 mg via ORAL
  Filled 2023-04-30: qty 2

## 2023-04-30 MED ORDER — ENOXAPARIN SODIUM 40 MG/0.4ML IJ SOSY: 40.0000 mg | PREFILLED_SYRINGE | INTRAMUSCULAR | Status: DC

## 2023-04-30 MED ORDER — PREDNISONE 20 MG PO TABS
40.0000 mg | ORAL_TABLET | Freq: Every day | ORAL | Status: AC
  Administered 2023-04-30 – 2023-05-04 (×5): 40 mg via ORAL
  Filled 2023-04-30 (×5): qty 2

## 2023-04-30 MED ORDER — POTASSIUM CHLORIDE CRYS ER 20 MEQ PO TBCR
40.0000 meq | EXTENDED_RELEASE_TABLET | Freq: Once | ORAL | Status: AC
  Administered 2023-04-30: 40 meq via ORAL
  Filled 2023-04-30: qty 2

## 2023-04-30 MED ORDER — IPRATROPIUM-ALBUTEROL 0.5-2.5 (3) MG/3ML IN SOLN: 3.0000 mL | RESPIRATORY_TRACT | Status: DC | PRN

## 2023-04-30 MED ORDER — SODIUM CHLORIDE 0.9 % IV SOLN
2.0000 g | INTRAVENOUS | Status: AC
  Administered 2023-04-30 – 2023-05-03 (×4): 2 g via INTRAVENOUS
  Filled 2023-04-30 (×4): qty 20

## 2023-04-30 MED ORDER — ACETAMINOPHEN 500 MG PO TABS
1000.0000 mg | ORAL_TABLET | Freq: Once | ORAL | Status: AC
  Administered 2023-04-30: 1000 mg via ORAL
  Filled 2023-04-30: qty 2

## 2023-04-30 MED ORDER — ONDANSETRON HCL 4 MG PO TABS
4.0000 mg | ORAL_TABLET | Freq: Four times a day (QID) | ORAL | Status: DC | PRN
Start: 2023-04-30 — End: 2023-05-04

## 2023-04-30 MED ORDER — POTASSIUM CHLORIDE 10 MEQ/100ML IV SOLN
10.0000 meq | INTRAVENOUS | Status: AC
Start: 2023-04-30 — End: 2023-04-30
  Administered 2023-04-30 (×2): 10 meq via INTRAVENOUS
  Filled 2023-04-30 (×2): qty 100

## 2023-04-30 MED ORDER — SODIUM CHLORIDE 0.9% FLUSH: 10.0000 mL | INTRAVENOUS | Status: DC | PRN

## 2023-04-30 MED ORDER — K PHOS MONO-SOD PHOS DI & MONO 155-852-130 MG PO TABS
500.0000 mg | ORAL_TABLET | Freq: Three times a day (TID) | ORAL | Status: AC
  Administered 2023-04-30 (×2): 500 mg via ORAL
  Filled 2023-04-30 (×2): qty 2

## 2023-04-30 MED ORDER — SODIUM CHLORIDE 0.9 % IV SOLN
500.0000 mg | Freq: Once | INTRAVENOUS | Status: AC
  Administered 2023-04-30: 500 mg via INTRAVENOUS
  Filled 2023-04-30: qty 5

## 2023-04-30 MED ORDER — MAGNESIUM SULFATE 2 GM/50ML IV SOLN
2.0000 g | Freq: Once | INTRAVENOUS | Status: AC
  Administered 2023-04-30: 2 g via INTRAVENOUS
  Filled 2023-04-30: qty 50

## 2023-04-30 MED ORDER — ACETAMINOPHEN 325 MG PO TABS
650.0000 mg | ORAL_TABLET | Freq: Four times a day (QID) | ORAL | Status: DC | PRN
  Administered 2023-04-30 – 2023-05-04 (×6): 650 mg via ORAL
  Filled 2023-04-30 (×7): qty 2

## 2023-04-30 MED ORDER — INSULIN ASPART 100 UNIT/ML IJ SOLN
0.0000 [IU] | Freq: Three times a day (TID) | INTRAMUSCULAR | Status: DC
  Administered 2023-04-30: 4 [IU] via SUBCUTANEOUS
  Administered 2023-04-30 – 2023-05-01 (×3): 3 [IU] via SUBCUTANEOUS
  Filled 2023-04-30: qty 0.2

## 2023-04-30 MED ORDER — SODIUM CHLORIDE 0.9 % IV SOLN
1.0000 g | Freq: Once | INTRAVENOUS | Status: AC
  Administered 2023-04-30: 1 g via INTRAVENOUS
  Filled 2023-04-30: qty 10

## 2023-04-30 MED ORDER — MAGNESIUM OXIDE -MG SUPPLEMENT 400 (240 MG) MG PO TABS
800.0000 mg | ORAL_TABLET | Freq: Once | ORAL | Status: AC
  Administered 2023-04-30: 800 mg via ORAL
  Filled 2023-04-30: qty 2

## 2023-04-30 MED ORDER — SPIRONOLACTONE 25 MG PO TABS
25.0000 mg | ORAL_TABLET | Freq: Every day | ORAL | Status: DC
  Administered 2023-04-30 – 2023-05-04 (×5): 25 mg via ORAL
  Filled 2023-04-30 (×5): qty 1

## 2023-04-30 MED ORDER — SODIUM CHLORIDE 0.9% FLUSH
10.0000 mL | Freq: Two times a day (BID) | INTRAVENOUS | Status: DC
  Administered 2023-04-30 – 2023-05-03 (×6): 10 mL

## 2023-04-30 MED ORDER — ENOXAPARIN SODIUM 80 MG/0.8ML IJ SOSY
70.0000 mg | PREFILLED_SYRINGE | INTRAMUSCULAR | Status: DC
  Filled 2023-04-30: qty 0.7

## 2023-04-30 MED ORDER — ACETAMINOPHEN 650 MG RE SUPP: 650.0000 mg | Freq: Four times a day (QID) | RECTAL | Status: DC | PRN

## 2023-04-30 MED ORDER — SODIUM CHLORIDE 0.9 % IV SOLN
500.0000 mg | INTRAVENOUS | Status: AC
  Administered 2023-04-30 – 2023-05-03 (×4): 500 mg via INTRAVENOUS
  Filled 2023-04-30 (×4): qty 5

## 2023-04-30 MED ORDER — POTASSIUM CHLORIDE CRYS ER 20 MEQ PO TBCR
20.0000 meq | EXTENDED_RELEASE_TABLET | Freq: Two times a day (BID) | ORAL | Status: AC
  Administered 2023-05-01: 20 meq via ORAL
  Filled 2023-04-30: qty 1

## 2023-04-30 MED ORDER — ENOXAPARIN SODIUM 80 MG/0.8ML IJ SOSY
70.0000 mg | PREFILLED_SYRINGE | INTRAMUSCULAR | Status: DC
Start: 2023-04-30 — End: 2023-05-04
  Administered 2023-04-30 – 2023-05-03 (×4): 70 mg via SUBCUTANEOUS
  Filled 2023-04-30: qty 0.7
  Filled 2023-04-30 (×3): qty 0.8

## 2023-04-30 NOTE — H&P (Signed)
 History and Physical    Patient: Pamela [name] WUJ:[id number] DOB: 07-21-1965 DOA: 04/29/2023 DOS: the patient was seen and examined on 04/30/2023 PCP: Dorothyann Peng, MD  Patient coming from: Home  Chief Complaint:  Chief Complaint  Patient presents with   Shortness of Breath   HPI: Pamela [name] is a 58 y.o. female with medical history significant of anxiety, asthma, back pain, breast cancer, constipation, DUB/menorrhagia, or [place] edema, hyperlipidemia, hypertension, class III obesity, migraine, osteoarthritis, obstructive sleep apnea not on CPAP, vitamin D deficiency, estrogen receptor positive breast cancer following Dr. Pamelia Hoit who presented to the emergency department with complaints of progressively worse dyspnea associated with wheezing, productive cough, fatigue, malaise and headache after being diagnosed with influenza recently. He denied fever, chills, rhinorrhea, sore throat, wheezing or hemoptysis.  No  palpitations, diaphoresis, PND, orthopnea, but occasionally has pitting edema of the lower extremities.  No abdominal pain, nausea, emesis, diarrhea, constipation, melena or hematochezia.  No flank pain, dysuria, frequency or hematuria.  No polyuria, polydipsia, polyphagia or blurred vision.   ED course: Initial vital signs were temperature 98.6 F, pulse 96, respiration 22, BP 223/85 mmHg O2 sat 100% on room air.  The patient received acetaminophen 1000 mg p.o., magnesium oxide 800 mg p.o., K-Lor 40 mEq p.o. x 1, DuoNeb, methylprednisolone 125 mg IVP, KCl 10 mEq IVPB x 2, azithromycin 500 mg IVPB and ceftriaxone 1 g IVPB.  Lab work: CBC showed white count 5.1, hemoglobin 10.7 g/dL platelets 956.  Troponin was 53 then 77 ng/L.  BNP 51.5 pg/mL.  Coronavirus, influenza and RSV PCR negative.  CMP showed a potassium of 2.6 mmol/L, the rest of the electrolytes and renal function were normal after calcium correction.  Glucose 126 and total bilirubin 2.2 mg/dL.  Albumin was 3.2 g/dL  and AST 46 units/L.  Normal total protein, ALT and alk phos.  Magnesium was 1.7, and 1.8 mg/dL.  Glucose this morning was 182 mg/dL.  Imaging: Portable 1 view chest radiograph showing mild cardiomegaly with vascular congestion.  Right infrahilar airspace opacity could reflect asymmetric edema or infection.   Review of Systems: As mentioned in the history of present illness. All other systems reviewed and are negative. Past Medical History:  Diagnosis Date   Anxiety    Asthma    Back pain    Breast cancer (HCC)    Constipation    DUB (dysfunctional uterine bleeding) 2007   Edema of both lower extremities    Elevated cholesterol    Family history of breast cancer 05/12/2020   Family history of lung cancer 05/12/2020   Food allergy    H/O menorrhagia 05/01/2006   High cholesterol    History of ovarian cyst 2007   Hypertension 03/31/2005   Increased BMI 07/11/2006   Joint pain    Migraine    N&V (nausea and vomiting)    Obesity 2007   Obstructive sleep apnea syndrome, moderate 10/27/2013   no cpap   Personal history of chemotherapy    Personal history of radiation therapy    Sleep apnea    SOB (shortness of breath)    Vitamin D deficiency    Vitamin D deficiency    Weight loss 07/11/2006   Past Surgical History:  Procedure Laterality Date   BREAST LUMPECTOMY     BREAST LUMPECTOMY WITH RADIOACTIVE SEED AND SENTINEL LYMPH NODE BIOPSY Right 06/02/2020   Procedure: RIGHT BREAST LUMPECTOMY WITH RADIOACTIVE SEED AND RIGHT SENTINEL LYMPH NODE BIOPSY;  Surgeon: Harriette Bouillon, MD;  Location: [facility];  Service: General;  Laterality: Right;   BREAST REDUCTION SURGERY  05/04/1991   CESAREAN SECTION     HYSTEROSCOPY  05/01/2006   PORTACATH PLACEMENT Right 06/02/2020   Procedure: INSERTION PORT-A-CATH;  Surgeon: Harriette Bouillon, MD;  Location: [facility];  Service: General;  Laterality: Right;   REDUCTION MAMMAPLASTY     Social History:  reports that  she has never smoked. She has never used smokeless tobacco. She reports that she does not currently use drugs. Frequency: 2.00 times per week. She reports that she does not drink alcohol.  Allergies  Allergen Reactions   Pollen Extract Shortness Of Breath   Compazine [Prochlorperazine] Anxiety    IV compazine causes severe anxiety attack   Latex Rash   Codeine Hives   Diflucan [Fluconazole] Hives   Shellfish Allergy Hives   Betadine [Povidone Iodine] Rash    Family History  Problem Relation Age of Onset   Hypertension Mother    High Cholesterol Mother    Thyroid disease Mother    Cancer Mother    Sleep apnea Mother    Hypertension Father    Heart attack Father    Sudden death Father    Diabetes Maternal Grandmother    Bone cancer Maternal Grandfather        dx after 43   Breast cancer Maternal Aunt        dx 30s   Lung cancer Maternal Aunt        dx after 50    Prior to Admission medications   Medication Sig Start Date End Date Taking? Authorizing Provider  albuterol (VENTOLIN HFA) 108 (90 Base) MCG/ACT inhaler Inhale 2 puffs into the lungs every 6 (six) hours as needed for wheezing or shortness of breath. 03/07/23  Yes Causey, Larna Daughters, NP  aspirin EC 81 MG tablet Take 1 tablet (81 mg total) by mouth daily. Swallow whole. 09/18/19  Yes Elgergawy, Leana Roe, MD  benzonatate (TESSALON) 100 MG capsule Take 1 capsule (100 mg total) by mouth 3 (three) times daily as needed for cough. 03/07/23  Yes Causey, Larna Daughters, NP  chlorthalidone (HYGROTON) 25 MG tablet Take 1 tablet (25 mg total) by mouth daily. 01/08/23  Yes Dorothyann Peng, MD  furosemide (LASIX) 20 MG tablet Take 1 tablet (20 mg total) by mouth as needed. TAKE 1 TABLET AS NEEDED FOR SWELLING OR SHORTNESS OF BREATH OR WEIGHT GAIN. 3 POUNDS WITHIN 24HR OR 5 POUNDS 7 DAYS 08/21/22 04/10/24 Yes Bensimhon, Bevelyn Buckles, MD  levonorgestrel (MIRENA, 52 MG,) 20 MCG/DAY IUD 1 each by Intrauterine route once.   Yes [provider]  potassium chloride (MICRO-K) 10 MEQ CR capsule Take 40 mEq by mouth daily. 12/03/22  Yes [provider]  rosuvastatin (CRESTOR) 40 MG tablet Take 1 tablet (40 mg total) by mouth daily. 10/16/22  Yes Bensimhon, Bevelyn Buckles, MD  spironolactone (ALDACTONE) 25 MG tablet Take 1 tablet (25 mg total) by mouth daily. 04/11/23  Yes Bensimhon, Bevelyn Buckles, MD  valsartan (DIOVAN) 160 MG tablet Take 1 tablet (160 mg total) by mouth daily. 01/08/23 01/08/24 Yes Dorothyann Peng, MD  venlafaxine XR (EFFEXOR-XR) 37.5 MG 24 hr capsule Take 37.5 mg by mouth every morning. 12/03/22  Yes [provider]  lidocaine-prilocaine (EMLA) cream Apply to affected area once AS DIRECTED 12/03/22   Serena Croissant, MD  Spacer/Aero-Holding Chambers (AEROCHAMBER PLUS) Device Please dispense spacer for inhaler use 03/07/23   Loa Socks, NP  Physical Exam: Vitals:   04/29/23 2214 04/29/23 2330 04/30/23 0030 04/30/23 0335  BP:  (!) 182/90 (!) 173/75 (!) 178/84  Pulse:  95 94 96  Resp:  18 18 (!) 23  Temp:   98.7 F (37.1 C) 98 F (36.7 C)  TempSrc:      SpO2:  99% 100% 98%  Weight: (!) 139.7 kg     Height: 5\' 6"  (1.676 m)      Physical Exam Vitals and nursing note reviewed.  Constitutional:      General: She is awake. She is not in acute distress.    Appearance: She is well-developed. She is ill-appearing.     Interventions: Nasal cannula in place.  HENT:     Head: Normocephalic.     Nose: No rhinorrhea.     Mouth/Throat:     Mouth: Mucous membranes are dry.  Eyes:     General: No scleral icterus.    Pupils: Pupils are equal, round, and reactive to light.  Neck:     Vascular: No JVD.  Cardiovascular:     Rate and Rhythm: Normal rate and regular rhythm.     Heart sounds: S1 normal and S2 normal.  Pulmonary:     Effort: Pulmonary effort is normal. No tachypnea or respiratory distress.     Breath sounds: Decreased breath sounds, wheezing, rhonchi and rales present.  Abdominal:      General: Bowel sounds are normal. There is no distension.     Palpations: Abdomen is soft.     Tenderness: There is no abdominal tenderness. There is no guarding.  Musculoskeletal:     Cervical back: Neck supple.     Right lower leg: No edema.     Left lower leg: No edema.  Skin:    General: Skin is warm and dry.  Neurological:     General: No focal deficit present.     Mental Status: She is alert and oriented to person, place, and time.  Psychiatric:        Mood and Affect: Mood normal.        Behavior: Behavior normal. Behavior is cooperative.     Data Reviewed:  Results are pending, will review when available. 03/28/2022 echocardiogram report. IMPRESSIONS:   1. Left ventricular ejection fraction, by estimation, is 55 to 60%. The  left ventricle has normal function. The left ventricle has no regional  wall motion abnormalities. There is moderate concentric left ventricular  hypertrophy. Left ventricular  diastolic parameters are consistent with Grade I diastolic dysfunction  (impaired relaxation). The average left ventricular global longitudinal  strain is -18.0 %. The global longitudinal strain is normal.   2. Right ventricular systolic function is normal. The right ventricular  size is normal.   3. The mitral valve is normal in structure. Trivial mitral valve  regurgitation. No evidence of mitral stenosis.   4. The aortic valve is tricuspid. There is mild calcification of the  aortic valve. Aortic valve regurgitation is not visualized. No aortic  stenosis is present.   5. The inferior vena cava is normal in size with greater than 50%  respiratory variability, suggesting right atrial pressure of 3 mmHg.   04/11/2023 echocardiogram report. IMPRESSIONS:   1. Left ventricular ejection fraction, by estimation, is 55 to 60%. The  left ventricle has normal function. The left ventricle has no regional  wall motion abnormalities. There is mild concentric left ventricular   hypertrophy. Left ventricular diastolic  parameters are indeterminate.   2.  Right ventricular systolic function is normal. The right ventricular  size is normal.   3. The mitral valve is normal in structure. Trivial mitral valve  regurgitation. No evidence of mitral stenosis.   4. The aortic valve is tricuspid. Aortic valve regurgitation is not  visualized. Aortic valve sclerosis is present, with no evidence of aortic  valve stenosis.   5. The inferior vena cava is normal in size with greater than 50%  respiratory variability, suggesting right atrial pressure of 3 mmHg.   EKG: Vent. rate 90 BPM PR interval 208 ms QRS duration 166 ms QT/QTcB 442/541 ms P-R-T axes 91 98 71 Sinus rhythm Borderline prolonged PR interval Anterior infarct, old Prolonged QT interval  Assessment and Plan: Principal Problem:   CAP (community acquired pneumonia) Admit to telemetry/inpatient. Continue supplemental oxygen. Scheduled and as needed bronchodilators. Continue ceftriaxone 1 g IVPB daily. Continue azithromycin 500 mg IVPB daily. Check strep pneumoniae urinary antigen. Check sputum Gram stain, culture and sensitivity. Follow-up blood culture and sensitivity. Follow-up CBC and chemistry in the morning.  Active Problems:   Essential hypertension Continue valsartan 160 mg p.o. daily. Begin amlodipine 5 mg p.o. x 1 dose.    Grade I diastolic dysfunction No signs of volume overload.    Elevated troponin Likely demand ischemia. Had recent echocardiogram.    Prediabetes Carbohydrate modified diet. CBG monitoring with RI SS. Check hemoglobin A1c.    Hypokalemia Replacing. Follow-up potassium level.    Hypophosphatemia Replacement ordered. Follow-up level as needed.    Thrombocytopenia (HCC) Monitor platelet count.    Normocytic anemia In the setting of malignancy/chemo. Monitor hematocrit and hemoglobin. Transfuse as needed.    Prolonged QT interval Avoid QT prolonging  meds as possible. KCl supplementation. Magnesium sulfate 2 g IVPB now. Keep electrolytes optimized. Check EKG in the morning.    Class 3 severe obesity Current BMI 49.71 kg/m. Would benefit significantly from lifestyle modifications. Follow-up with PCP and/or bariatric clinic.    Mixed hyperlipidemia Not on medical therapy. Would benefit from lifestyle modifications. Follow-up with PCP.    Obstructive sleep apnea syndrome, moderate CPAP at bedtime while in the hospital.    Malignant neoplasm of lower-outer quadrant of  right breast of female, estrogen receptor positive (HCC) Follow-up with Dr. Pamelia Hoit as scheduled at the cancer center.   Advance Care Planning:   Code Status: Full Code   Consults:   Family Communication:   Severity of Illness: The appropriate patient status for this patient is INPATIENT. Inpatient status is judged to be reasonable and necessary in order to provide the required intensity of service to ensure the patient's safety. The patient's presenting symptoms, physical exam findings, and initial radiographic and laboratory data in the context of their chronic comorbidities is felt to place them at high risk for further clinical deterioration. Furthermore, it is not anticipated that the patient will be medically stable for discharge from the hospital within 2 midnights of admission.   * I certify that at the point of admission it is my clinical judgment that the patient will require inpatient hospital care spanning beyond 2 midnights from the point of admission due to high intensity of service, high risk for further deterioration and high frequency of surveillance required.*  Author: Bobette Mo, MD 04/30/2023 7:54 AM  For on call review [email or web address].   This document was prepared using Dragon voice recognition software and may contain some unintended transcription errors.

## 2023-04-30 NOTE — ED Notes (Signed)
 Pt ambulatory to bathroom without difficulty.

## 2023-04-30 NOTE — Progress Notes (Signed)
   04/30/23 2354  BiPAP/CPAP/SIPAP  BiPAP/CPAP/SIPAP Pt Type Adult  Reason BIPAP/CPAP not in use Other(comment) (PT said she will try to get hers for tommarow)

## 2023-05-01 ENCOUNTER — Other Ambulatory Visit: Payer: Self-pay | Admitting: *Deleted

## 2023-05-01 DIAGNOSIS — J189 Pneumonia, unspecified organism: Secondary | ICD-10-CM | POA: Diagnosis not present

## 2023-05-01 LAB — CBC
HCT: 31.8 % — ABNORMAL LOW (ref 36.0–46.0)
Hemoglobin: 10 g/dL — ABNORMAL LOW (ref 12.0–15.0)
MCH: 27.5 pg (ref 26.0–34.0)
MCHC: 31.4 g/dL (ref 30.0–36.0)
MCV: 87.6 fL (ref 80.0–100.0)
Platelets: 185 10*3/uL (ref 150–400)
RBC: 3.63 MIL/uL — ABNORMAL LOW (ref 3.87–5.11)
RDW: 17.9 % — ABNORMAL HIGH (ref 11.5–15.5)
WBC: 12.7 10*3/uL — ABNORMAL HIGH (ref 4.0–10.5)
nRBC: 0 % (ref 0.0–0.2)

## 2023-05-01 LAB — COMPREHENSIVE METABOLIC PANEL
ALT: 34 U/L (ref 0–44)
AST: 57 U/L — ABNORMAL HIGH (ref 15–41)
Albumin: 2.8 g/dL — ABNORMAL LOW (ref 3.5–5.0)
Alkaline Phosphatase: 88 U/L (ref 38–126)
Anion gap: 7 (ref 5–15)
BUN: 11 mg/dL (ref 6–20)
CO2: 25 mmol/L (ref 22–32)
Calcium: 8.6 mg/dL — ABNORMAL LOW (ref 8.9–10.3)
Chloride: 108 mmol/L (ref 98–111)
Creatinine, Ser: 0.62 mg/dL (ref 0.44–1.00)
GFR, Estimated: >60 mL/min (ref >60)
Glucose, Bld: 108 mg/dL — ABNORMAL HIGH (ref 70–99)
Potassium: 3.9 mmol/L (ref 3.5–5.1)
Sodium: 140 mmol/L (ref 135–145)
Total Bilirubin: 1.6 mg/dL — ABNORMAL HIGH (ref 0.0–1.2)
Total Protein: 5.9 g/dL — ABNORMAL LOW (ref 6.5–8.1)

## 2023-05-01 LAB — GLUCOSE, CAPILLARY
Glucose-Capillary: 119 mg/dL — ABNORMAL HIGH (ref 70–99)
Glucose-Capillary: 131 mg/dL — ABNORMAL HIGH (ref 70–99)
Glucose-Capillary: 148 mg/dL — ABNORMAL HIGH (ref 70–99)
Glucose-Capillary: 96 mg/dL (ref 70–99)

## 2023-05-01 LAB — PROCALCITONIN: Procalcitonin: <0.1 ng/mL

## 2023-05-01 MED ORDER — ASPIRIN 81 MG PO TBEC
81.0000 mg | DELAYED_RELEASE_TABLET | Freq: Every day | ORAL | Status: DC
  Administered 2023-05-01 – 2023-05-04 (×4): 81 mg via ORAL
  Filled 2023-05-01 (×4): qty 1

## 2023-05-01 MED ORDER — CHLORTHALIDONE 25 MG PO TABS
25.0000 mg | ORAL_TABLET | Freq: Every day | ORAL | Status: DC
  Administered 2023-05-01 – 2023-05-04 (×4): 25 mg via ORAL
  Filled 2023-05-01 (×4): qty 1

## 2023-05-01 NOTE — Progress Notes (Signed)
   05/01/23 1207  TOC Brief Assessment  Insurance and Status Reviewed  Patient has primary care physician Yes  Home environment has been reviewed [address]  [place] [address]  Prior level of function: independent  Prior/Current Home Services No current home services  Social Drivers of Health Review SDOH reviewed no interventions necessary  Readmission risk has been reviewed Yes  Transition of care needs no transition of care needs at this time

## 2023-05-01 NOTE — Progress Notes (Signed)
   05/01/23 2036  BiPAP/CPAP/SIPAP  BiPAP/CPAP/SIPAP Pt Type Adult  Reason BIPAP/CPAP not in use Non-compliant (Pt refusing cpap for the night)

## 2023-05-01 NOTE — Progress Notes (Signed)
 PROGRESS NOTE  [name]:[id number] DOB: 07/10/1965 DOA: 04/29/2023 PCP: Dorothyann Peng, MD  HPI/Recap of past 24 hours: Pamela [name] is a 58 y.o. female with medical history significant of asthma, hyperlipidemia, hypertension, class III obesity, migraine, obstructive sleep apnea not on CPAP, estrogen receptor positive breast cancer following Dr. Pamelia Hoit who presented to the ED with complaints of progressively worse dyspnea associated with wheezing, productive cough, fatigue, malaise and headache after being diagnosed with influenza recently. In the ED, VS stable except for BP 223/85 mmHg. Lab work: Coronavirus, influenza and RSV PCR negative.  Portable 1 view chest radiograph showing mild cardiomegaly with vascular congestion.  Right infrahilar airspace opacity could reflect asymmetric edema or infection.  Patient admitted for further management.    Today, patient reports some improvement, but still reports wheezing with overall fatigue.  Denies any new complaints.    Assessment/Plan: Principal Problem:   CAP (community acquired pneumonia) Active Problems:   Class 3 severe obesity due to excess calories with serious comorbidity and body mass index (BMI) of 40.0 to 44.9 in adult [facility])   Essential hypertension   Obstructive sleep apnea syndrome, moderate   Mixed hyperlipidemia   Malignant neoplasm of lower-outer quadrant of right breast of female, estrogen receptor positive (HCC)   Prediabetes   Hypokalemia   Hypophosphatemia   Thrombocytopenia (HCC)   Elevated troponin   Normocytic anemia   Grade I diastolic dysfunction   Prolonged QT interval   CAP (community acquired pneumonia) History of asthma Currently afebrile, with reactive leukocytosis likely from steroid use Strep pneumo urine antigen pending collection Sputum culture pending collection Scheduled and as needed bronchodilators. Continue ceftriaxone, azithromycin  Continue prednisone Supplemental O2  as needed   Essential hypertension BP improving Continue valsartan, chlorthalidone, Aldactone   Chronic diastolic HF BNP 51 Echo on 04/11/23 showed EF of 55 to 60%, no regional wall motion abnormality, mild, concentric left ventricular hypertrophy No signs of volume overload   Elevated troponin Likely demand ischemia Echo as above   Prediabetes A1c pending SSI, Accu-Cheks, hypoglycemic protocol   Normocytic anemia In the setting of malignancy/chemo. Daily CBC  Transaminitis Ongoing, likely 2/2 malignancy/chemo Follow-up   Prolonged QT interval Avoid QT prolonging meds as possible. Keep electrolytes optimized   Class 3 severe obesity Lifestyle modification advised   Obstructive sleep apnea syndrome, moderate CPAP at bedtime while in the hospital.   Malignant neoplasm of lower-outer quadrant of  right breast of female, estrogen receptor positive (HCC) Follow-up with Dr. Pamelia Hoit as scheduled at the cancer center.     Estimated body mass index is 53.91 kg/m as calculated from the following:   Height as of this encounter: 5\' 6"  (1.676 m).   Weight as of this encounter: 151.5 kg.     Code Status: Full  Family Communication: None at bedside  Disposition Plan: Status is: Inpatient Remains inpatient appropriate because: Level of care      Consultants: None  Procedures: None  Antimicrobials: Rocephin, azithromycin  DVT prophylaxis: Lovenox   Objective: Vitals:   05/01/23 0455 05/01/23 0709 05/01/23 0733 05/01/23 1329  BP: (!) 145/71  136/69 (!) 140/72  Pulse: 73  73 64  Resp: 14  20 20   Temp: 98.1 F (36.7 C)  98.3 F (36.8 C) 97.8 F (36.6 C)  TempSrc: Oral  Oral Oral  SpO2: 100%  98% 99%  Weight:  (!) 151.5 kg    Height:        Intake/Output Summary (Last 24 hours)  at 05/01/2023 1454 Last data filed at 05/01/2023 1610 Gross per 24 hour  Intake 350 ml  Output --  Net 350 ml   Filed Weights   04/29/23 2214 04/30/23 2018 05/01/23 0709   Weight: (!) 139.7 kg (!) 148.4 kg (!) 151.5 kg    Exam: General: NAD  Cardiovascular: S1, S2 present Respiratory: CTAB, noted mild bilateral expiratory wheezing Abdomen: Soft, nontender, nondistended, bowel sounds present Musculoskeletal: No bilateral pedal edema noted Skin: Normal Psychiatry: Normal mood     Data Reviewed: CBC: Recent Labs  Lab 04/29/23 2340 04/30/23 0602 05/01/23 0508  WBC 5.1 5.0 12.7*  NEUTROABS 3.5  --   --   HGB 10.7* 11.3* 10.0*  HCT 33.4* 36.5 31.8*  MCV 86.8 88.8 87.6  PLT 140* 140* 185   Basic Metabolic Panel: Recent Labs  Lab 04/29/23 2340 04/30/23 0602 05/01/23 0508  NA 137 142 140  K 2.6* 3.5 3.9  CL 105 108 108  CO2 23 22 25   GLUCOSE 126* 182* 108*  BUN 8 8 11   CREATININE 0.73 0.68 0.62  CALCIUM 8.6* 9.0 8.6*  MG 1.7 1.8  --   PHOS  --  2.4*  --    GFR: Estimated Creatinine Clearance: 119.2 mL/min (by C-G formula based on SCr of 0.62 mg/dL). Liver Function Tests: Recent Labs  Lab 04/29/23 2340 05/01/23 0508  AST 46* 57*  ALT 27 34  ALKPHOS 97 88  BILITOT 2.2* 1.6*  PROT 6.6 5.9*  ALBUMIN 3.2* 2.8*   No results for input(s): "LIPASE", "AMYLASE" in the last 168 hours. No results for input(s): "AMMONIA" in the last 168 hours. Coagulation Profile: No results for input(s): "INR", "PROTIME" in the last 168 hours. Cardiac Enzymes: No results for input(s): "CKTOTAL", "CKMB", "CKMBINDEX", "TROPONINI" in the last 168 hours. BNP (last 3 results) No results for input(s): "PROBNP" in the last 8760 hours. HbA1C: No results for input(s): "HGBA1C" in the last 72 hours. CBG: Recent Labs  Lab 04/30/23 1118 04/30/23 1625 04/30/23 2102 05/01/23 0735 05/01/23 1127  GLUCAP 189* 138* 115* 119* 131*   Lipid Profile: No results for input(s): "CHOL", "HDL", "LDLCALC", "TRIG", "CHOLHDL", "LDLDIRECT" in the last 72 hours. Thyroid Function Tests: No results for input(s): "TSH", "T4TOTAL", "FREET4", "T3FREE", "THYROIDAB" in the last  72 hours. Anemia Panel: No results for input(s): "VITAMINB12", "FOLATE", "FERRITIN", "TIBC", "IRON", "RETICCTPCT" in the last 72 hours. Urine analysis:    Component Value Date/Time   COLORURINE YELLOW 02/07/2023 1511   APPEARANCEUR HAZY (A) 02/07/2023 1511   LABSPEC 1.016 02/07/2023 1511   PHURINE 6.0 02/07/2023 1511   GLUCOSEU NEGATIVE 02/07/2023 1511   HGBUR MODERATE (A) 02/07/2023 1511   BILIRUBINUR NEGATIVE 02/07/2023 1511   BILIRUBINUR negative 07/11/2022 1022   KETONESUR NEGATIVE 02/07/2023 1511   PROTEINUR NEGATIVE 02/07/2023 1511   UROBILINOGEN 1.0 07/11/2022 1022   UROBILINOGEN 0.2 05/11/2010 1640   NITRITE NEGATIVE 02/07/2023 1511   LEUKOCYTESUR NEGATIVE 02/07/2023 1511   Sepsis Labs: @LABRCNTIP (procalcitonin:4,lacticidven:4)  ) Recent Results (from the past 240 hours)  Resp panel by RT-PCR (RSV, Flu A&B, Covid) Anterior Nasal Swab     Status: None   Collection Time: 04/30/23  2:15 AM   Specimen: Anterior Nasal Swab  Result Value Ref Range Status   SARS Coronavirus 2 by RT PCR NEGATIVE NEGATIVE Final    Comment: (NOTE) SARS-CoV-2 target nucleic acids are NOT DETECTED.  The SARS-CoV-2 RNA is generally detectable in upper respiratory specimens during the acute phase of infection. The lowest concentration of  SARS-CoV-2 viral copies this assay can detect is 138 copies/mL. A negative result does not preclude SARS-Cov-2 infection and should not be used as the sole basis for treatment or other patient management decisions. A negative result may occur with  improper specimen collection/handling, submission of specimen other than nasopharyngeal swab, presence of viral mutation(s) within the areas targeted by this assay, and inadequate number of viral copies(<138 copies/mL). A negative result must be combined with clinical observations, patient history, and epidemiological information. The expected result is Negative.  Fact Sheet for Patients:   BloggerCourse.com  Fact Sheet for Healthcare Providers:  SeriousBroker.it  This test is no t yet approved or cleared by the [place] FDA and  has been authorized for detection and/or diagnosis of SARS-CoV-2 by FDA under an Emergency Use Authorization (EUA). This EUA will remain  in effect (meaning this test can be used) for the duration of the COVID-19 declaration under Section 564(b)(1) of the Act, 21 U.S.C.section 360bbb-3(b)(1), unless the authorization is terminated  or revoked sooner.       Influenza A by PCR NEGATIVE NEGATIVE Final   Influenza B by PCR NEGATIVE NEGATIVE Final    Comment: (NOTE) The Xpert Xpress SARS-CoV-2/FLU/RSV plus assay is intended as an aid in the diagnosis of influenza from Nasopharyngeal swab specimens and should not be used as a sole basis for treatment. Nasal washings and aspirates are unacceptable for Xpert Xpress SARS-CoV-2/FLU/RSV testing.  Fact Sheet for Patients: BloggerCourse.com  Fact Sheet for Healthcare Providers: SeriousBroker.it  This test is not yet approved or cleared by the [place] FDA and has been authorized for detection and/or diagnosis of SARS-CoV-2 by FDA under an Emergency Use Authorization (EUA). This EUA will remain in effect (meaning this test can be used) for the duration of the COVID-19 declaration under Section 564(b)(1) of the Act, 21 U.S.C. section 360bbb-3(b)(1), unless the authorization is terminated or revoked.     Resp Syncytial Virus by PCR NEGATIVE NEGATIVE Final    Comment: (NOTE) Fact Sheet for Patients: BloggerCourse.com  Fact Sheet for Healthcare Providers: SeriousBroker.it  This test is not yet approved or cleared by the [place] FDA and has been authorized for detection and/or diagnosis of SARS-CoV-2 by FDA under an Emergency Use  Authorization (EUA). This EUA will remain in effect (meaning this test can be used) for the duration of the COVID-19 declaration under Section 564(b)(1) of the Act, 21 U.S.C. section 360bbb-3(b)(1), unless the authorization is terminated or revoked.  Performed at [facility], [address], [place], [place] [address]       Studies: No results found.  Scheduled Meds:  amLODipine  5 mg Oral Daily   Chlorhexidine Gluconate Cloth  6 each Topical Daily   enoxaparin (LOVENOX) injection  70 mg Subcutaneous Q24H   insulin aspart  0-20 Units Subcutaneous TID WC   irbesartan  75 mg Oral Daily   predniSONE  40 mg Oral Q breakfast   sodium chloride flush  10-40 mL Intracatheter Q12H   spironolactone  25 mg Oral Daily    Continuous Infusions:  azithromycin Stopped (04/30/23 2328)   cefTRIAXone (ROCEPHIN)  IV Stopped (04/30/23 2140)     LOS: 1 day     Briant Cedar, MD [facility]  If 7PM-7AM, please contact night-coverage [email or web address] 05/01/2023, 2:54 PM

## 2023-05-01 NOTE — Plan of Care (Signed)

## 2023-05-02 ENCOUNTER — Ambulatory Visit: Payer: 59 | Admitting: Hematology and Oncology

## 2023-05-02 ENCOUNTER — Encounter: Payer: Self-pay | Admitting: *Deleted

## 2023-05-02 ENCOUNTER — Other Ambulatory Visit: Payer: 59

## 2023-05-02 ENCOUNTER — Inpatient Hospital Stay (HOSPITAL_COMMUNITY): Payer: 59

## 2023-05-02 ENCOUNTER — Ambulatory Visit: Payer: 59

## 2023-05-02 DIAGNOSIS — C50511 Malignant neoplasm of lower-outer quadrant of right female breast: Secondary | ICD-10-CM | POA: Diagnosis not present

## 2023-05-02 DIAGNOSIS — Z170 Estrogen receptor positive status [ER+]: Secondary | ICD-10-CM

## 2023-05-02 DIAGNOSIS — J189 Pneumonia, unspecified organism: Secondary | ICD-10-CM | POA: Diagnosis not present

## 2023-05-02 LAB — BASIC METABOLIC PANEL
Anion gap: 5 (ref 5–15)
BUN: 12 mg/dL (ref 6–20)
CO2: 26 mmol/L (ref 22–32)
Calcium: 8.2 mg/dL — ABNORMAL LOW (ref 8.9–10.3)
Chloride: 106 mmol/L (ref 98–111)
Creatinine, Ser: 0.54 mg/dL (ref 0.44–1.00)
GFR, Estimated: >60 mL/min (ref >60)
Glucose, Bld: 85 mg/dL (ref 70–99)
Potassium: 3.3 mmol/L — ABNORMAL LOW (ref 3.5–5.1)
Sodium: 137 mmol/L (ref 135–145)

## 2023-05-02 LAB — CBC WITH DIFFERENTIAL/PLATELET
Abs Immature Granulocytes: 0.05 K/uL (ref 0.00–0.07)
Basophils Absolute: 0 K/uL (ref 0.0–0.1)
Basophils Relative: 0 %
Eosinophils Absolute: 0.2 K/uL (ref 0.0–0.5)
Eosinophils Relative: 2 %
HCT: 31.3 % — ABNORMAL LOW (ref 36.0–46.0)
Hemoglobin: 9.8 g/dL — ABNORMAL LOW (ref 12.0–15.0)
Immature Granulocytes: 1 %
Lymphocytes Relative: 14 %
Lymphs Abs: 1.3 K/uL (ref 0.7–4.0)
MCH: 27.9 pg (ref 26.0–34.0)
MCHC: 31.3 g/dL (ref 30.0–36.0)
MCV: 89.2 fL (ref 80.0–100.0)
Monocytes Absolute: 0.9 K/uL (ref 0.1–1.0)
Monocytes Relative: 9 %
Neutro Abs: 6.8 K/uL (ref 1.7–7.7)
Neutrophils Relative %: 74 %
Platelets: 152 K/uL (ref 150–400)
RBC: 3.51 MIL/uL — ABNORMAL LOW (ref 3.87–5.11)
RDW: 18.3 % — ABNORMAL HIGH (ref 11.5–15.5)
WBC: 9.2 K/uL (ref 4.0–10.5)
nRBC: 0 % (ref 0.0–0.2)

## 2023-05-02 LAB — GLUCOSE, CAPILLARY
Glucose-Capillary: 78 mg/dL (ref 70–99)
Glucose-Capillary: 79 mg/dL (ref 70–99)
Glucose-Capillary: 121 mg/dL — ABNORMAL HIGH (ref 70–99)
Glucose-Capillary: 124 mg/dL — ABNORMAL HIGH (ref 70–99)

## 2023-05-02 LAB — STREP PNEUMONIAE URINARY ANTIGEN: Strep Pneumo Urinary Antigen: NEGATIVE

## 2023-05-02 LAB — HEMOGLOBIN A1C
Hgb A1c MFr Bld: 5.4 % (ref 4.8–5.6)
Mean Plasma Glucose: 108 mg/dL

## 2023-05-02 MED ORDER — FUROSEMIDE 20 MG PO TABS
20.0000 mg | ORAL_TABLET | Freq: Every day | ORAL | Status: DC
  Administered 2023-05-02 – 2023-05-04 (×3): 20 mg via ORAL
  Filled 2023-05-02 (×3): qty 1

## 2023-05-02 MED ORDER — POTASSIUM CHLORIDE CRYS ER 20 MEQ PO TBCR
40.0000 meq | EXTENDED_RELEASE_TABLET | Freq: Once | ORAL | Status: AC
  Administered 2023-05-02: 40 meq via ORAL
  Filled 2023-05-02: qty 2

## 2023-05-02 MED ORDER — POTASSIUM CHLORIDE CRYS ER 20 MEQ PO TBCR
20.0000 meq | EXTENDED_RELEASE_TABLET | Freq: Every day | ORAL | Status: DC
  Administered 2023-05-03: 20 meq via ORAL
  Filled 2023-05-02: qty 1

## 2023-05-02 MED ORDER — INSULIN ASPART 100 UNIT/ML IJ SOLN
0.0000 [IU] | Freq: Three times a day (TID) | INTRAMUSCULAR | Status: DC
  Administered 2023-05-02: 2 [IU] via SUBCUTANEOUS

## 2023-05-02 NOTE — Progress Notes (Signed)
   05/02/23 2201  BiPAP/CPAP/SIPAP  BiPAP/CPAP/SIPAP Pt Type Adult  Reason BIPAP/CPAP not in use Non-compliant (Patient continues to refuse nocturnal cpap.)

## 2023-05-02 NOTE — Progress Notes (Signed)
 HEMATOLOGY-ONCOLOGY PROGRESS NOTE  SUBJECTIVE: Patient has been admitted with pneumonia after presenting to the hospital with complaints of progressively worsening dyspnea.  Chest x-ray showed right infrahilar airspace opacity she has been treated with antibiotics.  She is starting to feel significantly better.  Oncology History  Malignant neoplasm of lower-outer quadrant of right breast of female, estrogen receptor positive (HCC)  05/03/2020 Initial Diagnosis   Screening mammogram showed a 1.0cm mass at the 6 o'clock position in the right breast. Diagnostic mammogram and US showed the 1.2cm mass at the 6 o'clock position in the right breast. Biopsy showed IDC, grade 3, HER-2 positive (3+), ER 15% weak, PR-, Ki67 80%.   05/19/2020 Genetic Testing   Negative hereditary cancer genetic testing: no pathogenic variants detected in [facility] Breast STAT Panel and Common Hereditary Cancers Panel.  The report dates are May 19, 2020 (STAT) and May 23, 2020 (Common Hereditary).   The Common Hereditary Cancers Panel offered by [facility] includes sequencing and/or deletion duplication testing of the following 47 genes: APC, ATM, AXIN2, BARD1, BMPR1A, BRCA1, BRCA2, BRIP1, CDH1, CDK4, CDKN2A (p14ARF), CDKN2A (p16INK4a), CHEK2, CTNNA1, DICER1, EPCAM (Deletion/duplication testing only), GREM1 (promoter region deletion/duplication testing only), GREM1, HOXB13, KIT, MEN1, MLH1, MSH2, MSH3, MSH6, MUTYH, NBN, NF1, NHTL1, PALB2, PDGFRA, PMS2, POLD1, POLE, PTEN, RAD50, RAD51C, RAD51D, SDHA, SDHB, SDHC, SDHD, SMAD4, SMARCA4. STK11, TP53, TSC1, TSC2, and VHL.  The following genes were evaluated for sequence changes only: SDHA and HOXB13 c.251G>A variant only.es only: SDHA and HOXB13 c.251G>A variant only.   06/02/2020 Surgery   Right lumpectomy ([facility]): invasive and in situ ductal carcinoma, 1.3cm, clear margins, 1 right axillary lymph node negative for carcinoma.   06/02/2020 Cancer Staging   Staging form: Breast, AJCC 8th  Edition - Pathologic stage from 06/02/2020: Stage IA (pT1c, pN0, cM0, G3, ER+, PR-, HER2+) - Signed by Loa Socks, NP on 06/15/2020 Stage prefix: Initial diagnosis Histologic grading system: 3 grade system   07/08/2020 - 10/21/2020 Chemotherapy   Taxol Herceptin followed by Herceptin maintenance   04/14/2021 -  Anti-estrogen oral therapy   Tamoxifen 10 mg   07/13/2021 Imaging   CT angiogram: Right supraclavicular lymph node 5 cm, right internal mammary lymph nodes 1.8 cm    08/06/2021 PET scan   PET CT scan: Ext Right Supra clav LN along with Internal mammary and Rt Upper Mediastinal and Rt Axillary LN   08/2021 Initial Biopsy   Right supraclavicular lymphadenopathy: Biopsy: Metastatic poorly differentiated adenocarcinoma, ER +40%, PR negative, HER2 positive 3+    08/21/2021 -  Chemotherapy   Enhertu every 3 weeks    03/08/2022 Imaging   CT chest/abdomen/pelvis  1. No new or progressive findings in the chest, abdomen or pelvis. 2. Signs of RIGHT breast lumpectomy with areas of central fat necrosis unchanged from previous imaging. 3. Stable small lymph nodes throughout the chest, largest with area of soft tissue about surgical clips in the RIGHT axilla. 4. Stable appearance of thoracic inlet lymph nodes, not shown to be hypermetabolic on prior PET imaging. 5. IUD in-situ. 6. Aortic atherosclerosis.   08/21/2022 Imaging   Echocardiogram  -normal LVEF at 60-65%. -no left ventricular wall motion abnormalities  -normal left ventricular diastolic function  -mild left ventricular hypertrophy.    10/05/2022 Imaging   CT Chest, Abdomen, and Pelvis with contrast IMPRESSION: 1. Stable examination without new or progressive finding in the chest, abdomen or pelvis to suggest recurrent or metastatic disease. 2. Stable tiny bilateral thoracic inlet lymph nodes. 3.  Aortic  Atherosclerosis (ICD10-I70.0).     OBJECTIVE: REVIEW OF SYSTEMS:   Constitutional: Denies fevers, chills or  abnormal weight loss All other systems were reviewed with the patient and are negative.  I have reviewed the past medical history, past surgical history, social history and family history with the patient and they are unchanged from previous note.   PHYSICAL EXAMINATION: ECOG PERFORMANCE STATUS: 1 - Symptomatic but completely ambulatory  Vitals:   05/02/23 0438 05/02/23 1110  BP: (!) 147/72 (!) 147/72  Pulse: (!) 53 (!) 57  Resp: 14 18  Temp: 98.2 F (36.8 C) 98.1 F (36.7 C)  SpO2: 100% 100%   Filed Weights   04/30/23 2018 05/01/23 0709 05/02/23 0438  Weight: (!) 327 lb 2.6 oz (148.4 kg) (!) 334 lb (151.5 kg) (!) 336 lb 3.2 oz (152.5 kg)    GENERAL:alert, no distress and comfortable  NEURO: alert & oriented x 3 with fluent speech, no focal motor/sensory deficits  LABORATORY DATA:  I have reviewed the data as listed    Latest Ref Rng & Units 05/02/2023    4:44 AM 05/01/2023    5:08 AM 04/30/2023    6:02 AM  CMP  Glucose 70 - 99 mg/dL 85  098  119   BUN 6 - 20 mg/dL 12  11  8    Creatinine 0.44 - 1.00 mg/dL 1.47  8.29  5.62   Sodium 135 - 145 mmol/L 137  140  142   Potassium 3.5 - 5.1 mmol/L 3.3  3.9  3.5   Chloride 98 - 111 mmol/L 106  108  108   CO2 22 - 32 mmol/L 26  25  22    Calcium 8.9 - 10.3 mg/dL 8.2  8.6  9.0   Total Protein 6.5 - 8.1 g/dL  5.9    Total Bilirubin 0.0 - 1.2 mg/dL  1.6    Alkaline Phos 38 - 126 U/L  88    AST 15 - 41 U/L  57    ALT 0 - 44 U/L  34      Lab Results  Component Value Date   WBC 9.2 05/02/2023   HGB 9.8 (L) 05/02/2023   HCT 31.3 (L) 05/02/2023   MCV 89.2 05/02/2023   PLT 152 05/02/2023   NEUTROABS 6.8 05/02/2023    ASSESSMENT AND PLAN: 1.  Shortness of breath/pneumonia: Being treated with antibiotics 2. metastatic breast cancer: On Enhertu 3.  Hypertension 4.  hyperglycemia: Secondary to steroids Next treatment will be in 2 weeks.

## 2023-05-02 NOTE — Progress Notes (Signed)
 PROGRESS NOTE  [name]:[id number] DOB: December 23, 1965 DOA: 04/29/2023 PCP: Dorothyann Peng, MD  HPI/Recap of past 24 hours: Pamela [name] is a 58 y.o. female with medical history significant of asthma, hyperlipidemia, hypertension, class III obesity, migraine, obstructive sleep apnea not on CPAP, estrogen receptor positive breast cancer following Dr. Pamelia Hoit who presented to the ED with complaints of progressively worse dyspnea associated with wheezing, productive cough, fatigue, malaise and headache after being diagnosed with influenza recently. In the ED, VS stable except for BP 223/85 mmHg. Lab work: Coronavirus, influenza and RSV PCR negative.  Portable 1 view chest radiograph showing mild cardiomegaly with vascular congestion.  Right infrahilar airspace opacity could reflect asymmetric edema or infection.  Patient admitted for further management.     Today, pt still reporting some mild SOB, noted very mild wheezing. Plan to repeat CXR to assess volume status     Assessment/Plan: Principal Problem:   CAP (community acquired pneumonia) Active Problems:   Class 3 severe obesity due to excess calories with serious comorbidity and body mass index (BMI) of 40.0 to 44.9 in adult [facility])   Essential hypertension   Obstructive sleep apnea syndrome, moderate   Mixed hyperlipidemia   Malignant neoplasm of lower-outer quadrant of right breast of female, estrogen receptor positive (HCC)   Prediabetes   Hypokalemia   Hypophosphatemia   Thrombocytopenia (HCC)   Elevated troponin   Normocytic anemia   Grade I diastolic dysfunction   Prolonged QT interval   CAP (community acquired pneumonia) History of asthma Currently afebrile, with reactive leukocytosis likely from steroid use Strep pneumo urine antigen negative Sputum culture pending collection Procalcitonin negative Chest x-ray with right infrahilar airspace opacity as well as mild cardiomegaly vascular congestion, repeat  pending Scheduled and as needed bronchodilators. Continue ceftriaxone, azithromycin  Continue prednisone Supplemental O2 as needed   Essential hypertension BP improving Continue valsartan, chlorthalidone, Aldactone  Hypokalemia Replaced as needed   Chronic diastolic HF BNP 51 (falsely low and obesity) Echo on 04/11/23 showed EF of 55 to 60%, no regional wall motion abnormality, mild, concentric left ventricular hypertrophy Repeat chest x-ray pending Start home Lasix 20 mg daily, monitor closely, may need higher dose if no significant improvement or if chest x-ray still shows persistent congestion  Elevated troponin Likely demand ischemia Echo as above   Prediabetes A1c 5.4 SSI, Accu-Cheks, hypoglycemic protocol   Normocytic anemia In the setting of malignancy/chemo. Daily CBC  Transaminitis Ongoing, likely 2/2 malignancy/chemo Follow-up   Prolonged QT interval Avoid QT prolonging meds as possible. Keep electrolytes optimized   Class 3 severe obesity Lifestyle modification advised   Obstructive sleep apnea syndrome, moderate CPAP at bedtime while in the hospital.   Malignant neoplasm of lower-outer quadrant of  right breast of female, estrogen receptor positive (HCC) Follow-up with Dr. Pamelia Hoit as scheduled at the cancer center.     Estimated body mass index is 54.26 kg/m as calculated from the following:   Height as of this encounter: 5\' 6"  (1.676 m).   Weight as of this encounter: 152.5 kg.     Code Status: Full  Family Communication: None at bedside  Disposition Plan: Status is: Inpatient Remains inpatient appropriate because: Level of care      Consultants: None  Procedures: None  Antimicrobials: Rocephin, azithromycin  DVT prophylaxis: Lovenox   Objective: Vitals:   05/01/23 1329 05/01/23 2109 05/02/23 0438 05/02/23 1110  BP: (!) 140/72 (!) 157/73 (!) 147/72 (!) 147/72  Pulse: 64 (!) 58 (!) 53 Marland Kitchen)  57  Resp: 20 14 14 18   Temp: 97.8  F (36.6 C) 98.3 F (36.8 C) 98.2 F (36.8 C) 98.1 F (36.7 C)  TempSrc: Oral Oral Oral Oral  SpO2: 99% 100% 100% 100%  Weight:   (!) 152.5 kg   Height:        Intake/Output Summary (Last 24 hours) at 05/02/2023 1551 Last data filed at 05/02/2023 1610 Gross per 24 hour  Intake 600 ml  Output --  Net 600 ml   Filed Weights   04/30/23 2018 05/01/23 0709 05/02/23 0438  Weight: (!) 148.4 kg (!) 151.5 kg (!) 152.5 kg    Exam: General: NAD  Cardiovascular: S1, S2 present Respiratory: CTAB, noted mild bilateral expiratory wheezing Abdomen: Soft, nontender, nondistended, bowel sounds present Musculoskeletal: No bilateral pedal edema noted Skin: Normal Psychiatry: Normal mood     Data Reviewed: CBC: Recent Labs  Lab 04/29/23 2340 04/30/23 0602 05/01/23 0508 05/02/23 0444  WBC 5.1 5.0 12.7* 9.2  NEUTROABS 3.5  --   --  6.8  HGB 10.7* 11.3* 10.0* 9.8*  HCT 33.4* 36.5 31.8* 31.3*  MCV 86.8 88.8 87.6 89.2  PLT 140* 140* 185 152   Basic Metabolic Panel: Recent Labs  Lab 04/29/23 2340 04/30/23 0602 05/01/23 0508 05/02/23 0444  NA 137 142 140 137  K 2.6* 3.5 3.9 3.3*  CL 105 108 108 106  CO2 23 22 25 26   GLUCOSE 126* 182* 108* 85  BUN 8 8 11 12   CREATININE 0.73 0.68 0.62 0.54  CALCIUM 8.6* 9.0 8.6* 8.2*  MG 1.7 1.8  --   --   PHOS  --  2.4*  --   --    GFR: Estimated Creatinine Clearance: 119.7 mL/min (by C-G formula based on SCr of 0.54 mg/dL). Liver Function Tests: Recent Labs  Lab 04/29/23 2340 05/01/23 0508  AST 46* 57*  ALT 27 34  ALKPHOS 97 88  BILITOT 2.2* 1.6*  PROT 6.6 5.9*  ALBUMIN 3.2* 2.8*   No results for input(s): "LIPASE", "AMYLASE" in the last 168 hours. No results for input(s): "AMMONIA" in the last 168 hours. Coagulation Profile: No results for input(s): "INR", "PROTIME" in the last 168 hours. Cardiac Enzymes: No results for input(s): "CKTOTAL", "CKMB", "CKMBINDEX", "TROPONINI" in the last 168 hours. BNP (last 3 results) No  results for input(s): "PROBNP" in the last 8760 hours. HbA1C: Recent Labs    05/01/23 0509  HGBA1C 5.4   CBG: Recent Labs  Lab 05/01/23 1127 05/01/23 1639 05/01/23 2111 05/02/23 0723 05/02/23 1107  GLUCAP 131* 148* 96 78 79   Lipid Profile: No results for input(s): "CHOL", "HDL", "LDLCALC", "TRIG", "CHOLHDL", "LDLDIRECT" in the last 72 hours. Thyroid Function Tests: No results for input(s): "TSH", "T4TOTAL", "FREET4", "T3FREE", "THYROIDAB" in the last 72 hours. Anemia Panel: No results for input(s): "VITAMINB12", "FOLATE", "FERRITIN", "TIBC", "IRON", "RETICCTPCT" in the last 72 hours. Urine analysis:    Component Value Date/Time   COLORURINE YELLOW 02/07/2023 1511   APPEARANCEUR HAZY (A) 02/07/2023 1511   LABSPEC 1.016 02/07/2023 1511   PHURINE 6.0 02/07/2023 1511   GLUCOSEU NEGATIVE 02/07/2023 1511   HGBUR MODERATE (A) 02/07/2023 1511   BILIRUBINUR NEGATIVE 02/07/2023 1511   BILIRUBINUR negative 07/11/2022 1022   KETONESUR NEGATIVE 02/07/2023 1511   PROTEINUR NEGATIVE 02/07/2023 1511   UROBILINOGEN 1.0 07/11/2022 1022   UROBILINOGEN 0.2 05/11/2010 1640   NITRITE NEGATIVE 02/07/2023 1511   LEUKOCYTESUR NEGATIVE 02/07/2023 1511   Sepsis Labs: @LABRCNTIP (procalcitonin:4,lacticidven:4)  ) Recent Results (from the  past 240 hours)  Resp panel by RT-PCR (RSV, Flu A&B, Covid) Anterior Nasal Swab     Status: None   Collection Time: 04/30/23  2:15 AM   Specimen: Anterior Nasal Swab  Result Value Ref Range Status   SARS Coronavirus 2 by RT PCR NEGATIVE NEGATIVE Final    Comment: (NOTE) SARS-CoV-2 target nucleic acids are NOT DETECTED.  The SARS-CoV-2 RNA is generally detectable in upper respiratory specimens during the acute phase of infection. The lowest concentration of SARS-CoV-2 viral copies this assay can detect is 138 copies/mL. A negative result does not preclude SARS-Cov-2 infection and should not be used as the sole basis for treatment or other patient  management decisions. A negative result may occur with  improper specimen collection/handling, submission of specimen other than nasopharyngeal swab, presence of viral mutation(s) within the areas targeted by this assay, and inadequate number of viral copies(<138 copies/mL). A negative result must be combined with clinical observations, patient history, and epidemiological information. The expected result is Negative.  Fact Sheet for Patients:  BloggerCourse.com  Fact Sheet for Healthcare Providers:  SeriousBroker.it  This test is no t yet approved or cleared by the [place] FDA and  has been authorized for detection and/or diagnosis of SARS-CoV-2 by FDA under an Emergency Use Authorization (EUA). This EUA will remain  in effect (meaning this test can be used) for the duration of the COVID-19 declaration under Section 564(b)(1) of the Act, 21 U.S.C.section 360bbb-3(b)(1), unless the authorization is terminated  or revoked sooner.       Influenza A by PCR NEGATIVE NEGATIVE Final   Influenza B by PCR NEGATIVE NEGATIVE Final    Comment: (NOTE) The Xpert Xpress SARS-CoV-2/FLU/RSV plus assay is intended as an aid in the diagnosis of influenza from Nasopharyngeal swab specimens and should not be used as a sole basis for treatment. Nasal washings and aspirates are unacceptable for Xpert Xpress SARS-CoV-2/FLU/RSV testing.  Fact Sheet for Patients: BloggerCourse.com  Fact Sheet for Healthcare Providers: SeriousBroker.it  This test is not yet approved or cleared by the [place] FDA and has been authorized for detection and/or diagnosis of SARS-CoV-2 by FDA under an Emergency Use Authorization (EUA). This EUA will remain in effect (meaning this test can be used) for the duration of the COVID-19 declaration under Section 564(b)(1) of the Act, 21 U.S.C. section 360bbb-3(b)(1),  unless the authorization is terminated or revoked.     Resp Syncytial Virus by PCR NEGATIVE NEGATIVE Final    Comment: (NOTE) Fact Sheet for Patients: BloggerCourse.com  Fact Sheet for Healthcare Providers: SeriousBroker.it  This test is not yet approved or cleared by the [place] FDA and has been authorized for detection and/or diagnosis of SARS-CoV-2 by FDA under an Emergency Use Authorization (EUA). This EUA will remain in effect (meaning this test can be used) for the duration of the COVID-19 declaration under Section 564(b)(1) of the Act, 21 U.S.C. section 360bbb-3(b)(1), unless the authorization is terminated or revoked.  Performed at [facility], [address] [place], [place] [address]       Studies: No results found.  Scheduled Meds:  aspirin EC  81 mg Oral Daily   Chlorhexidine Gluconate Cloth  6 each Topical Daily   chlorthalidone  25 mg Oral Daily   enoxaparin (LOVENOX) injection  70 mg Subcutaneous Q24H   furosemide  20 mg Oral Daily   insulin aspart  0-20 Units Subcutaneous TID WC   irbesartan  75 mg Oral Daily   [  START ON 05/03/2023] potassium chloride  20 mEq Oral Daily   predniSONE  40 mg Oral Q breakfast   sodium chloride flush  10-40 mL Intracatheter Q12H   spironolactone  25 mg Oral Daily    Continuous Infusions:  azithromycin 500 mg (05/01/23 2207)   cefTRIAXone (ROCEPHIN)  IV 2 g (05/01/23 2130)     LOS: 2 days     Briant Cedar, MD [facility]  If 7PM-7AM, please contact night-coverage [email or web address] 05/02/2023, 3:51 PM

## 2023-05-02 NOTE — Progress Notes (Signed)
 Received message from MD to supply pt with letter to return to work 05/06/23 and remain virtual from home until 06/04/23 due to recent hospitalization and decreased immune system.  RN successfully typed letter and sent to pt via MyChart.

## 2023-05-02 NOTE — Plan of Care (Signed)

## 2023-05-03 ENCOUNTER — Telehealth: Payer: Self-pay

## 2023-05-03 DIAGNOSIS — C50511 Malignant neoplasm of lower-outer quadrant of right female breast: Secondary | ICD-10-CM | POA: Diagnosis not present

## 2023-05-03 DIAGNOSIS — Z170 Estrogen receptor positive status [ER+]: Secondary | ICD-10-CM | POA: Diagnosis not present

## 2023-05-03 LAB — GLUCOSE, CAPILLARY
Glucose-Capillary: 90 mg/dL (ref 70–99)
Glucose-Capillary: 89 mg/dL (ref 70–99)
Glucose-Capillary: 118 mg/dL — ABNORMAL HIGH (ref 70–99)
Glucose-Capillary: 121 mg/dL — ABNORMAL HIGH (ref 70–99)

## 2023-05-03 LAB — CBC WITH DIFFERENTIAL/PLATELET
Abs Immature Granulocytes: 0.03 10*3/uL (ref 0.00–0.07)
Basophils Absolute: 0 10*3/uL (ref 0.0–0.1)
Basophils Relative: 0 %
Eosinophils Absolute: 0.1 10*3/uL (ref 0.0–0.5)
Eosinophils Relative: 2 %
HCT: 32.7 % — ABNORMAL LOW (ref 36.0–46.0)
Hemoglobin: 10.2 g/dL — ABNORMAL LOW (ref 12.0–15.0)
Immature Granulocytes: 1 %
Lymphocytes Relative: 18 %
Lymphs Abs: 1.2 10*3/uL (ref 0.7–4.0)
MCH: 27.9 pg (ref 26.0–34.0)
MCHC: 31.2 g/dL (ref 30.0–36.0)
MCV: 89.6 fL (ref 80.0–100.0)
Monocytes Absolute: 0.9 10*3/uL (ref 0.1–1.0)
Monocytes Relative: 14 %
Neutro Abs: 4.3 10*3/uL (ref 1.7–7.7)
Neutrophils Relative %: 65 %
Platelets: 130 10*3/uL — ABNORMAL LOW (ref 150–400)
RBC: 3.65 MIL/uL — ABNORMAL LOW (ref 3.87–5.11)
RDW: 17.7 % — ABNORMAL HIGH (ref 11.5–15.5)
WBC: 6.5 10*3/uL (ref 4.0–10.5)
nRBC: 0 % (ref 0.0–0.2)

## 2023-05-03 LAB — BASIC METABOLIC PANEL
Anion gap: 6 (ref 5–15)
BUN: 13 mg/dL (ref 6–20)
CO2: 29 mmol/L (ref 22–32)
Calcium: 8.3 mg/dL — ABNORMAL LOW (ref 8.9–10.3)
Chloride: 105 mmol/L (ref 98–111)
Creatinine, Ser: 0.64 mg/dL (ref 0.44–1.00)
GFR, Estimated: >60 mL/min (ref >60)
Glucose, Bld: 71 mg/dL (ref 70–99)
Potassium: 3.4 mmol/L — ABNORMAL LOW (ref 3.5–5.1)
Sodium: 140 mmol/L (ref 135–145)

## 2023-05-03 MED ORDER — POTASSIUM CHLORIDE CRYS ER 20 MEQ PO TBCR
20.0000 meq | EXTENDED_RELEASE_TABLET | Freq: Two times a day (BID) | ORAL | Status: AC
  Administered 2023-05-03: 20 meq via ORAL
  Filled 2023-05-03: qty 1

## 2023-05-03 MED ORDER — IRBESARTAN 150 MG PO TABS
150.0000 mg | ORAL_TABLET | Freq: Every day | ORAL | Status: DC
  Administered 2023-05-03 – 2023-05-04 (×2): 150 mg via ORAL
  Filled 2023-05-03 (×2): qty 1

## 2023-05-03 NOTE — Plan of Care (Signed)

## 2023-05-03 NOTE — Telephone Encounter (Signed)
 Pt called and requested we change the return date to work to 05/20/23 instead of the end of March. She states if she needs more time out she will contact us for an extension.  Letter generated per MD.

## 2023-05-03 NOTE — Progress Notes (Signed)
 SATURATION QUALIFICATIONS: (This note is used to comply with regulatory documentation for home oxygen)  Patient Saturations on Room Air at Rest = 100%  Patient Saturations on Room Air while Ambulating = 100%  Patient Saturations on 0 Liters of oxygen while Ambulating = 100%  Please briefly explain why patient needs home oxygen: None needed. PT has no SOB with Ambulation

## 2023-05-03 NOTE — Progress Notes (Signed)
 PROGRESS NOTE  Pamela [name] ZOX:[id number] DOB: 1965/11/16 DOA: 04/29/2023 PCP: Dorothyann Peng, MD  HPI/Recap of past 24 hours: Pamela [name] is a 58 y.o. female with medical history significant of asthma, hyperlipidemia, hypertension, class III obesity, migraine, obstructive sleep apnea not on CPAP, estrogen receptor positive breast cancer following Dr. Pamelia Hoit who presented to the ED with complaints of progressively worse dyspnea associated with wheezing, productive cough, fatigue, malaise and headache after being diagnosed with influenza recently. In the ED, VS stable except for BP 223/85 mmHg. Lab work: Coronavirus, influenza and RSV PCR negative.  Portable 1 view chest radiograph showing mild cardiomegaly with vascular congestion.  Right infrahilar airspace opacity could reflect asymmetric edema or infection.  Patient admitted for further management.     Today, pt seems to feel better today, restarted her home lasix. Reported good UO. Ambulated the hallway, denies any SOB or chest pain. Reports nagging headache. BP uncontrolled     Assessment/Plan: Principal Problem:   CAP (community acquired pneumonia) Active Problems:   Class 3 severe obesity due to excess calories with serious comorbidity and body mass index (BMI) of 40.0 to 44.9 in adult [facility])   Essential hypertension   Obstructive sleep apnea syndrome, moderate   Mixed hyperlipidemia   Malignant neoplasm of lower-outer quadrant of right breast of female, estrogen receptor positive (HCC)   Prediabetes   Hypokalemia   Hypophosphatemia   Thrombocytopenia (HCC)   Elevated troponin   Normocytic anemia   Grade I diastolic dysfunction   Prolonged QT interval   CAP (community acquired pneumonia) History of asthma Currently afebrile, with no leukocytosis Strep pneumo urine antigen negative Sputum culture pending collection Procalcitonin negative Chest x-ray with right infrahilar airspace opacity as well as mild  cardiomegaly vascular congestion, repeat unremarkable Scheduled and as needed bronchodilators. Continue ceftriaxone, azithromycin  Continue prednisone Supplemental O2 as needed   Essential hypertension BP uncontrolled Increased valsartan, continue chlorthalidone, Aldactone  Hypokalemia Replaced as needed   Chronic diastolic HF BNP 51 (falsely low and obesity) Echo on 04/11/23 showed EF of 55 to 60%, no regional wall motion abnormality, mild, concentric left ventricular hypertrophy Repeat chest x-ray unremarkable Continue home Lasix 20 mg daily  Elevated troponin Likely demand ischemia Echo as above   Prediabetes A1c 5.4 SSI, Accu-Cheks, hypoglycemic protocol   Normocytic anemia In the setting of malignancy/chemo Daily CBC  Transaminitis Ongoing, likely 2/2 malignancy/chemo Follow-up   Prolonged QT interval Avoid QT prolonging meds as possible. Keep electrolytes optimized   Class 3 severe obesity Lifestyle modification advised   Obstructive sleep apnea Reported she still needs to perform her sleep study Refusing CPAP   Malignant neoplasm of lower-outer quadrant of  right breast of female, estrogen receptor positive (HCC) Follow-up with Dr. Pamelia Hoit as scheduled at the cancer center.     Estimated body mass index is 54.26 kg/m as calculated from the following:   Height as of this encounter: 5\' 6"  (1.676 m).   Weight as of this encounter: 152.5 kg.     Code Status: Full  Family Communication: None at bedside  Disposition Plan: Status is: Inpatient Remains inpatient appropriate because: Level of care      Consultants: Oncology  Procedures: None  Antimicrobials: Rocephin, azithromycin  DVT prophylaxis: Lovenox   Objective: Vitals:   05/02/23 2119 05/03/23 0527 05/03/23 0718 05/03/23 1356  BP: (!) 175/79 (!) 161/77  (!) 148/79  Pulse: 62 (!) 56  61  Resp: 18 18  16   Temp: 98 F (36.7  C) 98 F (36.7 C)  98.6 F (37 C)  TempSrc: Oral  Oral  Oral  SpO2: 99% 98%  99%  Weight:   (!) 152.5 kg   Height:        Intake/Output Summary (Last 24 hours) at 05/03/2023 1608 Last data filed at 05/03/2023 1500 Gross per 24 hour  Intake 480 ml  Output --  Net 480 ml   Filed Weights   05/01/23 0709 05/02/23 0438 05/03/23 0718  Weight: (!) 151.5 kg (!) 152.5 kg (!) 152.5 kg    Exam: General: NAD  Cardiovascular: S1, S2 present Respiratory: CTAB Abdomen: Soft, nontender, nondistended, bowel sounds present Musculoskeletal: No bilateral pedal edema noted Skin: Normal Psychiatry: Normal mood     Data Reviewed: CBC: Recent Labs  Lab 04/29/23 2340 04/30/23 0602 05/01/23 0508 05/02/23 0444 05/03/23 0522  WBC 5.1 5.0 12.7* 9.2 6.5  NEUTROABS 3.5  --   --  6.8 4.3  HGB 10.7* 11.3* 10.0* 9.8* 10.2*  HCT 33.4* 36.5 31.8* 31.3* 32.7*  MCV 86.8 88.8 87.6 89.2 89.6  PLT 140* 140* 185 152 130*   Basic Metabolic Panel: Recent Labs  Lab 04/29/23 2340 04/30/23 0602 05/01/23 0508 05/02/23 0444 05/03/23 0522  NA 137 142 140 137 140  K 2.6* 3.5 3.9 3.3* 3.4*  CL 105 108 108 106 105  CO2 23 22 25 26 29   GLUCOSE 126* 182* 108* 85 71  BUN 8 8 11 12 13   CREATININE 0.73 0.68 0.62 0.54 0.64  CALCIUM 8.6* 9.0 8.6* 8.2* 8.3*  MG 1.7 1.8  --   --   --   PHOS  --  2.4*  --   --   --    GFR: Estimated Creatinine Clearance: 119.7 mL/min (by C-G formula based on SCr of 0.64 mg/dL). Liver Function Tests: Recent Labs  Lab 04/29/23 2340 05/01/23 0508  AST 46* 57*  ALT 27 34  ALKPHOS 97 88  BILITOT 2.2* 1.6*  PROT 6.6 5.9*  ALBUMIN 3.2* 2.8*   No results for input(s): "LIPASE", "AMYLASE" in the last 168 hours. No results for input(s): "AMMONIA" in the last 168 hours. Coagulation Profile: No results for input(s): "INR", "PROTIME" in the last 168 hours. Cardiac Enzymes: No results for input(s): "CKTOTAL", "CKMB", "CKMBINDEX", "TROPONINI" in the last 168 hours. BNP (last 3 results) No results for input(s): "PROBNP" in the  last 8760 hours. HbA1C: Recent Labs    05/01/23 0509  HGBA1C 5.4   CBG: Recent Labs  Lab 05/02/23 1107 05/02/23 1648 05/02/23 2115 05/03/23 0723 05/03/23 1119  GLUCAP 79 121* 124* 90 89   Lipid Profile: No results for input(s): "CHOL", "HDL", "LDLCALC", "TRIG", "CHOLHDL", "LDLDIRECT" in the last 72 hours. Thyroid Function Tests: No results for input(s): "TSH", "T4TOTAL", "FREET4", "T3FREE", "THYROIDAB" in the last 72 hours. Anemia Panel: No results for input(s): "VITAMINB12", "FOLATE", "FERRITIN", "TIBC", "IRON", "RETICCTPCT" in the last 72 hours. Urine analysis:    Component Value Date/Time   COLORURINE YELLOW 02/07/2023 1511   APPEARANCEUR HAZY (A) 02/07/2023 1511   LABSPEC 1.016 02/07/2023 1511   PHURINE 6.0 02/07/2023 1511   GLUCOSEU NEGATIVE 02/07/2023 1511   HGBUR MODERATE (A) 02/07/2023 1511   BILIRUBINUR NEGATIVE 02/07/2023 1511   BILIRUBINUR negative 07/11/2022 1022   KETONESUR NEGATIVE 02/07/2023 1511   PROTEINUR NEGATIVE 02/07/2023 1511   UROBILINOGEN 1.0 07/11/2022 1022   UROBILINOGEN 0.2 05/11/2010 1640   NITRITE NEGATIVE 02/07/2023 1511   LEUKOCYTESUR NEGATIVE 02/07/2023 1511   Sepsis Labs: @LABRCNTIP (procalcitonin:4,lacticidven:4)  )  Recent Results (from the past 240 hours)  Resp panel by RT-PCR (RSV, Flu A&B, Covid) Anterior Nasal Swab     Status: None   Collection Time: 04/30/23  2:15 AM   Specimen: Anterior Nasal Swab  Result Value Ref Range Status   SARS Coronavirus 2 by RT PCR NEGATIVE NEGATIVE Final    Comment: (NOTE) SARS-CoV-2 target nucleic acids are NOT DETECTED.  The SARS-CoV-2 RNA is generally detectable in upper respiratory specimens during the acute phase of infection. The lowest concentration of SARS-CoV-2 viral copies this assay can detect is 138 copies/mL. A negative result does not preclude SARS-Cov-2 infection and should not be used as the sole basis for treatment or other patient management decisions. A negative result may  occur with  improper specimen collection/handling, submission of specimen other than nasopharyngeal swab, presence of viral mutation(s) within the areas targeted by this assay, and inadequate number of viral copies(<138 copies/mL). A negative result must be combined with clinical observations, patient history, and epidemiological information. The expected result is Negative.  Fact Sheet for Patients:  BloggerCourse.com  Fact Sheet for Healthcare Providers:  SeriousBroker.it  This test is no t yet approved or cleared by the [place] FDA and  has been authorized for detection and/or diagnosis of SARS-CoV-2 by FDA under an Emergency Use Authorization (EUA). This EUA will remain  in effect (meaning this test can be used) for the duration of the COVID-19 declaration under Section 564(b)(1) of the Act, 21 U.S.C.section 360bbb-3(b)(1), unless the authorization is terminated  or revoked sooner.       Influenza A by PCR NEGATIVE NEGATIVE Final   Influenza B by PCR NEGATIVE NEGATIVE Final    Comment: (NOTE) The Xpert Xpress SARS-CoV-2/FLU/RSV plus assay is intended as an aid in the diagnosis of influenza from Nasopharyngeal swab specimens and should not be used as a sole basis for treatment. Nasal washings and aspirates are unacceptable for Xpert Xpress SARS-CoV-2/FLU/RSV testing.  Fact Sheet for Patients: BloggerCourse.com  Fact Sheet for Healthcare Providers: SeriousBroker.it  This test is not yet approved or cleared by the [place] FDA and has been authorized for detection and/or diagnosis of SARS-CoV-2 by FDA under an Emergency Use Authorization (EUA). This EUA will remain in effect (meaning this test can be used) for the duration of the COVID-19 declaration under Section 564(b)(1) of the Act, 21 U.S.C. section 360bbb-3(b)(1), unless the authorization is terminated  or revoked.     Resp Syncytial Virus by PCR NEGATIVE NEGATIVE Final    Comment: (NOTE) Fact Sheet for Patients: BloggerCourse.com  Fact Sheet for Healthcare Providers: SeriousBroker.it  This test is not yet approved or cleared by the [place] FDA and has been authorized for detection and/or diagnosis of SARS-CoV-2 by FDA under an Emergency Use Authorization (EUA). This EUA will remain in effect (meaning this test can be used) for the duration of the COVID-19 declaration under Section 564(b)(1) of the Act, 21 U.S.C. section 360bbb-3(b)(1), unless the authorization is terminated or revoked.  Performed at [facility], [address] [place], [place] [address]       Studies: DG CHEST PORT 1 VIEW Result Date: 05/02/2023 CLINICAL DATA:  Shortness of breath. EXAM: PORTABLE CHEST 1 VIEW COMPARISON:  04/29/2023 FINDINGS: Power port type central venous catheter on the right with tip projecting over the cavoatrial junction region. No pneumothorax. Mild cardiac enlargement. Lungs are clear. No pleural effusion or pneumothorax. Mediastinal contours appear intact. Calcification of the aorta. IMPRESSION: Mild cardiac enlargement.  No evidence of active pulmonary disease. Electronically Signed   By: Burman Nieves M.D.   On: 05/02/2023 20:40    Scheduled Meds:  aspirin EC  81 mg Oral Daily   Chlorhexidine Gluconate Cloth  6 each Topical Daily   chlorthalidone  25 mg Oral Daily   enoxaparin (LOVENOX) injection  70 mg Subcutaneous Q24H   furosemide  20 mg Oral Daily   insulin aspart  0-15 Units Subcutaneous TID WC   irbesartan  150 mg Oral Daily   potassium chloride  20 mEq Oral BID   predniSONE  40 mg Oral Q breakfast   sodium chloride flush  10-40 mL Intracatheter Q12H   spironolactone  25 mg Oral Daily    Continuous Infusions:  azithromycin 500 mg (05/02/23 2235)   cefTRIAXone (ROCEPHIN)  IV 2 g (05/02/23  2148)     LOS: 3 days     Briant Cedar, MD [facility]  If 7PM-7AM, please contact night-coverage [email or web address] 05/03/2023, 4:08 PM

## 2023-05-04 DIAGNOSIS — J189 Pneumonia, unspecified organism: Secondary | ICD-10-CM | POA: Diagnosis not present

## 2023-05-04 LAB — BASIC METABOLIC PANEL
Anion gap: 4 — ABNORMAL LOW (ref 5–15)
BUN: 13 mg/dL (ref 6–20)
CO2: 31 mmol/L (ref 22–32)
Calcium: 8.6 mg/dL — ABNORMAL LOW (ref 8.9–10.3)
Chloride: 104 mmol/L (ref 98–111)
Creatinine, Ser: 0.54 mg/dL (ref 0.44–1.00)
GFR, Estimated: >60 mL/min (ref >60)
Glucose, Bld: 72 mg/dL (ref 70–99)
Potassium: 3.4 mmol/L — ABNORMAL LOW (ref 3.5–5.1)
Sodium: 139 mmol/L (ref 135–145)

## 2023-05-04 LAB — GLUCOSE, CAPILLARY
Glucose-Capillary: 81 mg/dL (ref 70–99)
Glucose-Capillary: 98 mg/dL (ref 70–99)

## 2023-05-04 MED ORDER — ENOXAPARIN SODIUM 80 MG/0.8ML IJ SOSY: 80.0000 mg | PREFILLED_SYRINGE | INTRAMUSCULAR | Status: DC

## 2023-05-04 MED ORDER — POTASSIUM CHLORIDE CRYS ER 20 MEQ PO TBCR
40.0000 meq | EXTENDED_RELEASE_TABLET | Freq: Once | ORAL | Status: AC
  Administered 2023-05-04: 40 meq via ORAL
  Filled 2023-05-04: qty 2

## 2023-05-04 MED ORDER — HEPARIN SOD (PORK) LOCK FLUSH 100 UNIT/ML IV SOLN
500.0000 [IU] | INTRAVENOUS | Status: AC | PRN
  Administered 2023-05-04: 500 [IU]
  Filled 2023-05-04: qty 5

## 2023-05-04 NOTE — Discharge Summary (Signed)
 Physician Discharge Summary   Patient: Pamela [name] MRN: [id number] DOB: Apr 21, 1965  Admit date:     04/29/2023  Discharge date: 05/04/23  Discharge Physician: Briant Cedar   PCP: Dorothyann Peng, MD   Recommendations at discharge:   Follow-up with PCP Follow-up with oncology as scheduled  Discharge Diagnoses: Principal Problem:   CAP (community acquired pneumonia) Active Problems:   Class 3 severe obesity due to excess calories with serious comorbidity and body mass index (BMI) of 40.0 to 44.9 in adult [facility])   Essential hypertension   Obstructive sleep apnea syndrome, moderate   Mixed hyperlipidemia   Malignant neoplasm of lower-outer quadrant of right breast of female, estrogen receptor positive (HCC)   Prediabetes   Hypokalemia   Hypophosphatemia   Thrombocytopenia (HCC)   Elevated troponin   Normocytic anemia   Grade I diastolic dysfunction   Prolonged QT interval   Hospital Course: Pamela [name] is a 58 y.o. female with medical history significant of asthma, hyperlipidemia, hypertension, class III obesity, migraine, obstructive sleep apnea not on CPAP, estrogen receptor positive breast cancer following Dr. Pamelia Hoit who presented to the ED with complaints of progressively worse dyspnea associated with wheezing, productive cough, fatigue, malaise and headache after being diagnosed with influenza recently. In the ED, VS stable except for BP 223/85 mmHg. Lab work: Coronavirus, influenza and RSV PCR negative.  Portable 1 view chest radiograph showing mild cardiomegaly with vascular congestion.  Right infrahilar airspace opacity could reflect asymmetric edema or infection.  Patient admitted for further management.     Patient denies any new complaints.  Stable to discharge home to follow-up with PCP as well as oncology.   Assessment and Plan:  CAP (community acquired pneumonia) History of asthma Currently afebrile, with no leukocytosis Strep pneumo urine  antigen negative Procalcitonin negative Chest x-ray with right infrahilar airspace opacity as well as mild cardiomegaly vascular congestion, repeat unremarkable Scheduled and as needed bronchodilators. Completed ceftriaxone, azithromycin, and prednisone X 5 days   Essential hypertension Continue home valsartan, chlorthalidone, Aldactone   Hypokalemia Replaced as needed   Chronic diastolic HF BNP 51 (falsely low and obesity) Echo on 04/11/23 showed EF of 55 to 60%, no regional wall motion abnormality, mild, concentric left ventricular hypertrophy Repeat chest x-ray unremarkable Continue home Lasix 20 mg daily prn   Elevated troponin Likely demand ischemia Echo as above   Prediabetes A1c 5.4 Continue lifestyle modification   Normocytic anemia In the setting of malignancy/chemo   Transaminitis Ongoing, likely 2/2 malignancy/chemo Follow-up   Prolonged QT interval Avoid QT prolonging meds as possible. Keep electrolytes optimized   Class 3 severe obesity Lifestyle modification advised   Obstructive sleep apnea Reported she still needs to perform her sleep study Refusing CPAP   Malignant neoplasm of lower-outer quadrant of  right breast of female, estrogen receptor positive (HCC) Follow-up with Dr. Pamelia Hoit as scheduled at the cancer center.         Consultants: None Procedures performed: None Disposition: Home Diet recommendation:  Cardiac and Carb modified diet DISCHARGE MEDICATION: Allergies as of 05/04/2023       Reactions   Pollen Extract Shortness Of Breath   Compazine [prochlorperazine] Anxiety   IV compazine causes severe anxiety attack   Latex Rash   Codeine Hives   Diflucan [fluconazole] Hives   Shellfish Allergy Hives   Betadine [povidone Iodine] Rash        Medication List     TAKE these medications    Aerochamber Plus Device  Please dispense spacer for inhaler use   albuterol 108 (90 Base) MCG/ACT inhaler Commonly known as: VENTOLIN  HFA Inhale 2 puffs into the lungs every 6 (six) hours as needed for wheezing or shortness of breath.   aspirin EC 81 MG tablet Take 1 tablet (81 mg total) by mouth daily. Swallow whole.   benzonatate 100 MG capsule Commonly known as: TESSALON Take 1 capsule (100 mg total) by mouth 3 (three) times daily as needed for cough.   chlorthalidone 25 MG tablet Commonly known as: HYGROTON Take 1 tablet (25 mg total) by mouth daily.   furosemide 20 MG tablet Commonly known as: LASIX Take 1 tablet (20 mg total) by mouth as needed. TAKE 1 TABLET AS NEEDED FOR SWELLING OR SHORTNESS OF BREATH OR WEIGHT GAIN. 3 POUNDS WITHIN 24HR OR 5 POUNDS 7 DAYS   lidocaine-prilocaine cream Commonly known as: EMLA Apply to affected area once AS DIRECTED   Mirena (52 MG) 20 MCG/DAY Iud Generic drug: levonorgestrel 1 each by Intrauterine route once.   potassium chloride 10 MEQ CR capsule Commonly known as: MICRO-K Take 40 mEq by mouth daily.   rosuvastatin 40 MG tablet Commonly known as: CRESTOR Take 1 tablet (40 mg total) by mouth daily.   spironolactone 25 MG tablet Commonly known as: ALDACTONE Take 1 tablet (25 mg total) by mouth daily.   valsartan 160 MG tablet Commonly known as: Diovan Take 1 tablet (160 mg total) by mouth daily.   venlafaxine XR 37.5 MG 24 hr capsule Commonly known as: EFFEXOR-XR Take 37.5 mg by mouth every morning.        Follow-up Information     Dorothyann Peng, MD. Schedule an appointment as soon as possible for a visit in 1 week(s).   Specialty: Internal Medicine Contact information: [address] [place] [phone]                Discharge Exam: Ceasar Mons Weights   05/01/23 9147 05/02/23 0438 05/03/23 0718  Weight: (!) 151.5 kg (!) 152.5 kg (!) 152.5 kg   General: NAD  Cardiovascular: S1, S2 present Respiratory: CTAB Abdomen: Soft, nontender, nondistended, bowel sounds present Musculoskeletal: No bilateral pedal edema  noted Skin: Normal Psychiatry: Normal mood   Condition at discharge: stable  The results of significant diagnostics from this hospitalization (including imaging, microbiology, ancillary and laboratory) are listed below for reference.   Imaging Studies: DG CHEST PORT 1 VIEW Result Date: 05/02/2023 CLINICAL DATA:  Shortness of breath. EXAM: PORTABLE CHEST 1 VIEW COMPARISON:  04/29/2023 FINDINGS: Power port type central venous catheter on the right with tip projecting over the cavoatrial junction region. No pneumothorax. Mild cardiac enlargement. Lungs are clear. No pleural effusion or pneumothorax. Mediastinal contours appear intact. Calcification of the aorta. IMPRESSION: Mild cardiac enlargement.  No evidence of active pulmonary disease. Electronically Signed   By: Burman Nieves M.D.   On: 05/02/2023 20:40   DG Chest Port 1 View Result Date: 04/30/2023 CLINICAL DATA:  Shortness of breath EXAM: PORTABLE CHEST 1 VIEW COMPARISON:  02/07/2023 FINDINGS: Right Port-A-Cath remains in place, unchanged. Heart is mildly enlarged. Vascular congestion. Right infrahilar airspace opacity. No effusions. No acute bony abnormality. IMPRESSION: Mild cardiomegaly with vascular congestion. Right infrahilar airspace opacity could reflect asymmetric edema or infection. Electronically Signed   By: Charlett Nose M.D.   On: 04/30/2023 00:25   ECHOCARDIOGRAM COMPLETE Result Date: 04/11/2023    ECHOCARDIOGRAM REPORT   Patient Name:   [name] Date of  Exam: 04/11/2023 Medical Rec #:  [id number]          Height:       69.0 in Accession #:    [id number]         Weight:       298.4 lb Date of Birth:  1965-03-18          BSA:          2.449 m Patient Age:    57 years           BP:           181/79 mmHg Patient Gender: F                  HR:           83 bpm. Exam Location:  Outpatient Procedure: 2D Echo, 3D Echo, Cardiac Doppler, Color Doppler and Strain Analysis Indications:    CHF  History:        Patient has prior  history of Echocardiogram examinations, most                 recent 08/21/2022. CHF, Signs/Symptoms:Shortness of Breath; Risk                 Factors:Dyslipidemia, Sleep Apnea and Hypertension.  Sonographer:    Vern Claude Referring Phys: [id number] DANIEL R BENSIMHON IMPRESSIONS  1. Left ventricular ejection fraction, by estimation, is 55 to 60%. The left ventricle has normal function. The left ventricle has no regional wall motion abnormalities. There is mild concentric left ventricular hypertrophy. Left ventricular diastolic parameters are indeterminate.  2. Right ventricular systolic function is normal. The right ventricular size is normal.  3. The mitral valve is normal in structure. Trivial mitral valve regurgitation. No evidence of mitral stenosis.  4. The aortic valve is tricuspid. Aortic valve regurgitation is not visualized. Aortic valve sclerosis is present, with no evidence of aortic valve stenosis.  5. The inferior vena cava is normal in size with greater than 50% respiratory variability, suggesting right atrial pressure of 3 mmHg. FINDINGS  Left Ventricle: Left ventricular ejection fraction, by estimation, is 55 to 60%. The left ventricle has normal function. The left ventricle has no regional wall motion abnormalities. The left ventricular internal cavity size was normal in size. There is  mild concentric left ventricular hypertrophy. Left ventricular diastolic parameters are indeterminate. Right Ventricle: The right ventricular size is normal. No increase in right ventricular wall thickness. Right ventricular systolic function is normal. Left Atrium: Left atrial size was normal in size. Right Atrium: Right atrial size was normal in size. Pericardium: There is no evidence of pericardial effusion. Mitral Valve: The mitral valve is normal in structure. Trivial mitral valve regurgitation. No evidence of mitral valve stenosis. MV peak gradient, 2.8 mmHg. The mean mitral valve gradient is 1.0 mmHg. Tricuspid  Valve: The tricuspid valve is normal in structure. Tricuspid valve regurgitation is trivial. No evidence of tricuspid stenosis. Aortic Valve: The aortic valve is tricuspid. Aortic valve regurgitation is not visualized. Aortic valve sclerosis is present, with no evidence of aortic valve stenosis. Aortic valve mean gradient measures 6.0 mmHg. Aortic valve peak gradient measures 10.5 mmHg. Aortic valve area, by VTI measures 2.16 cm. Pulmonic Valve: The pulmonic valve was not well visualized. Pulmonic valve regurgitation is not visualized. No evidence of pulmonic stenosis. Aorta: The aortic root is normal in size and structure. Venous: The inferior vena cava is normal in size with greater than 50% respiratory variability, suggesting right  atrial pressure of 3 mmHg. IAS/Shunts: No atrial level shunt detected by color flow Doppler.  LEFT VENTRICLE PLAX 2D LVIDd:         4.60 cm      Diastology LVIDs:         3.00 cm      LV e' medial:    6.06 cm/s LV PW:         1.20 cm      LV E/e' medial:  12.5 LV IVS:        1.10 cm      LV e' lateral:   6.84 cm/s LVOT diam:     2.00 cm      LV E/e' lateral: 11.1 LV SV:         58 LV SV Index:   24 LVOT Area:     3.14 cm  LV Volumes (MOD) LV vol d, MOD A2C: 250.0 ml LV vol d, MOD A4C: 210.0 ml LV vol s, MOD A2C: 67.3 ml LV vol s, MOD A4C: 72.0 ml LV SV MOD A2C:     182.7 ml LV SV MOD A4C:     210.0 ml LV SV MOD BP:      158.4 ml RIGHT VENTRICLE             IVC RV Basal diam:  4.20 cm     IVC diam: 1.50 cm RV Mid diam:    3.20 cm RV S prime:     14.90 cm/s TAPSE (M-mode): 2.7 cm LEFT ATRIUM             Index        RIGHT ATRIUM           Index LA diam:        3.60 cm 1.47 cm/m   RA Area:     12.20 cm LA Vol (A2C):   64.8 ml 26.46 ml/m  RA Volume:   27.00 ml  11.03 ml/m LA Vol (A4C):   40.9 ml 16.70 ml/m LA Biplane Vol: 51.4 ml 20.99 ml/m  AORTIC VALVE                     PULMONIC VALVE AV Area (Vmax):    2.06 cm      PV Vmax:       0.86 m/s AV Area (Vmean):   1.76 cm      PV  Peak grad:  3.0 mmHg AV Area (VTI):     2.16 cm AV Vmax:           162.00 cm/s AV Vmean:          111.000 cm/s AV VTI:            0.271 m AV Peak Grad:      10.5 mmHg AV Mean Grad:      6.0 mmHg LVOT Vmax:         106.00 cm/s LVOT Vmean:        62.300 cm/s LVOT VTI:          0.186 m LVOT/AV VTI ratio: 0.69  AORTA Ao Root diam: 3.20 cm Ao Asc diam:  2.60 cm MITRAL VALVE MV Area (PHT): 3.68 cm    SHUNTS MV Area VTI:   2.79 cm    Systemic VTI:  0.19 m MV Peak grad:  2.8 mmHg    Systemic Diam: 2.00 cm MV Mean grad:  1.0 mmHg MV Vmax:       0.84 m/s  MV Vmean:      52.7 cm/s MV Decel Time: 206 msec MV E velocity: 75.70 cm/s MV A velocity: 84.40 cm/s MV E/A ratio:  0.90 Kardie Tobb DO Electronically signed by Thomasene Ripple DO Signature Date/Time: 04/11/2023/3:46:13 PM    Final     Microbiology: Results for orders placed or performed during the hospital encounter of 04/29/23  Resp panel by RT-PCR (RSV, Flu A&B, Covid) Anterior Nasal Swab     Status: None   Collection Time: 04/30/23  2:15 AM   Specimen: Anterior Nasal Swab  Result Value Ref Range Status   SARS Coronavirus 2 by RT PCR NEGATIVE NEGATIVE Final    Comment: (NOTE) SARS-CoV-2 target nucleic acids are NOT DETECTED.  The SARS-CoV-2 RNA is generally detectable in upper respiratory specimens during the acute phase of infection. The lowest concentration of SARS-CoV-2 viral copies this assay can detect is 138 copies/mL. A negative result does not preclude SARS-Cov-2 infection and should not be used as the sole basis for treatment or other patient management decisions. A negative result may occur with  improper specimen collection/handling, submission of specimen other than nasopharyngeal swab, presence of viral mutation(s) within the areas targeted by this assay, and inadequate number of viral copies(<138 copies/mL). A negative result must be combined with clinical observations, patient history, and epidemiological information. The expected result  is Negative.  Fact Sheet for Patients:  BloggerCourse.com  Fact Sheet for Healthcare Providers:  SeriousBroker.it  This test is no t yet approved or cleared by the [place] FDA and  has been authorized for detection and/or diagnosis of SARS-CoV-2 by FDA under an Emergency Use Authorization (EUA). This EUA will remain  in effect (meaning this test can be used) for the duration of the COVID-19 declaration under Section 564(b)(1) of the Act, 21 U.S.C.section 360bbb-3(b)(1), unless the authorization is terminated  or revoked sooner.       Influenza A by PCR NEGATIVE NEGATIVE Final   Influenza B by PCR NEGATIVE NEGATIVE Final    Comment: (NOTE) The Xpert Xpress SARS-CoV-2/FLU/RSV plus assay is intended as an aid in the diagnosis of influenza from Nasopharyngeal swab specimens and should not be used as a sole basis for treatment. Nasal washings and aspirates are unacceptable for Xpert Xpress SARS-CoV-2/FLU/RSV testing.  Fact Sheet for Patients: BloggerCourse.com  Fact Sheet for Healthcare Providers: SeriousBroker.it  This test is not yet approved or cleared by the [place] FDA and has been authorized for detection and/or diagnosis of SARS-CoV-2 by FDA under an Emergency Use Authorization (EUA). This EUA will remain in effect (meaning this test can be used) for the duration of the COVID-19 declaration under Section 564(b)(1) of the Act, 21 U.S.C. section 360bbb-3(b)(1), unless the authorization is terminated or revoked.     Resp Syncytial Virus by PCR NEGATIVE NEGATIVE Final    Comment: (NOTE) Fact Sheet for Patients: BloggerCourse.com  Fact Sheet for Healthcare Providers: SeriousBroker.it  This test is not yet approved or cleared by the [place] FDA and has been authorized for detection and/or diagnosis of  SARS-CoV-2 by FDA under an Emergency Use Authorization (EUA). This EUA will remain in effect (meaning this test can be used) for the duration of the COVID-19 declaration under Section 564(b)(1) of the Act, 21 U.S.C. section 360bbb-3(b)(1), unless the authorization is terminated or revoked.  Performed at [facility], [address], [place], [place] [address]     Labs: CBC: Recent Labs  Lab 04/29/23 2340 04/30/23 0602 05/01/23 1324 05/02/23  0444 05/03/23 0522  WBC 5.1 5.0 12.7* 9.2 6.5  NEUTROABS 3.5  --   --  6.8 4.3  HGB 10.7* 11.3* 10.0* 9.8* 10.2*  HCT 33.4* 36.5 31.8* 31.3* 32.7*  MCV 86.8 88.8 87.6 89.2 89.6  PLT 140* 140* 185 152 130*   Basic Metabolic Panel: Recent Labs  Lab 04/29/23 2340 04/30/23 0602 05/01/23 0508 05/02/23 0444 05/03/23 0522 05/04/23 0500  NA 137 142 140 137 140 139  K 2.6* 3.5 3.9 3.3* 3.4* 3.4*  CL 105 108 108 106 105 104  CO2 23 22 25 26 29 31   GLUCOSE 126* 182* 108* 85 71 72  BUN 8 8 11 12 13 13   CREATININE 0.73 0.68 0.62 0.54 0.64 0.54  CALCIUM 8.6* 9.0 8.6* 8.2* 8.3* 8.6*  MG 1.7 1.8  --   --   --   --   PHOS  --  2.4*  --   --   --   --    Liver Function Tests: Recent Labs  Lab 04/29/23 2340 05/01/23 0508  AST 46* 57*  ALT 27 34  ALKPHOS 97 88  BILITOT 2.2* 1.6*  PROT 6.6 5.9*  ALBUMIN 3.2* 2.8*   CBG: Recent Labs  Lab 05/03/23 1119 05/03/23 1641 05/03/23 2057 05/04/23 0728 05/04/23 1218  GLUCAP 89 118* 121* 81 98    Discharge time spent: greater than 30 minutes.  Signed: Briant Cedar, MD [facility] 05/04/2023

## 2023-05-06 ENCOUNTER — Telehealth: Payer: Self-pay

## 2023-05-06 NOTE — Transitions of Care (Post Inpatient/ED Visit) (Cosign Needed Addendum)
   05/06/2023  Name: KLARA STJAMES MRN: [id number] DOB: Oct 18, 1965  Today's TOC FU Call Status:   Patient's Name and Date of Birth confirmed.  Transition Care Management Follow-up Telephone Call Date of Discharge: 05/04/23 Discharge Facility: [facility]) Type of Discharge: Inpatient Admission Primary Inpatient Discharge Diagnosis:: CAP How have you been since you were released from the hospital?: Better Any questions or concerns?: No  Items Reviewed: Did you receive and understand the discharge instructions provided?: Yes Medications obtained,verified, and reconciled?: Yes (Medications Reviewed) Any new allergies since your discharge?: No Dietary orders reviewed?: Yes Do you have support at home?: Yes People in Home: child(ren), adult  Medications Reviewed Today: Medications Reviewed Today   Medications were not reviewed in this encounter     Home Care and Equipment/Supplies: Were Home Health Services Ordered?: No Any new equipment or medical supplies ordered?: No  Functional Questionnaire: Do you need assistance with bathing/showering or dressing?: No Do you need assistance with meal preparation?: No Do you need assistance with eating?: No Do you have difficulty maintaining continence: No Do you need assistance with getting out of bed/getting out of a chair/moving?: No Do you have difficulty managing or taking your medications?: No  Follow up appointments reviewed: PCP Follow-up appointment confirmed?: Yes Date of PCP follow-up appointment?: 05/09/23 Follow-up Provider: Dorothyann Peng Specialist [facility] Follow-up appointment confirmed?: NA Do you need transportation to your follow-up appointment?: No Do you understand care options if your condition(s) worsen?: Yes-patient verbalized understanding    SIGNATURE Randa Lynn, CMA

## 2023-05-09 ENCOUNTER — Ambulatory Visit: Admitting: Internal Medicine

## 2023-05-09 ENCOUNTER — Encounter: Payer: Self-pay | Admitting: Internal Medicine

## 2023-05-09 VITALS — BP 118/74 | HR 88 | Temp 98.6°F | Ht 66.0 in | Wt 313.8 lb

## 2023-05-09 DIAGNOSIS — C50511 Malignant neoplasm of lower-outer quadrant of right female breast: Secondary | ICD-10-CM

## 2023-05-09 DIAGNOSIS — I119 Hypertensive heart disease without heart failure: Secondary | ICD-10-CM | POA: Diagnosis not present

## 2023-05-09 DIAGNOSIS — I700 Atherosclerosis of aorta: Secondary | ICD-10-CM

## 2023-05-09 DIAGNOSIS — Z566 Other physical and mental strain related to work: Secondary | ICD-10-CM

## 2023-05-09 DIAGNOSIS — R7401 Elevation of levels of liver transaminase levels: Secondary | ICD-10-CM | POA: Diagnosis not present

## 2023-05-09 DIAGNOSIS — Z6841 Body Mass Index (BMI) 40.0 and over, adult: Secondary | ICD-10-CM

## 2023-05-09 DIAGNOSIS — E66813 Obesity, class 3: Secondary | ICD-10-CM

## 2023-05-09 DIAGNOSIS — J189 Pneumonia, unspecified organism: Secondary | ICD-10-CM | POA: Diagnosis not present

## 2023-05-09 DIAGNOSIS — Z170 Estrogen receptor positive status [ER+]: Secondary | ICD-10-CM

## 2023-05-09 DIAGNOSIS — E876 Hypokalemia: Secondary | ICD-10-CM

## 2023-05-09 NOTE — Assessment & Plan Note (Signed)
Chronic, initially s/p R lumpectomy, chemo and XRT.  Diagnosed with metastatic breast cancer in 2023. Currently being treated with Enhurtu.

## 2023-05-09 NOTE — Assessment & Plan Note (Signed)
Chronic, LDL goal is less than 70.  She will continue with rosuvastatin daily.

## 2023-05-09 NOTE — Progress Notes (Signed)
 I,Pamela [name], CMA,acting as a Neurosurgeon for Pamela Aliment, MD.,have documented all relevant documentation on the behalf of Pamela Aliment, MD,as directed by  Pamela Aliment, MD while in the presence of Pamela Aliment, MD.  Subjective:  Patient ID: Pamela [name] , female    DOB: Jan 24, 1966 , 58 y.o.   MRN: [id number]  Chief Complaint  Patient presents with   Hospitalization Follow-up    HPI  Patient presents today for hospital follow up. She presented to [facility] on 04/29/23 for further evaluation of shortness of breath. She complained of progressively worsening dyspnea associated with wheezing, productive cough, fatigue, malaise and headache after recent diagnosis of influenza. In the ED, VS stable except for BP 223/85 mmHg. Lab work: Coronavirus, influenza and RSV PCR negative.  Portable 1 view chest radiograph showing mild cardiomegaly with vascular congestion.  Right infrahilar airspace opacity could reflect asymmetric edema or infection.  Patient admitted for further management.   She was discharged in stable condition on 05/04/23. Today, she reports feeling stressed. She is still trying recover & dealing with life & work. Unfortunately, her employer is not willing to follow guidelines given by her oncologist.    Hypertension This is a chronic problem. The current episode started more than 1 year ago. The problem has been gradually improving since onset. The problem is controlled. There are no associated agents to hypertension. Risk factors for coronary artery disease include obesity and sedentary lifestyle. Past treatments include diuretics and angiotensin blockers. There are no compliance problems.  There is no history of angina.     Past Medical History:  Diagnosis Date   Anxiety    Asthma    Back pain    Breast cancer (HCC)    Constipation    DUB (dysfunctional uterine bleeding) 2007   Edema of both lower extremities    Elevated cholesterol    Family history of  breast cancer 05/12/2020   Family history of lung cancer 05/12/2020   Food allergy    Grade I diastolic dysfunction 04/30/2023   H/O menorrhagia 05/01/2006   High cholesterol    History of ovarian cyst 2007   Hypertension 03/31/2005   Increased BMI 07/11/2006   Joint pain    Migraine    N&V (nausea and vomiting)    Obesity 2007   Obstructive sleep apnea syndrome, moderate 10/27/2013   no cpap   Personal history of chemotherapy    Personal history of radiation therapy    Sleep apnea    SOB (shortness of breath)    Vitamin D deficiency    Vitamin D deficiency    Weight loss 07/11/2006     Family History  Problem Relation Age of Onset   Hypertension Mother    High Cholesterol Mother    Thyroid disease Mother    Cancer Mother    Sleep apnea Mother    Hypertension Father    Heart attack Father    Sudden death Father    Diabetes Maternal Grandmother    Bone cancer Maternal Grandfather        dx after 2   Breast cancer Maternal Aunt        dx 30s   Lung cancer Maternal Aunt        dx after 50     Current Outpatient Medications:    albuterol (VENTOLIN HFA) 108 (90 Base) MCG/ACT inhaler, Inhale 2 puffs into the lungs every 6 (six) hours as needed for wheezing or shortness  of breath., Disp: 8 g, Rfl: 2   aspirin EC 81 MG tablet, Take 1 tablet (81 mg total) by mouth daily. Swallow whole., Disp: 30 tablet, Rfl: 11   benzonatate (TESSALON) 100 MG capsule, Take 1 capsule (100 mg total) by mouth 3 (three) times daily as needed for cough., Disp: 30 capsule, Rfl: 0   chlorthalidone (HYGROTON) 25 MG tablet, Take 1 tablet (25 mg total) by mouth daily., Disp: 90 tablet, Rfl: 1   furosemide (LASIX) 20 MG tablet, Take 1 tablet (20 mg total) by mouth as needed. TAKE 1 TABLET AS NEEDED FOR SWELLING OR SHORTNESS OF BREATH OR WEIGHT GAIN. 3 POUNDS WITHIN 24HR OR 5 POUNDS 7 DAYS, Disp: 90 tablet, Rfl: 3   levonorgestrel (MIRENA, 52 MG,) 20 MCG/DAY IUD, 1 each by Intrauterine route once.,  Disp: , Rfl:    lidocaine-prilocaine (EMLA) cream, Apply to affected area once AS DIRECTED, Disp: 30 g, Rfl: 3   potassium chloride (MICRO-K) 10 MEQ CR capsule, Take 40 mEq by mouth daily., Disp: , Rfl:    rosuvastatin (CRESTOR) 40 MG tablet, Take 1 tablet (40 mg total) by mouth daily., Disp: 90 tablet, Rfl: 3   Spacer/Aero-Holding Chambers (AEROCHAMBER PLUS) Device, Please dispense spacer for inhaler use, Disp: 1 each, Rfl: 0   spironolactone (ALDACTONE) 25 MG tablet, Take 1 tablet (25 mg total) by mouth daily., Disp: 30 tablet, Rfl: 6   valsartan (DIOVAN) 160 MG tablet, Take 1 tablet (160 mg total) by mouth daily., Disp: 30 tablet, Rfl: 11   venlafaxine XR (EFFEXOR-XR) 37.5 MG 24 hr capsule, Take 37.5 mg by mouth every morning., Disp: , Rfl:  No current facility-administered medications for this visit.  Facility-Administered Medications Ordered in Other Visits:    heparin lock flush 100 unit/mL, 500 Units, Intracatheter, Once, Gudena, Vinay, MD   sodium chloride flush (NS) 0.9 % injection 10 mL, 10 mL, Intracatheter, Once, Serena Croissant, MD   Allergies  Allergen Reactions   Pollen Extract Shortness Of Breath   Compazine [Prochlorperazine] Anxiety    IV compazine causes severe anxiety attack   Latex Rash   Codeine Hives   Diflucan [Fluconazole] Hives   Shellfish Allergy Hives   Betadine [Povidone Iodine] Rash     Review of Systems  Constitutional: Negative.   Respiratory:  Positive for cough.   Cardiovascular: Negative.   Gastrointestinal: Negative.   Neurological: Negative.   Psychiatric/Behavioral: Negative.       Today's Vitals   05/09/23 1621  BP: 118/74  Pulse: 88  Temp: 98.6 F (37 C)  SpO2: 98%  Weight: (!) 313 lb 12.8 oz (142.3 kg)  Height: 5\' 6"  (1.676 m)   Body mass index is 50.65 kg/m.  Wt Readings from Last 3 Encounters:  05/15/23 (!) 314 lb 1.6 oz (142.5 kg)  05/09/23 (!) 313 lb 12.8 oz (142.3 kg)  05/03/23 (!) 336 lb 3.2 oz (152.5 kg)     Objective:   Physical Exam Vitals and nursing note reviewed.  Constitutional:      Appearance: Normal appearance. She is obese.  HENT:     Head: Normocephalic and atraumatic.  Eyes:     Extraocular Movements: Extraocular movements intact.  Cardiovascular:     Rate and Rhythm: Normal rate and regular rhythm.     Heart sounds: Normal heart sounds.  Pulmonary:     Effort: Pulmonary effort is normal.     Breath sounds: Normal breath sounds.  Musculoskeletal:     Cervical back: Normal range of motion.  Skin:    General: Skin is warm.  Neurological:     General: No focal deficit present.     Mental Status: She is alert.  Psychiatric:        Mood and Affect: Mood normal.        Behavior: Behavior normal.         Assessment And Plan:  Community acquired pneumonia, unspecified laterality Assessment & Plan: TCM PERFORMED. A MEMBER OF THE CLINICAL TEAM SPOKE WITH THE PATIENT UPON DISCHARGE. DISCHARGE SUMMARY WAS REVIEWED IN FULL DETAIL DURING THE VISIT. MEDS RECONCILED AND COMPARED TO DISCHARGE MEDS. MEDICATION LIST WAS UPDATED AND REVIEWED WITH THE PATIENT. GREATER THAN 50% FACE TO FACE TIME WAS SPENT IN COUNSELING AND COORDINATION OF CARE. ALL QUESTIONS WERE ANSWERED TO THE SATISFACTION OF THE PATIENT. She is encouraged to stay well hydrated and avoid dairy until her symptoms have fully resolved. She has completed treatment with Rocephin, azithromycin and prednisone.     Hypertensive heart disease without heart failure Assessment & Plan: Chronic, well controlled.  She will continue with chlorthalidone 25mg  daily, furosemide as needed, spironolactone 25mg  daily and valsartan 160mg  daily. She is encouraged to follow low sodium diet.    Aortic atherosclerosis (HCC) Assessment & Plan: Chronic, LDL goal is less than 70.  She will continue with rosuvastatin daily.    Transaminitis Assessment & Plan: Noted during hospitalization. She will have labs drawn next week at Oncology. I will not repeat  labs today.    Hypokalemia Assessment & Plan: Noted during hospitalization. She will have labs drawn next week at Oncology. I will not repeat labs today.    Malignant neoplasm of lower-outer quadrant of right breast of female, estrogen receptor positive (HCC) Assessment & Plan: Chronic, initially s/p R lumpectomy, chemo and XRT.  Diagnosed with metastatic breast cancer in 2023. Currently being treated with Enhurtu.    Class 3 severe obesity due to excess calories with serious comorbidity and body mass index (BMI) of 50.0 to 59.9 in adult [facility]) Assessment & Plan: BMI 50.  She is encouraged to avoid processed foods and to increase activity as tolerated.    Work-related stress -     Ambulatory referral to Psychiatry  She is encouraged to strive for BMI less than 30 to decrease cardiac risk. Advised to aim for at least 150 minutes of exercise per week.    Return if symptoms worsen or fail to improve.  Patient was given opportunity to ask questions. Patient verbalized understanding of the plan and was able to repeat key elements of the plan. All questions were answered to their satisfaction.    I, Pamela Aliment, MD, have reviewed all documentation for this visit. The documentation on 05/09/23 for the exam, diagnosis, procedures, and orders are all accurate and complete.   IF YOU HAVE BEEN REFERRED TO A SPECIALIST, IT MAY TAKE 1-2 WEEKS TO SCHEDULE/PROCESS THE REFERRAL. IF YOU HAVE NOT HEARD FROM US/SPECIALIST IN TWO WEEKS, PLEASE GIVE [place] A CALL AT [phone].   THE PATIENT IS ENCOURAGED TO PRACTICE SOCIAL DISTANCING DUE TO THE COVID-19 PANDEMIC.

## 2023-05-09 NOTE — Assessment & Plan Note (Signed)
 Chronic, well controlled.  She will continue with chlorthalidone 25mg  daily, furosemide as needed, spironolactone 25mg  daily and valsartan 160mg  daily. She is encouraged to follow low sodium diet.

## 2023-05-09 NOTE — Assessment & Plan Note (Addendum)
 TCM PERFORMED. A MEMBER OF THE CLINICAL TEAM SPOKE WITH THE PATIENT UPON DISCHARGE. DISCHARGE SUMMARY WAS REVIEWED IN FULL DETAIL DURING THE VISIT. MEDS RECONCILED AND COMPARED TO DISCHARGE MEDS. MEDICATION LIST WAS UPDATED AND REVIEWED WITH THE PATIENT. GREATER THAN 50% FACE TO FACE TIME WAS SPENT IN COUNSELING AND COORDINATION OF CARE. ALL QUESTIONS WERE ANSWERED TO THE SATISFACTION OF THE PATIENT. She is encouraged to stay well hydrated and avoid dairy until her symptoms have fully resolved. She has completed treatment with Rocephin, azithromycin and prednisone.

## 2023-05-09 NOTE — Patient Instructions (Signed)
 [facility]  Stress, Adult Stress is a normal reaction to life events. Stress is what you feel when life demands more than you are used to, or more than you think you can handle. Some stress can be useful, such as studying for a test or meeting a deadline at work. Stress that occurs too often or for too long can cause problems. Long-lasting stress is called chronic stress. Chronic stress can affect your emotional health and interfere with relationships and normal daily activities. Too much stress can weaken your body's defense system (immune system) and increase your risk for physical illness. If you already have a medical problem, stress can make it worse. What are the causes? All sorts of life events can cause stress. An event that causes stress for one person may not be stressful for someone else. Major life events, whether positive or negative, commonly cause stress. Examples include: Losing a job or starting a new job. Losing a loved one. Moving to a new town or home. Getting married or divorced. Having a baby. Getting injured or sick. Less obvious life events can also cause stress, especially if they occur day after day or in combination with each other. Examples include: Working long hours. Driving in traffic. Caring for children. Being in debt. Being in a difficult relationship. What are the signs or symptoms? Stress can cause emotional and physical symptoms and can lead to unhealthy behaviors. These include the following: Emotional symptoms Anxiety. This is feeling worried, afraid, on edge, overwhelmed, or out of control. Anger, including irritation or impatience. Depression. This is feeling sad, down, helpless, or guilty. Trouble focusing, remembering, or making decisions. Physical symptoms Aches and pains. These may affect your head, neck, back, stomach, or other areas of your body. Tight muscles or a clenched jaw. Low energy. Trouble sleeping. Unhealthy  behaviors Eating to feel better (overeating) or skipping meals. Working too much or putting off tasks. Smoking, drinking alcohol, or using drugs to feel better. How is this diagnosed? A stress disorder is diagnosed through an assessment by your health care provider. A stress disorder may be diagnosed based on: Your symptoms and any stressful life events. Your medical history. Tests to rule out other causes of your symptoms. Depending on your condition, your health care provider may refer you to a specialist for further evaluation. How is this treated?  Stress management techniques are the recommended treatment for stress. Medicine is not typically recommended for treating stress. Techniques to reduce your reaction to stressful life events include: Identifying stress. Monitor yourself for symptoms of stress and notice what causes stress for you. These skills may help you to avoid or prepare for stressful events. Managing time. Set your priorities, keep a calendar of events, and learn to say no. These actions can help you avoid taking on too much. Techniques for dealing with stress include: Rethinking the problem. Try to think realistically about stressful events rather than ignoring them or overreacting. Try to find the positives in a stressful situation rather than focusing on the negatives. Exercise. Physical exercise can release both physical and emotional tension. The key is to find a form of exercise that you enjoy and do it regularly. Relaxation techniques. These relax the body and mind. Find one or more that you enjoy and use the techniques regularly. Examples include: Meditation, deep breathing, or progressive relaxation techniques. Yoga or tai chi. Biofeedback, mindfulness techniques, or journaling. Listening to music, being in nature, or taking part in other hobbies. Practicing a healthy lifestyle.  Eat a balanced diet, drink plenty of water, limit or avoid caffeine, and get plenty of  sleep. Having a strong support network. Spend time with family, friends, or other people you enjoy being around. Express your feelings and talk things over with someone you trust. Counseling or talk therapy with a mental health provider may help if you are having trouble managing stress by yourself. Follow these instructions at home: Lifestyle  Avoid drugs. Do not use any products that contain nicotine or tobacco. These products include cigarettes, chewing tobacco, and vaping devices, such as e-cigarettes. If you need help quitting, ask your health care provider. If you drink alcohol: Limit how much you have to: 0-1 drink a day for women who are not pregnant. 0-2 drinks a day for men. Know how much alcohol is in a drink. In the [place] one drink equals one 12 oz bottle of beer (355 mL), one 5 oz glass of wine (148 mL), or one 1 oz glass of hard liquor (44 mL). Do not use alcohol or drugs to relax. Eat a balanced diet that includes fresh fruits and vegetables, whole grains, lean meats, fish, eggs, beans, and low-fat dairy. Avoid processed foods and foods high in added fat, sugar, and salt. Exercise at least 30 minutes on 5 or more days each week. Get 7-8 hours of sleep each night. General instructions  Practice stress management techniques as told by your health care provider. Drink enough fluid to keep your urine pale yellow. Take over-the-counter and prescription medicines only as told by your health care provider. Keep all follow-up visits. This is important. Contact a health care provider if: Your symptoms get worse. You have new symptoms. You feel overwhelmed by your problems and can no longer manage them by yourself. Get help right away if: You have thoughts of hurting yourself or others. Get help right awayif you feel like you may hurt yourself or others, or have thoughts about taking your own life. Go to your nearest emergency room or: Call 911. Call the National Suicide  Prevention Lifeline at 315-079-2511 or 988. This is open 24 hours a day. Text the Crisis Text Line at (937)613-1513. Summary Stress is a normal reaction to life events. It can cause problems if it happens too often or for too long. Practicing stress management techniques is the best way to treat stress. Counseling or talk therapy with a mental health provider may help if you are having trouble managing stress by yourself. This information is not intended to replace advice given to you by your health care provider. Make sure you discuss any questions you have with your health care provider. Document Revised: 09/29/2020 Document Reviewed: 09/29/2020 Elsevier Patient Education  2024 [employer].

## 2023-05-13 ENCOUNTER — Encounter: Payer: Self-pay | Admitting: Internal Medicine

## 2023-05-14 MED FILL — Fosaprepitant Dimeglumine For IV Infusion 150 MG (Base Eq): INTRAVENOUS | Qty: 5 | Status: AC

## 2023-05-15 ENCOUNTER — Inpatient Hospital Stay: Payer: 59 | Attending: Hematology and Oncology

## 2023-05-15 ENCOUNTER — Telehealth: Payer: Self-pay

## 2023-05-15 ENCOUNTER — Inpatient Hospital Stay: Payer: 59

## 2023-05-15 ENCOUNTER — Inpatient Hospital Stay: Payer: 59 | Admitting: Adult Health

## 2023-05-15 ENCOUNTER — Encounter: Payer: Self-pay | Admitting: Adult Health

## 2023-05-15 VITALS — BP 150/75 | HR 80 | Temp 97.3°F | Resp 18 | Ht 66.0 in | Wt 314.1 lb

## 2023-05-15 DIAGNOSIS — Z9221 Personal history of antineoplastic chemotherapy: Secondary | ICD-10-CM | POA: Diagnosis not present

## 2023-05-15 DIAGNOSIS — Z95828 Presence of other vascular implants and grafts: Secondary | ICD-10-CM

## 2023-05-15 DIAGNOSIS — Z8349 Family history of other endocrine, nutritional and metabolic diseases: Secondary | ICD-10-CM | POA: Diagnosis not present

## 2023-05-15 DIAGNOSIS — Z825 Family history of asthma and other chronic lower respiratory diseases: Secondary | ICD-10-CM | POA: Insufficient documentation

## 2023-05-15 DIAGNOSIS — Z83438 Family history of other disorder of lipoprotein metabolism and other lipidemia: Secondary | ICD-10-CM | POA: Diagnosis not present

## 2023-05-15 DIAGNOSIS — Z5986 Financial insecurity: Secondary | ICD-10-CM | POA: Diagnosis not present

## 2023-05-15 DIAGNOSIS — Z5112 Encounter for antineoplastic immunotherapy: Secondary | ICD-10-CM | POA: Diagnosis present

## 2023-05-15 DIAGNOSIS — C50919 Malignant neoplasm of unspecified site of unspecified female breast: Secondary | ICD-10-CM

## 2023-05-15 DIAGNOSIS — Z8249 Family history of ischemic heart disease and other diseases of the circulatory system: Secondary | ICD-10-CM | POA: Insufficient documentation

## 2023-05-15 DIAGNOSIS — I700 Atherosclerosis of aorta: Secondary | ICD-10-CM | POA: Diagnosis not present

## 2023-05-15 DIAGNOSIS — T451X5A Adverse effect of antineoplastic and immunosuppressive drugs, initial encounter: Secondary | ICD-10-CM | POA: Insufficient documentation

## 2023-05-15 DIAGNOSIS — R110 Nausea: Secondary | ICD-10-CM

## 2023-05-15 DIAGNOSIS — D696 Thrombocytopenia, unspecified: Secondary | ICD-10-CM | POA: Diagnosis not present

## 2023-05-15 DIAGNOSIS — I427 Cardiomyopathy due to drug and external agent: Secondary | ICD-10-CM | POA: Insufficient documentation

## 2023-05-15 DIAGNOSIS — Z801 Family history of malignant neoplasm of trachea, bronchus and lung: Secondary | ICD-10-CM | POA: Insufficient documentation

## 2023-05-15 DIAGNOSIS — Z833 Family history of diabetes mellitus: Secondary | ICD-10-CM | POA: Insufficient documentation

## 2023-05-15 DIAGNOSIS — R7989 Other specified abnormal findings of blood chemistry: Secondary | ICD-10-CM | POA: Diagnosis not present

## 2023-05-15 DIAGNOSIS — C50511 Malignant neoplasm of lower-outer quadrant of right female breast: Secondary | ICD-10-CM

## 2023-05-15 DIAGNOSIS — Z808 Family history of malignant neoplasm of other organs or systems: Secondary | ICD-10-CM | POA: Diagnosis not present

## 2023-05-15 DIAGNOSIS — Z803 Family history of malignant neoplasm of breast: Secondary | ICD-10-CM | POA: Insufficient documentation

## 2023-05-15 DIAGNOSIS — Z79899 Other long term (current) drug therapy: Secondary | ICD-10-CM | POA: Diagnosis not present

## 2023-05-15 DIAGNOSIS — N6315 Unspecified lump in the right breast, overlapping quadrants: Secondary | ICD-10-CM | POA: Diagnosis not present

## 2023-05-15 DIAGNOSIS — Z170 Estrogen receptor positive status [ER+]: Secondary | ICD-10-CM | POA: Diagnosis not present

## 2023-05-15 DIAGNOSIS — Z1722 Progesterone receptor negative status: Secondary | ICD-10-CM | POA: Diagnosis not present

## 2023-05-15 LAB — CBC WITH DIFFERENTIAL (CANCER CENTER ONLY)
Abs Immature Granulocytes: 0.01 10*3/uL (ref 0.00–0.07)
Basophils Absolute: 0 10*3/uL (ref 0.0–0.1)
Basophils Relative: 1 %
Eosinophils Absolute: 0.3 10*3/uL (ref 0.0–0.5)
Eosinophils Relative: 7 %
HCT: 35.2 % — ABNORMAL LOW (ref 36.0–46.0)
Hemoglobin: 11.5 g/dL — ABNORMAL LOW (ref 12.0–15.0)
Immature Granulocytes: 0 %
Lymphocytes Relative: 19 %
Lymphs Abs: 0.9 10*3/uL (ref 0.7–4.0)
MCH: 27.7 pg (ref 26.0–34.0)
MCHC: 32.7 g/dL (ref 30.0–36.0)
MCV: 84.8 fL (ref 80.0–100.0)
Monocytes Absolute: 0.8 10*3/uL (ref 0.1–1.0)
Monocytes Relative: 17 %
Neutro Abs: 2.6 10*3/uL (ref 1.7–7.7)
Neutrophils Relative %: 56 %
Platelet Count: 128 10*3/uL — ABNORMAL LOW (ref 150–400)
RBC: 4.15 MIL/uL (ref 3.87–5.11)
RDW: 17.5 % — ABNORMAL HIGH (ref 11.5–15.5)
WBC Count: 4.5 10*3/uL (ref 4.0–10.5)
nRBC: 0 % (ref 0.0–0.2)

## 2023-05-15 LAB — CMP (CANCER CENTER ONLY)
ALT: 46 U/L — ABNORMAL HIGH (ref 0–44)
AST: 55 U/L — ABNORMAL HIGH (ref 15–41)
Albumin: 3.4 g/dL — ABNORMAL LOW (ref 3.5–5.0)
Alkaline Phosphatase: 100 U/L (ref 38–126)
Anion gap: 5 (ref 5–15)
BUN: 10 mg/dL (ref 6–20)
CO2: 28 mmol/L (ref 22–32)
Calcium: 8.8 mg/dL — ABNORMAL LOW (ref 8.9–10.3)
Chloride: 107 mmol/L (ref 98–111)
Creatinine: 0.71 mg/dL (ref 0.44–1.00)
GFR, Estimated: >60 mL/min (ref >60)
Glucose, Bld: 128 mg/dL — ABNORMAL HIGH (ref 70–99)
Potassium: 3.7 mmol/L (ref 3.5–5.1)
Sodium: 140 mmol/L (ref 135–145)
Total Bilirubin: 1.2 mg/dL (ref 0.0–1.2)
Total Protein: 6.3 g/dL — ABNORMAL LOW (ref 6.5–8.1)

## 2023-05-15 MED ORDER — FAM-TRASTUZUMAB DERUXTECAN-NXKI CHEMO 100 MG IV SOLR
300.0000 mg | Freq: Once | INTRAVENOUS | Status: AC
  Administered 2023-05-15: 300 mg via INTRAVENOUS
  Filled 2023-05-15: qty 15

## 2023-05-15 MED ORDER — DEXTROSE 5 % IV SOLN: Freq: Once | INTRAVENOUS | Status: AC

## 2023-05-15 MED ORDER — POTASSIUM CHLORIDE 10 MEQ/100ML IV SOLN
10.0000 meq | Freq: Once | INTRAVENOUS | Status: AC
  Administered 2023-05-15: 10 meq via INTRAVENOUS
  Filled 2023-05-15: qty 100

## 2023-05-15 MED ORDER — ACETAMINOPHEN 325 MG PO TABS
650.0000 mg | ORAL_TABLET | Freq: Once | ORAL | Status: AC
  Administered 2023-05-15: 650 mg via ORAL
  Filled 2023-05-15: qty 2

## 2023-05-15 MED ORDER — SODIUM CHLORIDE 0.9% FLUSH
10.0000 mL | INTRAVENOUS | Status: DC | PRN
Start: 2023-05-15 — End: 2023-05-15
  Administered 2023-05-15: 10 mL

## 2023-05-15 MED ORDER — SODIUM CHLORIDE 0.9 % IV SOLN: Freq: Once | INTRAVENOUS | Status: DC

## 2023-05-15 MED ORDER — SODIUM CHLORIDE 0.9 % IV SOLN: INTRAVENOUS | Status: AC

## 2023-05-15 MED ORDER — SODIUM CHLORIDE 0.9% FLUSH
10.0000 mL | Freq: Once | INTRAVENOUS | Status: AC
  Administered 2023-05-15: 10 mL

## 2023-05-15 MED ORDER — HEPARIN SOD (PORK) LOCK FLUSH 100 UNIT/ML IV SOLN
500.0000 [IU] | Freq: Once | INTRAVENOUS | Status: AC | PRN
  Administered 2023-05-15: 500 [IU]

## 2023-05-15 MED ORDER — PALONOSETRON HCL INJECTION 0.25 MG/5ML
0.2500 mg | Freq: Once | INTRAVENOUS | Status: AC
  Administered 2023-05-15: 0.25 mg via INTRAVENOUS
  Filled 2023-05-15: qty 5

## 2023-05-15 MED ORDER — CYANOCOBALAMIN 1000 MCG/ML IJ SOLN
1000.0000 ug | Freq: Once | INTRAMUSCULAR | Status: AC
  Administered 2023-05-15: 1000 ug via INTRAMUSCULAR
  Filled 2023-05-15: qty 1

## 2023-05-15 MED ORDER — SODIUM CHLORIDE 0.9 % IV SOLN
150.0000 mg | Freq: Once | INTRAVENOUS | Status: AC
  Administered 2023-05-15: 150 mg via INTRAVENOUS
  Filled 2023-05-15: qty 150

## 2023-05-15 NOTE — Telephone Encounter (Signed)
 Notified Patient of completion of FMLA and Disability Forms and Return to Work Form. Fax transmission confirmation received. Copy of forms mailed to Patient as requested. No other needs or concerns noted at this time.

## 2023-05-15 NOTE — Progress Notes (Unsigned)
 [facility] Cancer Follow up:    Pamela Peng, MD [address] [place] [address]   DIAGNOSIS: Cancer Staging  Malignant neoplasm of lower-outer quadrant of right breast of female, estrogen receptor positive (HCC) Staging form: Breast, AJCC 8th Edition - Clinical stage from 05/11/2020: Stage IA (cT1b, cN0, cM0, G3, ER+, PR-, HER2+) - Signed by Serena Croissant, MD on 05/11/2020 Stage prefix: Initial diagnosis - Pathologic stage from 06/02/2020: Stage IA (pT1c, pN0, cM0, G3, ER+, PR-, HER2+) - Signed by Loa Socks, NP on 06/15/2020 Stage prefix: Initial diagnosis Histologic grading system: 3 grade system   SUMMARY OF ONCOLOGIC HISTORY: Oncology History  Malignant neoplasm of lower-outer quadrant of right breast of female, estrogen receptor positive (HCC)  05/03/2020 Initial Diagnosis   Screening mammogram showed a 1.0cm mass at the 6 o'clock position in the right breast. Diagnostic mammogram and US showed the 1.2cm mass at the 6 o'clock position in the right breast. Biopsy showed IDC, grade 3, HER-2 positive (3+), ER 15% weak, PR-, Ki67 80%.   05/19/2020 Genetic Testing   Negative hereditary cancer genetic testing: no pathogenic variants detected in [facility] Breast STAT Panel and Common Hereditary Cancers Panel.  The report dates are May 19, 2020 (STAT) and May 23, 2020 (Common Hereditary).   The Common Hereditary Cancers Panel offered by [facility] includes sequencing and/or deletion duplication testing of the following 47 genes: APC, ATM, AXIN2, BARD1, BMPR1A, BRCA1, BRCA2, BRIP1, CDH1, CDK4, CDKN2A (p14ARF), CDKN2A (p16INK4a), CHEK2, CTNNA1, DICER1, EPCAM (Deletion/duplication testing only), GREM1 (promoter region deletion/duplication testing only), GREM1, HOXB13, KIT, MEN1, MLH1, MSH2, MSH3, MSH6, MUTYH, NBN, NF1, NHTL1, PALB2, PDGFRA, PMS2, POLD1, POLE, PTEN, RAD50, RAD51C, RAD51D, SDHA, SDHB, SDHC, SDHD, SMAD4, SMARCA4. STK11, TP53, TSC1, TSC2, and  VHL.  The following genes were evaluated for sequence changes only: SDHA and HOXB13 c.251G>A variant only.es only: SDHA and HOXB13 c.251G>A variant only.   06/02/2020 Surgery   Right lumpectomy ([facility]): invasive and in situ ductal carcinoma, 1.3cm, clear margins, 1 right axillary lymph node negative for carcinoma.   06/02/2020 Cancer Staging   Staging form: Breast, AJCC 8th Edition - Pathologic stage from 06/02/2020: Stage IA (pT1c, pN0, cM0, G3, ER+, PR-, HER2+) - Signed by Loa Socks, NP on 06/15/2020 Stage prefix: Initial diagnosis Histologic grading system: 3 grade system   07/08/2020 - 10/21/2020 Chemotherapy   Taxol Herceptin followed by Herceptin maintenance   04/14/2021 -  Anti-estrogen oral therapy   Tamoxifen 10 mg   07/13/2021 Imaging   CT angiogram: Right supraclavicular lymph node 5 cm, right internal mammary lymph nodes 1.8 cm    08/06/2021 PET scan   PET CT scan: Ext Right Supra clav LN along with Internal mammary and Rt Upper Mediastinal and Rt Axillary LN   08/2021 Initial Biopsy   Right supraclavicular lymphadenopathy: Biopsy: Metastatic poorly differentiated adenocarcinoma, ER +40%, PR negative, HER2 positive 3+    08/21/2021 -  Chemotherapy   Enhertu every 3 weeks    03/08/2022 Imaging   CT chest/abdomen/pelvis  1. No new or progressive findings in the chest, abdomen or pelvis. 2. Signs of RIGHT breast lumpectomy with areas of central fat necrosis unchanged from previous imaging. 3. Stable small lymph nodes throughout the chest, largest with area of soft tissue about surgical clips in the RIGHT axilla. 4. Stable appearance of thoracic inlet lymph nodes, not shown to be hypermetabolic on prior PET imaging. 5. IUD in-situ. 6. Aortic atherosclerosis.   08/21/2022 Imaging   Echocardiogram  -  normal LVEF at 60-65%. -no left ventricular wall motion abnormalities  -normal left ventricular diastolic function  -mild left ventricular hypertrophy.    10/05/2022  Imaging   CT Chest, Abdomen, and Pelvis with contrast IMPRESSION: 1. Stable examination without new or progressive finding in the chest, abdomen or pelvis to suggest recurrent or metastatic disease. 2. Stable tiny bilateral thoracic inlet lymph nodes. 3.  Aortic Atherosclerosis (ICD10-I70.0).     CURRENT THERAPY: Enhertu  INTERVAL HISTORY: Pamela [name] 58 y.o. female returns for    Patient Active Problem List   Diagnosis Date Noted  . Hypertensive heart disease without heart failure 05/09/2023  . Transaminitis 05/09/2023  . CAP (community acquired pneumonia) 04/30/2023  . Hypophosphatemia 04/30/2023  . Thrombocytopenia (HCC) 04/30/2023  . Elevated troponin 04/30/2023  . Normocytic anemia 04/30/2023  . Grade I diastolic dysfunction 04/30/2023  . Prolonged QT interval 04/30/2023  . Drug-induced constipation 11/24/2022  . Chemotherapy induced cardiomyopathy (HCC) 05/08/2022  . Aortic atherosclerosis (HCC) 04/26/2022  . Acute cough 01/15/2022  . Hypomagnesemia 11/26/2021  . Hypokalemia 11/26/2021  . Dizziness, fatigue, globus sensation 11/26/2021  . Breast cancer (HCC) 08/25/2021  . Nausea without vomiting 08/25/2021  . Prediabetes 03/13/2021  . Non-intractable vomiting 07/13/2020  . Port-A-Cath in place 07/08/2020  . Genetic testing 05/20/2020  . Family history of breast cancer 05/12/2020  . Family history of lung cancer 05/12/2020  . Malignant neoplasm of lower-outer quadrant of right breast of female, estrogen receptor positive (HCC) 05/03/2020  . Elevated troponin level not due myocardial infarction 09/18/2019  . Mixed hyperlipidemia 09/18/2019  . Hypertensive emergency 09/17/2019  . Primary osteoarthritis of right hip 08/06/2018  . Seasonal allergies 06/12/2018  . Fibrocystic disease of breast 05/27/2018  . Obstructive sleep apnea syndrome, moderate 10/27/2013  . Shift work sleep disorder 10/27/2013  . Psychic factors associated with diseases classified  elsewhere 08/07/2013  . Pure hypercholesterolemia 07/21/2013  . Essential hypertension 07/21/2013  . Snoring 07/21/2013  . IUD contraception-inserted 10/01/11 10/01/2011  . Class 3 severe obesity due to excess calories with serious comorbidity and body mass index (BMI) of 40.0 to 44.9 in adult [facility]) 09/03/2011    is allergic to pollen extract, compazine [prochlorperazine], latex, codeine, diflucan [fluconazole], shellfish allergy, and betadine [povidone iodine].  MEDICAL HISTORY: Past Medical History:  Diagnosis Date  . Anxiety   . Asthma   . Back pain   . Breast cancer (HCC)   . Constipation   . DUB (dysfunctional uterine bleeding) 2007  . Edema of both lower extremities   . Elevated cholesterol   . Family history of breast cancer 05/12/2020  . Family history of lung cancer 05/12/2020  . Food allergy   . Grade I diastolic dysfunction 04/30/2023  . H/O menorrhagia 05/01/2006  . High cholesterol   . History of ovarian cyst 2007  . Hypertension 03/31/2005  . Increased BMI 07/11/2006  . Joint pain   . Migraine   . N&V (nausea and vomiting)   . Obesity 2007  . Obstructive sleep apnea syndrome, moderate 10/27/2013   no cpap  . Personal history of chemotherapy   . Personal history of radiation therapy   . Sleep apnea   . SOB (shortness of breath)   . Vitamin D deficiency   . Vitamin D deficiency   . Weight loss 07/11/2006    SURGICAL HISTORY: Past Surgical History:  Procedure Laterality Date  . BREAST LUMPECTOMY    . BREAST LUMPECTOMY WITH RADIOACTIVE SEED AND SENTINEL LYMPH NODE BIOPSY Right  06/02/2020   Procedure: RIGHT BREAST LUMPECTOMY WITH RADIOACTIVE SEED AND RIGHT SENTINEL LYMPH NODE BIOPSY;  Surgeon: Harriette Bouillon, MD;  Location: [facility];  Service: General;  Laterality: Right;  . BREAST REDUCTION SURGERY  05/04/1991  . CESAREAN SECTION    . HYSTEROSCOPY  05/01/2006  . PORTACATH PLACEMENT Right 06/02/2020   Procedure: INSERTION PORT-A-CATH;   Surgeon: Harriette Bouillon, MD;  Location: [facility];  Service: General;  Laterality: Right;  . REDUCTION MAMMAPLASTY      SOCIAL HISTORY: Social History   Socioeconomic History  . Marital status: Single    Spouse name: Not on file  . Number of children: Not on file  . Years of education: Not on file  . Highest education level: Not on file  Occupational History  . Occupation: Child psychotherapist  Tobacco Use  . Smoking status: Never  . Smokeless tobacco: Never  Vaping Use  . Vaping status: Never Used  Substance and Sexual Activity  . Alcohol use: No  . Drug use: Not Currently    Frequency: 2.0 times per week  . Sexual activity: Not Currently    Birth control/protection: I.U.D.    Comment: Mirena  Other Topics Concern  . Not on file  Social History Narrative  . Not on file   Social Drivers of Health   Financial Resource Strain: High Risk (03/14/2022)   Overall Financial Resource Strain (CARDIA)   . Difficulty of Paying Living Expenses: Hard  Food Insecurity: No Food Insecurity (04/30/2023)   Hunger Vital Sign   . Worried About Programme researcher, broadcasting/film/video in the Last Year: Never true   . Ran Out of Food in the Last Year: Never true  Transportation Needs: No Transportation Needs (04/30/2023)   PRAPARE - Transportation   . Lack of Transportation (Medical): No   . Lack of Transportation (Non-Medical): No  Physical Activity: Not on file  Stress: Not on file  Social Connections: Moderately Isolated (04/30/2023)   Social Connection and Isolation Panel [NHANES]   . Frequency of Communication with Friends and Family: More than three times a week   . Frequency of Social Gatherings with Friends and Family: More than three times a week   . Attends Religious Services: More than 4 times per year   . Active Member of Clubs or Organizations: No   . Attends Banker Meetings: Not on file   . Marital Status: Separated  Intimate Partner Violence: Not At Risk (04/30/2023)    Humiliation, Afraid, Rape, and Kick questionnaire   . Fear of Current or Ex-Partner: No   . Emotionally Abused: No   . Physically Abused: No   . Sexually Abused: No    FAMILY HISTORY: Family History  Problem Relation Age of Onset  . Hypertension Mother   . High Cholesterol Mother   . Thyroid disease Mother   . Cancer Mother   . Sleep apnea Mother   . Hypertension Father   . Heart attack Father   . Sudden death Father   . Diabetes Maternal Grandmother   . Bone cancer Maternal Grandfather        dx after 50  . Breast cancer Maternal Aunt        dx 30s  . Lung cancer Maternal Aunt        dx after 50    Review of Systems - Oncology    PHYSICAL EXAMINATION    There were no vitals filed for this visit.  Physical Exam  LABORATORY DATA:  CBC    Component Value Date/Time   WBC 6.5 05/03/2023 0522   RBC 3.65 (L) 05/03/2023 0522   HGB 10.2 (L) 05/03/2023 0522   HGB 11.9 (L) 04/17/2023 1100   HGB 13.2 09/22/2019 1040   HCT 32.7 (L) 05/03/2023 0522   HCT 41.3 09/22/2019 1040   PLT 130 (L) 05/03/2023 0522   PLT 169 04/17/2023 1100   PLT 247 09/22/2019 1040   MCV 89.6 05/03/2023 0522   MCV 83 09/22/2019 1040   MCH 27.9 05/03/2023 0522   MCHC 31.2 05/03/2023 0522   RDW 17.7 (H) 05/03/2023 0522   RDW 14.7 09/22/2019 1040   LYMPHSABS 1.2 05/03/2023 0522   MONOABS 0.9 05/03/2023 0522   EOSABS 0.1 05/03/2023 0522   BASOSABS 0.0 05/03/2023 0522    CMP     Component Value Date/Time   NA 139 05/04/2023 0500   NA 142 09/22/2019 1040   K 3.4 (L) 05/04/2023 0500   CL 104 05/04/2023 0500   CO2 31 05/04/2023 0500   GLUCOSE 72 05/04/2023 0500   BUN 13 05/04/2023 0500   BUN 11 09/22/2019 1040   CREATININE 0.54 05/04/2023 0500   CREATININE 0.67 04/17/2023 1100   CALCIUM 8.6 (L) 05/04/2023 0500   PROT 5.9 (L) 05/01/2023 0508   PROT 6.7 06/17/2019 1054   ALBUMIN 2.8 (L) 05/01/2023 0508   ALBUMIN 4.5 06/17/2019 1054   AST 57 (H) 05/01/2023 0508   AST 37 04/17/2023  1100   ALT 34 05/01/2023 0508   ALT 24 04/17/2023 1100   ALKPHOS 88 05/01/2023 0508   BILITOT 1.6 (H) 05/01/2023 0508   BILITOT 1.9 (H) 04/17/2023 1100   GFRNONAA >60 05/04/2023 0500   GFRNONAA >60 04/17/2023 1100   GFRAA 77 09/22/2019 1040       PENDING LABS:   RADIOGRAPHIC STUDIES:  No results found.   PATHOLOGY:     ASSESSMENT and THERAPY PLAN:   No problem-specific Assessment & Plan notes found for this encounter.   No orders of the defined types were placed in this encounter.   All questions were answered. The patient knows to call the clinic with any problems, questions or concerns. We can certainly see the patient much sooner if necessary. This note was electronically signed. Noreene Filbert, NP 05/15/2023

## 2023-05-15 NOTE — Patient Instructions (Signed)
 [facility] - A DEPT OF [facility]  Discharge Instructions: Thank you for choosing [facility] to provide your oncology and hematology care.   If you have a lab appointment with the Cancer Center, please go directly to the Cancer Center and check in at the registration area.   Wear comfortable clothing and clothing appropriate for easy access to any Portacath or PICC line.   We strive to give you quality time with your provider. You may need to reschedule your appointment if you arrive late (15 or more minutes).  Arriving late affects you and other patients whose appointments are after yours.  Also, if you miss three or more appointments without notifying the office, you may be dismissed from the clinic at the provider's discretion.      For prescription refill requests, have your pharmacy contact our office and allow 72 hours for refills to be completed.    Today you received the following chemotherapy and/or immunotherapy agents Enhertu      To help prevent nausea and vomiting after your treatment, we encourage you to take your nausea medication as directed.  BELOW ARE SYMPTOMS THAT SHOULD BE REPORTED IMMEDIATELY: *FEVER GREATER THAN 100.4 F (38 C) OR HIGHER *CHILLS OR SWEATING *NAUSEA AND VOMITING THAT IS NOT CONTROLLED WITH YOUR NAUSEA MEDICATION *UNUSUAL SHORTNESS OF BREATH *UNUSUAL BRUISING OR BLEEDING *URINARY PROBLEMS (pain or burning when urinating, or frequent urination) *BOWEL PROBLEMS (unusual diarrhea, constipation, pain near the anus) TENDERNESS IN MOUTH AND THROAT WITH OR WITHOUT PRESENCE OF ULCERS (sore throat, sores in mouth, or a toothache) UNUSUAL RASH, SWELLING OR PAIN  UNUSUAL VAGINAL DISCHARGE OR ITCHING   Items with * indicate a potential emergency and should be followed up as soon as possible or go to the Emergency Department if any problems should occur.  Please show the CHEMOTHERAPY ALERT CARD or IMMUNOTHERAPY  ALERT CARD at check-in to the Emergency Department and triage nurse.  Should you have questions after your visit or need to cancel or reschedule your appointment, please contact [facility] - A DEPT OF [facility]  Dept: [phone]  and follow the prompts.  Office hours are 8:00 a.m. to 4:30 p.m. Monday - Friday. Please note that voicemails left after 4:00 p.m. may not be returned until the following business day.  We are closed weekends and major holidays. You have access to a nurse at all times for urgent questions. Please call the main number to the clinic Dept: [phone] and follow the prompts.   For any non-urgent questions, you may also contact your provider using MyChart. We now offer e-Visits for anyone 19 and older to request care online for non-urgent symptoms. For details visit [email or web address].   Also download the MyChart app! Go to the app store, search "MyChart", open the app, select [facility], and log in with your MyChart username and password.

## 2023-05-16 ENCOUNTER — Encounter: Payer: Self-pay | Admitting: Hematology and Oncology

## 2023-05-16 NOTE — Assessment & Plan Note (Addendum)
 06/02/2020:Right lumpectomy ([facility]): invasive and in situ ductal carcinoma, 1.3cm, clear margins, 1 right axillary lymph node negative for carcinoma. ER 15% weak, PR-,HER-2 positive (3+) Ki67 80%.   Treatment plan: 1.  Adjuvant chemotherapy with Taxol Herceptin followed by Herceptin maintenance.  Stopped after 11 cycles of Taxol Herceptin maintenance completed 07/07/2021  2.  Adjuvant radiation therapy 11/23/2020-12/19/2020 3.  Adjuvant antiestrogen therapy with tamoxifen started October 2022 4.  CT angiogram 07/13/2021: Right supraclavicular lymph node 5 cm, right internal mammary lymph nodes 1.8 cm 5.  Metastatic breast cancer: June 2023:Right supraclavicular lymphadenopathy: Biopsy: Metastatic poorly differentiated adenocarcinoma, ER +40%, PR negative, HER2 positive 3+ PET CT scan 08/06/21: Ext Right Supra clav LN along with Internal mammary and Rt Upper Mediastinal and Rt Axillary LN    CT chest abdomen pelvis 06/25/2022: Stable exam.  No new or progressive findings. CT CAP 10/04/2022: Stable findings without new or progressive disease CT CAP 02/22/2023: Stable findings without new or progressive disease.  Pneumonia that was noted on chest x-ray from earlier in the month was again noted. -----------------------------------------------------------------------------------------------------  Current treatment: Enhertu Metastatic breast cancer.  On treatment with Enhertu.  Next scans scheduled 06/03/2023 - Reviewed pulmonary issues with Dr. Pamelia Hoit, patient to proceed with Enhertu as ordered and scheduled.  -Most recent echo on 04/11/2023 normal, to f/u with Dr. Gala Romney for repeat echo and continued monitoring.     Wheezing.  Patient to monitor symptoms and continue inhalers.  I recommended she call if her symptoms worsen.  Work-related stress exacerbating symptoms. Diagnosed with stress by primary doctor. Working from home is more manageable but faces employer challenges.  RTC 06/12/2023 for labs, f/u  with Dr. Pamelia Hoit, and treatment.

## 2023-05-19 NOTE — Assessment & Plan Note (Signed)
 BMI 50.  She is encouraged to avoid processed foods and to increase activity as tolerated.

## 2023-05-19 NOTE — Assessment & Plan Note (Signed)
 Noted during hospitalization. She will have labs drawn next week at Oncology. I will not repeat labs today.

## 2023-05-24 ENCOUNTER — Ambulatory Visit: Payer: 59 | Admitting: Adult Health

## 2023-05-24 ENCOUNTER — Ambulatory Visit: Payer: 59

## 2023-05-24 ENCOUNTER — Other Ambulatory Visit: Payer: 59

## 2023-05-30 ENCOUNTER — Ambulatory Visit: Payer: 59

## 2023-05-30 ENCOUNTER — Ambulatory Visit: Payer: 59 | Admitting: Adult Health

## 2023-05-30 ENCOUNTER — Other Ambulatory Visit: Payer: Self-pay

## 2023-05-30 ENCOUNTER — Other Ambulatory Visit: Payer: 59

## 2023-06-03 ENCOUNTER — Ambulatory Visit (HOSPITAL_COMMUNITY)

## 2023-06-05 ENCOUNTER — Telehealth: Payer: Self-pay

## 2023-06-05 NOTE — Telephone Encounter (Signed)
 Notified Patient of completion of Disability Forms. Fax transmission confirmation received. Request for records forwarded to  [facility] [employer] [facility]. Copy of forms mailed to Patient as requested. No other needs or concerns noted at this time.

## 2023-06-07 ENCOUNTER — Ambulatory Visit (HOSPITAL_COMMUNITY)
Admission: RE | Admit: 2023-06-07 | Discharge: 2023-06-07 | Disposition: A | Discharge status: Home / Self Care | Source: Ambulatory Visit | Attending: Hematology and Oncology | Admitting: Hematology and Oncology

## 2023-06-07 ENCOUNTER — Ambulatory Visit (HOSPITAL_COMMUNITY)

## 2023-06-07 DIAGNOSIS — Z170 Estrogen receptor positive status [ER+]: Secondary | ICD-10-CM | POA: Insufficient documentation

## 2023-06-07 DIAGNOSIS — C50511 Malignant neoplasm of lower-outer quadrant of right female breast: Secondary | ICD-10-CM | POA: Insufficient documentation

## 2023-06-07 MED ORDER — IOHEXOL 300 MG/ML  SOLN
100.0000 mL | Freq: Once | INTRAMUSCULAR | Status: AC | PRN
  Administered 2023-06-07: 100 mL via INTRAVENOUS

## 2023-06-07 MED ORDER — HEPARIN SOD (PORK) LOCK FLUSH 100 UNIT/ML IV SOLN
500.0000 [IU] | Freq: Once | INTRAVENOUS | Status: AC
  Administered 2023-06-07: 100 [IU] via INTRAVENOUS

## 2023-06-10 ENCOUNTER — Telehealth: Payer: Self-pay

## 2023-06-10 NOTE — Telephone Encounter (Signed)
 Placed call to [facility] reading room for CT CAP to be read for pt's appt 06/12/23.

## 2023-06-11 ENCOUNTER — Ambulatory Visit: Payer: 59

## 2023-06-11 ENCOUNTER — Other Ambulatory Visit: Payer: 59

## 2023-06-11 ENCOUNTER — Ambulatory Visit: Payer: 59 | Admitting: Hematology and Oncology

## 2023-06-11 MED FILL — Fosaprepitant Dimeglumine For IV Infusion 150 MG (Base Eq): INTRAVENOUS | Qty: 5 | Status: AC

## 2023-06-12 ENCOUNTER — Inpatient Hospital Stay: Payer: 59 | Attending: Hematology and Oncology

## 2023-06-12 ENCOUNTER — Inpatient Hospital Stay (HOSPITAL_BASED_OUTPATIENT_CLINIC_OR_DEPARTMENT_OTHER): Payer: 59 | Admitting: Hematology and Oncology

## 2023-06-12 ENCOUNTER — Encounter: Payer: Self-pay | Admitting: *Deleted

## 2023-06-12 ENCOUNTER — Inpatient Hospital Stay: Payer: 59

## 2023-06-12 VITALS — BP 168/80 | HR 86 | Temp 97.8°F | Resp 18 | Ht 66.0 in | Wt 316.3 lb

## 2023-06-12 DIAGNOSIS — Z170 Estrogen receptor positive status [ER+]: Secondary | ICD-10-CM | POA: Diagnosis not present

## 2023-06-12 DIAGNOSIS — I517 Cardiomegaly: Secondary | ICD-10-CM | POA: Diagnosis not present

## 2023-06-12 DIAGNOSIS — D649 Anemia, unspecified: Secondary | ICD-10-CM | POA: Insufficient documentation

## 2023-06-12 DIAGNOSIS — I700 Atherosclerosis of aorta: Secondary | ICD-10-CM | POA: Insufficient documentation

## 2023-06-12 DIAGNOSIS — D696 Thrombocytopenia, unspecified: Secondary | ICD-10-CM | POA: Insufficient documentation

## 2023-06-12 DIAGNOSIS — Z885 Allergy status to narcotic agent status: Secondary | ICD-10-CM | POA: Insufficient documentation

## 2023-06-12 DIAGNOSIS — Z1722 Progesterone receptor negative status: Secondary | ICD-10-CM | POA: Insufficient documentation

## 2023-06-12 DIAGNOSIS — C50511 Malignant neoplasm of lower-outer quadrant of right female breast: Secondary | ICD-10-CM

## 2023-06-12 DIAGNOSIS — Z79899 Other long term (current) drug therapy: Secondary | ICD-10-CM | POA: Insufficient documentation

## 2023-06-12 DIAGNOSIS — Z883 Allergy status to other anti-infective agents status: Secondary | ICD-10-CM | POA: Diagnosis not present

## 2023-06-12 DIAGNOSIS — Z888 Allergy status to other drugs, medicaments and biological substances status: Secondary | ICD-10-CM | POA: Diagnosis not present

## 2023-06-12 DIAGNOSIS — Z95828 Presence of other vascular implants and grafts: Secondary | ICD-10-CM

## 2023-06-12 DIAGNOSIS — C50919 Malignant neoplasm of unspecified site of unspecified female breast: Secondary | ICD-10-CM

## 2023-06-12 DIAGNOSIS — Z5112 Encounter for antineoplastic immunotherapy: Secondary | ICD-10-CM | POA: Diagnosis present

## 2023-06-12 DIAGNOSIS — R110 Nausea: Secondary | ICD-10-CM

## 2023-06-12 LAB — CBC WITH DIFFERENTIAL (CANCER CENTER ONLY)
Abs Immature Granulocytes: 0.01 10*3/uL (ref 0.00–0.07)
Basophils Absolute: 0.1 10*3/uL (ref 0.0–0.1)
Basophils Relative: 2 %
Eosinophils Absolute: 0.3 10*3/uL (ref 0.0–0.5)
Eosinophils Relative: 9 %
HCT: 35.8 % — ABNORMAL LOW (ref 36.0–46.0)
Hemoglobin: 11.9 g/dL — ABNORMAL LOW (ref 12.0–15.0)
Immature Granulocytes: 0 %
Lymphocytes Relative: 26 %
Lymphs Abs: 1 10*3/uL (ref 0.7–4.0)
MCH: 28.2 pg (ref 26.0–34.0)
MCHC: 33.2 g/dL (ref 30.0–36.0)
MCV: 84.8 fL (ref 80.0–100.0)
Monocytes Absolute: 0.6 10*3/uL (ref 0.1–1.0)
Monocytes Relative: 15 %
Neutro Abs: 1.9 10*3/uL (ref 1.7–7.7)
Neutrophils Relative %: 48 %
Platelet Count: 186 10*3/uL (ref 150–400)
RBC: 4.22 MIL/uL (ref 3.87–5.11)
RDW: 16.9 % — ABNORMAL HIGH (ref 11.5–15.5)
WBC Count: 3.9 10*3/uL — ABNORMAL LOW (ref 4.0–10.5)
nRBC: 0 % (ref 0.0–0.2)

## 2023-06-12 LAB — CMP (CANCER CENTER ONLY)
ALT: 29 U/L (ref 0–44)
AST: 42 U/L — ABNORMAL HIGH (ref 15–41)
Albumin: 3.5 g/dL (ref 3.5–5.0)
Alkaline Phosphatase: 107 U/L (ref 38–126)
Anion gap: 5 (ref 5–15)
BUN: 9 mg/dL (ref 6–20)
CO2: 30 mmol/L (ref 22–32)
Calcium: 9.1 mg/dL (ref 8.9–10.3)
Chloride: 107 mmol/L (ref 98–111)
Creatinine: 0.67 mg/dL (ref 0.44–1.00)
GFR, Estimated: >60 mL/min (ref >60)
Glucose, Bld: 106 mg/dL — ABNORMAL HIGH (ref 70–99)
Potassium: 3.6 mmol/L (ref 3.5–5.1)
Sodium: 142 mmol/L (ref 135–145)
Total Bilirubin: 1.5 mg/dL — ABNORMAL HIGH (ref 0.0–1.2)
Total Protein: 6.6 g/dL (ref 6.5–8.1)

## 2023-06-12 MED ORDER — SODIUM CHLORIDE 0.9% FLUSH
10.0000 mL | Freq: Once | INTRAVENOUS | Status: DC
  Administered 2023-06-12: 10 mL

## 2023-06-12 MED ORDER — SODIUM CHLORIDE 0.9 % IV SOLN
150.0000 mg | Freq: Once | INTRAVENOUS | Status: AC
  Administered 2023-06-12: 150 mg via INTRAVENOUS
  Filled 2023-06-12: qty 150

## 2023-06-12 MED ORDER — ACETAMINOPHEN 325 MG PO TABS
650.0000 mg | ORAL_TABLET | Freq: Once | ORAL | Status: AC
  Administered 2023-06-12: 650 mg via ORAL
  Filled 2023-06-12: qty 2

## 2023-06-12 MED ORDER — SODIUM CHLORIDE 0.9 % IV SOLN
Freq: Once | INTRAVENOUS | Status: AC
Start: 2023-06-12 — End: 2023-06-12

## 2023-06-12 MED ORDER — DEXTROSE 5 % IV SOLN
Freq: Once | INTRAVENOUS | Status: AC
Start: 2023-06-12 — End: 2023-06-12

## 2023-06-12 MED ORDER — FAM-TRASTUZUMAB DERUXTECAN-NXKI CHEMO 100 MG IV SOLR
300.0000 mg | Freq: Once | INTRAVENOUS | Status: AC
  Administered 2023-06-12: 300 mg via INTRAVENOUS
  Filled 2023-06-12: qty 15

## 2023-06-12 MED ORDER — CYANOCOBALAMIN 1000 MCG/ML IJ SOLN
1000.0000 ug | Freq: Once | INTRAMUSCULAR | Status: AC
  Administered 2023-06-12: 1000 ug via INTRAMUSCULAR
  Filled 2023-06-12: qty 1

## 2023-06-12 MED ORDER — PALONOSETRON HCL INJECTION 0.25 MG/5ML
0.2500 mg | Freq: Once | INTRAVENOUS | Status: AC
Start: 2023-06-12 — End: 2023-06-12
  Administered 2023-06-12: 0.25 mg via INTRAVENOUS
  Filled 2023-06-12: qty 5

## 2023-06-12 NOTE — Patient Instructions (Signed)
 [facility] - A DEPT OF [facility]  Discharge Instructions: Thank you for choosing [facility] to provide your oncology and hematology care.   If you have a lab appointment with the Cancer Center, please go directly to the Cancer Center and check in at the registration area.   Wear comfortable clothing and clothing appropriate for easy access to any Portacath or PICC line.   We strive to give you quality time with your provider. You may need to reschedule your appointment if you arrive late (15 or more minutes).  Arriving late affects you and other patients whose appointments are after yours.  Also, if you miss three or more appointments without notifying the office, you may be dismissed from the clinic at the provider's discretion.      For prescription refill requests, have your pharmacy contact our office and allow 72 hours for refills to be completed.    Today you received the following chemotherapy and/or immunotherapy agents enhertu      To help prevent nausea and vomiting after your treatment, we encourage you to take your nausea medication as directed.  BELOW ARE SYMPTOMS THAT SHOULD BE REPORTED IMMEDIATELY: *FEVER GREATER THAN 100.4 F (38 C) OR HIGHER *CHILLS OR SWEATING *NAUSEA AND VOMITING THAT IS NOT CONTROLLED WITH YOUR NAUSEA MEDICATION *UNUSUAL SHORTNESS OF BREATH *UNUSUAL BRUISING OR BLEEDING *URINARY PROBLEMS (pain or burning when urinating, or frequent urination) *BOWEL PROBLEMS (unusual diarrhea, constipation, pain near the anus) TENDERNESS IN MOUTH AND THROAT WITH OR WITHOUT PRESENCE OF ULCERS (sore throat, sores in mouth, or a toothache) UNUSUAL RASH, SWELLING OR PAIN  UNUSUAL VAGINAL DISCHARGE OR ITCHING   Items with * indicate a potential emergency and should be followed up as soon as possible or go to the Emergency Department if any problems should occur.  Please show the CHEMOTHERAPY ALERT CARD or IMMUNOTHERAPY  ALERT CARD at check-in to the Emergency Department and triage nurse.  Should you have questions after your visit or need to cancel or reschedule your appointment, please contact [facility] - A DEPT OF [facility]  Dept: [phone]  and follow the prompts.  Office hours are 8:00 a.m. to 4:30 p.m. Monday - Friday. Please note that voicemails left after 4:00 p.m. may not be returned until the following business day.  We are closed weekends and major holidays. You have access to a nurse at all times for urgent questions. Please call the main number to the clinic Dept: [phone] and follow the prompts.   For any non-urgent questions, you may also contact your provider using MyChart. We now offer e-Visits for anyone 60 and older to request care online for non-urgent symptoms. For details visit [email or web address].   Also download the MyChart app! Go to the app store, search "MyChart", open the app, select [place], and log in with your MyChart username and password.

## 2023-06-12 NOTE — Progress Notes (Signed)
 Patient Care Team: Dorothyann Peng, MD as PCP - General (Internal Medicine) Kathryne Hitch, MD as Consulting Physician (Orthopedic Surgery) Pershing Proud, RN as Oncology Nurse Navigator Donnelly Angelica, RN as Oncology Nurse Navigator Harriette Bouillon, MD as Consulting Physician (General Surgery) Serena Croissant, MD as Consulting Physician (Hematology and Oncology) Lonie Peak, MD as Attending Physician (Radiation Oncology)  DIAGNOSIS:  Encounter Diagnosis  Name Primary?   Malignant neoplasm of lower-outer quadrant of right breast of female, estrogen receptor positive (HCC) Yes    SUMMARY OF ONCOLOGIC HISTORY: Oncology History  Malignant neoplasm of lower-outer quadrant of right breast of female, estrogen receptor positive (HCC)  05/03/2020 Initial Diagnosis   Screening mammogram showed a 1.0cm mass at the 6 o'clock position in the right breast. Diagnostic mammogram and US showed the 1.2cm mass at the 6 o'clock position in the right breast. Biopsy showed IDC, grade 3, HER-2 positive (3+), ER 15% weak, PR-, Ki67 80%.   05/19/2020 Genetic Testing   Negative hereditary cancer genetic testing: no pathogenic variants detected in [facility] Breast STAT Panel and Common Hereditary Cancers Panel.  The report dates are May 19, 2020 (STAT) and May 23, 2020 (Common Hereditary).   The Common Hereditary Cancers Panel offered by [facility] includes sequencing and/or deletion duplication testing of the following 47 genes: APC, ATM, AXIN2, BARD1, BMPR1A, BRCA1, BRCA2, BRIP1, CDH1, CDK4, CDKN2A (p14ARF), CDKN2A (p16INK4a), CHEK2, CTNNA1, DICER1, EPCAM (Deletion/duplication testing only), GREM1 (promoter region deletion/duplication testing only), GREM1, HOXB13, KIT, MEN1, MLH1, MSH2, MSH3, MSH6, MUTYH, NBN, NF1, NHTL1, PALB2, PDGFRA, PMS2, POLD1, POLE, PTEN, RAD50, RAD51C, RAD51D, SDHA, SDHB, SDHC, SDHD, SMAD4, SMARCA4. STK11, TP53, TSC1, TSC2, and VHL.  The following genes were evaluated for sequence  changes only: SDHA and HOXB13 c.251G>A variant only.es only: SDHA and HOXB13 c.251G>A variant only.   06/02/2020 Surgery   Right lumpectomy ([facility]): invasive and in situ ductal carcinoma, 1.3cm, clear margins, 1 right axillary lymph node negative for carcinoma.   06/02/2020 Cancer Staging   Staging form: Breast, AJCC 8th Edition - Pathologic stage from 06/02/2020: Stage IA (pT1c, pN0, cM0, G3, ER+, PR-, HER2+) - Signed by Loa Socks, NP on 06/15/2020 Stage prefix: Initial diagnosis Histologic grading system: 3 grade system   07/08/2020 - 10/21/2020 Chemotherapy   Taxol Herceptin followed by Herceptin maintenance   04/14/2021 -  Anti-estrogen oral therapy   Tamoxifen 10 mg   07/13/2021 Imaging   CT angiogram: Right supraclavicular lymph node 5 cm, right internal mammary lymph nodes 1.8 cm    08/06/2021 PET scan   PET CT scan: Ext Right Supra clav LN along with Internal mammary and Rt Upper Mediastinal and Rt Axillary LN   08/2021 Initial Biopsy   Right supraclavicular lymphadenopathy: Biopsy: Metastatic poorly differentiated adenocarcinoma, ER +40%, PR negative, HER2 positive 3+    08/21/2021 -  Chemotherapy   Enhertu every 3 weeks    03/08/2022 Imaging   CT chest/abdomen/pelvis  1. No new or progressive findings in the chest, abdomen or pelvis. 2. Signs of RIGHT breast lumpectomy with areas of central fat necrosis unchanged from previous imaging. 3. Stable small lymph nodes throughout the chest, largest with area of soft tissue about surgical clips in the RIGHT axilla. 4. Stable appearance of thoracic inlet lymph nodes, not shown to be hypermetabolic on prior PET imaging. 5. IUD in-situ. 6. Aortic atherosclerosis.   08/21/2022 Imaging   Echocardiogram  -normal LVEF at 60-65%. -no left ventricular wall motion abnormalities  -normal left ventricular diastolic function  -  mild left ventricular hypertrophy.    10/05/2022 Imaging   CT Chest, Abdomen, and Pelvis with  contrast IMPRESSION: 1. Stable examination without new or progressive finding in the chest, abdomen or pelvis to suggest recurrent or metastatic disease. 2. Stable tiny bilateral thoracic inlet lymph nodes. 3.  Aortic Atherosclerosis (ICD10-I70.0).     CHIEF COMPLIANT: Follow-up on Enhertu to discuss results of scans  HISTORY OF PRESENT ILLNESS:   History of Present Illness The patient, with a history of lung issues and fatigue, reports a slight improvement in her fatigue levels. She has been using an inhaler once and has been working in a general population setting. Despite wearing a mask, the patient has previously contracted pneumonia and the flu. The patient has been coughing up some phlegm, but not excessively. She has been on a treatment regimen every four weeks, which she believes has contributed to her improvement. The patient has also experienced some general pain, which she attributes to lack of movement and exercise. She has attempted to increase her physical activity by walking and going to the gym. However, she has been hindered by pollen allergies.     ALLERGIES:  is allergic to pollen extract, compazine [prochlorperazine], latex, codeine, diflucan [fluconazole], shellfish allergy, and betadine [povidone iodine].  MEDICATIONS:  Current Outpatient Medications  Medication Sig Dispense Refill   albuterol (VENTOLIN HFA) 108 (90 Base) MCG/ACT inhaler Inhale 2 puffs into the lungs every 6 (six) hours as needed for wheezing or shortness of breath. 8 g 2   aspirin EC 81 MG tablet Take 1 tablet (81 mg total) by mouth daily. Swallow whole. 30 tablet 11   benzonatate (TESSALON) 100 MG capsule Take 1 capsule (100 mg total) by mouth 3 (three) times daily as needed for cough. 30 capsule 0   chlorthalidone (HYGROTON) 25 MG tablet Take 1 tablet (25 mg total) by mouth daily. 90 tablet 1   levonorgestrel (MIRENA, 52 MG,) 20 MCG/DAY IUD 1 each by Intrauterine route once.      lidocaine-prilocaine (EMLA) cream Apply to affected area once AS DIRECTED 30 g 3   potassium chloride (MICRO-K) 10 MEQ CR capsule Take 40 mEq by mouth daily.     rosuvastatin (CRESTOR) 40 MG tablet Take 1 tablet (40 mg total) by mouth daily. 90 tablet 3   Spacer/Aero-Holding Chambers (AEROCHAMBER PLUS) Device Please dispense spacer for inhaler use 1 each 0   spironolactone (ALDACTONE) 25 MG tablet Take 1 tablet (25 mg total) by mouth daily. 30 tablet 6   valsartan (DIOVAN) 160 MG tablet Take 1 tablet (160 mg total) by mouth daily. 30 tablet 11   venlafaxine XR (EFFEXOR-XR) 37.5 MG 24 hr capsule Take 37.5 mg by mouth every morning.     furosemide (LASIX) 20 MG tablet Take 1 tablet (20 mg total) by mouth as needed. TAKE 1 TABLET AS NEEDED FOR SWELLING OR SHORTNESS OF BREATH OR WEIGHT GAIN. 3 POUNDS WITHIN 24HR OR 5 POUNDS 7 DAYS (Patient not taking: Reported on 06/12/2023) 90 tablet 3   No current facility-administered medications for this visit.   Facility-Administered Medications Ordered in Other Visits  Medication Dose Route Frequency Provider Last Rate Last Admin   0.9 %  sodium chloride infusion   Intravenous Once Serena Croissant, MD       cyanocobalamin (VITAMIN B12) injection 1,000 mcg  1,000 mcg Intramuscular Once Serena Croissant, MD       heparin lock flush 100 unit/mL  500 Units Intracatheter Once Serena Croissant, MD  sodium chloride flush (NS) 0.9 % injection 10 mL  10 mL Intracatheter Once Serena Croissant, MD        PHYSICAL EXAMINATION: ECOG PERFORMANCE STATUS: 1 - Symptomatic but completely ambulatory  Vitals:   06/12/23 0900  BP: (!) 168/80  Pulse: 86  Resp: 18  Temp: 97.8 F (36.6 C)  SpO2: 100%   Filed Weights   06/12/23 0900  Weight: (!) 316 lb 4.8 oz (143.5 kg)      LABORATORY DATA:  I have reviewed the data as listed    Latest Ref Rng & Units 05/15/2023    8:58 AM 05/04/2023    5:00 AM 05/03/2023    5:22 AM  CMP  Glucose 70 - 99 mg/dL 213  72  71   BUN 6 - 20  mg/dL 10  13  13    Creatinine 0.44 - 1.00 mg/dL 0.86  5.78  4.69   Sodium 135 - 145 mmol/L 140  139  140   Potassium 3.5 - 5.1 mmol/L 3.7  3.4  3.4   Chloride 98 - 111 mmol/L 107  104  105   CO2 22 - 32 mmol/L 28  31  29    Calcium 8.9 - 10.3 mg/dL 8.8  8.6  8.3   Total Protein 6.5 - 8.1 g/dL 6.3     Total Bilirubin 0.0 - 1.2 mg/dL 1.2     Alkaline Phos 38 - 126 U/L 100     AST 15 - 41 U/L 55     ALT 0 - 44 U/L 46       Lab Results  Component Value Date   WBC 3.9 (L) 06/12/2023   HGB 11.9 (L) 06/12/2023   HCT 35.8 (L) 06/12/2023   MCV 84.8 06/12/2023   PLT 186 06/12/2023   NEUTROABS 1.9 06/12/2023    ASSESSMENT & PLAN:  Malignant neoplasm of lower-outer quadrant of right breast of female, estrogen receptor positive (HCC) 06/02/2020:Right lumpectomy ([facility]): invasive and in situ ductal carcinoma, 1.3cm, clear margins, 1 right axillary lymph node negative for carcinoma. ER 15% weak, PR-,HER-2 positive (3+) Ki67 80%.   Treatment plan: 1.  Adjuvant chemotherapy with Taxol Herceptin followed by Herceptin maintenance.  Stopped after 11 cycles of Taxol Herceptin maintenance completed 07/07/2021  2.  Adjuvant radiation therapy 11/23/2020-12/19/2020 3.  Adjuvant antiestrogen therapy with tamoxifen started October 2022 4.  CT angiogram 07/13/2021: Right supraclavicular lymph node 5 cm, right internal mammary lymph nodes 1.8 cm 5.  Metastatic breast cancer: June 2023:Right supraclavicular lymphadenopathy: Biopsy: Metastatic poorly differentiated adenocarcinoma, ER +40%, PR negative, HER2 positive 3+ PET CT scan 08/06/21: Ext Right Supra clav LN along with Internal mammary and Rt Upper Mediastinal and Rt Axillary LN    CT chest abdomen pelvis 06/25/2022: Stable exam.  No new or progressive findings. CT CAP 10/04/2022: Stable findings without new or progressive disease CT CAP 02/22/2023: Stable findings without new or progressive disease.  Pneumonia that was noted on chest x-ray from earlier in the  month was again noted. -----------------------------------------------------------------------------------------------------  Current treatment: Enhertu started on 08/21/2021 now every 4 weeks, today cycle 25 Enhertu toxicities:   Hospitalization 04/29/2023-05/04/2023: Community-acquired pneumonia Chemo-induced peripheral neuropathy  CT CAP 06/10/2023: Small supraclavicular lymph node on the left 7 mm, right axillary nodular thickening 1.5 cm overall stable disease, and remained in lung infiltrates.  Echocardiogram every 4 months based upon Dr. Prescott Gum recommendation.   Return to clinic in 4 weeks for labs and Enhertu We discussed that eventually we might consider doing  less often Enhertu versus switching her to CDK 4 and 6 inhibitor with antiestrogen therapy especially if she is having difficulties tolerating Enhertu.  So far she is tolerating it fairly well.  She thinks her energy levels are improving. Assessment & Plan Anemia Hemoglobin improved from 9.8 to 11.9, reducing fatigue.  Thrombocytopenia Platelet count improved from 128 to 186, indicating positive treatment response. WBC near normal, suggesting hematologic improvement.  Lung infection Infection improving with minimal coughing and mucus. - Advise continued mask use in public settings to prevent further infections.  Fatigue Decreased fatigue, likely related to treatment and recovery.  Generalized pain Pain due to inactivity, managed with Tylenol. - Encourage regular physical activity to alleviate pain. - Consider gym exercises as tolerated.  Follow-up Treatment schedule every four weeks. Potential extension of intervals considered, reassessment next month. - Schedule follow-up appointment for next month. - Reassess treatment interval and medication options at next visit. - Provide updated note for avoiding contact until the end of the year.   No orders of the defined types were placed in this encounter.  The patient  has a good understanding of the overall plan. she agrees with it. she will call with any problems that may develop before the next visit here. Total time spent: 30 mins including face to face time and time spent for planning, charting and co-ordination of care   Tamsen Meek, MD 06/12/23

## 2023-06-12 NOTE — Assessment & Plan Note (Signed)
 06/02/2020:Right lumpectomy ([facility]): invasive and in situ ductal carcinoma, 1.3cm, clear margins, 1 right axillary lymph node negative for carcinoma. ER 15% weak, PR-,HER-2 positive (3+) Ki67 80%.   Treatment plan: 1.  Adjuvant chemotherapy with Taxol Herceptin followed by Herceptin maintenance.  Stopped after 11 cycles of Taxol Herceptin maintenance completed 07/07/2021  2.  Adjuvant radiation therapy 11/23/2020-12/19/2020 3.  Adjuvant antiestrogen therapy with tamoxifen started October 2022 4.  CT angiogram 07/13/2021: Right supraclavicular lymph node 5 cm, right internal mammary lymph nodes 1.8 cm 5.  Metastatic breast cancer: June 2023:Right supraclavicular lymphadenopathy: Biopsy: Metastatic poorly differentiated adenocarcinoma, ER +40%, PR negative, HER2 positive 3+ PET CT scan 08/06/21: Ext Right Supra clav LN along with Internal mammary and Rt Upper Mediastinal and Rt Axillary LN    CT chest abdomen pelvis 06/25/2022: Stable exam.  No new or progressive findings. CT CAP 10/04/2022: Stable findings without new or progressive disease CT CAP 02/22/2023: Stable findings without new or progressive disease.  Pneumonia that was noted on chest x-ray from earlier in the month was again noted. -----------------------------------------------------------------------------------------------------  Current treatment: Enhertu started on 08/21/2021 now every 4 weeks, today cycle 24 Enhertu toxicities:   Hospitalization 04/29/2023-05/04/2023: Community-acquired pneumonia Chemo-induced peripheral neuropathy  CT CAP 06/10/2023: Small supraclavicular lymph node on the left 7 mm, right axillary nodular thickening 1.5 cm overall stable disease  Echocardiogram every 4 months based upon Dr. Prescott Gum recommendation   Return to clinic in 4 weeks for labs and Enhertu

## 2023-06-13 ENCOUNTER — Other Ambulatory Visit: Payer: Self-pay | Admitting: Hematology and Oncology

## 2023-06-13 DIAGNOSIS — Z170 Estrogen receptor positive status [ER+]: Secondary | ICD-10-CM

## 2023-06-18 ENCOUNTER — Other Ambulatory Visit: Payer: Self-pay

## 2023-07-04 ENCOUNTER — Other Ambulatory Visit: Payer: Self-pay | Admitting: Obstetrics and Gynecology

## 2023-07-04 DIAGNOSIS — Z9889 Other specified postprocedural states: Secondary | ICD-10-CM

## 2023-07-04 DIAGNOSIS — Z853 Personal history of malignant neoplasm of breast: Secondary | ICD-10-CM

## 2023-07-09 MED FILL — Fosaprepitant Dimeglumine For IV Infusion 150 MG (Base Eq): INTRAVENOUS | Qty: 5 | Status: AC

## 2023-07-10 ENCOUNTER — Inpatient Hospital Stay: Payer: 59

## 2023-07-10 ENCOUNTER — Inpatient Hospital Stay: Payer: 59 | Admitting: Adult Health

## 2023-07-10 ENCOUNTER — Telehealth: Payer: Self-pay | Admitting: Hematology and Oncology

## 2023-07-11 MED FILL — Fosaprepitant Dimeglumine For IV Infusion 150 MG (Base Eq): INTRAVENOUS | Qty: 5 | Status: AC

## 2023-07-12 ENCOUNTER — Inpatient Hospital Stay: Attending: Hematology and Oncology

## 2023-07-12 ENCOUNTER — Inpatient Hospital Stay

## 2023-07-12 VITALS — BP 141/65 | HR 72 | Temp 98.0°F | Resp 18 | Wt 322.5 lb

## 2023-07-12 DIAGNOSIS — C50919 Malignant neoplasm of unspecified site of unspecified female breast: Secondary | ICD-10-CM

## 2023-07-12 DIAGNOSIS — R110 Nausea: Secondary | ICD-10-CM

## 2023-07-12 DIAGNOSIS — Z5112 Encounter for antineoplastic immunotherapy: Secondary | ICD-10-CM | POA: Diagnosis present

## 2023-07-12 DIAGNOSIS — Z79899 Other long term (current) drug therapy: Secondary | ICD-10-CM | POA: Insufficient documentation

## 2023-07-12 DIAGNOSIS — C50511 Malignant neoplasm of lower-outer quadrant of right female breast: Secondary | ICD-10-CM | POA: Diagnosis present

## 2023-07-12 DIAGNOSIS — Z95828 Presence of other vascular implants and grafts: Secondary | ICD-10-CM

## 2023-07-12 LAB — CBC WITH DIFFERENTIAL (CANCER CENTER ONLY)
Abs Immature Granulocytes: 0.02 10*3/uL (ref 0.00–0.07)
Basophils Absolute: 0 10*3/uL (ref 0.0–0.1)
Basophils Relative: 1 %
Eosinophils Absolute: 0.4 10*3/uL (ref 0.0–0.5)
Eosinophils Relative: 10 %
HCT: 34.8 % — ABNORMAL LOW (ref 36.0–46.0)
Hemoglobin: 11.3 g/dL — ABNORMAL LOW (ref 12.0–15.0)
Immature Granulocytes: 1 %
Lymphocytes Relative: 23 %
Lymphs Abs: 0.9 10*3/uL (ref 0.7–4.0)
MCH: 27.6 pg (ref 26.0–34.0)
MCHC: 32.5 g/dL (ref 30.0–36.0)
MCV: 84.9 fL (ref 80.0–100.0)
Monocytes Absolute: 0.6 10*3/uL (ref 0.1–1.0)
Monocytes Relative: 15 %
Neutro Abs: 2 10*3/uL (ref 1.7–7.7)
Neutrophils Relative %: 50 %
Platelet Count: 150 10*3/uL (ref 150–400)
RBC: 4.1 MIL/uL (ref 3.87–5.11)
RDW: 16 % — ABNORMAL HIGH (ref 11.5–15.5)
WBC Count: 3.9 10*3/uL — ABNORMAL LOW (ref 4.0–10.5)
nRBC: 0 % (ref 0.0–0.2)

## 2023-07-12 LAB — CMP (CANCER CENTER ONLY)
ALT: 30 U/L (ref 0–44)
AST: 46 U/L — ABNORMAL HIGH (ref 15–41)
Albumin: 3.5 g/dL (ref 3.5–5.0)
Alkaline Phosphatase: 110 U/L (ref 38–126)
Anion gap: 5 (ref 5–15)
BUN: 7 mg/dL (ref 6–20)
CO2: 31 mmol/L (ref 22–32)
Calcium: 9 mg/dL (ref 8.9–10.3)
Chloride: 106 mmol/L (ref 98–111)
Creatinine: 0.68 mg/dL (ref 0.44–1.00)
GFR, Estimated: >60 mL/min (ref >60)
Glucose, Bld: 137 mg/dL — ABNORMAL HIGH (ref 70–99)
Potassium: 3.5 mmol/L (ref 3.5–5.1)
Sodium: 142 mmol/L (ref 135–145)
Total Bilirubin: 1.5 mg/dL — ABNORMAL HIGH (ref 0.0–1.2)
Total Protein: 6.4 g/dL — ABNORMAL LOW (ref 6.5–8.1)

## 2023-07-12 MED ORDER — PALONOSETRON HCL INJECTION 0.25 MG/5ML
0.2500 mg | Freq: Once | INTRAVENOUS | Status: AC
  Administered 2023-07-12: 0.25 mg via INTRAVENOUS
  Filled 2023-07-12: qty 5

## 2023-07-12 MED ORDER — CYANOCOBALAMIN 1000 MCG/ML IJ SOLN
1000.0000 ug | Freq: Once | INTRAMUSCULAR | Status: AC
  Administered 2023-07-12: 1000 ug via INTRAMUSCULAR
  Filled 2023-07-12: qty 1

## 2023-07-12 MED ORDER — FAM-TRASTUZUMAB DERUXTECAN-NXKI CHEMO 100 MG IV SOLR
300.0000 mg | Freq: Once | INTRAVENOUS | Status: AC
  Administered 2023-07-12: 300 mg via INTRAVENOUS
  Filled 2023-07-12: qty 15

## 2023-07-12 MED ORDER — SODIUM CHLORIDE 0.9% FLUSH
10.0000 mL | INTRAVENOUS | Status: DC | PRN
  Administered 2023-07-12: 10 mL

## 2023-07-12 MED ORDER — ACETAMINOPHEN 325 MG PO TABS
650.0000 mg | ORAL_TABLET | Freq: Once | ORAL | Status: AC
  Administered 2023-07-12: 650 mg via ORAL
  Filled 2023-07-12: qty 2

## 2023-07-12 MED ORDER — SODIUM CHLORIDE 0.9 % IV SOLN
150.0000 mg | Freq: Once | INTRAVENOUS | Status: AC
  Administered 2023-07-12: 150 mg via INTRAVENOUS
  Filled 2023-07-12: qty 5
  Filled 2023-07-12: qty 150

## 2023-07-12 MED ORDER — SODIUM CHLORIDE 0.9% FLUSH
10.0000 mL | Freq: Once | INTRAVENOUS | Status: AC
Start: 2023-07-12 — End: 2023-07-12
  Administered 2023-07-12: 10 mL

## 2023-07-12 MED ORDER — DEXTROSE 5 % IV SOLN: Freq: Once | INTRAVENOUS | Status: AC

## 2023-07-12 MED ORDER — HEPARIN SOD (PORK) LOCK FLUSH 100 UNIT/ML IV SOLN
500.0000 [IU] | Freq: Once | INTRAVENOUS | Status: AC | PRN
  Administered 2023-07-12: 500 [IU]

## 2023-07-12 NOTE — Patient Instructions (Signed)
 [facility] - A DEPT OF [facility]  Discharge Instructions: Thank you for choosing [facility] to provide your oncology and hematology care.   If you have a lab appointment with the Cancer Center, please go directly to the Cancer Center and check in at the registration area.   Wear comfortable clothing and clothing appropriate for easy access to any Portacath or PICC line.   We strive to give you quality time with your provider. You may need to reschedule your appointment if you arrive late (15 or more minutes).  Arriving late affects you and other patients whose appointments are after yours.  Also, if you miss three or more appointments without notifying the office, you may be dismissed from the clinic at the provider's discretion.      For prescription refill requests, have your pharmacy contact our office and allow 72 hours for refills to be completed.    Today you received the following chemotherapy and/or immunotherapy agents Enhertu      To help prevent nausea and vomiting after your treatment, we encourage you to take your nausea medication as directed.  BELOW ARE SYMPTOMS THAT SHOULD BE REPORTED IMMEDIATELY: *FEVER GREATER THAN 100.4 F (38 C) OR HIGHER *CHILLS OR SWEATING *NAUSEA AND VOMITING THAT IS NOT CONTROLLED WITH YOUR NAUSEA MEDICATION *UNUSUAL SHORTNESS OF BREATH *UNUSUAL BRUISING OR BLEEDING *URINARY PROBLEMS (pain or burning when urinating, or frequent urination) *BOWEL PROBLEMS (unusual diarrhea, constipation, pain near the anus) TENDERNESS IN MOUTH AND THROAT WITH OR WITHOUT PRESENCE OF ULCERS (sore throat, sores in mouth, or a toothache) UNUSUAL RASH, SWELLING OR PAIN  UNUSUAL VAGINAL DISCHARGE OR ITCHING   Items with * indicate a potential emergency and should be followed up as soon as possible or go to the Emergency Department if any problems should occur.  Please show the CHEMOTHERAPY ALERT CARD or IMMUNOTHERAPY  ALERT CARD at check-in to the Emergency Department and triage nurse.  Should you have questions after your visit or need to cancel or reschedule your appointment, please contact [facility] - A DEPT OF [facility]  Dept: [phone]  and follow the prompts.  Office hours are 8:00 a.m. to 4:30 p.m. Monday - Friday. Please note that voicemails left after 4:00 p.m. may not be returned until the following business day.  We are closed weekends and major holidays. You have access to a nurse at all times for urgent questions. Please call the main number to the clinic Dept: [phone] and follow the prompts.   For any non-urgent questions, you may also contact your provider using MyChart. We now offer e-Visits for anyone 19 and older to request care online for non-urgent symptoms. For details visit [email or web address].   Also download the MyChart app! Go to the app store, search "MyChart", open the app, select [facility], and log in with your MyChart username and password.

## 2023-07-16 NOTE — Progress Notes (Deleted)
 I,Zailey Audia T Basil Lim, CMA,acting as a Neurosurgeon for Smiley Dung, MD.,have documented all relevant documentation on the behalf of Smiley Dung, MD,as directed by  Smiley Dung, MD while in the presence of Smiley Dung, MD.  Subjective:    Patient ID: Pamela [name] , female    DOB: 09-15-1965 , 58 y.o.   MRN: [id number]  No chief complaint on file.   HPI  HPI   Past Medical History:  Diagnosis Date   Anxiety    Asthma    Back pain    Breast cancer (HCC)    Constipation    DUB (dysfunctional uterine bleeding) 2007   Edema of both lower extremities    Elevated cholesterol    Family history of breast cancer 05/12/2020   Family history of lung cancer 05/12/2020   Food allergy    Grade I diastolic dysfunction 04/30/2023   H/O menorrhagia 05/01/2006   High cholesterol    History of ovarian cyst 2007   Hypertension 03/31/2005   Increased BMI 07/11/2006   Joint pain    Migraine    N&V (nausea and vomiting)    Obesity 2007   Obstructive sleep apnea syndrome, moderate 10/27/2013   no cpap   Personal history of chemotherapy    Personal history of radiation therapy    Sleep apnea    SOB (shortness of breath)    Vitamin D  deficiency    Vitamin D  deficiency    Weight loss 07/11/2006     Family History  Problem Relation Age of Onset   Hypertension Mother    High Cholesterol Mother    Thyroid  disease Mother    Cancer Mother    Sleep apnea Mother    Hypertension Father    Heart attack Father    Sudden death Father    Diabetes Maternal Grandmother    Bone cancer Maternal Grandfather        dx after 50   Breast cancer Maternal Aunt        dx 30s   Lung cancer Maternal Aunt        dx after 50     Current Outpatient Medications:    albuterol  (VENTOLIN  HFA) 108 (90 Base) MCG/ACT inhaler, Inhale 2 puffs into the lungs every 6 (six) hours as needed for wheezing or shortness of breath., Disp: 8 g, Rfl: 2   aspirin  EC 81 MG tablet, Take 1 tablet (81 mg total) by  mouth daily. Swallow whole., Disp: 30 tablet, Rfl: 11   benzonatate  (TESSALON ) 100 MG capsule, Take 1 capsule (100 mg total) by mouth 3 (three) times daily as needed for cough., Disp: 30 capsule, Rfl: 0   chlorthalidone  (HYGROTON ) 25 MG tablet, Take 1 tablet (25 mg total) by mouth daily., Disp: 90 tablet, Rfl: 1   escitalopram (LEXAPRO) 10 MG tablet, Take 10 mg by mouth daily. Take one tablet by mouth once daily., Disp: , Rfl:    furosemide  (LASIX ) 20 MG tablet, Take 1 tablet (20 mg total) by mouth as needed. TAKE 1 TABLET AS NEEDED FOR SWELLING OR SHORTNESS OF BREATH OR WEIGHT GAIN. 3 POUNDS WITHIN 24HR OR 5 POUNDS 7 DAYS (Patient not taking: Reported on 06/12/2023), Disp: 90 tablet, Rfl: 3   levonorgestrel  (MIRENA , 52 MG,) 20 MCG/DAY IUD, 1 each by Intrauterine route once., Disp: , Rfl:    lidocaine -prilocaine  (EMLA ) cream, Apply to affected area once AS DIRECTED, Disp: 30 g, Rfl: 3   LORazepam  (ATIVAN ) 0.5 MG tablet, Take 0.5  mg by mouth daily. Take one tablet by mouth once daily as needed., Disp: , Rfl:    ondansetron  (ZOFRAN ) 8 MG tablet, Take 1 tablet (8 mg total) by mouth 2 (two) times daily as needed for refractory nausea / vomiting. Start on day 3 after chemo., Disp: 30 tablet, Rfl: 1   potassium chloride  (MICRO-K ) 10 MEQ CR capsule, Take 40 mEq by mouth daily., Disp: , Rfl:    rosuvastatin  (CRESTOR ) 40 MG tablet, Take 1 tablet (40 mg total) by mouth daily., Disp: 90 tablet, Rfl: 3   Spacer/Aero-Holding Chambers (AEROCHAMBER PLUS) Device, Please dispense spacer for inhaler use, Disp: 1 each, Rfl: 0   spironolactone  (ALDACTONE ) 25 MG tablet, Take 1 tablet (25 mg total) by mouth daily., Disp: 30 tablet, Rfl: 6   valsartan  (DIOVAN ) 160 MG tablet, Take 1 tablet (160 mg total) by mouth daily., Disp: 30 tablet, Rfl: 11   venlafaxine  XR (EFFEXOR -XR) 37.5 MG 24 hr capsule, Take 37.5 mg by mouth every morning., Disp: , Rfl:  No current facility-administered medications for this  visit.  Facility-Administered Medications Ordered in Other Visits:    heparin  lock flush 100 unit/mL, 500 Units, Intracatheter, Once, Cameron Cea, MD   sodium chloride  flush (NS) 0.9 % injection 10 mL, 10 mL, Intracatheter, Once, Gudena, Vinay, MD   Allergies  Allergen Reactions   Pollen Extract Shortness Of Breath   Compazine  [Prochlorperazine ] Anxiety    IV compazine  causes severe anxiety attack   Latex Rash   Codeine Hives   Diflucan  [Fluconazole ] Hives   Shellfish Allergy Hives   Betadine [Povidone Iodine] Rash      The patient states she uses {contraceptive methods:5051} for birth control. No LMP recorded.. {Dysmenorrhea-menorrhagia:21918}. Negative for: breast discharge, breast lump(s), breast pain and breast self exam. Associated symptoms include abnormal vaginal bleeding. Pertinent negatives include abnormal bleeding (hematology), anxiety, decreased libido, depression, difficulty falling sleep, dyspareunia, history of infertility, nocturia, sexual dysfunction, sleep disturbances, urinary incontinence, urinary urgency, vaginal discharge and vaginal itching. Diet regular.The patient states her exercise level is    . The patient's tobacco use is:  Social History   Tobacco Use  Smoking Status Never  Smokeless Tobacco Never  . She has been exposed to passive smoke. The patient's alcohol use is:  Social History   Substance and Sexual Activity  Alcohol Use No  . Additional information: Last pap ***, next one scheduled for ***.    Review of Systems  Constitutional: Negative.   HENT: Negative.    Eyes: Negative.   Respiratory: Negative.    Cardiovascular: Negative.   Gastrointestinal: Negative.   Endocrine: Negative.   Genitourinary: Negative.   Musculoskeletal: Negative.   Skin: Negative.   Allergic/Immunologic: Negative.   Neurological: Negative.   Hematological: Negative.   Psychiatric/Behavioral: Negative.       There were no vitals filed for this visit. There  is no height or weight on file to calculate BMI.  Wt Readings from Last 3 Encounters:  07/12/23 (!) 322 lb 8 oz (146.3 kg)  06/12/23 (!) 316 lb 4.8 oz (143.5 kg)  05/15/23 (!) 314 lb 1.6 oz (142.5 kg)     Objective:  Physical Exam      Assessment And Plan:     Encounter for annual health examination  Hypertensive heart disease without heart failure  Pure hypercholesterolemia  Aortic atherosclerosis (HCC)  Hypokalemia     No follow-ups on file. Patient was given opportunity to ask questions. Patient verbalized understanding of the plan and was  able to repeat key elements of the plan. All questions were answered to their satisfaction.   Smiley Dung, MD  I, Smiley Dung, MD, have reviewed all documentation for this visit. The documentation on 07/16/23 for the exam, diagnosis, procedures, and orders are all accurate and complete.

## 2023-07-16 NOTE — Patient Instructions (Incomplete)

## 2023-07-17 ENCOUNTER — Encounter: Payer: 59 | Admitting: Internal Medicine

## 2023-07-17 DIAGNOSIS — Z0000 Encounter for general adult medical examination without abnormal findings: Secondary | ICD-10-CM

## 2023-07-17 DIAGNOSIS — I119 Hypertensive heart disease without heart failure: Secondary | ICD-10-CM

## 2023-07-17 DIAGNOSIS — E7800 Pure hypercholesterolemia, unspecified: Secondary | ICD-10-CM

## 2023-07-17 DIAGNOSIS — E876 Hypokalemia: Secondary | ICD-10-CM

## 2023-07-17 DIAGNOSIS — I700 Atherosclerosis of aorta: Secondary | ICD-10-CM

## 2023-07-18 ENCOUNTER — Other Ambulatory Visit: Payer: Self-pay

## 2023-07-22 ENCOUNTER — Encounter: Payer: Self-pay | Admitting: Internal Medicine

## 2023-07-22 ENCOUNTER — Ambulatory Visit: Admitting: Internal Medicine

## 2023-07-22 VITALS — BP 144/72 | HR 67 | Temp 98.1°F | Ht 66.0 in | Wt 321.0 lb

## 2023-07-22 DIAGNOSIS — K5903 Drug induced constipation: Secondary | ICD-10-CM | POA: Diagnosis not present

## 2023-07-22 DIAGNOSIS — I119 Hypertensive heart disease without heart failure: Secondary | ICD-10-CM | POA: Diagnosis not present

## 2023-07-22 DIAGNOSIS — Z0000 Encounter for general adult medical examination without abnormal findings: Secondary | ICD-10-CM | POA: Insufficient documentation

## 2023-07-22 DIAGNOSIS — N926 Irregular menstruation, unspecified: Secondary | ICD-10-CM

## 2023-07-22 DIAGNOSIS — T451X5A Adverse effect of antineoplastic and immunosuppressive drugs, initial encounter: Secondary | ICD-10-CM | POA: Insufficient documentation

## 2023-07-22 DIAGNOSIS — E7800 Pure hypercholesterolemia, unspecified: Secondary | ICD-10-CM

## 2023-07-22 DIAGNOSIS — Z170 Estrogen receptor positive status [ER+]: Secondary | ICD-10-CM

## 2023-07-22 DIAGNOSIS — I700 Atherosclerosis of aorta: Secondary | ICD-10-CM

## 2023-07-22 DIAGNOSIS — R112 Nausea with vomiting, unspecified: Secondary | ICD-10-CM | POA: Insufficient documentation

## 2023-07-22 DIAGNOSIS — F4323 Adjustment disorder with mixed anxiety and depressed mood: Secondary | ICD-10-CM

## 2023-07-22 DIAGNOSIS — C50511 Malignant neoplasm of lower-outer quadrant of right female breast: Secondary | ICD-10-CM

## 2023-07-22 NOTE — Patient Instructions (Signed)

## 2023-07-22 NOTE — Assessment & Plan Note (Signed)

## 2023-07-22 NOTE — Assessment & Plan Note (Signed)
Chronic, LDL goal is less than 70.  She will continue with rosuvastatin daily.

## 2023-07-22 NOTE — Assessment & Plan Note (Addendum)
 Chronic, uncontrolled. Suboptimal management due to inconsistent spironolactone  use. Elevated blood pressure likely from irregular intake. Currently on valsartan  and spironolactone ; chlorthalidone  not taken. Lasix  reserved for edema. - Ensure regular spironolactone  intake. - Schedule nurse visit in two weeks for blood pressure reassessment. -Check BMP in 2 weeks - Consider additional antihypertensive if hypertension persists.

## 2023-07-22 NOTE — Progress Notes (Signed)
 I,Victoria T Basil Lim, CMA,acting as a Neurosurgeon for Smiley Dung, MD.,have documented all relevant documentation on the behalf of Smiley Dung, MD,as directed by  Smiley Dung, MD while in the presence of Smiley Dung, MD.  Subjective:    Patient ID: Pamela [name] , female    DOB: 1965/12/24 , 58 y.o.   MRN: [id number]  Chief Complaint  Patient presents with   Annual Exam    Patient presents today for annual exam. She reports compliance with medications. Denies headache, chest pain & sob. EKG completed with cardiology in Feb.  Pt states she is sch for her pap next month with her GYN.  Pt also will like her ears flushed GYN: Leandra Pro   Hypertension   Hyperlipidemia    HPI Discussed the use of AI scribe software for clinical note transcription with the patient, who gave verbal consent to proceed.  History of Present Illness Pamela [name] is a 57 year old female with hypertension who presents for a physical exam.    She has been experiencing nausea for the past three days, severe enough to prevent her from keeping anything down. The nausea is exacerbated by taking her medication without food and is more pronounced this time, waking her up at night. She underwent chemotherapy two weeks ago, which she associates with her current gastrointestinal symptoms. She has been prescribed a nausea medication with her chemotherapy, which she believes causes constipation.  She experiences constipation for three to five days post-treatment, leading to straining and rectal bleeding. She has tried Zofran , which causes constipation, and ginger tea and mints for nausea relief. She has not been taking Miralax  regularly.  She is currently taking valsartan  160 mg and spironolactone  for hypertension. She takes spironolactone  only when she experiences swelling, although it is needed for blood pressure control. She has not been taking chlorthalidone  as advised by a previous  doctor. She has a six-month supply of spironolactone  prescribed in February.  She is also managing depression and anxiety, for which she sees a psychiatrist monthly and attends counseling weekly. She has been prescribed venlafaxine , lorazepam , and Lexapro, and has recently received approval from her oncologist to take these medications regularly. Her depression score has increased, which she attributes to work-related stress.  She describes a challenging work environment, with unfair treatment and increased responsibilities, such as being on call for walk-ins three days in a row. She has been in contact with HR and is considering legal advice regarding her work situation.      Hypertension This is a chronic problem. The current episode started more than 1 year ago. The problem has been gradually improving since onset. The problem is controlled. Pertinent negatives include no blurred vision, chest pain, palpitations or shortness of breath. Risk factors for coronary artery disease include obesity and sedentary lifestyle.     Past Medical History:  Diagnosis Date   Anxiety    Asthma    Back pain    Breast cancer (HCC)    Constipation    DUB (dysfunctional uterine bleeding) 2007   Edema of both lower extremities    Elevated cholesterol    Family history of breast cancer 05/12/2020   Family history of lung cancer 05/12/2020   Food allergy    Grade I diastolic dysfunction 04/30/2023   H/O menorrhagia 05/01/2006   High cholesterol    History of ovarian cyst 2007   Hypertension 03/31/2005   Increased BMI 07/11/2006   Joint pain  Migraine    N&V (nausea and vomiting)    Obesity 2007   Obstructive sleep apnea syndrome, moderate 10/27/2013   no cpap   Personal history of chemotherapy    Personal history of radiation therapy    Sleep apnea    SOB (shortness of breath)    Vitamin D  deficiency    Vitamin D  deficiency    Weight loss 07/11/2006     Family History  Problem Relation  Age of Onset   Hypertension Mother    High Cholesterol Mother    Thyroid  disease Mother    Cancer Mother    Sleep apnea Mother    Hypertension Father    Heart attack Father    Sudden death Father    Diabetes Maternal Grandmother    Bone cancer Maternal Grandfather        dx after 71   Breast cancer Maternal Aunt        dx 30s   Lung cancer Maternal Aunt        dx after 50     Current Outpatient Medications:    albuterol  (VENTOLIN  HFA) 108 (90 Base) MCG/ACT inhaler, Inhale 2 puffs into the lungs every 6 (six) hours as needed for wheezing or shortness of breath., Disp: 8 g, Rfl: 2   aspirin  EC 81 MG tablet, Take 1 tablet (81 mg total) by mouth daily. Swallow whole., Disp: 30 tablet, Rfl: 11   benzonatate  (TESSALON ) 100 MG capsule, Take 1 capsule (100 mg total) by mouth 3 (three) times daily as needed for cough., Disp: 30 capsule, Rfl: 0   escitalopram (LEXAPRO) 10 MG tablet, Take 10 mg by mouth daily. Take one tablet by mouth once daily., Disp: , Rfl:    levonorgestrel  (MIRENA , 52 MG,) 20 MCG/DAY IUD, 1 each by Intrauterine route once., Disp: , Rfl:    lidocaine -prilocaine  (EMLA ) cream, Apply to affected area once AS DIRECTED, Disp: 30 g, Rfl: 3   LORazepam  (ATIVAN ) 0.5 MG tablet, Take 0.5 mg by mouth daily. Take one tablet by mouth once daily as needed., Disp: , Rfl:    ondansetron  (ZOFRAN ) 8 MG tablet, Take 1 tablet (8 mg total) by mouth 2 (two) times daily as needed for refractory nausea / vomiting. Start on day 3 after chemo., Disp: 30 tablet, Rfl: 1   potassium chloride  (MICRO-K ) 10 MEQ CR capsule, Take 40 mEq by mouth daily., Disp: , Rfl:    rosuvastatin  (CRESTOR ) 40 MG tablet, Take 1 tablet (40 mg total) by mouth daily., Disp: 90 tablet, Rfl: 3   Spacer/Aero-Holding Chambers (AEROCHAMBER PLUS) Device, Please dispense spacer for inhaler use, Disp: 1 each, Rfl: 0   spironolactone  (ALDACTONE ) 25 MG tablet, Take 1 tablet (25 mg total) by mouth daily., Disp: 30 tablet, Rfl: 6    valsartan  (DIOVAN ) 160 MG tablet, Take 1 tablet (160 mg total) by mouth daily., Disp: 30 tablet, Rfl: 11   venlafaxine  XR (EFFEXOR -XR) 37.5 MG 24 hr capsule, Take 37.5 mg by mouth every morning., Disp: , Rfl:    furosemide  (LASIX ) 20 MG tablet, Take 1 tablet (20 mg total) by mouth as needed. TAKE 1 TABLET AS NEEDED FOR SWELLING OR SHORTNESS OF BREATH OR WEIGHT GAIN. 3 POUNDS WITHIN 24HR OR 5 POUNDS 7 DAYS (Patient not taking: Reported on 06/12/2023), Disp: 90 tablet, Rfl: 3 No current facility-administered medications for this visit.  Facility-Administered Medications Ordered in Other Visits:    heparin  lock flush 100 unit/mL, 500 Units, Intracatheter, Once, Gudena, Vinay, MD   sodium  chloride flush (NS) 0.9 % injection 10 mL, 10 mL, Intracatheter, Once, Gudena, Vinay, MD   Allergies  Allergen Reactions   Pollen Extract Shortness Of Breath   Compazine  [Prochlorperazine ] Anxiety    IV compazine  causes severe anxiety attack   Latex Rash   Codeine Hives   Diflucan  [Fluconazole ] Hives   Shellfish Allergy Hives   Betadine [Povidone Iodine] Rash      The patient states she uses none for birth control. No LMP recorded.. Negative for Dysmenorrhea. Negative for: breast discharge, breast lump(s), breast pain and breast self exam. Associated symptoms include abnormal vaginal bleeding. Pertinent negatives include abnormal bleeding (hematology), anxiety, decreased libido, depression, difficulty falling sleep, dyspareunia, history of infertility, nocturia, sexual dysfunction, sleep disturbances, urinary incontinence, urinary urgency, vaginal discharge and vaginal itching. Diet regular.The patient states her exercise level is  intermittent.  . The patient's tobacco use is:  Social History   Tobacco Use  Smoking Status Never  Smokeless Tobacco Never  . She has been exposed to passive smoke. The patient's alcohol use is:  Social History   Substance and Sexual Activity  Alcohol Use No  .    Review of  Systems  Constitutional: Negative.   HENT: Negative.    Eyes: Negative.  Negative for blurred vision.  Respiratory: Negative.  Negative for shortness of breath.   Cardiovascular: Negative.  Negative for chest pain and palpitations.  Gastrointestinal: Negative.   Endocrine: Negative.   Genitourinary: Negative.        She has irregular menses. Hasn't been checked for menopause.  Musculoskeletal: Negative.   Skin: Negative.   Allergic/Immunologic: Negative.   Neurological: Negative.   Hematological: Negative.   Psychiatric/Behavioral: Negative.       Today's Vitals   07/22/23 1210 07/22/23 1231  BP: (!) 142/90 (!) 144/72  Pulse: 67   Temp: 98.1 F (36.7 C)   Weight: (!) 321 lb (145.6 kg)   Height: 5\' 6"  (1.676 m)    Body mass index is 51.81 kg/m.  Wt Readings from Last 3 Encounters:  07/22/23 (!) 321 lb (145.6 kg)  07/12/23 (!) 322 lb 8 oz (146.3 kg)  06/12/23 (!) 316 lb 4.8 oz (143.5 kg)    BP Readings from Last 3 Encounters:  07/22/23 (!) 144/72  07/12/23 (!) 141/65  06/12/23 (!) 168/80    Objective:  Physical Exam Vitals and nursing note reviewed.  Constitutional:      Appearance: Normal appearance. She is obese.  HENT:     Head: Normocephalic and atraumatic.     Right Ear: Tympanic membrane, ear canal and external ear normal.     Left Ear: Tympanic membrane, ear canal and external ear normal.     Nose: Nose normal.     Mouth/Throat:     Mouth: Mucous membranes are moist.     Pharynx: Oropharynx is clear.  Eyes:     Extraocular Movements: Extraocular movements intact.     Conjunctiva/sclera: Conjunctivae normal.     Pupils: Pupils are equal, round, and reactive to light.  Cardiovascular:     Rate and Rhythm: Normal rate and regular rhythm.     Pulses: Normal pulses.     Heart sounds: Normal heart sounds.  Pulmonary:     Effort: Pulmonary effort is normal.     Breath sounds: Normal breath sounds.  Chest:  Breasts:    Tanner Score is 5.     Left:  Normal.     Comments: Healed surgical scars b/l Abdominal:  General: Abdomen is flat. Bowel sounds are normal.     Palpations: Abdomen is soft.     Comments: Obese, soft  Genitourinary:    Comments: deferred Musculoskeletal:        General: Normal range of motion.     Cervical back: Normal range of motion and neck supple.  Skin:    General: Skin is warm and dry.  Neurological:     General: No focal deficit present.     Mental Status: She is alert and oriented to person, place, and time.  Psychiatric:        Mood and Affect: Mood normal.        Behavior: Behavior normal.         Assessment And Plan:     Encounter for annual health examination Assessment & Plan: A full exam was performed.  Importance of monthly self breast exams was discussed with the patient.  She is advised to get 30-45 minutes of regular exercise, no less than four to five days per week. Both weight-bearing and aerobic exercises are recommended.  She is advised to follow a healthy diet with at least six fruits/veggies per day, decrease intake of red meat and other saturated fats and to increase fish intake to twice weekly.  Meats/fish should not be fried -- baked, boiled or broiled is preferable. It is also important to cut back on your sugar intake.  Be sure to read labels - try to avoid anything with added sugar, high fructose corn syrup or other sweeteners.  If you must use a sweetener, you can try stevia or monkfruit.  It is also important to avoid artificially sweetened foods/beverages and diet drinks. Lastly, wear SPF 50 sunscreen on exposed skin and when in direct sunlight for an extended period of time.  Be sure to avoid fast food restaurants and aim for at least 60 ounces of water daily.      Orders: -     Lipid panel; Future  Hypertensive heart disease without heart failure Assessment & Plan: Chronic, uncontrolled. Suboptimal management due to inconsistent spironolactone  use. Elevated blood pressure  likely from irregular intake. Currently on valsartan  and spironolactone ; chlorthalidone  not taken. Lasix  reserved for edema. - Ensure regular spironolactone  intake. - Schedule nurse visit in two weeks for blood pressure reassessment. -Check BMP in 2 weeks - Consider additional antihypertensive if hypertension persists.   Aortic atherosclerosis (HCC) Assessment & Plan: Chronic, LDL goal is less than 70.  She will continue with rosuvastatin  daily.    Drug-induced constipation Assessment & Plan: Severe constipation linked to ondansetron , causing straining and rectal bleeding. Miralax  recommended. - Use Miralax  on Monday, Wednesday, Friday. - Consult oncology for alternative antiemetic options without constipation.   Chemotherapy induced nausea and vomiting Assessment & Plan: Chronic, unfortunately pharmacologic therapy has not been helpful.  Persistent nausea and vomiting post-chemotherapy, worsened at night. Ondansetron  effective but causes constipation. Ginger tea and mints provide some relief. - Continue ginger tea and mints. - Consult oncology for alternative antiemetic options without constipation.   Malignant neoplasm of lower-outer quadrant of right breast of female, estrogen receptor positive (HCC) Assessment & Plan: Chronic, initially s/p R lumpectomy, chemo and XRT.  Diagnosed with metastatic breast cancer in 2023. Currently being treated with Enhurtu.    Irregular menses -     Follicle stimulating hormone; Future  Adjustment disorder with mixed anxiety and depressed mood Assessment & Plan: Depression likely worsened by work-related stress. Under psychiatric care with venlafaxine , lorazepam , and Lexapro. Engaged  in counseling and monthly psychiatric consultations. Oncologist approved continuation of psychiatric medications. - Maintain current psychiatric medications. - Continue weekly counseling sessions. - Monthly follow-up with psychiatrist.    Return in 2 weeks  (on 08/05/2023), or bp check w/ NV, bmp dx: z79.899, for 1 year physical, 6 month bp. Patient was given opportunity to ask questions. Patient verbalized understanding of the plan and was able to repeat key elements of the plan. All questions were answered to their satisfaction.   I, Smiley Dung, MD, have reviewed all documentation for this visit. The documentation on 07/22/23 for the exam, diagnosis, procedures, and orders are all accurate and complete.

## 2023-07-23 ENCOUNTER — Other Ambulatory Visit: Payer: Self-pay

## 2023-07-27 DIAGNOSIS — F4323 Adjustment disorder with mixed anxiety and depressed mood: Secondary | ICD-10-CM | POA: Insufficient documentation

## 2023-07-27 NOTE — Assessment & Plan Note (Signed)
Chronic, initially s/p R lumpectomy, chemo and XRT.  Diagnosed with metastatic breast cancer in 2023. Currently being treated with Enhurtu.

## 2023-07-27 NOTE — Assessment & Plan Note (Signed)
 Severe constipation linked to ondansetron , causing straining and rectal bleeding. Miralax  recommended. - Use Miralax  on Monday, Wednesday, Friday. - Consult oncology for alternative antiemetic options without constipation.

## 2023-07-27 NOTE — Assessment & Plan Note (Addendum)
 Chronic, unfortunately pharmacologic therapy has not been helpful.  Persistent nausea and vomiting post-chemotherapy, worsened at night. Ondansetron  effective but causes constipation. Ginger tea and mints provide some relief. - Continue ginger tea and mints. - Consult oncology for alternative antiemetic options without constipation.

## 2023-07-27 NOTE — Assessment & Plan Note (Signed)
 Depression likely worsened by work-related stress. Under psychiatric care with venlafaxine , lorazepam , and Lexapro. Engaged in counseling and monthly psychiatric consultations. Oncologist approved continuation of psychiatric medications. - Maintain current psychiatric medications. - Continue weekly counseling sessions. - Monthly follow-up with psychiatrist.

## 2023-08-05 ENCOUNTER — Telehealth: Payer: Self-pay

## 2023-08-05 MED FILL — Fosaprepitant Dimeglumine For IV Infusion 150 MG (Base Eq): INTRAVENOUS | Qty: 5 | Status: AC

## 2023-08-05 NOTE — Telephone Encounter (Signed)
 Notified the pt regarding her FMLA forms being completed,faxed,and confirmation received. Pt will pick up her copy on tomorrow 08/06/2023. No questions or concerns at this time.

## 2023-08-06 ENCOUNTER — Inpatient Hospital Stay: Payer: 59

## 2023-08-06 ENCOUNTER — Inpatient Hospital Stay: Payer: 59 | Admitting: Hematology and Oncology

## 2023-08-06 ENCOUNTER — Telehealth: Payer: Self-pay | Admitting: *Deleted

## 2023-08-06 ENCOUNTER — Encounter: Payer: Self-pay | Admitting: Hematology and Oncology

## 2023-08-06 ENCOUNTER — Ambulatory Visit

## 2023-08-06 NOTE — Telephone Encounter (Signed)
 RN placed call to pt regarding no show for appts today. Pt states she overslept.  Appts rescheduled per infusion room availability, pt notified and verbalized understanding.

## 2023-08-06 NOTE — Assessment & Plan Note (Deleted)
 06/02/2020:Right lumpectomy ([facility]): invasive and in situ ductal carcinoma, 1.3cm, clear margins, 1 right axillary lymph node negative for carcinoma. ER 15% weak, PR-,HER-2 positive (3+) Ki67 80%.   Treatment plan: 1.  Adjuvant chemotherapy with Taxol  Herceptin  followed by Herceptin  maintenance.  Stopped after 11 cycles of Taxol  Herceptin  maintenance completed 07/07/2021  2.  Adjuvant radiation therapy 11/23/2020-12/19/2020 3.  Adjuvant antiestrogen therapy with tamoxifen  started October 2022 4.  CT angiogram 07/13/2021: Right supraclavicular lymph node 5 cm, right internal mammary lymph nodes 1.8 cm 5.  Metastatic breast cancer: June 2023:Right supraclavicular lymphadenopathy: Biopsy: Metastatic poorly differentiated adenocarcinoma, ER +40%, PR negative, HER2 positive 3+ PET CT scan 08/06/21: Ext Right Supra clav LN along with Internal mammary and Rt Upper Mediastinal and Rt Axillary LN    CT chest abdomen pelvis 06/25/2022: Stable exam.  No new or progressive findings. CT CAP 10/04/2022: Stable findings without new or progressive disease CT CAP 02/22/2023: Stable findings without new or progressive disease.  Pneumonia that was noted on chest x-ray from earlier in the month was again noted. -----------------------------------------------------------------------------------------------------  Current treatment: Enhertu  started on 08/21/2021 now every 4 weeks, today cycle 27 Enhertu  toxicities:   Hospitalization 04/29/2023-05/04/2023: Community-acquired pneumonia Chemo-induced peripheral neuropathy   CT CAP 06/10/2023: Small supraclavicular lymph node on the left 7 mm, right axillary nodular thickening 1.5 cm overall stable disease, and remained in lung infiltrates.   Echocardiogram every 4 months based upon Dr. Andria Keeler recommendation.   Return to clinic in 4 weeks for labs and Enhertu 

## 2023-08-07 ENCOUNTER — Inpatient Hospital Stay: Admission: RE | Admit: 2023-08-07 | Source: Ambulatory Visit

## 2023-08-08 ENCOUNTER — Ambulatory Visit
Admission: RE | Admit: 2023-08-08 | Discharge: 2023-08-08 | Disposition: A | Discharge status: Home / Self Care | Source: Ambulatory Visit | Attending: Obstetrics and Gynecology | Admitting: Obstetrics and Gynecology

## 2023-08-08 DIAGNOSIS — Z853 Personal history of malignant neoplasm of breast: Secondary | ICD-10-CM

## 2023-08-08 DIAGNOSIS — Z9889 Other specified postprocedural states: Secondary | ICD-10-CM

## 2023-08-13 ENCOUNTER — Inpatient Hospital Stay: Attending: Hematology and Oncology

## 2023-08-13 ENCOUNTER — Inpatient Hospital Stay

## 2023-08-13 ENCOUNTER — Inpatient Hospital Stay: Attending: Hematology and Oncology | Admitting: Adult Health

## 2023-08-13 ENCOUNTER — Encounter: Payer: Self-pay | Admitting: Adult Health

## 2023-08-13 ENCOUNTER — Encounter: Payer: Self-pay | Admitting: Hematology and Oncology

## 2023-08-13 VITALS — BP 147/58 | HR 75 | Temp 97.3°F | Resp 19 | Ht 66.0 in | Wt 320.7 lb

## 2023-08-13 DIAGNOSIS — Z1722 Progesterone receptor negative status: Secondary | ICD-10-CM | POA: Insufficient documentation

## 2023-08-13 DIAGNOSIS — Z923 Personal history of irradiation: Secondary | ICD-10-CM | POA: Diagnosis not present

## 2023-08-13 DIAGNOSIS — E782 Mixed hyperlipidemia: Secondary | ICD-10-CM | POA: Insufficient documentation

## 2023-08-13 DIAGNOSIS — Z95828 Presence of other vascular implants and grafts: Secondary | ICD-10-CM

## 2023-08-13 DIAGNOSIS — I10 Essential (primary) hypertension: Secondary | ICD-10-CM | POA: Diagnosis not present

## 2023-08-13 DIAGNOSIS — R110 Nausea: Secondary | ICD-10-CM

## 2023-08-13 DIAGNOSIS — R609 Edema, unspecified: Secondary | ICD-10-CM | POA: Diagnosis not present

## 2023-08-13 DIAGNOSIS — M25472 Effusion, left ankle: Secondary | ICD-10-CM | POA: Diagnosis not present

## 2023-08-13 DIAGNOSIS — G4733 Obstructive sleep apnea (adult) (pediatric): Secondary | ICD-10-CM | POA: Insufficient documentation

## 2023-08-13 DIAGNOSIS — Z5112 Encounter for antineoplastic immunotherapy: Secondary | ICD-10-CM | POA: Diagnosis present

## 2023-08-13 DIAGNOSIS — Z801 Family history of malignant neoplasm of trachea, bronchus and lung: Secondary | ICD-10-CM | POA: Diagnosis not present

## 2023-08-13 DIAGNOSIS — Z79899 Other long term (current) drug therapy: Secondary | ICD-10-CM | POA: Diagnosis not present

## 2023-08-13 DIAGNOSIS — M25471 Effusion, right ankle: Secondary | ICD-10-CM | POA: Insufficient documentation

## 2023-08-13 DIAGNOSIS — Z8249 Family history of ischemic heart disease and other diseases of the circulatory system: Secondary | ICD-10-CM | POA: Insufficient documentation

## 2023-08-13 DIAGNOSIS — Z825 Family history of asthma and other chronic lower respiratory diseases: Secondary | ICD-10-CM | POA: Insufficient documentation

## 2023-08-13 DIAGNOSIS — M7989 Other specified soft tissue disorders: Secondary | ICD-10-CM | POA: Diagnosis not present

## 2023-08-13 DIAGNOSIS — Z83438 Family history of other disorder of lipoprotein metabolism and other lipidemia: Secondary | ICD-10-CM | POA: Diagnosis not present

## 2023-08-13 DIAGNOSIS — Z170 Estrogen receptor positive status [ER+]: Secondary | ICD-10-CM | POA: Insufficient documentation

## 2023-08-13 DIAGNOSIS — C50511 Malignant neoplasm of lower-outer quadrant of right female breast: Secondary | ICD-10-CM | POA: Diagnosis present

## 2023-08-13 DIAGNOSIS — Z8349 Family history of other endocrine, nutritional and metabolic diseases: Secondary | ICD-10-CM | POA: Insufficient documentation

## 2023-08-13 DIAGNOSIS — I700 Atherosclerosis of aorta: Secondary | ICD-10-CM | POA: Diagnosis not present

## 2023-08-13 DIAGNOSIS — C50919 Malignant neoplasm of unspecified site of unspecified female breast: Secondary | ICD-10-CM

## 2023-08-13 DIAGNOSIS — Z9221 Personal history of antineoplastic chemotherapy: Secondary | ICD-10-CM | POA: Diagnosis not present

## 2023-08-13 DIAGNOSIS — Z803 Family history of malignant neoplasm of breast: Secondary | ICD-10-CM | POA: Diagnosis not present

## 2023-08-13 DIAGNOSIS — J45909 Unspecified asthma, uncomplicated: Secondary | ICD-10-CM | POA: Insufficient documentation

## 2023-08-13 DIAGNOSIS — Z833 Family history of diabetes mellitus: Secondary | ICD-10-CM | POA: Diagnosis not present

## 2023-08-13 DIAGNOSIS — Z5986 Financial insecurity: Secondary | ICD-10-CM | POA: Insufficient documentation

## 2023-08-13 LAB — CBC WITH DIFFERENTIAL (CANCER CENTER ONLY)
Abs Immature Granulocytes: 0.01 10*3/uL (ref 0.00–0.07)
Basophils Absolute: 0 10*3/uL (ref 0.0–0.1)
Basophils Relative: 1 %
Eosinophils Absolute: 0.2 10*3/uL (ref 0.0–0.5)
Eosinophils Relative: 7 %
HCT: 35.8 % — ABNORMAL LOW (ref 36.0–46.0)
Hemoglobin: 11.9 g/dL — ABNORMAL LOW (ref 12.0–15.0)
Immature Granulocytes: 0 %
Lymphocytes Relative: 29 %
Lymphs Abs: 0.9 10*3/uL (ref 0.7–4.0)
MCH: 27.9 pg (ref 26.0–34.0)
MCHC: 33.2 g/dL (ref 30.0–36.0)
MCV: 83.8 fL (ref 80.0–100.0)
Monocytes Absolute: 0.5 10*3/uL (ref 0.1–1.0)
Monocytes Relative: 16 %
Neutro Abs: 1.5 10*3/uL — ABNORMAL LOW (ref 1.7–7.7)
Neutrophils Relative %: 47 %
Platelet Count: 127 10*3/uL — ABNORMAL LOW (ref 150–400)
RBC: 4.27 MIL/uL (ref 3.87–5.11)
RDW: 16 % — ABNORMAL HIGH (ref 11.5–15.5)
WBC Count: 3.2 10*3/uL — ABNORMAL LOW (ref 4.0–10.5)
nRBC: 0 % (ref 0.0–0.2)

## 2023-08-13 LAB — CMP (CANCER CENTER ONLY)
ALT: 28 U/L (ref 0–44)
AST: 44 U/L — ABNORMAL HIGH (ref 15–41)
Albumin: 3.3 g/dL — ABNORMAL LOW (ref 3.5–5.0)
Alkaline Phosphatase: 94 U/L (ref 38–126)
Anion gap: 4 — ABNORMAL LOW (ref 5–15)
BUN: 9 mg/dL (ref 6–20)
CO2: 30 mmol/L (ref 22–32)
Calcium: 8.8 mg/dL — ABNORMAL LOW (ref 8.9–10.3)
Chloride: 109 mmol/L (ref 98–111)
Creatinine: 0.65 mg/dL (ref 0.44–1.00)
GFR, Estimated: >60 mL/min (ref >60)
Glucose, Bld: 96 mg/dL (ref 70–99)
Potassium: 3.2 mmol/L — ABNORMAL LOW (ref 3.5–5.1)
Sodium: 143 mmol/L (ref 135–145)
Total Bilirubin: 1.7 mg/dL — ABNORMAL HIGH (ref 0.0–1.2)
Total Protein: 6.3 g/dL — ABNORMAL LOW (ref 6.5–8.1)

## 2023-08-13 MED ORDER — HEPARIN SOD (PORK) LOCK FLUSH 100 UNIT/ML IV SOLN
500.0000 [IU] | Freq: Once | INTRAVENOUS | Status: AC | PRN
  Administered 2023-08-13: 500 [IU]

## 2023-08-13 MED ORDER — PALONOSETRON HCL INJECTION 0.25 MG/5ML
0.2500 mg | Freq: Once | INTRAVENOUS | Status: AC
  Administered 2023-08-13: 0.25 mg via INTRAVENOUS
  Filled 2023-08-13: qty 5

## 2023-08-13 MED ORDER — SODIUM CHLORIDE 0.9% FLUSH
10.0000 mL | INTRAVENOUS | Status: DC | PRN
  Administered 2023-08-13: 10 mL

## 2023-08-13 MED ORDER — ACETAMINOPHEN 325 MG PO TABS
650.0000 mg | ORAL_TABLET | Freq: Once | ORAL | Status: AC
  Administered 2023-08-13: 650 mg via ORAL
  Filled 2023-08-13: qty 2

## 2023-08-13 MED ORDER — DEXTROSE 5 % IV SOLN: Freq: Once | INTRAVENOUS | Status: AC

## 2023-08-13 MED ORDER — CYANOCOBALAMIN 1000 MCG/ML IJ SOLN
1000.0000 ug | Freq: Once | INTRAMUSCULAR | Status: AC
  Administered 2023-08-13: 1000 ug via INTRAMUSCULAR
  Filled 2023-08-13: qty 1

## 2023-08-13 MED ORDER — SODIUM CHLORIDE 0.9 % IV SOLN
150.0000 mg | Freq: Once | INTRAVENOUS | Status: AC
  Administered 2023-08-13: 150 mg via INTRAVENOUS
  Filled 2023-08-13: qty 150
  Filled 2023-08-13: qty 5

## 2023-08-13 MED ORDER — FAM-TRASTUZUMAB DERUXTECAN-NXKI CHEMO 100 MG IV SOLR
300.0000 mg | Freq: Once | INTRAVENOUS | Status: AC
  Administered 2023-08-13: 300 mg via INTRAVENOUS
  Filled 2023-08-13: qty 15

## 2023-08-13 MED ORDER — SODIUM CHLORIDE 0.9% FLUSH: 10.0000 mL | Freq: Once | INTRAVENOUS | Status: DC

## 2023-08-13 NOTE — Progress Notes (Signed)
 OK to treat D1/C16 Enhertu  w/ Bili 1.7 per Dr. Gudena

## 2023-08-13 NOTE — Progress Notes (Signed)
 Potassium 3.2. Per Loris Ros NP, pt to take potassium 20 mEq BID for three days. On days when she takes lasix  at home she is to increase potassium 65m Eq once a day to twice per day

## 2023-08-13 NOTE — Assessment & Plan Note (Signed)
 06/02/2020:Right lumpectomy ([facility]): invasive and in situ ductal carcinoma, 1.3cm, clear margins, 1 right axillary lymph node negative for carcinoma. ER 15% weak, PR-,HER-2 positive (3+) Ki67 80%.   Treatment plan: 1.  Adjuvant chemotherapy with Taxol  Herceptin  followed by Herceptin  maintenance.  Stopped after 11 cycles of Taxol  Herceptin  maintenance completed 07/07/2021  2.  Adjuvant radiation therapy 11/23/2020-12/19/2020 3.  Adjuvant antiestrogen therapy with tamoxifen  started October 2022 4.  CT angiogram 07/13/2021: Right supraclavicular lymph node 5 cm, right internal mammary lymph nodes 1.8 cm 5.  Metastatic breast cancer: June 2023:Right supraclavicular lymphadenopathy: Biopsy: Metastatic poorly differentiated adenocarcinoma, ER +40%, PR negative, HER2 positive 3+ PET CT scan 08/06/21: Ext Right Supra clav LN along with Internal mammary and Rt Upper Mediastinal and Rt Axillary LN    CT chest abdomen pelvis 06/25/2022: Stable exam.  No new or progressive findings. CT CAP 10/04/2022: Stable findings without new or progressive disease CT CAP 02/22/2023: Stable findings without new or progressive disease.  Pneumonia that was noted on chest x-ray from earlier in the month was again noted. CT CAP 06/10/2023: Small supraclavicular lymph node on the left 7 mm, right axillary nodular thickening 1.5 cm overall stable disease, and remained in lung infiltrates. Echocardiogram every 4 months based upon Dr. Andria Keeler recommendation. -----------------------------------------------------------------------------------------------------  Current treatment: Enhertu  started on 08/21/2021 now every 4 weeks Enhertu  toxicities:   Hospitalization 04/29/2023-05/04/2023: Community-acquired pneumonia Chemo-induced peripheral neuropathy  Assessment and Plan Assessment & Plan Metastatic Breast cancer No signs of progression today.  Continuing on Enhertu . - Proceed with treatment today without dose reductions or  modifications so long as pending CMET is within parameters.   Hypertension Managed with valsartan  and spironolactone . Lasix  discussed for intermittent use with caution for hypokalemia. - Continue valsartan  and spironolactone . - Use Lasix  as needed for weight gain or swelling, not more than a couple of days consecutively.  Edema Edema in hands and ankles, possibly due to prolonged sitting. Dietary sodium reduction and leg elevation advised. - Limit sodium intake to less than 2000 mg per day. - Discussed foods low in sodium   Follow-up Scheduled with Dr. Lorayne Rocks for echocardiogram and consultation. - Attend echocardiogram and follow-up appointment with Dr. Lorayne Rocks on August 26, 2023. - Follow-up in 4 weeks for labs, evaluation and next treatment.

## 2023-08-13 NOTE — Progress Notes (Signed)
 [facility] Cancer Follow up:    [name], MD [address] [place] [address]   DIAGNOSIS:  Cancer Staging  Malignant neoplasm of lower-outer quadrant of right breast of female, estrogen receptor positive (HCC) Staging form: Breast, AJCC 8th Edition - Clinical stage from 05/11/2020: Stage IA (cT1b, cN0, cM0, G3, ER+, PR-, HER2+) - Signed by Cameron Cea, MD on 05/11/2020 Stage prefix: Initial diagnosis - Pathologic stage from 06/02/2020: Stage IA (pT1c, pN0, cM0, G3, ER+, PR-, HER2+) - Signed by Percival Brace, NP on 06/15/2020 Stage prefix: Initial diagnosis Histologic grading system: 3 grade system    SUMMARY OF ONCOLOGIC HISTORY: Oncology History  Malignant neoplasm of lower-outer quadrant of right breast of female, estrogen receptor positive (HCC)  05/03/2020 Initial Diagnosis   Screening mammogram showed a 1.0cm mass at the 6 o'clock position in the right breast. Diagnostic mammogram and US  showed the 1.2cm mass at the 6 o'clock position in the right breast. Biopsy showed IDC, grade 3, HER-2 positive (3+), ER 15% weak, PR-, Ki67 80%.   05/19/2020 Genetic Testing   Negative hereditary cancer genetic testing: no pathogenic variants detected in [facility] Breast STAT Panel and Common Hereditary Cancers Panel.  The report dates are May 19, 2020 (STAT) and May 23, 2020 (Common Hereditary).   The Common Hereditary Cancers Panel offered by [facility] includes sequencing and/or deletion duplication testing of the following 47 genes: APC, ATM, AXIN2, BARD1, BMPR1A, BRCA1, BRCA2, BRIP1, CDH1, CDK4, CDKN2A (p14ARF), CDKN2A (p16INK4a), CHEK2, CTNNA1, DICER1, EPCAM (Deletion/duplication testing only), GREM1 (promoter region deletion/duplication testing only), GREM1, HOXB13, KIT, MEN1, MLH1, MSH2, MSH3, MSH6, MUTYH, NBN, NF1, NHTL1, PALB2, PDGFRA, PMS2, POLD1, POLE, PTEN, RAD50, RAD51C, RAD51D, SDHA, SDHB, SDHC, SDHD, SMAD4, SMARCA4. STK11, TP53, TSC1, TSC2, and  VHL.  The following genes were evaluated for sequence changes only: SDHA and HOXB13 c.251G>A variant only.es only: SDHA and HOXB13 c.251G>A variant only.   06/02/2020 Surgery   Right lumpectomy ([facility]): invasive and in situ ductal carcinoma, 1.3cm, clear margins, 1 right axillary lymph node negative for carcinoma.   06/02/2020 Cancer Staging   Staging form: Breast, AJCC 8th Edition - Pathologic stage from 06/02/2020: Stage IA (pT1c, pN0, cM0, G3, ER+, PR-, HER2+) - Signed by Percival Brace, NP on 06/15/2020 Stage prefix: Initial diagnosis Histologic grading system: 3 grade system   07/08/2020 - 10/21/2020 Chemotherapy   Taxol  Herceptin  followed by Herceptin  maintenance   04/14/2021 -  Anti-estrogen oral therapy   Tamoxifen  10 mg   07/13/2021 Imaging   CT angiogram: Right supraclavicular lymph node 5 cm, right internal mammary lymph nodes 1.8 cm    08/06/2021 PET scan   PET CT scan: Ext Right Supra clav LN along with Internal mammary and Rt Upper Mediastinal and Rt Axillary LN   08/2021 Initial Biopsy   Right supraclavicular lymphadenopathy: Biopsy: Metastatic poorly differentiated adenocarcinoma, ER +40%, PR negative, HER2 positive 3+    08/21/2021 -  Chemotherapy   Enhertu  every 3 weeks    03/08/2022 Imaging   CT chest/abdomen/pelvis  1. No new or progressive findings in the chest, abdomen or pelvis. 2. Signs of RIGHT breast lumpectomy with areas of central fat necrosis unchanged from previous imaging. 3. Stable small lymph nodes throughout the chest, largest with area of soft tissue about surgical clips in the RIGHT axilla. 4. Stable appearance of thoracic inlet lymph nodes, not shown to be hypermetabolic on prior PET imaging. 5. IUD in-situ. 6. Aortic atherosclerosis.   08/21/2022 Imaging  Echocardiogram  -normal LVEF at 60-65%. -no left ventricular wall motion abnormalities  -normal left ventricular diastolic function  -mild left ventricular hypertrophy.    10/05/2022  Imaging   CT Chest, Abdomen, and Pelvis with contrast IMPRESSION: 1. Stable examination without new or progressive finding in the chest, abdomen or pelvis to suggest recurrent or metastatic disease. 2. Stable tiny bilateral thoracic inlet lymph nodes. 3.  Aortic Atherosclerosis (ICD10-I70.0).     CURRENT THERAPY: Enhertu   INTERVAL HISTORY:  Discussed the use of AI scribe software for clinical note transcription with the patient, who gave verbal consent to proceed.  History of Present Illness Pamela [name] is a 58 year old female who presents for follow-up prior to receiving Enhertu .  Her most recent echocardiogram on April 11, 2023, shows a normal ejection fraction. A repeat echocardiogram  and Dr. Julane Ny appointment is scheduled for August 26, 2023. She is concerned about her blood pressure and is unsure about her medication regimen. She is prescribed Lasix  as needed for weight gain or swelling but has not been taking it. She is also on valsartan  and spironolactone  but has not been taking spironolactone  recently. She experiences swelling in her hands and ankles, which she associates with prolonged periods in the office at her work.  Elevating her legs at home helps reduce the swelling. No new pain is reported. Recent symptoms include swelling in her hands and ankles, and a recent cold with a scratchy throat, runny nose, and cough.     Patient Active Problem List   Diagnosis Date Noted   Adjustment disorder with mixed anxiety and depressed mood 07/27/2023   Chemotherapy induced nausea and vomiting 07/22/2023   Encounter for annual health examination 07/22/2023   Hypertensive heart disease without heart failure 05/09/2023   Transaminitis 05/09/2023   Hypophosphatemia 04/30/2023   Thrombocytopenia (HCC) 04/30/2023   Elevated troponin 04/30/2023   Normocytic anemia 04/30/2023   Grade I diastolic dysfunction 04/30/2023   Prolonged QT interval 04/30/2023   Drug-induced  constipation 11/24/2022   Chemotherapy induced cardiomyopathy (HCC) 05/08/2022   Aortic atherosclerosis (HCC) 04/26/2022   Hypomagnesemia 11/26/2021   Hypokalemia 11/26/2021   Dizziness, fatigue, globus sensation 11/26/2021   Breast cancer (HCC) 08/25/2021   Nausea without vomiting 08/25/2021   Prediabetes 03/13/2021   Non-intractable vomiting 07/13/2020   Port-A-Cath in place 07/08/2020   Genetic testing 05/20/2020   Family history of breast cancer 05/12/2020   Family history of lung cancer 05/12/2020   Malignant neoplasm of lower-outer quadrant of right breast of female, estrogen receptor positive (HCC) 05/03/2020   Elevated troponin level not due myocardial infarction 09/18/2019   Mixed hyperlipidemia 09/18/2019   Hypertensive emergency 09/17/2019   Primary osteoarthritis of right hip 08/06/2018   Seasonal allergies 06/12/2018   Fibrocystic disease of breast 05/27/2018   Obstructive sleep apnea syndrome, moderate 10/27/2013   Shift work sleep disorder 10/27/2013   Psychic factors associated with diseases classified elsewhere 08/07/2013   Pure hypercholesterolemia 07/21/2013   Essential hypertension 07/21/2013   Snoring 07/21/2013   IUD contraception-inserted 10/01/11 10/01/2011   Class 3 severe obesity due to excess calories with serious comorbidity and body mass index (BMI) of 50.0 to 59.9 in adult 09/03/2011    is allergic to pollen extract, compazine  [prochlorperazine ], latex, codeine, diflucan  [fluconazole ], shellfish allergy, and betadine [povidone iodine].  MEDICAL HISTORY: Past Medical History:  Diagnosis Date   Anxiety    Asthma    Back pain    Breast cancer (HCC)    Constipation  DUB (dysfunctional uterine bleeding) 2007   Edema of both lower extremities    Elevated cholesterol    Family history of breast cancer 05/12/2020   Family history of lung cancer 05/12/2020   Food allergy    Grade I diastolic dysfunction 04/30/2023   H/O menorrhagia 05/01/2006    High cholesterol    History of ovarian cyst 2007   Hypertension 03/31/2005   Increased BMI 07/11/2006   Joint pain    Migraine    N&V (nausea and vomiting)    Obesity 2007   Obstructive sleep apnea syndrome, moderate 10/27/2013   no cpap   Personal history of chemotherapy    Personal history of radiation therapy    Sleep apnea    SOB (shortness of breath)    Vitamin D  deficiency    Vitamin D  deficiency    Weight loss 07/11/2006    SURGICAL HISTORY: Past Surgical History:  Procedure Laterality Date   BREAST LUMPECTOMY     BREAST LUMPECTOMY WITH RADIOACTIVE SEED AND SENTINEL LYMPH NODE BIOPSY Right 06/02/2020   Procedure: RIGHT BREAST LUMPECTOMY WITH RADIOACTIVE SEED AND RIGHT SENTINEL LYMPH NODE BIOPSY;  Surgeon: Sim Dryer, MD;  Location: [facility];  Service: General;  Laterality: Right;   BREAST REDUCTION SURGERY  05/04/1991   CESAREAN SECTION     HYSTEROSCOPY  05/01/2006   PORTACATH PLACEMENT Right 06/02/2020   Procedure: INSERTION PORT-A-CATH;  Surgeon: Sim Dryer, MD;  Location: [facility];  Service: General;  Laterality: Right;   REDUCTION MAMMAPLASTY      SOCIAL HISTORY: Social History   Socioeconomic History   Marital status: Single    Spouse name: Not on file   Number of children: Not on file   Years of education: Not on file   Highest education level: Not on file  Occupational History   Occupation: Child psychotherapist  Tobacco Use   Smoking status: Never   Smokeless tobacco: Never  Vaping Use   Vaping status: Never Used  Substance and Sexual Activity   Alcohol use: No   Drug use: Not Currently    Frequency: 2.0 times per week   Sexual activity: Not Currently    Birth control/protection: I.U.D.    Comment: Mirena   Other Topics Concern   Not on file  Social History Narrative   Not on file   Social Drivers of Health   Financial Resource Strain: High Risk (03/14/2022)   Overall Financial Resource Strain (CARDIA)     Difficulty of Paying Living Expenses: Hard  Food Insecurity: No Food Insecurity (04/30/2023)   Hunger Vital Sign    Worried About Running Out of Food in the Last Year: Never true    Ran Out of Food in the Last Year: Never true  Transportation Needs: No Transportation Needs (04/30/2023)   PRAPARE - Administrator, Civil Service (Medical): No    Lack of Transportation (Non-Medical): No  Physical Activity: Not on file  Stress: Not on file  Social Connections: Moderately Isolated (04/30/2023)   Social Connection and Isolation Panel [NHANES]    Frequency of Communication with Friends and Family: More than three times a week    Frequency of Social Gatherings with Friends and Family: More than three times a week    Attends Religious Services: More than 4 times per year    Active Member of [employer] or Organizations: No    Attends Banker Meetings: Not on file    Marital Status: Separated  Intimate  Partner Violence: Not At Risk (04/30/2023)   Humiliation, Afraid, Rape, and Kick questionnaire    Fear of Current or Ex-Partner: No    Emotionally Abused: No    Physically Abused: No    Sexually Abused: No    FAMILY HISTORY: Family History  Problem Relation Age of Onset   Hypertension Mother    High Cholesterol Mother    Thyroid  disease Mother    Cancer Mother    Sleep apnea Mother    Hypertension Father    Heart attack Father    Sudden death Father    Diabetes Maternal Grandmother    Bone cancer Maternal Grandfather        dx after 36   Breast cancer Maternal Aunt        dx 30s   Lung cancer Maternal Aunt        dx after 50    Review of Systems  Constitutional:  Negative for appetite change, chills, fatigue, fever and unexpected weight change.  HENT:   Negative for hearing loss, lump/mass and trouble swallowing.   Eyes:  Negative for eye problems and icterus.  Respiratory:  Negative for chest tightness, cough and shortness of breath.   Cardiovascular:  Positive  for leg swelling. Negative for chest pain and palpitations.  Gastrointestinal:  Negative for abdominal distention, abdominal pain, constipation, diarrhea, nausea and vomiting.  Endocrine: Negative for hot flashes.  Genitourinary:  Negative for difficulty urinating.   Musculoskeletal:  Negative for arthralgias.  Skin:  Negative for itching and rash.  Neurological:  Negative for dizziness, extremity weakness, headaches and numbness.  Hematological:  Negative for adenopathy. Does not bruise/bleed easily.  Psychiatric/Behavioral:  Negative for depression. The patient is not nervous/anxious.       PHYSICAL EXAMINATION    Vitals:   08/13/23 1130 08/13/23 1132  BP: (!) 155/67 (!) 147/58  Pulse: 75   Resp: 19   Temp: (!) 97.3 F (36.3 C)   SpO2: 100%     Physical Exam Constitutional:      General: She is not in acute distress.    Appearance: Normal appearance. She is not toxic-appearing.  HENT:     Head: Normocephalic and atraumatic.     Mouth/Throat:     Mouth: Mucous membranes are moist.     Pharynx: Oropharynx is clear. No oropharyngeal exudate or posterior oropharyngeal erythema.  Eyes:     General: No scleral icterus. Cardiovascular:     Rate and Rhythm: Normal rate and regular rhythm.     Pulses: Normal pulses.     Heart sounds: Normal heart sounds.  Pulmonary:     Effort: Pulmonary effort is normal.     Breath sounds: Normal breath sounds.  Abdominal:     General: Abdomen is flat. Bowel sounds are normal. There is no distension.     Palpations: Abdomen is soft.     Tenderness: There is no abdominal tenderness.  Musculoskeletal:        General: Swelling present.     Cervical back: Neck supple.  Lymphadenopathy:     Cervical: No cervical adenopathy.  Skin:    General: Skin is warm and dry.     Findings: No rash.  Neurological:     General: No focal deficit present.     Mental Status: She is alert.  Psychiatric:        Mood and Affect: Mood normal.         Behavior: Behavior normal.     LABORATORY DATA:  CBC    Component Value Date/Time   WBC 3.2 (L) 08/13/2023 1111   WBC 6.5 05/03/2023 0522   RBC 4.27 08/13/2023 1111   HGB 11.9 (L) 08/13/2023 1111   HGB 13.2 09/22/2019 1040   HCT 35.8 (L) 08/13/2023 1111   HCT 41.3 09/22/2019 1040   PLT 127 (L) 08/13/2023 1111   PLT 247 09/22/2019 1040   MCV 83.8 08/13/2023 1111   MCV 83 09/22/2019 1040   MCH 27.9 08/13/2023 1111   MCHC 33.2 08/13/2023 1111   RDW 16.0 (H) 08/13/2023 1111   RDW 14.7 09/22/2019 1040   LYMPHSABS 0.9 08/13/2023 1111   MONOABS 0.5 08/13/2023 1111   EOSABS 0.2 08/13/2023 1111   BASOSABS 0.0 08/13/2023 1111    CMP     Component Value Date/Time   NA 143 08/13/2023 1111   NA 142 09/22/2019 1040   K 3.2 (L) 08/13/2023 1111   CL 109 08/13/2023 1111   CO2 30 08/13/2023 1111   GLUCOSE 96 08/13/2023 1111   BUN 9 08/13/2023 1111   BUN 11 09/22/2019 1040   CREATININE 0.65 08/13/2023 1111   CALCIUM  8.8 (L) 08/13/2023 1111   PROT 6.3 (L) 08/13/2023 1111   PROT 6.7 06/17/2019 1054   ALBUMIN 3.3 (L) 08/13/2023 1111   ALBUMIN 4.5 06/17/2019 1054   AST 44 (H) 08/13/2023 1111   ALT 28 08/13/2023 1111   ALKPHOS 94 08/13/2023 1111   BILITOT 1.7 (H) 08/13/2023 1111   GFRNONAA >60 08/13/2023 1111   GFRAA 77 09/22/2019 1040     ASSESSMENT and THERAPY PLAN:   Malignant neoplasm of lower-outer quadrant of right breast of female, estrogen receptor positive (HCC) 06/02/2020:Right lumpectomy ([facility]): invasive and in situ ductal carcinoma, 1.3cm, clear margins, 1 right axillary lymph node negative for carcinoma. ER 15% weak, PR-,HER-2 positive (3+) Ki67 80%.   Treatment plan: 1.  Adjuvant chemotherapy with Taxol  Herceptin  followed by Herceptin  maintenance.  Stopped after 11 cycles of Taxol  Herceptin  maintenance completed 07/07/2021  2.  Adjuvant radiation therapy 11/23/2020-12/19/2020 3.  Adjuvant antiestrogen therapy with tamoxifen  started October 2022 4.  CT angiogram  07/13/2021: Right supraclavicular lymph node 5 cm, right internal mammary lymph nodes 1.8 cm 5.  Metastatic breast cancer: June 2023:Right supraclavicular lymphadenopathy: Biopsy: Metastatic poorly differentiated adenocarcinoma, ER +40%, PR negative, HER2 positive 3+ PET CT scan 08/06/21: Ext Right Supra clav LN along with Internal mammary and Rt Upper Mediastinal and Rt Axillary LN    CT chest abdomen pelvis 06/25/2022: Stable exam.  No new or progressive findings. CT CAP 10/04/2022: Stable findings without new or progressive disease CT CAP 02/22/2023: Stable findings without new or progressive disease.  Pneumonia that was noted on chest x-ray from earlier in the month was again noted. CT CAP 06/10/2023: Small supraclavicular lymph node on the left 7 mm, right axillary nodular thickening 1.5 cm overall stable disease, and remained in lung infiltrates. Echocardiogram every 4 months based upon Dr. Andria Keeler recommendation. -----------------------------------------------------------------------------------------------------  Current treatment: Enhertu  started on 08/21/2021 now every 4 weeks Enhertu  toxicities:   Hospitalization 04/29/2023-05/04/2023: Community-acquired pneumonia Chemo-induced peripheral neuropathy  Assessment and Plan Assessment & Plan Metastatic Breast cancer No signs of progression today.  Continuing on Enhertu . - Proceed with treatment today without dose reductions or modifications so long as pending CMET is within parameters.   Hypertension Managed with valsartan  and spironolactone . Lasix  discussed for intermittent use with caution for hypokalemia. - Continue valsartan  and spironolactone . - Use Lasix  as needed for weight gain or swelling, not more  than a couple of days consecutively.  Edema Edema in hands and ankles, possibly due to prolonged sitting. Dietary sodium reduction and leg elevation advised. - Limit sodium intake to less than 2000 mg per day. - Discussed foods low in  sodium   Follow-up Scheduled with Dr. Lorayne Rocks for echocardiogram and consultation. - Attend echocardiogram and follow-up appointment with Dr. Lorayne Rocks on August 26, 2023. - Follow-up in 4 weeks for labs, evaluation and next treatment.     All questions were answered. The patient knows to call the clinic with any problems, questions or concerns. We can certainly see the patient much sooner if necessary.  Total encounter time:20 minutes*in face-to-face visit time, chart review, lab review, care coordination, order entry, and documentation of the encounter time.    Alwin Baars, NP 08/13/23 1:12 PM Medical Oncology and Hematology [facility] [address] [place], [place] [address] Tel. [phone]    Fax. [phone]  *Total Encounter Time as defined by the Centers for Medicare and Medicaid Services includes, in addition to the face-to-face time of a patient visit (documented in the note above) non-face-to-face time: obtaining and reviewing outside history, ordering and reviewing medications, tests or procedures, care coordination (communications with other health care professionals or caregivers) and documentation in the medical record.

## 2023-08-13 NOTE — Patient Instructions (Addendum)
 [facility] - A DEPT OF [facility]  Discharge Instructions:  Per Loris Ros: Take 20 mEq of Potassium twice a day for 3 days. Also on days that that you take Furosemide  (Lasix ) take Potassium 20mEq twice a day. Thank you for choosing [facility] to provide your oncology and hematology care.   If you have a lab appointment with the Cancer Center, please go directly to the Cancer Center and check in at the registration area.   Wear comfortable clothing and clothing appropriate for easy access to any Portacath or PICC line.   We strive to give you quality time with your provider. You may need to reschedule your appointment if you arrive late (15 or more minutes).  Arriving late affects you and other patients whose appointments are after yours.  Also, if you miss three or more appointments without notifying the office, you may be dismissed from the clinic at the provider's discretion.      For prescription refill requests, have your pharmacy contact our office and allow 72 hours for refills to be completed.    Today you received the following chemotherapy and/or immunotherapy agents: Enhertu       To help prevent nausea and vomiting after your treatment, we encourage you to take your nausea medication as directed.  BELOW ARE SYMPTOMS THAT SHOULD BE REPORTED IMMEDIATELY: *FEVER GREATER THAN 100.4 F (38 C) OR HIGHER *CHILLS OR SWEATING *NAUSEA AND VOMITING THAT IS NOT CONTROLLED WITH YOUR NAUSEA MEDICATION *UNUSUAL SHORTNESS OF BREATH *UNUSUAL BRUISING OR BLEEDING *URINARY PROBLEMS (pain or burning when urinating, or frequent urination) *BOWEL PROBLEMS (unusual diarrhea, constipation, pain near the anus) TENDERNESS IN MOUTH AND THROAT WITH OR WITHOUT PRESENCE OF ULCERS (sore throat, sores in mouth, or a toothache) UNUSUAL RASH, SWELLING OR PAIN  UNUSUAL VAGINAL DISCHARGE OR ITCHING   Items with * indicate a potential emergency and should be followed  up as soon as possible or go to the Emergency Department if any problems should occur.  Please show the CHEMOTHERAPY ALERT CARD or IMMUNOTHERAPY ALERT CARD at check-in to the Emergency Department and triage nurse.  Should you have questions after your visit or need to cancel or reschedule your appointment, please contact [facility] - A DEPT OF [facility]  Dept: [phone]  and follow the prompts.  Office hours are 8:00 a.m. to 4:30 p.m. Monday - Friday. Please note that voicemails left after 4:00 p.m. may not be returned until the following business day.  We are closed weekends and major holidays. You have access to a nurse at all times for urgent questions. Please call the main number to the clinic Dept: [phone] and follow the prompts.   For any non-urgent questions, you may also contact your provider using MyChart. We now offer e-Visits for anyone 4 and older to request care online for non-urgent symptoms. For details visit [email or web address].   Also download the MyChart app! Go to the app store, search "MyChart", open the app, select [place], and log in with your MyChart username and password.

## 2023-08-14 ENCOUNTER — Other Ambulatory Visit: Payer: Self-pay | Admitting: Hematology and Oncology

## 2023-08-14 DIAGNOSIS — Z170 Estrogen receptor positive status [ER+]: Secondary | ICD-10-CM

## 2023-08-24 ENCOUNTER — Encounter (HOSPITAL_COMMUNITY): Payer: Self-pay | Admitting: *Deleted

## 2023-08-24 ENCOUNTER — Encounter: Payer: Self-pay | Admitting: Hematology and Oncology

## 2023-08-24 ENCOUNTER — Ambulatory Visit (HOSPITAL_COMMUNITY)
Admission: EM | Admit: 2023-08-24 | Discharge: 2023-08-24 | Disposition: A | Discharge status: Home / Self Care | Payer: Worker's Compensation | Attending: Emergency Medicine | Admitting: Emergency Medicine

## 2023-08-24 ENCOUNTER — Other Ambulatory Visit: Payer: Self-pay

## 2023-08-24 DIAGNOSIS — W19XXXA Unspecified fall, initial encounter: Secondary | ICD-10-CM

## 2023-08-24 DIAGNOSIS — M25562 Pain in left knee: Secondary | ICD-10-CM | POA: Diagnosis not present

## 2023-08-24 MED ORDER — ACETAMINOPHEN 500 MG PO TABS: 500.0000 mg | ORAL_TABLET | Freq: Four times a day (QID) | ORAL | 0 refills | Status: AC | PRN

## 2023-08-24 NOTE — Discharge Instructions (Signed)
 We have provided you with a knee sleeve today.  Wear this to help compress the area and stabilize the joint to help with your pain. You can take 500 mg Tylenol  every 6 hours as needed for pain. Alternate between ice and heat as needed for pain Rest and elevate your leg as often as possible. Follow-up with [facility] for further evaluation and management of your pain. Return here as needed.

## 2023-08-24 NOTE — ED Triage Notes (Signed)
 PT slipped in water at work in bathroom. the patient twisted Lt leg and heard a pop. Pt had sharp pain. Pt reports swelling to Lt knee. Pt has pain behind knee.

## 2023-08-24 NOTE — ED Provider Notes (Signed)
 [facility]    CSN: [id number] Arrival date & time: 08/24/23  1358      History   Chief Complaint Chief Complaint  Patient presents with   Fall    HPI Pamela [name] is a 58 y.o. female.   Patient presents with posterior left knee pain and swelling since a fall that occurred at work on 6/17.  Patient states that while she was at work she slipped on water and the bathroom and twisted her left leg and heard a pop and had a sharp pain to the posterior aspect of her left knee.  Patient is able to ambulate, but does so with a slight limp.  Patient denies taking anything for pain.  Patient denies any other injuries from this fall.  Patient denies hitting her head or loss of consciousness.  Patient does not take any blood thinners.  Of note patient is currently on chemotherapy regimen for breast cancer.  The history is provided by the patient and medical records.  Fall    Past Medical History:  Diagnosis Date   Anxiety    Asthma    Back pain    Breast cancer (HCC)    Constipation    DUB (dysfunctional uterine bleeding) 2007   Edema of both lower extremities    Elevated cholesterol    Family history of breast cancer 05/12/2020   Family history of lung cancer 05/12/2020   Food allergy    Grade I diastolic dysfunction 04/30/2023   H/O menorrhagia 05/01/2006   High cholesterol    History of ovarian cyst 2007   Hypertension 03/31/2005   Increased BMI 07/11/2006   Joint pain    Migraine    N&V (nausea and vomiting)    Obesity 2007   Obstructive sleep apnea syndrome, moderate 10/27/2013   no cpap   Personal history of chemotherapy    Personal history of radiation therapy    Sleep apnea    SOB (shortness of breath)    Vitamin D  deficiency    Vitamin D  deficiency    Weight loss 07/11/2006    Patient Active Problem List   Diagnosis Date Noted   Adjustment disorder with mixed anxiety and depressed mood 07/27/2023   Chemotherapy induced nausea and vomiting  07/22/2023   Encounter for annual health examination 07/22/2023   Hypertensive heart disease without heart failure 05/09/2023   Transaminitis 05/09/2023   Hypophosphatemia 04/30/2023   Thrombocytopenia (HCC) 04/30/2023   Elevated troponin 04/30/2023   Normocytic anemia 04/30/2023   Grade I diastolic dysfunction 04/30/2023   Prolonged QT interval 04/30/2023   Drug-induced constipation 11/24/2022   Chemotherapy induced cardiomyopathy (HCC) 05/08/2022   Aortic atherosclerosis (HCC) 04/26/2022   Hypomagnesemia 11/26/2021   Hypokalemia 11/26/2021   Dizziness, fatigue, globus sensation 11/26/2021   Breast cancer (HCC) 08/25/2021   Nausea without vomiting 08/25/2021   Prediabetes 03/13/2021   Non-intractable vomiting 07/13/2020   Port-A-Cath in place 07/08/2020   Genetic testing 05/20/2020   Family history of breast cancer 05/12/2020   Family history of lung cancer 05/12/2020   Malignant neoplasm of lower-outer quadrant of right breast of female, estrogen receptor positive (HCC) 05/03/2020   Elevated troponin level not due myocardial infarction 09/18/2019   Mixed hyperlipidemia 09/18/2019   Hypertensive emergency 09/17/2019   Primary osteoarthritis of right hip 08/06/2018   Seasonal allergies 06/12/2018   Fibrocystic disease of breast 05/27/2018   Obstructive sleep apnea syndrome, moderate 10/27/2013   Shift work sleep disorder 10/27/2013   Psychic factors  associated with diseases classified elsewhere 08/07/2013   Pure hypercholesterolemia 07/21/2013   Essential hypertension 07/21/2013   Snoring 07/21/2013   IUD contraception-inserted 10/01/11 10/01/2011   Class 3 severe obesity due to excess calories with serious comorbidity and body mass index (BMI) of 50.0 to 59.9 in adult 09/03/2011    Past Surgical History:  Procedure Laterality Date   BREAST LUMPECTOMY     BREAST LUMPECTOMY WITH RADIOACTIVE SEED AND SENTINEL LYMPH NODE BIOPSY Right 06/02/2020   Procedure: RIGHT BREAST  LUMPECTOMY WITH RADIOACTIVE SEED AND RIGHT SENTINEL LYMPH NODE BIOPSY;  Surgeon: Vanderbilt Ned, MD;  Location: [facility];  Service: General;  Laterality: Right;   BREAST REDUCTION SURGERY  05/04/1991   CESAREAN SECTION     HYSTEROSCOPY  05/01/2006   PORTACATH PLACEMENT Right 06/02/2020   Procedure: INSERTION PORT-A-CATH;  Surgeon: Vanderbilt Ned, MD;  Location: [facility];  Service: General;  Laterality: Right;   REDUCTION MAMMAPLASTY      OB History     Gravida  4   Para  2   Term  1   Preterm  1   AB  2   Living  2      SAB  1   IAB      Ectopic      Multiple      Live Births  2            Home Medications    Prior to Admission medications   Medication Sig Start Date End Date Taking? Authorizing Provider  acetaminophen  (TYLENOL ) 500 MG tablet Take 1 tablet (500 mg total) by mouth every 6 (six) hours as needed. 08/24/23  Yes Johnie, Jearldean Gutt A, NP  albuterol  (VENTOLIN  HFA) 108 (90 Base) MCG/ACT inhaler Inhale 2 puffs into the lungs every 6 (six) hours as needed for wheezing or shortness of breath. 03/07/23  Yes Causey, Morna Pickle, NP  aspirin  EC 81 MG tablet Take 1 tablet (81 mg total) by mouth daily. Swallow whole. 09/18/19  Yes Elgergawy, Brayton RAMAN, MD  escitalopram (LEXAPRO) 10 MG tablet Take 10 mg by mouth daily. Take one tablet by mouth once daily.   Yes Long, Rockey GRADE, MD  levonorgestrel  (MIRENA , 52 MG,) 20 MCG/DAY IUD 1 each by Intrauterine route once.   Yes [provider]  lidocaine -prilocaine  (EMLA ) cream Apply to affected area once AS DIRECTED 08/14/23  Yes Odean Potts, MD  LORazepam  (ATIVAN ) 0.5 MG tablet Take 0.5 mg by mouth daily. Take one tablet by mouth once daily as needed.   Yes Long, Rockey GRADE, MD  potassium chloride  (MICRO-K ) 10 MEQ CR capsule Take 40 mEq by mouth daily. 12/03/22  Yes [provider]  rosuvastatin  (CRESTOR ) 40 MG tablet Take 1 tablet (40 mg total) by mouth daily. 10/16/22  Yes  Bensimhon, Toribio SAUNDERS, MD  Spacer/Aero-Holding Chambers (AEROCHAMBER PLUS) Device Please dispense spacer for inhaler use 03/07/23  Yes Causey, Morna Pickle, NP  spironolactone  (ALDACTONE ) 25 MG tablet Take 1 tablet (25 mg total) by mouth daily. 04/11/23  Yes Bensimhon, Toribio SAUNDERS, MD  valsartan  (DIOVAN ) 160 MG tablet Take 1 tablet (160 mg total) by mouth daily. 01/08/23 01/08/24 Yes [name], MD  furosemide  (LASIX ) 20 MG tablet Take 1 tablet (20 mg total) by mouth as needed. TAKE 1 TABLET AS NEEDED FOR SWELLING OR SHORTNESS OF BREATH OR WEIGHT GAIN. 3 POUNDS WITHIN 24HR OR 5 POUNDS 7 DAYS Patient not taking: Reported on 06/12/2023 08/21/22 04/10/24  Bensimhon, Toribio SAUNDERS, MD  venlafaxine  XR (EFFEXOR -XR) 37.5 MG 24 hr capsule Take 37.5 mg by mouth every morning. 12/03/22   [provider]    Family History Family History  Problem Relation Age of Onset   Hypertension Mother    High Cholesterol Mother    Thyroid  disease Mother    Cancer Mother    Sleep apnea Mother    Hypertension Father    Heart attack Father    Sudden death Father    Diabetes Maternal Grandmother    Bone cancer Maternal Grandfather        dx after 44   Breast cancer Maternal Aunt        dx 30s   Lung cancer Maternal Aunt        dx after 87    Social History Social History   Tobacco Use   Smoking status: Never   Smokeless tobacco: Never  Vaping Use   Vaping status: Never Used  Substance Use Topics   Alcohol use: No   Drug use: Not Currently    Frequency: 2.0 times per week     Allergies   Pollen extract, Compazine  [prochlorperazine ], Latex, Codeine, Diflucan  [fluconazole ], Shellfish allergy, and Betadine [povidone iodine]   Review of Systems Review of Systems  Per HPI  Physical Exam Triage Vital Signs ED Triage Vitals  Encounter Vitals Group     BP 08/24/23 1439 (!) 169/102     Girls Systolic BP Percentile --      Girls Diastolic BP Percentile --      Boys Systolic BP Percentile --       Boys Diastolic BP Percentile --      Pulse Rate 08/24/23 1439 67     Resp 08/24/23 1439 20     Temp 08/24/23 1439 98.6 F (37 C)     Temp src --      SpO2 08/24/23 1439 97 %     Weight --      Height --      Head Circumference --      Peak Flow --      Pain Score 08/24/23 1432 7     Pain Loc --      Pain Education --      Exclude from Growth Chart --    No data found.  Updated Vital Signs BP (!) 169/102 (BP Location: Left Wrist)   Pulse 67   Temp 98.6 F (37 C)   Resp 20   LMP  (LMP Unknown)   SpO2 97%   Visual Acuity Right Eye Distance:   Left Eye Distance:   Bilateral Distance:    Right Eye Near:   Left Eye Near:    Bilateral Near:     Physical Exam Vitals and nursing note reviewed.  Constitutional:      General: She is awake. She is not in acute distress.    Appearance: Normal appearance. She is well-developed and well-groomed. She is not ill-appearing.   Musculoskeletal:     Left knee: No swelling, deformity, effusion or erythema. Normal range of motion. Tenderness present over the PCL.   Skin:    General: Skin is warm and dry.   Neurological:     Mental Status: She is alert.   Psychiatric:        Behavior: Behavior is cooperative.      UC Treatments / Results  Labs (all labs ordered are listed, but only abnormal results are displayed) Labs Reviewed - No data to display  EKG  Radiology No results found.  Procedures Procedures (including critical care time)  Medications Ordered in UC Medications - No data to display  Initial Impression / Assessment and Plan / UC Course  I have reviewed the triage vital signs and the nursing notes.  Pertinent labs & imaging results that were available during my care of the patient were reviewed by me and considered in my medical decision making (see chart for details).     Patient is well-appearing.  Vital stable.  Upon assessment there is tenderness noted over the PCL.  No obvious swelling,  deformity, or effusion noted.  Without decreased range of motion.  Deferred imaging at this time due to pain likely being inconsistent with bony abnormality.  Prescribed Tylenol  as needed for pain.  Given knee sleeve.  Given orthopedic follow-up.  Discussed follow-up and return precautions. Final Clinical Impressions(s) / UC Diagnoses   Final diagnoses:  Fall, initial encounter  Posterior left knee pain     Discharge Instructions      We have provided you with a knee sleeve today.  Wear this to help compress the area and stabilize the joint to help with your pain. You can take 500 mg Tylenol  every 6 hours as needed for pain. Alternate between ice and heat as needed for pain Rest and elevate your leg as often as possible. Follow-up with [facility] for further evaluation and management of your pain. Return here as needed.    ED Prescriptions     Medication Sig Dispense Auth. Provider   acetaminophen  (TYLENOL ) 500 MG tablet Take 1 tablet (500 mg total) by mouth every 6 (six) hours as needed. 30 tablet Johnie Flaming A, NP      PDMP not reviewed this encounter.   Johnie Flaming A, NP 08/24/23 1459

## 2023-08-25 NOTE — Progress Notes (Signed)
 CARDIO-ONCOLOGY CLINIC NOTE  Referring Physician: Dr. Odean Primary Care: [name], MD Primary Cardiologist: Dr. Court  HPI:  Pamela [name] is 58 y.o. female with obesity, HTN, HL and right breast cancer  referred by Dr. Odean  for enrollment into the Cardio-Oncology program.   Malignant neoplasm of lower-outer quadrant of right breast of female, estrogen receptor positive (HCC)  05/03/2020 Initial Diagnosis    Screening mammogram showed a 1.0cm mass at the 6 o'clock position in the right breast. Diagnostic mammogram and US  showed the 1.2cm mass at the 6 o'clock position in the right breast. Biopsy showed IDC, grade 3, HER-2 positive (3+), ER 15% weak, PR-, Ki67 80%.    05/11/2020 Cancer Staging    Staging form: Breast, AJCC 8th Edition - Clinical stage from 05/11/2020: Stage IA (cT1b, cN0, cM0, G3, ER+, PR-, HER2+) - Signed by Odean Potts, MD on 05/11/2020 Stage prefix: Initial diagnosis    05/19/2020 Genetic Testing    Negative hereditary cancer genetic testing: no pathogenic variants detected in [facility] Breast STAT Panel and Common Hereditary Cancers Panel.  The report dates are May 19, 2020 (STAT) and May 23, 2020 (Common Hereditary).    The Common Hereditary Cancers Panel offered by [facility] includes sequencing and/or deletion duplication testing of the following 47 genes: APC, ATM, AXIN2, BARD1, BMPR1A, BRCA1, BRCA2, BRIP1, CDH1, CDK4, CDKN2A (p14ARF), CDKN2A (p16INK4a), CHEK2, CTNNA1, DICER1, EPCAM (Deletion/duplication testing only), GREM1 (promoter region deletion/duplication testing only), GREM1, HOXB13, KIT, MEN1, MLH1, MSH2, MSH3, MSH6, MUTYH, NBN, NF1, NHTL1, PALB2, PDGFRA, PMS2, POLD1, POLE, PTEN, RAD50, RAD51C, RAD51D, SDHA, SDHB, SDHC, SDHD, SMAD4, SMARCA4. STK11, TP53, TSC1, TSC2, and VHL.  The following genes were evaluated for sequence changes only: SDHA and HOXB13 c.251G>A variant only.es only: SDHA and HOXB13 c.251G>A variant only.    06/02/2020 Surgery    Right  lumpectomy ([facility]): invasive and in situ ductal carcinoma, 1.3cm, clear margins, 1 right axillary lymph node negative for carcinoma.    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 Denies known heart disease.  Diagnosed with HER-2+ breast CA in 3/22. Treated with lumpectomy in 3/22. Then Taxol /Herceptin  12 rounds from 5/22-8/22. Now on Herceptin  q 3 weeks. Finished XRT in 10/22.   Finished Herceptin  07/08/21. Found to have recurrent disease in lymph node in her neck. Enhertu  started on 08/21/21  Here for routine f/u with echo. Remains on Ehhertu every 4 weeks. Tolerating well. Had PNA in November 24 and more recently had the flu. Now feeling better. Feels ok. No edema or undue SOB. BP has been running high   Echo 04/11/23 EF 60-65% GLS in 2-chamber (only accurate) -18.5%    At last visit I added spiro for HTN/hypokalemia   Echo 08/21/22 EF 60-65%  Echo 03/28/21: EF 55-60% mod LVVH G1DD GLS -18.0  Echo 09/21/21 EF 60-65% Mild to mod LVH G1DD GLS -15.9% (underestimated)  Echo 4/23 EF 55% GLS -19.9% Echo1/17/22 Echo 55-60%  Mod LVH G2DD GLS 15.0% Echo 05/20/20: EF 60-65% GLS -21.2% Echo 08/30/20:  EF 60-65% GLS - 20.7% Echo 10/22: EF 60-65% GLS reported as -13.3%    Past Medical History:  Diagnosis Date   Anxiety    Asthma    Back pain    Breast cancer (HCC)    Constipation    DUB (dysfunctional uterine bleeding) 2007   Edema of both lower extremities    Elevated cholesterol    Family history of breast cancer 05/12/2020   Family history of lung cancer  05/12/2020   Food allergy    Grade I diastolic dysfunction 04/30/2023   H/O menorrhagia 05/01/2006   High cholesterol    History of ovarian cyst 2007   Hypertension 03/31/2005   Increased BMI 07/11/2006   Joint pain    Migraine    N&V (nausea and vomiting)    Obesity 2007   Obstructive sleep apnea syndrome, moderate 10/27/2013   no cpap   Personal history of chemotherapy    Personal history of radiation therapy    Sleep apnea    SOB (shortness of breath)    Vitamin D  deficiency    Vitamin D  deficiency    Weight loss 07/11/2006    Current Outpatient Medications  Medication Sig Dispense Refill   acetaminophen  (TYLENOL ) 500 MG tablet Take 1 tablet (500 mg total) by mouth every 6 (six) hours as needed. 30 tablet 0   albuterol  (VENTOLIN  HFA) 108 (90 Base) MCG/ACT inhaler Inhale 2 puffs into the lungs every 6 (six) hours as needed for wheezing or shortness of breath. 8 g 2   aspirin  EC 81 MG tablet Take 1 tablet (81 mg total) by mouth daily. Swallow whole. 30 tablet 11   escitalopram (LEXAPRO) 10 MG tablet Take 10 mg by mouth daily. Take one tablet by mouth once daily.     furosemide  (LASIX ) 20 MG tablet Take 1 tablet (20 mg total) by mouth as needed. TAKE 1 TABLET AS NEEDED FOR SWELLING OR SHORTNESS OF BREATH OR WEIGHT GAIN. 3 POUNDS WITHIN 24HR OR 5 POUNDS 7 DAYS (Patient not taking: Reported on 06/12/2023) 90 tablet 3   levonorgestrel  (MIRENA , 52 MG,) 20 MCG/DAY IUD 1 each by Intrauterine route once.     lidocaine -prilocaine  (EMLA ) cream Apply to affected area once AS DIRECTED 30 g 3   LORazepam  (ATIVAN ) 0.5 MG tablet Take 0.5 mg by mouth daily. Take one tablet by mouth once daily as needed.     potassium chloride  (MICRO-K ) 10 MEQ CR capsule Take 40 mEq by mouth daily.     rosuvastatin  (CRESTOR ) 40 MG tablet Take 1 tablet (40 mg total) by mouth daily. 90 tablet 3   Spacer/Aero-Holding Chambers (AEROCHAMBER PLUS) Device Please dispense spacer for inhaler use 1 each 0   spironolactone   (ALDACTONE ) 25 MG tablet Take 1 tablet (25 mg total) by mouth daily. 30 tablet 6   valsartan  (DIOVAN ) 160 MG tablet Take 1 tablet (160 mg total) by mouth daily. 30 tablet 11   venlafaxine  XR (EFFEXOR -XR) 37.5 MG 24 hr capsule Take 37.5 mg by mouth every morning.     No current facility-administered medications for this encounter.   Facility-Administered Medications Ordered in Other Encounters  Medication Dose Route Frequency Provider Last Rate Last Admin   heparin  lock flush 100 unit/mL  500 Units Intracatheter Once Odean Potts, MD       sodium chloride  flush (NS) 0.9 % injection 10 mL  10 mL Intracatheter Once Gudena, Vinay, MD        Allergies  Allergen Reactions   Pollen Extract  Shortness Of Breath   Compazine  [Prochlorperazine ] Anxiety    IV compazine  causes severe anxiety attack   Latex Rash   Codeine Hives   Diflucan  [Fluconazole ] Hives   Shellfish Allergy Hives   Betadine [Povidone Iodine] Rash      Social History   Socioeconomic History   Marital status: Single    Spouse name: Not on file   Number of children: Not on file   Years of education: Not on file   Highest education level: Not on file  Occupational History   Occupation: Child psychotherapist  Tobacco Use   Smoking status: Never   Smokeless tobacco: Never  Vaping Use   Vaping status: Never Used  Substance and Sexual Activity   Alcohol use: No   Drug use: Not Currently    Frequency: 2.0 times per week   Sexual activity: Not Currently    Birth control/protection: I.U.D.    Comment: Mirena   Other Topics Concern   Not on file  Social History Narrative   Not on file   Social Drivers of Health   Financial Resource Strain: High Risk (03/14/2022)   Overall Financial Resource Strain (CARDIA)    Difficulty of Paying Living Expenses: Hard  Food Insecurity: No Food Insecurity (04/30/2023)   Hunger Vital Sign    Worried About Running Out of Food in the Last Year: Never true    Ran Out of Food in the Last Year:  Never true  Transportation Needs: No Transportation Needs (04/30/2023)   PRAPARE - Administrator, Civil Service (Medical): No    Lack of Transportation (Non-Medical): No  Physical Activity: Not on file  Stress: Not on file  Social Connections: Moderately Isolated (04/30/2023)   Social Connection and Isolation Panel    Frequency of Communication with Friends and Family: More than three times a week    Frequency of Social Gatherings with Friends and Family: More than three times a week    Attends Religious Services: More than 4 times per year    Active Member of [employer] or Organizations: No    Attends Banker Meetings: Not on file    Marital Status: Separated  Intimate Partner Violence: Not At Risk (04/30/2023)   Humiliation, Afraid, Rape, and Kick questionnaire    Fear of Current or Ex-Partner: No    Emotionally Abused: No    Physically Abused: No    Sexually Abused: No      Family History  Problem Relation Age of Onset   Hypertension Mother    High Cholesterol Mother    Thyroid  disease Mother    Cancer Mother    Sleep apnea Mother    Hypertension Father    Heart attack Father    Sudden death Father    Diabetes Maternal Grandmother    Bone cancer Maternal Grandfather        dx after 50   Breast cancer Maternal Aunt        dx 30s   Lung cancer Maternal Aunt        dx after 50    There were no vitals filed for this visit.     Wt Readings from Last 3 Encounters:  08/13/23 (!) 145.5 kg (320 lb 11.2 oz)  07/22/23 (!) 145.6 kg (321 lb)  07/12/23 (!) 146.3 kg (322 lb 8 oz)    PHYSICAL EXAM: General:  Obese woman No resp difficulty HEENT: normal Neck: supple. no JVD. Carotids 2+ bilat; no bruits. No lymphadenopathy or thryomegaly  appreciated. Cor: PMI nondisplaced. Regular rate & rhythm. No rubs, gallops or murmurs. Lungs: clear Abdomen: obese  soft, nontender, nondistended.Good bowel sounds. Extremities: no cyanosis, clubbing, rash,  edema Neuro: alert & orientedx3, cranial nerves grossly intact. moves all 4 extremities w/o difficulty. Affect pleasant  ASSESSMENT & PLAN:  1. Right Breast Cancer, stage IV - HER2 + - Treated with lumpectomy in 3/22. Then Taxol /Herceptin  12 rounds from 5/22-8/22. Now on Herceptin  q 3 weeks. Also about to finish XRT.  - Echo 10/22 EF 60-65% GLS reported as -13.3%. I have reviewed this and reading is inaccurate due to poor endocardial tracking - Finished Herceptin  07/08/21. Found to have recurrent disease in lymph node in her neck. Enhertu  started on 08/21/21.  - Echo 04/11/23 EF 60-65% GLS in 2-chamber (only accurate) -18.5%   - Given stability with change echos to every 4 months while on EnHertu   2. HTN with LVH on echo  - Blood pressure high  - Home sleep study ordered (and she has device at home) but has not used it - can use lasix  20 prn for edema  3. Snoring/daytime somnolence - pending sleep study. Has WatchPat at home but has not tested  4. Morbid obesity - has enrolled in weight loss clinic - There is no height or weight on file to calculate BMI. - Insurance would not pay for GLP1RA  5. Hypokalemia - K remains low despite supp  - will add spiro   Pamela Blankenbeckler, MD  4:52 PM

## 2023-08-26 ENCOUNTER — Ambulatory Visit (HOSPITAL_COMMUNITY)
Admission: RE | Admit: 2023-08-26 | Discharge: 2023-08-26 | Disposition: A | Discharge status: Home / Self Care | Source: Ambulatory Visit | Attending: Internal Medicine | Admitting: Internal Medicine

## 2023-08-26 ENCOUNTER — Encounter (HOSPITAL_COMMUNITY): Payer: Self-pay | Admitting: Internal Medicine

## 2023-08-26 ENCOUNTER — Ambulatory Visit (HOSPITAL_BASED_OUTPATIENT_CLINIC_OR_DEPARTMENT_OTHER)
Admission: RE | Admit: 2023-08-26 | Discharge: 2023-08-26 | Disposition: A | Discharge status: Home / Self Care | Source: Ambulatory Visit | Attending: Internal Medicine | Admitting: Internal Medicine

## 2023-08-26 VITALS — BP 150/88 | HR 62 | Wt 323.2 lb

## 2023-08-26 DIAGNOSIS — C50511 Malignant neoplasm of lower-outer quadrant of right female breast: Secondary | ICD-10-CM | POA: Diagnosis not present

## 2023-08-26 DIAGNOSIS — E876 Hypokalemia: Secondary | ICD-10-CM | POA: Insufficient documentation

## 2023-08-26 DIAGNOSIS — R0683 Snoring: Secondary | ICD-10-CM | POA: Diagnosis not present

## 2023-08-26 DIAGNOSIS — T451X5A Adverse effect of antineoplastic and immunosuppressive drugs, initial encounter: Secondary | ICD-10-CM | POA: Insufficient documentation

## 2023-08-26 DIAGNOSIS — R7303 Prediabetes: Secondary | ICD-10-CM | POA: Insufficient documentation

## 2023-08-26 DIAGNOSIS — I10 Essential (primary) hypertension: Secondary | ICD-10-CM

## 2023-08-26 DIAGNOSIS — Z1731 Human epidermal growth factor receptor 2 positive status: Secondary | ICD-10-CM | POA: Diagnosis not present

## 2023-08-26 DIAGNOSIS — Z6841 Body Mass Index (BMI) 40.0 and over, adult: Secondary | ICD-10-CM | POA: Diagnosis not present

## 2023-08-26 DIAGNOSIS — Z170 Estrogen receptor positive status [ER+]: Secondary | ICD-10-CM | POA: Diagnosis not present

## 2023-08-26 DIAGNOSIS — I119 Hypertensive heart disease without heart failure: Secondary | ICD-10-CM | POA: Diagnosis not present

## 2023-08-26 DIAGNOSIS — C50919 Malignant neoplasm of unspecified site of unspecified female breast: Secondary | ICD-10-CM

## 2023-08-26 DIAGNOSIS — Z79899 Other long term (current) drug therapy: Secondary | ICD-10-CM | POA: Diagnosis not present

## 2023-08-26 DIAGNOSIS — G4733 Obstructive sleep apnea (adult) (pediatric): Secondary | ICD-10-CM | POA: Insufficient documentation

## 2023-08-26 DIAGNOSIS — I427 Cardiomyopathy due to drug and external agent: Secondary | ICD-10-CM

## 2023-08-26 DIAGNOSIS — E785 Hyperlipidemia, unspecified: Secondary | ICD-10-CM | POA: Diagnosis not present

## 2023-08-26 DIAGNOSIS — R600 Localized edema: Secondary | ICD-10-CM | POA: Diagnosis present

## 2023-08-26 DIAGNOSIS — C50811 Malignant neoplasm of overlapping sites of right female breast: Secondary | ICD-10-CM | POA: Diagnosis not present

## 2023-08-26 LAB — ECHOCARDIOGRAM COMPLETE
Area-P 1/2: 3.21 cm2
S' Lateral: 3.5 cm

## 2023-08-26 NOTE — Patient Instructions (Signed)
 Great to see you today!!!  Medication Changes:  RESTART Spironolactone   Testing/Procedures:  Your physician has requested that you have an echocardiogram. Echocardiography is a painless test that uses sound waves to create images of your heart. It provides your doctor with information about the size and shape of your heart and how well your heart's chambers and valves are working. This procedure takes approximately one hour. There are no restrictions for this procedure. Please do NOT wear cologne, perfume, aftershave, or lotions (deodorant is allowed). Please arrive 15 minutes prior to your appointment time.  Please note: We ask at that you not bring children with you during ultrasound (echo/ vascular) testing. Due to room size and safety concerns, children are not allowed in the ultrasound rooms during exams. Our front office staff cannot provide observation of children in our lobby area while testing is being conducted. An adult accompanying a patient to their appointment will only be allowed in the ultrasound room at the discretion of the ultrasound technician under special circumstances. We apologize for any inconvenience.  Special Instructions // Education:  Do the following things EVERYDAY: Weigh yourself in the morning before breakfast. Write it down and keep it in a log. Take your medicines as prescribed Eat low salt foods--Limit salt (sodium) to 2000 mg per day.  Stay as active as you can everyday Limit all fluids for the day to less than 2 liters   Follow-Up in: 4 months with an echo (October), *PLEASE CALL OUR OFFICE IN AUGUST TO SCHEDULE THIS APPOINTMENT   At the [facility], you and your health needs are our priority. We have a designated team specialized in the treatment of Heart Failure. This Care Team includes your primary Heart Failure Specialized Cardiologist (physician), Advanced Practice Providers (APPs- Physician Assistants and Nurse Practitioners),  and Pharmacist who all work together to provide you with the care you need, when you need it.   You may see any of the following providers on your designated Care Team at your next follow up:  Dr. Toribio Fuel Dr. Ezra Shuck Dr. Ria Commander Dr. Odis Brownie Greig Mosses, NP Caffie Shed, [place] [facility] [place], [place] Beckey Coe, NP [place] Lee, NP Tinnie Redman, PharmD   Please be sure to bring in all your medications bottles to every appointment.   Need to Contact Us :  If you have any questions or concerns before your next appointment please send us  a message through [place] or call our office at [phone].    TO LEAVE A MESSAGE FOR THE NURSE SELECT OPTION 2, PLEASE LEAVE A MESSAGE INCLUDING: YOUR NAME DATE OF BIRTH CALL BACK NUMBER REASON FOR CALL**this is important as we prioritize the call backs  YOU WILL RECEIVE A CALL BACK THE SAME DAY AS LONG AS YOU CALL BEFORE 4:00 PM

## 2023-08-26 NOTE — Progress Notes (Signed)
 Echocardiogram 2D Echocardiogram has been performed.  Koleen KANDICE Popper, RDCS 08/26/2023, 9:06 AM

## 2023-09-05 ENCOUNTER — Other Ambulatory Visit: Payer: Self-pay | Admitting: Hematology and Oncology

## 2023-09-05 DIAGNOSIS — C50511 Malignant neoplasm of lower-outer quadrant of right female breast: Secondary | ICD-10-CM

## 2023-09-10 MED FILL — Fosaprepitant Dimeglumine For IV Infusion 150 MG (Base Eq): INTRAVENOUS | Qty: 5 | Status: AC

## 2023-09-11 ENCOUNTER — Inpatient Hospital Stay (HOSPITAL_BASED_OUTPATIENT_CLINIC_OR_DEPARTMENT_OTHER): Admitting: Adult Health

## 2023-09-11 ENCOUNTER — Encounter: Payer: Self-pay | Admitting: Adult Health

## 2023-09-11 ENCOUNTER — Inpatient Hospital Stay

## 2023-09-11 ENCOUNTER — Inpatient Hospital Stay: Attending: Hematology and Oncology

## 2023-09-11 VITALS — BP 158/70 | HR 86 | Temp 98.3°F | Resp 18 | Ht 66.0 in | Wt 319.2 lb

## 2023-09-11 DIAGNOSIS — Z8249 Family history of ischemic heart disease and other diseases of the circulatory system: Secondary | ICD-10-CM | POA: Diagnosis not present

## 2023-09-11 DIAGNOSIS — C50511 Malignant neoplasm of lower-outer quadrant of right female breast: Secondary | ICD-10-CM

## 2023-09-11 DIAGNOSIS — M7989 Other specified soft tissue disorders: Secondary | ICD-10-CM | POA: Diagnosis not present

## 2023-09-11 DIAGNOSIS — N6315 Unspecified lump in the right breast, overlapping quadrants: Secondary | ICD-10-CM | POA: Diagnosis not present

## 2023-09-11 DIAGNOSIS — Z801 Family history of malignant neoplasm of trachea, bronchus and lung: Secondary | ICD-10-CM | POA: Insufficient documentation

## 2023-09-11 DIAGNOSIS — I10 Essential (primary) hypertension: Secondary | ICD-10-CM | POA: Diagnosis not present

## 2023-09-11 DIAGNOSIS — Z170 Estrogen receptor positive status [ER+]: Secondary | ICD-10-CM | POA: Insufficient documentation

## 2023-09-11 DIAGNOSIS — Z83438 Family history of other disorder of lipoprotein metabolism and other lipidemia: Secondary | ICD-10-CM | POA: Insufficient documentation

## 2023-09-11 DIAGNOSIS — I700 Atherosclerosis of aorta: Secondary | ICD-10-CM | POA: Diagnosis not present

## 2023-09-11 DIAGNOSIS — M79603 Pain in arm, unspecified: Secondary | ICD-10-CM | POA: Diagnosis not present

## 2023-09-11 DIAGNOSIS — Z1731 Human epidermal growth factor receptor 2 positive status: Secondary | ICD-10-CM | POA: Insufficient documentation

## 2023-09-11 DIAGNOSIS — Z1722 Progesterone receptor negative status: Secondary | ICD-10-CM | POA: Diagnosis not present

## 2023-09-11 DIAGNOSIS — Z5986 Financial insecurity: Secondary | ICD-10-CM | POA: Diagnosis not present

## 2023-09-11 DIAGNOSIS — Z803 Family history of malignant neoplasm of breast: Secondary | ICD-10-CM | POA: Insufficient documentation

## 2023-09-11 DIAGNOSIS — Z5112 Encounter for antineoplastic immunotherapy: Secondary | ICD-10-CM | POA: Insufficient documentation

## 2023-09-11 DIAGNOSIS — Z833 Family history of diabetes mellitus: Secondary | ICD-10-CM | POA: Insufficient documentation

## 2023-09-11 DIAGNOSIS — Z79899 Other long term (current) drug therapy: Secondary | ICD-10-CM | POA: Insufficient documentation

## 2023-09-11 DIAGNOSIS — C50919 Malignant neoplasm of unspecified site of unspecified female breast: Secondary | ICD-10-CM

## 2023-09-11 DIAGNOSIS — Z8349 Family history of other endocrine, nutritional and metabolic diseases: Secondary | ICD-10-CM | POA: Insufficient documentation

## 2023-09-11 DIAGNOSIS — R110 Nausea: Secondary | ICD-10-CM

## 2023-09-11 DIAGNOSIS — Z809 Family history of malignant neoplasm, unspecified: Secondary | ICD-10-CM | POA: Insufficient documentation

## 2023-09-11 DIAGNOSIS — Z9221 Personal history of antineoplastic chemotherapy: Secondary | ICD-10-CM | POA: Diagnosis not present

## 2023-09-11 DIAGNOSIS — Z95828 Presence of other vascular implants and grafts: Secondary | ICD-10-CM

## 2023-09-11 LAB — CMP (CANCER CENTER ONLY)
ALT: 29 U/L (ref 0–44)
AST: 41 U/L (ref 15–41)
Albumin: 3.5 g/dL (ref 3.5–5.0)
Alkaline Phosphatase: 114 U/L (ref 38–126)
Anion gap: 4 — ABNORMAL LOW (ref 5–15)
BUN: 9 mg/dL (ref 6–20)
CO2: 28 mmol/L (ref 22–32)
Calcium: 9.3 mg/dL (ref 8.9–10.3)
Chloride: 107 mmol/L (ref 98–111)
Creatinine: 0.65 mg/dL (ref 0.44–1.00)
GFR, Estimated: >60 mL/min (ref >60)
Glucose, Bld: 99 mg/dL (ref 70–99)
Potassium: 4.1 mmol/L (ref 3.5–5.1)
Sodium: 139 mmol/L (ref 135–145)
Total Bilirubin: 1.4 mg/dL — ABNORMAL HIGH (ref 0.0–1.2)
Total Protein: 6.5 g/dL (ref 6.5–8.1)

## 2023-09-11 LAB — CBC WITH DIFFERENTIAL (CANCER CENTER ONLY)
Abs Immature Granulocytes: 0.01 K/uL (ref 0.00–0.07)
Basophils Absolute: 0.1 K/uL (ref 0.0–0.1)
Basophils Relative: 1 %
Eosinophils Absolute: 0.3 K/uL (ref 0.0–0.5)
Eosinophils Relative: 6 %
HCT: 37.8 % (ref 36.0–46.0)
Hemoglobin: 12.2 g/dL (ref 12.0–15.0)
Immature Granulocytes: 0 %
Lymphocytes Relative: 23 %
Lymphs Abs: 1 K/uL (ref 0.7–4.0)
MCH: 27.5 pg (ref 26.0–34.0)
MCHC: 32.3 g/dL (ref 30.0–36.0)
MCV: 85.3 fL (ref 80.0–100.0)
Monocytes Absolute: 0.7 K/uL (ref 0.1–1.0)
Monocytes Relative: 16 %
Neutro Abs: 2.4 K/uL (ref 1.7–7.7)
Neutrophils Relative %: 54 %
Platelet Count: 175 K/uL (ref 150–400)
RBC: 4.43 MIL/uL (ref 3.87–5.11)
RDW: 16.2 % — ABNORMAL HIGH (ref 11.5–15.5)
WBC Count: 4.5 K/uL (ref 4.0–10.5)
nRBC: 0 % (ref 0.0–0.2)

## 2023-09-11 MED ORDER — FAM-TRASTUZUMAB DERUXTECAN-NXKI CHEMO 100 MG IV SOLR
300.0000 mg | Freq: Once | INTRAVENOUS | Status: AC
  Administered 2023-09-11: 300 mg via INTRAVENOUS
  Filled 2023-09-11: qty 15

## 2023-09-11 MED ORDER — DEXTROSE 5 % IV SOLN: Freq: Once | INTRAVENOUS | Status: AC

## 2023-09-11 MED ORDER — ACETAMINOPHEN 325 MG PO TABS
650.0000 mg | ORAL_TABLET | Freq: Once | ORAL | Status: AC
  Administered 2023-09-11: 650 mg via ORAL
  Filled 2023-09-11: qty 2

## 2023-09-11 MED ORDER — PALONOSETRON HCL INJECTION 0.25 MG/5ML
0.2500 mg | Freq: Once | INTRAVENOUS | Status: AC
  Administered 2023-09-11: 0.25 mg via INTRAVENOUS
  Filled 2023-09-11: qty 5

## 2023-09-11 MED ORDER — HEPARIN SOD (PORK) LOCK FLUSH 100 UNIT/ML IV SOLN
500.0000 [IU] | Freq: Once | INTRAVENOUS | Status: AC | PRN
  Administered 2023-09-11: 500 [IU]

## 2023-09-11 MED ORDER — CYANOCOBALAMIN 1000 MCG/ML IJ SOLN
1000.0000 ug | Freq: Once | INTRAMUSCULAR | Status: AC
  Administered 2023-09-11: 1000 ug via INTRAMUSCULAR
  Filled 2023-09-11: qty 1

## 2023-09-11 MED ORDER — SODIUM CHLORIDE 0.9% FLUSH
10.0000 mL | Freq: Once | INTRAVENOUS | Status: AC
  Administered 2023-09-11: 10 mL

## 2023-09-11 MED ORDER — SODIUM CHLORIDE 0.9 % IV SOLN
150.0000 mg | Freq: Once | INTRAVENOUS | Status: AC
  Administered 2023-09-11: 150 mg via INTRAVENOUS
  Filled 2023-09-11: qty 150

## 2023-09-11 MED ORDER — SODIUM CHLORIDE 0.9 % IV SOLN: Freq: Once | INTRAVENOUS | Status: AC

## 2023-09-11 MED ORDER — SODIUM CHLORIDE 0.9% FLUSH
10.0000 mL | INTRAVENOUS | Status: DC | PRN
  Administered 2023-09-11: 10 mL

## 2023-09-11 NOTE — Progress Notes (Signed)
 Okay not to give IV fluid in the treatment plan per Morna Cornetto-NP

## 2023-09-11 NOTE — Progress Notes (Unsigned)
 [facility] Cancer Follow up:    [name], MD [address] [place] [place] [address]   DIAGNOSIS: Cancer Staging  Malignant neoplasm of lower-outer quadrant of right breast of female, estrogen receptor positive (HCC) Staging form: Breast, AJCC 8th Edition - Clinical stage from 05/11/2020: Stage IA (cT1b, cN0, cM0, G3, ER+, PR-, HER2+) - Signed by Odean Potts, MD on 05/11/2020 Stage prefix: Initial diagnosis - Pathologic stage from 06/02/2020: Stage IA (pT1c, pN0, cM0, G3, ER+, PR-, HER2+) - Signed by Crawford Morna Pickle, NP on 06/15/2020 Stage prefix: Initial diagnosis Histologic grading system: 3 grade system    SUMMARY OF ONCOLOGIC HISTORY: Oncology History  Malignant neoplasm of lower-outer quadrant of right breast of female, estrogen receptor positive (HCC)  05/03/2020 Initial Diagnosis   Screening mammogram showed a 1.0cm mass at the 6 o'clock position in the right breast. Diagnostic mammogram and US  showed the 1.2cm mass at the 6 o'clock position in the right breast. Biopsy showed IDC, grade 3, HER-2 positive (3+), ER 15% weak, PR-, Ki67 80%.   05/19/2020 Genetic Testing   Negative hereditary cancer genetic testing: no pathogenic variants detected in [facility] Breast STAT Panel and Common Hereditary Cancers Panel.  The report dates are May 19, 2020 (STAT) and May 23, 2020 (Common Hereditary).   The Common Hereditary Cancers Panel offered by [facility] includes sequencing and/or deletion duplication testing of the following 47 genes: APC, ATM, AXIN2, BARD1, BMPR1A, BRCA1, BRCA2, BRIP1, CDH1, CDK4, CDKN2A (p14ARF), CDKN2A (p16INK4a), CHEK2, CTNNA1, DICER1, EPCAM (Deletion/duplication testing only), GREM1 (promoter region deletion/duplication testing only), GREM1, HOXB13, KIT, MEN1, MLH1, MSH2, MSH3, MSH6, MUTYH, NBN, NF1, NHTL1, PALB2, PDGFRA, PMS2, POLD1, POLE, PTEN, RAD50, RAD51C, RAD51D, SDHA, SDHB, SDHC, SDHD, SMAD4, SMARCA4. STK11, TP53, TSC1, TSC2, and  VHL.  The following genes were evaluated for sequence changes only: SDHA and HOXB13 c.251G>A variant only.es only: SDHA and HOXB13 c.251G>A variant only.   06/02/2020 Surgery   Right lumpectomy ([facility]): invasive and in situ ductal carcinoma, 1.3cm, clear margins, 1 right axillary lymph node negative for carcinoma.   06/02/2020 Cancer Staging   Staging form: Breast, AJCC 8th Edition - Pathologic stage from 06/02/2020: Stage IA (pT1c, pN0, cM0, G3, ER+, PR-, HER2+) - Signed by Crawford Morna Pickle, NP on 06/15/2020 Stage prefix: Initial diagnosis Histologic grading system: 3 grade system   07/08/2020 - 10/21/2020 Chemotherapy   Taxol  Herceptin  followed by Herceptin  maintenance   04/14/2021 -  Anti-estrogen oral therapy   Tamoxifen  10 mg   07/13/2021 Imaging   CT angiogram: Right supraclavicular lymph node 5 cm, right internal mammary lymph nodes 1.8 cm    08/06/2021 PET scan   PET CT scan: Ext Right Supra clav LN along with Internal mammary and Rt Upper Mediastinal and Rt Axillary LN   08/2021 Initial Biopsy   Right supraclavicular lymphadenopathy: Biopsy: Metastatic poorly differentiated adenocarcinoma, ER +40%, PR negative, HER2 positive 3+    08/21/2021 -  Chemotherapy   Enhertu  every 3 weeks    03/08/2022 Imaging   CT chest/abdomen/pelvis  1. No new or progressive findings in the chest, abdomen or pelvis. 2. Signs of RIGHT breast lumpectomy with areas of central fat necrosis unchanged from previous imaging. 3. Stable small lymph nodes throughout the chest, largest with area of soft tissue about surgical clips in the RIGHT axilla. 4. Stable appearance of thoracic inlet lymph nodes, not shown to be hypermetabolic on prior PET imaging. 5. IUD in-situ. 6. Aortic atherosclerosis.   08/21/2022 Imaging   Echocardiogram  -  normal LVEF at 60-65%. -no left ventricular wall motion abnormalities  -normal left ventricular diastolic function  -mild left ventricular hypertrophy.    10/05/2022  Imaging   CT Chest, Abdomen, and Pelvis with contrast IMPRESSION: 1. Stable examination without new or progressive finding in the chest, abdomen or pelvis to suggest recurrent or metastatic disease. 2. Stable tiny bilateral thoracic inlet lymph nodes. 3.  Aortic Atherosclerosis (ICD10-I70.0).     CURRENT THERAPY:  INTERVAL HISTORY:  Discussed the use of AI scribe software for clinical note transcription with the patient, who gave verbal consent to proceed.  Pamela [name] 58 y.o. female returns for    Patient Active Problem List   Diagnosis Date Noted   Adjustment disorder with mixed anxiety and depressed mood 07/27/2023   Chemotherapy induced nausea and vomiting 07/22/2023   Encounter for annual health examination 07/22/2023   Hypertensive heart disease without heart failure 05/09/2023   Transaminitis 05/09/2023   Hypophosphatemia 04/30/2023   Thrombocytopenia (HCC) 04/30/2023   Elevated troponin 04/30/2023   Normocytic anemia 04/30/2023   Grade I diastolic dysfunction 04/30/2023   Prolonged QT interval 04/30/2023   Drug-induced constipation 11/24/2022   Chemotherapy induced cardiomyopathy (HCC) 05/08/2022   Aortic atherosclerosis (HCC) 04/26/2022   Hypomagnesemia 11/26/2021   Hypokalemia 11/26/2021   Dizziness, fatigue, globus sensation 11/26/2021   Breast cancer (HCC) 08/25/2021   Nausea without vomiting 08/25/2021   Prediabetes 03/13/2021   Non-intractable vomiting 07/13/2020   Port-A-Cath in place 07/08/2020   Genetic testing 05/20/2020   Family history of breast cancer 05/12/2020   Family history of lung cancer 05/12/2020   Malignant neoplasm of lower-outer quadrant of right breast of female, estrogen receptor positive (HCC) 05/03/2020   Elevated troponin level not due myocardial infarction 09/18/2019   Mixed hyperlipidemia 09/18/2019   Hypertensive emergency 09/17/2019   Primary osteoarthritis of right hip 08/06/2018   Seasonal allergies 06/12/2018    Fibrocystic disease of breast 05/27/2018   Obstructive sleep apnea syndrome, moderate 10/27/2013   Shift work sleep disorder 10/27/2013   Psychic factors associated with diseases classified elsewhere 08/07/2013   Pure hypercholesterolemia 07/21/2013   Essential hypertension 07/21/2013   Snoring 07/21/2013   IUD contraception-inserted 10/01/11 10/01/2011   Class 3 severe obesity due to excess calories with serious comorbidity and body mass index (BMI) of 50.0 to 59.9 in adult 09/03/2011    is allergic to pollen extract, compazine  [prochlorperazine ], latex, codeine, diflucan  [fluconazole ], shellfish allergy, and betadine [povidone iodine].  MEDICAL HISTORY: Past Medical History:  Diagnosis Date   Anxiety    Asthma    Back pain    Breast cancer (HCC)    Constipation    DUB (dysfunctional uterine bleeding) 2007   Edema of both lower extremities    Elevated cholesterol    Family history of breast cancer 05/12/2020   Family history of lung cancer 05/12/2020   Food allergy    Grade I diastolic dysfunction 04/30/2023   H/O menorrhagia 05/01/2006   High cholesterol    History of ovarian cyst 2007   Hypertension 03/31/2005   Increased BMI 07/11/2006   Joint pain    Migraine    N&V (nausea and vomiting)    Obesity 2007   Obstructive sleep apnea syndrome, moderate 10/27/2013   no cpap   Personal history of chemotherapy    Personal history of radiation therapy    Sleep apnea    SOB (shortness of breath)    Vitamin D  deficiency    Vitamin D  deficiency  Weight loss 07/11/2006    SURGICAL HISTORY: Past Surgical History:  Procedure Laterality Date   BREAST LUMPECTOMY     BREAST LUMPECTOMY WITH RADIOACTIVE SEED AND SENTINEL LYMPH NODE BIOPSY Right 06/02/2020   Procedure: RIGHT BREAST LUMPECTOMY WITH RADIOACTIVE SEED AND RIGHT SENTINEL LYMPH NODE BIOPSY;  Surgeon: Vanderbilt Ned, MD;  Location: [facility];  Service: General;  Laterality: Right;   BREAST REDUCTION  SURGERY  05/04/1991   CESAREAN SECTION     HYSTEROSCOPY  05/01/2006   PORTACATH PLACEMENT Right 06/02/2020   Procedure: INSERTION PORT-A-CATH;  Surgeon: Vanderbilt Ned, MD;  Location: [facility];  Service: General;  Laterality: Right;   REDUCTION MAMMAPLASTY      SOCIAL HISTORY: Social History   Socioeconomic History   Marital status: Single    Spouse name: Not on file   Number of children: Not on file   Years of education: Not on file   Highest education level: Not on file  Occupational History   Occupation: Child psychotherapist  Tobacco Use   Smoking status: Never   Smokeless tobacco: Never  Vaping Use   Vaping status: Never Used  Substance and Sexual Activity   Alcohol use: No   Drug use: Not Currently    Frequency: 2.0 times per week   Sexual activity: Not Currently    Birth control/protection: I.U.D.    Comment: Mirena   Other Topics Concern   Not on file  Social History Narrative   Not on file   Social Drivers of Health   Financial Resource Strain: High Risk (03/14/2022)   Overall Financial Resource Strain (CARDIA)    Difficulty of Paying Living Expenses: Hard  Food Insecurity: No Food Insecurity (04/30/2023)   Hunger Vital Sign    Worried About Running Out of Food in the Last Year: Never true    Ran Out of Food in the Last Year: Never true  Transportation Needs: No Transportation Needs (04/30/2023)   PRAPARE - Administrator, Civil Service (Medical): No    Lack of Transportation (Non-Medical): No  Physical Activity: Not on file  Stress: Not on file  Social Connections: Moderately Isolated (04/30/2023)   Social Connection and Isolation Panel    Frequency of Communication with Friends and Family: More than three times a week    Frequency of Social Gatherings with Friends and Family: More than three times a week    Attends Religious Services: More than 4 times per year    Active Member of [employer] or Organizations: No    Attends Tax inspector Meetings: Not on file    Marital Status: Separated  Intimate Partner Violence: Not At Risk (04/30/2023)   Humiliation, Afraid, Rape, and Kick questionnaire    Fear of Current or Ex-Partner: No    Emotionally Abused: No    Physically Abused: No    Sexually Abused: No    FAMILY HISTORY: Family History  Problem Relation Age of Onset   Hypertension Mother    High Cholesterol Mother    Thyroid  disease Mother    Cancer Mother    Sleep apnea Mother    Hypertension Father    Heart attack Father    Sudden death Father    Diabetes Maternal Grandmother    Bone cancer Maternal Grandfather        dx after 95   Breast cancer Maternal Aunt        dx 30s   Lung cancer Maternal Aunt  dx after 50    Review of Systems  Constitutional:  Negative for appetite change, chills, fatigue, fever and unexpected weight change.  HENT:   Negative for hearing loss, lump/mass and trouble swallowing.   Eyes:  Negative for eye problems and icterus.  Respiratory:  Negative for chest tightness, cough and shortness of breath.   Cardiovascular:  Negative for chest pain, leg swelling and palpitations.  Gastrointestinal:  Negative for abdominal distention, abdominal pain, constipation, diarrhea, nausea and vomiting.  Endocrine: Negative for hot flashes.  Genitourinary:  Negative for difficulty urinating.   Musculoskeletal:  Negative for arthralgias.  Skin:  Negative for itching and rash.  Neurological:  Negative for dizziness, extremity weakness, headaches and numbness.  Hematological:  Negative for adenopathy. Does not bruise/bleed easily.  Psychiatric/Behavioral:  Negative for depression. The patient is not nervous/anxious.       PHYSICAL EXAMINATION    Vitals:   09/11/23 0954  BP: (!) 158/70  Pulse: 86  Resp: 18  Temp: 98.3 F (36.8 C)  SpO2: 100%    Physical Exam Constitutional:      General: She is not in acute distress.    Appearance: Normal appearance. She is not  toxic-appearing.  HENT:     Head: Normocephalic and atraumatic.     Mouth/Throat:     Mouth: Mucous membranes are moist.     Pharynx: Oropharynx is clear. No oropharyngeal exudate or posterior oropharyngeal erythema.  Eyes:     General: No scleral icterus. Cardiovascular:     Rate and Rhythm: Normal rate and regular rhythm.     Pulses: Normal pulses.     Heart sounds: Normal heart sounds.  Pulmonary:     Effort: Pulmonary effort is normal.     Breath sounds: Normal breath sounds.  Abdominal:     General: Abdomen is flat. Bowel sounds are normal. There is no distension.     Palpations: Abdomen is soft.     Tenderness: There is no abdominal tenderness.  Musculoskeletal:        General: No swelling.     Cervical back: Neck supple.  Lymphadenopathy:     Cervical: No cervical adenopathy.  Skin:    General: Skin is warm and dry.     Findings: No rash.  Neurological:     General: No focal deficit present.     Mental Status: She is alert.  Psychiatric:        Mood and Affect: Mood normal.        Behavior: Behavior normal.     LABORATORY DATA:  CBC    Component Value Date/Time   WBC 4.5 09/11/2023 0934   WBC 6.5 05/03/2023 0522   RBC 4.43 09/11/2023 0934   HGB 12.2 09/11/2023 0934   HGB 13.2 09/22/2019 1040   HCT 37.8 09/11/2023 0934   HCT 41.3 09/22/2019 1040   PLT 175 09/11/2023 0934   PLT 247 09/22/2019 1040   MCV 85.3 09/11/2023 0934   MCV 83 09/22/2019 1040   MCH 27.5 09/11/2023 0934   MCHC 32.3 09/11/2023 0934   RDW 16.2 (H) 09/11/2023 0934   RDW 14.7 09/22/2019 1040   LYMPHSABS 1.0 09/11/2023 0934   MONOABS 0.7 09/11/2023 0934   EOSABS 0.3 09/11/2023 0934   BASOSABS 0.1 09/11/2023 0934    CMP     Component Value Date/Time   NA 143 08/13/2023 1111   NA 142 09/22/2019 1040   K 3.2 (L) 08/13/2023 1111   CL 109 08/13/2023 1111  CO2 30 08/13/2023 1111   GLUCOSE 96 08/13/2023 1111   BUN 9 08/13/2023 1111   BUN 11 09/22/2019 1040   CREATININE 0.65  08/13/2023 1111   CALCIUM  8.8 (L) 08/13/2023 1111   PROT 6.3 (L) 08/13/2023 1111   PROT 6.7 06/17/2019 1054   ALBUMIN 3.3 (L) 08/13/2023 1111   ALBUMIN 4.5 06/17/2019 1054   AST 44 (H) 08/13/2023 1111   ALT 28 08/13/2023 1111   ALKPHOS 94 08/13/2023 1111   BILITOT 1.7 (H) 08/13/2023 1111   GFRNONAA >60 08/13/2023 1111   GFRAA 77 09/22/2019 1040     ASSESSMENT and THERAPY PLAN:   No problem-specific Assessment & Plan notes found for this encounter.     All questions were answered. The patient knows to call the clinic with any problems, questions or concerns. We can certainly see the patient much sooner if necessary.  Total encounter time:*** minutes*in face-to-face visit time, chart review, lab review, care coordination, order entry, and documentation of the encounter time.    Morna Kendall, NP 09/11/23 10:08 AM Medical Oncology and Hematology [facility] [address] [place], [place] [address] Tel. [phone]    Fax. [phone]  *Total Encounter Time as defined by the Centers for Medicare and Medicaid Services includes, in addition to the face-to-face time of a patient visit (documented in the note above) non-face-to-face time: obtaining and reviewing outside history, ordering and reviewing medications, tests or procedures, care coordination (communications with other health care professionals or caregivers) and documentation in the medical record.

## 2023-09-12 ENCOUNTER — Encounter: Payer: Self-pay | Admitting: Hematology and Oncology

## 2023-09-12 NOTE — Assessment & Plan Note (Signed)
 06/02/2020:Right lumpectomy ([facility]): invasive and in situ ductal carcinoma, 1.3cm, clear margins, 1 right axillary lymph node negative for carcinoma. ER 15% weak, PR-,HER-2 positive (3+) Ki67 80%.   Treatment plan: 1.  Adjuvant chemotherapy with Taxol  Herceptin  followed by Herceptin  maintenance.  Stopped after 11 cycles of Taxol  Herceptin  maintenance completed 07/07/2021  2.  Adjuvant radiation therapy 11/23/2020-12/19/2020 3.  Adjuvant antiestrogen therapy with tamoxifen  started October 2022 4.  CT angiogram 07/13/2021: Right supraclavicular lymph node 5 cm, right internal mammary lymph nodes 1.8 cm 5.  Metastatic breast cancer: June 2023:Right supraclavicular lymphadenopathy: Biopsy: Metastatic poorly differentiated adenocarcinoma, ER +40%, PR negative, HER2 positive 3+ PET CT scan 08/06/21: Ext Right Supra clav LN along with Internal mammary and Rt Upper Mediastinal and Rt Axillary LN    CT chest abdomen pelvis 06/25/2022: Stable exam.  No new or progressive findings. CT CAP 10/04/2022: Stable findings without new or progressive disease CT CAP 02/22/2023: Stable findings without new or progressive disease.  Pneumonia that was noted on chest x-ray from earlier in the month was again noted. CT CAP 06/10/2023: Small supraclavicular lymph node on the left 7 mm, right axillary nodular thickening 1.5 cm overall stable disease, and remained in lung infiltrates. Echocardiogram every 4 months based upon Dr. Nelle recommendation. -----------------------------------------------------------------------------------------------------  Current treatment: Enhertu  started on 08/21/2021 now every 4 weeks Enhertu  toxicities:   Hospitalization 04/29/2023-05/04/2023: Community-acquired pneumonia Chemo-induced peripheral neuropathy Scans 06/2023 showed stable disease, tolerating well   Assessment and Plan Assessment & Plan Arm pain and limited mobility Persistent pain and limited mobility post work related-injury.  X-ray performed. Physical therapy scheduled. Concern about potential further damage due to delayed appointments. - Follow up with orthopedic specialist as scheduled. - Proceed with physical therapy as planned. - Continue f/u with work about appointments and seeing the specialists recommended by her employer.   Swelling in hands and feet Intermittent swelling, more pronounced in mornings. Some improvement in feet. Managed with medication and dietary adjustments. - Continue current management with prescribed medication and dietary adjustments.  Hypertension Hypertension managed with medication. Improved blood pressure readings. Compliant with medication and dietary recommendations. - Continue current antihypertensive medication regimen. - Maintain dietary adjustments to support blood pressure control.  RTC in 4 weeks for labs, f/u, and next treatment.

## 2023-09-20 ENCOUNTER — Other Ambulatory Visit: Payer: Self-pay | Admitting: Specialist

## 2023-09-20 DIAGNOSIS — M25562 Pain in left knee: Secondary | ICD-10-CM

## 2023-09-26 ENCOUNTER — Other Ambulatory Visit

## 2023-09-30 LAB — HM PAP SMEAR: HPV, high-risk: NEGATIVE

## 2023-10-08 ENCOUNTER — Other Ambulatory Visit (HOSPITAL_COMMUNITY): Payer: Self-pay | Admitting: Internal Medicine

## 2023-10-08 MED FILL — Fosaprepitant Dimeglumine For IV Infusion 150 MG (Base Eq): INTRAVENOUS | Qty: 5 | Status: AC

## 2023-10-09 ENCOUNTER — Encounter: Payer: Self-pay | Admitting: Adult Health

## 2023-10-09 ENCOUNTER — Inpatient Hospital Stay

## 2023-10-09 ENCOUNTER — Encounter: Payer: Self-pay | Admitting: Hematology and Oncology

## 2023-10-09 ENCOUNTER — Inpatient Hospital Stay: Attending: Hematology and Oncology

## 2023-10-09 ENCOUNTER — Inpatient Hospital Stay (HOSPITAL_BASED_OUTPATIENT_CLINIC_OR_DEPARTMENT_OTHER): Admitting: Adult Health

## 2023-10-09 VITALS — BP 185/95 | HR 75

## 2023-10-09 VITALS — BP 188/87 | HR 89 | Temp 97.0°F | Resp 18 | Ht 60.0 in | Wt 327.7 lb

## 2023-10-09 DIAGNOSIS — Z833 Family history of diabetes mellitus: Secondary | ICD-10-CM | POA: Insufficient documentation

## 2023-10-09 DIAGNOSIS — R110 Nausea: Secondary | ICD-10-CM

## 2023-10-09 DIAGNOSIS — Z9221 Personal history of antineoplastic chemotherapy: Secondary | ICD-10-CM | POA: Insufficient documentation

## 2023-10-09 DIAGNOSIS — G4733 Obstructive sleep apnea (adult) (pediatric): Secondary | ICD-10-CM | POA: Insufficient documentation

## 2023-10-09 DIAGNOSIS — Z95828 Presence of other vascular implants and grafts: Secondary | ICD-10-CM

## 2023-10-09 DIAGNOSIS — C50919 Malignant neoplasm of unspecified site of unspecified female breast: Secondary | ICD-10-CM

## 2023-10-09 DIAGNOSIS — I10 Essential (primary) hypertension: Secondary | ICD-10-CM | POA: Insufficient documentation

## 2023-10-09 DIAGNOSIS — Z1722 Progesterone receptor negative status: Secondary | ICD-10-CM | POA: Insufficient documentation

## 2023-10-09 DIAGNOSIS — Z803 Family history of malignant neoplasm of breast: Secondary | ICD-10-CM | POA: Diagnosis not present

## 2023-10-09 DIAGNOSIS — F32A Depression, unspecified: Secondary | ICD-10-CM | POA: Diagnosis not present

## 2023-10-09 DIAGNOSIS — Z825 Family history of asthma and other chronic lower respiratory diseases: Secondary | ICD-10-CM | POA: Insufficient documentation

## 2023-10-09 DIAGNOSIS — Z1731 Human epidermal growth factor receptor 2 positive status: Secondary | ICD-10-CM | POA: Diagnosis not present

## 2023-10-09 DIAGNOSIS — C50511 Malignant neoplasm of lower-outer quadrant of right female breast: Secondary | ICD-10-CM

## 2023-10-09 DIAGNOSIS — Z83438 Family history of other disorder of lipoprotein metabolism and other lipidemia: Secondary | ICD-10-CM | POA: Insufficient documentation

## 2023-10-09 DIAGNOSIS — Z79899 Other long term (current) drug therapy: Secondary | ICD-10-CM | POA: Insufficient documentation

## 2023-10-09 DIAGNOSIS — Z801 Family history of malignant neoplasm of trachea, bronchus and lung: Secondary | ICD-10-CM | POA: Diagnosis not present

## 2023-10-09 DIAGNOSIS — D696 Thrombocytopenia, unspecified: Secondary | ICD-10-CM | POA: Insufficient documentation

## 2023-10-09 DIAGNOSIS — I700 Atherosclerosis of aorta: Secondary | ICD-10-CM | POA: Diagnosis not present

## 2023-10-09 DIAGNOSIS — Z170 Estrogen receptor positive status [ER+]: Secondary | ICD-10-CM | POA: Insufficient documentation

## 2023-10-09 DIAGNOSIS — Z5986 Financial insecurity: Secondary | ICD-10-CM | POA: Insufficient documentation

## 2023-10-09 DIAGNOSIS — N6315 Unspecified lump in the right breast, overlapping quadrants: Secondary | ICD-10-CM | POA: Diagnosis not present

## 2023-10-09 DIAGNOSIS — F419 Anxiety disorder, unspecified: Secondary | ICD-10-CM | POA: Insufficient documentation

## 2023-10-09 DIAGNOSIS — Z8349 Family history of other endocrine, nutritional and metabolic diseases: Secondary | ICD-10-CM | POA: Insufficient documentation

## 2023-10-09 DIAGNOSIS — Z8249 Family history of ischemic heart disease and other diseases of the circulatory system: Secondary | ICD-10-CM | POA: Insufficient documentation

## 2023-10-09 DIAGNOSIS — M170 Bilateral primary osteoarthritis of knee: Secondary | ICD-10-CM | POA: Diagnosis not present

## 2023-10-09 DIAGNOSIS — R7989 Other specified abnormal findings of blood chemistry: Secondary | ICD-10-CM | POA: Insufficient documentation

## 2023-10-09 DIAGNOSIS — Z5112 Encounter for antineoplastic immunotherapy: Secondary | ICD-10-CM | POA: Insufficient documentation

## 2023-10-09 DIAGNOSIS — Z808 Family history of malignant neoplasm of other organs or systems: Secondary | ICD-10-CM | POA: Insufficient documentation

## 2023-10-09 LAB — CBC WITH DIFFERENTIAL (CANCER CENTER ONLY)
Abs Immature Granulocytes: 0.04 K/uL (ref 0.00–0.07)
Basophils Absolute: 0 K/uL (ref 0.0–0.1)
Basophils Relative: 1 %
Eosinophils Absolute: 0.2 K/uL (ref 0.0–0.5)
Eosinophils Relative: 3 %
HCT: 38 % (ref 36.0–46.0)
Hemoglobin: 12.4 g/dL (ref 12.0–15.0)
Immature Granulocytes: 1 %
Lymphocytes Relative: 18 %
Lymphs Abs: 1.1 K/uL (ref 0.7–4.0)
MCH: 27.9 pg (ref 26.0–34.0)
MCHC: 32.6 g/dL (ref 30.0–36.0)
MCV: 85.4 fL (ref 80.0–100.0)
Monocytes Absolute: 0.9 K/uL (ref 0.1–1.0)
Monocytes Relative: 15 %
Neutro Abs: 4 K/uL (ref 1.7–7.7)
Neutrophils Relative %: 62 %
Platelet Count: 162 K/uL (ref 150–400)
RBC: 4.45 MIL/uL (ref 3.87–5.11)
RDW: 16.3 % — ABNORMAL HIGH (ref 11.5–15.5)
WBC Count: 6.3 K/uL (ref 4.0–10.5)
nRBC: 0 % (ref 0.0–0.2)

## 2023-10-09 LAB — CMP (CANCER CENTER ONLY)
ALT: 37 U/L (ref 0–44)
AST: 43 U/L — ABNORMAL HIGH (ref 15–41)
Albumin: 3.5 g/dL (ref 3.5–5.0)
Alkaline Phosphatase: 130 U/L — ABNORMAL HIGH (ref 38–126)
Anion gap: 3 — ABNORMAL LOW (ref 5–15)
BUN: 7 mg/dL (ref 6–20)
CO2: 32 mmol/L (ref 22–32)
Calcium: 8.8 mg/dL — ABNORMAL LOW (ref 8.9–10.3)
Chloride: 106 mmol/L (ref 98–111)
Creatinine: 0.74 mg/dL (ref 0.44–1.00)
GFR, Estimated: >60 mL/min (ref >60)
Glucose, Bld: 137 mg/dL — ABNORMAL HIGH (ref 70–99)
Potassium: 3.4 mmol/L — ABNORMAL LOW (ref 3.5–5.1)
Sodium: 141 mmol/L (ref 135–145)
Total Bilirubin: 1.4 mg/dL — ABNORMAL HIGH (ref 0.0–1.2)
Total Protein: 6.4 g/dL — ABNORMAL LOW (ref 6.5–8.1)

## 2023-10-09 MED ORDER — SODIUM CHLORIDE 0.9% FLUSH
10.0000 mL | Freq: Once | INTRAVENOUS | Status: AC
  Administered 2023-10-09: 10 mL

## 2023-10-09 MED ORDER — SPIRONOLACTONE 25 MG PO TABS: 25.0000 mg | ORAL_TABLET | Freq: Every day | ORAL | 6 refills | Status: AC

## 2023-10-09 MED ORDER — ACETAMINOPHEN 325 MG PO TABS
650.0000 mg | ORAL_TABLET | Freq: Once | ORAL | Status: AC
  Administered 2023-10-09: 650 mg via ORAL
  Filled 2023-10-09: qty 2

## 2023-10-09 MED ORDER — DEXTROSE 5 % IV SOLN: Freq: Once | INTRAVENOUS | Status: AC

## 2023-10-09 MED ORDER — SODIUM CHLORIDE 0.9% FLUSH
10.0000 mL | INTRAVENOUS | Status: DC | PRN
Start: 2023-10-09 — End: 2023-10-09

## 2023-10-09 MED ORDER — PALONOSETRON HCL INJECTION 0.25 MG/5ML
0.2500 mg | Freq: Once | INTRAVENOUS | Status: AC
  Administered 2023-10-09: 0.25 mg via INTRAVENOUS
  Filled 2023-10-09: qty 5

## 2023-10-09 MED ORDER — FAM-TRASTUZUMAB DERUXTECAN-NXKI CHEMO 100 MG IV SOLR
300.0000 mg | Freq: Once | INTRAVENOUS | Status: AC
  Administered 2023-10-09: 300 mg via INTRAVENOUS
  Filled 2023-10-09: qty 15

## 2023-10-09 MED ORDER — FOSAPREPITANT DIMEGLUMINE INJECTION 150 MG
150.0000 mg | Freq: Once | INTRAVENOUS | Status: AC
  Administered 2023-10-09: 150 mg via INTRAVENOUS
  Filled 2023-10-09: qty 150

## 2023-10-09 MED ORDER — CYANOCOBALAMIN 1000 MCG/ML IJ SOLN
1000.0000 ug | Freq: Once | INTRAMUSCULAR | Status: AC
  Administered 2023-10-09: 1000 ug via INTRAMUSCULAR

## 2023-10-09 NOTE — Progress Notes (Signed)
 Per Morna NP, ok to treat with elevated BP.

## 2023-10-09 NOTE — Patient Instructions (Signed)
 [facility] - A DEPT OF [facility]  Discharge Instructions: Thank you for choosing [facility] to provide your oncology and hematology care.   If you have a lab appointment with the Cancer Center, please go directly to the Cancer Center and check in at the registration area.   Wear comfortable clothing and clothing appropriate for easy access to any Portacath or PICC line.   We strive to give you quality time with your provider. You may need to reschedule your appointment if you arrive late (15 or more minutes).  Arriving late affects you and other patients whose appointments are after yours.  Also, if you miss three or more appointments without notifying the office, you may be dismissed from the clinic at the provider's discretion.      For prescription refill requests, have your pharmacy contact our office and allow 72 hours for refills to be completed.    Today you received the following chemotherapy and/or immunotherapy agents Enhertu      To help prevent nausea and vomiting after your treatment, we encourage you to take your nausea medication as directed.  BELOW ARE SYMPTOMS THAT SHOULD BE REPORTED IMMEDIATELY: *FEVER GREATER THAN 100.4 F (38 C) OR HIGHER *CHILLS OR SWEATING *NAUSEA AND VOMITING THAT IS NOT CONTROLLED WITH YOUR NAUSEA MEDICATION *UNUSUAL SHORTNESS OF BREATH *UNUSUAL BRUISING OR BLEEDING *URINARY PROBLEMS (pain or burning when urinating, or frequent urination) *BOWEL PROBLEMS (unusual diarrhea, constipation, pain near the anus) TENDERNESS IN MOUTH AND THROAT WITH OR WITHOUT PRESENCE OF ULCERS (sore throat, sores in mouth, or a toothache) UNUSUAL RASH, SWELLING OR PAIN  UNUSUAL VAGINAL DISCHARGE OR ITCHING   Items with * indicate a potential emergency and should be followed up as soon as possible or go to the Emergency Department if any problems should occur.  Please show the CHEMOTHERAPY ALERT CARD or IMMUNOTHERAPY  ALERT CARD at check-in to the Emergency Department and triage nurse.  Should you have questions after your visit or need to cancel or reschedule your appointment, please contact [facility] - A DEPT OF [facility]  Dept: [phone]  and follow the prompts.  Office hours are 8:00 a.m. to 4:30 p.m. Monday - Friday. Please note that voicemails left after 4:00 p.m. may not be returned until the following business day.  We are closed weekends and major holidays. You have access to a nurse at all times for urgent questions. Please call the main number to the clinic Dept: [phone] and follow the prompts.   For any non-urgent questions, you may also contact your provider using MyChart. We now offer e-Visits for anyone 19 and older to request care online for non-urgent symptoms. For details visit [email or web address].   Also download the MyChart app! Go to the app store, search "MyChart", open the app, select [facility], and log in with your MyChart username and password.

## 2023-10-09 NOTE — Progress Notes (Signed)
 Per Morna Kendall, DNP, OK to treat today with BP 185/85. Pt given instructions to take Lasix  today and to pick up RX the NP sent in for her. This information was given to infusion RN.

## 2023-10-11 ENCOUNTER — Encounter: Payer: Self-pay | Admitting: Adult Health

## 2023-10-11 ENCOUNTER — Encounter: Payer: Self-pay | Admitting: Hematology and Oncology

## 2023-10-11 NOTE — Progress Notes (Signed)
 [facility] Cancer Follow up:    Pamela Medici, MD [address] [place] [address] [place] [address]   DIAGNOSIS:  Cancer Staging  Malignant neoplasm of lower-outer quadrant of right breast of female, estrogen receptor positive (HCC) Staging form: Breast, AJCC 8th Edition - Clinical stage from 05/11/2020: Stage IA (cT1b, cN0, cM0, G3, ER+, PR-, HER2+) - Signed by Odean Potts, MD on 05/11/2020 Stage prefix: Initial diagnosis - Pathologic stage from 06/02/2020: Stage IA (pT1c, pN0, cM0, G3, ER+, PR-, HER2+) - Signed by Crawford Morna Pickle, NP on 06/15/2020 Stage prefix: Initial diagnosis Histologic grading system: 3 grade system    SUMMARY OF ONCOLOGIC HISTORY: Oncology History  Malignant neoplasm of lower-outer quadrant of right breast of female, estrogen receptor positive (HCC)  05/03/2020 Initial Diagnosis   Screening mammogram showed a 1.0cm mass at the 6 o'clock position in the right breast. Diagnostic mammogram and US  showed the 1.2cm mass at the 6 o'clock position in the right breast. Biopsy showed IDC, grade 3, HER-2 positive (3+), ER 15% weak, PR-, Ki67 80%.   05/19/2020 Genetic Testing   Negative hereditary cancer genetic testing: no pathogenic variants detected in [facility] Breast STAT Panel and Common Hereditary Cancers Panel.  The report dates are May 19, 2020 (STAT) and May 23, 2020 (Common Hereditary).   The Common Hereditary Cancers Panel offered by [facility] includes sequencing and/or deletion duplication testing of the following 47 genes: APC, ATM, AXIN2, BARD1, BMPR1A, BRCA1, BRCA2, BRIP1, CDH1, CDK4, CDKN2A (p14ARF), CDKN2A (p16INK4a), CHEK2, CTNNA1, DICER1, EPCAM (Deletion/duplication testing only), GREM1 (promoter region deletion/duplication testing only), GREM1, HOXB13, KIT, MEN1, MLH1, MSH2, MSH3, MSH6, MUTYH, NBN, NF1, NHTL1, PALB2, PDGFRA, PMS2, POLD1, POLE, PTEN, RAD50, RAD51C, RAD51D, SDHA, SDHB, SDHC, SDHD, SMAD4, SMARCA4. STK11, TP53, TSC1, TSC2, and  VHL.  The following genes were evaluated for sequence changes only: SDHA and HOXB13 c.251G>A variant only.es only: SDHA and HOXB13 c.251G>A variant only.   06/02/2020 Surgery   Right lumpectomy ([facility]): invasive and in situ ductal carcinoma, 1.3cm, clear margins, 1 right axillary lymph node negative for carcinoma.   06/02/2020 Cancer Staging   Staging form: Breast, AJCC 8th Edition - Pathologic stage from 06/02/2020: Stage IA (pT1c, pN0, cM0, G3, ER+, PR-, HER2+) - Signed by Crawford Morna Pickle, NP on 06/15/2020 Stage prefix: Initial diagnosis Histologic grading system: 3 grade system   07/08/2020 - 10/21/2020 Chemotherapy   Taxol  Herceptin  followed by Herceptin  maintenance   04/14/2021 -  Anti-estrogen oral therapy   Tamoxifen  10 mg   07/13/2021 Imaging   CT angiogram: Right supraclavicular lymph node 5 cm, right internal mammary lymph nodes 1.8 cm    08/06/2021 PET scan   PET CT scan: Ext Right Supra clav LN along with Internal mammary and Rt Upper Mediastinal and Rt Axillary LN   08/2021 Initial Biopsy   Right supraclavicular lymphadenopathy: Biopsy: Metastatic poorly differentiated adenocarcinoma, ER +40%, PR negative, HER2 positive 3+    08/21/2021 -  Chemotherapy   Enhertu  every 3 weeks    03/08/2022 Imaging   CT chest/abdomen/pelvis  1. No new or progressive findings in the chest, abdomen or pelvis. 2. Signs of RIGHT breast lumpectomy with areas of central fat necrosis unchanged from previous imaging. 3. Stable small lymph nodes throughout the chest, largest with area of soft tissue about surgical clips in the RIGHT axilla. 4. Stable appearance of thoracic inlet lymph nodes, not shown to be hypermetabolic on prior PET imaging. 5. IUD in-situ. 6. Aortic atherosclerosis.   08/21/2022 Imaging  Echocardiogram  -normal LVEF at 60-65%. -no left ventricular wall motion abnormalities  -normal left ventricular diastolic function  -mild left ventricular hypertrophy.    10/05/2022  Imaging   CT Chest, Abdomen, and Pelvis with contrast IMPRESSION: 1. Stable examination without new or progressive finding in the chest, abdomen or pelvis to suggest recurrent or metastatic disease. 2. Stable tiny bilateral thoracic inlet lymph nodes. 3.  Aortic Atherosclerosis (ICD10-I70.0).     CURRENT THERAPY: Enhertu   INTERVAL HISTORY:  Discussed the use of AI scribe software for clinical note transcription with the patient, who gave verbal consent to proceed.  History of Present Illness Pamela [name] is a 58 year old female with metastatic breast cancer who presents for follow-up and evaluation prior to receiving Enhertu  treatment.  Recent imaging, including a CT scan on June 07, 2023, shows no progression of metastatic breast cancer. A bilateral breast mammogram on August 08, 2023, reveals no mammographic evidence of malignancy, with a density category B. An echocardiogram on August 26, 2023, indicates a left ventricular ejection fraction of 60 to 65%.  She experiences bilateral lower extremity swelling. An MRI revealed arthritis and a torn meniscus. She received a cortisone shot and is scheduled to start physical therapy. She uses a cane for mobility and has been mostly housebound.  She has elevated blood pressure today, attributed to pain and anxiety. She monitors her blood pressure at home. She is currently taking Lasix  (furosemide ) and is out of spironolactone , leading to increased fluid retention and swelling. Her weight has increased, possibly due to decreased activity from knee and arm pain. She is frustrated with the scheduling of her medical appointments and the coordination of her care.     Patient Active Problem List   Diagnosis Date Noted   Adjustment disorder with mixed anxiety and depressed mood 07/27/2023   Chemotherapy induced nausea and vomiting 07/22/2023   Encounter for annual health examination 07/22/2023   Hypertensive heart disease without heart failure  05/09/2023   Transaminitis 05/09/2023   Hypophosphatemia 04/30/2023   Thrombocytopenia (HCC) 04/30/2023   Elevated troponin 04/30/2023   Normocytic anemia 04/30/2023   Grade I diastolic dysfunction 04/30/2023   Prolonged QT interval 04/30/2023   Drug-induced constipation 11/24/2022   Chemotherapy induced cardiomyopathy (HCC) 05/08/2022   Aortic atherosclerosis (HCC) 04/26/2022   Hypomagnesemia 11/26/2021   Hypokalemia 11/26/2021   Dizziness, fatigue, globus sensation 11/26/2021   Breast cancer (HCC) 08/25/2021   Nausea without vomiting 08/25/2021   Prediabetes 03/13/2021   Non-intractable vomiting 07/13/2020   Port-A-Cath in place 07/08/2020   Genetic testing 05/20/2020   Family history of breast cancer 05/12/2020   Family history of lung cancer 05/12/2020   Malignant neoplasm of lower-outer quadrant of right breast of female, estrogen receptor positive (HCC) 05/03/2020   Elevated troponin level not due myocardial infarction 09/18/2019   Mixed hyperlipidemia 09/18/2019   Hypertensive emergency 09/17/2019   Primary osteoarthritis of right hip 08/06/2018   Seasonal allergies 06/12/2018   Fibrocystic disease of breast 05/27/2018   Obstructive sleep apnea syndrome, moderate 10/27/2013   Shift work sleep disorder 10/27/2013   Psychic factors associated with diseases classified elsewhere 08/07/2013   Pure hypercholesterolemia 07/21/2013   Essential hypertension 07/21/2013   Snoring 07/21/2013   IUD contraception-inserted 10/01/11 10/01/2011   Class 3 severe obesity due to excess calories with serious comorbidity and body mass index (BMI) of 50.0 to 59.9 in adult 09/03/2011    is allergic to pollen extract, compazine  [prochlorperazine ], latex, codeine, diflucan  [fluconazole ], shellfish allergy,  and betadine [povidone iodine].  MEDICAL HISTORY: Past Medical History:  Diagnosis Date   Anxiety    Asthma    Back pain    Breast cancer (HCC)    Constipation    DUB (dysfunctional  uterine bleeding) 2007   Edema of both lower extremities    Elevated cholesterol    Family history of breast cancer 05/12/2020   Family history of lung cancer 05/12/2020   Food allergy    Grade I diastolic dysfunction 04/30/2023   H/O menorrhagia 05/01/2006   High cholesterol    History of ovarian cyst 2007   Hypertension 03/31/2005   Increased BMI 07/11/2006   Joint pain    Migraine    N&V (nausea and vomiting)    Obesity 2007   Obstructive sleep apnea syndrome, moderate 10/27/2013   no cpap   Personal history of chemotherapy    Personal history of radiation therapy    Sleep apnea    SOB (shortness of breath)    Vitamin D  deficiency    Vitamin D  deficiency    Weight loss 07/11/2006    SURGICAL HISTORY: Past Surgical History:  Procedure Laterality Date   BREAST LUMPECTOMY     BREAST LUMPECTOMY WITH RADIOACTIVE SEED AND SENTINEL LYMPH NODE BIOPSY Right 06/02/2020   Procedure: RIGHT BREAST LUMPECTOMY WITH RADIOACTIVE SEED AND RIGHT SENTINEL LYMPH NODE BIOPSY;  Surgeon: Vanderbilt Ned, MD;  Location: [facility];  Service: General;  Laterality: Right;   BREAST REDUCTION SURGERY  05/04/1991   CESAREAN SECTION     HYSTEROSCOPY  05/01/2006   PORTACATH PLACEMENT Right 06/02/2020   Procedure: INSERTION PORT-A-CATH;  Surgeon: Vanderbilt Ned, MD;  Location: [facility];  Service: General;  Laterality: Right;   REDUCTION MAMMAPLASTY      SOCIAL HISTORY: Social History   Socioeconomic History   Marital status: Single    Spouse name: Not on file   Number of children: Not on file   Years of education: Not on file   Highest education level: Not on file  Occupational History   Occupation: Child psychotherapist  Tobacco Use   Smoking status: Never   Smokeless tobacco: Never  Vaping Use   Vaping status: Never Used  Substance and Sexual Activity   Alcohol use: No   Drug use: Not Currently    Frequency: 2.0 times per week   Sexual activity: Not Currently     Birth control/protection: I.U.D.    Comment: Mirena   Other Topics Concern   Not on file  Social History Narrative   Not on file   Social Drivers of Health   Financial Resource Strain: High Risk (03/14/2022)   Overall Financial Resource Strain (CARDIA)    Difficulty of Paying Living Expenses: Hard  Food Insecurity: No Food Insecurity (04/30/2023)   Hunger Vital Sign    Worried About Running Out of Food in the Last Year: Never true    Ran Out of Food in the Last Year: Never true  Transportation Needs: No Transportation Needs (04/30/2023)   PRAPARE - Administrator, Civil Service (Medical): No    Lack of Transportation (Non-Medical): No  Physical Activity: Not on file  Stress: Not on file  Social Connections: Moderately Isolated (04/30/2023)   Social Connection and Isolation Panel    Frequency of Communication with Friends and Family: More than three times a week    Frequency of Social Gatherings with Friends and Family: More than three times a week    Attends  Religious Services: More than 4 times per year    Active Member of Clubs or Organizations: No    Attends Banker Meetings: Not on file    Marital Status: Separated  Intimate Partner Violence: Not At Risk (04/30/2023)   Humiliation, Afraid, Rape, and Kick questionnaire    Fear of Current or Ex-Partner: No    Emotionally Abused: No    Physically Abused: No    Sexually Abused: No    FAMILY HISTORY: Family History  Problem Relation Age of Onset   Hypertension Mother    High Cholesterol Mother    Thyroid  disease Mother    Cancer Mother    Sleep apnea Mother    Hypertension Father    Heart attack Father    Sudden death Father    Diabetes Maternal Grandmother    Bone cancer Maternal Grandfather        dx after 68   Breast cancer Maternal Aunt        dx 30s   Lung cancer Maternal Aunt        dx after 50    Review of Systems - Oncology    PHYSICAL EXAMINATION    Vitals:   10/09/23  1100  BP: (!) 188/87  Pulse: 89  Resp: 18  Temp: (!) 97 F (36.1 C)  SpO2: 99%    Physical Exam  LABORATORY DATA:  CBC    Component Value Date/Time   WBC 6.3 10/09/2023 1042   WBC 6.5 05/03/2023 0522   RBC 4.45 10/09/2023 1042   HGB 12.4 10/09/2023 1042   HGB 13.2 09/22/2019 1040   HCT 38.0 10/09/2023 1042   HCT 41.3 09/22/2019 1040   PLT 162 10/09/2023 1042   PLT 247 09/22/2019 1040   MCV 85.4 10/09/2023 1042   MCV 83 09/22/2019 1040   MCH 27.9 10/09/2023 1042   MCHC 32.6 10/09/2023 1042   RDW 16.3 (H) 10/09/2023 1042   RDW 14.7 09/22/2019 1040   LYMPHSABS 1.1 10/09/2023 1042   MONOABS 0.9 10/09/2023 1042   EOSABS 0.2 10/09/2023 1042   BASOSABS 0.0 10/09/2023 1042    CMP     Component Value Date/Time   NA 141 10/09/2023 1042   NA 142 09/22/2019 1040   K 3.4 (L) 10/09/2023 1042   CL 106 10/09/2023 1042   CO2 32 10/09/2023 1042   GLUCOSE 137 (H) 10/09/2023 1042   BUN 7 10/09/2023 1042   BUN 11 09/22/2019 1040   CREATININE 0.74 10/09/2023 1042   CALCIUM  8.8 (L) 10/09/2023 1042   PROT 6.4 (L) 10/09/2023 1042   PROT 6.7 06/17/2019 1054   ALBUMIN 3.5 10/09/2023 1042   ALBUMIN 4.5 06/17/2019 1054   AST 43 (H) 10/09/2023 1042   ALT 37 10/09/2023 1042   ALKPHOS 130 (H) 10/09/2023 1042   BILITOT 1.4 (H) 10/09/2023 1042   GFRNONAA >60 10/09/2023 1042   GFRAA 77 09/22/2019 1040     ASSESSMENT and THERAPY PLAN:   Malignant neoplasm of lower-outer quadrant of right breast of female, estrogen receptor positive (HCC) 06/02/2020:Right lumpectomy ([facility]): invasive and in situ ductal carcinoma, 1.3cm, clear margins, 1 right axillary lymph node negative for carcinoma. ER 15% weak, PR-,HER-2 positive (3+) Ki67 80%.   Treatment plan: 1.  Adjuvant chemotherapy with Taxol  Herceptin  followed by Herceptin  maintenance.  Stopped after 11 cycles of Taxol  Herceptin  maintenance completed 07/07/2021  2.  Adjuvant radiation therapy 11/23/2020-12/19/2020 3.  Adjuvant antiestrogen  therapy with tamoxifen  started October 2022 4.  CT angiogram  07/13/2021: Right supraclavicular lymph node 5 cm, right internal mammary lymph nodes 1.8 cm 5.  Metastatic breast cancer: June 2023:Right supraclavicular lymphadenopathy: Biopsy: Metastatic poorly differentiated adenocarcinoma, ER +40%, PR negative, HER2 positive 3+ PET CT scan 08/06/21: Ext Right Supra clav LN along with Internal mammary and Rt Upper Mediastinal and Rt Axillary LN    CT chest abdomen pelvis 06/25/2022: Stable exam.  No new or progressive findings. CT CAP 10/04/2022: Stable findings without new or progressive disease CT CAP 02/22/2023: Stable findings without new or progressive disease.  Pneumonia that was noted on chest x-ray from earlier in the month was again noted. CT CAP 06/10/2023: Small supraclavicular lymph node on the left 7 mm, right axillary nodular thickening 1.5 cm overall stable disease, and remained in lung infiltrates. Echocardiogram every 4 months based upon Dr. Nelle recommendation. -----------------------------------------------------------------------------------------------------  Current treatment: Enhertu  started on 08/21/2021 now every 4 weeks Enhertu  toxicities:   Hospitalization 04/29/2023-05/04/2023: Community-acquired pneumonia Chemo-induced peripheral neuropathy Scans 06/2023 showed stable disease, tolerating well   Assessment and Plan Assessment & Plan Metastatic breast cancer No progression on recent CT scans. Echocardiogram shows LVEF 60-65%. No mammographic evidence of malignancy. - Schedule next scans before November 06, 2023, appointment with Dr. Gudena.  Bilateral knee osteoarthritis with meniscal tear Cortisone injection administered. Discussed weight loss before knee replacement. - Continue physical therapy starting next Thursday. - Consider cortisone injections every 3-4 months as needed. - Discuss potential for knee replacement after weight loss. - Continue f/u with ortho  specialist  Bilateral lower extremity edema Edema with recent weight gain, possibly due to decreased activity from knee and arm pain. On furosemide  and spironolactone . - Prescribe spironolactone . - Continue furosemide  as needed. - Avoid fluid administration during treatment.  Hypertension Elevated blood pressure possibly due to pain and anxiety. Regular home monitoring ongoing. - Continue monitoring blood pressure at home.  RTC in 4 weeks for labs, f/u, and next Enhertu     All questions were answered. The patient knows to call the clinic with any problems, questions or concerns. We can certainly see the patient much sooner if necessary.  Total encounter time:30 minutes*in face-to-face visit time, chart review, lab review, care coordination, order entry, and documentation of the encounter time.    Morna Kendall, NP 10/11/23 2:58 PM Medical Oncology and Hematology [facility] [address] [place], [place] [address] Tel. [phone]    Fax. [phone]  *Total Encounter Time as defined by the Centers for Medicare and Medicaid Services includes, in addition to the face-to-face time of a patient visit (documented in the note above) non-face-to-face time: obtaining and reviewing outside history, ordering and reviewing medications, tests or procedures, care coordination (communications with other health care professionals or caregivers) and documentation in the medical record.

## 2023-10-11 NOTE — Assessment & Plan Note (Signed)
 06/02/2020:Right lumpectomy ([facility]): invasive and in situ ductal carcinoma, 1.3cm, clear margins, 1 right axillary lymph node negative for carcinoma. ER 15% weak, PR-,HER-2 positive (3+) Ki67 80%.   Treatment plan: 1.  Adjuvant chemotherapy with Taxol  Herceptin  followed by Herceptin  maintenance.  Stopped after 11 cycles of Taxol  Herceptin  maintenance completed 07/07/2021  2.  Adjuvant radiation therapy 11/23/2020-12/19/2020 3.  Adjuvant antiestrogen therapy with tamoxifen  started October 2022 4.  CT angiogram 07/13/2021: Right supraclavicular lymph node 5 cm, right internal mammary lymph nodes 1.8 cm 5.  Metastatic breast cancer: June 2023:Right supraclavicular lymphadenopathy: Biopsy: Metastatic poorly differentiated adenocarcinoma, ER +40%, PR negative, HER2 positive 3+ PET CT scan 08/06/21: Ext Right Supra clav LN along with Internal mammary and Rt Upper Mediastinal and Rt Axillary LN    CT chest abdomen pelvis 06/25/2022: Stable exam.  No new or progressive findings. CT CAP 10/04/2022: Stable findings without new or progressive disease CT CAP 02/22/2023: Stable findings without new or progressive disease.  Pneumonia that was noted on chest x-ray from earlier in the month was again noted. CT CAP 06/10/2023: Small supraclavicular lymph node on the left 7 mm, right axillary nodular thickening 1.5 cm overall stable disease, and remained in lung infiltrates. Echocardiogram every 4 months based upon Dr. Nelle recommendation. -----------------------------------------------------------------------------------------------------  Current treatment: Enhertu  started on 08/21/2021 now every 4 weeks Enhertu  toxicities:   Hospitalization 04/29/2023-05/04/2023: Community-acquired pneumonia Chemo-induced peripheral neuropathy Scans 06/2023 showed stable disease, tolerating well   Assessment and Plan Assessment & Plan Metastatic breast cancer No progression on recent CT scans. Echocardiogram shows LVEF 60-65%. No  mammographic evidence of malignancy. - Schedule next scans before November 06, 2023, appointment with Dr. Gudena.  Bilateral knee osteoarthritis with meniscal tear Cortisone injection administered. Discussed weight loss before knee replacement. - Continue physical therapy starting next Thursday. - Consider cortisone injections every 3-4 months as needed. - Discuss potential for knee replacement after weight loss. - Continue f/u with ortho specialist  Bilateral lower extremity edema Edema with recent weight gain, possibly due to decreased activity from knee and arm pain. On furosemide  and spironolactone . - Prescribe spironolactone . - Continue furosemide  as needed. - Avoid fluid administration during treatment.  Hypertension Elevated blood pressure possibly due to pain and anxiety. Regular home monitoring ongoing. - Continue monitoring blood pressure at home.  RTC in 4 weeks for labs, f/u, and next Enhertu 

## 2023-10-23 ENCOUNTER — Ambulatory Visit (HOSPITAL_COMMUNITY)
Admission: RE | Admit: 2023-10-23 | Discharge: 2023-10-23 | Disposition: A | Discharge status: Home / Self Care | Source: Ambulatory Visit | Attending: Adult Health | Admitting: Adult Health

## 2023-10-23 DIAGNOSIS — C50511 Malignant neoplasm of lower-outer quadrant of right female breast: Secondary | ICD-10-CM | POA: Insufficient documentation

## 2023-10-23 DIAGNOSIS — Z170 Estrogen receptor positive status [ER+]: Secondary | ICD-10-CM | POA: Diagnosis present

## 2023-10-23 MED ORDER — IOHEXOL 300 MG/ML  SOLN
100.0000 mL | Freq: Once | INTRAMUSCULAR | Status: AC | PRN
  Administered 2023-10-23: 100 mL via INTRAVENOUS

## 2023-10-23 MED ORDER — HEPARIN SOD (PORK) LOCK FLUSH 100 UNIT/ML IV SOLN
INTRAVENOUS | Status: AC
  Filled 2023-10-23: qty 5

## 2023-10-23 MED ORDER — HEPARIN SOD (PORK) LOCK FLUSH 100 UNIT/ML IV SOLN
500.0000 [IU] | Freq: Once | INTRAVENOUS | Status: AC
  Administered 2023-10-23: 500 [IU] via INTRAVENOUS

## 2023-11-05 ENCOUNTER — Encounter: Admitting: Internal Medicine

## 2023-11-05 MED FILL — Fosaprepitant Dimeglumine For IV Infusion 150 MG (Base Eq): INTRAVENOUS | Qty: 5 | Status: AC

## 2023-11-06 ENCOUNTER — Inpatient Hospital Stay: Admitting: Hematology and Oncology

## 2023-11-06 ENCOUNTER — Inpatient Hospital Stay: Attending: Hematology and Oncology

## 2023-11-06 ENCOUNTER — Inpatient Hospital Stay

## 2023-11-06 VITALS — BP 150/79 | HR 78 | Temp 98.0°F | Resp 18 | Ht 60.0 in | Wt 319.9 lb

## 2023-11-06 DIAGNOSIS — Z91040 Latex allergy status: Secondary | ICD-10-CM | POA: Insufficient documentation

## 2023-11-06 DIAGNOSIS — G620 Drug-induced polyneuropathy: Secondary | ICD-10-CM | POA: Insufficient documentation

## 2023-11-06 DIAGNOSIS — C50511 Malignant neoplasm of lower-outer quadrant of right female breast: Secondary | ICD-10-CM

## 2023-11-06 DIAGNOSIS — Z8249 Family history of ischemic heart disease and other diseases of the circulatory system: Secondary | ICD-10-CM | POA: Insufficient documentation

## 2023-11-06 DIAGNOSIS — Z79899 Other long term (current) drug therapy: Secondary | ICD-10-CM | POA: Insufficient documentation

## 2023-11-06 DIAGNOSIS — Z885 Allergy status to narcotic agent status: Secondary | ICD-10-CM | POA: Insufficient documentation

## 2023-11-06 DIAGNOSIS — Z888 Allergy status to other drugs, medicaments and biological substances status: Secondary | ICD-10-CM | POA: Insufficient documentation

## 2023-11-06 DIAGNOSIS — R42 Dizziness and giddiness: Secondary | ICD-10-CM | POA: Diagnosis not present

## 2023-11-06 DIAGNOSIS — Z170 Estrogen receptor positive status [ER+]: Secondary | ICD-10-CM | POA: Diagnosis not present

## 2023-11-06 DIAGNOSIS — Z883 Allergy status to other anti-infective agents status: Secondary | ICD-10-CM | POA: Diagnosis not present

## 2023-11-06 DIAGNOSIS — Z1731 Human epidermal growth factor receptor 2 positive status: Secondary | ICD-10-CM | POA: Diagnosis not present

## 2023-11-06 DIAGNOSIS — R110 Nausea: Secondary | ICD-10-CM | POA: Diagnosis not present

## 2023-11-06 DIAGNOSIS — R609 Edema, unspecified: Secondary | ICD-10-CM | POA: Diagnosis not present

## 2023-11-06 DIAGNOSIS — D696 Thrombocytopenia, unspecified: Secondary | ICD-10-CM | POA: Diagnosis not present

## 2023-11-06 DIAGNOSIS — R17 Unspecified jaundice: Secondary | ICD-10-CM | POA: Insufficient documentation

## 2023-11-06 DIAGNOSIS — M79606 Pain in leg, unspecified: Secondary | ICD-10-CM | POA: Diagnosis not present

## 2023-11-06 DIAGNOSIS — Z833 Family history of diabetes mellitus: Secondary | ICD-10-CM | POA: Insufficient documentation

## 2023-11-06 DIAGNOSIS — M1611 Unilateral primary osteoarthritis, right hip: Secondary | ICD-10-CM | POA: Diagnosis not present

## 2023-11-06 DIAGNOSIS — I700 Atherosclerosis of aorta: Secondary | ICD-10-CM | POA: Insufficient documentation

## 2023-11-06 DIAGNOSIS — I119 Hypertensive heart disease without heart failure: Secondary | ICD-10-CM | POA: Diagnosis not present

## 2023-11-06 DIAGNOSIS — Z91013 Allergy to seafood: Secondary | ICD-10-CM | POA: Insufficient documentation

## 2023-11-06 DIAGNOSIS — Z1722 Progesterone receptor negative status: Secondary | ICD-10-CM | POA: Diagnosis not present

## 2023-11-06 DIAGNOSIS — Z95828 Presence of other vascular implants and grafts: Secondary | ICD-10-CM

## 2023-11-06 DIAGNOSIS — K5903 Drug induced constipation: Secondary | ICD-10-CM | POA: Diagnosis not present

## 2023-11-06 DIAGNOSIS — Z5112 Encounter for antineoplastic immunotherapy: Secondary | ICD-10-CM | POA: Insufficient documentation

## 2023-11-06 DIAGNOSIS — Z7982 Long term (current) use of aspirin: Secondary | ICD-10-CM | POA: Insufficient documentation

## 2023-11-06 DIAGNOSIS — Z8349 Family history of other endocrine, nutritional and metabolic diseases: Secondary | ICD-10-CM | POA: Insufficient documentation

## 2023-11-06 DIAGNOSIS — T402X5A Adverse effect of other opioids, initial encounter: Secondary | ICD-10-CM | POA: Insufficient documentation

## 2023-11-06 DIAGNOSIS — N6011 Diffuse cystic mastopathy of right breast: Secondary | ICD-10-CM | POA: Diagnosis not present

## 2023-11-06 DIAGNOSIS — Z923 Personal history of irradiation: Secondary | ICD-10-CM | POA: Insufficient documentation

## 2023-11-06 DIAGNOSIS — C50919 Malignant neoplasm of unspecified site of unspecified female breast: Secondary | ICD-10-CM

## 2023-11-06 DIAGNOSIS — T451X5A Adverse effect of antineoplastic and immunosuppressive drugs, initial encounter: Secondary | ICD-10-CM | POA: Insufficient documentation

## 2023-11-06 DIAGNOSIS — Z803 Family history of malignant neoplasm of breast: Secondary | ICD-10-CM | POA: Insufficient documentation

## 2023-11-06 DIAGNOSIS — Z83438 Family history of other disorder of lipoprotein metabolism and other lipidemia: Secondary | ICD-10-CM | POA: Insufficient documentation

## 2023-11-06 DIAGNOSIS — Z801 Family history of malignant neoplasm of trachea, bronchus and lung: Secondary | ICD-10-CM | POA: Insufficient documentation

## 2023-11-06 LAB — CBC WITH DIFFERENTIAL (CANCER CENTER ONLY)
Abs Immature Granulocytes: 0.01 K/uL (ref 0.00–0.07)
Basophils Absolute: 0.1 K/uL (ref 0.0–0.1)
Basophils Relative: 1 %
Eosinophils Absolute: 0.2 K/uL (ref 0.0–0.5)
Eosinophils Relative: 5 %
HCT: 38.1 % (ref 36.0–46.0)
Hemoglobin: 12.7 g/dL (ref 12.0–15.0)
Immature Granulocytes: 0 %
Lymphocytes Relative: 27 %
Lymphs Abs: 1 K/uL (ref 0.7–4.0)
MCH: 28.2 pg (ref 26.0–34.0)
MCHC: 33.3 g/dL (ref 30.0–36.0)
MCV: 84.5 fL (ref 80.0–100.0)
Monocytes Absolute: 0.7 K/uL (ref 0.1–1.0)
Monocytes Relative: 18 %
Neutro Abs: 1.8 K/uL (ref 1.7–7.7)
Neutrophils Relative %: 49 %
Platelet Count: 191 K/uL (ref 150–400)
RBC: 4.51 MIL/uL (ref 3.87–5.11)
RDW: 16.5 % — ABNORMAL HIGH (ref 11.5–15.5)
WBC Count: 3.8 K/uL — ABNORMAL LOW (ref 4.0–10.5)
nRBC: 0 % (ref 0.0–0.2)

## 2023-11-06 LAB — CMP (CANCER CENTER ONLY)
ALT: 26 U/L (ref 0–44)
AST: 43 U/L — ABNORMAL HIGH (ref 15–41)
Albumin: 3.4 g/dL — ABNORMAL LOW (ref 3.5–5.0)
Alkaline Phosphatase: 100 U/L (ref 38–126)
Anion gap: 6 (ref 5–15)
BUN: 8 mg/dL (ref 6–20)
CO2: 29 mmol/L (ref 22–32)
Calcium: 9 mg/dL (ref 8.9–10.3)
Chloride: 106 mmol/L (ref 98–111)
Creatinine: 0.64 mg/dL (ref 0.44–1.00)
GFR, Estimated: >60 mL/min (ref >60)
Glucose, Bld: 115 mg/dL — ABNORMAL HIGH (ref 70–99)
Potassium: 3.6 mmol/L (ref 3.5–5.1)
Sodium: 141 mmol/L (ref 135–145)
Total Bilirubin: 1.9 mg/dL — ABNORMAL HIGH (ref 0.0–1.2)
Total Protein: 6.6 g/dL (ref 6.5–8.1)

## 2023-11-06 MED ORDER — SODIUM CHLORIDE 0.9 % IV SOLN
150.0000 mg | Freq: Once | INTRAVENOUS | Status: AC
  Administered 2023-11-06: 150 mg via INTRAVENOUS
  Filled 2023-11-06: qty 150

## 2023-11-06 MED ORDER — FAM-TRASTUZUMAB DERUXTECAN-NXKI CHEMO 100 MG IV SOLR
300.0000 mg | Freq: Once | INTRAVENOUS | Status: AC
  Administered 2023-11-06: 300 mg via INTRAVENOUS
  Filled 2023-11-06: qty 15

## 2023-11-06 MED ORDER — PALONOSETRON HCL INJECTION 0.25 MG/5ML
0.2500 mg | Freq: Once | INTRAVENOUS | Status: AC
  Administered 2023-11-06: 0.25 mg via INTRAVENOUS
  Filled 2023-11-06: qty 5

## 2023-11-06 MED ORDER — DEXTROSE 5 % IV SOLN: Freq: Once | INTRAVENOUS | Status: AC

## 2023-11-06 MED ORDER — POTASSIUM CHLORIDE CRYS ER 10 MEQ PO TBCR
20.0000 meq | EXTENDED_RELEASE_TABLET | Freq: Two times a day (BID) | ORAL | 6 refills | Status: AC
Start: 2023-11-06 — End: ?

## 2023-11-06 MED ORDER — CYANOCOBALAMIN 1000 MCG/ML IJ SOLN
1000.0000 ug | Freq: Once | INTRAMUSCULAR | Status: AC
  Administered 2023-11-06: 1000 ug via INTRAMUSCULAR

## 2023-11-06 MED ORDER — ACETAMINOPHEN 325 MG PO TABS
650.0000 mg | ORAL_TABLET | Freq: Once | ORAL | Status: AC
  Administered 2023-11-06: 650 mg via ORAL
  Filled 2023-11-06: qty 2

## 2023-11-06 NOTE — Patient Instructions (Signed)
 [facility] - A DEPT OF [facility]  Discharge Instructions: Thank you for choosing [facility] to provide your oncology and hematology care.   If you have a lab appointment with the Cancer Center, please go directly to the Cancer Center and check in at the registration area.   Wear comfortable clothing and clothing appropriate for easy access to any Portacath or PICC line.   We strive to give you quality time with your provider. You may need to reschedule your appointment if you arrive late (15 or more minutes).  Arriving late affects you and other patients whose appointments are after yours.  Also, if you miss three or more appointments without notifying the office, you may be dismissed from the clinic at the provider's discretion.      For prescription refill requests, have your pharmacy contact our office and allow 72 hours for refills to be completed.    Today you received the following chemotherapy and/or immunotherapy agents Enhertu      To help prevent nausea and vomiting after your treatment, we encourage you to take your nausea medication as directed.  BELOW ARE SYMPTOMS THAT SHOULD BE REPORTED IMMEDIATELY: *FEVER GREATER THAN 100.4 F (38 C) OR HIGHER *CHILLS OR SWEATING *NAUSEA AND VOMITING THAT IS NOT CONTROLLED WITH YOUR NAUSEA MEDICATION *UNUSUAL SHORTNESS OF BREATH *UNUSUAL BRUISING OR BLEEDING *URINARY PROBLEMS (pain or burning when urinating, or frequent urination) *BOWEL PROBLEMS (unusual diarrhea, constipation, pain near the anus) TENDERNESS IN MOUTH AND THROAT WITH OR WITHOUT PRESENCE OF ULCERS (sore throat, sores in mouth, or a toothache) UNUSUAL RASH, SWELLING OR PAIN  UNUSUAL VAGINAL DISCHARGE OR ITCHING   Items with * indicate a potential emergency and should be followed up as soon as possible or go to the Emergency Department if any problems should occur.  Please show the CHEMOTHERAPY ALERT CARD or IMMUNOTHERAPY  ALERT CARD at check-in to the Emergency Department and triage nurse.  Should you have questions after your visit or need to cancel or reschedule your appointment, please contact [facility] - A DEPT OF [facility]  Dept: [phone]  and follow the prompts.  Office hours are 8:00 a.m. to 4:30 p.m. Monday - Friday. Please note that voicemails left after 4:00 p.m. may not be returned until the following business day.  We are closed weekends and major holidays. You have access to a nurse at all times for urgent questions. Please call the main number to the clinic Dept: [phone] and follow the prompts.   For any non-urgent questions, you may also contact your provider using MyChart. We now offer e-Visits for anyone 19 and older to request care online for non-urgent symptoms. For details visit [email or web address].   Also download the MyChart app! Go to the app store, search "MyChart", open the app, select [facility], and log in with your MyChart username and password.

## 2023-11-06 NOTE — Progress Notes (Signed)
 Patient Care Team: [name], MD as PCP - General (Internal Medicine) Vernetta Lonni GRADE, MD as Consulting Physician (Orthopedic Surgery) Glean Stephane BROCKS, RN (Inactive) as Oncology Nurse Navigator Tyree Nanetta SAILOR, RN as Oncology Nurse Navigator Vanderbilt Ned, MD as Consulting Physician (General Surgery) Odean Potts, MD as Consulting Physician (Hematology and Oncology) Izell Domino, MD as Attending Physician (Radiation Oncology)  DIAGNOSIS:  Encounter Diagnosis  Name Primary?   Malignant neoplasm of lower-outer quadrant of right breast of female, estrogen receptor positive (HCC) Yes    SUMMARY OF ONCOLOGIC HISTORY: Oncology History  Malignant neoplasm of lower-outer quadrant of right breast of female, estrogen receptor positive (HCC)  05/03/2020 Initial Diagnosis   Screening mammogram showed a 1.0cm mass at the 6 o'clock position in the right breast. Diagnostic mammogram and US  showed the 1.2cm mass at the 6 o'clock position in the right breast. Biopsy showed IDC, grade 3, HER-2 positive (3+), ER 15% weak, PR-, Ki67 80%.   05/19/2020 Genetic Testing   Negative hereditary cancer genetic testing: no pathogenic variants detected in [facility] Breast STAT Panel and Common Hereditary Cancers Panel.  The report dates are May 19, 2020 (STAT) and May 23, 2020 (Common Hereditary).   The Common Hereditary Cancers Panel offered by [facility] includes sequencing and/or deletion duplication testing of the following 47 genes: APC, ATM, AXIN2, BARD1, BMPR1A, BRCA1, BRCA2, BRIP1, CDH1, CDK4, CDKN2A (p14ARF), CDKN2A (p16INK4a), CHEK2, CTNNA1, DICER1, EPCAM (Deletion/duplication testing only), GREM1 (promoter region deletion/duplication testing only), GREM1, HOXB13, KIT, MEN1, MLH1, MSH2, MSH3, MSH6, MUTYH, NBN, NF1, NHTL1, PALB2, PDGFRA, PMS2, POLD1, POLE, PTEN, RAD50, RAD51C, RAD51D, SDHA, SDHB, SDHC, SDHD, SMAD4, SMARCA4. STK11, TP53, TSC1, TSC2, and VHL.  The following genes were evaluated  for sequence changes only: SDHA and HOXB13 c.251G>A variant only.es only: SDHA and HOXB13 c.251G>A variant only.   06/02/2020 Surgery   Right lumpectomy ([facility]): invasive and in situ ductal carcinoma, 1.3cm, clear margins, 1 right axillary lymph node negative for carcinoma.   06/02/2020 Cancer Staging   Staging form: Breast, AJCC 8th Edition - Pathologic stage from 06/02/2020: Stage IA (pT1c, pN0, cM0, G3, ER+, PR-, HER2+) - Signed by Crawford Morna Pickle, NP on 06/15/2020 Stage prefix: Initial diagnosis Histologic grading system: 3 grade system   07/08/2020 - 10/21/2020 Chemotherapy   Taxol  Herceptin  followed by Herceptin  maintenance   04/14/2021 -  Anti-estrogen oral therapy   Tamoxifen  10 mg   07/13/2021 Imaging   CT angiogram: Right supraclavicular lymph node 5 cm, right internal mammary lymph nodes 1.8 cm    08/06/2021 PET scan   PET CT scan: Ext Right Supra clav LN along with Internal mammary and Rt Upper Mediastinal and Rt Axillary LN   08/2021 Initial Biopsy   Right supraclavicular lymphadenopathy: Biopsy: Metastatic poorly differentiated adenocarcinoma, ER +40%, PR negative, HER2 positive 3+    08/21/2021 -  Chemotherapy   Enhertu  every 3 weeks    03/08/2022 Imaging   CT chest/abdomen/pelvis  1. No new or progressive findings in the chest, abdomen or pelvis. 2. Signs of RIGHT breast lumpectomy with areas of central fat necrosis unchanged from previous imaging. 3. Stable small lymph nodes throughout the chest, largest with area of soft tissue about surgical clips in the RIGHT axilla. 4. Stable appearance of thoracic inlet lymph nodes, not shown to be hypermetabolic on prior PET imaging. 5. IUD in-situ. 6. Aortic atherosclerosis.   08/21/2022 Imaging   Echocardiogram  -normal LVEF at 60-65%. -no left ventricular wall motion abnormalities  -normal left ventricular diastolic function  -  mild left ventricular hypertrophy.    10/05/2022 Imaging   CT Chest, Abdomen, and Pelvis  with contrast IMPRESSION: 1. Stable examination without new or progressive finding in the chest, abdomen or pelvis to suggest recurrent or metastatic disease. 2. Stable tiny bilateral thoracic inlet lymph nodes. 3.  Aortic Atherosclerosis (ICD10-I70.0).     CHIEF COMPLIANT: Follow-up on Enhertu   HISTORY OF PRESENT ILLNESS:   History of Present Illness Pamela [name] is a 58 year old female with metastatic disease who presents for follow-up on her treatment regimen.  She has been feeling generally well with no significant pain related to her metastatic disease. She experiences some nausea, which she manages with ginger tea without the need for medication. She has completed approximately 37 to 38 treatments. Recent scans show no concerning findings, with previously affected lymph nodes appearing resolved.  She reports leg pain due to a torn meniscus from a fall at work. She is scheduled for a gel injection for her knee, and a previous cortisone shot did not alleviate her symptoms.  Her blood pressure was recently high at 190/98, causing lightheadedness and swelling in her hands and legs. A diuretic helped reduce the swelling. Her blood pressure today is 150/79.  Recent lab results show a bilirubin level of 1.9, slightly elevated from previous levels, and an AST level of 43, consistent with past results. She is on her last bottle of potassium, taking two tablets daily.     ALLERGIES:  is allergic to pollen extract, compazine  [prochlorperazine ], latex, codeine, diflucan  [fluconazole ], shellfish allergy, and betadine [povidone iodine].  MEDICATIONS:  Current Outpatient Medications  Medication Sig Dispense Refill   acetaminophen  (TYLENOL ) 500 MG tablet Take 1 tablet (500 mg total) by mouth every 6 (six) hours as needed. 30 tablet 0   albuterol  (VENTOLIN  HFA) 108 (90 Base) MCG/ACT inhaler Inhale 2 puffs into the lungs every 6 (six) hours as needed for wheezing or shortness of breath. 8 g  2   aspirin  EC 81 MG tablet Take 1 tablet (81 mg total) by mouth daily. Swallow whole. 30 tablet 11   escitalopram (LEXAPRO) 10 MG tablet Take 10 mg by mouth daily. Take one tablet by mouth once daily.     furosemide  (LASIX ) 20 MG tablet Take 1 tablet (20 mg total) by mouth as needed. TAKE 1 TABLET AS NEEDED FOR SWELLING OR SHORTNESS OF BREATH OR WEIGHT GAIN. 3 POUNDS WITHIN 24HR OR 5 POUNDS 7 DAYS 90 tablet 0   HYDROcodone -acetaminophen  (NORCO/VICODIN) 5-325 MG tablet Take 1 tablet by mouth.     levonorgestrel  (MIRENA , 52 MG,) 20 MCG/DAY IUD 1 each by Intrauterine route once.     lidocaine -prilocaine  (EMLA ) cream Apply to affected area once AS DIRECTED 30 g 3   LORazepam  (ATIVAN ) 0.5 MG tablet Take 0.5 mg by mouth daily. Take one tablet by mouth once daily as needed.     rosuvastatin  (CRESTOR ) 40 MG tablet Take 1 tablet (40 mg total) by mouth daily. 90 tablet 3   Spacer/Aero-Holding Chambers (AEROCHAMBER PLUS) Device Please dispense spacer for inhaler use 1 each 0   spironolactone  (ALDACTONE ) 25 MG tablet Take 1 tablet (25 mg total) by mouth daily. 30 tablet 6   valsartan  (DIOVAN ) 160 MG tablet Take 1 tablet (160 mg total) by mouth daily. 30 tablet 11   venlafaxine  XR (EFFEXOR -XR) 37.5 MG 24 hr capsule Take 37.5 mg by mouth every morning.     potassium chloride  (KLOR-CON  M) 10 MEQ tablet Take 2 tablets (20 mEq total)  by mouth 2 (two) times daily. 60 tablet 6   No current facility-administered medications for this visit.   Facility-Administered Medications Ordered in Other Visits  Medication Dose Route Frequency Provider Last Rate Last Admin   heparin  lock flush 100 unit/mL  500 Units Intracatheter Once Odean Potts, MD       sodium chloride  flush (NS) 0.9 % injection 10 mL  10 mL Intracatheter Once Odean Potts, MD        PHYSICAL EXAMINATION: ECOG PERFORMANCE STATUS: 1 - Symptomatic but completely ambulatory  Vitals:   11/06/23 1005  BP: (!) 150/79  Pulse: 78  Resp: 18  Temp: 98 F  (36.7 C)  SpO2: 100%   Filed Weights   11/06/23 1005  Weight: (!) 319 lb 14.4 oz (145.1 kg)    Physical Exam VITALS: BP- 150/79  (exam performed in the presence of a chaperone)  LABORATORY DATA:  I have reviewed the data as listed    Latest Ref Rng & Units 11/06/2023    9:39 AM 10/09/2023   10:42 AM 09/11/2023    9:34 AM  CMP  Glucose 70 - 99 mg/dL 884  862  99   BUN 6 - 20 mg/dL 8  7  9    Creatinine 0.44 - 1.00 mg/dL 9.35  9.25  9.34   Sodium 135 - 145 mmol/L 141  141  139   Potassium 3.5 - 5.1 mmol/L 3.6  3.4  4.1   Chloride 98 - 111 mmol/L 106  106  107   CO2 22 - 32 mmol/L 29  32  28   Calcium  8.9 - 10.3 mg/dL 9.0  8.8  9.3   Total Protein 6.5 - 8.1 g/dL 6.6  6.4  6.5   Total Bilirubin 0.0 - 1.2 mg/dL 1.9  1.4  1.4   Alkaline Phos 38 - 126 U/L 100  130  114   AST 15 - 41 U/L 43  43  41   ALT 0 - 44 U/L 26  37  29     Lab Results  Component Value Date   WBC 3.8 (L) 11/06/2023   HGB 12.7 11/06/2023   HCT 38.1 11/06/2023   MCV 84.5 11/06/2023   PLT 191 11/06/2023   NEUTROABS 1.8 11/06/2023    ASSESSMENT & PLAN:  Malignant neoplasm of lower-outer quadrant of right breast of female, estrogen receptor positive (HCC) 06/02/2020:Right lumpectomy ([facility]): invasive and in situ ductal carcinoma, 1.3cm, clear margins, 1 right axillary lymph node negative for carcinoma. ER 15% weak, PR-,HER-2 positive (3+) Ki67 80%.   Treatment plan: 1.  Adjuvant chemotherapy with Taxol  Herceptin  followed by Herceptin  maintenance.  Stopped after 11 cycles of Taxol  Herceptin  maintenance completed 07/07/2021  2.  Adjuvant radiation therapy 11/23/2020-12/19/2020 3.  Adjuvant antiestrogen therapy with tamoxifen  started October 2022 4.  CT angiogram 07/13/2021: Right supraclavicular lymph node 5 cm, right internal mammary lymph nodes 1.8 cm 5.  Metastatic breast cancer: June 2023:Right supraclavicular lymphadenopathy: Biopsy: Metastatic poorly differentiated adenocarcinoma, ER +40%, PR negative, HER2  positive 3+ PET CT scan 08/06/21: Ext Right Supra clav LN along with Internal mammary and Rt Upper Mediastinal and Rt Axillary LN  CT CAP 06/10/2023: Small supraclavicular lymph node on the left 7 mm, right axillary nodular thickening 1.5 cm overall stable disease, and remained in lung infiltrates. Echocardiogram based upon Dr. Nelle recommendation. -----------------------------------------------------------------------------------------------------  Current treatment: Enhertu  started on 08/21/2021 now every 4 weeks, cycle 34, recommended moving her treatment to every 5 weeks Enhertu  toxicities:  Hospitalization 04/29/2023-05/04/2023: Community-acquired pneumonia Chemo-induced peripheral neuropathy  CT CAP 10/28/23: No evidence of lymphadenopathy or metastatic disease ------------------------------------- Assessment and Plan Assessment & Plan Malignant neoplasm of lower-outer quadrant of right breast, ER+ Scans showed no concerning findings; mediastinal and supraclavicular lymph nodes resolved. Completed 37-38 treatments. She felt good overall, without significant pain or nausea. - Spread treatment intervals to every five weeks. - Continue regular scans and follow-ups with Dr. Bettyjane.  Hypertension Reported severe hypertension episode with lightheadedness and swelling. Blood pressure decreased from 190/98 to 135, current reading 150/79, indicating fluctuations.  Edema Swelling in hands and legs associated with hypertension.  Elevated bilirubin Bilirubin elevated at 1.9, up from 1.4 and 1.7, likely due to current drug regimen. - Reduce the frequency of treatment to every five weeks.      No orders of the defined types were placed in this encounter.  The patient has a good understanding of the overall plan. she agrees with it. she will call with any problems that may develop before the next visit here. Total time spent: 30 mins including face to face time and time spent for  planning, charting and co-ordination of care   Viinay K Suleman Gunning, MD 11/06/23

## 2023-11-06 NOTE — Assessment & Plan Note (Signed)
 06/02/2020:Right lumpectomy ([facility]): invasive and in situ ductal carcinoma, 1.3cm, clear margins, 1 right axillary lymph node negative for carcinoma. ER 15% weak, PR-,HER-2 positive (3+) Ki67 80%.   Treatment plan: 1.  Adjuvant chemotherapy with Taxol  Herceptin  followed by Herceptin  maintenance.  Stopped after 11 cycles of Taxol  Herceptin  maintenance completed 07/07/2021  2.  Adjuvant radiation therapy 11/23/2020-12/19/2020 3.  Adjuvant antiestrogen therapy with tamoxifen  started October 2022 4.  CT angiogram 07/13/2021: Right supraclavicular lymph node 5 cm, right internal mammary lymph nodes 1.8 cm 5.  Metastatic breast cancer: June 2023:Right supraclavicular lymphadenopathy: Biopsy: Metastatic poorly differentiated adenocarcinoma, ER +40%, PR negative, HER2 positive 3+ PET CT scan 08/06/21: Ext Right Supra clav LN along with Internal mammary and Rt Upper Mediastinal and Rt Axillary LN  CT CAP 06/10/2023: Small supraclavicular lymph node on the left 7 mm, right axillary nodular thickening 1.5 cm overall stable disease, and remained in lung infiltrates. Echocardiogram based upon Dr. Nelle recommendation. -----------------------------------------------------------------------------------------------------  Current treatment: Enhertu  started on 08/21/2021 now every 4 weeks, cycle 34 Enhertu  toxicities:   Hospitalization 04/29/2023-05/04/2023: Community-acquired pneumonia Chemo-induced peripheral neuropathy  CT CAP 10/28/23: No evidence of lymphadenopathy or metastatic disease

## 2023-11-25 ENCOUNTER — Telehealth: Payer: Self-pay | Admitting: *Deleted

## 2023-11-25 NOTE — Telephone Encounter (Signed)
 Patient called concerned about symptoms.  States that she after her last Enhertu  treatment on 11/06/2023 she experienced constipation X 2 weeks.  During that time she also had to take some Hydrocodone  for pain mgmt for her knees.  She eventually had a bowel movement but after that she experienced rectal bleeding X 4 days (only while going to the bathroom) that has since resolved.  She stated that it was so heavy that she thought she was having a cycle.  Severe nausea/vomiting and right breast tenderness and some dizziness.  Elevated blood pressure.     Per Dr Odean schedule follow up with Morna for symptom management.

## 2023-11-26 ENCOUNTER — Ambulatory Visit: Payer: Self-pay

## 2023-11-26 NOTE — Telephone Encounter (Signed)
 FYI Only or Action Required?: FYI only for provider.  Patient was last seen in primary care on 07/22/2023 by [name], MD.  Called Nurse Triage reporting Medication Management.  Symptoms began several weeks ago.  Interventions attempted: Nothing.  Symptoms are: unchanged.  Triage Disposition: See PCP When Office is Open (Within 3 Days)  Patient/caregiver understands and will follow disposition?: Yes, will follow disposition  Copied from CRM #[id number]. Topic: Clinical - Red Word Triage >> Nov 26, 2023  4:03 PM Wess RAMAN wrote: Red Word that prompted transfer to Nurse Triage: Patient fell at work in June and needs a total knee replacement. They can't do it until she loses weight. She would like to get on weight loss medication to assist with it. Patient also mention she has been in severe pain. Reason for Disposition  Prescription request for new medicine (not a refill)  Answer Assessment - Initial Assessment Questions 1. NAME of MEDICINE: What medicine(s) are you calling about?     Weight loss medication 2. QUESTION: What is your question? (e.g., double dose of medicine, side effect)     Would like to start a weight loss medication, states has been denied by insurance in the past but states that she would like her PCP to attempt again with the insurance as she is now needing surgery.  Protocols used: Medication Question Call-A-AH

## 2023-11-27 ENCOUNTER — Telehealth: Payer: Self-pay | Admitting: *Deleted

## 2023-11-27 ENCOUNTER — Inpatient Hospital Stay: Admitting: Adult Health

## 2023-11-27 ENCOUNTER — Encounter: Payer: Self-pay | Admitting: Hematology and Oncology

## 2023-11-27 ENCOUNTER — Inpatient Hospital Stay: Admitting: Licensed Clinical Social Worker

## 2023-11-27 NOTE — Progress Notes (Signed)
 [facility] CSW Progress Note  Clinical Social Worker contacted patient by phone to follow-up on disability concerns. Pt is having more issues physically after an injury at work. Now dealing with significant knee issues and accompanying difficulties with accommodations and leave from her job. Combined with metastatic cancer, she is interested in applying for disability.      Interventions: Provided patient with information about disability process and referral to [facility]  Discussed potential to re-apply for financial resources when eligible    Follow Up Plan:  Patient will sign ROI for [facility] tomorrow     ROSALINE [name] IDE, LCSW Clinical Social Worker [facility]

## 2023-11-28 ENCOUNTER — Inpatient Hospital Stay (HOSPITAL_BASED_OUTPATIENT_CLINIC_OR_DEPARTMENT_OTHER): Admitting: Adult Health

## 2023-11-28 ENCOUNTER — Encounter: Payer: Self-pay | Admitting: Adult Health

## 2023-11-28 VITALS — BP 185/90 | HR 88 | Temp 98.2°F | Resp 18 | Wt 321.8 lb

## 2023-11-28 DIAGNOSIS — Z170 Estrogen receptor positive status [ER+]: Secondary | ICD-10-CM

## 2023-11-28 DIAGNOSIS — C50511 Malignant neoplasm of lower-outer quadrant of right female breast: Secondary | ICD-10-CM

## 2023-11-28 DIAGNOSIS — Z5112 Encounter for antineoplastic immunotherapy: Secondary | ICD-10-CM | POA: Diagnosis not present

## 2023-11-28 LAB — CMP (CANCER CENTER ONLY)
ALT: 21 U/L (ref 0–44)
AST: 39 U/L (ref 15–41)
Albumin: 3.5 g/dL (ref 3.5–5.0)
Alkaline Phosphatase: 110 U/L (ref 38–126)
Anion gap: 5 (ref 5–15)
BUN: 5 mg/dL — ABNORMAL LOW (ref 6–20)
CO2: 29 mmol/L (ref 22–32)
Calcium: 9 mg/dL (ref 8.9–10.3)
Chloride: 107 mmol/L (ref 98–111)
Creatinine: 0.7 mg/dL (ref 0.44–1.00)
GFR, Estimated: >60 mL/min (ref >60)
Glucose, Bld: 95 mg/dL (ref 70–99)
Potassium: 3.4 mmol/L — ABNORMAL LOW (ref 3.5–5.1)
Sodium: 141 mmol/L (ref 135–145)
Total Bilirubin: 2.4 mg/dL — ABNORMAL HIGH (ref 0.0–1.2)
Total Protein: 6.5 g/dL (ref 6.5–8.1)

## 2023-11-28 LAB — CBC WITH DIFFERENTIAL (CANCER CENTER ONLY)
Abs Immature Granulocytes: 0.01 K/uL (ref 0.00–0.07)
Basophils Absolute: 0 K/uL (ref 0.0–0.1)
Basophils Relative: 1 %
Eosinophils Absolute: 0.2 K/uL (ref 0.0–0.5)
Eosinophils Relative: 4 %
HCT: 37.5 % (ref 36.0–46.0)
Hemoglobin: 12.3 g/dL (ref 12.0–15.0)
Immature Granulocytes: 0 %
Lymphocytes Relative: 22 %
Lymphs Abs: 0.8 K/uL (ref 0.7–4.0)
MCH: 27.9 pg (ref 26.0–34.0)
MCHC: 32.8 g/dL (ref 30.0–36.0)
MCV: 85 fL (ref 80.0–100.0)
Monocytes Absolute: 0.5 K/uL (ref 0.1–1.0)
Monocytes Relative: 15 %
Neutro Abs: 2 K/uL (ref 1.7–7.7)
Neutrophils Relative %: 58 %
Platelet Count: 170 K/uL (ref 150–400)
RBC: 4.41 MIL/uL (ref 3.87–5.11)
RDW: 16.6 % — ABNORMAL HIGH (ref 11.5–15.5)
WBC Count: 3.5 K/uL — ABNORMAL LOW (ref 4.0–10.5)
nRBC: 0 % (ref 0.0–0.2)

## 2023-11-28 LAB — MAGNESIUM: Magnesium: 1.6 mg/dL — ABNORMAL LOW (ref 1.7–2.4)

## 2023-11-28 NOTE — Progress Notes (Signed)
 [facility] Cancer Follow up:    Pamela Medici, MD [address] [place] [address] [place] [address]   DIAGNOSIS: Cancer Staging  Malignant neoplasm of lower-outer quadrant of right breast of female, estrogen receptor positive (HCC) Staging form: Breast, AJCC 8th Edition - Clinical stage from 05/11/2020: Stage IA (cT1b, cN0, cM0, G3, ER+, PR-, HER2+) - Signed by Odean Potts, MD on 05/11/2020 Stage prefix: Initial diagnosis - Pathologic stage from 06/02/2020: Stage IA (pT1c, pN0, cM0, G3, ER+, PR-, HER2+) - Signed by Crawford Morna Pickle, NP on 06/15/2020 Stage prefix: Initial diagnosis Histologic grading system: 3 grade system    SUMMARY OF ONCOLOGIC HISTORY: Oncology History  Malignant neoplasm of lower-outer quadrant of right breast of female, estrogen receptor positive (HCC)  05/03/2020 Initial Diagnosis   Screening mammogram showed a 1.0cm mass at the 6 o'clock position in the right breast. Diagnostic mammogram and US  showed the 1.2cm mass at the 6 o'clock position in the right breast. Biopsy showed IDC, grade 3, HER-2 positive (3+), ER 15% weak, PR-, Ki67 80%.   05/19/2020 Genetic Testing   Negative hereditary cancer genetic testing: no pathogenic variants detected in [facility] Breast STAT Panel and Common Hereditary Cancers Panel.  The report dates are May 19, 2020 (STAT) and May 23, 2020 (Common Hereditary).   The Common Hereditary Cancers Panel offered by [facility] includes sequencing and/or deletion duplication testing of the following 47 genes: APC, ATM, AXIN2, BARD1, BMPR1A, BRCA1, BRCA2, BRIP1, CDH1, CDK4, CDKN2A (p14ARF), CDKN2A (p16INK4a), CHEK2, CTNNA1, DICER1, EPCAM (Deletion/duplication testing only), GREM1 (promoter region deletion/duplication testing only), GREM1, HOXB13, KIT, MEN1, MLH1, MSH2, MSH3, MSH6, MUTYH, NBN, NF1, NHTL1, PALB2, PDGFRA, PMS2, POLD1, POLE, PTEN, RAD50, RAD51C, RAD51D, SDHA, SDHB, SDHC, SDHD, SMAD4, SMARCA4. STK11, TP53, TSC1, TSC2, and  VHL.  The following genes were evaluated for sequence changes only: SDHA and HOXB13 c.251G>A variant only.es only: SDHA and HOXB13 c.251G>A variant only.   06/02/2020 Surgery   Right lumpectomy ([facility]): invasive and in situ ductal carcinoma, 1.3cm, clear margins, 1 right axillary lymph node negative for carcinoma.   06/02/2020 Cancer Staging   Staging form: Breast, AJCC 8th Edition - Pathologic stage from 06/02/2020: Stage IA (pT1c, pN0, cM0, G3, ER+, PR-, HER2+) - Signed by Crawford Morna Pickle, NP on 06/15/2020 Stage prefix: Initial diagnosis Histologic grading system: 3 grade system   07/08/2020 - 10/21/2020 Chemotherapy   Taxol  Herceptin  followed by Herceptin  maintenance   04/14/2021 -  Anti-estrogen oral therapy   Tamoxifen  10 mg   07/13/2021 Imaging   CT angiogram: Right supraclavicular lymph node 5 cm, right internal mammary lymph nodes 1.8 cm    08/06/2021 PET scan   PET CT scan: Ext Right Supra clav LN along with Internal mammary and Rt Upper Mediastinal and Rt Axillary LN   08/2021 Initial Biopsy   Right supraclavicular lymphadenopathy: Biopsy: Metastatic poorly differentiated adenocarcinoma, ER +40%, PR negative, HER2 positive 3+    08/21/2021 -  Chemotherapy   Enhertu  every 3 weeks    03/08/2022 Imaging   CT chest/abdomen/pelvis  1. No new or progressive findings in the chest, abdomen or pelvis. 2. Signs of RIGHT breast lumpectomy with areas of central fat necrosis unchanged from previous imaging. 3. Stable small lymph nodes throughout the chest, largest with area of soft tissue about surgical clips in the RIGHT axilla. 4. Stable appearance of thoracic inlet lymph nodes, not shown to be hypermetabolic on prior PET imaging. 5. IUD in-situ. 6. Aortic atherosclerosis.   08/21/2022 Imaging   Echocardiogram  -  normal LVEF at 60-65%. -no left ventricular wall motion abnormalities  -normal left ventricular diastolic function  -mild left ventricular hypertrophy.    10/05/2022  Imaging   CT Chest, Abdomen, and Pelvis with contrast IMPRESSION: 1. Stable examination without new or progressive finding in the chest, abdomen or pelvis to suggest recurrent or metastatic disease. 2. Stable tiny bilateral thoracic inlet lymph nodes. 3.  Aortic Atherosclerosis (ICD10-I70.0).     CURRENT THERAPY:  INTERVAL HISTORY:  Discussed the use of AI scribe software for clinical note transcription with the patient, who gave verbal consent to proceed.  History of Present Illness Pamela [name] is a 58 year old female who presents with knee pain and complications from treatment.  She is currently on Enhertu  every four weeks, with her next treatment scheduled for October 8th. She has experienced fluctuations in weight and fluid balance, noting a decrease from 327 to 321 pounds since the beginning of August.  She experiences nausea, dizziness, and constipation, which she attributes to hydrocodone  and ongoing treatment. Severe constipation has persisted for almost two weeks, requiring Miralax . She also experiences dry heaves, sore throat, and hoarseness, along with feelings of imbalance and dizziness.  Her blood pressure remains high despite monitoring, and she is scheduled to see her primary care provider for further evaluation. She is concerned that her pain may be contributing to her elevated blood pressure.  She has been out of work since September 3rd due to her symptoms and is dealing with work-related stress regarding her FMLA paperwork.     Patient Active Problem List   Diagnosis Date Noted   Adjustment disorder with mixed anxiety and depressed mood 07/27/2023   Chemotherapy induced nausea and vomiting 07/22/2023   Encounter for annual health examination 07/22/2023   Hypertensive heart disease without heart failure 05/09/2023   Transaminitis 05/09/2023   Hypophosphatemia 04/30/2023   Thrombocytopenia 04/30/2023   Elevated troponin 04/30/2023   Normocytic anemia  04/30/2023   Grade I diastolic dysfunction 04/30/2023   Prolonged QT interval 04/30/2023   Drug-induced constipation 11/24/2022   Chemotherapy induced cardiomyopathy 05/08/2022   Aortic atherosclerosis 04/26/2022   Hypomagnesemia 11/26/2021   Hypokalemia 11/26/2021   Dizziness, fatigue, globus sensation 11/26/2021   Breast cancer (HCC) 08/25/2021   Nausea without vomiting 08/25/2021   Prediabetes 03/13/2021   Non-intractable vomiting 07/13/2020   Port-A-Cath in place 07/08/2020   Genetic testing 05/20/2020   Family history of breast cancer 05/12/2020   Family history of lung cancer 05/12/2020   Malignant neoplasm of lower-outer quadrant of right breast of female, estrogen receptor positive (HCC) 05/03/2020   Elevated troponin level not due myocardial infarction 09/18/2019   Mixed hyperlipidemia 09/18/2019   Hypertensive emergency 09/17/2019   Primary osteoarthritis of right hip 08/06/2018   Seasonal allergies 06/12/2018   Fibrocystic disease of breast 05/27/2018   Obstructive sleep apnea syndrome, moderate 10/27/2013   Shift work sleep disorder 10/27/2013   Psychic factors associated with diseases classified elsewhere 08/07/2013   Pure hypercholesterolemia 07/21/2013   Essential hypertension 07/21/2013   Snoring 07/21/2013   IUD contraception-inserted 10/01/11 10/01/2011   Class 3 severe obesity due to excess calories with serious comorbidity and body mass index (BMI) of 50.0 to 59.9 in adult 09/03/2011    is allergic to pollen extract, compazine  [prochlorperazine ], latex, codeine, diflucan  [fluconazole ], shellfish allergy, and betadine [povidone iodine].  MEDICAL HISTORY: Past Medical History:  Diagnosis Date   Anxiety    Asthma    Back pain    Breast cancer (HCC)  Constipation    DUB (dysfunctional uterine bleeding) 2007   Edema of both lower extremities    Elevated cholesterol    Family history of breast cancer 05/12/2020   Family history of lung cancer 05/12/2020    Food allergy    Grade I diastolic dysfunction 04/30/2023   H/O menorrhagia 05/01/2006   High cholesterol    History of ovarian cyst 2007   Hypertension 03/31/2005   Increased BMI 07/11/2006   Joint pain    Migraine    N&V (nausea and vomiting)    Obesity 2007   Obstructive sleep apnea syndrome, moderate 10/27/2013   no cpap   Personal history of chemotherapy    Personal history of radiation therapy    Sleep apnea    SOB (shortness of breath)    Vitamin D  deficiency    Vitamin D  deficiency    Weight loss 07/11/2006    SURGICAL HISTORY: Past Surgical History:  Procedure Laterality Date   BREAST LUMPECTOMY     BREAST LUMPECTOMY WITH RADIOACTIVE SEED AND SENTINEL LYMPH NODE BIOPSY Right 06/02/2020   Procedure: RIGHT BREAST LUMPECTOMY WITH RADIOACTIVE SEED AND RIGHT SENTINEL LYMPH NODE BIOPSY;  Surgeon: Vanderbilt Ned, MD;  Location: [facility];  Service: General;  Laterality: Right;   BREAST REDUCTION SURGERY  05/04/1991   CESAREAN SECTION     HYSTEROSCOPY  05/01/2006   PORTACATH PLACEMENT Right 06/02/2020   Procedure: INSERTION PORT-A-CATH;  Surgeon: Vanderbilt Ned, MD;  Location: [facility];  Service: General;  Laterality: Right;   REDUCTION MAMMAPLASTY      SOCIAL HISTORY: Social History   Socioeconomic History   Marital status: Single    Spouse name: Not on file   Number of children: Not on file   Years of education: Not on file   Highest education level: Not on file  Occupational History   Occupation: Child psychotherapist  Tobacco Use   Smoking status: Never   Smokeless tobacco: Never  Vaping Use   Vaping status: Never Used  Substance and Sexual Activity   Alcohol use: No   Drug use: Not Currently    Frequency: 2.0 times per week   Sexual activity: Not Currently    Birth control/protection: I.U.D.    Comment: Mirena   Other Topics Concern   Not on file  Social History Narrative   Not on file   Social Drivers of Health    Financial Resource Strain: Low Risk  (11/28/2023)   Overall Financial Resource Strain (CARDIA)    Difficulty of Paying Living Expenses: Not hard at all  Food Insecurity: No Food Insecurity (11/28/2023)   Hunger Vital Sign    Worried About Running Out of Food in the Last Year: Never true    Ran Out of Food in the Last Year: Never true  Transportation Needs: No Transportation Needs (11/28/2023)   PRAPARE - Administrator, Civil Service (Medical): No    Lack of Transportation (Non-Medical): No  Physical Activity: Not on file  Stress: Not on file  Social Connections: Moderately Isolated (04/30/2023)   Social Connection and Isolation Panel    Frequency of Communication with Friends and Family: More than three times a week    Frequency of Social Gatherings with Friends and Family: More than three times a week    Attends Religious Services: More than 4 times per year    Active Member of [employer] or Organizations: No    Attends Banker Meetings: Not on file  Marital Status: Separated  Intimate Partner Violence: Not At Risk (11/28/2023)   Humiliation, Afraid, Rape, and Kick questionnaire    Fear of Current or Ex-Partner: No    Emotionally Abused: No    Physically Abused: No    Sexually Abused: No    FAMILY HISTORY: Family History  Problem Relation Age of Onset   Hypertension Mother    High Cholesterol Mother    Thyroid  disease Mother    Cancer Mother    Sleep apnea Mother    Hypertension Father    Heart attack Father    Sudden death Father    Diabetes Maternal Grandmother    Bone cancer Maternal Grandfather        dx after 41   Breast cancer Maternal Aunt        dx 30s   Lung cancer Maternal Aunt        dx after 50    Review of Systems  Constitutional:  Negative for appetite change, chills, fatigue, fever and unexpected weight change.  HENT:   Negative for hearing loss, lump/mass and trouble swallowing.   Eyes:  Negative for eye problems and icterus.   Respiratory:  Negative for chest tightness, cough and shortness of breath.   Cardiovascular:  Negative for chest pain, leg swelling and palpitations.  Gastrointestinal:  Negative for abdominal distention, abdominal pain, constipation, diarrhea, nausea and vomiting.  Endocrine: Negative for hot flashes.  Genitourinary:  Negative for difficulty urinating.   Musculoskeletal:  Negative for arthralgias.  Skin:  Negative for itching and rash.  Neurological:  Negative for dizziness, extremity weakness, headaches and numbness.  Hematological:  Negative for adenopathy. Does not bruise/bleed easily.  Psychiatric/Behavioral:  Negative for depression. The patient is not nervous/anxious.       PHYSICAL EXAMINATION    Vitals:   11/28/23 1136 11/28/23 1137  BP: (!) 191/86 (!) 185/90  Pulse: 88   Resp: 18   Temp: 98.2 F (36.8 C)     Physical Exam Constitutional:      General: She is not in acute distress.    Appearance: Normal appearance. She is not toxic-appearing.  HENT:     Head: Normocephalic and atraumatic.     Mouth/Throat:     Mouth: Mucous membranes are moist.     Pharynx: Oropharynx is clear. No oropharyngeal exudate or posterior oropharyngeal erythema.  Eyes:     General: No scleral icterus. Cardiovascular:     Rate and Rhythm: Normal rate and regular rhythm.     Pulses: Normal pulses.     Heart sounds: Normal heart sounds.  Pulmonary:     Effort: Pulmonary effort is normal.     Breath sounds: Normal breath sounds.  Abdominal:     General: Abdomen is flat. Bowel sounds are normal. There is no distension.     Palpations: Abdomen is soft.     Tenderness: There is no abdominal tenderness.  Musculoskeletal:        General: No swelling.     Cervical back: Neck supple.  Lymphadenopathy:     Cervical: No cervical adenopathy.  Skin:    General: Skin is warm and dry.     Findings: No rash.  Neurological:     General: No focal deficit present.     Mental Status: She is  alert.  Psychiatric:        Mood and Affect: Mood normal.        Behavior: Behavior normal.     LABORATORY DATA:  CBC  Component Value Date/Time   WBC 3.8 (L) 11/06/2023 0939   WBC 6.5 05/03/2023 0522   RBC 4.51 11/06/2023 0939   HGB 12.7 11/06/2023 0939   HGB 13.2 09/22/2019 1040   HCT 38.1 11/06/2023 0939   HCT 41.3 09/22/2019 1040   PLT 191 11/06/2023 0939   PLT 247 09/22/2019 1040   MCV 84.5 11/06/2023 0939   MCV 83 09/22/2019 1040   MCH 28.2 11/06/2023 0939   MCHC 33.3 11/06/2023 0939   RDW 16.5 (H) 11/06/2023 0939   RDW 14.7 09/22/2019 1040   LYMPHSABS 1.0 11/06/2023 0939   MONOABS 0.7 11/06/2023 0939   EOSABS 0.2 11/06/2023 0939   BASOSABS 0.1 11/06/2023 0939    CMP     Component Value Date/Time   NA 141 11/06/2023 0939   NA 142 09/22/2019 1040   K 3.6 11/06/2023 0939   CL 106 11/06/2023 0939   CO2 29 11/06/2023 0939   GLUCOSE 115 (H) 11/06/2023 0939   BUN 8 11/06/2023 0939   BUN 11 09/22/2019 1040   CREATININE 0.64 11/06/2023 0939   CALCIUM  9.0 11/06/2023 0939   PROT 6.6 11/06/2023 0939   PROT 6.7 06/17/2019 1054   ALBUMIN 3.4 (L) 11/06/2023 0939   ALBUMIN 4.5 06/17/2019 1054   AST 43 (H) 11/06/2023 0939   ALT 26 11/06/2023 0939   ALKPHOS 100 11/06/2023 0939   BILITOT 1.9 (H) 11/06/2023 0939   GFRNONAA >60 11/06/2023 0939   GFRAA 77 09/22/2019 1040     ASSESSMENT and THERAPY PLAN:   Assessment and Plan Assessment & Plan Metastatic breast cancer on Enhertu  Continues on Enhertu  every four weeks. Reports soreness in the right breast, which has improved. Significant pain may contribute to elevated blood pressure. - Continue Enhertu  every four weeks. - Followed by Dr. Cherrie in cardio-oncology  Chemotherapy-induced nausea and vomiting Experiencing nausea and vomiting post-treatment, with dry heaves causing sore throat and hoarseness. Symptoms may be exacerbated by pain and opioid use.  Opioid-induced constipation Severe constipation  following hydrocodone  use resolved after two weeks, causing significant discomfort and dizziness. Improving with stool softener/laxatives and stopping opioids.   Dizziness and imbalance, under evaluation Reports dizziness and imbalance, potentially related to fluid balance issues and pain. Weight decreased from 327 to 321 pounds since early August, possibly due to fluid shifts or bowel movements. - Order labs to evaluate fluid balance and other potential causes of dizziness.  Intermittent edema Experiencing fluctuations in swelling, possibly related to fluid balance issues. Current weight is 321 pounds, down from 327 pounds in early August. - Order labs to assess fluid status.  Hypertension Blood pressure remains elevated, possibly due to pain. Upcoming appointment with primary care provider for blood pressure management and potential medication adjustment. - Schedule follow-up appointment with primary care provider on Monday to address blood pressure management.  RTC in 4 weeks for labs, f/u and her next treatment.      All questions were answered. The patient knows to call the clinic with any problems, questions or concerns. We can certainly see the patient much sooner if necessary.  Total encounter time:20 minutes*in face-to-face visit time, chart review, lab review, care coordination, order entry, and documentation of the encounter time.    Morna Kendall, NP 11/28/23 11:56 AM Medical Oncology and Hematology [facility] [address] [place], [place] [address] Tel. [phone]    Fax. [phone]  *Total Encounter Time as defined by the Centers for Medicare and Medicaid Services includes, in addition to the  face-to-face time of a patient visit (documented in the note above) non-face-to-face time: obtaining and reviewing outside history, ordering and reviewing medications, tests or procedures, care coordination (communications with other health care professionals or  caregivers) and documentation in the medical record.

## 2023-12-02 ENCOUNTER — Ambulatory Visit: Payer: Self-pay | Admitting: Internal Medicine

## 2023-12-02 ENCOUNTER — Telehealth: Payer: Self-pay | Admitting: *Deleted

## 2023-12-02 ENCOUNTER — Encounter: Payer: Self-pay | Admitting: Hematology and Oncology

## 2023-12-02 NOTE — Telephone Encounter (Signed)
 Pamela [name], [phone] (home) calling to request the letter you have in MyChart.  I cannot print it and employer will mot accept an image.    Confirmed email address in EMR is correct.  11/28/2023 Letter for return to work was printed and emailed to [email or web address].  Received Successful Email transmission per [employer] [id number]/

## 2023-12-04 ENCOUNTER — Other Ambulatory Visit

## 2023-12-04 ENCOUNTER — Ambulatory Visit: Admitting: Adult Health

## 2023-12-04 ENCOUNTER — Ambulatory Visit: Admitting: Internal Medicine

## 2023-12-04 ENCOUNTER — Inpatient Hospital Stay: Attending: Hematology and Oncology | Admitting: Licensed Clinical Social Worker

## 2023-12-04 ENCOUNTER — Encounter: Payer: Self-pay | Admitting: Internal Medicine

## 2023-12-04 ENCOUNTER — Ambulatory Visit

## 2023-12-04 VITALS — BP 140/98 | HR 69 | Temp 98.1°F | Ht 60.0 in | Wt 326.2 lb

## 2023-12-04 DIAGNOSIS — Z1731 Human epidermal growth factor receptor 2 positive status: Secondary | ICD-10-CM | POA: Insufficient documentation

## 2023-12-04 DIAGNOSIS — I700 Atherosclerosis of aorta: Secondary | ICD-10-CM | POA: Diagnosis not present

## 2023-12-04 DIAGNOSIS — Z79899 Other long term (current) drug therapy: Secondary | ICD-10-CM | POA: Insufficient documentation

## 2023-12-04 DIAGNOSIS — R252 Cramp and spasm: Secondary | ICD-10-CM | POA: Insufficient documentation

## 2023-12-04 DIAGNOSIS — Z1722 Progesterone receptor negative status: Secondary | ICD-10-CM | POA: Insufficient documentation

## 2023-12-04 DIAGNOSIS — W010XXD Fall on same level from slipping, tripping and stumbling without subsequent striking against object, subsequent encounter: Secondary | ICD-10-CM | POA: Insufficient documentation

## 2023-12-04 DIAGNOSIS — I119 Hypertensive heart disease without heart failure: Secondary | ICD-10-CM | POA: Insufficient documentation

## 2023-12-04 DIAGNOSIS — R17 Unspecified jaundice: Secondary | ICD-10-CM | POA: Insufficient documentation

## 2023-12-04 DIAGNOSIS — E66813 Obesity, class 3: Secondary | ICD-10-CM

## 2023-12-04 DIAGNOSIS — Z91040 Latex allergy status: Secondary | ICD-10-CM | POA: Insufficient documentation

## 2023-12-04 DIAGNOSIS — C50511 Malignant neoplasm of lower-outer quadrant of right female breast: Secondary | ICD-10-CM | POA: Insufficient documentation

## 2023-12-04 DIAGNOSIS — R110 Nausea: Secondary | ICD-10-CM | POA: Insufficient documentation

## 2023-12-04 DIAGNOSIS — F32A Depression, unspecified: Secondary | ICD-10-CM | POA: Insufficient documentation

## 2023-12-04 DIAGNOSIS — Z6841 Body Mass Index (BMI) 40.0 and over, adult: Secondary | ICD-10-CM

## 2023-12-04 DIAGNOSIS — M170 Bilateral primary osteoarthritis of knee: Secondary | ICD-10-CM | POA: Insufficient documentation

## 2023-12-04 DIAGNOSIS — M25511 Pain in right shoulder: Secondary | ICD-10-CM | POA: Insufficient documentation

## 2023-12-04 DIAGNOSIS — S83242D Other tear of medial meniscus, current injury, left knee, subsequent encounter: Secondary | ICD-10-CM

## 2023-12-04 DIAGNOSIS — K5903 Drug induced constipation: Secondary | ICD-10-CM

## 2023-12-04 DIAGNOSIS — Z170 Estrogen receptor positive status [ER+]: Secondary | ICD-10-CM | POA: Insufficient documentation

## 2023-12-04 DIAGNOSIS — Z883 Allergy status to other anti-infective agents status: Secondary | ICD-10-CM | POA: Insufficient documentation

## 2023-12-04 DIAGNOSIS — S83242A Other tear of medial meniscus, current injury, left knee, initial encounter: Secondary | ICD-10-CM | POA: Insufficient documentation

## 2023-12-04 DIAGNOSIS — Z885 Allergy status to narcotic agent status: Secondary | ICD-10-CM | POA: Insufficient documentation

## 2023-12-04 DIAGNOSIS — Z888 Allergy status to other drugs, medicaments and biological substances status: Secondary | ICD-10-CM | POA: Insufficient documentation

## 2023-12-04 DIAGNOSIS — F410 Panic disorder [episodic paroxysmal anxiety] without agoraphobia: Secondary | ICD-10-CM | POA: Insufficient documentation

## 2023-12-04 DIAGNOSIS — Z9989 Dependence on other enabling machines and devices: Secondary | ICD-10-CM

## 2023-12-04 DIAGNOSIS — Z5112 Encounter for antineoplastic immunotherapy: Secondary | ICD-10-CM | POA: Insufficient documentation

## 2023-12-04 DIAGNOSIS — E669 Obesity, unspecified: Secondary | ICD-10-CM | POA: Insufficient documentation

## 2023-12-04 DIAGNOSIS — Z7982 Long term (current) use of aspirin: Secondary | ICD-10-CM | POA: Insufficient documentation

## 2023-12-04 MED ORDER — LINACLOTIDE 145 MCG PO CAPS: ORAL_CAPSULE | ORAL | Status: AC

## 2023-12-04 MED ORDER — WEGOVY 0.5 MG/0.5ML ~~LOC~~ SOAJ: 0.5000 mg | SUBCUTANEOUS | 0 refills | Status: AC

## 2023-12-04 NOTE — Assessment & Plan Note (Addendum)
 Constipation from hydrocodone  and possibly Wegovy . Severe constipation with hydrocodone , exacerbated by nausea medication. Linzess  and Miralax  previously used. - Provide Linzess  145mg  samples if available. - Discuss stool softeners with opioids.

## 2023-12-04 NOTE — Patient Instructions (Signed)

## 2023-12-04 NOTE — Progress Notes (Signed)
 [facility] CSW Progress Note  Clinical Child psychotherapist contacted patient by phone to follow-up on financial concerns.    Interventions: Notified pt of [employer] grant re-opening. Submitted application on pt's behalf Provided contact information for [facility] with [facility] for disability application    Follow Up Plan:  Patient will contact CSW with any support or resource needs    Pamela [name] E HELANE, LCSW Clinical Social Worker [facility]

## 2023-12-04 NOTE — Assessment & Plan Note (Signed)
Chronic, LDL goal is less than 70.  She will continue with rosuvastatin daily.

## 2023-12-04 NOTE — Assessment & Plan Note (Addendum)
 BMI 63.  Obesity exacerbates knee pain and complicates surgery. Weight loss necessary before knee replacement. Interested in weight loss medications. Wegovy  prescribed with potential side effects. -  We discussed the use of Wegovy  for obesity. She denies family/personal h/o thyroid  cancer. She was given 0.25mg  samples and advised how to administer the medication. She understands that she will not take her next dose until next week. She is reminded to stop eating when full. She will rto in 4 - 6 weeks for re-evaluation.  Possible side effects d/w patient.

## 2023-12-04 NOTE — Assessment & Plan Note (Signed)
 Occurred on 08/20/23, now followed by Dr. Duwayne at [facility] per workmen's comp.

## 2023-12-04 NOTE — Progress Notes (Addendum)
 I,Victoria T Emmitt, CMA,acting as a Neurosurgeon for [name], MD.,have documented all relevant documentation on the behalf of [name], MD,as directed by  [name], MD while in the presence of [name], MD.  Subjective:  Patient ID: Pamela [name] , female    DOB: 01-04-1966 , 58 y.o.   MRN: [id number]  Chief Complaint  Patient presents with   Weight Check    Patient presents today for wanting to inquire about weight loss medications. She fell on June 17th tearing her meniscus. She is est with an orthopedic she currently sees regularly. His request was for patient to come in & be further evaluated to see if she qualifies for weight loss medication.     HPI Discussed the use of AI scribe software for clinical note transcription with the patient, who gave verbal consent to proceed.  History of Present Illness Pamela [name] is a 58 year old female with a torn meniscus and osteoarthritis who presents with knee pain and work-related injury follow-up. She states her orthopedist has recommended that she start weight loss meds so she can proceed with surgery.  She experienced a fall at work on August 20, 2023, where she slipped in the bathroom, resulting in a torn meniscus in her left knee. She describes the fall as a 'complete split' and attempted to break her fall by grabbing the sink. Since the incident, she has been working from home and is currently under [employer].  She has a history of bone-on-bone osteoarthritis in both knees and reports being told that a meniscus repair would not be beneficial and that she would probably need a total knee replacement. She received a cortisone shot and a gel injection on November 12, 2023, but reports no relief from pain. The right knee is also painful due to compensatory pressure.  She is experiencing significant pain and was prescribed hydrocodone , which she took until it caused severe constipation. She has tried  various remedies, including Miralax  and Linzess , to alleviate the constipation. She is currently not on any pain medication.  She also reports an injury to her shoulder area from the fall, which she initially thought was just soreness. However, the pain persisted, and she has been unable to get it evaluated as it was not included in the initial incident report.  Her current medications include aspirin , escitalopram, furosemide  as needed, rosuvastatin  40 mg, spironolactone  25 mg, and valsartan  160 mg.  She has a history of anxiety attacks, which have been exacerbated by her current work situation and medical issues. She has been with her current employer for 18 years and is experiencing workplace stress, including harassment and pressure from management.   Hypertension This is a chronic problem. The current episode started more than 1 year ago. The problem has been gradually improving since onset. The problem is controlled. There are no associated agents to hypertension. Risk factors for coronary artery disease include obesity and sedentary lifestyle. Past treatments include diuretics and angiotensin blockers. There are no compliance problems.  There is no history of angina.     Past Medical History:  Diagnosis Date   Anxiety    Asthma    Back pain    Breast cancer (HCC)    Constipation    DUB (dysfunctional uterine bleeding) 2007   Edema of both lower extremities    Elevated cholesterol    Family history of breast cancer 05/12/2020   Family history of lung cancer 05/12/2020  Food allergy    Grade I diastolic dysfunction 04/30/2023   H/O menorrhagia 05/01/2006   High cholesterol    History of ovarian cyst 2007   Hypertension 03/31/2005   Increased BMI 07/11/2006   Joint pain    Migraine    N&V (nausea and vomiting)    Obesity 2007   Obstructive sleep apnea syndrome, moderate 10/27/2013   no cpap   Personal history of chemotherapy    Personal history of radiation therapy     Sleep apnea    SOB (shortness of breath)    Vitamin D  deficiency    Vitamin D  deficiency    Weight loss 07/11/2006     Family History  Problem Relation Age of Onset   Hypertension Mother    High Cholesterol Mother    Thyroid  disease Mother    Cancer Mother    Sleep apnea Mother    Hypertension Father    Heart attack Father    Sudden death Father    Diabetes Maternal Grandmother    Bone cancer Maternal Grandfather        dx after 34   Breast cancer Maternal Aunt        dx 30s   Lung cancer Maternal Aunt        dx after 50     Current Outpatient Medications:    acetaminophen  (TYLENOL ) 500 MG tablet, Take 1 tablet (500 mg total) by mouth every 6 (six) hours as needed., Disp: 30 tablet, Rfl: 0   albuterol  (VENTOLIN  HFA) 108 (90 Base) MCG/ACT inhaler, Inhale 2 puffs into the lungs every 6 (six) hours as needed for wheezing or shortness of breath., Disp: 8 g, Rfl: 2   aspirin  EC 81 MG tablet, Take 1 tablet (81 mg total) by mouth daily. Swallow whole., Disp: 30 tablet, Rfl: 11   escitalopram (LEXAPRO) 10 MG tablet, Take 10 mg by mouth daily. Take one tablet by mouth once daily., Disp: , Rfl:    furosemide  (LASIX ) 20 MG tablet, Take 1 tablet (20 mg total) by mouth as needed. TAKE 1 TABLET AS NEEDED FOR SWELLING OR SHORTNESS OF BREATH OR WEIGHT GAIN. 3 POUNDS WITHIN 24HR OR 5 POUNDS 7 DAYS, Disp: 90 tablet, Rfl: 0   HYDROcodone -acetaminophen  (NORCO/VICODIN) 5-325 MG tablet, Take 1 tablet by mouth., Disp: , Rfl:    levonorgestrel  (MIRENA , 52 MG,) 20 MCG/DAY IUD, 1 each by Intrauterine route once., Disp: , Rfl:    lidocaine -prilocaine  (EMLA ) cream, Apply to affected area once AS DIRECTED, Disp: 30 g, Rfl: 3   linaclotide  (LINZESS ) 145 MCG CAPS capsule, One cap po qd, Disp: , Rfl:    LORazepam  (ATIVAN ) 0.5 MG tablet, Take 0.5 mg by mouth daily. Take one tablet by mouth once daily as needed., Disp: , Rfl:    potassium chloride  (KLOR-CON  M) 10 MEQ tablet, Take 2 tablets (20 mEq total) by  mouth 2 (two) times daily., Disp: 60 tablet, Rfl: 6   rosuvastatin  (CRESTOR ) 40 MG tablet, Take 1 tablet (40 mg total) by mouth daily., Disp: 90 tablet, Rfl: 3   semaglutide -weight management (WEGOVY ) 0.5 MG/0.5ML SOAJ SQ injection, Inject 0.5 mg into the skin once a week., Disp: 2 mL, Rfl: 0   Spacer/Aero-Holding Chambers (AEROCHAMBER PLUS) Device, Please dispense spacer for inhaler use, Disp: 1 each, Rfl: 0   spironolactone  (ALDACTONE ) 25 MG tablet, Take 1 tablet (25 mg total) by mouth daily., Disp: 30 tablet, Rfl: 6   valsartan  (DIOVAN ) 160 MG tablet, Take 1 tablet (160 mg total)  by mouth daily., Disp: 30 tablet, Rfl: 11   venlafaxine  XR (EFFEXOR -XR) 37.5 MG 24 hr capsule, Take 37.5 mg by mouth every morning., Disp: , Rfl:  No current facility-administered medications for this visit.  Facility-Administered Medications Ordered in Other Visits:    heparin  lock flush 100 unit/mL, 500 Units, Intracatheter, Once, Odean Potts, MD   sodium chloride  flush (NS) 0.9 % injection 10 mL, 10 mL, Intracatheter, Once, Gudena, Vinay, MD   Allergies  Allergen Reactions   Pollen Extract Shortness Of Breath   Compazine  [Prochlorperazine ] Anxiety    IV compazine  causes severe anxiety attack   Latex Rash   Codeine Hives   Diflucan  [Fluconazole ] Hives   Shellfish Allergy Hives   Betadine [Povidone Iodine] Rash     Review of Systems  Constitutional: Negative.   Respiratory: Negative.    Cardiovascular: Negative.   Gastrointestinal:  Positive for constipation.  Musculoskeletal:  Positive for arthralgias.  Neurological: Negative.   Psychiatric/Behavioral:  The patient is nervous/anxious.      Today's Vitals   12/04/23 1625 12/04/23 1706  BP: (!) 150/100 (!) 140/98  Pulse: 69   Temp: 98.1 F (36.7 C)   SpO2: 98%   Weight: (!) 326 lb 3.2 oz (148 kg)   Height: 5' (1.524 m)    Body mass index is 63.71 kg/m.  Wt Readings from Last 3 Encounters:  12/04/23 (!) 326 lb 3.2 oz (148 kg)  11/28/23  (!) 321 lb 12.8 oz (146 kg)  11/06/23 (!) 319 lb 14.4 oz (145.1 kg)     Objective:  Physical Exam Vitals and nursing note reviewed.  Constitutional:      Appearance: Normal appearance. She is obese.  HENT:     Head: Normocephalic and atraumatic.  Eyes:     Extraocular Movements: Extraocular movements intact.  Cardiovascular:     Rate and Rhythm: Normal rate and regular rhythm.     Heart sounds: Normal heart sounds.  Pulmonary:     Effort: Pulmonary effort is normal.     Breath sounds: Normal breath sounds.  Musculoskeletal:     Cervical back: Normal range of motion.     Comments: Ambulatory with cane  Skin:    General: Skin is warm.  Neurological:     General: No focal deficit present.     Mental Status: She is alert.  Psychiatric:        Mood and Affect: Mood normal.        Behavior: Behavior normal.         Assessment And Plan:  Class 3 severe obesity due to excess calories with serious comorbidity and body mass index (BMI) of 60.0 to 69.9 in adult [facility]) Assessment & Plan: BMI 63.  Obesity exacerbates knee pain and complicates surgery. Weight loss necessary before knee replacement. Interested in weight loss medications. Wegovy  prescribed with potential side effects. -  We discussed the use of Wegovy  for obesity. She denies family/personal h/o thyroid  cancer. She was given 0.25mg  samples and advised how to administer the medication. She understands that she will not take her next dose until next week. She is reminded to stop eating when full. She will rto in 4 - 6 weeks for re-evaluation.  Possible side effects d/w patient.     Acute medial meniscus tear of left knee, subsequent encounter Assessment & Plan: Left knee meniscus tear and osteoarthritis Chronic left knee pain due to meniscus tear and osteoarthritis, exacerbated by a fall. Bone-on-bone arthritis in both knees, left knee more  symptomatic. Previous injections ineffective. Total knee replacement pending weight  loss. Workman's comp covers injury. - Continue follow-up with orthopedic surgeon. - Discuss weight loss strategies and medications with primary care provider. - Consider referral to weight management program.   Fall from slip, trip, or stumble, subsequent encounter Assessment & Plan: Occurred on 08/20/23, now followed by Dr. Duwayne at [facility] per workmen's comp.    Hypertensive heart disease without heart failure Assessment & Plan: Hypertension managed with valsartan , spironolactone , and furosemide . Recent anxiety attacks possibly exacerbating blood pressure. - Continue current antihypertensive regimen. - Monitor blood pressure. - Follow low sodium diet   Aortic atherosclerosis Assessment & Plan: Chronic, LDL goal is less than 70.  She will continue with rosuvastatin  daily.    Drug-induced constipation Assessment & Plan: Constipation from hydrocodone  and possibly Wegovy . Severe constipation with hydrocodone , exacerbated by nausea medication. Linzess  and Miralax  previously used. - Provide Linzess  145mg  samples if available. - Discuss stool softeners with opioids.   Ambulates with cane  Other orders -     Wegovy ; Inject 0.5 mg into the skin once a week.  Dispense: 2 mL; Refill: 0 -     linaCLOtide ; One cap po qd   Return if symptoms worsen or fail to improve.  Patient was given opportunity to ask questions. Patient verbalized understanding of the plan and was able to repeat key elements of the plan. All questions were answered to their satisfaction.   I, [name], MD, have reviewed all documentation for this visit. The documentation on 12/04/23 for the exam, diagnosis, procedures, and orders are all accurate and complete.   IF YOU HAVE BEEN REFERRED TO A SPECIALIST, IT MAY TAKE 1-2 WEEKS TO SCHEDULE/PROCESS THE REFERRAL. IF YOU HAVE NOT HEARD FROM US /SPECIALIST IN TWO WEEKS, PLEASE GIVE US  A CALL AT [phone].   THE PATIENT IS ENCOURAGED TO PRACTICE  SOCIAL DISTANCING DUE TO THE COVID-19 PANDEMIC.

## 2023-12-04 NOTE — Assessment & Plan Note (Signed)
 Hypertension managed with valsartan , spironolactone , and furosemide . Recent anxiety attacks possibly exacerbating blood pressure. - Continue current antihypertensive regimen. - Monitor blood pressure. - Follow low sodium diet

## 2023-12-04 NOTE — Assessment & Plan Note (Signed)
 Left knee meniscus tear and osteoarthritis Chronic left knee pain due to meniscus tear and osteoarthritis, exacerbated by a fall. Bone-on-bone arthritis in both knees, left knee more symptomatic. Previous injections ineffective. Total knee replacement pending weight loss. Workman's comp covers injury. - Continue follow-up with orthopedic surgeon. - Discuss weight loss strategies and medications with primary care provider. - Consider referral to weight management program.

## 2023-12-05 ENCOUNTER — Other Ambulatory Visit: Payer: Self-pay

## 2023-12-06 ENCOUNTER — Ambulatory Visit: Payer: Self-pay

## 2023-12-06 ENCOUNTER — Encounter: Payer: Self-pay | Admitting: Licensed Clinical Social Worker

## 2023-12-06 NOTE — Progress Notes (Signed)
 [facility] Clinical Social Work  Environmental education officer on [employer].   Rommie Dunn E Valecia Beske, LCSW

## 2023-12-06 NOTE — Telephone Encounter (Signed)
 FYI Only or Action Required?: FYI only for provider.  Patient was last seen in primary care on 12/04/2023 by Pamela Medici, MD.  Called Nurse Triage reporting Medication Problem.  Symptoms began yesterday.  Interventions attempted: Nothing.  Symptoms are: gradually worsening.  Triage Disposition: Call PCP Now  Patient/caregiver understands and will follow disposition?: Yes     Copied from CRM #[id number]. Topic: Clinical - Medical Advice >> Dec 06, 2023 10:32 AM Pamela [name] wrote: Reason for CRM: Pt stated she was in to see Dr. Jarold on Wednesday and was prescribed some medication for weight loss. Pt was told to contact clinic if she is experiencing any side effects because the dosage may have to be lowered. Pt stated she has experienced some mild nausea Wednesday but last night and today it seems more severe. Pt is wanting to be contacted to know her next steps due to current symptoms. Reason for Disposition  [1] Caller has URGENT medicine question about med that primary care doctor (or NP/PA) or specialist prescribed AND [2] triager unable to answer question  Answer Assessment - Initial Assessment Questions Patient requesting information on next steps due to her symptoms.   1. NAME of MEDICINE: What medicine([name]) are you calling about?      Wegovy  2. QUESTION: What is your question? (e.g., double dose of medicine, side effect)     Side effects 3. PRESCRIBER: Who prescribed the medicine? Reason: if prescribed by specialist, call should be referred to that group.     PCP  4. SYMPTOMS: Do you have any symptoms? If Yes, ask: What symptoms are you having?  How bad are the symptoms (e.g., mild, moderate, severe)     Mild nausea all last night that is continuing today. Patient states she is not having any additional symptoms.  Protocols used: Medication Question Call-A-AH

## 2023-12-09 ENCOUNTER — Ambulatory Visit: Admitting: Internal Medicine

## 2023-12-10 MED FILL — Fosaprepitant Dimeglumine For IV Infusion 150 MG (Base Eq): INTRAVENOUS | Qty: 5 | Status: AC

## 2023-12-11 ENCOUNTER — Inpatient Hospital Stay (HOSPITAL_BASED_OUTPATIENT_CLINIC_OR_DEPARTMENT_OTHER): Admitting: Hematology and Oncology

## 2023-12-11 ENCOUNTER — Inpatient Hospital Stay

## 2023-12-11 VITALS — BP 165/77 | HR 70 | Resp 18

## 2023-12-11 VITALS — BP 180/8 | HR 96 | Temp 98.4°F | Resp 18 | Ht 60.0 in | Wt 322.6 lb

## 2023-12-11 DIAGNOSIS — F32A Depression, unspecified: Secondary | ICD-10-CM | POA: Diagnosis not present

## 2023-12-11 DIAGNOSIS — I700 Atherosclerosis of aorta: Secondary | ICD-10-CM | POA: Diagnosis not present

## 2023-12-11 DIAGNOSIS — R110 Nausea: Secondary | ICD-10-CM | POA: Diagnosis not present

## 2023-12-11 DIAGNOSIS — C50919 Malignant neoplasm of unspecified site of unspecified female breast: Secondary | ICD-10-CM

## 2023-12-11 DIAGNOSIS — C50511 Malignant neoplasm of lower-outer quadrant of right female breast: Secondary | ICD-10-CM | POA: Diagnosis not present

## 2023-12-11 DIAGNOSIS — Z91040 Latex allergy status: Secondary | ICD-10-CM | POA: Diagnosis not present

## 2023-12-11 DIAGNOSIS — Z7982 Long term (current) use of aspirin: Secondary | ICD-10-CM | POA: Diagnosis not present

## 2023-12-11 DIAGNOSIS — Z5112 Encounter for antineoplastic immunotherapy: Secondary | ICD-10-CM | POA: Diagnosis present

## 2023-12-11 DIAGNOSIS — R252 Cramp and spasm: Secondary | ICD-10-CM | POA: Diagnosis not present

## 2023-12-11 DIAGNOSIS — Z170 Estrogen receptor positive status [ER+]: Secondary | ICD-10-CM

## 2023-12-11 DIAGNOSIS — I119 Hypertensive heart disease without heart failure: Secondary | ICD-10-CM | POA: Diagnosis not present

## 2023-12-11 DIAGNOSIS — Z1731 Human epidermal growth factor receptor 2 positive status: Secondary | ICD-10-CM | POA: Diagnosis not present

## 2023-12-11 DIAGNOSIS — Z888 Allergy status to other drugs, medicaments and biological substances status: Secondary | ICD-10-CM | POA: Diagnosis not present

## 2023-12-11 DIAGNOSIS — Z885 Allergy status to narcotic agent status: Secondary | ICD-10-CM | POA: Diagnosis not present

## 2023-12-11 DIAGNOSIS — Z883 Allergy status to other anti-infective agents status: Secondary | ICD-10-CM | POA: Diagnosis not present

## 2023-12-11 DIAGNOSIS — Z95828 Presence of other vascular implants and grafts: Secondary | ICD-10-CM

## 2023-12-11 DIAGNOSIS — F410 Panic disorder [episodic paroxysmal anxiety] without agoraphobia: Secondary | ICD-10-CM | POA: Diagnosis not present

## 2023-12-11 DIAGNOSIS — Z1722 Progesterone receptor negative status: Secondary | ICD-10-CM | POA: Diagnosis not present

## 2023-12-11 DIAGNOSIS — R17 Unspecified jaundice: Secondary | ICD-10-CM | POA: Diagnosis not present

## 2023-12-11 DIAGNOSIS — M170 Bilateral primary osteoarthritis of knee: Secondary | ICD-10-CM | POA: Diagnosis not present

## 2023-12-11 DIAGNOSIS — M25511 Pain in right shoulder: Secondary | ICD-10-CM | POA: Diagnosis not present

## 2023-12-11 DIAGNOSIS — Z79899 Other long term (current) drug therapy: Secondary | ICD-10-CM | POA: Diagnosis not present

## 2023-12-11 DIAGNOSIS — E669 Obesity, unspecified: Secondary | ICD-10-CM | POA: Diagnosis not present

## 2023-12-11 LAB — CBC WITH DIFFERENTIAL (CANCER CENTER ONLY)
Abs Immature Granulocytes: 0.01 K/uL (ref 0.00–0.07)
Basophils Absolute: 0.1 K/uL (ref 0.0–0.1)
Basophils Relative: 1 %
Eosinophils Absolute: 0.2 K/uL (ref 0.0–0.5)
Eosinophils Relative: 6 %
HCT: 37.2 % (ref 36.0–46.0)
Hemoglobin: 12.1 g/dL (ref 12.0–15.0)
Immature Granulocytes: 0 %
Lymphocytes Relative: 27 %
Lymphs Abs: 1 K/uL (ref 0.7–4.0)
MCH: 27.4 pg (ref 26.0–34.0)
MCHC: 32.5 g/dL (ref 30.0–36.0)
MCV: 84.2 fL (ref 80.0–100.0)
Monocytes Absolute: 0.6 K/uL (ref 0.1–1.0)
Monocytes Relative: 17 %
Neutro Abs: 1.8 K/uL (ref 1.7–7.7)
Neutrophils Relative %: 49 %
Platelet Count: 130 K/uL — ABNORMAL LOW (ref 150–400)
RBC: 4.42 MIL/uL (ref 3.87–5.11)
RDW: 15.6 % — ABNORMAL HIGH (ref 11.5–15.5)
WBC Count: 3.6 K/uL — ABNORMAL LOW (ref 4.0–10.5)
nRBC: 0 % (ref 0.0–0.2)

## 2023-12-11 LAB — CMP (CANCER CENTER ONLY)
ALT: 22 U/L (ref 0–44)
AST: 39 U/L (ref 15–41)
Albumin: 3.4 g/dL — ABNORMAL LOW (ref 3.5–5.0)
Alkaline Phosphatase: 104 U/L (ref 38–126)
Anion gap: 4 — ABNORMAL LOW (ref 5–15)
BUN: 8 mg/dL (ref 6–20)
CO2: 27 mmol/L (ref 22–32)
Calcium: 9 mg/dL (ref 8.9–10.3)
Chloride: 109 mmol/L (ref 98–111)
Creatinine: 0.62 mg/dL (ref 0.44–1.00)
GFR, Estimated: >60 mL/min (ref >60)
Glucose, Bld: 113 mg/dL — ABNORMAL HIGH (ref 70–99)
Potassium: 3.5 mmol/L (ref 3.5–5.1)
Sodium: 140 mmol/L (ref 135–145)
Total Bilirubin: 1.9 mg/dL — ABNORMAL HIGH (ref 0.0–1.2)
Total Protein: 6.6 g/dL (ref 6.5–8.1)

## 2023-12-11 MED ORDER — DEXTROSE 5 % IV SOLN: Freq: Once | INTRAVENOUS | Status: AC

## 2023-12-11 MED ORDER — SODIUM CHLORIDE 0.9 % IV SOLN
150.0000 mg | Freq: Once | INTRAVENOUS | Status: AC
  Administered 2023-12-11: 150 mg via INTRAVENOUS
  Filled 2023-12-11: qty 150

## 2023-12-11 MED ORDER — SODIUM CHLORIDE 0.9% FLUSH
10.0000 mL | INTRAVENOUS | Status: DC | PRN
  Administered 2023-12-11: 10 mL

## 2023-12-11 MED ORDER — FAM-TRASTUZUMAB DERUXTECAN-NXKI CHEMO 100 MG IV SOLR
300.0000 mg | Freq: Once | INTRAVENOUS | Status: AC
  Administered 2023-12-11: 300 mg via INTRAVENOUS
  Filled 2023-12-11: qty 15

## 2023-12-11 MED ORDER — MELOXICAM 7.5 MG PO TABS: 7.5000 mg | ORAL_TABLET | Freq: Every day | ORAL | 3 refills | Status: AC

## 2023-12-11 MED ORDER — PALONOSETRON HCL INJECTION 0.25 MG/5ML
0.2500 mg | Freq: Once | INTRAVENOUS | Status: AC
  Administered 2023-12-11: 0.25 mg via INTRAVENOUS
  Filled 2023-12-11: qty 5

## 2023-12-11 MED ORDER — SODIUM CHLORIDE 0.9 % IV SOLN: Freq: Once | INTRAVENOUS | Status: DC

## 2023-12-11 MED ORDER — ACETAMINOPHEN 325 MG PO TABS
650.0000 mg | ORAL_TABLET | Freq: Once | ORAL | Status: AC
  Administered 2023-12-11: 650 mg via ORAL
  Filled 2023-12-11: qty 2

## 2023-12-11 MED ORDER — CYANOCOBALAMIN 1000 MCG/ML IJ SOLN
1000.0000 ug | Freq: Once | INTRAMUSCULAR | Status: AC
  Administered 2023-12-11: 1000 ug via INTRAMUSCULAR
  Filled 2023-12-11: qty 1

## 2023-12-11 NOTE — Progress Notes (Signed)
 Patient Care Team: [name], MD as PCP - General (Internal Medicine) Vernetta Lonni GRADE, MD as Consulting Physician (Orthopedic Surgery) Tyree Nanetta SAILOR, RN as Oncology Nurse Navigator Vanderbilt Ned, MD as Consulting Physician (General Surgery) Odean Potts, MD as Consulting Physician (Hematology and Oncology) Izell Domino, MD as Attending Physician (Radiation Oncology)  DIAGNOSIS:  Encounter Diagnosis  Name Primary?   Malignant neoplasm of lower-outer quadrant of right breast of female, estrogen receptor positive (HCC) Yes    SUMMARY OF ONCOLOGIC HISTORY: Oncology History  Malignant neoplasm of lower-outer quadrant of right breast of female, estrogen receptor positive (HCC)  05/03/2020 Initial Diagnosis   Screening mammogram showed a 1.0cm mass at the 6 o'clock position in the right breast. Diagnostic mammogram and US  showed the 1.2cm mass at the 6 o'clock position in the right breast. Biopsy showed IDC, grade 3, HER-2 positive (3+), ER 15% weak, PR-, Ki67 80%.   05/19/2020 Genetic Testing   Negative hereditary cancer genetic testing: no pathogenic variants detected in [facility] Breast STAT Panel and Common Hereditary Cancers Panel.  The report dates are May 19, 2020 (STAT) and May 23, 2020 (Common Hereditary).   The Common Hereditary Cancers Panel offered by [facility] includes sequencing and/or deletion duplication testing of the following 47 genes: APC, ATM, AXIN2, BARD1, BMPR1A, BRCA1, BRCA2, BRIP1, CDH1, CDK4, CDKN2A (p14ARF), CDKN2A (p16INK4a), CHEK2, CTNNA1, DICER1, EPCAM (Deletion/duplication testing only), GREM1 (promoter region deletion/duplication testing only), GREM1, HOXB13, KIT, MEN1, MLH1, MSH2, MSH3, MSH6, MUTYH, NBN, NF1, NHTL1, PALB2, PDGFRA, PMS2, POLD1, POLE, PTEN, RAD50, RAD51C, RAD51D, SDHA, SDHB, SDHC, SDHD, SMAD4, SMARCA4. STK11, TP53, TSC1, TSC2, and VHL.  The following genes were evaluated for sequence changes only: SDHA and HOXB13 c.251G>A variant  only.es only: SDHA and HOXB13 c.251G>A variant only.   06/02/2020 Surgery   Right lumpectomy ([facility]): invasive and in situ ductal carcinoma, 1.3cm, clear margins, 1 right axillary lymph node negative for carcinoma.   06/02/2020 Cancer Staging   Staging form: Breast, AJCC 8th Edition - Pathologic stage from 06/02/2020: Stage IA (pT1c, pN0, cM0, G3, ER+, PR-, HER2+) - Signed by Crawford Morna Pickle, NP on 06/15/2020 Stage prefix: Initial diagnosis Histologic grading system: 3 grade system   07/08/2020 - 10/21/2020 Chemotherapy   Taxol  Herceptin  followed by Herceptin  maintenance   04/14/2021 -  Anti-estrogen oral therapy   Tamoxifen  10 mg   07/13/2021 Imaging   CT angiogram: Right supraclavicular lymph node 5 cm, right internal mammary lymph nodes 1.8 cm    08/06/2021 PET scan   PET CT scan: Ext Right Supra clav LN along with Internal mammary and Rt Upper Mediastinal and Rt Axillary LN   08/2021 Initial Biopsy   Right supraclavicular lymphadenopathy: Biopsy: Metastatic poorly differentiated adenocarcinoma, ER +40%, PR negative, HER2 positive 3+    08/21/2021 -  Chemotherapy   Enhertu  every 3 weeks    03/08/2022 Imaging   CT chest/abdomen/pelvis  1. No new or progressive findings in the chest, abdomen or pelvis. 2. Signs of RIGHT breast lumpectomy with areas of central fat necrosis unchanged from previous imaging. 3. Stable small lymph nodes throughout the chest, largest with area of soft tissue about surgical clips in the RIGHT axilla. 4. Stable appearance of thoracic inlet lymph nodes, not shown to be hypermetabolic on prior PET imaging. 5. IUD in-situ. 6. Aortic atherosclerosis.   08/21/2022 Imaging   Echocardiogram  -normal LVEF at 60-65%. -no left ventricular wall motion abnormalities  -normal left ventricular diastolic function  -mild left ventricular hypertrophy.    10/05/2022  Imaging   CT Chest, Abdomen, and Pelvis with contrast IMPRESSION: 1. Stable examination without  new or progressive finding in the chest, abdomen or pelvis to suggest recurrent or metastatic disease. 2. Stable tiny bilateral thoracic inlet lymph nodes. 3.  Aortic Atherosclerosis (ICD10-I70.0).     CHIEF COMPLIANT: Follow-up on Enhertu   HISTORY OF PRESENT ILLNESS:   History of Present Illness Pamela [name] is a 58 year old female who presents with ongoing pain management issues and work-related stress.  She experiences significant knee pain following an incident on June 17th, treated with a cortisone shot and gel injection. Tramadol caused leg cramps, and hydrocodone  led to nausea. Persistent shoulder pain from the same incident remains unevaluated due to workman's compensation delays.  She started Wegovy  for weight loss, experiencing extreme nausea after the first injection. Concerns about potential side effects, including changes in potassium levels, are noted.  She has elevated blood pressure, with recent readings around 190/101, potentially related to pain and stress. Elevated bilirubin levels are monitored but not linked to specific liver pathology.     ALLERGIES:  is allergic to pollen extract, compazine  [prochlorperazine ], latex, codeine, diflucan  [fluconazole ], shellfish allergy, and betadine [povidone iodine].  MEDICATIONS:  Current Outpatient Medications  Medication Sig Dispense Refill   acetaminophen  (TYLENOL ) 500 MG tablet Take 1 tablet (500 mg total) by mouth every 6 (six) hours as needed. 30 tablet 0   albuterol  (VENTOLIN  HFA) 108 (90 Base) MCG/ACT inhaler Inhale 2 puffs into the lungs every 6 (six) hours as needed for wheezing or shortness of breath. 8 g 2   aspirin  EC 81 MG tablet Take 1 tablet (81 mg total) by mouth daily. Swallow whole. 30 tablet 11   escitalopram (LEXAPRO) 10 MG tablet Take 10 mg by mouth daily. Take one tablet by mouth once daily.     furosemide  (LASIX ) 20 MG tablet Take 1 tablet (20 mg total) by mouth as needed. TAKE 1 TABLET AS NEEDED FOR  SWELLING OR SHORTNESS OF BREATH OR WEIGHT GAIN. 3 POUNDS WITHIN 24HR OR 5 POUNDS 7 DAYS 90 tablet 0   HYDROcodone -acetaminophen  (NORCO/VICODIN) 5-325 MG tablet Take 1 tablet by mouth.     levonorgestrel  (MIRENA , 52 MG,) 20 MCG/DAY IUD 1 each by Intrauterine route once.     lidocaine -prilocaine  (EMLA ) cream Apply to affected area once AS DIRECTED 30 g 3   linaclotide  (LINZESS ) 145 MCG CAPS capsule One cap po qd     LORazepam  (ATIVAN ) 0.5 MG tablet Take 0.5 mg by mouth daily. Take one tablet by mouth once daily as needed.     potassium chloride  (KLOR-CON  M) 10 MEQ tablet Take 2 tablets (20 mEq total) by mouth 2 (two) times daily. 60 tablet 6   rosuvastatin  (CRESTOR ) 40 MG tablet Take 1 tablet (40 mg total) by mouth daily. 90 tablet 3   semaglutide -weight management (WEGOVY ) 0.5 MG/0.5ML SOAJ SQ injection Inject 0.5 mg into the skin once a week. 2 mL 0   Spacer/Aero-Holding Chambers (AEROCHAMBER PLUS) Device Please dispense spacer for inhaler use 1 each 0   spironolactone  (ALDACTONE ) 25 MG tablet Take 1 tablet (25 mg total) by mouth daily. 30 tablet 6   valsartan  (DIOVAN ) 160 MG tablet Take 1 tablet (160 mg total) by mouth daily. 30 tablet 11   venlafaxine  XR (EFFEXOR -XR) 37.5 MG 24 hr capsule Take 37.5 mg by mouth every morning.     No current facility-administered medications for this visit.   Facility-Administered Medications Ordered in Other Visits  Medication Dose  Route Frequency Provider Last Rate Last Admin   heparin  lock flush 100 unit/mL  500 Units Intracatheter Once Odean Potts, MD       sodium chloride  flush (NS) 0.9 % injection 10 mL  10 mL Intracatheter Once Odean Potts, MD        PHYSICAL EXAMINATION: ECOG PERFORMANCE STATUS: 1 - Symptomatic but completely ambulatory  Vitals:   12/11/23 1000  BP: (!) 180/8  Pulse: 96  Resp: 18  Temp: 98.4 F (36.9 C)  SpO2: 99%   Filed Weights   12/11/23 1000  Weight: (!) 322 lb 9.6 oz (146.3 kg)    Physical Exam VITALS: BP-  180/100  (exam performed in the presence of a chaperone)  LABORATORY DATA:  I have reviewed the data as listed    Latest Ref Rng & Units 11/28/2023   12:35 PM 11/06/2023    9:39 AM 10/09/2023   10:42 AM  CMP  Glucose 70 - 99 mg/dL 95  884  862   BUN 6 - 20 mg/dL 5  8  7    Creatinine 0.44 - 1.00 mg/dL 9.29  9.35  9.25   Sodium 135 - 145 mmol/L 141  141  141   Potassium 3.5 - 5.1 mmol/L 3.4  3.6  3.4   Chloride 98 - 111 mmol/L 107  106  106   CO2 22 - 32 mmol/L 29  29  32   Calcium  8.9 - 10.3 mg/dL 9.0  9.0  8.8   Total Protein 6.5 - 8.1 g/dL 6.5  6.6  6.4   Total Bilirubin 0.0 - 1.2 mg/dL 2.4  1.9  1.4   Alkaline Phos 38 - 126 U/L 110  100  130   AST 15 - 41 U/L 39  43  43   ALT 0 - 44 U/L 21  26  37     Lab Results  Component Value Date   WBC 3.5 (L) 11/28/2023   HGB 12.3 11/28/2023   HCT 37.5 11/28/2023   MCV 85.0 11/28/2023   PLT 170 11/28/2023   NEUTROABS 2.0 11/28/2023    ASSESSMENT & PLAN:  Malignant neoplasm of lower-outer quadrant of right breast of female, estrogen receptor positive (HCC) 06/02/2020:Right lumpectomy ([facility]): invasive and in situ ductal carcinoma, 1.3cm, clear margins, 1 right axillary lymph node negative for carcinoma. ER 15% weak, PR-,HER-2 positive (3+) Ki67 80%.   Treatment plan: 1.  Adjuvant chemotherapy with Taxol  Herceptin  followed by Herceptin  maintenance.  Stopped after 11 cycles of Taxol  Herceptin  maintenance completed 07/07/2021  2.  Adjuvant radiation therapy 11/23/2020-12/19/2020 3.  Adjuvant antiestrogen therapy with tamoxifen  started October 2022 4.  CT angiogram 07/13/2021: Right supraclavicular lymph node 5 cm, right internal mammary lymph nodes 1.8 cm 5.  Metastatic breast cancer: June 2023:Right supraclavicular lymphadenopathy: Biopsy: Metastatic poorly differentiated adenocarcinoma, ER +40%, PR negative, HER2 positive 3+ PET CT scan 08/06/21: Ext Right Supra clav LN along with Internal mammary and Rt Upper Mediastinal and Rt Axillary  LN  CT CAP 06/10/2023: Small supraclavicular lymph node on the left 7 mm, right axillary nodular thickening 1.5 cm overall stable disease, and remained in lung infiltrates. Echocardiogram based upon Dr. Nelle recommendation. -----------------------------------------------------------------------------------------------------  Current treatment: Enhertu  started on 08/21/2021 cycle 35, now every 5 weeks Enhertu  toxicities:   Hospitalization 04/29/2023-05/04/2023: Community-acquired pneumonia Chemo-induced peripheral neuropathy Bilirubin elevated at 1.9, up from 1.4 and 1.7  Hypertension issues Swelling of hands and feet possibly related to hypertension  Patient fell at work and is seriously injured  her shoulder as well as bilateral knees. She is unable to sit or stand for any length of time. 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  CT CAP 10/28/23: No evidence of lymphadenopathy or metastatic disease  Return to clinic every 5 weeks for Enhertu  ------------------------------------- Assessment and Plan Assessment & Plan Malignant neoplasm of lower-outer quadrant of right breast, ER+ ER+ breast cancer under HER2 therapy. Patient reports nausea and stomach issues due to Wegovy  medication. Issues with medication administered by [facility] for shoulder and knee pain, such as cortisone shot not providing relief and medication causing leg cramps. - Continue current HER2 therapy regimen.  Bilateral knee osteoarthritis with severe pain and functional impairment Severe bilateral knee pain and impairment due to osteoarthritis. Certain medications caused side effects that made her difficult to tolerate. Total knee replacement contingent on weight loss. Wegovy  initiated for weight loss, causing nausea. - Prescribe meloxicam for pain management, alternate with Tylenol . - Consider referral to pain management specialist for further evaluation and  potential nerve treatment. - Encourage weight loss for potential knee replacement surgery. - Monitor for Wegovy  side effects, particularly nausea and potassium imbalance. - Discuss alternative weight loss medications if Wegovy  is not tolerated.  Obesity, currently managed with anti-obesity medication Obesity managed with Wegovy , causing significant nausea. Concerns about potential potassium imbalance. Insurance coverage is an issue. - Continue Wegovy  for weight loss to facilitate knee surgery. - Monitor for side effects, particularly nausea and potential potassium imbalance. - Discuss alternative weight loss medications if Wegovy  is not tolerated.  Right shoulder pain, work-related injury, pending evaluation Right shoulder pain from work injury. Evaluation delayed due to workers' compensation issues. Pain affects work ability. Immunologist for workers' Data processing manager for shoulder evaluation and treatment. - Consider changing workers' Chief Operating Officer and nurse case manager if current providers are inadequate.  Hypertension Hypertension with elevated readings, likely exacerbated by pain and stress. Managed with Valsartan .  Edema of lower extremities Edema. Furosemide  prescribed as needed for edema. Spironolactone  listed as a medication, but not specifically indicated for edema management.  Elevated bilirubin, likely due to Gilbert's syndrome, under surveillance Elevated bilirubin likely due to Gilbert's syndrome. No liver abnormalities on CT scan. - Continue monitoring bilirubin levels to assess trend.  Depression and anxiety, work-related, on medication and in therapy Work-related depression and anxiety managed with medication and therapy. Frequent anxiety attacks due to work stress. - Continue psychiatric and therapeutic interventions. - Document work-related stress impact on mental health for disability and workers' compensation claims.      No orders of the defined types  were placed in this encounter.  The patient has a good understanding of the overall plan. she agrees with it. she will call with any problems that may develop before the next visit here.  I personally spent a total of 30 minutes in the care of the patient today including preparing to see the patient, getting/reviewing separately obtained history, performing a medically appropriate exam/evaluation, counseling and educating, placing orders, referring and communicating with other health care professionals, documenting clinical information in the EHR, independently interpreting results, communicating results, and coordinating care.   Viinay K Jamari Moten, MD 12/11/23

## 2023-12-11 NOTE — Assessment & Plan Note (Signed)
 06/02/2020:Right lumpectomy ([facility]): invasive and in situ ductal carcinoma, 1.3cm, clear margins, 1 right axillary lymph node negative for carcinoma. ER 15% weak, PR-,HER-2 positive (3+) Ki67 80%.   Treatment plan: 1.  Adjuvant chemotherapy with Taxol  Herceptin  followed by Herceptin  maintenance.  Stopped after 11 cycles of Taxol  Herceptin  maintenance completed 07/07/2021  2.  Adjuvant radiation therapy 11/23/2020-12/19/2020 3.  Adjuvant antiestrogen therapy with tamoxifen  started October 2022 4.  CT angiogram 07/13/2021: Right supraclavicular lymph node 5 cm, right internal mammary lymph nodes 1.8 cm 5.  Metastatic breast cancer: June 2023:Right supraclavicular lymphadenopathy: Biopsy: Metastatic poorly differentiated adenocarcinoma, ER +40%, PR negative, HER2 positive 3+ PET CT scan 08/06/21: Ext Right Supra clav LN along with Internal mammary and Rt Upper Mediastinal and Rt Axillary LN  CT CAP 06/10/2023: Small supraclavicular lymph node on the left 7 mm, right axillary nodular thickening 1.5 cm overall stable disease, and remained in lung infiltrates. Echocardiogram based upon Dr. Nelle recommendation. -----------------------------------------------------------------------------------------------------  Current treatment: Enhertu  started on 08/21/2021 cycle 35, now every 5 weeks Enhertu  toxicities:   Hospitalization 04/29/2023-05/04/2023: Community-acquired pneumonia Chemo-induced peripheral neuropathy Bilirubin elevated at 1.9, up from 1.4 and 1.7  Hypertension issues Swelling of hands and feet possibly related to hypertension   CT CAP 10/28/23: No evidence of lymphadenopathy or metastatic disease  Return to clinic every 5 weeks for Enhertu 

## 2023-12-19 ENCOUNTER — Encounter: Payer: Self-pay | Admitting: Licensed Clinical Social Worker

## 2023-12-19 ENCOUNTER — Telehealth: Payer: Self-pay | Admitting: *Deleted

## 2023-12-19 NOTE — Progress Notes (Signed)
 [facility] CSW Progress Note  Clinical Child psychotherapist contacted patient by phone to follow-up on need for community resources.    Interventions: Notified patient of approval for grant from [employer] and [employer] and process for payment    Patient updated CSW that she is going to take leave from work as it has become too difficult between physical pain after workplace injury as well as the environment becoming more toxic.     Follow Up Plan:  Patient will contact CSW with any support or resource needs    Mohd Clemons E Dirck Butch, LCSW Clinical Social Worker [facility]

## 2023-12-19 NOTE — Telephone Encounter (Signed)
 Received call from pt stating she will be submitting for short term disability.  RN provided pt with number for FMLA/Disability department.

## 2023-12-30 ENCOUNTER — Other Ambulatory Visit: Payer: Self-pay | Admitting: Internal Medicine

## 2024-01-14 MED FILL — Fosaprepitant Dimeglumine For IV Infusion 150 MG (Base Eq): INTRAVENOUS | Qty: 5 | Status: AC

## 2024-01-14 NOTE — Assessment & Plan Note (Signed)
 06/02/2020:Right lumpectomy ([facility]): invasive and in situ ductal carcinoma, 1.3cm, clear margins, 1 right axillary lymph node negative for carcinoma. ER 15% weak, PR-,HER-2 positive (3+) Ki67 80%.   Treatment plan: 1.  Adjuvant chemotherapy with Taxol  Herceptin  followed by Herceptin  maintenance.  Stopped after 11 cycles of Taxol  Herceptin  maintenance completed 07/07/2021  2.  Adjuvant radiation therapy 11/23/2020-12/19/2020 3.  Adjuvant antiestrogen therapy with tamoxifen  started October 2022 4.  CT angiogram 07/13/2021: Right supraclavicular lymph node 5 cm, right internal mammary lymph nodes 1.8 cm 5.  Metastatic breast cancer: June 2023:Right supraclavicular lymphadenopathy: Biopsy: Metastatic poorly differentiated adenocarcinoma, ER +40%, PR negative, HER2 positive 3+ PET CT scan 08/06/21: Ext Right Supra clav LN along with Internal mammary and Rt Upper Mediastinal and Rt Axillary LN  CT CAP 06/10/2023: Small supraclavicular lymph node on the left 7 mm, right axillary nodular thickening 1.5 cm overall stable disease, and remained in lung infiltrates. Echocardiogram based upon Dr. Nelle recommendation. -----------------------------------------------------------------------------------------------------  Current treatment: Enhertu  started on 08/21/2021 cycle 36, now every 5 weeks Enhertu  toxicities:   Hospitalization 04/29/2023-05/04/2023: Community-acquired pneumonia Chemo-induced peripheral neuropathy Bilirubin elevated at 1.9, up from 1.4 and 1.7  Hypertension issues Swelling of hands and feet possibly related to hypertension   Patient fell at work and is seriously injured her shoulder as well as bilateral knees. She is unable to sit or stand for any length of time. 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   CT CAP 10/28/23: No evidence of lymphadenopathy or metastatic disease   Return to clinic every 5 weeks for Enhertu 

## 2024-01-15 ENCOUNTER — Inpatient Hospital Stay: Admitting: Hematology and Oncology

## 2024-01-15 ENCOUNTER — Encounter: Payer: Self-pay | Admitting: *Deleted

## 2024-01-15 ENCOUNTER — Inpatient Hospital Stay: Attending: Hematology and Oncology

## 2024-01-15 ENCOUNTER — Inpatient Hospital Stay

## 2024-01-15 VITALS — BP 188/88 | HR 86 | Temp 97.7°F | Resp 86 | Ht 60.0 in | Wt 326.3 lb

## 2024-01-15 VITALS — BP 179/89 | HR 94 | Resp 18

## 2024-01-15 DIAGNOSIS — Z170 Estrogen receptor positive status [ER+]: Secondary | ICD-10-CM | POA: Diagnosis not present

## 2024-01-15 DIAGNOSIS — Z7982 Long term (current) use of aspirin: Secondary | ICD-10-CM | POA: Insufficient documentation

## 2024-01-15 DIAGNOSIS — Z5112 Encounter for antineoplastic immunotherapy: Secondary | ICD-10-CM | POA: Insufficient documentation

## 2024-01-15 DIAGNOSIS — R110 Nausea: Secondary | ICD-10-CM | POA: Insufficient documentation

## 2024-01-15 DIAGNOSIS — R5383 Other fatigue: Secondary | ICD-10-CM | POA: Diagnosis not present

## 2024-01-15 DIAGNOSIS — C50919 Malignant neoplasm of unspecified site of unspecified female breast: Secondary | ICD-10-CM

## 2024-01-15 DIAGNOSIS — F410 Panic disorder [episodic paroxysmal anxiety] without agoraphobia: Secondary | ICD-10-CM | POA: Insufficient documentation

## 2024-01-15 DIAGNOSIS — M25552 Pain in left hip: Secondary | ICD-10-CM | POA: Diagnosis not present

## 2024-01-15 DIAGNOSIS — Z888 Allergy status to other drugs, medicaments and biological substances status: Secondary | ICD-10-CM | POA: Diagnosis not present

## 2024-01-15 DIAGNOSIS — Z1722 Progesterone receptor negative status: Secondary | ICD-10-CM | POA: Insufficient documentation

## 2024-01-15 DIAGNOSIS — Z95828 Presence of other vascular implants and grafts: Secondary | ICD-10-CM

## 2024-01-15 DIAGNOSIS — C50511 Malignant neoplasm of lower-outer quadrant of right female breast: Secondary | ICD-10-CM | POA: Insufficient documentation

## 2024-01-15 DIAGNOSIS — I517 Cardiomegaly: Secondary | ICD-10-CM | POA: Diagnosis not present

## 2024-01-15 DIAGNOSIS — G4700 Insomnia, unspecified: Secondary | ICD-10-CM | POA: Insufficient documentation

## 2024-01-15 DIAGNOSIS — T451X5A Adverse effect of antineoplastic and immunosuppressive drugs, initial encounter: Secondary | ICD-10-CM | POA: Diagnosis not present

## 2024-01-15 DIAGNOSIS — M25511 Pain in right shoulder: Secondary | ICD-10-CM | POA: Diagnosis not present

## 2024-01-15 DIAGNOSIS — Z1731 Human epidermal growth factor receptor 2 positive status: Secondary | ICD-10-CM | POA: Insufficient documentation

## 2024-01-15 DIAGNOSIS — G620 Drug-induced polyneuropathy: Secondary | ICD-10-CM | POA: Insufficient documentation

## 2024-01-15 DIAGNOSIS — Z79899 Other long term (current) drug therapy: Secondary | ICD-10-CM | POA: Insufficient documentation

## 2024-01-15 DIAGNOSIS — Z885 Allergy status to narcotic agent status: Secondary | ICD-10-CM | POA: Diagnosis not present

## 2024-01-15 DIAGNOSIS — Z883 Allergy status to other anti-infective agents status: Secondary | ICD-10-CM | POA: Diagnosis not present

## 2024-01-15 DIAGNOSIS — I700 Atherosclerosis of aorta: Secondary | ICD-10-CM | POA: Diagnosis not present

## 2024-01-15 DIAGNOSIS — M25551 Pain in right hip: Secondary | ICD-10-CM | POA: Insufficient documentation

## 2024-01-15 DIAGNOSIS — F32A Depression, unspecified: Secondary | ICD-10-CM | POA: Insufficient documentation

## 2024-01-15 DIAGNOSIS — M170 Bilateral primary osteoarthritis of knee: Secondary | ICD-10-CM | POA: Diagnosis not present

## 2024-01-15 DIAGNOSIS — Z91040 Latex allergy status: Secondary | ICD-10-CM | POA: Diagnosis not present

## 2024-01-15 LAB — CBC WITH DIFFERENTIAL (CANCER CENTER ONLY)
Abs Immature Granulocytes: 0.02 K/uL (ref 0.00–0.07)
Basophils Absolute: 0 K/uL (ref 0.0–0.1)
Basophils Relative: 1 %
Eosinophils Absolute: 0.2 K/uL (ref 0.0–0.5)
Eosinophils Relative: 3 %
HCT: 37.1 % (ref 36.0–46.0)
Hemoglobin: 12.3 g/dL (ref 12.0–15.0)
Immature Granulocytes: 0 %
Lymphocytes Relative: 19 %
Lymphs Abs: 1 K/uL (ref 0.7–4.0)
MCH: 27.5 pg (ref 26.0–34.0)
MCHC: 33.2 g/dL (ref 30.0–36.0)
MCV: 82.8 fL (ref 80.0–100.0)
Monocytes Absolute: 0.7 K/uL (ref 0.1–1.0)
Monocytes Relative: 13 %
Neutro Abs: 3.4 K/uL (ref 1.7–7.7)
Neutrophils Relative %: 64 %
Platelet Count: 115 K/uL — ABNORMAL LOW (ref 150–400)
RBC: 4.48 MIL/uL (ref 3.87–5.11)
RDW: 15.9 % — ABNORMAL HIGH (ref 11.5–15.5)
WBC Count: 5.4 K/uL (ref 4.0–10.5)
nRBC: 0 % (ref 0.0–0.2)

## 2024-01-15 LAB — CMP (CANCER CENTER ONLY)
ALT: 19 U/L (ref 0–44)
AST: 33 U/L (ref 15–41)
Albumin: 3.4 g/dL — ABNORMAL LOW (ref 3.5–5.0)
Alkaline Phosphatase: 113 U/L (ref 38–126)
Anion gap: 5 (ref 5–15)
BUN: 7 mg/dL (ref 6–20)
CO2: 29 mmol/L (ref 22–32)
Calcium: 9 mg/dL (ref 8.9–10.3)
Chloride: 106 mmol/L (ref 98–111)
Creatinine: 0.68 mg/dL (ref 0.44–1.00)
GFR, Estimated: >60 mL/min (ref >60)
Glucose, Bld: 100 mg/dL — ABNORMAL HIGH (ref 70–99)
Potassium: 3.3 mmol/L — ABNORMAL LOW (ref 3.5–5.1)
Sodium: 140 mmol/L (ref 135–145)
Total Bilirubin: 1.7 mg/dL — ABNORMAL HIGH (ref 0.0–1.2)
Total Protein: 6.6 g/dL (ref 6.5–8.1)

## 2024-01-15 MED ORDER — SODIUM CHLORIDE 0.9 % IV SOLN
150.0000 mg | Freq: Once | INTRAVENOUS | Status: AC
  Administered 2024-01-15: 150 mg via INTRAVENOUS
  Filled 2024-01-15: qty 150

## 2024-01-15 MED ORDER — CYANOCOBALAMIN 1000 MCG/ML IJ SOLN
1000.0000 ug | Freq: Once | INTRAMUSCULAR | Status: AC
  Administered 2024-01-15: 1000 ug via INTRAMUSCULAR
  Filled 2024-01-15: qty 1

## 2024-01-15 MED ORDER — FAM-TRASTUZUMAB DERUXTECAN-NXKI CHEMO 100 MG IV SOLR
300.0000 mg | Freq: Once | INTRAVENOUS | Status: AC
  Administered 2024-01-15: 300 mg via INTRAVENOUS
  Filled 2024-01-15: qty 15

## 2024-01-15 MED ORDER — PALONOSETRON HCL INJECTION 0.25 MG/5ML
0.2500 mg | Freq: Once | INTRAVENOUS | Status: AC
  Administered 2024-01-15: 0.25 mg via INTRAVENOUS
  Filled 2024-01-15: qty 5

## 2024-01-15 MED ORDER — DEXTROSE 5 % IV SOLN: Freq: Once | INTRAVENOUS | Status: AC

## 2024-01-15 MED ORDER — ACETAMINOPHEN 325 MG PO TABS
650.0000 mg | ORAL_TABLET | Freq: Once | ORAL | Status: AC
  Administered 2024-01-15: 650 mg via ORAL
  Filled 2024-01-15: qty 2

## 2024-01-15 MED ORDER — SODIUM CHLORIDE 0.9 % IV SOLN: Freq: Once | INTRAVENOUS | Status: AC

## 2024-01-15 NOTE — Progress Notes (Signed)
 Per pt request, updated work letter emailed to pt email address.

## 2024-01-15 NOTE — Patient Instructions (Signed)
 [facility] - A DEPT OF [facility]  Discharge Instructions: Thank you for choosing [facility] to provide your oncology and hematology care.   If you have a lab appointment with the Cancer Center, please go directly to the Cancer Center and check in at the registration area.   Wear comfortable clothing and clothing appropriate for easy access to any Portacath or PICC line.   We strive to give you quality time with your provider. You may need to reschedule your appointment if you arrive late (15 or more minutes).  Arriving late affects you and other patients whose appointments are after yours.  Also, if you miss three or more appointments without notifying the office, you may be dismissed from the clinic at the provider's discretion.      For prescription refill requests, have your pharmacy contact our office and allow 72 hours for refills to be completed.    Today you received the following chemotherapy and/or immunotherapy agents Enhertu      To help prevent nausea and vomiting after your treatment, we encourage you to take your nausea medication as directed.  BELOW ARE SYMPTOMS THAT SHOULD BE REPORTED IMMEDIATELY: *FEVER GREATER THAN 100.4 F (38 C) OR HIGHER *CHILLS OR SWEATING *NAUSEA AND VOMITING THAT IS NOT CONTROLLED WITH YOUR NAUSEA MEDICATION *UNUSUAL SHORTNESS OF BREATH *UNUSUAL BRUISING OR BLEEDING *URINARY PROBLEMS (pain or burning when urinating, or frequent urination) *BOWEL PROBLEMS (unusual diarrhea, constipation, pain near the anus) TENDERNESS IN MOUTH AND THROAT WITH OR WITHOUT PRESENCE OF ULCERS (sore throat, sores in mouth, or a toothache) UNUSUAL RASH, SWELLING OR PAIN  UNUSUAL VAGINAL DISCHARGE OR ITCHING   Items with * indicate a potential emergency and should be followed up as soon as possible or go to the Emergency Department if any problems should occur.  Please show the CHEMOTHERAPY ALERT CARD or IMMUNOTHERAPY  ALERT CARD at check-in to the Emergency Department and triage nurse.  Should you have questions after your visit or need to cancel or reschedule your appointment, please contact [facility] - A DEPT OF [facility]  Dept: [phone]  and follow the prompts.  Office hours are 8:00 a.m. to 4:30 p.m. Monday - Friday. Please note that voicemails left after 4:00 p.m. may not be returned until the following business day.  We are closed weekends and major holidays. You have access to a nurse at all times for urgent questions. Please call the main number to the clinic Dept: [phone] and follow the prompts.   For any non-urgent questions, you may also contact your provider using MyChart. We now offer e-Visits for anyone 19 and older to request care online for non-urgent symptoms. For details visit [email or web address].   Also download the MyChart app! Go to the app store, search "MyChart", open the app, select [facility], and log in with your MyChart username and password.

## 2024-01-15 NOTE — Progress Notes (Signed)
 Patient Care Team: [name], MD as PCP - General (Internal Medicine) Vernetta Lonni GRADE, MD as Consulting Physician (Orthopedic Surgery) Tyree Nanetta SAILOR, RN as Oncology Nurse Navigator Vanderbilt Ned, MD as Consulting Physician (General Surgery) Odean Potts, MD as Consulting Physician (Hematology and Oncology) Izell Domino, MD as Attending Physician (Radiation Oncology)  DIAGNOSIS:  Encounter Diagnosis  Name Primary?   Malignant neoplasm of lower-outer quadrant of right breast of female, estrogen receptor positive (HCC) Yes    SUMMARY OF ONCOLOGIC HISTORY: Oncology History  Malignant neoplasm of lower-outer quadrant of right breast of female, estrogen receptor positive (HCC)  05/03/2020 Initial Diagnosis   Screening mammogram showed a 1.0cm mass at the 6 o'clock position in the right breast. Diagnostic mammogram and US  showed the 1.2cm mass at the 6 o'clock position in the right breast. Biopsy showed IDC, grade 3, HER-2 positive (3+), ER 15% weak, PR-, Ki67 80%.   05/19/2020 Genetic Testing   Negative hereditary cancer genetic testing: no pathogenic variants detected in [facility] Breast STAT Panel and Common Hereditary Cancers Panel.  The report dates are May 19, 2020 (STAT) and May 23, 2020 (Common Hereditary).   The Common Hereditary Cancers Panel offered by [facility] includes sequencing and/or deletion duplication testing of the following 47 genes: APC, ATM, AXIN2, BARD1, BMPR1A, BRCA1, BRCA2, BRIP1, CDH1, CDK4, CDKN2A (p14ARF), CDKN2A (p16INK4a), CHEK2, CTNNA1, DICER1, EPCAM (Deletion/duplication testing only), GREM1 (promoter region deletion/duplication testing only), GREM1, HOXB13, KIT, MEN1, MLH1, MSH2, MSH3, MSH6, MUTYH, NBN, NF1, NHTL1, PALB2, PDGFRA, PMS2, POLD1, POLE, PTEN, RAD50, RAD51C, RAD51D, SDHA, SDHB, SDHC, SDHD, SMAD4, SMARCA4. STK11, TP53, TSC1, TSC2, and VHL.  The following genes were evaluated for sequence changes only: SDHA and HOXB13 c.251G>A variant  only.es only: SDHA and HOXB13 c.251G>A variant only.   06/02/2020 Surgery   Right lumpectomy ([facility]): invasive and in situ ductal carcinoma, 1.3cm, clear margins, 1 right axillary lymph node negative for carcinoma.   06/02/2020 Cancer Staging   Staging form: Breast, AJCC 8th Edition - Pathologic stage from 06/02/2020: Stage IA (pT1c, pN0, cM0, G3, ER+, PR-, HER2+) - Signed by Crawford Morna Pickle, NP on 06/15/2020 Stage prefix: Initial diagnosis Histologic grading system: 3 grade system   07/08/2020 - 10/21/2020 Chemotherapy   Taxol  Herceptin  followed by Herceptin  maintenance   04/14/2021 -  Anti-estrogen oral therapy   Tamoxifen  10 mg   07/13/2021 Imaging   CT angiogram: Right supraclavicular lymph node 5 cm, right internal mammary lymph nodes 1.8 cm    08/06/2021 PET scan   PET CT scan: Ext Right Supra clav LN along with Internal mammary and Rt Upper Mediastinal and Rt Axillary LN   08/2021 Initial Biopsy   Right supraclavicular lymphadenopathy: Biopsy: Metastatic poorly differentiated adenocarcinoma, ER +40%, PR negative, HER2 positive 3+    08/21/2021 -  Chemotherapy   Enhertu  every 3 weeks    03/08/2022 Imaging   CT chest/abdomen/pelvis  1. No new or progressive findings in the chest, abdomen or pelvis. 2. Signs of RIGHT breast lumpectomy with areas of central fat necrosis unchanged from previous imaging. 3. Stable small lymph nodes throughout the chest, largest with area of soft tissue about surgical clips in the RIGHT axilla. 4. Stable appearance of thoracic inlet lymph nodes, not shown to be hypermetabolic on prior PET imaging. 5. IUD in-situ. 6. Aortic atherosclerosis.   08/21/2022 Imaging   Echocardiogram  -normal LVEF at 60-65%. -no left ventricular wall motion abnormalities  -normal left ventricular diastolic function  -mild left ventricular hypertrophy.    10/05/2022  Imaging   CT Chest, Abdomen, and Pelvis with contrast IMPRESSION: 1. Stable examination without  new or progressive finding in the chest, abdomen or pelvis to suggest recurrent or metastatic disease. 2. Stable tiny bilateral thoracic inlet lymph nodes. 3.  Aortic Atherosclerosis (ICD10-I70.0).     CHIEF COMPLIANT: Follow-up on Enhertu   HISTORY OF PRESENT ILLNESS:  History of Present Illness Pamela [name] is a 58 year old female who presents with ongoing pain management issues and work-related stress.  She experiences persistent pain in her bones, shoulders, hips, and knees, with significant swelling in her leg. The pain limits her ability to sit for extended periods. Her right shoulder pain is severe, preventing her from laying on it, and she is considering an emergency room visit due to the severity.  She has significant stress related to her work situation, having experienced three anxiety attacks within four hours in September. This stress has led her to consider not returning to work. She is on medications for anxiety and depression, which have been adjusted due to pain and sleep disturbances. She takes lorazepam  to aid sleep but continues to have difficulty sleeping and often feels exhausted after treatments.  She is prescribed gabapentin, which she plans to pick up today. She experiences nausea and sleep disturbances. She has stopped drinking alcohol due to nausea and the unpredictability of her treatment schedule.     ALLERGIES:  is allergic to pollen extract, compazine  [prochlorperazine ], latex, codeine, diflucan  [fluconazole ], shellfish allergy, and betadine [povidone iodine].  MEDICATIONS:  Current Outpatient Medications  Medication Sig Dispense Refill   acetaminophen  (TYLENOL ) 500 MG tablet Take 1 tablet (500 mg total) by mouth every 6 (six) hours as needed. 30 tablet 0   albuterol  (VENTOLIN  HFA) 108 (90 Base) MCG/ACT inhaler Inhale 2 puffs into the lungs every 6 (six) hours as needed for wheezing or shortness of breath. 8 g 2   aspirin  EC 81 MG tablet Take 1 tablet  (81 mg total) by mouth daily. Swallow whole. 30 tablet 11   escitalopram (LEXAPRO) 10 MG tablet Take 10 mg by mouth daily. Take one tablet by mouth once daily.     furosemide  (LASIX ) 20 MG tablet Take 1 tablet (20 mg total) by mouth as needed. TAKE 1 TABLET AS NEEDED FOR SWELLING OR SHORTNESS OF BREATH OR WEIGHT GAIN. 3 POUNDS WITHIN 24HR OR 5 POUNDS 7 DAYS 90 tablet 0   HYDROcodone -acetaminophen  (NORCO/VICODIN) 5-325 MG tablet Take 1 tablet by mouth.     levonorgestrel  (MIRENA , 52 MG,) 20 MCG/DAY IUD 1 each by Intrauterine route once.     lidocaine -prilocaine  (EMLA ) cream Apply to affected area once AS DIRECTED 30 g 3   linaclotide  (LINZESS ) 145 MCG CAPS capsule One cap po qd     LORazepam  (ATIVAN ) 0.5 MG tablet Take 0.5 mg by mouth daily. Take one tablet by mouth once daily as needed.     meloxicam (MOBIC) 7.5 MG tablet Take 1 tablet (7.5 mg total) by mouth daily. 30 tablet 3   potassium chloride  (KLOR-CON  M) 10 MEQ tablet Take 2 tablets (20 mEq total) by mouth 2 (two) times daily. 60 tablet 6   rosuvastatin  (CRESTOR ) 40 MG tablet Take 1 tablet (40 mg total) by mouth daily. 90 tablet 3   semaglutide -weight management (WEGOVY ) 0.5 MG/0.5ML SOAJ SQ injection Inject 0.5 mg into the skin once a week. 2 mL 0   Spacer/Aero-Holding Chambers (AEROCHAMBER PLUS) Device Please dispense spacer for inhaler use 1 each 0   spironolactone  (ALDACTONE ) 25  MG tablet Take 1 tablet (25 mg total) by mouth daily. 30 tablet 6   valsartan  (DIOVAN ) 160 MG tablet Take 1 tablet (160 mg total) by mouth daily. 30 tablet 11   venlafaxine  XR (EFFEXOR -XR) 37.5 MG 24 hr capsule Take 37.5 mg by mouth every morning.     No current facility-administered medications for this visit.   Facility-Administered Medications Ordered in Other Visits  Medication Dose Route Frequency Provider Last Rate Last Admin   0.9 %  sodium chloride  infusion   Intravenous Once Odean Potts, MD 500 mL/hr at 01/15/24 1039 New Bag at 01/15/24 1039    fam-trastuzumab deruxtecan-nxki  (ENHERTU ) 300 mg in dextrose  5 % 100 mL chemo infusion  300 mg Intravenous Once Odean Potts, MD       heparin  lock flush 100 unit/mL  500 Units Intracatheter Once Odean Potts, MD       sodium chloride  flush (NS) 0.9 % injection 10 mL  10 mL Intracatheter Once Odean Potts, MD        PHYSICAL EXAMINATION: ECOG PERFORMANCE STATUS: 1 - Symptomatic but completely ambulatory  Vitals:   01/15/24 1000  BP: (!) 188/88  Pulse: 86  Resp: (!) 86  Temp: 97.7 F (36.5 C)  SpO2: 100%   Filed Weights   01/15/24 1000  Weight: (!) 326 lb 4.8 oz (148 kg)     LABORATORY DATA:  I have reviewed the data as listed    Latest Ref Rng & Units 01/15/2024    9:22 AM 12/11/2023    9:45 AM 11/28/2023   12:35 PM  CMP  Glucose 70 - 99 mg/dL 899  886  95   BUN 6 - 20 mg/dL 7  8  5    Creatinine 0.44 - 1.00 mg/dL 9.31  9.37  9.29   Sodium 135 - 145 mmol/L 140  140  141   Potassium 3.5 - 5.1 mmol/L 3.3  3.5  3.4   Chloride 98 - 111 mmol/L 106  109  107   CO2 22 - 32 mmol/L 29  27  29    Calcium  8.9 - 10.3 mg/dL 9.0  9.0  9.0   Total Protein 6.5 - 8.1 g/dL 6.6  6.6  6.5   Total Bilirubin 0.0 - 1.2 mg/dL 1.7  1.9  2.4   Alkaline Phos 38 - 126 U/L 113  104  110   AST 15 - 41 U/L 33  39  39   ALT 0 - 44 U/L 19  22  21      Lab Results  Component Value Date   WBC 5.4 01/15/2024   HGB 12.3 01/15/2024   HCT 37.1 01/15/2024   MCV 82.8 01/15/2024   PLT 115 (L) 01/15/2024   NEUTROABS 3.4 01/15/2024    ASSESSMENT & PLAN:  Malignant neoplasm of lower-outer quadrant of right breast of female, estrogen receptor positive (HCC) 06/02/2020:Right lumpectomy ([facility]): invasive and in situ ductal carcinoma, 1.3cm, clear margins, 1 right axillary lymph node negative for carcinoma. ER 15% weak, PR-,HER-2 positive (3+) Ki67 80%.   Treatment plan: 1.  Adjuvant chemotherapy with Taxol  Herceptin  followed by Herceptin  maintenance.  Stopped after 11 cycles of Taxol  Herceptin  maintenance  completed 07/07/2021  2.  Adjuvant radiation therapy 11/23/2020-12/19/2020 3.  Adjuvant antiestrogen therapy with tamoxifen  started October 2022 4.  CT angiogram 07/13/2021: Right supraclavicular lymph node 5 cm, right internal mammary lymph nodes 1.8 cm 5.  Metastatic breast cancer: June 2023:Right supraclavicular lymphadenopathy: Biopsy: Metastatic poorly differentiated adenocarcinoma, ER +40%, PR negative,  HER2 positive 3+ PET CT scan 08/06/21: Ext Right Supra clav LN along with Internal mammary and Rt Upper Mediastinal and Rt Axillary LN  CT CAP 06/10/2023: Small supraclavicular lymph node on the left 7 mm, right axillary nodular thickening 1.5 cm overall stable disease, and remained in lung infiltrates. Echocardiogram based upon Dr. Nelle recommendation. -----------------------------------------------------------------------------------------------------  Current treatment: Enhertu  started on 08/21/2021 cycle 36, now every 5 weeks Enhertu  toxicities:   Hospitalization 04/29/2023-05/04/2023: Community-acquired pneumonia Chemo-induced peripheral neuropathy Bilirubin elevated at 1.9, up from 1.4 and 1.7  Hypertension issues Swelling of hands and feet possibly related to hypertension   Patient fell at work and is seriously injured her shoulder as well as bilateral knees. She is unable to sit or stand for any length of time. 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  She is unable to work both because of the recent injuries but also secondary to her metastatic cancer and side effects of chemotherapy.  She also has profound anxiety and mental health issues that complicate her ability to work.   CT CAP 10/28/23: No evidence of lymphadenopathy or metastatic disease   Return to clinic every 5 weeks for Enhertu  ------------------------------------- Assessment and Plan Assessment & Plan Malignant neoplasm of lower-outer quadrant of right breast,  ER+ Labs stable, no treatment-related complications. Alcohol consumption post-treatment not concerning. - Continue current treatment regimen.  Bilateral knee osteoarthritis with pain and planned nerve block injections Nerve block injections planned, with potential for nerve ablation if ineffective. - Prescribed gabapentin for pain management. - Proceed with nerve block injections. - Consider nerve ablation if injections are ineffective.  Right shoulder pain Persistent pain managed with hydrocodone  and acetaminophen . - Continue pain management with hydrocodone  and acetaminophen .  Edema of lower extremities  Depression and anxiety related to chronic pain and cancer treatment Depression and anxiety exacerbated by pain and treatment. Current medications include escitalopram, venlafaxine , and lorazepam . Increased anxiety medication dosage due to pain and sleep disturbances. - Continue current psychiatric medication regimen.      No orders of the defined types were placed in this encounter.  The patient has a good understanding of the overall plan. she agrees with it. she will call with any problems that may develop before the next visit here.  I personally spent a total of 45 minutes in the care of the patient today including preparing to see the patient, getting/reviewing separately obtained history, performing a medically appropriate exam/evaluation, counseling and educating, placing orders, referring and communicating with other health care professionals, documenting clinical information in the EHR, independently interpreting results, communicating results, and coordinating care.   Viinay K Evella Kasal, MD 01/15/24

## 2024-01-16 ENCOUNTER — Encounter: Payer: Self-pay | Admitting: *Deleted

## 2024-01-16 ENCOUNTER — Telehealth: Payer: Self-pay

## 2024-01-16 NOTE — Telephone Encounter (Signed)
 Return the pt call regarding her FMLA, wanting to know if we have received her forms in office. I informed her that we did. Pt verbalized understanding.

## 2024-01-21 ENCOUNTER — Telehealth: Payer: Self-pay

## 2024-01-21 NOTE — Telephone Encounter (Signed)
 Notified the pt regarding her forms being completed, faxed, and  confirmation received. Pt copy was emailed as requested.

## 2024-01-22 ENCOUNTER — Telehealth: Payer: Self-pay

## 2024-01-22 ENCOUNTER — Ambulatory Visit: Payer: Self-pay | Admitting: Internal Medicine

## 2024-01-22 ENCOUNTER — Encounter: Payer: Self-pay | Admitting: Internal Medicine

## 2024-01-22 VITALS — BP 148/90 | HR 76 | Temp 98.5°F | Ht 60.0 in | Wt 325.0 lb

## 2024-01-22 DIAGNOSIS — I700 Atherosclerosis of aorta: Secondary | ICD-10-CM | POA: Diagnosis not present

## 2024-01-22 DIAGNOSIS — M1712 Unilateral primary osteoarthritis, left knee: Secondary | ICD-10-CM | POA: Diagnosis not present

## 2024-01-22 DIAGNOSIS — F4323 Adjustment disorder with mixed anxiety and depressed mood: Secondary | ICD-10-CM

## 2024-01-22 DIAGNOSIS — I119 Hypertensive heart disease without heart failure: Secondary | ICD-10-CM | POA: Diagnosis not present

## 2024-01-22 LAB — LIPID PANEL
Chol/HDL Ratio: 3.2 ratio (ref 0.0–4.4)
Cholesterol, Total: 229 mg/dL — ABNORMAL HIGH (ref 100–199)
HDL: 71 mg/dL (ref >39)
LDL Chol Calc (NIH): 144 mg/dL — ABNORMAL HIGH (ref 0–99)
Triglycerides: 83 mg/dL (ref 0–149)
VLDL Cholesterol Cal: 14 mg/dL (ref 5–40)

## 2024-01-22 NOTE — Progress Notes (Signed)
 I,Victoria T Emmitt, CMA,acting as a neurosurgeon for [name], MD.,have documented all relevant documentation on the behalf of [name], MD,as directed by  [name], MD while in the presence of [name], MD.  Subjective:  Patient ID: Pamela [name] , female    DOB: 1965-07-08 , 58 y.o.   MRN: [id number]  Chief Complaint  Patient presents with   Hypertension    The patient is here today for a blood pressure f/u.  She reports compliance with meds. She denies headache, chest pain, dizziness, and SOB.  Letter sent to GYN for pap result.     HPI Discussed the use of AI scribe software for clinical note transcription with the patient, who gave verbal consent to proceed.  History of Present Illness Pamela [name] is a 58 year old female with hypertension and knee pain who presents for a blood pressure check and management of knee pain.  She has elevated blood pressure with home readings of 156/unknown and spikes to 170 when experiencing pain. She regularly monitors her blood pressure at home.  She experiences significant knee pain, described as 'bone on bone', and has not found relief from cortisone and gel injections. Attempts to walk without a crutch resulted in her leg giving out. She has been referred to a pain specialist who discussed nerve procedures for pain relief.  She has a history of a work-related knee injury on June 17th, which she reported to her supervisor. She experienced swelling and pain, requiring Tylenol  and elevation. Her workers' compensation claim was initially approved but later denied due to lack of witness, leading to legal involvement. She is in contact with her attorney and is awaiting further legal proceedings.  She is undergoing treatment for breast cancer, which causes fatigue and nausea.    Hypertension This is a chronic problem. The current episode started more than 1 year ago. The problem has been gradually improving since onset.  The problem is controlled. There are no associated agents to hypertension. Risk factors for coronary artery disease include obesity and sedentary lifestyle. Past treatments include diuretics and angiotensin blockers. There are no compliance problems.  There is no history of angina.     Past Medical History:  Diagnosis Date   Anxiety    Asthma    Back pain    Breast cancer (HCC)    Constipation    DUB (dysfunctional uterine bleeding) 2007   Edema of both lower extremities    Elevated cholesterol    Family history of breast cancer 05/12/2020   Family history of lung cancer 05/12/2020   Food allergy    Grade I diastolic dysfunction 04/30/2023   H/O menorrhagia 05/01/2006   High cholesterol    History of ovarian cyst 2007   Hypertension 03/31/2005   Increased BMI 07/11/2006   Joint pain    Migraine    N&V (nausea and vomiting)    Obesity 2007   Obstructive sleep apnea syndrome, moderate 10/27/2013   no cpap   Personal history of chemotherapy    Personal history of radiation therapy    Sleep apnea    SOB (shortness of breath)    Vitamin D  deficiency    Vitamin D  deficiency    Weight loss 07/11/2006     Family History  Problem Relation Age of Onset   Hypertension Mother    High Cholesterol Mother    Thyroid  disease Mother    Cancer Mother    Sleep apnea Mother  Hypertension Father    Heart attack Father    Sudden death Father    Diabetes Maternal Grandmother    Bone cancer Maternal Grandfather        dx after 58   Breast cancer Maternal Aunt        dx 30s   Lung cancer Maternal Aunt        dx after 50     Current Outpatient Medications:    acetaminophen  (TYLENOL ) 500 MG tablet, Take 1 tablet (500 mg total) by mouth every 6 (six) hours as needed., Disp: 30 tablet, Rfl: 0   albuterol  (VENTOLIN  HFA) 108 (90 Base) MCG/ACT inhaler, Inhale 2 puffs into the lungs every 6 (six) hours as needed for wheezing or shortness of breath., Disp: 8 g, Rfl: 2   aspirin  EC 81 MG  tablet, Take 1 tablet (81 mg total) by mouth daily. Swallow whole., Disp: 30 tablet, Rfl: 11   escitalopram (LEXAPRO) 10 MG tablet, Take 10 mg by mouth daily. Take one tablet by mouth once daily., Disp: , Rfl:    furosemide  (LASIX ) 20 MG tablet, Take 1 tablet (20 mg total) by mouth as needed. TAKE 1 TABLET AS NEEDED FOR SWELLING OR SHORTNESS OF BREATH OR WEIGHT GAIN. 3 POUNDS WITHIN 24HR OR 5 POUNDS 7 DAYS, Disp: 90 tablet, Rfl: 0   HYDROcodone -acetaminophen  (NORCO/VICODIN) 5-325 MG tablet, Take 1 tablet by mouth., Disp: , Rfl:    levonorgestrel  (MIRENA , 52 MG,) 20 MCG/DAY IUD, 1 each by Intrauterine route once., Disp: , Rfl:    lidocaine -prilocaine  (EMLA ) cream, Apply to affected area once AS DIRECTED, Disp: 30 g, Rfl: 3   linaclotide  (LINZESS ) 145 MCG CAPS capsule, One cap po qd, Disp: , Rfl:    LORazepam  (ATIVAN ) 0.5 MG tablet, Take 0.5 mg by mouth daily. Take one tablet by mouth once daily as needed. (Patient taking differently: Take 0.5 mg by mouth daily. Patient reports taking 2 tabs daily.), Disp: , Rfl:    meloxicam  (MOBIC ) 7.5 MG tablet, Take 1 tablet (7.5 mg total) by mouth daily., Disp: 30 tablet, Rfl: 3   potassium chloride  (KLOR-CON  M) 10 MEQ tablet, Take 2 tablets (20 mEq total) by mouth 2 (two) times daily., Disp: 60 tablet, Rfl: 6   rosuvastatin  (CRESTOR ) 40 MG tablet, Take 1 tablet (40 mg total) by mouth daily., Disp: 90 tablet, Rfl: 3   semaglutide -weight management (WEGOVY ) 0.5 MG/0.5ML SOAJ SQ injection, Inject 0.5 mg into the skin once a week., Disp: 2 mL, Rfl: 0   Spacer/Aero-Holding Chambers (AEROCHAMBER PLUS) Device, Please dispense spacer for inhaler use, Disp: 1 each, Rfl: 0   spironolactone  (ALDACTONE ) 25 MG tablet, Take 1 tablet (25 mg total) by mouth daily., Disp: 30 tablet, Rfl: 6   venlafaxine  XR (EFFEXOR -XR) 37.5 MG 24 hr capsule, Take 37.5 mg by mouth every morning., Disp: , Rfl:    valsartan  (DIOVAN ) 160 MG tablet, Take 1 tablet (160 mg total) by mouth daily., Disp:  30 tablet, Rfl: 11 No current facility-administered medications for this visit.  Facility-Administered Medications Ordered in Other Visits:    heparin  lock flush 100 unit/mL, 500 Units, Intracatheter, Once, Odean Potts, MD   sodium chloride  flush (NS) 0.9 % injection 10 mL, 10 mL, Intracatheter, Once, Gudena, Vinay, MD   Allergies  Allergen Reactions   Pollen Extract Shortness Of Breath   Compazine  [Prochlorperazine ] Anxiety    IV compazine  causes severe anxiety attack   Latex Rash   Codeine Hives   Diflucan  [Fluconazole ] Hives  Shellfish Allergy Hives   Betadine [Povidone Iodine] Rash     Review of Systems  Constitutional: Negative.   Respiratory: Negative.    Cardiovascular: Negative.   Gastrointestinal: Negative.   Musculoskeletal:  Positive for arthralgias.  Neurological: Negative.   Psychiatric/Behavioral: Negative.       Today's Vitals   01/22/24 1145 01/22/24 1223  BP: (!) 160/100 (!) 148/90  Pulse: 76   Temp: 98.5 F (36.9 C)   SpO2: 98%   Weight: (!) 325 lb (147.4 kg)   Height: 5' (1.524 m)    Body mass index is 63.47 kg/m.  Wt Readings from Last 3 Encounters:  01/22/24 (!) 325 lb (147.4 kg)  01/15/24 (!) 326 lb 4.8 oz (148 kg)  12/11/23 (!) 322 lb 9.6 oz (146.3 kg)    BP Readings from Last 3 Encounters:  01/22/24 (!) 148/90  01/15/24 (!) 179/89  01/15/24 (!) 188/88    Objective:  Physical Exam Vitals and nursing note reviewed.  Constitutional:      Appearance: Normal appearance. She is obese.  HENT:     Head: Normocephalic and atraumatic.  Eyes:     Extraocular Movements: Extraocular movements intact.  Cardiovascular:     Rate and Rhythm: Normal rate and regular rhythm.     Heart sounds: Normal heart sounds.  Pulmonary:     Effort: Pulmonary effort is normal.     Breath sounds: Normal breath sounds.  Musculoskeletal:     Cervical back: Normal range of motion.     Comments: Ambulatory with cane  Skin:    General: Skin is warm.   Neurological:     General: No focal deficit present.     Mental Status: She is alert.  Psychiatric:        Mood and Affect: Mood normal.        Behavior: Behavior normal.         Assessment And Plan:  Hypertensive heart disease without heart failure Assessment & Plan: Blood pressure elevated at 148/90 mmHg, with home systolic readings up to 170 mmHg, likely exacerbated by knee pain. Hypertension managed with valsartan , spironolactone , and furosemide .  - Continue current antihypertensive regimen. - Monitor blood pressure. - Follow low sodium diet   Orders: -     Lipid panel  Aortic atherosclerosis Assessment & Plan: Chronic, LDL goal is less than 70.  She will continue with rosuvastatin  daily.   Orders: -     Lipid panel  Adjustment disorder with mixed anxiety and depressed mood Assessment & Plan: Depression likely worsened by work-related stress. Under psychiatric care with venlafaxine , lorazepam , and Lexapro. Engaged in counseling and monthly psychiatric consultations. Oncologist approved continuation of psychiatric medications. - Maintain current psychiatric medications. - Continue weekly counseling sessions. - Monthly follow-up with psychiatrist.   Osteoarthritis of left knee, unspecified osteoarthritis type Assessment & Plan: Persistent pain post-fall, unrelieved by cortisone and gel injections. Orthopedic consultation suggested weight loss before knee replacement. Pain specialist recommended nerve injection pending insurance approval. Workman's compensation claim denied, legal consultation ongoing. - Continue pain management as per pain specialist's recommendation. - Follow up with attorney regarding workman's compensation claim.   Other orders -     Valsartan ; Take 1 tablet (160 mg total) by mouth daily.  Dispense: 30 tablet; Refill: 11   Return for 4 month bpc.  Patient was given opportunity to ask questions. Patient verbalized understanding of the plan and  was able to repeat key elements of the plan. All questions were answered to their  satisfaction.   I, [name], MD, have reviewed all documentation for this visit. The documentation on 01/22/24 for the exam, diagnosis, procedures, and orders are all accurate and complete.   IF YOU HAVE BEEN REFERRED TO A SPECIALIST, IT MAY TAKE 1-2 WEEKS TO SCHEDULE/PROCESS THE REFERRAL. IF YOU HAVE NOT HEARD FROM US /SPECIALIST IN TWO WEEKS, PLEASE GIVE US  A CALL AT [phone] X [phone].   THE PATIENT IS ENCOURAGED TO PRACTICE SOCIAL DISTANCING DUE TO THE COVID-19 PANDEMIC.

## 2024-01-22 NOTE — Patient Instructions (Signed)
 Hypertension, Adult Hypertension is another name for high blood pressure. High blood pressure forces your heart to work harder to pump blood. This can cause problems over time. There are two numbers in a blood pressure reading. There is a top number (systolic) over a bottom number (diastolic). It is best to have a blood pressure that is below 120/80. What are the causes? The cause of this condition is not known. Some other conditions can lead to high blood pressure. What increases the risk? Some lifestyle factors can make you more likely to develop high blood pressure: Smoking. Not getting enough exercise or physical activity. Being overweight. Having too much fat, sugar, calories, or salt (sodium) in your diet. Drinking too much alcohol. Other risk factors include: Having any of these conditions: Heart disease. Diabetes. High cholesterol. Kidney disease. Obstructive sleep apnea. Having a family history of high blood pressure and high cholesterol. Age. The risk increases with age. Stress. What are the signs or symptoms? High blood pressure may not cause symptoms. Very high blood pressure (hypertensive crisis) may cause: Headache. Fast or uneven heartbeats (palpitations). Shortness of breath. Nosebleed. Vomiting or feeling like you may vomit (nauseous). Changes in how you see. Very bad chest pain. Feeling dizzy. Seizures. How is this treated? This condition is treated by making healthy lifestyle changes, such as: Eating healthy foods. Exercising more. Drinking less alcohol. Your doctor may prescribe medicine if lifestyle changes do not help enough and if: Your top number is above 130. Your bottom number is above 80. Your personal target blood pressure may vary. Follow these instructions at home: Eating and drinking  If told, follow the DASH eating plan. To follow this plan: Fill one half of your plate at each meal with fruits and vegetables. Fill one fourth of your plate  at each meal with whole grains. Whole grains include whole-wheat pasta, brown rice, and whole-grain bread. Eat or drink low-fat dairy products, such as skim milk or low-fat yogurt. Fill one fourth of your plate at each meal with low-fat (lean) proteins. Low-fat proteins include fish, chicken without skin, eggs, beans, and tofu. Avoid fatty meat, cured and processed meat, or chicken with skin. Avoid pre-made or processed food. Limit the amount of salt in your diet to less than 1,500 mg each day. Do not drink alcohol if: Your doctor tells you not to drink. You are pregnant, may be pregnant, or are planning to become pregnant. If you drink alcohol: Limit how much you have to: 0-1 drink a day for women. 0-2 drinks a day for men. Know how much alcohol is in your drink. In the [place] one drink equals one 12 oz bottle of beer (355 mL), one 5 oz glass of wine (148 mL), or one 1 oz glass of hard liquor (44 mL). Lifestyle  Work with your doctor to stay at a healthy weight or to lose weight. Ask your doctor what the best weight is for you. Get at least 30 minutes of exercise that causes your heart to beat faster (aerobic exercise) most days of the week. This may include walking, swimming, or biking. Get at least 30 minutes of exercise that strengthens your muscles (resistance exercise) at least 3 days a week. This may include lifting weights or doing Pilates. Do not smoke or use any products that contain nicotine or tobacco. If you need help quitting, ask your doctor. Check your blood pressure at home as told by your doctor. Keep all follow-up visits. Medicines Take over-the-counter and prescription medicines  only as told by your doctor. Follow directions carefully. Do not skip doses of blood pressure medicine. The medicine does not work as well if you skip doses. Skipping doses also puts you at risk for problems. Ask your doctor about side effects or reactions to medicines that you should watch  for. Contact a doctor if: You think you are having a reaction to the medicine you are taking. You have headaches that keep coming back. You feel dizzy. You have swelling in your ankles. You have trouble with your vision. Get help right away if: You get a very bad headache. You start to feel mixed up (confused). You feel weak or numb. You feel faint. You have very bad pain in your: Chest. Belly (abdomen). You vomit more than once. You have trouble breathing. These symptoms may be an emergency. Get help right away. Call 911. Do not wait to see if the symptoms will go away. Do not drive yourself to the hospital. Summary Hypertension is another name for high blood pressure. High blood pressure forces your heart to work harder to pump blood. For most people, a normal blood pressure is less than 120/80. Making healthy choices can help lower blood pressure. If your blood pressure does not get lower with healthy choices, you may need to take medicine. This information is not intended to replace advice given to you by your health care provider. Make sure you discuss any questions you have with your health care provider. Document Revised: 12/08/2020 Document Reviewed: 12/08/2020 Elsevier Patient Education  2024 [employer].

## 2024-01-26 ENCOUNTER — Ambulatory Visit: Payer: Self-pay | Admitting: Internal Medicine

## 2024-01-30 DIAGNOSIS — M1712 Unilateral primary osteoarthritis, left knee: Secondary | ICD-10-CM | POA: Insufficient documentation

## 2024-01-30 MED ORDER — VALSARTAN 160 MG PO TABS: 160.0000 mg | ORAL_TABLET | Freq: Every day | ORAL | 11 refills | Status: AC

## 2024-01-30 NOTE — Assessment & Plan Note (Addendum)
 Blood pressure elevated at 148/90 mmHg, with home systolic readings up to 170 mmHg, likely exacerbated by knee pain. Hypertension managed with valsartan , spironolactone , and furosemide .  - Continue current antihypertensive regimen. - Monitor blood pressure. - Follow low sodium diet

## 2024-01-30 NOTE — Assessment & Plan Note (Signed)
 Persistent pain post-fall, unrelieved by cortisone and gel injections. Orthopedic consultation suggested weight loss before knee replacement. Pain specialist recommended nerve injection pending insurance approval. Workman's compensation claim denied, legal consultation ongoing. - Continue pain management as per pain specialist's recommendation. - Follow up with attorney regarding workman's compensation claim.

## 2024-01-30 NOTE — Assessment & Plan Note (Signed)
Chronic, LDL goal is less than 70.  She will continue with rosuvastatin daily.

## 2024-01-30 NOTE — Assessment & Plan Note (Signed)
 Depression likely worsened by work-related stress. Under psychiatric care with venlafaxine , lorazepam , and Lexapro. Engaged in counseling and monthly psychiatric consultations. Oncologist approved continuation of psychiatric medications. - Maintain current psychiatric medications. - Continue weekly counseling sessions. - Monthly follow-up with psychiatrist.

## 2024-02-04 ENCOUNTER — Ambulatory Visit: Payer: Self-pay

## 2024-02-04 NOTE — Telephone Encounter (Signed)
 FYI Only or Action Required?: FYI only for provider: appointment scheduled on 12.3.25.  Patient was last seen in primary care on 01/22/2024 by [name], MD.  Called Nurse Triage reporting Sore Throat and Otalgia.  Symptoms began yesterday.  Interventions attempted: Nothing.  Symptoms are: gradually worsening.  Triage Disposition: See Physician Within 24 Hours  Patient/caregiver understands and will follow disposition?: Yes    Copied from CRM #[id number]. Topic: Clinical - Red Word Triage >> Feb 04, 2024 11:54 AM Hadassah PARAS wrote: Red Word that prompted transfer to Nurse Triage: Pain in neck and throat area, only in right side. When pt coughs or swallows it hurts, neck is sore to touch, around the area where port in chest is due to chemo treatment Reason for Disposition  Earache also present  Answer Assessment - Initial Assessment Questions Pt states she thought initially that she slept wrong. But today she realized it is her throat. Hurts with cough or to swallow. Also states that it hurts into her ear and to open her mouth fully. It is on the same side side as her port and she states there is no discomfort around her port or even the tube that leads up into her neck, she palpated it to be sure. She states pain is 4-5 out of ten. She states that she also has a meniscus tear and didn't sleep well 2 nights ago because she couldn't get comfortable so she did sleep a lot yesterday. She states that she found out her grandson's mother is also sick and she was around the grandson for the holidays.  Pt denies any difficulty swallowing able to swallow secretions, food and drink. Denies any chest pain, shortness of breath or fever.       1. ONSET: When did the throat start hurting? (Hours or days ago)      yesterday 2. SEVERITY: How bad is the sore throat? (Scale 1-10; mild, moderate or severe)     4-5 3. STREP EXPOSURE: Has there been any exposure to strep within the past week? If  Yes, ask: What type of contact occurred?      unknown 4.  VIRAL SYMPTOMS: Are there any symptoms of a cold, such as a runny nose, cough, hoarse voice or red eyes?      Slight cough at times 5. FEVER: Do you have a fever? If Yes, ask: What is your temperature, how was it measured, and when did it start?     denies 6. PUS ON THE TONSILS: Is there pus on the tonsils in the back of your throat?     Did not see any 7. OTHER SYMPTOMS: Do you have any other symptoms? (e.g., difficulty breathing, headache, rash)     Right ear pain  Protocols used: Sore Throat-A-AH

## 2024-02-05 ENCOUNTER — Ambulatory Visit: Admitting: Family Medicine

## 2024-02-06 ENCOUNTER — Ambulatory Visit: Admitting: Family Medicine

## 2024-02-06 ENCOUNTER — Encounter: Payer: Self-pay | Admitting: Family Medicine

## 2024-02-06 VITALS — BP 130/80 | HR 83 | Temp 98.6°F | Ht 68.0 in | Wt 323.0 lb

## 2024-02-06 DIAGNOSIS — J029 Acute pharyngitis, unspecified: Secondary | ICD-10-CM

## 2024-02-06 LAB — POCT RAPID STREP A (OFFICE): Rapid Strep A Screen: NEGATIVE

## 2024-02-06 MED ORDER — CHLORASEPTIC 1.4 % MT LIQD: 1.0000 | OROMUCOSAL | 0 refills | Status: AC | PRN

## 2024-02-06 NOTE — Progress Notes (Signed)
 I,Jameka J Llittleton, CMA,acting as a neurosurgeon for [employer], NP.,have documented all relevant documentation on the behalf of Bruna Creighton, NP,as directed by  Bruna Creighton, NP while in the presence of Bruna Creighton, NP.  Subjective:  Patient ID: Pamela [name] , female    DOB: 1965-07-16 , 58 y.o.   MRN: [id number]  Chief Complaint  Patient presents with   Sore Throat    Patient presents today for sore throat. She reports her throat starting on Sunday. She reports she touched her throat and felt pain and also felt the pain in her neck and near her ear. Patient reports it hurts to swallow.    HPI Discussed the use of AI scribe software for clinical note transcription with the patient, who gave verbal consent to proceed.  History of Present Illness    Patient is a 58 year old female who presents for sore throat that started on Sunday. She states it is hard to swallow,she denies any fever or chills.  No congestion or running nose. Strep test is negative.    Discussed the use of AI scribe software for clinical note transcription with the patient, who gave verbal consent to proceed.  History of Present Illness    Past Medical History:  Diagnosis Date   Anxiety    Asthma    Back pain    Breast cancer (HCC)    Constipation    DUB (dysfunctional uterine bleeding) 2007   Edema of both lower extremities    Elevated cholesterol    Family history of breast cancer 05/12/2020   Family history of lung cancer 05/12/2020   Food allergy    Grade I diastolic dysfunction 04/30/2023   H/O menorrhagia 05/01/2006   High cholesterol    History of ovarian cyst 2007   Hypertension 03/31/2005   Increased BMI 07/11/2006   Joint pain    Migraine    N&V (nausea and vomiting)    Obesity 2007   Obstructive sleep apnea syndrome, moderate 10/27/2013   no cpap   Personal history of chemotherapy    Personal history of radiation therapy    Sleep apnea    SOB (shortness of breath)    Vitamin D   deficiency    Vitamin D  deficiency    Weight loss 07/11/2006     Family History  Problem Relation Age of Onset   Hypertension Mother    High Cholesterol Mother    Thyroid  disease Mother    Cancer Mother    Sleep apnea Mother    Hypertension Father    Heart attack Father    Sudden death Father    Diabetes Maternal Grandmother    Bone cancer Maternal Grandfather        dx after 36   Breast cancer Maternal Aunt        dx 30s   Lung cancer Maternal Aunt        dx after 50     Current Outpatient Medications:    acetaminophen  (TYLENOL ) 500 MG tablet, Take 1 tablet (500 mg total) by mouth every 6 (six) hours as needed., Disp: 30 tablet, Rfl: 0   albuterol  (VENTOLIN  HFA) 108 (90 Base) MCG/ACT inhaler, Inhale 2 puffs into the lungs every 6 (six) hours as needed for wheezing or shortness of breath., Disp: 8 g, Rfl: 2   aspirin  EC 81 MG tablet, Take 1 tablet (81 mg total) by mouth daily. Swallow whole., Disp: 30 tablet, Rfl: 11   escitalopram (LEXAPRO) 10 MG tablet,  Take 10 mg by mouth daily. Take one tablet by mouth once daily., Disp: , Rfl:    furosemide  (LASIX ) 20 MG tablet, Take 1 tablet (20 mg total) by mouth as needed. TAKE 1 TABLET AS NEEDED FOR SWELLING OR SHORTNESS OF BREATH OR WEIGHT GAIN. 3 POUNDS WITHIN 24HR OR 5 POUNDS 7 DAYS, Disp: 90 tablet, Rfl: 0   HYDROcodone -acetaminophen  (NORCO/VICODIN) 5-325 MG tablet, Take 1 tablet by mouth., Disp: , Rfl:    levonorgestrel  (MIRENA , 52 MG,) 20 MCG/DAY IUD, 1 each by Intrauterine route once., Disp: , Rfl:    lidocaine -prilocaine  (EMLA ) cream, Apply to affected area once AS DIRECTED, Disp: 30 g, Rfl: 3   linaclotide  (LINZESS ) 145 MCG CAPS capsule, One cap po qd, Disp: , Rfl:    LORazepam  (ATIVAN ) 0.5 MG tablet, Take 0.5 mg by mouth daily. Take one tablet by mouth once daily as needed., Disp: , Rfl:    meloxicam  (MOBIC ) 7.5 MG tablet, Take 1 tablet (7.5 mg total) by mouth daily., Disp: 30 tablet, Rfl: 3   phenol (CHLORASEPTIC) 1.4 % LIQD,  Use as directed 1 spray in the mouth or throat as needed for throat irritation / pain., Disp: 177 mL, Rfl: 0   potassium chloride  (KLOR-CON  M) 10 MEQ tablet, Take 2 tablets (20 mEq total) by mouth 2 (two) times daily., Disp: 60 tablet, Rfl: 6   rosuvastatin  (CRESTOR ) 40 MG tablet, Take 1 tablet (40 mg total) by mouth daily., Disp: 90 tablet, Rfl: 3   semaglutide -weight management (WEGOVY ) 0.5 MG/0.5ML SOAJ SQ injection, Inject 0.5 mg into the skin once a week., Disp: 2 mL, Rfl: 0   Spacer/Aero-Holding Chambers (AEROCHAMBER PLUS) Device, Please dispense spacer for inhaler use, Disp: 1 each, Rfl: 0   spironolactone  (ALDACTONE ) 25 MG tablet, Take 1 tablet (25 mg total) by mouth daily., Disp: 30 tablet, Rfl: 6   valsartan  (DIOVAN ) 160 MG tablet, Take 1 tablet (160 mg total) by mouth daily., Disp: 30 tablet, Rfl: 11   venlafaxine  XR (EFFEXOR -XR) 37.5 MG 24 hr capsule, Take 37.5 mg by mouth every morning., Disp: , Rfl:  No current facility-administered medications for this visit.  Facility-Administered Medications Ordered in Other Visits:    heparin  lock flush 100 unit/mL, 500 Units, Intracatheter, Once, Odean Potts, MD   sodium chloride  flush (NS) 0.9 % injection 10 mL, 10 mL, Intracatheter, Once, Gudena, Vinay, MD   Allergies  Allergen Reactions   Pollen Extract Shortness Of Breath   Compazine  [Prochlorperazine ] Anxiety    IV compazine  causes severe anxiety attack   Latex Rash   Codeine Hives   Diflucan  [Fluconazole ] Hives   Shellfish Allergy Hives   Betadine [Povidone Iodine] Rash     Review of Systems  HENT:  Positive for ear pain and sore throat.   Cardiovascular: Negative.   Gastrointestinal: Negative.   Skin: Negative.   Neurological: Negative.   Psychiatric/Behavioral: Negative.       Today's Vitals   02/06/24 1622  BP: 130/80  Pulse: 83  Temp: 98.6 F (37 C)  TempSrc: Oral  Weight: (!) 323 lb (146.5 kg)  Height: 5' 8 (1.727 m)  PainSc: 7   PainLoc: Throat   Body  mass index is 49.11 kg/m.  Wt Readings from Last 3 Encounters:  02/21/24 (!) 327 lb 9.6 oz (148.6 kg)  02/19/24 (!) 328 lb 1.6 oz (148.8 kg)  02/06/24 (!) 323 lb (146.5 kg)    The 10-year ASCVD risk score (Arnett DK, et al., 2019) is: 15.6%   Values  used to calculate the score:     Age: 60 years     Clinically relevant sex: Female     Is Non-Hispanic African American: Yes     Diabetic: No     Tobacco smoker: No     Systolic Blood Pressure: 182 mmHg     Is BP treated: Yes     HDL Cholesterol: 71 mg/dL     Total Cholesterol: 229 mg/dL  Objective:  Physical Exam Constitutional:      Appearance: Normal appearance.  HENT:     Head: Normocephalic.  Cardiovascular:     Rate and Rhythm: Normal rate and regular rhythm.     Pulses: Normal pulses.     Heart sounds: Normal heart sounds.  Pulmonary:     Effort: Pulmonary effort is normal.     Breath sounds: Normal breath sounds.  Abdominal:     General: Bowel sounds are normal.  Skin:    General: Skin is warm and dry.  Neurological:     Mental Status: She is alert and oriented to person, place, and time.         Assessment And Plan:   Assessment & Plan Sore throat Negative Strep test Morbid obesity (HCC) She is encouraged to strive for BMI less than 30 to decrease cardiac risk. Advised to aim for at least 150 minutes of exercise per week.   Orders Placed This Encounter  Procedures   POCT rapid strep A     Return if symptoms worsen or fail to improve, for keep next appt.  Patient was given opportunity to ask questions. Patient verbalized understanding of the plan and was able to repeat key elements of the plan. All questions were answered to their satisfaction.   I, Bruna Creighton, NP, have reviewed all documentation for this visit. The documentation on 02/26/2024 for the exam, diagnosis, procedures, and orders are all accurate and complete.    IF YOU HAVE BEEN REFERRED TO A SPECIALIST, IT MAY TAKE 1-2 WEEKS TO  SCHEDULE/PROCESS THE REFERRAL. IF YOU HAVE NOT HEARD FROM US /SPECIALIST IN TWO WEEKS, PLEASE GIVE US  A CALL AT [phone] X [phone].

## 2024-02-18 ENCOUNTER — Telehealth: Payer: Self-pay | Admitting: *Deleted

## 2024-02-18 NOTE — Telephone Encounter (Signed)
 Call received from Pamela [name], [phone] (home) requesting form staff to amend her ADA form and receive a copy of the original.  ADA reads start date of 12/18/2023 through 04/30/2024.  Employer says the dates need to match [employer] form end date of 03/09/2024.  The dates can be changed on the original ADA form.  I will be in tomorrow,  Discuss this with provider and let you all know what date to use.   Awaiting telephone call or visit to see form staff after tomorrow's follow up.

## 2024-02-19 ENCOUNTER — Encounter: Payer: Self-pay | Admitting: *Deleted

## 2024-02-19 ENCOUNTER — Inpatient Hospital Stay

## 2024-02-19 ENCOUNTER — Inpatient Hospital Stay: Attending: Hematology and Oncology | Admitting: Adult Health

## 2024-02-19 ENCOUNTER — Encounter: Payer: Self-pay | Admitting: Adult Health

## 2024-02-19 ENCOUNTER — Telehealth: Payer: Self-pay

## 2024-02-19 VITALS — BP 176/88 | HR 80 | Temp 97.9°F | Resp 18 | Ht 68.0 in | Wt 328.1 lb

## 2024-02-19 DIAGNOSIS — Z83438 Family history of other disorder of lipoprotein metabolism and other lipidemia: Secondary | ICD-10-CM | POA: Diagnosis not present

## 2024-02-19 DIAGNOSIS — M1712 Unilateral primary osteoarthritis, left knee: Secondary | ICD-10-CM | POA: Insufficient documentation

## 2024-02-19 DIAGNOSIS — Z95828 Presence of other vascular implants and grafts: Secondary | ICD-10-CM

## 2024-02-19 DIAGNOSIS — Z8349 Family history of other endocrine, nutritional and metabolic diseases: Secondary | ICD-10-CM | POA: Diagnosis not present

## 2024-02-19 DIAGNOSIS — I427 Cardiomyopathy due to drug and external agent: Secondary | ICD-10-CM | POA: Diagnosis not present

## 2024-02-19 DIAGNOSIS — Z801 Family history of malignant neoplasm of trachea, bronchus and lung: Secondary | ICD-10-CM | POA: Diagnosis not present

## 2024-02-19 DIAGNOSIS — W19XXXA Unspecified fall, initial encounter: Secondary | ICD-10-CM | POA: Diagnosis not present

## 2024-02-19 DIAGNOSIS — Z1722 Progesterone receptor negative status: Secondary | ICD-10-CM | POA: Diagnosis not present

## 2024-02-19 DIAGNOSIS — Z8249 Family history of ischemic heart disease and other diseases of the circulatory system: Secondary | ICD-10-CM | POA: Diagnosis not present

## 2024-02-19 DIAGNOSIS — Z833 Family history of diabetes mellitus: Secondary | ICD-10-CM | POA: Diagnosis not present

## 2024-02-19 DIAGNOSIS — Z1731 Human epidermal growth factor receptor 2 positive status: Secondary | ICD-10-CM | POA: Diagnosis not present

## 2024-02-19 DIAGNOSIS — Z803 Family history of malignant neoplasm of breast: Secondary | ICD-10-CM | POA: Diagnosis not present

## 2024-02-19 DIAGNOSIS — Z9221 Personal history of antineoplastic chemotherapy: Secondary | ICD-10-CM | POA: Diagnosis not present

## 2024-02-19 DIAGNOSIS — F32A Depression, unspecified: Secondary | ICD-10-CM | POA: Insufficient documentation

## 2024-02-19 DIAGNOSIS — I700 Atherosclerosis of aorta: Secondary | ICD-10-CM | POA: Insufficient documentation

## 2024-02-19 DIAGNOSIS — C50511 Malignant neoplasm of lower-outer quadrant of right female breast: Secondary | ICD-10-CM | POA: Insufficient documentation

## 2024-02-19 DIAGNOSIS — Z923 Personal history of irradiation: Secondary | ICD-10-CM | POA: Insufficient documentation

## 2024-02-19 DIAGNOSIS — Z170 Estrogen receptor positive status [ER+]: Secondary | ICD-10-CM | POA: Insufficient documentation

## 2024-02-19 DIAGNOSIS — R110 Nausea: Secondary | ICD-10-CM

## 2024-02-19 DIAGNOSIS — Z91040 Latex allergy status: Secondary | ICD-10-CM | POA: Insufficient documentation

## 2024-02-19 DIAGNOSIS — Z79899 Other long term (current) drug therapy: Secondary | ICD-10-CM | POA: Insufficient documentation

## 2024-02-19 DIAGNOSIS — Y990 Civilian activity done for income or pay: Secondary | ICD-10-CM | POA: Insufficient documentation

## 2024-02-19 DIAGNOSIS — M1611 Unilateral primary osteoarthritis, right hip: Secondary | ICD-10-CM | POA: Diagnosis not present

## 2024-02-19 DIAGNOSIS — C50919 Malignant neoplasm of unspecified site of unspecified female breast: Secondary | ICD-10-CM

## 2024-02-19 DIAGNOSIS — Z5112 Encounter for antineoplastic immunotherapy: Secondary | ICD-10-CM | POA: Insufficient documentation

## 2024-02-19 LAB — CMP (CANCER CENTER ONLY)
ALT: 25 U/L (ref 0–44)
AST: 56 U/L — ABNORMAL HIGH (ref 15–41)
Albumin: 3.5 g/dL (ref 3.5–5.0)
Alkaline Phosphatase: 116 U/L (ref 38–126)
Anion gap: 8 (ref 5–15)
BUN: 7 mg/dL (ref 6–20)
CO2: 26 mmol/L (ref 22–32)
Calcium: 8.7 mg/dL — ABNORMAL LOW (ref 8.9–10.3)
Chloride: 106 mmol/L (ref 98–111)
Creatinine: 0.73 mg/dL (ref 0.44–1.00)
GFR, Estimated: >60 mL/min (ref >60)
Glucose, Bld: 105 mg/dL — ABNORMAL HIGH (ref 70–99)
Potassium: 3.3 mmol/L — ABNORMAL LOW (ref 3.5–5.1)
Sodium: 141 mmol/L (ref 135–145)
Total Bilirubin: 1.2 mg/dL (ref 0.0–1.2)
Total Protein: 6.5 g/dL (ref 6.5–8.1)

## 2024-02-19 LAB — CBC WITH DIFFERENTIAL (CANCER CENTER ONLY)
Abs Immature Granulocytes: 0.01 K/uL (ref 0.00–0.07)
Basophils Absolute: 0 K/uL (ref 0.0–0.1)
Basophils Relative: 1 %
Eosinophils Absolute: 0.2 K/uL (ref 0.0–0.5)
Eosinophils Relative: 4 %
HCT: 36 % (ref 36.0–46.0)
Hemoglobin: 11.7 g/dL — ABNORMAL LOW (ref 12.0–15.0)
Immature Granulocytes: 0 %
Lymphocytes Relative: 24 %
Lymphs Abs: 0.9 K/uL (ref 0.7–4.0)
MCH: 27.1 pg (ref 26.0–34.0)
MCHC: 32.5 g/dL (ref 30.0–36.0)
MCV: 83.3 fL (ref 80.0–100.0)
Monocytes Absolute: 0.6 K/uL (ref 0.1–1.0)
Monocytes Relative: 16 %
Neutro Abs: 2.2 K/uL (ref 1.7–7.7)
Neutrophils Relative %: 55 %
Platelet Count: 131 K/uL — ABNORMAL LOW (ref 150–400)
RBC: 4.32 MIL/uL (ref 3.87–5.11)
RDW: 15.7 % — ABNORMAL HIGH (ref 11.5–15.5)
WBC Count: 3.9 K/uL — ABNORMAL LOW (ref 4.0–10.5)
nRBC: 0 % (ref 0.0–0.2)

## 2024-02-19 MED ORDER — PALONOSETRON HCL INJECTION 0.25 MG/5ML
0.2500 mg | Freq: Once | INTRAVENOUS | Status: AC
  Administered 2024-02-19: 11:00:00 0.25 mg via INTRAVENOUS
  Filled 2024-02-19: qty 5

## 2024-02-19 MED ORDER — FAM-TRASTUZUMAB DERUXTECAN-NXKI CHEMO 100 MG IV SOLR
300.0000 mg | Freq: Once | INTRAVENOUS | Status: AC
  Administered 2024-02-19: 12:00:00 300 mg via INTRAVENOUS
  Filled 2024-02-19: qty 15

## 2024-02-19 MED ORDER — SODIUM CHLORIDE 0.9 % IV SOLN
150.0000 mg | Freq: Once | INTRAVENOUS | Status: AC
  Administered 2024-02-19: 11:00:00 150 mg via INTRAVENOUS
  Filled 2024-02-19: qty 150

## 2024-02-19 MED ORDER — SODIUM CHLORIDE 0.9 % IV SOLN: Freq: Once | INTRAVENOUS | Status: AC

## 2024-02-19 MED ORDER — CYANOCOBALAMIN 1000 MCG/ML IJ SOLN
1000.0000 ug | Freq: Once | INTRAMUSCULAR | Status: AC
  Administered 2024-02-19: 13:00:00 1000 ug via INTRAMUSCULAR
  Filled 2024-02-19: qty 1

## 2024-02-19 MED ORDER — ACETAMINOPHEN 325 MG PO TABS
650.0000 mg | ORAL_TABLET | Freq: Once | ORAL | Status: AC
  Administered 2024-02-19: 11:00:00 650 mg via ORAL
  Filled 2024-02-19: qty 2

## 2024-02-19 MED ORDER — DEXTROSE 5 % IV SOLN: Freq: Once | INTRAVENOUS | Status: AC

## 2024-02-19 NOTE — Patient Instructions (Signed)
 [facility] - A DEPT OF [facility]  Discharge Instructions: Thank you for choosing [facility] to provide your oncology and hematology care.   If you have a lab appointment with the Cancer Center, please go directly to the Cancer Center and check in at the registration area.   Wear comfortable clothing and clothing appropriate for easy access to any Portacath or PICC line.   We strive to give you quality time with your provider. You may need to reschedule your appointment if you arrive late (15 or more minutes).  Arriving late affects you and other patients whose appointments are after yours.  Also, if you miss three or more appointments without notifying the office, you may be dismissed from the clinic at the provider's discretion.      For prescription refill requests, have your pharmacy contact our office and allow 72 hours for refills to be completed.    Today you received the following chemotherapy and/or immunotherapy agents Enhertu      To help prevent nausea and vomiting after your treatment, we encourage you to take your nausea medication as directed.  BELOW ARE SYMPTOMS THAT SHOULD BE REPORTED IMMEDIATELY: *FEVER GREATER THAN 100.4 F (38 C) OR HIGHER *CHILLS OR SWEATING *NAUSEA AND VOMITING THAT IS NOT CONTROLLED WITH YOUR NAUSEA MEDICATION *UNUSUAL SHORTNESS OF BREATH *UNUSUAL BRUISING OR BLEEDING *URINARY PROBLEMS (pain or burning when urinating, or frequent urination) *BOWEL PROBLEMS (unusual diarrhea, constipation, pain near the anus) TENDERNESS IN MOUTH AND THROAT WITH OR WITHOUT PRESENCE OF ULCERS (sore throat, sores in mouth, or a toothache) UNUSUAL RASH, SWELLING OR PAIN  UNUSUAL VAGINAL DISCHARGE OR ITCHING   Items with * indicate a potential emergency and should be followed up as soon as possible or go to the Emergency Department if any problems should occur.  Please show the CHEMOTHERAPY ALERT CARD or IMMUNOTHERAPY  ALERT CARD at check-in to the Emergency Department and triage nurse.  Should you have questions after your visit or need to cancel or reschedule your appointment, please contact [facility] - A DEPT OF [facility]  Dept: [phone]  and follow the prompts.  Office hours are 8:00 a.m. to 4:30 p.m. Monday - Friday. Please note that voicemails left after 4:00 p.m. may not be returned until the following business day.  We are closed weekends and major holidays. You have access to a nurse at all times for urgent questions. Please call the main number to the clinic Dept: [phone] and follow the prompts.   For any non-urgent questions, you may also contact your provider using MyChart. We now offer e-Visits for anyone 19 and older to request care online for non-urgent symptoms. For details visit [email or web address].   Also download the MyChart app! Go to the app store, search "MyChart", open the app, select [facility], and log in with your MyChart username and password.

## 2024-02-19 NOTE — Progress Notes (Unsigned)
 [facility] Cancer Follow up:    Pamela Medici, MD [address] [place] [place] [address]   DIAGNOSIS: Cancer Staging  Malignant neoplasm of lower-outer quadrant of right breast of female, estrogen receptor positive (HCC) Staging form: Breast, AJCC 8th Edition - Clinical stage from 05/11/2020: Stage IA (cT1b, cN0, cM0, G3, ER+, PR-, HER2+) - Signed by Odean Potts, MD on 05/11/2020 Stage prefix: Initial diagnosis - Pathologic stage from 06/02/2020: Stage IA (pT1c, pN0, cM0, G3, ER+, PR-, HER2+) - Signed by Crawford Pamela Pickle, NP on 06/15/2020 Stage prefix: Initial diagnosis Histologic grading system: 3 grade system    SUMMARY OF ONCOLOGIC HISTORY: Oncology History  Malignant neoplasm of lower-outer quadrant of right breast of female, estrogen receptor positive (HCC)  05/03/2020 Initial Diagnosis   Screening mammogram showed a 1.0cm mass at the 6 o'clock position in the right breast. Diagnostic mammogram and US  showed the 1.2cm mass at the 6 o'clock position in the right breast. Biopsy showed IDC, grade 3, HER-2 positive (3+), ER 15% weak, PR-, Ki67 80%.   05/19/2020 Genetic Testing   Negative hereditary cancer genetic testing: no pathogenic variants detected in [facility] Breast STAT Panel and Common Hereditary Cancers Panel.  The report dates are May 19, 2020 (STAT) and May 23, 2020 (Common Hereditary).   The Common Hereditary Cancers Panel offered by [facility] includes sequencing and/or deletion duplication testing of the following 47 genes: APC, ATM, AXIN2, BARD1, BMPR1A, BRCA1, BRCA2, BRIP1, CDH1, CDK4, CDKN2A (p14ARF), CDKN2A (p16INK4a), CHEK2, CTNNA1, DICER1, EPCAM (Deletion/duplication testing only), GREM1 (promoter region deletion/duplication testing only), GREM1, HOXB13, KIT, MEN1, MLH1, MSH2, MSH3, MSH6, MUTYH, NBN, NF1, NHTL1, PALB2, PDGFRA, PMS2, POLD1, POLE, PTEN, RAD50, RAD51C, RAD51D, SDHA, SDHB, SDHC, SDHD, SMAD4, SMARCA4. STK11, TP53, TSC1, TSC2,  and VHL.  The following genes were evaluated for sequence changes only: SDHA and HOXB13 c.251G>A variant only.es only: SDHA and HOXB13 c.251G>A variant only.   06/02/2020 Surgery   Right lumpectomy ([facility]): invasive and in situ ductal carcinoma, 1.3cm, clear margins, 1 right axillary lymph node negative for carcinoma.   06/02/2020 Cancer Staging   Staging form: Breast, AJCC 8th Edition - Pathologic stage from 06/02/2020: Stage IA (pT1c, pN0, cM0, G3, ER+, PR-, HER2+) - Signed by Crawford Pamela Pickle, NP on 06/15/2020 Stage prefix: Initial diagnosis Histologic grading system: 3 grade system   07/08/2020 - 10/21/2020 Chemotherapy   Taxol  Herceptin  followed by Herceptin  maintenance   04/14/2021 -  Anti-estrogen oral therapy   Tamoxifen  10 mg   07/13/2021 Imaging   CT angiogram: Right supraclavicular lymph node 5 cm, right internal mammary lymph nodes 1.8 cm    08/06/2021 PET scan   PET CT scan: Ext Right Supra clav LN along with Internal mammary and Rt Upper Mediastinal and Rt Axillary LN   08/2021 Initial Biopsy   Right supraclavicular lymphadenopathy: Biopsy: Metastatic poorly differentiated adenocarcinoma, ER +40%, PR negative, HER2 positive 3+    08/21/2021 -  Chemotherapy   Enhertu  every 3 weeks    03/08/2022 Imaging   CT chest/abdomen/pelvis  1. No new or progressive findings in the chest, abdomen or pelvis. 2. Signs of RIGHT breast lumpectomy with areas of central fat necrosis unchanged from previous imaging. 3. Stable small lymph nodes throughout the chest, largest with area of soft tissue about surgical clips in the RIGHT axilla. 4. Stable appearance of thoracic inlet lymph nodes, not shown to be hypermetabolic on prior PET imaging. 5. IUD in-situ. 6. Aortic atherosclerosis.   08/21/2022 Imaging   Echocardiogram  -  normal LVEF at 60-65%. -no left ventricular wall motion abnormalities  -normal left ventricular diastolic function  -mild left ventricular hypertrophy.     10/05/2022 Imaging   CT Chest, Abdomen, and Pelvis with contrast IMPRESSION: 1. Stable examination without new or progressive finding in the chest, abdomen or pelvis to suggest recurrent or metastatic disease. 2. Stable tiny bilateral thoracic inlet lymph nodes. 3.  Aortic Atherosclerosis (ICD10-I70.0).     CURRENT THERAPY:  INTERVAL HISTORY:  Discussed the use of AI scribe software for clinical note transcription with the patient, who gave verbal consent to proceed.  History of Present Illness      Patient Active Problem List   Diagnosis Date Noted   Osteoarthritis of left knee 01/30/2024   Fall from slip, trip, or stumble, subsequent encounter 12/04/2023   Acute medial meniscus tear of left knee 12/04/2023   Adjustment disorder with mixed anxiety and depressed mood 07/27/2023   Chemotherapy induced nausea and vomiting 07/22/2023   Encounter for annual health examination 07/22/2023   Hypertensive heart disease without heart failure 05/09/2023   Transaminitis 05/09/2023   Hypophosphatemia 04/30/2023   Thrombocytopenia 04/30/2023   Elevated troponin 04/30/2023   Normocytic anemia 04/30/2023   Grade I diastolic dysfunction 04/30/2023   Prolonged QT interval 04/30/2023   Drug-induced constipation 11/24/2022   Chemotherapy induced cardiomyopathy 05/08/2022   Aortic atherosclerosis 04/26/2022   Hypomagnesemia 11/26/2021   Hypokalemia 11/26/2021   Dizziness, fatigue, globus sensation 11/26/2021   Breast cancer (HCC) 08/25/2021   Nausea without vomiting 08/25/2021   Prediabetes 03/13/2021   Non-intractable vomiting 07/13/2020   Port-A-Cath in place 07/08/2020   Genetic testing 05/20/2020   Family history of breast cancer 05/12/2020   Family history of lung cancer 05/12/2020   Malignant neoplasm of lower-outer quadrant of right breast of female, estrogen receptor positive (HCC) 05/03/2020   Elevated troponin level not due myocardial  infarction 09/18/2019   Mixed hyperlipidemia 09/18/2019   Hypertensive emergency 09/17/2019   Primary osteoarthritis of right hip 08/06/2018   Seasonal allergies 06/12/2018   Fibrocystic disease of breast 05/27/2018   Obstructive sleep apnea syndrome, moderate 10/27/2013   Shift work sleep disorder 10/27/2013   Psychic factors associated with diseases classified elsewhere 08/07/2013   Pure hypercholesterolemia 07/21/2013   Essential hypertension 07/21/2013   Snoring 07/21/2013   IUD contraception-inserted 10/01/11 10/01/2011   Class 3 severe obesity due to excess calories with serious comorbidity and body mass index (BMI) of 60.0 to 69.9 in adult (HCC) 09/03/2011    is allergic to pollen extract, compazine  [prochlorperazine ], latex, codeine, diflucan  [fluconazole ], shellfish allergy, and betadine [povidone iodine].  MEDICAL HISTORY: Past Medical History:  Diagnosis Date   Anxiety    Asthma    Back pain    Breast cancer (HCC)    Constipation    DUB (dysfunctional uterine bleeding) 2007   Edema of both lower extremities    Elevated cholesterol    Family history of breast cancer 05/12/2020   Family history of lung cancer 05/12/2020   Food allergy    Grade I diastolic dysfunction 04/30/2023   H/O menorrhagia 05/01/2006   High cholesterol    History of ovarian cyst 2007   Hypertension 03/31/2005   Increased BMI 07/11/2006   Joint pain    Migraine    N&V (nausea and vomiting)    Obesity 2007   Obstructive sleep apnea syndrome, moderate 10/27/2013   no cpap   Personal history of chemotherapy    Personal history of radiation therapy  Sleep apnea    SOB (shortness of breath)    Vitamin D  deficiency    Vitamin D  deficiency    Weight loss 07/11/2006    SURGICAL HISTORY: Past Surgical History:  Procedure Laterality Date   BREAST LUMPECTOMY     BREAST LUMPECTOMY WITH RADIOACTIVE SEED AND SENTINEL LYMPH NODE BIOPSY Right  06/02/2020   Procedure: RIGHT BREAST LUMPECTOMY WITH RADIOACTIVE SEED AND RIGHT SENTINEL LYMPH NODE BIOPSY;  Surgeon: Vanderbilt Ned, MD;  Location: [facility];  Service: General;  Laterality: Right;   BREAST REDUCTION SURGERY  05/04/1991   CESAREAN SECTION     HYSTEROSCOPY  05/01/2006   PORTACATH PLACEMENT Right 06/02/2020   Procedure: INSERTION PORT-A-CATH;  Surgeon: Vanderbilt Ned, MD;  Location: [facility];  Service: General;  Laterality: Right;   REDUCTION MAMMAPLASTY      SOCIAL HISTORY: Social History   Socioeconomic History   Marital status: Single    Spouse name: Not on file   Number of children: Not on file   Years of education: Not on file   Highest education level: Not on file  Occupational History   Occupation: Child Psychotherapist  Tobacco Use   Smoking status: Never   Smokeless tobacco: Never  Vaping Use   Vaping status: Never Used  Substance and Sexual Activity   Alcohol use: No   Drug use: Not Currently    Frequency: 2.0 times per week   Sexual activity: Not Currently    Birth control/protection: I.U.D.    Comment: Mirena   Other Topics Concern   Not on file  Social History Narrative   Not on file   Social Drivers of Health   Tobacco Use: Low Risk (02/06/2024)   Patient History    Smoking Tobacco Use: Never    Smokeless Tobacco Use: Never    Passive Exposure: Not on file  Financial Resource Strain: Low Risk (11/28/2023)   Overall Financial Resource Strain (CARDIA)    Difficulty of Paying Living Expenses: Not hard at all  Food Insecurity: No Food Insecurity (11/28/2023)   Epic    Worried About Radiation Protection Practitioner of Food in the Last Year: Never true    Ran Out of Food in the Last Year: Never true  Transportation Needs: No Transportation Needs (11/28/2023)   Epic    Lack of Transportation (Medical): No    Lack of Transportation (Non-Medical): No  Physical Activity: Not on file  Stress: Not on file  Social  Connections: Moderately Isolated (04/30/2023)   Social Connection and Isolation Panel    Frequency of Communication with Friends and Family: More than three times a week    Frequency of Social Gatherings with Friends and Family: More than three times a week    Attends Religious Services: More than 4 times per year    Active Member of [employer] or Organizations: No    Attends Banker Meetings: Not on file    Marital Status: Separated  Intimate Partner Violence: Not At Risk (11/28/2023)   Epic    Fear of Current or Ex-Partner: No    Emotionally Abused: No    Physically Abused: No    Sexually Abused: No  Depression (PHQ2-9): Medium Risk (01/22/2024)   Depression (PHQ2-9)    PHQ-2 Score: 9  Alcohol Screen: Not on file  Housing: Unknown (11/28/2023)   Epic    Unable to Pay for Housing in the Last Year: No    Number of Times Moved in the Last Year: Not on  file    Homeless in the Last Year: No  Utilities: Not At Risk (11/28/2023)   Epic    Threatened with loss of utilities: No  Health Literacy: Not on file    FAMILY HISTORY: Family History  Problem Relation Age of Onset   Hypertension Mother    High Cholesterol Mother    Thyroid  disease Mother    Cancer Mother    Sleep apnea Mother    Hypertension Father    Heart attack Father    Sudden death Father    Diabetes Maternal Grandmother    Bone cancer Maternal Grandfather        dx after 67   Breast cancer Maternal Aunt        dx 30s   Lung cancer Maternal Aunt        dx after 50    Review of Systems - Oncology    PHYSICAL EXAMINATION   Onc Performance Status - 02/19/24 0900       KPS SCALE   KPS % SCORE Able to carry on normal activity, minor s/s of disease          Vitals:   02/19/24 0936  BP: (!) 176/88  Pulse: 80  Resp: 18  Temp: 97.9 F (36.6 C)  SpO2: 100%    Physical Exam  LABORATORY DATA:  CBC    Component Value Date/Time   WBC 3.9 (L) 02/19/2024 0908   WBC  6.5 05/03/2023 0522   RBC 4.32 02/19/2024 0908   HGB 11.7 (L) 02/19/2024 0908   HGB 13.2 09/22/2019 1040   HCT 36.0 02/19/2024 0908   HCT 41.3 09/22/2019 1040   PLT 131 (L) 02/19/2024 0908   PLT 247 09/22/2019 1040   MCV 83.3 02/19/2024 0908   MCV 83 09/22/2019 1040   MCH 27.1 02/19/2024 0908   MCHC 32.5 02/19/2024 0908   RDW 15.7 (H) 02/19/2024 0908   RDW 14.7 09/22/2019 1040   LYMPHSABS 0.9 02/19/2024 0908   MONOABS 0.6 02/19/2024 0908   EOSABS 0.2 02/19/2024 0908   BASOSABS 0.0 02/19/2024 0908    CMP     Component Value Date/Time   NA 140 01/15/2024 0922   NA 142 09/22/2019 1040   K 3.3 (L) 01/15/2024 0922   CL 106 01/15/2024 0922   CO2 29 01/15/2024 0922   GLUCOSE 100 (H) 01/15/2024 0922   BUN 7 01/15/2024 0922   BUN 11 09/22/2019 1040   CREATININE 0.68 01/15/2024 0922   CALCIUM  9.0 01/15/2024 0922   PROT 6.6 01/15/2024 0922   PROT 6.7 06/17/2019 1054   ALBUMIN 3.4 (L) 01/15/2024 0922   ALBUMIN 4.5 06/17/2019 1054   AST 33 01/15/2024 0922   ALT 19 01/15/2024 0922   ALKPHOS 113 01/15/2024 0922   BILITOT 1.7 (H) 01/15/2024 0922   GFRNONAA >60 01/15/2024 0922   GFRAA 77 09/22/2019 1040     ASSESSMENT and THERAPY PLAN:   No problem-specific Assessment & Plan notes found for this encounter.   Assessment and Plan Assessment & Plan       All questions were answered. The patient knows to call the clinic with any problems, questions or concerns. We can certainly see the patient much sooner if necessary.  Total encounter time:*** minutes*in face-to-face visit time, chart review, lab review, care coordination, order entry, and documentation of the encounter time.    Pamela Kendall, NP 02/19/2024 10:07 AM Medical Oncology and Hematology [facility] [address] [place], [place] [address] Tel.  [phone]    Fax. [phone]  *Total Encounter Time as defined by the Centers for Medicare and Medicaid Services includes, in addition to the  face-to-face time of a patient visit (documented in the note above) non-face-to-face time: obtaining and reviewing outside history, ordering and reviewing medications, tests or procedures, care coordination (communications with other health care professionals or caregivers) and documentation in the medical record.

## 2024-02-19 NOTE — Telephone Encounter (Signed)
 Spoke with pt regarding her Accommodation Form. She stated that some changes needed to be made in regards to the form. Pt also requested a doctors note.

## 2024-02-21 ENCOUNTER — Encounter (HOSPITAL_COMMUNITY): Payer: Self-pay | Admitting: Internal Medicine

## 2024-02-21 ENCOUNTER — Ambulatory Visit (HOSPITAL_COMMUNITY): Admission: RE | Admit: 2024-02-21 | Discharge: 2024-02-21 | Attending: Internal Medicine | Admitting: Internal Medicine

## 2024-02-21 ENCOUNTER — Ambulatory Visit (HOSPITAL_COMMUNITY)
Admission: RE | Admit: 2024-02-21 | Discharge: 2024-02-21 | Disposition: A | Discharge status: Home / Self Care | Source: Ambulatory Visit | Attending: Internal Medicine | Admitting: Internal Medicine

## 2024-02-21 ENCOUNTER — Encounter: Payer: Self-pay | Admitting: Hematology and Oncology

## 2024-02-21 VITALS — BP 182/88 | HR 71 | Ht 68.0 in | Wt 327.6 lb

## 2024-02-21 DIAGNOSIS — Z6841 Body Mass Index (BMI) 40.0 and over, adult: Secondary | ICD-10-CM | POA: Diagnosis not present

## 2024-02-21 DIAGNOSIS — Z170 Estrogen receptor positive status [ER+]: Secondary | ICD-10-CM | POA: Diagnosis not present

## 2024-02-21 DIAGNOSIS — T451X5A Adverse effect of antineoplastic and immunosuppressive drugs, initial encounter: Secondary | ICD-10-CM

## 2024-02-21 DIAGNOSIS — I119 Hypertensive heart disease without heart failure: Secondary | ICD-10-CM | POA: Diagnosis not present

## 2024-02-21 DIAGNOSIS — E66813 Obesity, class 3: Secondary | ICD-10-CM | POA: Diagnosis not present

## 2024-02-21 DIAGNOSIS — I427 Cardiomyopathy due to drug and external agent: Secondary | ICD-10-CM

## 2024-02-21 DIAGNOSIS — C50511 Malignant neoplasm of lower-outer quadrant of right female breast: Secondary | ICD-10-CM | POA: Insufficient documentation

## 2024-02-21 DIAGNOSIS — I342 Nonrheumatic mitral (valve) stenosis: Secondary | ICD-10-CM | POA: Insufficient documentation

## 2024-02-21 DIAGNOSIS — I10 Essential (primary) hypertension: Secondary | ICD-10-CM

## 2024-02-21 DIAGNOSIS — I3481 Nonrheumatic mitral (valve) annulus calcification: Secondary | ICD-10-CM | POA: Diagnosis not present

## 2024-02-21 DIAGNOSIS — I358 Other nonrheumatic aortic valve disorders: Secondary | ICD-10-CM | POA: Insufficient documentation

## 2024-02-21 LAB — ECHOCARDIOGRAM COMPLETE
Area-P 1/2: 3.31 cm2
Calc EF: 55.9 %
Est EF: 50
MV VTI: 2.4 cm2
S' Lateral: 3.8 cm
Single Plane A2C EF: 62.3 %
Single Plane A4C EF: 53.2 %

## 2024-02-21 NOTE — Patient Instructions (Signed)
 Medication Changes:  None, continue current medications   Special Instructions // Education:  Do the following things EVERYDAY: Weigh yourself in the morning before breakfast. Write it down and keep it in a log. Take your medicines as prescribed Eat low salt foods--Limit salt (sodium) to 2000 mg per day.  Stay as active as you can everyday Limit all fluids for the day to less than 2 liters  Your physician has requested that you regularly monitor and record your blood pressure readings at home. Please use the same machine at the same time of day to check your readings and record them to bring to your follow-up visit. **IF consistently running high please let us  know  Follow-Up in: 4 months with an echocardiogram (April 2026), **PLEASE CALL OUR OFFICE IN FEBRUARY TO SCHEDULE THIS APPOINTMENT   At [facility], you and your health needs are our priority. We have a designated team specialized in the treatment of Heart Failure. This Care Team includes your primary Heart Failure Specialized Cardiologist (physician), Advanced Practice Providers (APPs- Physician Assistants and Nurse Practitioners), and Pharmacist who all work together to provide you with the care you need, when you need it.   You may see any of the following providers on your designated Care Team at your next follow up:  Dr. Toribio Fuel Dr. Ezra Shuck Dr. Odis Brownie Greig Mosses, NP Caffie Shed, [place] [facility] [place], [place] Beckey Coe, NP Jordan Lee, NP Tinnie Redman, PharmD   Please be sure to bring in all your medications bottles to every appointment.   Need to Contact Us :  If you have any questions or concerns before your next appointment please send us  a message through [place] or call our office at [phone].    TO LEAVE A MESSAGE FOR THE NURSE SELECT OPTION 2, PLEASE LEAVE A MESSAGE INCLUDING: YOUR NAME DATE OF BIRTH CALL BACK NUMBER REASON FOR CALL**this is  important as we prioritize the call backs  YOU WILL RECEIVE A CALL BACK THE SAME DAY AS LONG AS YOU CALL BEFORE 4:00 PM

## 2024-02-21 NOTE — Addendum Note (Signed)
 Encounter addended by: Buell Powell HERO, RN on: 02/21/2024 10:19 AM  Actions taken: Clinical Note Signed, Visit diagnoses modified, Order list changed, Diagnosis association updated

## 2024-02-21 NOTE — Progress Notes (Signed)
 "  CARDIO-ONCOLOGY CLINIC NOTE  Referring Physician: Dr. Odean Primary Care: [name], MD Primary Cardiologist: Dr. Court  HPI:  Pamela [name] is 58 y.o. female with obesity, HTN, HL and right breast cancer  referred by Dr. Odean  for enrollment into the Cardio-Oncology program.   Malignant neoplasm of lower-outer quadrant of right breast of female, estrogen receptor positive (HCC)  05/03/2020 Initial Diagnosis    Screening mammogram showed a 1.0cm mass at the 6 o'clock position in the right breast. Diagnostic mammogram and US  showed the 1.2cm mass at the 6 o'clock position in the right breast. Biopsy showed IDC, grade 3, HER-2 positive (3+), ER 15% weak, PR-, Ki67 80%.    05/11/2020 Cancer Staging    Staging form: Breast, AJCC 8th Edition - Clinical stage from 05/11/2020: Stage IA (cT1b, cN0, cM0, G3, ER+, PR-, HER2+) - Signed by Odean Potts, MD on 05/11/2020 Stage prefix: Initial diagnosis    05/19/2020 Genetic Testing    Negative hereditary cancer genetic testing: no pathogenic variants detected in [facility] Breast STAT Panel and Common Hereditary Cancers Panel.  The report dates are May 19, 2020 (STAT) and May 23, 2020 (Common Hereditary).    The Common Hereditary Cancers Panel offered by [facility] includes sequencing and/or deletion duplication testing of the following 47 genes: APC, ATM, AXIN2, BARD1, BMPR1A, BRCA1, BRCA2, BRIP1, CDH1, CDK4, CDKN2A (p14ARF), CDKN2A (p16INK4a), CHEK2, CTNNA1, DICER1, EPCAM (Deletion/duplication testing only), GREM1 (promoter region deletion/duplication testing only), GREM1, HOXB13, KIT, MEN1, MLH1, MSH2, MSH3, MSH6, MUTYH, NBN, NF1, NHTL1, PALB2, PDGFRA, PMS2, POLD1, POLE, PTEN, RAD50, RAD51C, RAD51D, SDHA, SDHB, SDHC, SDHD, SMAD4, SMARCA4. STK11, TP53, TSC1, TSC2, and VHL.  The following genes were evaluated for sequence changes only: SDHA and HOXB13 c.251G>A variant only.es only: SDHA and HOXB13 c.251G>A variant only.    06/02/2020 Surgery    Right  lumpectomy ([facility]): invasive and in situ ductal carcinoma, 1.3cm, clear margins, 1 right axillary lymph node negative for carcinoma.    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 Denies known heart disease.  Diagnosed with HER-2+ breast CA in 3/22. Treated with lumpectomy in 3/22. Then Taxol /Herceptin  12 rounds from 5/22-8/22. Now on Herceptin  q 3 weeks. Finished XRT in 10/22.   Finished Herceptin  07/08/21. Found to have recurrent disease in lymph node in her neck. Enhertu  started on 08/21/21  Here for routine f/u with echo. Remains on Ehhertu every 4 weeks.   Echo 04/11/23 EF 60-65% GLS in 2-chamber (only accurate) -18.5% Echo 08/26/23: EF 60-65% mod LVH GLS -156.5 (underestimated) Personally reviewed   Here for routine f/u. Remains on EnHertu . Feels ok. Under a lot of stress lately. Easily fatigued. Not sleeping well. Having knee pain after she fell and tore her meniscus. No edema, orthopnea or PND. Still has not done sleep study.   Echo today 02/21/24: EF 60-65% GLSS 15.7% Personally reviewed     Past Medical History:  Diagnosis Date   Anxiety    Asthma    Back pain    Breast  cancer [facility])    Constipation    DUB (dysfunctional uterine bleeding) 2007   Edema of both lower extremities    Elevated cholesterol    Family history of breast cancer 05/12/2020   Family history of lung cancer 05/12/2020   Food allergy    Grade I diastolic dysfunction 04/30/2023   H/O menorrhagia 05/01/2006   High cholesterol    History of ovarian cyst 2007    Hypertension 03/31/2005   Increased BMI 07/11/2006   Joint pain    Migraine    N&V (nausea and vomiting)    Obesity 2007   Obstructive sleep apnea syndrome, moderate 10/27/2013   no cpap   Personal history of chemotherapy    Personal history of radiation therapy    Sleep apnea    SOB (shortness of breath)    Vitamin D  deficiency    Vitamin D  deficiency    Weight loss 07/11/2006    Current Outpatient Medications  Medication Sig Dispense Refill   acetaminophen  (TYLENOL ) 500 MG tablet Take 1 tablet (500 mg total) by mouth every 6 (six) hours as needed. 30 tablet 0   albuterol  (VENTOLIN  HFA) 108 (90 Base) MCG/ACT inhaler Inhale 2 puffs into the lungs every 6 (six) hours as needed for wheezing or shortness of breath. 8 g 2   aspirin  EC 81 MG tablet Take 1 tablet (81 mg total) by mouth daily. Swallow whole. 30 tablet 11   escitalopram (LEXAPRO) 10 MG tablet Take 10 mg by mouth daily. Take one tablet by mouth once daily.     furosemide  (LASIX ) 20 MG tablet Take 1 tablet (20 mg total) by mouth as needed. TAKE 1 TABLET AS NEEDED FOR SWELLING OR SHORTNESS OF BREATH OR WEIGHT GAIN. 3 POUNDS WITHIN 24HR OR 5 POUNDS 7 DAYS 90 tablet 0   HYDROcodone -acetaminophen  (NORCO/VICODIN) 5-325 MG tablet Take 1 tablet by mouth.     levonorgestrel  (MIRENA , 52 MG,) 20 MCG/DAY IUD 1 each by Intrauterine route once.     lidocaine -prilocaine  (EMLA ) cream Apply to affected area once AS DIRECTED 30 g 3   linaclotide  (LINZESS ) 145 MCG CAPS capsule One cap po qd     LORazepam  (ATIVAN ) 0.5 MG tablet Take 0.5 mg by mouth daily. Take one tablet by mouth once daily as needed.     meloxicam  (MOBIC ) 7.5 MG tablet Take 1 tablet (7.5 mg total) by mouth daily. 30 tablet 3   phenol (CHLORASEPTIC) 1.4 % LIQD Use as directed 1 spray in the mouth or throat as needed for throat irritation / pain. 177 mL 0   potassium chloride  (KLOR-CON  M) 10 MEQ tablet Take 2 tablets (20 mEq total) by mouth 2 (two) times daily. 60 tablet 6    rosuvastatin  (CRESTOR ) 40 MG tablet Take 1 tablet (40 mg total) by mouth daily. 90 tablet 3   semaglutide -weight management (WEGOVY ) 0.5 MG/0.5ML SOAJ SQ injection Inject 0.5 mg into the skin once a week. 2 mL 0   Spacer/Aero-Holding Chambers (AEROCHAMBER PLUS) Device Please dispense spacer for inhaler use 1 each 0   spironolactone  (ALDACTONE ) 25 MG tablet Take 1 tablet (25 mg total) by mouth daily. 30 tablet 6   valsartan  (DIOVAN ) 160 MG tablet Take 1 tablet (160 mg total) by mouth daily. 30 tablet 11   venlafaxine  XR (EFFEXOR -XR) 37.5 MG 24 hr capsule Take 37.5 mg by mouth every morning.     No current facility-administered medications for this encounter.   Facility-Administered Medications Ordered in Other Encounters  Medication Dose Route  Frequency Provider Last Rate Last Admin   heparin  lock flush 100 unit/mL  500 Units Intracatheter Once Odean Potts, MD       sodium chloride  flush (NS) 0.9 % injection 10 mL  10 mL Intracatheter Once Odean Potts, MD        Allergies  Allergen Reactions   Pollen Extract Shortness Of Breath   Compazine  [Prochlorperazine ] Anxiety    IV compazine  causes severe anxiety attack   Latex Rash   Codeine Hives   Diflucan  [Fluconazole ] Hives   Shellfish Allergy Hives   Betadine [Povidone Iodine] Rash      Social History   Socioeconomic History   Marital status: Single    Spouse name: Not on file   Number of children: Not on file   Years of education: Not on file   Highest education level: Not on file  Occupational History   Occupation: Child Psychotherapist  Tobacco Use   Smoking status: Never   Smokeless tobacco: Never  Vaping Use   Vaping status: Never Used  Substance and Sexual Activity   Alcohol use: No   Drug use: Not Currently    Frequency: 2.0 times per week   Sexual activity: Not Currently    Birth control/protection: I.U.D.    Comment: Mirena   Other Topics Concern   Not on file  Social History Narrative   Not on file   Social  Drivers of Health   Tobacco Use: Low Risk (02/19/2024)   Patient History    Smoking Tobacco Use: Never    Smokeless Tobacco Use: Never    Passive Exposure: Not on file  Financial Resource Strain: Low Risk (11/28/2023)   Overall Financial Resource Strain (CARDIA)    Difficulty of Paying Living Expenses: Not hard at all  Food Insecurity: No Food Insecurity (11/28/2023)   Epic    Worried About Radiation Protection Practitioner of Food in the Last Year: Never true    Ran Out of Food in the Last Year: Never true  Transportation Needs: No Transportation Needs (11/28/2023)   Epic    Lack of Transportation (Medical): No    Lack of Transportation (Non-Medical): No  Physical Activity: Not on file  Stress: Not on file  Social Connections: Moderately Isolated (04/30/2023)   Social Connection and Isolation Panel    Frequency of Communication with Friends and Family: More than three times a week    Frequency of Social Gatherings with Friends and Family: More than three times a week    Attends Religious Services: More than 4 times per year    Active Member of Clubs or Organizations: No    Attends Banker Meetings: Not on file    Marital Status: Separated  Intimate Partner Violence: Not At Risk (11/28/2023)   Epic    Fear of Current or Ex-Partner: No    Emotionally Abused: No    Physically Abused: No    Sexually Abused: No  Depression (PHQ2-9): Low Risk (02/19/2024)   Depression (PHQ2-9)    PHQ-2 Score: 0  Recent Concern: Depression (PHQ2-9) - Medium Risk (01/22/2024)   Depression (PHQ2-9)    PHQ-2 Score: 9  Alcohol Screen: Not on file  Housing: Unknown (11/28/2023)   Epic    Unable to Pay for Housing in the Last Year: No    Number of Times Moved in the Last Year: Not on file    Homeless in the Last Year: No  Utilities: Not At Risk (11/28/2023)   Epic    Threatened with loss  of utilities: No  Health Literacy: Not on file      Family History  Problem Relation Age of Onset   Hypertension Mother     High Cholesterol Mother    Thyroid  disease Mother    Cancer Mother    Sleep apnea Mother    Hypertension Father    Heart attack Father    Sudden death Father    Diabetes Maternal Grandmother    Bone cancer Maternal Grandfather        dx after 43   Breast cancer Maternal Aunt        dx 30s   Lung cancer Maternal Aunt        dx after 50    There were no vitals filed for this visit.      Wt Readings from Last 3 Encounters:  02/19/24 (!) 148.8 kg (328 lb 1.6 oz)  02/06/24 (!) 146.5 kg (323 lb)  01/22/24 (!) 147.4 kg (325 lb)    PHYSICAL EXAM: General:  Sitting up in chair. No resp difficulty HEENT: normal Neck: supple. no JVD.  Cor: Regular rate & rhythm. No rubs, gallops or murmurs. Lungs: clear Abdomen: obese soft, nontender, nondistended.Good bowel sounds. Extremities: no cyanosis, clubbing, rash, tr edema Neuro: alert & orientedx3, cranial nerves grossly intact. moves all 4 extremities w/o difficulty. Affect pleasant   ASSESSMENT & PLAN:  1. Right Breast Cancer, stage IV - HER2 + - Treated with lumpectomy in 3/22. Then Taxol /Herceptin  12 rounds from 5/22-8/22. Now on Herceptin  q 3 weeks. Also about to finish XRT.  - Echo 10/22 EF 60-65% GLS reported as -13.3%.(inaccurate due to poor endocardial tracking) - Finished Herceptin  07/08/21. Found to have recurrent disease in lymph node in her neck. Enhertu  started on 08/21/21.  - Echo 04/11/23 EF 60-65% GLS in 2-chamber (only accurate) -18.5%   - Given stability with change echos to every 4 months while on EnHertu  - Echo 08/26/23: EF 60-65% mod LVH GLS -15.5 (underestimated) - Echo today 02/21/24: EF 60-65% GLSS 15.7% Personally reviewed - Continue EnHertu  with surveillance echos q4 months  2. HTN with LVH on echo  - Blood pressure remains elevated. - She is following closely with PCP - Home sleep study ordered (and she has device at home) but has not used it - I offered her referral to HTN Clinic and she will get back  to me   3. Snoring/daytime somnolence - we prescribed HST but she never completed despite multiple reminders  4. Morbid obesity - has enrolled in weight loss clinic - There is no height or weight on file to calculate BMI. - Insurance would not pay for [id number] - Per PCP  5. Hypokalemia - continue spiro - followed by PCP and Oncology  Toribio Fuel, MD  9:51 AM "

## 2024-02-21 NOTE — Assessment & Plan Note (Signed)
 06/02/2020:Right lumpectomy ([facility]): invasive and in situ ductal carcinoma, 1.3cm, clear margins, 1 right axillary lymph node negative for carcinoma. ER 15% weak, PR-,HER-2 positive (3+) Ki67 80%.   Treatment plan: 1.  Adjuvant chemotherapy with Taxol  Herceptin  followed by Herceptin  maintenance.  Stopped after 11 cycles of Taxol  Herceptin  maintenance completed 07/07/2021  2.  Adjuvant radiation therapy 11/23/2020-12/19/2020 3.  Adjuvant antiestrogen therapy with tamoxifen  started October 2022 4.  CT angiogram 07/13/2021: Right supraclavicular lymph node 5 cm, right internal mammary lymph nodes 1.8 cm 5.  Metastatic breast cancer: June 2023:Right supraclavicular lymphadenopathy: Biopsy: Metastatic poorly differentiated adenocarcinoma, ER +40%, PR negative, HER2 positive 3+ PET CT scan 08/06/21: Ext Right Supra clav LN along with Internal mammary and Rt Upper Mediastinal and Rt Axillary LN  CT CAP 06/10/2023: Small supraclavicular lymph node on the left 7 mm, right axillary nodular thickening 1.5 cm overall stable disease, and remained in lung infiltrates. Echocardiogram based upon Dr. Nelle recommendation. -----------------------------------------------------------------------------------------------------  Current treatment: Enhertu  started on 08/21/2021 cycle 36, now every 5 weeks Enhertu  toxicities:   Hospitalization 04/29/2023-05/04/2023: Community-acquired pneumonia Chemo-induced peripheral neuropathy Bilirubin elevation being monitored Hypertension issues Swelling of hands and feet possibly related to hypertension   Patient fell at work and is seriously injured her shoulder as well as bilateral knees. She is unable to sit or stand for any length of time. 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   CT CAP 10/28/23: No evidence of lymphadenopathy or metastatic disease   Return to clinic every 5 weeks for Enhertu 

## 2024-02-26 DIAGNOSIS — J029 Acute pharyngitis, unspecified: Secondary | ICD-10-CM | POA: Insufficient documentation

## 2024-02-26 NOTE — Assessment & Plan Note (Signed)
Negative Strep test  

## 2024-02-26 NOTE — Assessment & Plan Note (Signed)
 She is encouraged to strive for BMI less than 30 to decrease cardiac risk. Advised to aim for at least 150 minutes of exercise per week.

## 2024-03-10 ENCOUNTER — Encounter: Payer: Self-pay | Admitting: Hematology and Oncology

## 2024-03-10 NOTE — Progress Notes (Signed)
 Pt called inquiring about additional resources for assistance.  I informed her that because she had the [employer] grant in the past unfortunately she was unable to reapply at this time and advised her to contact her child psychotherapist for possible additional assistance.  She verbalized understanding and thanked me for returning her call.

## 2024-03-18 ENCOUNTER — Telehealth: Payer: Self-pay

## 2024-03-18 NOTE — Telephone Encounter (Signed)
 Spoke with pt regarding her Disability form being completed. I emailed the pt her copy.

## 2024-03-20 ENCOUNTER — Encounter: Payer: Self-pay | Admitting: Licensed Clinical Social Worker

## 2024-03-20 NOTE — Progress Notes (Signed)
 [facility] CSW Progress Note  Clinical Child Psychotherapist received e-mail request from patient inquiring about financial assistance as she was terminated from her job after [employer] ended and her SSDI has not started yet (will receive in May 2026).     Interventions: Referred patient to community resources: [employer]  Provided patient with information about [employer], Fishin for a Cure, Living Beyond Breast Cancer, and Pink Fund    Patient has already received [employer] within the last year, so is not eligible to re-apply yet.    Follow Up Plan:  Patient will work on applications and contact CSW with any questions    Lacie Landry E Kieon Lawhorn, LCSW Clinical Social Worker [facility]

## 2024-03-23 ENCOUNTER — Encounter: Payer: Self-pay | Admitting: Licensed Clinical Social Worker

## 2024-03-23 NOTE — Progress Notes (Signed)
 [facility] CSW Progress Note  Clinical Social Worker sent requested documents to patient for her Fishin' for a Cure application.       Jenin Birdsall E Summerlyn Fickel, LCSW Clinical Social Worker [facility]

## 2024-03-24 MED FILL — Fosaprepitant Dimeglumine For IV Infusion 150 MG (Base Eq): INTRAVENOUS | Qty: 5 | Status: AC

## 2024-03-24 NOTE — Assessment & Plan Note (Signed)
 06/02/2020:Right lumpectomy ([facility]): invasive and in situ ductal carcinoma, 1.3cm, clear margins, 1 right axillary lymph node negative for carcinoma. ER 15% weak, PR-,HER-2 positive (3+) Ki67 80%.   Treatment plan: 1.  Adjuvant chemotherapy with Taxol  Herceptin  followed by Herceptin  maintenance.  Stopped after 11 cycles of Taxol  Herceptin  maintenance completed 07/07/2021  2.  Adjuvant radiation therapy 11/23/2020-12/19/2020 3.  Adjuvant antiestrogen therapy with tamoxifen  started October 2022 4.  CT angiogram 07/13/2021: Right supraclavicular lymph node 5 cm, right internal mammary lymph nodes 1.8 cm 5.  Metastatic breast cancer: June 2023:Right supraclavicular lymphadenopathy: Biopsy: Metastatic poorly differentiated adenocarcinoma, ER +40%, PR negative, HER2 positive 3+ PET CT scan 08/06/21: Ext Right Supra clav LN along with Internal mammary and Rt Upper Mediastinal and Rt Axillary LN  CT CAP 06/10/2023: Small supraclavicular lymph node on the left 7 mm, right axillary nodular thickening 1.5 cm overall stable disease, and remained in lung infiltrates. Echocardiogram based upon Dr. Nelle recommendation. -----------------------------------------------------------------------------------------------------  Current treatment: Enhertu  started on 08/21/2021 cycle 38, now every 5 weeks Enhertu  toxicities:   Hospitalization 04/29/2023-05/04/2023: Community-acquired pneumonia Chemo-induced peripheral neuropathy Bilirubin elevated at 1.9, up from 1.4 and 1.7  Hypertension issues Swelling of hands and feet possibly related to hypertension   Patient fell at work and is seriously injured her shoulder as well as bilateral knees. She is unable to sit or stand for any length of time. 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   She is unable to work both because of the recent injuries but also secondary to her metastatic cancer and side effects of  chemotherapy.  She also has profound anxiety and mental health issues that complicate her ability to work.   CT CAP 10/28/23: No evidence of lymphadenopathy or metastatic disease   Return to clinic every 5 weeks for Enhertu 

## 2024-03-25 ENCOUNTER — Inpatient Hospital Stay: Attending: Hematology and Oncology

## 2024-03-25 ENCOUNTER — Encounter: Payer: Self-pay | Admitting: *Deleted

## 2024-03-25 ENCOUNTER — Inpatient Hospital Stay: Admitting: Hematology and Oncology

## 2024-03-25 ENCOUNTER — Inpatient Hospital Stay

## 2024-03-25 ENCOUNTER — Inpatient Hospital Stay: Admitting: Licensed Clinical Social Worker

## 2024-03-25 VITALS — BP 169/79 | HR 85 | Temp 98.0°F | Resp 18 | Ht 68.0 in | Wt 317.7 lb

## 2024-03-25 DIAGNOSIS — Z5112 Encounter for antineoplastic immunotherapy: Secondary | ICD-10-CM | POA: Diagnosis present

## 2024-03-25 DIAGNOSIS — C771 Secondary and unspecified malignant neoplasm of intrathoracic lymph nodes: Secondary | ICD-10-CM | POA: Diagnosis not present

## 2024-03-25 DIAGNOSIS — R5383 Other fatigue: Secondary | ICD-10-CM | POA: Insufficient documentation

## 2024-03-25 DIAGNOSIS — Z170 Estrogen receptor positive status [ER+]: Secondary | ICD-10-CM

## 2024-03-25 DIAGNOSIS — M25519 Pain in unspecified shoulder: Secondary | ICD-10-CM | POA: Diagnosis not present

## 2024-03-25 DIAGNOSIS — C50919 Malignant neoplasm of unspecified site of unspecified female breast: Secondary | ICD-10-CM

## 2024-03-25 DIAGNOSIS — Z1731 Human epidermal growth factor receptor 2 positive status: Secondary | ICD-10-CM | POA: Insufficient documentation

## 2024-03-25 DIAGNOSIS — S83209A Unspecified tear of unspecified meniscus, current injury, unspecified knee, initial encounter: Secondary | ICD-10-CM | POA: Diagnosis not present

## 2024-03-25 DIAGNOSIS — F32A Depression, unspecified: Secondary | ICD-10-CM | POA: Diagnosis not present

## 2024-03-25 DIAGNOSIS — Z1722 Progesterone receptor negative status: Secondary | ICD-10-CM | POA: Diagnosis not present

## 2024-03-25 DIAGNOSIS — G4700 Insomnia, unspecified: Secondary | ICD-10-CM | POA: Diagnosis not present

## 2024-03-25 DIAGNOSIS — Z888 Allergy status to other drugs, medicaments and biological substances status: Secondary | ICD-10-CM | POA: Diagnosis not present

## 2024-03-25 DIAGNOSIS — Z7982 Long term (current) use of aspirin: Secondary | ICD-10-CM | POA: Diagnosis not present

## 2024-03-25 DIAGNOSIS — I700 Atherosclerosis of aorta: Secondary | ICD-10-CM | POA: Insufficient documentation

## 2024-03-25 DIAGNOSIS — Z883 Allergy status to other anti-infective agents status: Secondary | ICD-10-CM | POA: Insufficient documentation

## 2024-03-25 DIAGNOSIS — C50511 Malignant neoplasm of lower-outer quadrant of right female breast: Secondary | ICD-10-CM

## 2024-03-25 DIAGNOSIS — R42 Dizziness and giddiness: Secondary | ICD-10-CM | POA: Diagnosis not present

## 2024-03-25 DIAGNOSIS — R110 Nausea: Secondary | ICD-10-CM | POA: Diagnosis not present

## 2024-03-25 DIAGNOSIS — Z885 Allergy status to narcotic agent status: Secondary | ICD-10-CM | POA: Diagnosis not present

## 2024-03-25 DIAGNOSIS — R634 Abnormal weight loss: Secondary | ICD-10-CM | POA: Diagnosis not present

## 2024-03-25 DIAGNOSIS — F419 Anxiety disorder, unspecified: Secondary | ICD-10-CM | POA: Diagnosis not present

## 2024-03-25 DIAGNOSIS — Z79899 Other long term (current) drug therapy: Secondary | ICD-10-CM | POA: Diagnosis not present

## 2024-03-25 DIAGNOSIS — T451X5A Adverse effect of antineoplastic and immunosuppressive drugs, initial encounter: Secondary | ICD-10-CM | POA: Diagnosis not present

## 2024-03-25 DIAGNOSIS — K5900 Constipation, unspecified: Secondary | ICD-10-CM | POA: Insufficient documentation

## 2024-03-25 DIAGNOSIS — G8929 Other chronic pain: Secondary | ICD-10-CM | POA: Insufficient documentation

## 2024-03-25 DIAGNOSIS — Z91040 Latex allergy status: Secondary | ICD-10-CM | POA: Insufficient documentation

## 2024-03-25 DIAGNOSIS — Z95828 Presence of other vascular implants and grafts: Secondary | ICD-10-CM

## 2024-03-25 LAB — CBC WITH DIFFERENTIAL (CANCER CENTER ONLY)
Abs Immature Granulocytes: 0.04 K/uL (ref 0.00–0.07)
Basophils Absolute: 0 K/uL (ref 0.0–0.1)
Basophils Relative: 1 %
Eosinophils Absolute: 0.3 K/uL (ref 0.0–0.5)
Eosinophils Relative: 5 %
HCT: 40.2 % (ref 36.0–46.0)
Hemoglobin: 13.1 g/dL (ref 12.0–15.0)
Immature Granulocytes: 1 %
Lymphocytes Relative: 21 %
Lymphs Abs: 1.4 K/uL (ref 0.7–4.0)
MCH: 27 pg (ref 26.0–34.0)
MCHC: 32.6 g/dL (ref 30.0–36.0)
MCV: 82.9 fL (ref 80.0–100.0)
Monocytes Absolute: 0.9 K/uL (ref 0.1–1.0)
Monocytes Relative: 13 %
Neutro Abs: 3.9 K/uL (ref 1.7–7.7)
Neutrophils Relative %: 59 %
Platelet Count: 168 K/uL (ref 150–400)
RBC: 4.85 MIL/uL (ref 3.87–5.11)
RDW: 16.2 % — ABNORMAL HIGH (ref 11.5–15.5)
WBC Count: 6.5 K/uL (ref 4.0–10.5)
nRBC: 0 % (ref 0.0–0.2)

## 2024-03-25 LAB — CMP (CANCER CENTER ONLY)
ALT: 28 U/L (ref 0–44)
AST: 44 U/L — ABNORMAL HIGH (ref 15–41)
Albumin: 3.7 g/dL (ref 3.5–5.0)
Alkaline Phosphatase: 130 U/L — ABNORMAL HIGH (ref 38–126)
Anion gap: 11 (ref 5–15)
BUN: 5 mg/dL — ABNORMAL LOW (ref 6–20)
CO2: 28 mmol/L (ref 22–32)
Calcium: 9.3 mg/dL (ref 8.9–10.3)
Chloride: 102 mmol/L (ref 98–111)
Creatinine: 0.82 mg/dL (ref 0.44–1.00)
GFR, Estimated: >60 mL/min
Glucose, Bld: 94 mg/dL (ref 70–99)
Potassium: 3.7 mmol/L (ref 3.5–5.1)
Sodium: 141 mmol/L (ref 135–145)
Total Bilirubin: 1.4 mg/dL — ABNORMAL HIGH (ref 0.0–1.2)
Total Protein: 6.9 g/dL (ref 6.5–8.1)

## 2024-03-25 MED ORDER — FAM-TRASTUZUMAB DERUXTECAN-NXKI CHEMO 100 MG IV SOLR
300.0000 mg | Freq: Once | INTRAVENOUS | Status: AC
  Administered 2024-03-25: 300 mg via INTRAVENOUS
  Filled 2024-03-25: qty 15

## 2024-03-25 MED ORDER — PALONOSETRON HCL INJECTION 0.25 MG/5ML
0.2500 mg | Freq: Once | INTRAVENOUS | Status: AC
  Administered 2024-03-25: 0.25 mg via INTRAVENOUS
  Filled 2024-03-25: qty 5

## 2024-03-25 MED ORDER — DEXTROSE 5 % IV SOLN: Freq: Once | INTRAVENOUS | Status: AC

## 2024-03-25 MED ORDER — CYANOCOBALAMIN 1000 MCG/ML IJ SOLN
1000.0000 ug | Freq: Once | INTRAMUSCULAR | Status: AC
  Administered 2024-03-25: 1000 ug via INTRAMUSCULAR
  Filled 2024-03-25: qty 1

## 2024-03-25 MED ORDER — ACETAMINOPHEN 325 MG PO TABS
650.0000 mg | ORAL_TABLET | Freq: Once | ORAL | Status: AC
  Administered 2024-03-25: 650 mg via ORAL
  Filled 2024-03-25: qty 2

## 2024-03-25 MED ORDER — SODIUM CHLORIDE 0.9 % IV SOLN
150.0000 mg | Freq: Once | INTRAVENOUS | Status: AC
  Administered 2024-03-25: 150 mg via INTRAVENOUS
  Filled 2024-03-25: qty 150

## 2024-03-25 NOTE — Progress Notes (Signed)
 [facility] CSW Progress Note  Visual Merchandiser met with patient to follow-up on financial concerns.    Interventions: Provided [employer] application and reviewed what supporting documents are needed with the application Submitted Pink Purse application on pt's behalf Reviewed additional applications and what is needed ([employer],  Fishin for a Cure)      Follow Up Plan:  Patient will send needed documents to CSW    Kuron Docken E Cristyn Crossno, LCSW Clinical Social Worker [facility]

## 2024-03-25 NOTE — Progress Notes (Signed)
 Received fax from [employer] unit for debt protection claim. MD filled out paperwork and RN successfully faxed to [phone].

## 2024-03-25 NOTE — Progress Notes (Signed)
 Per Dr. Cherrie with cardiology on 02/20/25: Echo today 02/21/24: EF 60-65% GLSS 15.7%  - Continue EnHertu  with surveillance echos q4 months.

## 2024-03-25 NOTE — Patient Instructions (Signed)
 [facility] - A DEPT OF [facility]  Discharge Instructions: Thank you for choosing [facility] to provide your oncology and hematology care.   If you have a lab appointment with the Cancer Center, please go directly to the Cancer Center and check in at the registration area.   Wear comfortable clothing and clothing appropriate for easy access to any Portacath or PICC line.   We strive to give you quality time with your provider. You may need to reschedule your appointment if you arrive late (15 or more minutes).  Arriving late affects you and other patients whose appointments are after yours.  Also, if you miss three or more appointments without notifying the office, you may be dismissed from the clinic at the provider's discretion.      For prescription refill requests, have your pharmacy contact our office and allow 72 hours for refills to be completed.    Today you received the following chemotherapy and/or immunotherapy agents Enhertu      To help prevent nausea and vomiting after your treatment, we encourage you to take your nausea medication as directed.  BELOW ARE SYMPTOMS THAT SHOULD BE REPORTED IMMEDIATELY: *FEVER GREATER THAN 100.4 F (38 C) OR HIGHER *CHILLS OR SWEATING *NAUSEA AND VOMITING THAT IS NOT CONTROLLED WITH YOUR NAUSEA MEDICATION *UNUSUAL SHORTNESS OF BREATH *UNUSUAL BRUISING OR BLEEDING *URINARY PROBLEMS (pain or burning when urinating, or frequent urination) *BOWEL PROBLEMS (unusual diarrhea, constipation, pain near the anus) TENDERNESS IN MOUTH AND THROAT WITH OR WITHOUT PRESENCE OF ULCERS (sore throat, sores in mouth, or a toothache) UNUSUAL RASH, SWELLING OR PAIN  UNUSUAL VAGINAL DISCHARGE OR ITCHING   Items with * indicate a potential emergency and should be followed up as soon as possible or go to the Emergency Department if any problems should occur.  Please show the CHEMOTHERAPY ALERT CARD or IMMUNOTHERAPY  ALERT CARD at check-in to the Emergency Department and triage nurse.  Should you have questions after your visit or need to cancel or reschedule your appointment, please contact [facility] - A DEPT OF [facility]  Dept: [phone]  and follow the prompts.  Office hours are 8:00 a.m. to 4:30 p.m. Monday - Friday. Please note that voicemails left after 4:00 p.m. may not be returned until the following business day.  We are closed weekends and major holidays. You have access to a nurse at all times for urgent questions. Please call the main number to the clinic Dept: [phone] and follow the prompts.   For any non-urgent questions, you may also contact your provider using MyChart. We now offer e-Visits for anyone 19 and older to request care online for non-urgent symptoms. For details visit [email or web address].   Also download the MyChart app! Go to the app store, search "MyChart", open the app, select [facility], and log in with your MyChart username and password.

## 2024-03-25 NOTE — Progress Notes (Signed)
 "  Patient Care Team: [name], MD as PCP - General (Internal Medicine) Vernetta Lonni GRADE, MD as Consulting Physician (Orthopedic Surgery) Tyree Nanetta SAILOR, RN as Oncology Nurse Navigator Vanderbilt Ned, MD as Consulting Physician (General Surgery) Odean Potts, MD as Consulting Physician (Hematology and Oncology) Izell Domino, MD as Attending Physician (Radiation Oncology)  DIAGNOSIS:  Encounter Diagnosis  Name Primary?   Malignant neoplasm of lower-outer quadrant of right breast of female, estrogen receptor positive (HCC) Yes    SUMMARY OF ONCOLOGIC HISTORY: Oncology History  Malignant neoplasm of lower-outer quadrant of right breast of female, estrogen receptor positive (HCC)  05/03/2020 Initial Diagnosis   Screening mammogram showed a 1.0cm mass at the 6 o'clock position in the right breast. Diagnostic mammogram and US  showed the 1.2cm mass at the 6 o'clock position in the right breast. Biopsy showed IDC, grade 3, HER-2 positive (3+), ER 15% weak, PR-, Ki67 80%.   05/19/2020 Genetic Testing   Negative hereditary cancer genetic testing: no pathogenic variants detected in [facility] Breast STAT Panel and Common Hereditary Cancers Panel.  The report dates are May 19, 2020 (STAT) and May 23, 2020 (Common Hereditary).   The Common Hereditary Cancers Panel offered by [facility] includes sequencing and/or deletion duplication testing of the following 47 genes: APC, ATM, AXIN2, BARD1, BMPR1A, BRCA1, BRCA2, BRIP1, CDH1, CDK4, CDKN2A (p14ARF), CDKN2A (p16INK4a), CHEK2, CTNNA1, DICER1, EPCAM (Deletion/duplication testing only), GREM1 (promoter region deletion/duplication testing only), GREM1, HOXB13, KIT, MEN1, MLH1, MSH2, MSH3, MSH6, MUTYH, NBN, NF1, NHTL1, PALB2, PDGFRA, PMS2, POLD1, POLE, PTEN, RAD50, RAD51C, RAD51D, SDHA, SDHB, SDHC, SDHD, SMAD4, SMARCA4. STK11, TP53, TSC1, TSC2, and VHL.  The following genes were evaluated for sequence changes only: SDHA and HOXB13 c.251G>A variant  only.es only: SDHA and HOXB13 c.251G>A variant only.   06/02/2020 Surgery   Right lumpectomy ([facility]): invasive and in situ ductal carcinoma, 1.3cm, clear margins, 1 right axillary lymph node negative for carcinoma.   06/02/2020 Cancer Staging   Staging form: Breast, AJCC 8th Edition - Pathologic stage from 06/02/2020: Stage IA (pT1c, pN0, cM0, G3, ER+, PR-, HER2+) - Signed by Crawford Morna Pickle, NP on 06/15/2020 Stage prefix: Initial diagnosis Histologic grading system: 3 grade system   07/08/2020 - 10/21/2020 Chemotherapy   Taxol  Herceptin  followed by Herceptin  maintenance   04/14/2021 -  Anti-estrogen oral therapy   Tamoxifen  10 mg   07/13/2021 Imaging   CT angiogram: Right supraclavicular lymph node 5 cm, right internal mammary lymph nodes 1.8 cm    08/06/2021 PET scan   PET CT scan: Ext Right Supra clav LN along with Internal mammary and Rt Upper Mediastinal and Rt Axillary LN   08/2021 Initial Biopsy   Right supraclavicular lymphadenopathy: Biopsy: Metastatic poorly differentiated adenocarcinoma, ER +40%, PR negative, HER2 positive 3+    08/21/2021 -  Chemotherapy   Enhertu  every 3 weeks    03/08/2022 Imaging   CT chest/abdomen/pelvis  1. No new or progressive findings in the chest, abdomen or pelvis. 2. Signs of RIGHT breast lumpectomy with areas of central fat necrosis unchanged from previous imaging. 3. Stable small lymph nodes throughout the chest, largest with area of soft tissue about surgical clips in the RIGHT axilla. 4. Stable appearance of thoracic inlet lymph nodes, not shown to be hypermetabolic on prior PET imaging. 5. IUD in-situ. 6. Aortic atherosclerosis.   08/21/2022 Imaging   Echocardiogram  -normal LVEF at 60-65%. -no left ventricular wall motion abnormalities  -normal left ventricular diastolic function  -mild left ventricular hypertrophy.  10/05/2022 Imaging   CT Chest, Abdomen, and Pelvis with contrast IMPRESSION: 1. Stable examination without  new or progressive finding in the chest, abdomen or pelvis to suggest recurrent or metastatic disease. 2. Stable tiny bilateral thoracic inlet lymph nodes. 3.  Aortic Atherosclerosis (ICD10-I70.0).     CHIEF COMPLIANT: Enhertu   HISTORY OF PRESENT ILLNESS:   History of Present Illness Pamela [name] is a 59 year old female with metastatic ER+, HER2+ invasive ductal carcinoma of the right breast who presents for oncology follow-up and management of chemotherapy-related toxicities.  She is on a 35-day cycle of Enhertu  for metastatic HER2+ breast cancer, with this schedule stable for the last four to five cycles. She has severe constipation, nausea, dizziness, fatigue, and persistent tiredness. She notes ongoing swelling and puffiness, mainly in the lower extremities, and is very focused on blood pressure control. She prefers to avoid IV fluids because of concern about worsening swelling. Recent labs show normalized white blood cell count, hemoglobin, and platelets, with normal kidney function and electrolytes and only minimally elevated liver enzymes. Her last imaging was on October 28, 2023.  She has persistent anxiety and insomnia before chemotherapy and says she cannot sleep the night before treatment. Depression and anxiety are worsened by chronic pain and cancer treatment side effects. She remains on her current antidepressant and anxiolytic regimen.  She has a work-related torn meniscus with chronic knee pain. A nerve block last week gave only transient relief, and she still has pain, swelling, and heaviness in the knee, as well as new shoulder pain under evaluation.  For weight management, she was prescribed Wegovy  by her primary care provider. She delayed further doses after feeling unwell when the first dose coincided with chemotherapy. She plans to restart after her next treatment and has had some recent weight loss with increased activity.       ALLERGIES:  is allergic to pollen  extract, compazine  [prochlorperazine ], latex, codeine, diflucan  [fluconazole ], shellfish allergy, and betadine [povidone iodine].  MEDICATIONS:  Current Outpatient Medications  Medication Sig Dispense Refill   acetaminophen  (TYLENOL ) 500 MG tablet Take 1 tablet (500 mg total) by mouth every 6 (six) hours as needed. 30 tablet 0   albuterol  (VENTOLIN  HFA) 108 (90 Base) MCG/ACT inhaler Inhale 2 puffs into the lungs every 6 (six) hours as needed for wheezing or shortness of breath. 8 g 2   aspirin  EC 81 MG tablet Take 1 tablet (81 mg total) by mouth daily. Swallow whole. 30 tablet 11   escitalopram (LEXAPRO) 10 MG tablet Take 10 mg by mouth daily. Take one tablet by mouth once daily.     furosemide  (LASIX ) 20 MG tablet Take 1 tablet (20 mg total) by mouth as needed. TAKE 1 TABLET AS NEEDED FOR SWELLING OR SHORTNESS OF BREATH OR WEIGHT GAIN. 3 POUNDS WITHIN 24HR OR 5 POUNDS 7 DAYS 90 tablet 0   HYDROcodone -acetaminophen  (NORCO/VICODIN) 5-325 MG tablet Take 1 tablet by mouth.     levonorgestrel  (MIRENA , 52 MG,) 20 MCG/DAY IUD 1 each by Intrauterine route once.     lidocaine -prilocaine  (EMLA ) cream Apply to affected area once AS DIRECTED 30 g 3   linaclotide  (LINZESS ) 145 MCG CAPS capsule One cap po qd     LORazepam  (ATIVAN ) 0.5 MG tablet Take 0.5 mg by mouth daily. Take one tablet by mouth once daily as needed.     meloxicam  (MOBIC ) 7.5 MG tablet Take 1 tablet (7.5 mg total) by mouth daily. 30 tablet 3   phenol (CHLORASEPTIC) 1.4 %  LIQD Use as directed 1 spray in the mouth or throat as needed for throat irritation / pain. 177 mL 0   potassium chloride  (KLOR-CON  M) 10 MEQ tablet Take 2 tablets (20 mEq total) by mouth 2 (two) times daily. 60 tablet 6   rosuvastatin  (CRESTOR ) 40 MG tablet Take 1 tablet (40 mg total) by mouth daily. 90 tablet 3   semaglutide -weight management (WEGOVY ) 0.5 MG/0.5ML SOAJ SQ injection Inject 0.5 mg into the skin once a week. 2 mL 0   Spacer/Aero-Holding Chambers (AEROCHAMBER  PLUS) Device Please dispense spacer for inhaler use 1 each 0   spironolactone  (ALDACTONE ) 25 MG tablet Take 1 tablet (25 mg total) by mouth daily. 30 tablet 6   valsartan  (DIOVAN ) 160 MG tablet Take 1 tablet (160 mg total) by mouth daily. 30 tablet 11   venlafaxine  XR (EFFEXOR -XR) 37.5 MG 24 hr capsule Take 37.5 mg by mouth every morning.     No current facility-administered medications for this visit.   Facility-Administered Medications Ordered in Other Visits  Medication Dose Route Frequency Provider Last Rate Last Admin   heparin  lock flush 100 unit/mL  500 Units Intracatheter Once Odean Potts, MD       sodium chloride  flush (NS) 0.9 % injection 10 mL  10 mL Intracatheter Once Odean Potts, MD        PHYSICAL EXAMINATION: ECOG PERFORMANCE STATUS: 1 - Symptomatic but completely ambulatory  Vitals:   03/25/24 0945  BP: (!) 169/79  Pulse: 85  Resp: 18  Temp: 98 F (36.7 C)  SpO2: 100%   Filed Weights   03/25/24 0945  Weight: (!) 317 lb 11.2 oz (144.1 kg)      LABORATORY DATA:  I have reviewed the data as listed    Latest Ref Rng & Units 02/19/2024    9:08 AM 01/15/2024    9:22 AM 12/11/2023    9:45 AM  CMP  Glucose 70 - 99 mg/dL 894  899  886   BUN 6 - 20 mg/dL 7  7  8    Creatinine 0.44 - 1.00 mg/dL 9.26  9.31  9.37   Sodium 135 - 145 mmol/L 141  140  140   Potassium 3.5 - 5.1 mmol/L 3.3  3.3  3.5   Chloride 98 - 111 mmol/L 106  106  109   CO2 22 - 32 mmol/L 26  29  27    Calcium  8.9 - 10.3 mg/dL 8.7  9.0  9.0   Total Protein 6.5 - 8.1 g/dL 6.5  6.6  6.6   Total Bilirubin 0.0 - 1.2 mg/dL 1.2  1.7  1.9   Alkaline Phos 38 - 126 U/L 116  113  104   AST 15 - 41 U/L 56  33  39   ALT 0 - 44 U/L 25  19  22      Lab Results  Component Value Date   WBC 6.5 03/25/2024   HGB 13.1 03/25/2024   HCT 40.2 03/25/2024   MCV 82.9 03/25/2024   PLT 168 03/25/2024   NEUTROABS 3.9 03/25/2024    ASSESSMENT & PLAN:  Malignant neoplasm of lower-outer quadrant of right breast  of female, estrogen receptor positive (HCC) 06/02/2020:Right lumpectomy ([facility]): invasive and in situ ductal carcinoma, 1.3cm, clear margins, 1 right axillary lymph node negative for carcinoma. ER 15% weak, PR-,HER-2 positive (3+) Ki67 80%.   Treatment plan: 1.  Adjuvant chemotherapy with Taxol  Herceptin  followed by Herceptin  maintenance.  Stopped after 11 cycles of Taxol  Herceptin  maintenance  completed 07/07/2021  2.  Adjuvant radiation therapy 11/23/2020-12/19/2020 3.  Adjuvant antiestrogen therapy with tamoxifen  started October 2022 4.  CT angiogram 07/13/2021: Right supraclavicular lymph node 5 cm, right internal mammary lymph nodes 1.8 cm 5.  Metastatic breast cancer: June 2023:Right supraclavicular lymphadenopathy: Biopsy: Metastatic poorly differentiated adenocarcinoma, ER +40%, PR negative, HER2 positive 3+ PET CT scan 08/06/21: Ext Right Supra clav LN along with Internal mammary and Rt Upper Mediastinal and Rt Axillary LN  CT CAP 06/10/2023: Small supraclavicular lymph node on the left 7 mm, right axillary nodular thickening 1.5 cm overall stable disease, and remained in lung infiltrates. Echocardiogram based upon Dr. Nelle recommendation. -----------------------------------------------------------------------------------------------------  Current treatment: Enhertu  started on 08/21/2021 cycle 38, now every 5 weeks Enhertu  toxicities:   Hospitalization 04/29/2023-05/04/2023: Community-acquired pneumonia Chemo-induced peripheral neuropathy Bilirubin elevated at 1.9, up from 1.4 and 1.7  Hypertension issues Swelling of hands and feet possibly related to hypertension   Patient fell at work and is seriously injured her shoulder as well as bilateral knees. She is unable to sit or stand for any length of time. 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   She is unable to work both because of the recent injuries but also secondary to her  metastatic cancer and side effects of chemotherapy.  She also has profound anxiety and mental health issues that complicate her ability to work.   CT CAP 10/28/23: No evidence of lymphadenopathy or metastatic disease   Return to clinic every 5 weeks for Enhertu     No orders of the defined types were placed in this encounter.  The patient has a good understanding of the overall plan. she agrees with it. she will call with any problems that may develop before the next visit here.  I personally spent a total of 30 minutes in the care of the patient today including preparing to see the patient, getting/reviewing separately obtained history, performing a medically appropriate exam/evaluation, counseling and educating, placing orders, referring and communicating with other health care professionals, documenting clinical information in the EHR, independently interpreting results, communicating results, and coordinating care.   Viinay K Tish Begin, MD 03/25/24    "

## 2024-04-02 ENCOUNTER — Ambulatory Visit (HOSPITAL_COMMUNITY)

## 2024-04-10 ENCOUNTER — Encounter: Payer: Self-pay | Admitting: Hematology and Oncology

## 2024-04-22 ENCOUNTER — Ambulatory Visit (HOSPITAL_COMMUNITY): Payer: Self-pay

## 2024-04-29 ENCOUNTER — Inpatient Hospital Stay: Payer: Self-pay

## 2024-04-29 ENCOUNTER — Inpatient Hospital Stay: Admitting: Physician Assistant

## 2024-04-29 ENCOUNTER — Inpatient Hospital Stay: Payer: Self-pay | Attending: Hematology and Oncology

## 2024-04-29 ENCOUNTER — Inpatient Hospital Stay: Admitting: Hematology and Oncology

## 2024-05-21 ENCOUNTER — Ambulatory Visit: Admitting: Internal Medicine

## 2024-06-03 ENCOUNTER — Inpatient Hospital Stay: Payer: Self-pay

## 2024-06-03 ENCOUNTER — Inpatient Hospital Stay: Payer: Self-pay | Admitting: Adult Health

## 2024-06-03 ENCOUNTER — Inpatient Hospital Stay: Payer: Self-pay | Attending: Hematology and Oncology

## 2024-06-15 ENCOUNTER — Other Ambulatory Visit (HOSPITAL_COMMUNITY)

## 2024-06-15 ENCOUNTER — Ambulatory Visit (HOSPITAL_COMMUNITY): Admitting: Internal Medicine

## 2024-07-23 ENCOUNTER — Encounter: Payer: Self-pay | Admitting: Internal Medicine
